# Patient Record
Sex: Female | Born: 1945 | Race: White | Hispanic: No | Smoking: Former smoker
Health system: Southern US, Community
[De-identification: ages and names within clinical notes are randomized; demographics above are authoritative.]

## PROBLEM LIST (undated history)

## (undated) DIAGNOSIS — R112 Nausea with vomiting, unspecified: Secondary | ICD-10-CM

## (undated) DIAGNOSIS — I10 Essential (primary) hypertension: Secondary | ICD-10-CM

## (undated) DIAGNOSIS — G4733 Obstructive sleep apnea (adult) (pediatric): Secondary | ICD-10-CM

## (undated) DIAGNOSIS — C50919 Malignant neoplasm of unspecified site of unspecified female breast: Secondary | ICD-10-CM

## (undated) DIAGNOSIS — I340 Nonrheumatic mitral (valve) insufficiency: Secondary | ICD-10-CM

## (undated) DIAGNOSIS — I499 Cardiac arrhythmia, unspecified: Secondary | ICD-10-CM

## (undated) DIAGNOSIS — K922 Gastrointestinal hemorrhage, unspecified: Secondary | ICD-10-CM

## (undated) DIAGNOSIS — E7800 Pure hypercholesterolemia, unspecified: Secondary | ICD-10-CM

## (undated) DIAGNOSIS — I4819 Other persistent atrial fibrillation: Secondary | ICD-10-CM

## (undated) DIAGNOSIS — M5136 Other intervertebral disc degeneration, lumbar region: Secondary | ICD-10-CM

## (undated) DIAGNOSIS — M51369 Other intervertebral disc degeneration, lumbar region without mention of lumbar back pain or lower extremity pain: Secondary | ICD-10-CM

## (undated) DIAGNOSIS — Z9889 Other specified postprocedural states: Secondary | ICD-10-CM

## (undated) DIAGNOSIS — G4700 Insomnia, unspecified: Secondary | ICD-10-CM

## (undated) DIAGNOSIS — M1990 Unspecified osteoarthritis, unspecified site: Secondary | ICD-10-CM

## (undated) DIAGNOSIS — K5730 Diverticulosis of large intestine without perforation or abscess without bleeding: Secondary | ICD-10-CM

## (undated) DIAGNOSIS — K219 Gastro-esophageal reflux disease without esophagitis: Secondary | ICD-10-CM

## (undated) DIAGNOSIS — E8881 Metabolic syndrome: Secondary | ICD-10-CM

## (undated) DIAGNOSIS — I350 Nonrheumatic aortic (valve) stenosis: Secondary | ICD-10-CM

## (undated) DIAGNOSIS — I6529 Occlusion and stenosis of unspecified carotid artery: Secondary | ICD-10-CM

## (undated) DIAGNOSIS — M545 Low back pain, unspecified: Secondary | ICD-10-CM

## (undated) DIAGNOSIS — D563 Thalassemia minor: Secondary | ICD-10-CM

## (undated) DIAGNOSIS — M2040 Other hammer toe(s) (acquired), unspecified foot: Secondary | ICD-10-CM

## (undated) DIAGNOSIS — Z923 Personal history of irradiation: Secondary | ICD-10-CM

## (undated) DIAGNOSIS — Z9989 Dependence on other enabling machines and devices: Secondary | ICD-10-CM

## (undated) DIAGNOSIS — D649 Anemia, unspecified: Secondary | ICD-10-CM

## (undated) DIAGNOSIS — E236 Other disorders of pituitary gland: Secondary | ICD-10-CM

## (undated) DIAGNOSIS — M8580 Other specified disorders of bone density and structure, unspecified site: Secondary | ICD-10-CM

## (undated) DIAGNOSIS — E559 Vitamin D deficiency, unspecified: Secondary | ICD-10-CM

## (undated) DIAGNOSIS — K589 Irritable bowel syndrome without diarrhea: Secondary | ICD-10-CM

## (undated) DIAGNOSIS — J189 Pneumonia, unspecified organism: Secondary | ICD-10-CM

## (undated) DIAGNOSIS — R5383 Other fatigue: Secondary | ICD-10-CM

## (undated) HISTORY — DX: Diverticulosis of large intestine without perforation or abscess without bleeding: K57.30

## (undated) HISTORY — DX: Other persistent atrial fibrillation: I48.19

## (undated) HISTORY — DX: Thalassemia minor: D56.3

## (undated) HISTORY — DX: Insomnia, unspecified: G47.00

## (undated) HISTORY — DX: Morbid (severe) obesity due to excess calories: E66.01

## (undated) HISTORY — PX: TUBAL LIGATION: SHX77

## (undated) HISTORY — PX: OOPHORECTOMY: SHX86

## (undated) HISTORY — DX: Occlusion and stenosis of unspecified carotid artery: I65.29

## (undated) HISTORY — DX: Low back pain, unspecified: M54.50

## (undated) HISTORY — DX: Pure hypercholesterolemia, unspecified: E78.00

## (undated) HISTORY — DX: Obstructive sleep apnea (adult) (pediatric): G47.33

## (undated) HISTORY — DX: Other fatigue: R53.83

## (undated) HISTORY — DX: Nonrheumatic mitral (valve) insufficiency: I34.0

## (undated) HISTORY — DX: Metabolic syndrome: E88.810

## (undated) HISTORY — DX: Gastrointestinal hemorrhage, unspecified: K92.2

## (undated) HISTORY — DX: Low back pain: M54.5

## (undated) HISTORY — DX: Vitamin D deficiency, unspecified: E55.9

## (undated) HISTORY — DX: Other disorders of pituitary gland: E23.6

## (undated) HISTORY — PX: TONSILLECTOMY: SUR1361

## (undated) HISTORY — PX: DILATION AND CURETTAGE OF UTERUS: SHX78

## (undated) HISTORY — DX: Other hammer toe(s) (acquired), unspecified foot: M20.40

## (undated) HISTORY — DX: Metabolic syndrome: E88.81

## (undated) HISTORY — PX: REPLACEMENT TOTAL KNEE: SUR1224

## (undated) HISTORY — DX: Dependence on other enabling machines and devices: Z99.89

## (undated) HISTORY — DX: Nonrheumatic aortic (valve) stenosis: I35.0

## (undated) HISTORY — DX: Other specified disorders of bone density and structure, unspecified site: M85.80

---

## 1968-08-25 HISTORY — PX: CYSTECTOMY: SUR359

## 1998-03-14 ENCOUNTER — Ambulatory Visit (HOSPITAL_COMMUNITY): Admission: RE | Admit: 1998-03-14 | Discharge: 1998-03-14 | Payer: Self-pay | Admitting: Obstetrics and Gynecology

## 1998-07-08 ENCOUNTER — Emergency Department (HOSPITAL_COMMUNITY): Admission: EM | Admit: 1998-07-08 | Discharge: 1998-07-08 | Payer: Self-pay | Admitting: Emergency Medicine

## 1998-07-09 ENCOUNTER — Encounter (HOSPITAL_COMMUNITY): Admission: RE | Admit: 1998-07-09 | Discharge: 1998-10-07 | Payer: Self-pay | Admitting: Psychiatry

## 1998-12-03 ENCOUNTER — Ambulatory Visit (HOSPITAL_COMMUNITY): Admission: RE | Admit: 1998-12-03 | Discharge: 1998-12-03 | Payer: Self-pay | Admitting: Internal Medicine

## 1998-12-03 ENCOUNTER — Encounter: Payer: Self-pay | Admitting: Internal Medicine

## 1999-03-12 ENCOUNTER — Other Ambulatory Visit: Admission: RE | Admit: 1999-03-12 | Discharge: 1999-03-12 | Payer: Self-pay | Admitting: Obstetrics and Gynecology

## 1999-03-18 ENCOUNTER — Encounter: Payer: Self-pay | Admitting: Obstetrics and Gynecology

## 1999-03-18 ENCOUNTER — Ambulatory Visit (HOSPITAL_COMMUNITY): Admission: RE | Admit: 1999-03-18 | Discharge: 1999-03-18 | Payer: Self-pay | Admitting: Obstetrics and Gynecology

## 1999-06-24 ENCOUNTER — Encounter: Admission: RE | Admit: 1999-06-24 | Discharge: 1999-06-24 | Payer: Self-pay | Admitting: Internal Medicine

## 1999-06-24 ENCOUNTER — Encounter: Payer: Self-pay | Admitting: Internal Medicine

## 1999-07-19 ENCOUNTER — Ambulatory Visit (HOSPITAL_COMMUNITY): Admission: RE | Admit: 1999-07-19 | Discharge: 1999-07-19 | Payer: Self-pay | Admitting: Orthopaedic Surgery

## 1999-07-19 ENCOUNTER — Encounter: Payer: Self-pay | Admitting: Orthopaedic Surgery

## 2000-03-11 ENCOUNTER — Other Ambulatory Visit: Admission: RE | Admit: 2000-03-11 | Discharge: 2000-03-11 | Payer: Self-pay | Admitting: Obstetrics and Gynecology

## 2000-03-19 ENCOUNTER — Encounter: Payer: Self-pay | Admitting: Obstetrics and Gynecology

## 2000-03-19 ENCOUNTER — Ambulatory Visit (HOSPITAL_COMMUNITY): Admission: RE | Admit: 2000-03-19 | Discharge: 2000-03-19 | Payer: Self-pay | Admitting: Obstetrics and Gynecology

## 2000-11-02 ENCOUNTER — Encounter: Payer: Self-pay | Admitting: Internal Medicine

## 2000-11-02 ENCOUNTER — Inpatient Hospital Stay (HOSPITAL_COMMUNITY): Admission: AD | Admit: 2000-11-02 | Discharge: 2000-11-04 | Payer: Self-pay | Admitting: Internal Medicine

## 2001-03-11 ENCOUNTER — Other Ambulatory Visit: Admission: RE | Admit: 2001-03-11 | Discharge: 2001-03-11 | Payer: Self-pay | Admitting: Obstetrics and Gynecology

## 2001-03-22 ENCOUNTER — Ambulatory Visit (HOSPITAL_COMMUNITY): Admission: RE | Admit: 2001-03-22 | Discharge: 2001-03-22 | Payer: Self-pay | Admitting: Obstetrics and Gynecology

## 2001-03-22 ENCOUNTER — Encounter: Payer: Self-pay | Admitting: Obstetrics and Gynecology

## 2002-03-23 ENCOUNTER — Ambulatory Visit (HOSPITAL_COMMUNITY): Admission: RE | Admit: 2002-03-23 | Discharge: 2002-03-23 | Payer: Self-pay | Admitting: Obstetrics and Gynecology

## 2002-03-23 ENCOUNTER — Encounter: Payer: Self-pay | Admitting: Obstetrics and Gynecology

## 2002-03-25 ENCOUNTER — Other Ambulatory Visit: Admission: RE | Admit: 2002-03-25 | Discharge: 2002-03-25 | Payer: Self-pay | Admitting: Obstetrics and Gynecology

## 2002-10-31 ENCOUNTER — Other Ambulatory Visit: Admission: RE | Admit: 2002-10-31 | Discharge: 2002-10-31 | Payer: Self-pay | Admitting: Obstetrics and Gynecology

## 2003-03-23 ENCOUNTER — Encounter: Payer: Self-pay | Admitting: Internal Medicine

## 2003-03-23 ENCOUNTER — Encounter: Admission: RE | Admit: 2003-03-23 | Discharge: 2003-03-23 | Payer: Self-pay | Admitting: Internal Medicine

## 2003-03-24 ENCOUNTER — Encounter: Payer: Self-pay | Admitting: Internal Medicine

## 2003-03-24 ENCOUNTER — Encounter: Admission: RE | Admit: 2003-03-24 | Discharge: 2003-03-24 | Payer: Self-pay | Admitting: Internal Medicine

## 2003-03-28 ENCOUNTER — Encounter: Payer: Self-pay | Admitting: Obstetrics and Gynecology

## 2003-03-28 ENCOUNTER — Ambulatory Visit (HOSPITAL_COMMUNITY): Admission: RE | Admit: 2003-03-28 | Discharge: 2003-03-28 | Payer: Self-pay | Admitting: Obstetrics and Gynecology

## 2003-04-13 ENCOUNTER — Encounter: Payer: Self-pay | Admitting: Internal Medicine

## 2003-04-13 ENCOUNTER — Encounter: Admission: RE | Admit: 2003-04-13 | Discharge: 2003-04-13 | Payer: Self-pay | Admitting: Internal Medicine

## 2003-04-21 ENCOUNTER — Other Ambulatory Visit: Admission: RE | Admit: 2003-04-21 | Discharge: 2003-04-21 | Payer: Self-pay | Admitting: Obstetrics and Gynecology

## 2003-05-03 ENCOUNTER — Encounter (INDEPENDENT_AMBULATORY_CARE_PROVIDER_SITE_OTHER): Payer: Self-pay | Admitting: Specialist

## 2003-05-03 ENCOUNTER — Ambulatory Visit (HOSPITAL_COMMUNITY): Admission: RE | Admit: 2003-05-03 | Discharge: 2003-05-03 | Payer: Self-pay | Admitting: Gastroenterology

## 2004-03-28 ENCOUNTER — Ambulatory Visit (HOSPITAL_COMMUNITY): Admission: RE | Admit: 2004-03-28 | Discharge: 2004-03-28 | Payer: Self-pay | Admitting: Obstetrics and Gynecology

## 2004-10-22 ENCOUNTER — Encounter: Admission: RE | Admit: 2004-10-22 | Discharge: 2004-10-22 | Payer: Self-pay | Admitting: Internal Medicine

## 2005-03-31 ENCOUNTER — Ambulatory Visit (HOSPITAL_COMMUNITY): Admission: RE | Admit: 2005-03-31 | Discharge: 2005-03-31 | Payer: Self-pay | Admitting: Internal Medicine

## 2006-04-02 ENCOUNTER — Ambulatory Visit (HOSPITAL_COMMUNITY): Admission: RE | Admit: 2006-04-02 | Discharge: 2006-04-02 | Payer: Self-pay | Admitting: Obstetrics and Gynecology

## 2007-04-05 ENCOUNTER — Ambulatory Visit (HOSPITAL_COMMUNITY): Admission: RE | Admit: 2007-04-05 | Discharge: 2007-04-05 | Payer: Self-pay | Admitting: Obstetrics and Gynecology

## 2007-10-22 ENCOUNTER — Encounter: Admission: RE | Admit: 2007-10-22 | Discharge: 2007-10-22 | Payer: Self-pay | Admitting: Internal Medicine

## 2008-04-26 ENCOUNTER — Ambulatory Visit (HOSPITAL_COMMUNITY): Admission: RE | Admit: 2008-04-26 | Discharge: 2008-04-26 | Payer: Self-pay | Admitting: Internal Medicine

## 2008-05-13 ENCOUNTER — Ambulatory Visit: Payer: Self-pay | Admitting: *Deleted

## 2008-05-13 ENCOUNTER — Observation Stay (HOSPITAL_COMMUNITY): Admission: EM | Admit: 2008-05-13 | Discharge: 2008-05-15 | Payer: Self-pay | Admitting: Emergency Medicine

## 2008-08-25 HISTORY — PX: BREAST SURGERY: SHX581

## 2008-09-21 ENCOUNTER — Other Ambulatory Visit: Admission: RE | Admit: 2008-09-21 | Discharge: 2008-09-21 | Payer: Self-pay | Admitting: Internal Medicine

## 2009-04-27 ENCOUNTER — Ambulatory Visit (HOSPITAL_COMMUNITY): Admission: RE | Admit: 2009-04-27 | Discharge: 2009-04-27 | Payer: Self-pay | Admitting: Internal Medicine

## 2009-05-03 ENCOUNTER — Encounter (INDEPENDENT_AMBULATORY_CARE_PROVIDER_SITE_OTHER): Payer: Self-pay | Admitting: Diagnostic Radiology

## 2009-05-03 ENCOUNTER — Encounter: Admission: RE | Admit: 2009-05-03 | Discharge: 2009-05-03 | Payer: Self-pay | Admitting: Internal Medicine

## 2009-05-11 ENCOUNTER — Encounter: Admission: RE | Admit: 2009-05-11 | Discharge: 2009-05-11 | Payer: Self-pay | Admitting: Internal Medicine

## 2009-05-15 ENCOUNTER — Ambulatory Visit: Admission: RE | Admit: 2009-05-15 | Discharge: 2009-06-25 | Payer: Self-pay | Admitting: Radiation Oncology

## 2009-05-24 ENCOUNTER — Encounter: Admission: RE | Admit: 2009-05-24 | Discharge: 2009-05-24 | Payer: Self-pay | Admitting: General Surgery

## 2009-05-28 ENCOUNTER — Encounter: Admission: RE | Admit: 2009-05-28 | Discharge: 2009-05-28 | Payer: Self-pay | Admitting: General Surgery

## 2009-05-28 ENCOUNTER — Encounter (INDEPENDENT_AMBULATORY_CARE_PROVIDER_SITE_OTHER): Payer: Self-pay | Admitting: General Surgery

## 2009-05-28 ENCOUNTER — Ambulatory Visit (HOSPITAL_BASED_OUTPATIENT_CLINIC_OR_DEPARTMENT_OTHER): Admission: RE | Admit: 2009-05-28 | Discharge: 2009-05-28 | Payer: Self-pay | Admitting: General Surgery

## 2009-05-28 HISTORY — PX: BREAST LUMPECTOMY: SHX2

## 2009-06-13 ENCOUNTER — Ambulatory Visit: Payer: Self-pay | Admitting: Oncology

## 2009-06-13 LAB — CBC WITH DIFFERENTIAL/PLATELET
BASO%: 0.7 % (ref 0.0–2.0)
Basophils Absolute: 0 10*3/uL (ref 0.0–0.1)
EOS%: 2.2 % (ref 0.0–7.0)
Eosinophils Absolute: 0.1 10*3/uL (ref 0.0–0.5)
HCT: 33.4 % — ABNORMAL LOW (ref 34.8–46.6)
HGB: 10.5 g/dL — ABNORMAL LOW (ref 11.6–15.9)
LYMPH%: 21.1 % (ref 14.0–49.7)
MCH: 19.7 pg — ABNORMAL LOW (ref 25.1–34.0)
MCHC: 31.5 g/dL (ref 31.5–36.0)
MCV: 62.5 fL — ABNORMAL LOW (ref 79.5–101.0)
MONO#: 0.3 10*3/uL (ref 0.1–0.9)
MONO%: 6 % (ref 0.0–14.0)
NEUT#: 4.1 10*3/uL (ref 1.5–6.5)
NEUT%: 70 % (ref 38.4–76.8)
Platelets: 219 10*3/uL (ref 145–400)
RBC: 5.35 10*6/uL (ref 3.70–5.45)
RDW: 16.1 % — ABNORMAL HIGH (ref 11.2–14.5)
WBC: 5.8 10*3/uL (ref 3.9–10.3)
lymph#: 1.2 10*3/uL (ref 0.9–3.3)

## 2009-06-13 LAB — COMPREHENSIVE METABOLIC PANEL
ALT: 14 U/L (ref 0–35)
AST: 11 U/L (ref 0–37)
Albumin: 4.5 g/dL (ref 3.5–5.2)
Alkaline Phosphatase: 85 U/L (ref 39–117)
BUN: 20 mg/dL (ref 6–23)
CO2: 23 mEq/L (ref 19–32)
Calcium: 9.2 mg/dL (ref 8.4–10.5)
Chloride: 105 mEq/L (ref 96–112)
Creatinine, Ser: 0.82 mg/dL (ref 0.40–1.20)
Glucose, Bld: 208 mg/dL — ABNORMAL HIGH (ref 70–99)
Potassium: 3.9 mEq/L (ref 3.5–5.3)
Sodium: 140 mEq/L (ref 135–145)
Total Bilirubin: 0.6 mg/dL (ref 0.3–1.2)
Total Protein: 7.1 g/dL (ref 6.0–8.3)

## 2009-06-13 LAB — LACTATE DEHYDROGENASE: LDH: 135 U/L (ref 94–250)

## 2009-06-13 LAB — CANCER ANTIGEN 27.29: CA 27.29: 25 U/mL (ref 0–39)

## 2009-06-13 LAB — VITAMIN D 25 HYDROXY (VIT D DEFICIENCY, FRACTURES): Vit D, 25-Hydroxy: 33 ng/mL (ref 30–89)

## 2009-06-15 ENCOUNTER — Ambulatory Visit (HOSPITAL_COMMUNITY): Admission: RE | Admit: 2009-06-15 | Discharge: 2009-06-15 | Payer: Self-pay | Admitting: Oncology

## 2009-07-16 ENCOUNTER — Ambulatory Visit: Payer: Self-pay | Admitting: Oncology

## 2009-07-20 LAB — MORPHOLOGY: PLT EST: ADEQUATE

## 2009-07-20 LAB — CBC & DIFF AND RETIC
BASO%: 0.6 % (ref 0.0–2.0)
Basophils Absolute: 0 10*3/uL (ref 0.0–0.1)
EOS%: 3.5 % (ref 0.0–7.0)
Eosinophils Absolute: 0.2 10*3/uL (ref 0.0–0.5)
HCT: 35.5 % (ref 34.8–46.6)
HGB: 10.9 g/dL — ABNORMAL LOW (ref 11.6–15.9)
Immature Retic Fract: 8.1 % (ref 0.00–10.70)
LYMPH%: 22.5 % (ref 14.0–49.7)
MCH: 19.3 pg — ABNORMAL LOW (ref 25.1–34.0)
MCHC: 30.7 g/dL — ABNORMAL LOW (ref 31.5–36.0)
MCV: 62.8 fL — ABNORMAL LOW (ref 79.5–101.0)
MONO#: 0.3 10*3/uL (ref 0.1–0.9)
MONO%: 4.3 % (ref 0.0–14.0)
NEUT#: 4.8 10*3/uL (ref 1.5–6.5)
NEUT%: 69.1 % (ref 38.4–76.8)
Platelets: 198 10*3/uL (ref 145–400)
RBC: 5.65 10*6/uL — ABNORMAL HIGH (ref 3.70–5.45)
RDW: 15.5 % — ABNORMAL HIGH (ref 11.2–14.5)
Retic %: 1.7 % — ABNORMAL HIGH (ref 0.50–1.50)
Retic Ct Abs: 96.05 10*3/uL — ABNORMAL HIGH (ref 18.30–72.70)
WBC: 6.9 10*3/uL (ref 3.9–10.3)
lymph#: 1.6 10*3/uL (ref 0.9–3.3)

## 2009-07-20 LAB — CHCC SMEAR

## 2009-07-24 LAB — HEMOGLOBINOPATHY EVALUATION
Hemoglobin Other: 0 % (ref 0.0–0.0)
Hgb A2 Quant: 5.4 % — ABNORMAL HIGH (ref 2.2–3.2)
Hgb A: 94.6 % — ABNORMAL LOW (ref 96.8–97.8)
Hgb F Quant: 0 % (ref 0.0–2.0)
Hgb S Quant: 0 % (ref 0.0–0.0)

## 2009-07-24 LAB — IRON AND TIBC
%SAT: 26 % (ref 20–55)
Iron: 87 ug/dL (ref 42–145)
TIBC: 336 ug/dL (ref 250–470)
UIBC: 249 ug/dL

## 2009-07-24 LAB — FERRITIN: Ferritin: 185 ng/mL (ref 10–291)

## 2009-08-13 ENCOUNTER — Ambulatory Visit (HOSPITAL_COMMUNITY): Admission: RE | Admit: 2009-08-13 | Discharge: 2009-08-13 | Payer: Self-pay | Admitting: Oncology

## 2009-08-15 ENCOUNTER — Ambulatory Visit: Payer: Self-pay | Admitting: Oncology

## 2009-08-20 LAB — CBC & DIFF AND RETIC
BASO%: 0.6 % (ref 0.0–2.0)
Basophils Absolute: 0 10*3/uL (ref 0.0–0.1)
EOS%: 6.8 % (ref 0.0–7.0)
Eosinophils Absolute: 0.5 10*3/uL (ref 0.0–0.5)
HCT: 35.3 % (ref 34.8–46.6)
HGB: 10.8 g/dL — ABNORMAL LOW (ref 11.6–15.9)
Immature Retic Fract: 12.2 % — ABNORMAL HIGH (ref 0.00–10.70)
LYMPH%: 27.1 % (ref 14.0–49.7)
MCH: 19.1 pg — ABNORMAL LOW (ref 25.1–34.0)
MCHC: 30.6 g/dL — ABNORMAL LOW (ref 31.5–36.0)
MCV: 62.4 fL — ABNORMAL LOW (ref 79.5–101.0)
MONO#: 0.3 10*3/uL (ref 0.1–0.9)
MONO%: 3.9 % (ref 0.0–14.0)
NEUT#: 4.1 10*3/uL (ref 1.5–6.5)
NEUT%: 61.6 % (ref 38.4–76.8)
Platelets: 195 10*3/uL (ref 145–400)
RBC: 5.66 10*6/uL — ABNORMAL HIGH (ref 3.70–5.45)
RDW: 16.1 % — ABNORMAL HIGH (ref 11.2–14.5)
Retic %: 1.87 % — ABNORMAL HIGH (ref 0.50–1.50)
Retic Ct Abs: 105.84 10*3/uL — ABNORMAL HIGH (ref 18.30–72.70)
WBC: 6.6 10*3/uL (ref 3.9–10.3)
lymph#: 1.8 10*3/uL (ref 0.9–3.3)

## 2009-08-21 LAB — VITAMIN B12: Vitamin B-12: 933 pg/mL — ABNORMAL HIGH (ref 211–911)

## 2009-08-21 LAB — TSH: TSH: 1.709 u[IU]/mL (ref 0.350–4.500)

## 2009-08-25 HISTORY — PX: REPLACEMENT TOTAL KNEE: SUR1224

## 2009-09-27 ENCOUNTER — Other Ambulatory Visit: Admission: RE | Admit: 2009-09-27 | Discharge: 2009-09-27 | Payer: Self-pay | Admitting: Internal Medicine

## 2009-10-06 ENCOUNTER — Encounter: Admission: RE | Admit: 2009-10-06 | Discharge: 2009-10-06 | Payer: Self-pay | Admitting: Neurosurgery

## 2009-11-16 ENCOUNTER — Ambulatory Visit: Payer: Self-pay | Admitting: Oncology

## 2009-11-20 LAB — COMPREHENSIVE METABOLIC PANEL
ALT: 34 U/L (ref 0–35)
AST: 23 U/L (ref 0–37)
Albumin: 4.5 g/dL (ref 3.5–5.2)
Alkaline Phosphatase: 128 U/L — ABNORMAL HIGH (ref 39–117)
BUN: 19 mg/dL (ref 6–23)
CO2: 20 mEq/L (ref 19–32)
Calcium: 10.4 mg/dL (ref 8.4–10.5)
Chloride: 100 mEq/L (ref 96–112)
Creatinine, Ser: 0.92 mg/dL (ref 0.40–1.20)
Glucose, Bld: 249 mg/dL — ABNORMAL HIGH (ref 70–99)
Potassium: 4.4 mEq/L (ref 3.5–5.3)
Sodium: 137 mEq/L (ref 135–145)
Total Bilirubin: 0.6 mg/dL (ref 0.3–1.2)
Total Protein: 7.5 g/dL (ref 6.0–8.3)

## 2009-11-20 LAB — CBC WITH DIFFERENTIAL/PLATELET
BASO%: 0.4 % (ref 0.0–2.0)
Basophils Absolute: 0 10*3/uL (ref 0.0–0.1)
EOS%: 5.7 % (ref 0.0–7.0)
Eosinophils Absolute: 0.4 10*3/uL (ref 0.0–0.5)
HCT: 38.9 % (ref 34.8–46.6)
HGB: 12.1 g/dL (ref 11.6–15.9)
LYMPH%: 22.2 % (ref 14.0–49.7)
MCH: 19.5 pg — ABNORMAL LOW (ref 25.1–34.0)
MCHC: 31.1 g/dL — ABNORMAL LOW (ref 31.5–36.0)
MCV: 62.8 fL — ABNORMAL LOW (ref 79.5–101.0)
MONO#: 0.4 10*3/uL (ref 0.1–0.9)
MONO%: 5.3 % (ref 0.0–14.0)
NEUT#: 4.6 10*3/uL (ref 1.5–6.5)
NEUT%: 66.4 % (ref 38.4–76.8)
Platelets: 198 10*3/uL (ref 145–400)
RBC: 6.19 10*6/uL — ABNORMAL HIGH (ref 3.70–5.45)
RDW: 16 % — ABNORMAL HIGH (ref 11.2–14.5)
WBC: 6.9 10*3/uL (ref 3.9–10.3)
lymph#: 1.5 10*3/uL (ref 0.9–3.3)

## 2009-11-20 LAB — VITAMIN D 25 HYDROXY (VIT D DEFICIENCY, FRACTURES): Vit D, 25-Hydroxy: 50 ng/mL (ref 30–89)

## 2009-11-20 LAB — LACTATE DEHYDROGENASE: LDH: 154 U/L (ref 94–250)

## 2009-12-27 ENCOUNTER — Inpatient Hospital Stay (HOSPITAL_COMMUNITY): Admission: RE | Admit: 2009-12-27 | Discharge: 2009-12-31 | Payer: Self-pay | Admitting: Orthopaedic Surgery

## 2010-01-25 ENCOUNTER — Ambulatory Visit: Payer: Self-pay | Admitting: Oncology

## 2010-01-29 LAB — COMPREHENSIVE METABOLIC PANEL
ALT: 19 U/L (ref 0–35)
AST: 17 U/L (ref 0–37)
Albumin: 4.9 g/dL (ref 3.5–5.2)
Alkaline Phosphatase: 106 U/L (ref 39–117)
BUN: 19 mg/dL (ref 6–23)
CO2: 22 mEq/L (ref 19–32)
Calcium: 10.1 mg/dL (ref 8.4–10.5)
Chloride: 105 mEq/L (ref 96–112)
Creatinine, Ser: 0.82 mg/dL (ref 0.40–1.20)
Glucose, Bld: 111 mg/dL — ABNORMAL HIGH (ref 70–99)
Potassium: 4.3 mEq/L (ref 3.5–5.3)
Sodium: 140 mEq/L (ref 135–145)
Total Bilirubin: 0.5 mg/dL (ref 0.3–1.2)
Total Protein: 7.3 g/dL (ref 6.0–8.3)

## 2010-01-29 LAB — CANCER ANTIGEN 27.29: CA 27.29: 17 U/mL (ref 0–39)

## 2010-01-29 LAB — CBC WITH DIFFERENTIAL/PLATELET
BASO%: 0.3 % (ref 0.0–2.0)
Basophils Absolute: 0 10*3/uL (ref 0.0–0.1)
EOS%: 1.2 % (ref 0.0–7.0)
Eosinophils Absolute: 0.1 10*3/uL (ref 0.0–0.5)
HCT: 33 % — ABNORMAL LOW (ref 34.8–46.6)
HGB: 10.5 g/dL — ABNORMAL LOW (ref 11.6–15.9)
LYMPH%: 26 % (ref 14.0–49.7)
MCH: 19.9 pg — ABNORMAL LOW (ref 25.1–34.0)
MCHC: 31.7 g/dL (ref 31.5–36.0)
MCV: 62.7 fL — ABNORMAL LOW (ref 79.5–101.0)
MONO#: 0.4 10*3/uL (ref 0.1–0.9)
MONO%: 7.4 % (ref 0.0–14.0)
NEUT#: 3.5 10*3/uL (ref 1.5–6.5)
NEUT%: 65.1 % (ref 38.4–76.8)
Platelets: 233 10*3/uL (ref 145–400)
RBC: 5.26 10*6/uL (ref 3.70–5.45)
RDW: 16.7 % — ABNORMAL HIGH (ref 11.2–14.5)
WBC: 5.4 10*3/uL (ref 3.9–10.3)
lymph#: 1.4 10*3/uL (ref 0.9–3.3)

## 2010-01-29 LAB — LACTATE DEHYDROGENASE: LDH: 147 U/L (ref 94–250)

## 2010-01-29 LAB — VITAMIN D 25 HYDROXY (VIT D DEFICIENCY, FRACTURES): Vit D, 25-Hydroxy: 65 ng/mL (ref 30–89)

## 2010-04-30 ENCOUNTER — Encounter: Admission: RE | Admit: 2010-04-30 | Discharge: 2010-04-30 | Payer: Self-pay | Admitting: Oncology

## 2010-05-17 ENCOUNTER — Ambulatory Visit: Payer: Self-pay | Admitting: Oncology

## 2010-05-21 LAB — CBC WITH DIFFERENTIAL/PLATELET
BASO%: 0.4 % (ref 0.0–2.0)
Basophils Absolute: 0 10*3/uL (ref 0.0–0.1)
EOS%: 10.2 % — ABNORMAL HIGH (ref 0.0–7.0)
Eosinophils Absolute: 0.8 10*3/uL — ABNORMAL HIGH (ref 0.0–0.5)
HCT: 30.1 % — ABNORMAL LOW (ref 34.8–46.6)
HGB: 9.7 g/dL — ABNORMAL LOW (ref 11.6–15.9)
LYMPH%: 25.7 % (ref 14.0–49.7)
MCH: 19.5 pg — ABNORMAL LOW (ref 25.1–34.0)
MCHC: 32.3 g/dL (ref 31.5–36.0)
MCV: 60.3 fL — ABNORMAL LOW (ref 79.5–101.0)
MONO#: 0.4 10*3/uL (ref 0.1–0.9)
MONO%: 5.2 % (ref 0.0–14.0)
NEUT#: 4.3 10*3/uL (ref 1.5–6.5)
NEUT%: 58.5 % (ref 38.4–76.8)
Platelets: 262 10*3/uL (ref 145–400)
RBC: 4.99 10*6/uL (ref 3.70–5.45)
RDW: 16.2 % — ABNORMAL HIGH (ref 11.2–14.5)
WBC: 7.4 10*3/uL (ref 3.9–10.3)
lymph#: 1.9 10*3/uL (ref 0.9–3.3)

## 2010-05-21 LAB — COMPREHENSIVE METABOLIC PANEL
ALT: 17 U/L (ref 0–35)
AST: 17 U/L (ref 0–37)
Albumin: 4.1 g/dL (ref 3.5–5.2)
Alkaline Phosphatase: 74 U/L (ref 39–117)
BUN: 27 mg/dL — ABNORMAL HIGH (ref 6–23)
CO2: 26 mEq/L (ref 19–32)
Calcium: 9.8 mg/dL (ref 8.4–10.5)
Chloride: 109 mEq/L (ref 96–112)
Creatinine, Ser: 0.85 mg/dL (ref 0.40–1.20)
Glucose, Bld: 108 mg/dL — ABNORMAL HIGH (ref 70–99)
Potassium: 4.2 mEq/L (ref 3.5–5.3)
Sodium: 142 mEq/L (ref 135–145)
Total Bilirubin: 0.9 mg/dL (ref 0.3–1.2)
Total Protein: 7.4 g/dL (ref 6.0–8.3)

## 2010-05-21 LAB — CANCER ANTIGEN 27.29: CA 27.29: 17 U/mL (ref 0–39)

## 2010-05-21 LAB — VITAMIN D 25 HYDROXY (VIT D DEFICIENCY, FRACTURES): Vit D, 25-Hydroxy: 80 ng/mL (ref 30–89)

## 2010-05-21 LAB — LACTATE DEHYDROGENASE: LDH: 133 U/L (ref 94–250)

## 2010-09-16 ENCOUNTER — Encounter: Payer: Self-pay | Admitting: Internal Medicine

## 2010-10-17 ENCOUNTER — Other Ambulatory Visit: Payer: Self-pay | Admitting: Oncology

## 2010-10-17 ENCOUNTER — Encounter (HOSPITAL_BASED_OUTPATIENT_CLINIC_OR_DEPARTMENT_OTHER): Payer: BC Managed Care – PPO | Admitting: Oncology

## 2010-10-17 DIAGNOSIS — C50419 Malignant neoplasm of upper-outer quadrant of unspecified female breast: Secondary | ICD-10-CM

## 2010-10-17 LAB — COMPREHENSIVE METABOLIC PANEL
ALT: 13 U/L (ref 0–35)
AST: 14 U/L (ref 0–37)
Albumin: 3.6 g/dL (ref 3.5–5.2)
Alkaline Phosphatase: 59 U/L (ref 39–117)
BUN: 20 mg/dL (ref 6–23)
CO2: 28 mEq/L (ref 19–32)
Calcium: 8.8 mg/dL (ref 8.4–10.5)
Chloride: 106 mEq/L (ref 96–112)
Creatinine, Ser: 0.81 mg/dL (ref 0.40–1.20)
Glucose, Bld: 119 mg/dL — ABNORMAL HIGH (ref 70–99)
Potassium: 4.1 mEq/L (ref 3.5–5.3)
Sodium: 140 mEq/L (ref 135–145)
Total Bilirubin: 0.5 mg/dL (ref 0.3–1.2)
Total Protein: 6.5 g/dL (ref 6.0–8.3)

## 2010-10-17 LAB — CBC WITH DIFFERENTIAL/PLATELET
BASO%: 0.9 % (ref 0.0–2.0)
Basophils Absolute: 0.1 10*3/uL (ref 0.0–0.1)
EOS%: 7.2 % — ABNORMAL HIGH (ref 0.0–7.0)
Eosinophils Absolute: 0.4 10*3/uL (ref 0.0–0.5)
HCT: 32.7 % — ABNORMAL LOW (ref 34.8–46.6)
HGB: 10.3 g/dL — ABNORMAL LOW (ref 11.6–15.9)
LYMPH%: 34.5 % (ref 14.0–49.7)
MCH: 19.1 pg — ABNORMAL LOW (ref 25.1–34.0)
MCHC: 31.5 g/dL (ref 31.5–36.0)
MCV: 60.6 fL — ABNORMAL LOW (ref 79.5–101.0)
MONO#: 0.4 10*3/uL (ref 0.1–0.9)
MONO%: 7.3 % (ref 0.0–14.0)
NEUT#: 2.8 10*3/uL (ref 1.5–6.5)
NEUT%: 50.1 % (ref 38.4–76.8)
Platelets: 192 10*3/uL (ref 145–400)
RBC: 5.39 10*6/uL (ref 3.70–5.45)
RDW: 15.8 % — ABNORMAL HIGH (ref 11.2–14.5)
WBC: 5.6 10*3/uL (ref 3.9–10.3)
lymph#: 1.9 10*3/uL (ref 0.9–3.3)

## 2010-10-17 LAB — LACTATE DEHYDROGENASE: LDH: 130 U/L (ref 94–250)

## 2010-10-18 LAB — VITAMIN D 25 HYDROXY (VIT D DEFICIENCY, FRACTURES): Vit D, 25-Hydroxy: 52 ng/mL (ref 30–89)

## 2010-10-18 LAB — CANCER ANTIGEN 27.29: CA 27.29: 17 U/mL (ref 0–39)

## 2010-10-22 ENCOUNTER — Other Ambulatory Visit (HOSPITAL_COMMUNITY)
Admission: RE | Admit: 2010-10-22 | Discharge: 2010-10-22 | Disposition: A | Discharge status: Home / Self Care | Payer: Medicare Other | Source: Ambulatory Visit | Attending: Internal Medicine | Admitting: Internal Medicine

## 2010-10-22 ENCOUNTER — Other Ambulatory Visit: Payer: Self-pay | Admitting: Internal Medicine

## 2010-10-22 DIAGNOSIS — Z124 Encounter for screening for malignant neoplasm of cervix: Secondary | ICD-10-CM | POA: Insufficient documentation

## 2010-11-12 LAB — DIFFERENTIAL
Basophils Absolute: 0.1 10*3/uL (ref 0.0–0.1)
Basophils Relative: 1 % (ref 0–1)
Eosinophils Absolute: 0.1 10*3/uL (ref 0.0–0.7)
Eosinophils Relative: 2 % (ref 0–5)
Lymphocytes Relative: 26 % (ref 12–46)
Lymphs Abs: 1.4 10*3/uL (ref 0.7–4.0)
Monocytes Absolute: 0.4 10*3/uL (ref 0.1–1.0)
Monocytes Relative: 7 % (ref 3–12)
Neutro Abs: 3.5 10*3/uL (ref 1.7–7.7)
Neutrophils Relative %: 64 % (ref 43–77)

## 2010-11-12 LAB — URINALYSIS, ROUTINE W REFLEX MICROSCOPIC
Bilirubin Urine: NEGATIVE
Glucose, UA: NEGATIVE mg/dL
Hgb urine dipstick: NEGATIVE
Ketones, ur: NEGATIVE mg/dL
Nitrite: NEGATIVE
Protein, ur: NEGATIVE mg/dL
Specific Gravity, Urine: 1.028 (ref 1.005–1.030)
Urobilinogen, UA: 0.2 mg/dL (ref 0.0–1.0)
pH: 5 (ref 5.0–8.0)

## 2010-11-12 LAB — BASIC METABOLIC PANEL
BUN: 8 mg/dL (ref 6–23)
BUN: 18 mg/dL (ref 6–23)
CO2: 27 mEq/L (ref 19–32)
CO2: 27 mEq/L (ref 19–32)
Calcium: 9.3 mg/dL (ref 8.4–10.5)
Calcium: 10 mg/dL (ref 8.4–10.5)
Chloride: 103 mEq/L (ref 96–112)
Chloride: 105 mEq/L (ref 96–112)
Creatinine, Ser: 0.94 mg/dL (ref 0.4–1.2)
Creatinine, Ser: 0.92 mg/dL (ref 0.4–1.2)
GFR calc Af Amer: >60 mL/min (ref >60)
GFR calc Af Amer: >60 mL/min (ref >60)
GFR calc non Af Amer: 60 mL/min — ABNORMAL LOW (ref >60)
GFR calc non Af Amer: >60 mL/min (ref >60)
Glucose, Bld: 131 mg/dL — ABNORMAL HIGH (ref 70–99)
Glucose, Bld: 122 mg/dL — ABNORMAL HIGH (ref 70–99)
Potassium: 4.2 mEq/L (ref 3.5–5.1)
Potassium: 4.4 mEq/L (ref 3.5–5.1)
Sodium: 138 mEq/L (ref 135–145)
Sodium: 138 mEq/L (ref 135–145)

## 2010-11-12 LAB — PROTIME-INR
INR: 1.18 (ref 0.00–1.49)
INR: 1.34 (ref 0.00–1.49)
INR: 1.75 — ABNORMAL HIGH (ref 0.00–1.49)
INR: 1.86 — ABNORMAL HIGH (ref 0.00–1.49)
INR: 1.01 (ref 0.00–1.49)
Prothrombin Time: 14.9 seconds (ref 11.6–15.2)
Prothrombin Time: 16.5 seconds — ABNORMAL HIGH (ref 11.6–15.2)
Prothrombin Time: 20.3 seconds — ABNORMAL HIGH (ref 11.6–15.2)
Prothrombin Time: 21.3 seconds — ABNORMAL HIGH (ref 11.6–15.2)
Prothrombin Time: 13.2 seconds (ref 11.6–15.2)

## 2010-11-12 LAB — CBC
HCT: 28.1 % — ABNORMAL LOW (ref 36.0–46.0)
HCT: 28.2 % — ABNORMAL LOW (ref 36.0–46.0)
HCT: 28.6 % — ABNORMAL LOW (ref 36.0–46.0)
HCT: 36 % (ref 36.0–46.0)
Hemoglobin: 9 g/dL — ABNORMAL LOW (ref 12.0–15.0)
Hemoglobin: 9 g/dL — ABNORMAL LOW (ref 12.0–15.0)
Hemoglobin: 9.1 g/dL — ABNORMAL LOW (ref 12.0–15.0)
Hemoglobin: 11.5 g/dL — ABNORMAL LOW (ref 12.0–15.0)
MCHC: 31.9 g/dL (ref 30.0–36.0)
MCHC: 31.9 g/dL (ref 30.0–36.0)
MCHC: 32 g/dL (ref 30.0–36.0)
MCHC: 32 g/dL (ref 30.0–36.0)
MCV: 62.9 fL — ABNORMAL LOW (ref 78.0–100.0)
MCV: 63.4 fL — ABNORMAL LOW (ref 78.0–100.0)
MCV: 63.4 fL — ABNORMAL LOW (ref 78.0–100.0)
MCV: 62.9 fL — ABNORMAL LOW (ref 78.0–100.0)
Platelets: 159 10*3/uL (ref 150–400)
Platelets: 162 10*3/uL (ref 150–400)
Platelets: 168 10*3/uL (ref 150–400)
Platelets: 211 10*3/uL (ref 150–400)
RBC: 4.45 MIL/uL (ref 3.87–5.11)
RBC: 4.46 MIL/uL (ref 3.87–5.11)
RBC: 4.51 MIL/uL (ref 3.87–5.11)
RBC: 5.72 MIL/uL — ABNORMAL HIGH (ref 3.87–5.11)
RDW: 15.4 % (ref 11.5–15.5)
RDW: 15.7 % — ABNORMAL HIGH (ref 11.5–15.5)
RDW: 15.8 % — ABNORMAL HIGH (ref 11.5–15.5)
RDW: 15.8 % — ABNORMAL HIGH (ref 11.5–15.5)
WBC: 6.8 10*3/uL (ref 4.0–10.5)
WBC: 7.6 10*3/uL (ref 4.0–10.5)
WBC: 8 10*3/uL (ref 4.0–10.5)
WBC: 5.5 10*3/uL (ref 4.0–10.5)

## 2010-11-12 LAB — GLUCOSE, CAPILLARY
Glucose-Capillary: 113 mg/dL — ABNORMAL HIGH (ref 70–99)
Glucose-Capillary: 117 mg/dL — ABNORMAL HIGH (ref 70–99)
Glucose-Capillary: 120 mg/dL — ABNORMAL HIGH (ref 70–99)
Glucose-Capillary: 133 mg/dL — ABNORMAL HIGH (ref 70–99)
Glucose-Capillary: 137 mg/dL — ABNORMAL HIGH (ref 70–99)
Glucose-Capillary: 137 mg/dL — ABNORMAL HIGH (ref 70–99)
Glucose-Capillary: 141 mg/dL — ABNORMAL HIGH (ref 70–99)
Glucose-Capillary: 142 mg/dL — ABNORMAL HIGH (ref 70–99)
Glucose-Capillary: 143 mg/dL — ABNORMAL HIGH (ref 70–99)
Glucose-Capillary: 143 mg/dL — ABNORMAL HIGH (ref 70–99)
Glucose-Capillary: 154 mg/dL — ABNORMAL HIGH (ref 70–99)
Glucose-Capillary: 160 mg/dL — ABNORMAL HIGH (ref 70–99)
Glucose-Capillary: 163 mg/dL — ABNORMAL HIGH (ref 70–99)
Glucose-Capillary: 170 mg/dL — ABNORMAL HIGH (ref 70–99)
Glucose-Capillary: 196 mg/dL — ABNORMAL HIGH (ref 70–99)
Glucose-Capillary: 206 mg/dL — ABNORMAL HIGH (ref 70–99)
Glucose-Capillary: 218 mg/dL — ABNORMAL HIGH (ref 70–99)
Glucose-Capillary: 235 mg/dL — ABNORMAL HIGH (ref 70–99)

## 2010-11-12 LAB — HEMOGLOBIN A1C
Hgb A1c MFr Bld: 9.3 % — ABNORMAL HIGH (ref <5.7)
Mean Plasma Glucose: 220 mg/dL — ABNORMAL HIGH (ref <117)

## 2010-11-12 LAB — URINE MICROSCOPIC-ADD ON

## 2010-11-12 LAB — APTT: aPTT: 29 seconds (ref 24–37)

## 2010-11-12 LAB — ABO/RH: ABO/RH(D): A POS

## 2010-11-12 LAB — TYPE AND SCREEN
ABO/RH(D): A POS
Antibody Screen: NEGATIVE

## 2010-11-26 ENCOUNTER — Encounter (HOSPITAL_BASED_OUTPATIENT_CLINIC_OR_DEPARTMENT_OTHER): Payer: BC Managed Care – PPO | Admitting: Oncology

## 2010-11-26 DIAGNOSIS — R197 Diarrhea, unspecified: Secondary | ICD-10-CM

## 2010-11-26 DIAGNOSIS — C50419 Malignant neoplasm of upper-outer quadrant of unspecified female breast: Secondary | ICD-10-CM

## 2010-11-26 DIAGNOSIS — D568 Other thalassemias: Secondary | ICD-10-CM

## 2010-11-29 LAB — CANCER ANTIGEN 27.29: CA 27.29: 27 U/mL (ref 0–39)

## 2010-11-29 LAB — URINALYSIS, ROUTINE W REFLEX MICROSCOPIC
Bilirubin Urine: NEGATIVE
Glucose, UA: NEGATIVE mg/dL
Hgb urine dipstick: NEGATIVE
Ketones, ur: NEGATIVE mg/dL
Nitrite: NEGATIVE
Protein, ur: NEGATIVE mg/dL
Specific Gravity, Urine: 1.028 (ref 1.005–1.030)
Urobilinogen, UA: 0.2 mg/dL (ref 0.0–1.0)
pH: 5 (ref 5.0–8.0)

## 2010-11-29 LAB — URINE MICROSCOPIC-ADD ON

## 2010-11-29 LAB — DIFFERENTIAL
Basophils Absolute: 0.1 10*3/uL (ref 0.0–0.1)
Basophils Relative: 1 % (ref 0–1)
Eosinophils Absolute: 0.3 10*3/uL (ref 0.0–0.7)
Eosinophils Relative: 5 % (ref 0–5)
Lymphocytes Relative: 38 % (ref 12–46)
Lymphs Abs: 2.4 10*3/uL (ref 0.7–4.0)
Monocytes Absolute: 0.4 10*3/uL (ref 0.1–1.0)
Monocytes Relative: 6 % (ref 3–12)
Neutro Abs: 3 10*3/uL (ref 1.7–7.7)
Neutrophils Relative %: 50 % (ref 43–77)

## 2010-11-29 LAB — COMPREHENSIVE METABOLIC PANEL
ALT: 21 U/L (ref 0–35)
AST: 29 U/L (ref 0–37)
Albumin: 4.3 g/dL (ref 3.5–5.2)
Alkaline Phosphatase: 92 U/L (ref 39–117)
BUN: 19 mg/dL (ref 6–23)
CO2: 24 mEq/L (ref 19–32)
Calcium: 9.6 mg/dL (ref 8.4–10.5)
Chloride: 105 mEq/L (ref 96–112)
Creatinine, Ser: 0.71 mg/dL (ref 0.4–1.2)
GFR calc Af Amer: >60 mL/min (ref >60)
GFR calc non Af Amer: >60 mL/min (ref >60)
Glucose, Bld: 198 mg/dL — ABNORMAL HIGH (ref 70–99)
Potassium: 4.1 mEq/L (ref 3.5–5.1)
Sodium: 139 mEq/L (ref 135–145)
Total Bilirubin: 0.4 mg/dL (ref 0.3–1.2)
Total Protein: 7.1 g/dL (ref 6.0–8.3)

## 2010-11-29 LAB — CBC
HCT: 35 % — ABNORMAL LOW (ref 36.0–46.0)
Hemoglobin: 11.2 g/dL — ABNORMAL LOW (ref 12.0–15.0)
MCHC: 31.9 g/dL (ref 30.0–36.0)
MCV: 63.8 fL — ABNORMAL LOW (ref 78.0–100.0)
Platelets: 200 10*3/uL (ref 150–400)
RBC: 5.49 MIL/uL — ABNORMAL HIGH (ref 3.87–5.11)
RDW: 16.1 % — ABNORMAL HIGH (ref 11.5–15.5)
WBC: 6.2 10*3/uL (ref 4.0–10.5)

## 2010-11-29 LAB — LACTATE DEHYDROGENASE: LDH: 161 U/L (ref 94–250)

## 2011-01-03 ENCOUNTER — Encounter (HOSPITAL_BASED_OUTPATIENT_CLINIC_OR_DEPARTMENT_OTHER): Payer: BC Managed Care – PPO | Admitting: Oncology

## 2011-01-03 ENCOUNTER — Other Ambulatory Visit: Payer: Self-pay | Admitting: Oncology

## 2011-01-03 DIAGNOSIS — N63 Unspecified lump in unspecified breast: Secondary | ICD-10-CM

## 2011-01-03 DIAGNOSIS — D568 Other thalassemias: Secondary | ICD-10-CM

## 2011-01-03 DIAGNOSIS — C50419 Malignant neoplasm of upper-outer quadrant of unspecified female breast: Secondary | ICD-10-CM

## 2011-01-07 NOTE — H&P (Signed)
Jenna Mcdonald, PERHAM NO.:  [id number]   MEDICAL RECORD NO.:  [id number]          PATIENT TYPE:  INP   LOCATION:  3703                         FACILITY:  [facility]   PHYSICIAN:  Lucita Ferrara, MD         DATE OF BIRTH:  1946/04/15   DATE OF ADMISSION:  05/13/2008  DATE OF DISCHARGE:                              HISTORY & PHYSICAL   STUDIES:  EKG:  It looks like she has sinus tachycardia with nonspecific  ST-T wave changes.  I do think that based on these results, we are going  to go ahead and repeat EKG in the morning, in addition, set her up for  stress test here.  Follow up with cardiologist as outpatient.  In  discussion with the patient, seems like she prefers to follow up with  Dr. Armanda Magic.      Lucita Ferrara, MD  Electronically Signed     RR/MEDQ  D:  05/14/2008  T:  05/14/2008  Job:  [id number]

## 2011-01-07 NOTE — H&P (Signed)
Jenna Mcdonald, Jenna Mcdonald NO.:  [id number]   MEDICAL RECORD NO.:  [id number]          PATIENT TYPE:  INP   LOCATION:  3703                         FACILITY:  [facility]   PHYSICIAN:  Lucita Ferrara, MD         DATE OF BIRTH:  1946-06-15   DATE OF ADMISSION:  05/13/2008  DATE OF DISCHARGE:                              HISTORY & PHYSICAL   She denies orthopnea, paroxysmal nocturnal dyspnea.  Otherwise,  12-  point review of systems negative.  The patient also felt quite anxious  throughout her experience with her chest pain.  She, however, states  that it was not anxiety that caused the chest pain.   PAST MEDICAL HISTORY:  Osteoarthritis status post knee surgery.   PAST SURGICAL HISTORY:  1. Periodontal cyst removal in her 20s.  2. Status post oophorectomy.   SOCIAL HISTORY:  She denies drugs and alcohol.  She stopped smoking  about 4 months ago.   FAMILY HISTORY:  Father died at age 20.  The mother died at age 94.   MEDICATIONS:  1. Calcium.  2. Multivitamin.  3. Glucosamine.  4. Ibuprofen.  5. Cholesterol medication of not known type.   PHYSICAL EXAMINATION:  GENERAL:  The patient is in acute distress.  VITAL SIGNS:  Temperature is 98.1, pulse 79, respirations 20, blood  pressure is 105/54, pulse oximetry 96% on room air.  HEENT:  Normocephalic, atraumatic.  Sclerae is anicteric.  NECK:  Supple.  No JVD or bruits.  PERLA.  Extraocular muscles intact.  LUNGS:  Clear to auscultation bilaterally.  No rhonchi, rales or wheeze.  EXTREMITIES:  No clubbing, cyanosis or edema.   LABORATORY DATA:  Troponins x3 essentially negative.  Beta natruretic  peptide 64.  Complete metabolic panel within normal limits.  CBC is also  normal with the exception of mildly low hemoglobin of 11.8.  Chest x-ray  shows mild pulmonary vascular congestion without frank edema.  No focal  airspace disease.   ASSESSMENT/PLAN:  1. Atypical chest pain.  2. Hypercholesteremia.  3. Relatively  low risk factor with negative family history.  4. No history of hypertension or coronary artery disease.  She has      already quit smoking.   DISCUSSION/PLAN:  Will go ahead and admit the patient to medical  telemetry unit, cycle her enzymes x3 q.8 h.  Will start risk factor  modification and get a fasting lipid profile, TSH.  Again, if her  cardiac enzymes are negative, we may decide to pursue a stress test in  the morning prior to her departure here.  Will put her on oxygen, DVT  and GI prophylaxis with Lovenox and Protonix.  Rest of plans are  dependent on her progress.      Lucita Ferrara, MD  Electronically Signed     RR/MEDQ  D:  05/14/2008  T:  05/14/2008  Job:  [id number]

## 2011-01-10 NOTE — Op Note (Signed)
NAME:  Jenna Mcdonald, Jenna Mcdonald                      ACCOUNT NO.:  [id number]   MEDICAL RECORD NO.:  [id number]                   PATIENT TYPE:  AMB   LOCATION:  ENDO                                 FACILITY:  [facility]   PHYSICIAN:  Danise Edge, M.D.                DATE OF BIRTH:  01-18-1946   DATE OF PROCEDURE:  05/03/2003  DATE OF DISCHARGE:                                 OPERATIVE REPORT   PROCEDURE:  Diagnostic esophagogastroduodenoscopy and colonoscopy.   INDICATIONS FOR PROCEDURE:  Jenna Mcdonald is a 65 year old female  born 05-Oct-1945. Jenna Mcdonald had unexplained generalized constant  lower abdominal discomfort. Pelvic ultrasound revealed either a right  adnexal mass versus pelvic lymph node. CT scan of the abdomen and pelvis was  normal. Jenna Mcdonald has a history of endometriosis. She is post menopausal.   ENDOSCOPIST:  Danise Edge, M.D.   PREMEDICATION:  Versed 10 mg, Demerol 75 mg.   PROCEDURE:  Diagnostic esophagogastroduodenoscopy.   DESCRIPTION OF PROCEDURE:  After obtaining informed consent, Jenna Mcdonald was  placed in the left lateral decubitus position. I administered intravenous  Demerol and intravenous Versed to achieve conscious sedation for the  procedure. The patient's blood pressure, oxygen saturation and cardiac  rhythm were monitored throughout the procedure and documented in the medical  record.   The Olympus gastroscope was passed through the posterior hypopharynx into  the proximal esophagus without difficulty. The hypopharynx, larynx and vocal  cords appeared normal.   ESOPHAGOSCOPY:  The proximal, mid and lower segments of the esophageal  mucosa appeared completely normal.   GASTROSCOPY:  Retroflexed view of the gastric cardia and fundus was normal.  The gastric body, antrum and pylorus  appeared normal.   DUODENOSCOPY:  The duodenal bulb, mid duodenum and distal duodenum appear  normal.   ASSESSMENT:  Normal  esophagogastroduodenoscopy.   PROCEDURE:  Proctocolonoscopy to the cecum.   DESCRIPTION OF PROCEDURE:  Anal inspection was normal. Digital rectal exam  was normal. The Olympus adjustable pediatric colonoscope was introduced into  the rectum and advanced to the cecum. The normal appearing ileocecal valve  was intubated and the distal ileum inspected. Colonic preparation for the  exam today was excellent.   RECTUM:  Normal.   SIGMOID COLON AND DESCENDING COLON:  From the distal sigmoid colon at 25 cm  from the anal verge, a 1 mm sessile polyp was removed with the cold biopsy  forceps.   SPLENIC FLEXURE:  Normal.   TRANSVERSE COLON:  Normal.   HEPATIC FLEXURE:  Normal.   ASCENDING COLON:  Normal.   CECUM AND ILEOCECAL VALVE:  Normal.   DISTAL ILEUM:  Normal.   ASSESSMENT:  Screening proctocolonoscopy to the cecum reveals a 1 mm sessile  polyp which was removed from the distal sigmoid;  otherwise, normal  proctocolonoscopy with distal ileoscopy.  Danise Edge, M.D.    MJ/MEDQ  D:  05/03/2003  T:  05/03/2003  Job:  [id number]   cc:   Lenoard Aden, M.D.  [address]  [place]  [place] [address]  Fax: [phone]   Candyce Churn, M.D.  [address] [place]  [place] [address]  Fax: [phone]

## 2011-01-10 NOTE — Consult Note (Signed)
[place]  Patient:    Jenna Mcdonald, Jenna Mcdonald                   MRN: [id number] Proc. Date: 11/02/00 Adm. Date:  [id number] Attending:  Pearla Dubonnet CC:         Pearla Dubonnet, M.D.   Consultation Report  BRIEF HISTORY:  Jenna Mcdonald is a 65 year old female who has been recently healthy.  She is admitted with a sudden onset of tachycardia palpitations and with the diagnosis of atrial fibrillation with a rapid ventricular response.  The patient has been relatively healthy.  She has had various aches and pains in her shoulder and in her feet but otherwise has not had any significant medical problems.  Today, around 1 oclock she had the sudden onset of tachy palpitations.  She did not have any chest pain or pressure but did develop some mild shortness of breath and some noticeable dyspnea with any sort of exertion.  She originally thought that the problem would go away and so she waited several hours before seeking medical attention.  The problem persisted and she presented to Dr. Robley Fries office for further evaluation.  She denies any real chest pain or pressure.  She denies any syncope or presyncopal episodes.  CURRENT MEDICATIONS:  Naprosyn as needed.  ALLERGIES:  No known drug allergies.  PAST MEDICAL HISTORY: 1. History of joint pain.  SOCIAL HISTORY:  The patient is a nonsmoker.  She drinks alcohol only rarely.  FAMILY HISTORY:  Noncontributory.  REVIEW OF SYSTEMS:  She denies any weight gain or weight loss.  She denies any heat or cold intolerance.  She has not had any sweats.  She denies any change of her bowel habits.  She denies any problems with her eyes, ears, nose and throat.  She denies any cough or cold symptoms.  She denies any previous heart problems.  She denies any indigestion, nausea or change in her bowel habits. She denies any hematuria or dysuria or problems with her urinary tract.  PHYSICAL  EXAMINATION:  GENERAL:  She is a middle aged female in no acute distress.  VITAL SIGNS:  Heart rate is 150.  Blood pressure is 120/70.  HEENT:  She has 2+ carotids with no bruits.  There is no JVD, there is no thyromegaly.  LUNGS:  Clear to auscultation.  HEART:  Irregularly irregular.  ABDOMEN:  Obese but otherwise benign.  She has no hepatosplenomegaly and no bruits.  Her abdomen is nontender.  EXTREMITIES:  She has 2+ pulses.  There is no calf tenderness.  She has no clubbing, cyanosis, or edema.  NEUROLOGIC:  Reveals cranial nerves II-XII intact and motor and sensory function are intact.  Gait was not assessed.  EKG:  Reveals atrial fibrillation with a rapid ventricular response.  She has nonspecific ST and T wave abnormalities.  Her cardiac enzymes are negative.  IMPRESSION:  Jenna Mcdonald presents with new onset atrial fibrillation with a rapid ventricular response.  Her laboratory data is unremarkable.  Her cardiac enzymes are negative.  I agree with the Cardizem drip.  We will add low dose IV Lopressor since she is still tachycardic on 15 mg/hr of IV Cardizem.  We will add digoxin.  If she converts rather quickly to normal sinus rhythm then we will be able to discharge her on aspirin once a day.  If she remains in atrial fibrillation for more than 48 hours then she will need to  start on Coumadin.  I agree with an echocardiogram in the morning.  TSH levels have been drawn and the results are pending. DD:  11/02/00 TD:  11/03/00 Job: [id number]

## 2011-01-10 NOTE — Discharge Summary (Signed)
NAMESHUNDA, RABADI NO.:  [id number]   MEDICAL RECORD NO.:  [id number]         PATIENT TYPE:  CINP   LOCATION:                               FACILITY:  [facility]   PHYSICIAN:  Candyce Churn, M.D.DATE OF BIRTH:  1946/05/15   DATE OF ADMISSION:  05/14/2008  DATE OF DISCHARGE:  05/15/2008                               DISCHARGE SUMMARY   DISCHARGE DIAGNOSES:  1. Chest pain syndrome with palpitations, resolved.  No myocardial      injury noted.  Further workup as outpatient to occur.  2. History of osteoarthritis of the knees and status post knee      surgery.  3. Obesity.  4. Hyperlipidemia.   DISCHARGE MEDICATIONS:  1. Prilosec, over the counter 20 mg daily.  2. Metoprolol 12.5 mg p.o. b.i.d.  3. Calcium carbonate with vitamin D supplement daily.  4. Multivitamin daily.  5. Glucosamine supplement daily.  6. Ibuprofen 400 mg b.i.d.  7. Gemfibrozil 300 mg p.o. b.i.d.   HOSPITAL COURSE:  Ms. Jenna Mcdonald is a very pleasant 65 year old  female who presented to the [facility] Emergency Room, complained of  chest pain that started that day as well as palpitations.  It was an  acute onset of substernal pain that was intermittent and did not  radiate.  It was characterized at about 7/10 chest pain, and it is not  aggravated by exertion.  She had been complaining of some palpitations  as well.   Chest x-ray in the emergency room revealed some mild pulmonary vascular  congestion without a frank edema, and troponins were negative x3.  BNP  was 64.  She was admitted to be observed overnight to make sure that his  cardiac enzymes did not turn positive and they indeed did not.  CK x3  were 67, 79, and 82 respectively, and troponin I was 0.02 x3, and CK-MB  was 1.6, 1.7, and 1.8  respectively.   The patient had no further palpitations, placed on low-dose beta blocker  in the hospital, and she was discharged home in improved condition, on  PPI therapy and  beta-blocker therapy to receive outpatient stress  Cardiolite.   Because of her recent knee surgery, she would not be a performer of  standard treadmill test.   Other laboratories while admitted included a TSH of 2.049 and lipid  profile revealing a cholesterol of 146, triglyceride of 105, HDL  27,  and LDL 98 with a total cholesterol and HDL ratio of 5.4.  CBC in the  emergency  room revealed a white count of 9300, hemoglobin 11.8, platelet count  246,000, and the sodium was 143, potassium 3.5, chloride 109, bicarb 26,  glucose 115, BUN 21, creatinine 0.77.  LFTs were within normal limits.  EKG revealed sinus tachycardia with nonspecific ST-T wave changes.  No  acute injury noted.      Candyce Churn, M.D.  Electronically Signed     RNG/MEDQ  D:  05/17/2008  T:  05/18/2008  Job:  [id number]   cc:   [facility].

## 2011-05-02 ENCOUNTER — Ambulatory Visit
Admission: RE | Admit: 2011-05-02 | Discharge: 2011-05-02 | Disposition: A | Discharge status: Home / Self Care | Payer: BC Managed Care – PPO | Source: Ambulatory Visit | Attending: Oncology | Admitting: Oncology

## 2011-05-02 DIAGNOSIS — N63 Unspecified lump in unspecified breast: Secondary | ICD-10-CM

## 2011-05-16 ENCOUNTER — Encounter (HOSPITAL_BASED_OUTPATIENT_CLINIC_OR_DEPARTMENT_OTHER): Payer: BC Managed Care – PPO | Admitting: Oncology

## 2011-05-16 DIAGNOSIS — I4891 Unspecified atrial fibrillation: Secondary | ICD-10-CM

## 2011-05-16 DIAGNOSIS — C50419 Malignant neoplasm of upper-outer quadrant of unspecified female breast: Secondary | ICD-10-CM

## 2011-05-16 DIAGNOSIS — D568 Other thalassemias: Secondary | ICD-10-CM

## 2011-05-26 LAB — DIFFERENTIAL
Basophils Absolute: 0.1
Basophils Relative: 1
Eosinophils Absolute: 0.2
Eosinophils Relative: 2
Lymphocytes Relative: 30
Lymphs Abs: 2.7
Monocytes Absolute: 0.6
Monocytes Relative: 7
Neutro Abs: 5.6
Neutrophils Relative %: 61

## 2011-05-26 LAB — CARDIAC PANEL(CRET KIN+CKTOT+MB+TROPI)
CK, MB: 1.6
CK, MB: 1.7
CK, MB: 1.8
Relative Index: INVALID
Relative Index: INVALID
Relative Index: INVALID
Total CK: 67
Total CK: 79
Total CK: 82
Troponin I: 0.02
Troponin I: 0.02
Troponin I: 0.02

## 2011-05-26 LAB — CBC
HCT: 37.7
Hemoglobin: 11.8 — ABNORMAL LOW
MCHC: 31.4
MCV: 62.6 — ABNORMAL LOW
Platelets: 246
RBC: 6.01 — ABNORMAL HIGH
RDW: 16.2 — ABNORMAL HIGH
WBC: 9.3

## 2011-05-26 LAB — COMPREHENSIVE METABOLIC PANEL
ALT: 18
AST: 17
Albumin: 4
Alkaline Phosphatase: 101
BUN: 21
CO2: 26
Calcium: 9.9
Chloride: 109
Creatinine, Ser: 0.77
GFR calc Af Amer: >60
GFR calc non Af Amer: >60
Glucose, Bld: 115 — ABNORMAL HIGH
Potassium: 3.5
Sodium: 143
Total Bilirubin: 0.9
Total Protein: 7

## 2011-05-26 LAB — CK TOTAL AND CKMB (NOT AT ARMC)
CK, MB: 1.7
Relative Index: INVALID
Total CK: 63

## 2011-05-26 LAB — POCT CARDIAC MARKERS
CKMB, poc: 1.2
CKMB, poc: 1.3
Myoglobin, poc: 53.9
Myoglobin, poc: 66.2
Troponin i, poc: <0.05
Troponin i, poc: <0.05

## 2011-05-26 LAB — LIPID PANEL
Cholesterol: 146
HDL: 27 — ABNORMAL LOW
LDL Cholesterol: 98
Total CHOL/HDL Ratio: 5.4
Triglycerides: 105
VLDL: 21

## 2011-05-26 LAB — TSH: TSH: 2.049

## 2011-05-26 LAB — TROPONIN I: Troponin I: 0.01

## 2011-05-26 LAB — B-NATRIURETIC PEPTIDE (CONVERTED LAB): Pro B Natriuretic peptide (BNP): 64

## 2011-06-02 ENCOUNTER — Other Ambulatory Visit: Payer: Self-pay | Admitting: Oncology

## 2011-06-02 DIAGNOSIS — Z9889 Other specified postprocedural states: Secondary | ICD-10-CM

## 2011-06-02 DIAGNOSIS — Z853 Personal history of malignant neoplasm of breast: Secondary | ICD-10-CM

## 2011-06-12 ENCOUNTER — Ambulatory Visit
Admission: RE | Admit: 2011-06-12 | Discharge: 2011-06-12 | Disposition: A | Discharge status: Home / Self Care | Payer: BC Managed Care – PPO | Source: Ambulatory Visit | Attending: Oncology | Admitting: Oncology

## 2011-06-12 DIAGNOSIS — Z853 Personal history of malignant neoplasm of breast: Secondary | ICD-10-CM

## 2011-06-12 DIAGNOSIS — Z9889 Other specified postprocedural states: Secondary | ICD-10-CM

## 2011-06-12 MED ORDER — GADOBENATE DIMEGLUMINE 529 MG/ML IV SOLN
20.0000 mL | Freq: Once | INTRAVENOUS | Status: AC | PRN
  Administered 2011-06-12: 20 mL via INTRAVENOUS

## 2011-07-07 ENCOUNTER — Telehealth: Payer: Self-pay

## 2011-07-07 NOTE — Telephone Encounter (Signed)
Received call from pt questioning if she needs to be seen by Dr Cyndie Chime since her MRI was after her MD appt.  Per Dr Patsy Lager dictation, pt only needs yearly f/u pending normal MRI.  Message left on pt's work VM with this information and to contact our office for questions. dph

## 2011-09-11 ENCOUNTER — Inpatient Hospital Stay (HOSPITAL_COMMUNITY): Payer: BC Managed Care – PPO

## 2011-09-11 ENCOUNTER — Inpatient Hospital Stay (HOSPITAL_COMMUNITY)
Admission: EM | Admit: 2011-09-11 | Discharge: 2011-09-14 | DRG: 139 | Disposition: A | Discharge status: Home / Self Care | Payer: BC Managed Care – PPO | Attending: Interventional Cardiology | Admitting: Interventional Cardiology

## 2011-09-11 ENCOUNTER — Other Ambulatory Visit: Payer: Self-pay | Admitting: Interventional Cardiology

## 2011-09-11 ENCOUNTER — Other Ambulatory Visit: Payer: Self-pay

## 2011-09-11 ENCOUNTER — Emergency Department (HOSPITAL_COMMUNITY): Payer: BC Managed Care – PPO

## 2011-09-11 ENCOUNTER — Encounter (HOSPITAL_COMMUNITY): Payer: Self-pay

## 2011-09-11 DIAGNOSIS — E119 Type 2 diabetes mellitus without complications: Secondary | ICD-10-CM | POA: Diagnosis present

## 2011-09-11 DIAGNOSIS — D563 Thalassemia minor: Secondary | ICD-10-CM | POA: Insufficient documentation

## 2011-09-11 DIAGNOSIS — M1990 Unspecified osteoarthritis, unspecified site: Secondary | ICD-10-CM | POA: Insufficient documentation

## 2011-09-11 DIAGNOSIS — I480 Paroxysmal atrial fibrillation: Secondary | ICD-10-CM

## 2011-09-11 DIAGNOSIS — Z888 Allergy status to other drugs, medicaments and biological substances status: Secondary | ICD-10-CM

## 2011-09-11 DIAGNOSIS — Z6837 Body mass index (BMI) 37.0-37.9, adult: Secondary | ICD-10-CM

## 2011-09-11 DIAGNOSIS — R040 Epistaxis: Secondary | ICD-10-CM | POA: Diagnosis present

## 2011-09-11 DIAGNOSIS — R55 Syncope and collapse: Secondary | ICD-10-CM

## 2011-09-11 DIAGNOSIS — I4891 Unspecified atrial fibrillation: Principal | ICD-10-CM | POA: Diagnosis present

## 2011-09-11 DIAGNOSIS — K219 Gastro-esophageal reflux disease without esophagitis: Secondary | ICD-10-CM

## 2011-09-11 DIAGNOSIS — E6609 Other obesity due to excess calories: Secondary | ICD-10-CM

## 2011-09-11 DIAGNOSIS — E236 Other disorders of pituitary gland: Secondary | ICD-10-CM | POA: Insufficient documentation

## 2011-09-11 DIAGNOSIS — I4819 Other persistent atrial fibrillation: Secondary | ICD-10-CM

## 2011-09-11 DIAGNOSIS — I119 Hypertensive heart disease without heart failure: Secondary | ICD-10-CM | POA: Diagnosis present

## 2011-09-11 DIAGNOSIS — I10 Essential (primary) hypertension: Secondary | ICD-10-CM

## 2011-09-11 DIAGNOSIS — Z853 Personal history of malignant neoplasm of breast: Secondary | ICD-10-CM

## 2011-09-11 DIAGNOSIS — Z7901 Long term (current) use of anticoagulants: Secondary | ICD-10-CM

## 2011-09-11 DIAGNOSIS — M519 Unspecified thoracic, thoracolumbar and lumbosacral intervertebral disc disorder: Secondary | ICD-10-CM | POA: Insufficient documentation

## 2011-09-11 DIAGNOSIS — IMO0002 Reserved for concepts with insufficient information to code with codable children: Secondary | ICD-10-CM | POA: Diagnosis present

## 2011-09-11 DIAGNOSIS — E669 Obesity, unspecified: Secondary | ICD-10-CM | POA: Diagnosis present

## 2011-09-11 DIAGNOSIS — K589 Irritable bowel syndrome without diarrhea: Secondary | ICD-10-CM | POA: Insufficient documentation

## 2011-09-11 DIAGNOSIS — J019 Acute sinusitis, unspecified: Secondary | ICD-10-CM | POA: Diagnosis present

## 2011-09-11 DIAGNOSIS — C50919 Malignant neoplasm of unspecified site of unspecified female breast: Secondary | ICD-10-CM | POA: Insufficient documentation

## 2011-09-11 HISTORY — DX: Unspecified osteoarthritis, unspecified site: M19.90

## 2011-09-11 HISTORY — DX: Malignant neoplasm of unspecified site of unspecified female breast: C50.919

## 2011-09-11 HISTORY — DX: Gastro-esophageal reflux disease without esophagitis: K21.9

## 2011-09-11 HISTORY — DX: Other intervertebral disc degeneration, lumbar region without mention of lumbar back pain or lower extremity pain: M51.369

## 2011-09-11 HISTORY — DX: Nausea with vomiting, unspecified: R11.2

## 2011-09-11 HISTORY — DX: Pneumonia, unspecified organism: J18.9

## 2011-09-11 HISTORY — DX: Essential (primary) hypertension: I10

## 2011-09-11 HISTORY — DX: Other intervertebral disc degeneration, lumbar region: M51.36

## 2011-09-11 HISTORY — DX: Other specified postprocedural states: Z98.890

## 2011-09-11 HISTORY — DX: Irritable bowel syndrome, unspecified: K58.9

## 2011-09-11 HISTORY — DX: Anemia, unspecified: D64.9

## 2011-09-11 LAB — DIFFERENTIAL
Basophils Absolute: 0 10*3/uL (ref 0.0–0.1)
Basophils Absolute: 0.1 10*3/uL (ref 0.0–0.1)
Basophils Relative: 0 % (ref 0–1)
Basophils Relative: 1 % (ref 0–1)
Eosinophils Absolute: 0.1 10*3/uL (ref 0.0–0.7)
Eosinophils Absolute: 0.1 10*3/uL (ref 0.0–0.7)
Eosinophils Relative: 1 % (ref 0–5)
Eosinophils Relative: 1 % (ref 0–5)
Lymphocytes Relative: 14 % (ref 12–46)
Lymphocytes Relative: 31 % (ref 12–46)
Lymphs Abs: 1.4 10*3/uL (ref 0.7–4.0)
Lymphs Abs: 2.7 10*3/uL (ref 0.7–4.0)
Monocytes Absolute: 0.4 10*3/uL (ref 0.1–1.0)
Monocytes Absolute: 0.4 10*3/uL (ref 0.1–1.0)
Monocytes Relative: 4 % (ref 3–12)
Monocytes Relative: 5 % (ref 3–12)
Neutro Abs: 7.8 10*3/uL — ABNORMAL HIGH (ref 1.7–7.7)
Neutro Abs: 5.4 10*3/uL (ref 1.7–7.7)
Neutrophils Relative %: 81 % — ABNORMAL HIGH (ref 43–77)
Neutrophils Relative %: 62 % (ref 43–77)

## 2011-09-11 LAB — CBC
HCT: 35.5 % — ABNORMAL LOW (ref 36.0–46.0)
HCT: 35.7 % — ABNORMAL LOW (ref 36.0–46.0)
Hemoglobin: 11.1 g/dL — ABNORMAL LOW (ref 12.0–15.0)
Hemoglobin: 11.2 g/dL — ABNORMAL LOW (ref 12.0–15.0)
MCH: 19.1 pg — ABNORMAL LOW (ref 26.0–34.0)
MCH: 19 pg — ABNORMAL LOW (ref 26.0–34.0)
MCHC: 31.3 g/dL (ref 30.0–36.0)
MCHC: 31.4 g/dL (ref 30.0–36.0)
MCV: 61.2 fL — ABNORMAL LOW (ref 78.0–100.0)
MCV: 60.6 fL — ABNORMAL LOW (ref 78.0–100.0)
Platelets: 210 10*3/uL (ref 150–400)
Platelets: 236 10*3/uL (ref 150–400)
RBC: 5.8 MIL/uL — ABNORMAL HIGH (ref 3.87–5.11)
RBC: 5.89 MIL/uL — ABNORMAL HIGH (ref 3.87–5.11)
RDW: 16.4 % — ABNORMAL HIGH (ref 11.5–15.5)
RDW: 16.3 % — ABNORMAL HIGH (ref 11.5–15.5)
WBC: 9.7 10*3/uL (ref 4.0–10.5)
WBC: 8.7 10*3/uL (ref 4.0–10.5)

## 2011-09-11 LAB — COMPREHENSIVE METABOLIC PANEL
ALT: 21 U/L (ref 0–35)
ALT: 20 U/L (ref 0–35)
AST: 18 U/L (ref 0–37)
AST: 14 U/L (ref 0–37)
Albumin: 3.5 g/dL (ref 3.5–5.2)
Albumin: 3.8 g/dL (ref 3.5–5.2)
Alkaline Phosphatase: 85 U/L (ref 39–117)
Alkaline Phosphatase: 81 U/L (ref 39–117)
BUN: 22 mg/dL (ref 6–23)
BUN: 23 mg/dL (ref 6–23)
CO2: 23 mEq/L (ref 19–32)
CO2: 24 mEq/L (ref 19–32)
Calcium: 10.2 mg/dL (ref 8.4–10.5)
Calcium: 9.7 mg/dL (ref 8.4–10.5)
Chloride: 106 mEq/L (ref 96–112)
Chloride: 104 mEq/L (ref 96–112)
Creatinine, Ser: 0.95 mg/dL (ref 0.50–1.10)
Creatinine, Ser: 1 mg/dL (ref 0.50–1.10)
GFR calc Af Amer: 71 mL/min — ABNORMAL LOW (ref >90)
GFR calc Af Amer: 67 mL/min — ABNORMAL LOW (ref >90)
GFR calc non Af Amer: 62 mL/min — ABNORMAL LOW (ref >90)
GFR calc non Af Amer: 58 mL/min — ABNORMAL LOW (ref >90)
Glucose, Bld: 228 mg/dL — ABNORMAL HIGH (ref 70–99)
Glucose, Bld: 126 mg/dL — ABNORMAL HIGH (ref 70–99)
Potassium: 5.1 mEq/L (ref 3.5–5.1)
Potassium: 4.2 mEq/L (ref 3.5–5.1)
Sodium: 140 mEq/L (ref 135–145)
Sodium: 140 mEq/L (ref 135–145)
Total Bilirubin: 0.3 mg/dL (ref 0.3–1.2)
Total Bilirubin: 0.4 mg/dL (ref 0.3–1.2)
Total Protein: 6.9 g/dL (ref 6.0–8.3)
Total Protein: 7 g/dL (ref 6.0–8.3)

## 2011-09-11 LAB — PROTIME-INR
INR: 2.09 — ABNORMAL HIGH (ref 0.00–1.49)
INR: 2 — ABNORMAL HIGH (ref 0.00–1.49)
Prothrombin Time: 23.8 seconds — ABNORMAL HIGH (ref 11.6–15.2)
Prothrombin Time: 23 seconds — ABNORMAL HIGH (ref 11.6–15.2)

## 2011-09-11 LAB — GLUCOSE, CAPILLARY
Glucose-Capillary: 213 mg/dL — ABNORMAL HIGH (ref 70–99)
Glucose-Capillary: 129 mg/dL — ABNORMAL HIGH (ref 70–99)

## 2011-09-11 LAB — PRO B NATRIURETIC PEPTIDE: Pro B Natriuretic peptide (BNP): 702.3 pg/mL — ABNORMAL HIGH (ref 0–125)

## 2011-09-11 LAB — CARDIAC PANEL(CRET KIN+CKTOT+MB+TROPI)
CK, MB: 2.3 ng/mL (ref 0.3–4.0)
Relative Index: INVALID (ref 0.0–2.5)
Total CK: 68 U/L (ref 7–177)
Troponin I: <0.3 ng/mL (ref <0.30)

## 2011-09-11 LAB — MAGNESIUM: Magnesium: 1.9 mg/dL (ref 1.5–2.5)

## 2011-09-11 LAB — TROPONIN I: Troponin I: <0.3 ng/mL (ref <0.30)

## 2011-09-11 LAB — APTT: aPTT: 36 seconds (ref 24–37)

## 2011-09-11 MED ORDER — ACETAMINOPHEN 650 MG RE SUPP: 650.0000 mg | Freq: Four times a day (QID) | RECTAL | Status: DC | PRN

## 2011-09-11 MED ORDER — ONDANSETRON HCL 4 MG/2ML IJ SOLN: 4.0000 mg | Freq: Four times a day (QID) | INTRAMUSCULAR | Status: DC | PRN

## 2011-09-11 MED ORDER — DILTIAZEM LOAD VIA INFUSION
10.0000 mg | Freq: Once | INTRAVENOUS | Status: AC
  Administered 2011-09-11: 10 mg via INTRAVENOUS
  Filled 2011-09-11: qty 10

## 2011-09-11 MED ORDER — ONDANSETRON HCL 4 MG PO TABS: 4.0000 mg | ORAL_TABLET | Freq: Four times a day (QID) | ORAL | Status: DC | PRN

## 2011-09-11 MED ORDER — ACETAMINOPHEN 325 MG PO TABS
650.0000 mg | ORAL_TABLET | Freq: Four times a day (QID) | ORAL | Status: DC | PRN
  Administered 2011-09-13: 650 mg via ORAL
  Filled 2011-09-11: qty 2

## 2011-09-11 MED ORDER — WARFARIN SODIUM 5 MG PO TABS
5.0000 mg | ORAL_TABLET | Freq: Once | ORAL | Status: AC
  Administered 2011-09-11: 5 mg via ORAL
  Filled 2011-09-11: qty 1

## 2011-09-11 MED ORDER — WARFARIN VIDEO
Freq: Once | Status: AC
  Administered 2011-09-12: 08:00:00

## 2011-09-11 MED ORDER — HYDROCODONE-ACETAMINOPHEN 5-325 MG PO TABS: 1.0000 | ORAL_TABLET | ORAL | Status: DC | PRN

## 2011-09-11 MED ORDER — SODIUM CHLORIDE 0.9 % IJ SOLN
3.0000 mL | Freq: Two times a day (BID) | INTRAMUSCULAR | Status: DC
  Administered 2011-09-12 – 2011-09-14 (×5): 3 mL via INTRAVENOUS

## 2011-09-11 MED ORDER — FLECAINIDE ACETATE 50 MG PO TABS
50.0000 mg | ORAL_TABLET | Freq: Two times a day (BID) | ORAL | Status: DC
  Administered 2011-09-11 – 2011-09-14 (×6): 50 mg via ORAL
  Filled 2011-09-11 (×7): qty 1

## 2011-09-11 MED ORDER — PATIENT'S GUIDE TO USING COUMADIN BOOK
Freq: Once | Status: AC
  Administered 2011-09-11: 19:00:00
  Filled 2011-09-11: qty 1

## 2011-09-11 MED ORDER — METFORMIN HCL 500 MG PO TABS
500.0000 mg | ORAL_TABLET | Freq: Two times a day (BID) | ORAL | Status: DC
  Administered 2011-09-11 – 2011-09-14 (×6): 500 mg via ORAL
  Filled 2011-09-11 (×8): qty 1

## 2011-09-11 MED ORDER — OFF THE BEAT BOOK
Freq: Once | Status: AC
  Administered 2011-09-11
  Filled 2011-09-11 (×2): qty 1

## 2011-09-11 MED ORDER — DILTIAZEM HCL 100 MG IV SOLR
5.0000 mg/h | INTRAVENOUS | Status: DC
  Administered 2011-09-11: 5 mg/h via INTRAVENOUS
  Administered 2011-09-11: 10 mg/h via INTRAVENOUS
  Filled 2011-09-11 (×2): qty 100

## 2011-09-11 MED ORDER — ZOLPIDEM TARTRATE 5 MG PO TABS: 5.0000 mg | ORAL_TABLET | Freq: Every evening | ORAL | Status: DC | PRN

## 2011-09-11 NOTE — ED Notes (Signed)
Cardiology at bedside.

## 2011-09-11 NOTE — H&P (Signed)
Jenna Mcdonald is a 66 y.o. female  Admit date: 09/11/2011 Referring Physician: Johnella Moloney, M.D. Primary Cardiologist:: Kym Groom M.D. Chief complaint / reason for admission: Near syncope and palpitations  HPI: The patient is 66 and has a history of paroxysmal atrial fibrillation. She has had extensive evaluation and management by Dr. Mayford Knife. The patient has had multiple medication intolerances including diltiazem, metoprolol, and others do to side effects. No significant allergic responses. She is mostly at fatigue and on occasion hair loss.  Over the past 3-6 months the patient is at least one episode of palpitation, weakness, nausea, near syncope, but she believes are related to recurrent episodes of atrial for ablation. This morning after arriving at work she measured her blood pressure and heart rate and they were normal. The heart rate was 68 beats per minute. At around 8 AM she suddenly became warm, felt nervous, had nausea, and near syncope, and noted fluttering in her chest. She called 911 and found that she was in atrial fibrillation with rapid ventricular response. She was brought to the emergency room. At the time of evaluation she is still in atrial fibrillation, supine, and feels relatively stable. She is having no chest discomfort and denies dyspnea.    PMH:    Past Medical History  Diagnosis Date  . Diabetes mellitus   . A-fib   . Breast ca   . Obesity   . Gastroesophageal reflux disease   . Hypertension   . Irritable bowel   . Osteoarthritis   . Degenerative disc disease, lumbar     PSH:    Past Surgical History  Procedure Date  . Mastectomy   . Replacement total knee   . Ovary surgery     ALLERGIES:   Diltiazem; Femara; Metoprolol; Tamoxifen; Tetracyclines & related; and Vicodin  Prior to Admit Meds:   Prescriptions prior to admission  Medication Sig Dispense Refill  . Calcium Carbonate-Vitamin D (CALCIUM 600 + D PO) Take 1 tablet by mouth 2 (two)  times daily.      . cholecalciferol (VITAMIN D) 1000 UNITS tablet Take 1,000 Units by mouth daily.      . metFORMIN (GLUCOPHAGE) 500 MG tablet Take 500 mg by mouth 2 (two) times daily with a meal.      . Multiple Vitamin (MULITIVITAMIN WITH MINERALS) TABS Take 1 tablet by mouth daily.      . verapamil (CALAN) 120 MG tablet Take 120 mg by mouth 2 (two) times daily.      Marland Kitchen warfarin (COUMADIN) 5 MG tablet Take 5-7.5 mg by mouth every evening. Mon, Wed, & Fri, take 7.5mg . 5mg  all other days       Family HX:   History reviewed. No pertinent family history. Social HX:    History   Social History  . Marital Status: Single    Spouse Name: N/A    Number of Children: N/A  . Years of Education: N/A   Occupational History  . Not on file.   Social History Main Topics  . Smoking status: Former Smoker -- 10 years    Quit date: 11/12/2010  . Smokeless tobacco: Not on file  . Alcohol Use: No  . Drug Use: No  . Sexually Active: Not on file   Other Topics Concern  . Not on file   Social History Narrative  . No narrative on file     ROS: The patient has multiple musculoskeletal complaints. She has been bothered in the past by  irritable bowel complaints and reflux. She has no pulmonary problems. There no neurological symptoms. She has never had true syncope. There is no peripheral edema. Physical Exam: Blood pressure 121/80, pulse 128, temperature 98.2 F (36.8 C), temperature source Oral, resp. rate 22, height 5\' 4"  (1.626 m), weight 99.338 kg (219 lb), SpO2 98.00%.    On examination the patient is lying comfortably in the gurney in the emergency room. She is in no acute distress. Her skin color is good.  HEENT exam demonstrates pupils are equal and reactive to light.  Neck exam reveals no JVD or carotid bruits.  Chest is clear to auscultation and percussion.  Cardiac exam reveals an irregularly irregular rhythm with a 1-2 of 6 systolic murmur along the left mid sternal border.  Abdomen  soft. Bowel sounds are normal. No organomegaly is noted.  Extremities reveal no edema. Radial and lower extremity dorsalis pedis pulses are 2+.  The neurological exam is intact. The patient is anxious. Labs:   Lab Results  Component Value Date   WBC 9.7 09/11/2011   HGB 11.1* 09/11/2011   HCT 35.5* 09/11/2011   MCV 61.2* 09/11/2011   PLT 210 09/11/2011     Lab 09/11/11 1010  NA 140  K 5.1  CL 106  CO2 23  BUN 22  CREATININE 0.95  CALCIUM 10.2  PROT 6.9  BILITOT 0.3  ALKPHOS 85  ALT 21  AST 18  GLUCOSE 228*   Lab Results  Component Value Date   CKTOTAL 82 05/14/2008   CKMB 1.8 05/14/2008   TROPONINI <0.30 09/11/2011   ECHOCARDIOGRAM: LVEF 70%. Mild concentric hypertrophy, August 2012    Radiology:  Pending  EKG:  Atrial fibrillation with rapid ventricular response  ASSESSMENT:   1. Paroxysmal atrial fibrillation with significant symptoms including pre-syncope.  2. Multiple medication intolerances.  3. History of hypertension   Plan:  1. I spoke with Dr. Mayford Knife concerning the patient and we attempt rhythm control by adding flecainide after the patient reversed to normal sinus rhythm are after her rate is slowed with IV diltiazem.  2. Continue Coumadin anticoagulation  3. The patient will be admitted to telemetry to monitor as antiarrhythmic therapy is started.  4. We'll get PA and lateral chest x-ray. Lesleigh Noe 09/11/2011 4:22 PM

## 2011-09-11 NOTE — ED Provider Notes (Signed)
History     CSN: [id number]  Arrival date & time 09/11/11  4098   First MD Initiated Contact with Patient 09/11/11 [phone]      Chief Complaint  Patient presents with  . Dizziness    (Consider location/radiation/quality/duration/timing/severity/associated sxs/prior treatment) HPI Comments: Patient by EMS for evaluation of dizziness that started today at work.  Began to feel very light-headed, dizzy, like she was going to pass out.  She has had similar episodes over the past several months, but no cause has been found.  Episodes last for several minutes, then improve.  She has a history of diabetes and afib but no new meds have been started.  Feels much better now.    The history is provided by the patient.    Past Medical History  Diagnosis Date  . Diabetes mellitus   . A-fib   . Breast ca     Past Surgical History  Procedure Date  . Mastectomy     No family history on file.  History  Substance Use Topics  . Smoking status: Not on file  . Smokeless tobacco: Not on file  . Alcohol Use:     OB History    Grav Para Term Preterm Abortions TAB SAB Ect Mult Living                  Review of Systems  All other systems reviewed and are negative.    Allergies  Review of patient's allergies indicates no known allergies.  Home Medications  No current outpatient prescriptions on file.  BP 134/64  Pulse 100  Temp(Src) 98.2 F (36.8 C) (Oral)  Resp 18  SpO2 98%  Physical Exam  Nursing note and vitals reviewed. Constitutional: She is oriented to person, place, and time. She appears well-developed and well-nourished. No distress.  HENT:  Head: Normocephalic and atraumatic.  Eyes: EOM are normal. Pupils are equal, round, and reactive to light.  Neck: Normal range of motion. Neck supple.  Cardiovascular: Normal rate and regular rhythm.  Exam reveals no gallop and no friction rub.   No murmur heard. Pulmonary/Chest: Effort normal and breath sounds normal. No  respiratory distress. She has no wheezes.  Abdominal: Soft. Bowel sounds are normal. She exhibits no distension. There is no tenderness.  Musculoskeletal: Normal range of motion.  Neurological: She is alert and oriented to person, place, and time. No cranial nerve deficit. She exhibits normal muscle tone. Coordination normal.  Skin: Skin is warm and dry. She is not diaphoretic.    ED Course  Procedures (including critical care time)   Labs Reviewed  CBC  DIFFERENTIAL  COMPREHENSIVE METABOLIC PANEL  TROPONIN I  PROTIME-INR   No results found.   No diagnosis found.   Date: 09/11/2011  Rate: 103  Rhythm: atrial fibrillation  QRS Axis: normal  Intervals: indeterminate  ST/T Wave abnormalities: normal  Conduction Disutrbances:none  Narrative Interpretation:   Old EKG Reviewed: Afib new    MDM  Patient presented after a near-syncopal episode at work.  The workup here was unremarkable with the exception of her being in atrial fibrillation.  I spoke with Dr. Katrinka Blazing from [facility].  He will see the patient and determine the appropriate disposition.        Geoffery Lyons, MD 09/11/11 [phone]

## 2011-09-11 NOTE — ED Notes (Signed)
Pt meal tray was ordered  

## 2011-09-11 NOTE — ED Notes (Signed)
Pt arrived via EMS, pt was at work, felt hot/dizzy/nausea, states that this happens once a month. Has hx of Afib.

## 2011-09-11 NOTE — ED Notes (Signed)
MD at bedside. 

## 2011-09-11 NOTE — ED Notes (Signed)
[id number] Ready 

## 2011-09-11 NOTE — Progress Notes (Signed)
ANTICOAGULATION CONSULT NOTE - Initial Consult  Pharmacy Consult for Coumadin Indication: atrial fibrillation  Allergies  Allergen Reactions  . Diltiazem   . Femara   . Metoprolol   . Tamoxifen   . Tetracyclines & Related   . Vicodin (Hydrocodone-Acetaminophen) Nausea And Vomiting    Patient reports terrible nausea; plain tylenol is ok with patient    Patient Measurements: Height: 5\' 4"  (162.6 cm) Weight: 219 lb (99.338 kg) IBW/kg (Calculated) : 54.7  Adjusted Body Weight:   Vital Signs: Temp: 98.8 F (37.1 C) (01/17 1600) Temp src: Oral (01/17 1600) BP: 132/73 mmHg (01/17 1600) Pulse Rate: 136  (01/17 1600)  Labs:  Basename 09/11/11 1011 09/11/11 1010  HGB -- 11.1*  HCT -- 35.5*  PLT -- 210  APTT -- --  LABPROT -- 23.8*  INR -- 2.09*  HEPARINUNFRC -- --  CREATININE -- 0.95  CKTOTAL -- --  CKMB -- --  TROPONINI <0.30 --   Estimated Creatinine Clearance: 67.6 ml/min (by C-G formula based on Cr of 0.95).  Medical History: Past Medical History  Diagnosis Date  . Diabetes mellitus   . A-fib   . Breast ca   . Obesity   . Gastroesophageal reflux disease   . Hypertension   . Irritable bowel   . Osteoarthritis   . Degenerative disc disease, lumbar     Medications:  Prescriptions prior to admission  Medication Sig Dispense Refill  . Calcium Carbonate-Vitamin D (CALCIUM 600 + D PO) Take 1 tablet by mouth 2 (two) times daily.      . cholecalciferol (VITAMIN D) 1000 UNITS tablet Take 1,000 Units by mouth daily.      . metFORMIN (GLUCOPHAGE) 500 MG tablet Take 500 mg by mouth 2 (two) times daily with a meal.      . Multiple Vitamin (MULITIVITAMIN WITH MINERALS) TABS Take 1 tablet by mouth daily.      . verapamil (CALAN) 120 MG tablet Take 120 mg by mouth 2 (two) times daily.      Marland Kitchen warfarin (COUMADIN) 5 MG tablet Take 5-7.5 mg by mouth every evening. Mon, Wed, & Fri, take 7.5mg . 5mg  all other days        Assessment: 66 YOF admitted with c/o nausea, headache,  dizziness. She is on coumadin for afib, she states she has not have f/u INR since Nov as was not aware needed to be monitored.  INR = 2.09, takes 5mg  TTSS and 7.5mg  MWF. CT head revealed NO bleed.     Goal of Therapy:  INR 2-3   Plan:  1. Coumadin 5mg  po tonight,daily INR  2. Spoke with patient for approx 20 min re: coumadin ed and symptoms of stroke d/t afib. 3. Coumadin book/video  Dannielle Huh 09/11/2011,5:47 PM

## 2011-09-11 NOTE — ED Notes (Signed)
Pt awaiting cardiology to see

## 2011-09-12 ENCOUNTER — Other Ambulatory Visit: Payer: Self-pay

## 2011-09-12 LAB — BASIC METABOLIC PANEL
BUN: 22 mg/dL (ref 6–23)
CO2: 23 mEq/L (ref 19–32)
Calcium: 9.5 mg/dL (ref 8.4–10.5)
Chloride: 104 mEq/L (ref 96–112)
Creatinine, Ser: 0.94 mg/dL (ref 0.50–1.10)
GFR calc Af Amer: 72 mL/min — ABNORMAL LOW (ref >90)
GFR calc non Af Amer: 62 mL/min — ABNORMAL LOW (ref >90)
Glucose, Bld: 143 mg/dL — ABNORMAL HIGH (ref 70–99)
Potassium: 4.3 mEq/L (ref 3.5–5.1)
Sodium: 140 mEq/L (ref 135–145)

## 2011-09-12 LAB — GLUCOSE, CAPILLARY
Glucose-Capillary: 149 mg/dL — ABNORMAL HIGH (ref 70–99)
Glucose-Capillary: 114 mg/dL — ABNORMAL HIGH (ref 70–99)
Glucose-Capillary: 211 mg/dL — ABNORMAL HIGH (ref 70–99)
Glucose-Capillary: 130 mg/dL — ABNORMAL HIGH (ref 70–99)

## 2011-09-12 LAB — PROTIME-INR
INR: 2.16 — ABNORMAL HIGH (ref 0.00–1.49)
Prothrombin Time: 24.5 seconds — ABNORMAL HIGH (ref 11.6–15.2)

## 2011-09-12 LAB — TSH: TSH: 1.849 u[IU]/mL (ref 0.350–4.500)

## 2011-09-12 LAB — HEMOGLOBIN A1C
Hgb A1c MFr Bld: 7 % — ABNORMAL HIGH (ref <5.7)
Mean Plasma Glucose: 154 mg/dL — ABNORMAL HIGH (ref <117)

## 2011-09-12 MED ORDER — WARFARIN SODIUM 7.5 MG PO TABS
7.5000 mg | ORAL_TABLET | ORAL | Status: DC
  Administered 2011-09-12: 7.5 mg via ORAL
  Filled 2011-09-12: qty 1

## 2011-09-12 MED ORDER — WARFARIN SODIUM 5 MG PO TABS
5.0000 mg | ORAL_TABLET | ORAL | Status: DC
  Administered 2011-09-13: 5 mg via ORAL
  Filled 2011-09-12 (×2): qty 1

## 2011-09-12 MED ORDER — DILTIAZEM HCL 100 MG IV SOLR
5.0000 mg/h | INTRAVENOUS | Status: AC
  Filled 2011-09-12: qty 100

## 2011-09-12 MED ORDER — DILTIAZEM HCL 30 MG PO TABS
30.0000 mg | ORAL_TABLET | Freq: Four times a day (QID) | ORAL | Status: DC
  Administered 2011-09-12 – 2011-09-13 (×5): 30 mg via ORAL
  Filled 2011-09-12 (×8): qty 1

## 2011-09-12 NOTE — Progress Notes (Signed)
Inpatient Diabetes Program Recommendations  AACE/ADA: New Consensus Statement on Inpatient Glycemic Control (2009)  Target Ranges:  Prepandial:   less than 140 mg/dL      Peak postprandial:   less than 180 mg/dL (1-2 hours)      Critically ill patients:  140 - 180 mg/dL   Reason for Visit: Note history of diabetes.  A1C in process.  CBG's being checked tid wc and HS.  Inpatient Diabetes Program Recommendations Correction (SSI): If CBG's greater than 140 mg/dL, consider adding Novolog sensitive correction.   Note: Will follow.

## 2011-09-12 NOTE — Progress Notes (Signed)
ANTICOAGULATION CONSULT NOTE - Follow Up Consult  Pharmacy Consult for Coumadin Indication: atrial fibrillation  Allergies  Allergen Reactions  . Femara   . Metoprolol   . Tamoxifen   . Tetracyclines & Related   . Vicodin (Hydrocodone-Acetaminophen) Nausea And Vomiting    Patient reports terrible nausea; plain tylenol is ok with patient    Patient Measurements: Height: 5\' 4"  (162.6 cm) Weight: 219 lb (99.338 kg) IBW/kg (Calculated) : 54.7   Vital Signs: Temp: 98 F (36.7 C) (01/18 0530) Temp src: Oral (01/18 0530) BP: 92/55 mmHg (01/18 0530) Pulse Rate: 102  (01/18 0530)  Labs:  Basename 09/12/11 0550 09/11/11 2151 09/11/11 1730 09/11/11 1011 09/11/11 1010  HGB -- -- 11.2* -- 11.1*  HCT -- -- 35.7* -- 35.5*  PLT -- -- 236 -- 210  APTT -- -- 36 -- --  LABPROT 24.5* -- 23.0* -- 23.8*  INR 2.16* -- 2.00* -- 2.09*  HEPARINUNFRC -- -- -- -- --  CREATININE 0.94 1.00 -- -- 0.95  CKTOTAL -- -- 68 -- --  CKMB -- -- 2.3 -- --  TROPONINI -- -- <0.30 <0.30 --   Estimated Creatinine Clearance: 68.3 ml/min (by C-G formula based on Cr of 0.94).    Assessment: Pt admitted for symptomatic A-Fib.  Now converted to SR with Dilt iv change to po and flecanide.  INR 2.16 at goal 2-3.  No bleeding noted, CBC stable. VSS.    Goal of Therapy:  INR 2-3   Plan:  Continue home Coumadin dose 7.5mg  MWF and 5mg  all other days. Daily INR  Marcelino Scot 09/12/2011,1:20 PM

## 2011-09-12 NOTE — Progress Notes (Addendum)
SUBJECTIVE:  Feels well.  Converted to NSR.  Denies any problems on verapamil.  Prefers generic meds as OP.  Near syncope seems to be her symptom associated with AFib.  OBJECTIVE:   Vitals:   Filed Vitals:   09/11/11 1420 09/11/11 1600 09/11/11 2100 09/12/11 0530  BP: 121/80 132/73 134/76 92/55  Pulse: 128 136 109 102  Temp:  98.8 F (37.1 C) 98.1 F (36.7 C) 98 F (36.7 C)  TempSrc:  Oral Oral Oral  Resp:  20 20 20   Height: 5\' 4"  (1.626 m)     Weight: 99.338 kg (219 lb) 99.338 kg (219 lb)    SpO2: 98% 95% 92% 94%   I&O's:   Intake/Output Summary (Last 24 hours) at 09/12/11 1242 Last data filed at 09/12/11 8469  Gross per 24 hour  Intake      3 ml  Output      1 ml  Net      2 ml   TELEMETRY: Reviewed telemetry pt in PAF, currently NSR:     PHYSICAL EXAM General: Well developed, well nourished, in no acute distress Head:    Normal cephalic and atramatic  Lungs:  Clear bilaterally to auscultation and percussion. Heart:   HRRR S1 S2  Abdomen: abdomen soft Msk:  Back normal,  Normal strength and tone for age. Extremities:   No clubbing, cyanosis or edema.  DP +1 Neuro: Alert and oriented X 3. Psych:  Good affect, responds appropriately   LABS: Basic Metabolic Panel:  Basename 09/12/11 0550 09/11/11 2151 09/11/11 1730  NA 140 140 --  K 4.3 4.2 --  CL 104 104 --  CO2 23 24 --  GLUCOSE 143* 126* --  BUN 22 23 --  CREATININE 0.94 1.00 --  CALCIUM 9.5 9.7 --  MG -- -- 1.9  PHOS -- -- --   Liver Function Tests:  [facility] 09/11/11 2151 09/11/11 1010  AST 14 18  ALT 20 21  ALKPHOS 81 85  BILITOT 0.4 0.3  PROT 7.0 6.9  ALBUMIN 3.8 3.5   No results found for this basename: LIPASE:2,AMYLASE:2 in the last 72 hours CBC:  Basename 09/11/11 1730 09/11/11 1010  WBC 8.7 9.7  NEUTROABS 5.4 7.8*  HGB 11.2* 11.1*  HCT 35.7* 35.5*  MCV 60.6* 61.2*  PLT 236 210   Cardiac Enzymes:  Basename 09/11/11 1730 09/11/11 1011  CKTOTAL 68 --  CKMB 2.3 --  CKMBINDEX --  --  TROPONINI <0.30 <0.30   BNP: No components found with this basename: POCBNP:3 D-Dimer: No results found for this basename: DDIMER:2 in the last 72 hours Hemoglobin A1C: No results found for this basename: HGBA1C in the last 72 hours Fasting Lipid Panel: No results found for this basename: CHOL,HDL,LDLCALC,TRIG,CHOLHDL,LDLDIRECT in the last 72 hours Thyroid Function Tests:  Basename 09/11/11 1730  TSH 1.849  T4TOTAL --  T3FREE --  THYROIDAB --   Anemia Panel: No results found for this basename: VITAMINB12,FOLATE,FERRITIN,TIBC,IRON,RETICCTPCT in the last 72 hours Coag Panel:   Lab Results  Component Value Date   INR 2.16* 09/12/2011   INR 2.00* 09/11/2011   INR 2.09* 09/11/2011    RADIOLOGY: Dg Chest 2 View  09/11/2011  *RADIOLOGY REPORT*  Clinical Data: Chest tightness.  Previous smoker.  History of hypertension.  CHEST - 2 VIEW  Comparison: Prior chest x-ray 04/01/2011.  Findings: Lung volumes are normal.  No consolidative airspace disease.  However, there is diffuse prominence of the interstitial markings and cephalization of the pulmonary vasculature.  Findings suggest very mild interstitial pulmonary edema.  Heart size is borderline enlarged.  Mediastinal contours are unremarkable. Atherosclerotic calcifications noted in the arch of the aorta.  IMPRESSION:  1. Borderline cardiomegaly with findings suggestive of mild interstitial pulmonary edema. 2.  Atherosclerosis.  Original Report Authenticated By: Florencia Reasons, M.D.   Ct Head Wo Contrast  09/11/2011  *RADIOLOGY REPORT*  Clinical Data: Dizziness  CT HEAD WITHOUT CONTRAST  Technique:  Contiguous axial images were obtained from the base of the skull through the vertex without contrast.  Comparison: 10/22/2004  Findings: No skull fracture is noted.  Paranasal sinuses and mastoid air cells are unremarkable.  Atherosclerotic calcifications of the carotid siphon are noted.  No intracranial hemorrhage, mass effect or midline  shift.  Mild cerebral atrophy.  No acute infarction.  No mass lesion is noted on this unenhanced scan.  IMPRESSION: No acute intracranial abnormality.  Mild cerebral atrophy.  Original Report Authenticated By: Natasha Mead, M.D.      ASSESSMENT: PAF  PLAN:  Continue Flecainide.  Will stop IV diltiazem and change to PO.  Will use this instead of verapamil.  Discussed with the patient that she should not do any strenuous exercise until she has a treadmill test in the office after being discharged on Flecainide.  She had a negative stress test per Dr. Mayford Knife.  Watch on tele.  Coumadin for stroke prevention.  Diltiazem listed as an allergy but patient denies ever having taken this med before and has not had problems with IV diltiazem.  Of note, she also denied problems with verapamil to me.  Corky Crafts., MD  09/12/2011  12:42 PM

## 2011-09-13 ENCOUNTER — Encounter (HOSPITAL_COMMUNITY): Payer: Self-pay | Admitting: Cardiology

## 2011-09-13 LAB — GLUCOSE, CAPILLARY
Glucose-Capillary: 127 mg/dL — ABNORMAL HIGH (ref 70–99)
Glucose-Capillary: 93 mg/dL (ref 70–99)
Glucose-Capillary: 110 mg/dL — ABNORMAL HIGH (ref 70–99)
Glucose-Capillary: 128 mg/dL — ABNORMAL HIGH (ref 70–99)

## 2011-09-13 LAB — PROTIME-INR
INR: 2.29 — ABNORMAL HIGH (ref 0.00–1.49)
Prothrombin Time: 25.6 seconds — ABNORMAL HIGH (ref 11.6–15.2)

## 2011-09-13 MED ORDER — DILTIAZEM HCL ER 90 MG PO CP12
90.0000 mg | ORAL_CAPSULE | Freq: Two times a day (BID) | ORAL | Status: DC
  Filled 2011-09-13 (×2): qty 1

## 2011-09-13 MED ORDER — DILTIAZEM HCL 90 MG PO TABS
90.0000 mg | ORAL_TABLET | Freq: Two times a day (BID) | ORAL | Status: DC
  Administered 2011-09-13 – 2011-09-14 (×3): 90 mg via ORAL
  Filled 2011-09-13 (×4): qty 1

## 2011-09-13 NOTE — Progress Notes (Signed)
ANTICOAGULATION CONSULT NOTE - Follow Up Consult  Pharmacy Consult for Coumadin Indication: atrial fibrillation  Allergies  Allergen Reactions  . Femara   . Metoprolol   . Tamoxifen   . Tetracyclines & Related   . Vicodin (Hydrocodone-Acetaminophen) Nausea And Vomiting    Patient reports terrible nausea; plain tylenol is ok with patient    Patient Measurements: Height: 5\' 4"  (162.6 cm) Weight: 215 lb 2.7 oz (97.6 kg) IBW/kg (Calculated) : 54.7   Vital Signs: Temp: 98.3 F (36.8 C) (01/19 0601) Temp src: Oral (01/19 0601) BP: 99/62 mmHg (01/19 0601) Pulse Rate: 73  (01/19 0601)  Labs:  Basename 09/13/11 0500 09/12/11 0550 09/11/11 2151 09/11/11 1730 09/11/11 1011 09/11/11 1010  HGB -- -- -- 11.2* -- 11.1*  HCT -- -- -- 35.7* -- 35.5*  PLT -- -- -- 236 -- 210  APTT -- -- -- 36 -- --  LABPROT 25.6* 24.5* -- 23.0* -- --  INR 2.29* 2.16* -- 2.00* -- --  HEPARINUNFRC -- -- -- -- -- --  CREATININE -- 0.94 1.00 -- -- 0.95  CKTOTAL -- -- -- 68 -- --  CKMB -- -- -- 2.3 -- --  TROPONINI -- -- -- <0.30 <0.30 --   Estimated Creatinine Clearance: 67.7 ml/min (by C-G formula based on Cr of 0.94).    Assessment: Pt admitted for symptomatic A-Fib.  Now converted to SR with Dilt iv change to po and flecanide.  INR 2.29 at goal 2-3.  No bleeding noted, CBC stable. VSS.    Goal of Therapy:  INR 2-3   Plan:  Continue home Coumadin dose 7.5mg  MWF and 5mg  all other days. Daily INR  Marcelino Scot 09/13/2011,9:21 AM

## 2011-09-13 NOTE — Progress Notes (Signed)
Subjective:  Anxious and upset over medical history being different.  She  tells me that she had hair loss on metoprolol, but no problems on verapamil or diltiazem.  In sinus rhythm now.  No SOB.  Objective:  Vital Signs in the last 24 hours: BP 99/62  Pulse 73  Temp(Src) 98.3 F (36.8 C) (Oral)  Resp 20  Ht 5\' 4"  (1.626 m)  Wt 97.6 kg (215 lb 2.7 oz)  BMI 36.93 kg/m2  SpO2 96%  Physical Exam: Anxious, obese WF in Nad Lungs:  Clear to A&P Cardiac:  Regular rhythm, normal S1 and S2, no S3 Extremities:  No edema present  Intake/Output from previous day: 01/18 0701 - 01/19 0700 In: 963 [P.O.:960; I.V.:3] Out: 1001 [Urine:1000; Stool:1]  Lab Results: Basic Metabolic Panel:  Basename 09/12/11 0550 09/11/11 2151  NA 140 140  K 4.3 4.2  CL 104 104  CO2 23 24  GLUCOSE 143* 126*  BUN 22 23  CREATININE 0.94 1.00    CBC:  Basename 09/11/11 1730 09/11/11 1010  WBC 8.7 9.7  NEUTROABS 5.4 7.8*  HGB 11.2* 11.1*  HCT 35.7* 35.5*  MCV 60.6* 61.2*  PLT 236 210    PROTIME: Lab Results  Component Value Date   INR 2.29* 09/13/2011   INR 2.16* 09/12/2011   INR 2.00* 09/11/2011    Telemetry: Reviewed   Assessment/Plan:  1. PAF currently in NSR on flecainide and diltiazem  Tolerating it well and may go home in am if stable.     Darden Palmer.  MD [facility] 09/13/2011, 11:58 AM

## 2011-09-14 LAB — GLUCOSE, CAPILLARY: Glucose-Capillary: 129 mg/dL — ABNORMAL HIGH (ref 70–99)

## 2011-09-14 LAB — PROTIME-INR
INR: 2.24 — ABNORMAL HIGH (ref 0.00–1.49)
Prothrombin Time: 25.2 seconds — ABNORMAL HIGH (ref 11.6–15.2)

## 2011-09-14 MED ORDER — DILTIAZEM HCL 90 MG PO TABS: 90.0000 mg | ORAL_TABLET | Freq: Two times a day (BID) | ORAL | Status: DC

## 2011-09-14 MED ORDER — FLECAINIDE ACETATE 50 MG PO TABS: 50.0000 mg | ORAL_TABLET | Freq: Two times a day (BID) | ORAL | Status: DC

## 2011-09-14 NOTE — Progress Notes (Signed)
ANTICOAGULATION CONSULT NOTE - Follow Up Consult  Pharmacy Consult for Coumadin Indication: atrial fibrillation  Allergies  Allergen Reactions  . Femara   . Metoprolol   . Tamoxifen   . Tetracyclines & Related   . Vicodin (Hydrocodone-Acetaminophen) Nausea And Vomiting    Patient reports terrible nausea; plain tylenol is ok with patient    Patient Measurements: Height: 5\' 4"  (162.6 cm) Weight: 215 lb 2.7 oz (97.6 kg) IBW/kg (Calculated) : 54.7   Vital Signs: Temp: 98.3 F (36.8 C) (01/20 0500) BP: 113/65 mmHg (01/20 0500) Pulse Rate: 78  (01/20 0500)  Labs:  Basename 09/14/11 0621 09/13/11 0500 09/12/11 0550 09/11/11 2151 09/11/11 1730 09/11/11 1011 09/11/11 1010  HGB -- -- -- -- 11.2* -- 11.1*  HCT -- -- -- -- 35.7* -- 35.5*  PLT -- -- -- -- 236 -- 210  APTT -- -- -- -- 36 -- --  LABPROT 25.2* 25.6* 24.5* -- -- -- --  INR 2.24* 2.29* 2.16* -- -- -- --  HEPARINUNFRC -- -- -- -- -- -- --  CREATININE -- -- 0.94 1.00 -- -- 0.95  CKTOTAL -- -- -- -- 68 -- --  CKMB -- -- -- -- 2.3 -- --  TROPONINI -- -- -- -- <0.30 <0.30 --   Estimated Creatinine Clearance: 67.7 ml/min (by C-G formula based on Cr of 0.94).    Assessment: Pt admitted for symptomatic A-Fib.  Now converted to SR with Dilt iv change to po and flecanide.  INR 2.24 at goal 2-3.  No bleeding noted, CBC stable. VSS.    Goal of Therapy:  INR 2-3   Plan:  Continue home Coumadin dose 7.5mg  MWF and 5mg  all other days. Daily INR  Marcelino Scot 09/14/2011,8:08 AM

## 2011-09-14 NOTE — Discharge Summary (Addendum)
Physician Discharge Summary  Patient ID: Jenna Mcdonald MRN: [id number] DOB/AGE: April 30, 1946 66 y.o.  Admit date: 09/11/2011 Discharge date: 09/14/2011  Primary Discharge Diagnosis: 1. Paroxysmal atrial fibrillation  Secondary Discharge Diagnosis: 2. Hypertensive heart disease 3. Obesity 4. Esophageal reflux 5. History of thalassemia minor 6. History of breast cancer 7. Long-term anticoagulation with warfarin  Hospital Course: This 66 year-old female has a history of paroxysmal atrial fibrillation and has intolerance to metoprolol previously. She has had increasing frequency and severity of her symptoms despite being on verapamil and was brought to the emergency room with rapid atrial fibrillation and was admitted for further evaluation. The patient converted to sinus rhythm and was started on flecainide. She was changed to diltiazem and this was later changed to a long-acting formulation. She had no recurrent symptoms but did develop some mild sinusitis and some mild nose bleed. Her INR remained therapeutic while she was in the hospital.  She had tolerated the flecainide  well without recurrence and was opted to discharge her home. She'll need to have a followup treadmill test at Dr. Norris Cross office prior to the resumption of strenuous exercise.   Discharge Exam: Blood pressure 113/65, pulse 78, temperature 98.3 F (36.8 C), temperature source Oral, resp. rate 20, height 5\' 4"  (1.626 m), weight 97.6 kg (215 lb 2.7 oz), SpO2 93.00%.   Normal S1-S2 no S3  Labs: CBC:   Lab Results  Component Value Date   WBC 8.7 09/11/2011   HGB 11.2* 09/11/2011   HCT 35.7* 09/11/2011   MCV 60.6* 09/11/2011   PLT 236 09/11/2011   CMP:  Lab 09/12/11 0550 09/11/11 2151  NA 140 --  K 4.3 --  CL 104 --  CO2 23 --  BUN 22 --  CREATININE 0.94 --  CALCIUM 9.5 --  PROT -- 7.0  BILITOT -- 0.4  ALKPHOS -- 81  ALT -- 20  AST -- 14  GLUCOSE 143* --   Lipid Panel     Component Value Date/Time   CHOL  Value: 146        ATP III CLASSIFICATION:  <200     mg/dL   Desirable  409-811  mg/dL   Borderline High  >=914    mg/dL   High 7/82/9562 1308   TRIG 105 05/14/2008 0435   HDL 27* 05/14/2008 0435   CHOLHDL 5.4 05/14/2008 0435   VLDL 21 05/14/2008 0435   LDLCALC  Value: 98        Total Cholesterol/HDL:CHD Risk Coronary Heart Disease Risk Table                     Men   Women  1/2 Average Risk   3.4   3.3 05/14/2008 0435   Cardiac Enzymes:  Basename 09/11/11 1730 09/11/11 1011  CKTOTAL 68 --  CKMB 2.3 --  CKMBINDEX -- --  TROPONINI <0.30 <0.30    EKG: Normal sinus rhythm  Discharge Medications:  [name], [name]  Home Medication Instructions MV[facility]:[id number]   Printed on:09/14/11 0932  Medication Information                    metFORMIN (GLUCOPHAGE) 500 MG tablet Take 500 mg by mouth 2 (two) times daily with a meal.           warfarin (COUMADIN) 5 MG tablet Take 5-7.5 mg by mouth every evening. Mon, Wed, & Fri, take 7.5mg . 5mg  all other days  Calcium Carbonate-Vitamin D (CALCIUM 600 + D PO) Take 1 tablet by mouth 2 (two) times daily.           cholecalciferol (VITAMIN D) 1000 UNITS tablet Take 1,000 Units by mouth daily.           Multiple Vitamin (MULITIVITAMIN WITH MINERALS) TABS Take 1 tablet by mouth daily.           flecainide (TAMBOCOR) 50 MG tablet Take 1 tablet (50 mg total) by mouth every 12 (twelve) hours.           diltiazem (CARDIZEM) 90 MG tablet Take 1 tablet (90 mg total) by mouth every 12 (twelve) hours.             Followup plans and appointments: Followup with Dr. Mayford Knife in one week. She is to use Mucinex and Claritin but not to use over-the-counter decongestants otherwise for sinus problems. We'll need a treadmill test prior to strenuous exercise after has been on flecainide.  Signed: Darden Palmer. MD [facility] 09/14/2011, 9:32 AM

## 2011-10-08 ENCOUNTER — Encounter: Payer: Self-pay | Admitting: Internal Medicine

## 2011-11-11 ENCOUNTER — Encounter: Payer: Self-pay | Admitting: Internal Medicine

## 2011-11-11 ENCOUNTER — Ambulatory Visit (INDEPENDENT_AMBULATORY_CARE_PROVIDER_SITE_OTHER): Payer: BC Managed Care – PPO | Admitting: Internal Medicine

## 2011-11-11 VITALS — BP 130/78 | HR 76 | Ht 64.5 in | Wt 223.0 lb

## 2011-11-11 DIAGNOSIS — I480 Paroxysmal atrial fibrillation: Secondary | ICD-10-CM

## 2011-11-11 DIAGNOSIS — I4891 Unspecified atrial fibrillation: Secondary | ICD-10-CM

## 2011-11-11 MED ORDER — PROPAFENONE HCL ER 225 MG PO CP12: ORAL_CAPSULE | ORAL | Status: DC

## 2011-11-11 MED ORDER — PROPAFENONE HCL 225 MG PO TABS: ORAL_TABLET | ORAL | Status: DC

## 2011-11-11 NOTE — Progress Notes (Signed)
HPI Jenna Mcdonald is referred today by Dr. Mayford Knife for evaluation of paroxysmal atrial fibrillation. The patient is 66 years old and has a history of hypertension and was recently diagnosed with sleep apnea. She has obesity. She notes that her episodes of palpitations began several years ago. These were associated with nausea shortness of breath and a sensation that she was "paralyzed." She was hospitalized in January with atrial fibrillation and was begun on flecainide. She experienced visual difficulties. On discontinuation of the flecainide however her blurred vision did not improve much. Her vision is blurry on an intermittent basis since then. Since her hospitalization in January, she has had no symptomatic atrial fibrillation. She is fairly anxious. She gives a history of a stress test demonstrating no ischemia. She has preserved left ventricular function by echo. She has never had frank syncope. Allergies  Allergen Reactions  . Letrozole   . Metoprolol   . Tamoxifen   . Tetracyclines & Related   . Vicodin (Hydrocodone-Acetaminophen) Nausea And Vomiting    Patient reports terrible nausea; plain tylenol is ok with patient     Current Outpatient Prescriptions  Medication Sig Dispense Refill  . Calcium Carbonate-Vitamin D (CALCIUM 600 + D PO) Take 1 tablet by mouth 2 (two) times daily.      . cholecalciferol (VITAMIN D) 1000 UNITS tablet Take 1,000 Units by mouth daily.      Marland Kitchen diltiazem (CARDIZEM) 90 MG tablet Take 1 tablet (90 mg total) by mouth every 12 (twelve) hours.  60 tablet  12  . metFORMIN (GLUCOPHAGE) 500 MG tablet Take 500 mg by mouth 2 (two) times daily with a meal.      . Multiple Vitamin (MULITIVITAMIN WITH MINERALS) TABS Take 1 tablet by mouth daily.      Marland Kitchen warfarin (COUMADIN) 5 MG tablet Take 5-7.5 mg by mouth every evening. Mon, Wed, & Fri, take 7.5mg . 5mg  all other days      . propafenone (RYTHMOL SR) 225 MG 12 hr capsule Take as directed for afib 1 at onset of afib and  another 2 hours after if needed  30 capsule  11  . propafenone (RYTHMOL) 225 MG tablet Take 1 at onset of afib and another 2 hour after if still out  30 tablet  11     Past Medical History  Diagnosis Date  . Diabetes mellitus   . A-fib   . Breast CA   . Obesity   . Gastroesophageal reflux disease   . Hypertension   . Irritable bowel   . Osteoarthritis   . Degenerative disc disease, lumbar   . PONV (postoperative nausea and vomiting)   . Pneumonia   . Anemia   . Insomnia   . Thalassemia minor   . Empty sella syndrome     ROS:   All systems reviewed and negative except as noted in the HPI.   Past Surgical History  Procedure Date  . Replacement total knee   . Tonsillectomy   . Dilation and curettage of uterus   . Replacement total knee   . Tubal ligation   . Breast surgery     lumpectomy  . Cystectomy   . Oophorectomy      Family History  Problem Relation Age of Onset  . Anesthesia problems Mother      History   Social History  . Marital Status: Single    Spouse Name: N/A    Number of Children: N/A  . Years of Education: N/A  Occupational History  . Not on file.   Social History Main Topics  . Smoking status: Former Smoker -- 10 years    Types: Cigarettes    Quit date: 11/12/2003  . Smokeless tobacco: Not on file  . Alcohol Use: No  . Drug Use: No  . Sexually Active: No   Other Topics Concern  . Not on file   Social History Narrative  . No narrative on file     BP 130/78  Pulse 76  Ht 5' 4.5" (1.638 m)  Wt 101.152 kg (223 lb)  BMI 37.69 kg/m2  Physical Exam:  Well appearing obese, middle-aged woman, NAD HEENT: Unremarkable Neck:  No JVD, no thyromegally Lungs:  Clear with no wheezes, rales, or rhonchi. HEART:  Regular rate rhythm, no murmurs, no rubs, no clicks Abd:  soft, positive bowel sounds, no organomegally, no rebound, no guarding Ext:  2 plus pulses, no edema, no cyanosis, no clubbing Skin:  No rashes no nodules Neuro:   CN II through XII intact, motor grossly intact  EKG Normal sinus rhythm left atrial enlargement  Assess/Plan:

## 2011-11-11 NOTE — Assessment & Plan Note (Signed)
I discussed the treatment options with the patient. Since her hospitalization, she has not had any symptomatic atrial fibrillation. In addition, she has been diagnosed with sleep apnea. Treatment of her sleep apnea may well help reduce the amount of atrial fibrillation she has. On the other hand, the patient has been very symptomatic In atrial fibrillation and states that she wants to do everything possible to avoid another episode particularly if it involves being hospitalized. Because she has been unable to take flecainide, I recommended a trial of Propafenone taken on an as-needed basis. A prescription today for 225 mg has been given. I'll see the patient back in several months to see how she's doing. If she has questions regarding this medication, she is instructed to call her office.

## 2011-11-11 NOTE — Patient Instructions (Signed)
Your physician recommends that you schedule a follow-up appointment in: 6 weeks with Dr Ladona Ridgel  Your physician has recommended you make the following change in your medication:  1) Propafenone 250mg  as needed

## 2011-12-11 ENCOUNTER — Encounter: Payer: Self-pay | Admitting: Internal Medicine

## 2011-12-12 ENCOUNTER — Other Ambulatory Visit: Payer: Self-pay | Admitting: Oncology

## 2011-12-12 DIAGNOSIS — Z9889 Other specified postprocedural states: Secondary | ICD-10-CM

## 2011-12-12 DIAGNOSIS — Z853 Personal history of malignant neoplasm of breast: Secondary | ICD-10-CM

## 2011-12-31 ENCOUNTER — Ambulatory Visit: Payer: BC Managed Care – PPO | Admitting: Internal Medicine

## 2012-01-14 ENCOUNTER — Ambulatory Visit (INDEPENDENT_AMBULATORY_CARE_PROVIDER_SITE_OTHER): Payer: BC Managed Care – PPO | Admitting: Internal Medicine

## 2012-01-14 ENCOUNTER — Encounter: Payer: Self-pay | Admitting: Internal Medicine

## 2012-01-14 VITALS — BP 118/68 | HR 75 | Ht 65.0 in | Wt 231.0 lb

## 2012-01-14 DIAGNOSIS — I10 Essential (primary) hypertension: Secondary | ICD-10-CM

## 2012-01-14 DIAGNOSIS — I480 Paroxysmal atrial fibrillation: Secondary | ICD-10-CM

## 2012-01-14 DIAGNOSIS — I4891 Unspecified atrial fibrillation: Secondary | ICD-10-CM

## 2012-01-14 NOTE — Patient Instructions (Signed)
Your physician wants you to follow-up in: 9 months with Dr Taylor.  You will receive a reminder letter in the mail two months in advance. If you don't receive a letter, please call our office to schedule the follow-up appointment.  

## 2012-01-14 NOTE — Assessment & Plan Note (Signed)
She has had no symptomatic atrial fibrillation since we last saw the patient and she has not required any Rythmol. I've asked the patient to continue to have this medication available. We'll see her back in several months.

## 2012-01-14 NOTE — Progress Notes (Signed)
HPI Jenna Mcdonald returns today for followup. She is a very pleasant middle-age woman with obesity, hypertension, and palpitations secondary to paroxysmal atrial fibrillation. When I saw her last I recommended that she take as needed Rythmol. Since then she has had no recurrent arrhythmias. About a week after her clinic visit, she started on BiPAP. Unfortunate she continues to have problems with peripheral edema but has had no syncope cough or hemoptysis. She does not think she feels any better during the day after she has taken the BiPAP at night. She does note that she tries not to eat too much salt. Allergies  Allergen Reactions  . Letrozole   . Metoprolol   . Tamoxifen   . Tetracyclines & Related   . Vicodin (Hydrocodone-Acetaminophen) Nausea And Vomiting    Patient reports terrible nausea; plain tylenol is ok with patient     Current Outpatient Prescriptions  Medication Sig Dispense Refill  . Calcium Carbonate-Vitamin D (CALCIUM 600 + D PO) Take 1 tablet by mouth 2 (two) times daily.      . cholecalciferol (VITAMIN D) 1000 UNITS tablet Take 1,000 Units by mouth daily.      Marland Kitchen diltiazem (CARDIZEM) 90 MG tablet Take 1 tablet (90 mg total) by mouth every 12 (twelve) hours.  60 tablet  12  . metFORMIN (GLUCOPHAGE) 500 MG tablet Take 500 mg by mouth 2 (two) times daily with a meal.      . Multiple Vitamin (MULITIVITAMIN WITH MINERALS) TABS Take 1 tablet by mouth daily.      . propafenone (RYTHMOL SR) 225 MG 12 hr capsule Take as directed for afib 1 at onset of afib and another 2 hours after if needed  30 capsule  11  . warfarin (COUMADIN) 5 MG tablet Take 5-7.5 mg by mouth every evening. Mon, Wed, & Fri, take 7.5mg . 5mg  all other days      . DISCONTD: flecainide (TAMBOCOR) 50 MG tablet Take 1 tablet (50 mg total) by mouth every 12 (twelve) hours.  60 tablet  12     Past Medical History  Diagnosis Date  . Diabetes mellitus   . A-fib   . Breast CA   . Obesity   . Gastroesophageal reflux  disease   . Hypertension   . Irritable bowel   . Osteoarthritis   . Degenerative disc disease, lumbar   . PONV (postoperative nausea and vomiting)   . Pneumonia   . Anemia   . Insomnia   . Thalassemia minor   . Empty sella syndrome     ROS:   All systems reviewed and negative except as noted in the HPI.   Past Surgical History  Procedure Date  . Replacement total knee   . Tonsillectomy   . Dilation and curettage of uterus   . Replacement total knee   . Tubal ligation   . Breast surgery     lumpectomy  . Cystectomy   . Oophorectomy      Family History  Problem Relation Age of Onset  . Anesthesia problems Mother      History   Social History  . Marital Status: Single    Spouse Name: N/A    Number of Children: N/A  . Years of Education: N/A   Occupational History  . Not on file.   Social History Main Topics  . Smoking status: Former Smoker -- 10 years    Types: Cigarettes    Quit date: 11/12/2003  . Smokeless tobacco: Not on file  .  Alcohol Use: No  . Drug Use: No  . Sexually Active: No   Other Topics Concern  . Not on file   Social History Narrative  . No narrative on file     BP 118/68  Pulse 75  Ht 5\' 5"  (1.651 m)  Wt 231 lb (104.781 kg)  BMI 38.44 kg/m2  Physical Exam:  Well appearing middle-aged woman, NAD HEENT: Unremarkable Neck:  No JVD, no thyromegally Lungs:  Clear with no wheezes, rales, or rhonchi. HEART:  Regular rate rhythm, no murmurs, no rubs, no clicks Abd:  soft, positive bowel sounds, no organomegally, no rebound, no guarding Ext:  2 plus pulses, no edema, no cyanosis, no clubbing Skin:  No rashes no nodules Neuro:  CN II through XII intact, motor grossly intact  EKG Normal sinus rhythm  Assess/Plan:

## 2012-01-14 NOTE — Assessment & Plan Note (Signed)
Her blood pressure is well controlled today. She will continue her current medications and I cautioned her to try and keep her salt intake reduced.

## 2012-05-04 ENCOUNTER — Ambulatory Visit
Admission: RE | Admit: 2012-05-04 | Discharge: 2012-05-04 | Disposition: A | Discharge status: Home / Self Care | Payer: BC Managed Care – PPO | Source: Ambulatory Visit | Attending: Oncology | Admitting: Oncology

## 2012-05-04 DIAGNOSIS — Z853 Personal history of malignant neoplasm of breast: Secondary | ICD-10-CM

## 2012-05-04 DIAGNOSIS — Z9889 Other specified postprocedural states: Secondary | ICD-10-CM

## 2012-05-11 ENCOUNTER — Other Ambulatory Visit (HOSPITAL_BASED_OUTPATIENT_CLINIC_OR_DEPARTMENT_OTHER): Payer: BC Managed Care – PPO

## 2012-05-11 DIAGNOSIS — C50419 Malignant neoplasm of upper-outer quadrant of unspecified female breast: Secondary | ICD-10-CM

## 2012-05-11 LAB — COMPREHENSIVE METABOLIC PANEL (CC13)
ALT: 30 U/L (ref 0–55)
AST: 18 U/L (ref 5–34)
Albumin: 3.9 g/dL (ref 3.5–5.0)
Alkaline Phosphatase: 96 U/L (ref 40–150)
BUN: 21 mg/dL (ref 7.0–26.0)
CO2: 22 mEq/L (ref 22–29)
Calcium: 9.1 mg/dL (ref 8.4–10.4)
Chloride: 106 mEq/L (ref 98–107)
Creatinine: 1.1 mg/dL (ref 0.6–1.1)
Glucose: 218 mg/dl — ABNORMAL HIGH (ref 70–99)
Potassium: 4.1 mEq/L (ref 3.5–5.1)
Sodium: 139 mEq/L (ref 136–145)
Total Bilirubin: 0.5 mg/dL (ref 0.20–1.20)
Total Protein: 7.1 g/dL (ref 6.4–8.3)

## 2012-05-11 LAB — CBC WITH DIFFERENTIAL/PLATELET
BASO%: 0.7 % (ref 0.0–2.0)
Basophils Absolute: 0.1 10*3/uL (ref 0.0–0.1)
EOS%: 3.7 % (ref 0.0–7.0)
Eosinophils Absolute: 0.3 10*3/uL (ref 0.0–0.5)
HCT: 34.6 % — ABNORMAL LOW (ref 34.8–46.6)
HGB: 11 g/dL — ABNORMAL LOW (ref 11.6–15.9)
LYMPH%: 31.2 % (ref 14.0–49.7)
MCH: 19.8 pg — ABNORMAL LOW (ref 25.1–34.0)
MCHC: 31.9 g/dL (ref 31.5–36.0)
MCV: 62.1 fL — ABNORMAL LOW (ref 79.5–101.0)
MONO#: 0.5 10*3/uL (ref 0.1–0.9)
MONO%: 6.7 % (ref 0.0–14.0)
NEUT#: 4.1 10*3/uL (ref 1.5–6.5)
NEUT%: 57.7 % (ref 38.4–76.8)
Platelets: 187 10*3/uL (ref 145–400)
RBC: 5.58 10*6/uL — ABNORMAL HIGH (ref 3.70–5.45)
RDW: 16.9 % — ABNORMAL HIGH (ref 11.2–14.5)
WBC: 7.2 10*3/uL (ref 3.9–10.3)
lymph#: 2.2 10*3/uL (ref 0.9–3.3)

## 2012-05-11 LAB — LACTATE DEHYDROGENASE (CC13): LDH: 218 U/L (ref 125–220)

## 2012-05-13 ENCOUNTER — Other Ambulatory Visit: Payer: Self-pay | Admitting: Oncology

## 2012-05-17 ENCOUNTER — Other Ambulatory Visit: Payer: Self-pay | Admitting: *Deleted

## 2012-05-17 ENCOUNTER — Ambulatory Visit (HOSPITAL_BASED_OUTPATIENT_CLINIC_OR_DEPARTMENT_OTHER): Payer: BC Managed Care – PPO | Admitting: Oncology

## 2012-05-17 VITALS — BP 162/78 | HR 86 | Temp 97.1°F | Resp 18 | Ht 65.0 in | Wt 232.5 lb

## 2012-05-17 DIAGNOSIS — C50419 Malignant neoplasm of upper-outer quadrant of unspecified female breast: Secondary | ICD-10-CM

## 2012-05-17 DIAGNOSIS — G4730 Sleep apnea, unspecified: Secondary | ICD-10-CM

## 2012-05-17 DIAGNOSIS — M549 Dorsalgia, unspecified: Secondary | ICD-10-CM

## 2012-05-17 DIAGNOSIS — G8929 Other chronic pain: Secondary | ICD-10-CM

## 2012-05-17 DIAGNOSIS — C50919 Malignant neoplasm of unspecified site of unspecified female breast: Secondary | ICD-10-CM

## 2012-05-17 NOTE — Progress Notes (Signed)
Hematology and Oncology Follow Up Visit  Jenna Mcdonald [id number] 03/05/1946 66 y.o. 05/17/2012 7:21 PM   Principle Diagnosis: Encounter Diagnosis  Name Primary?  . 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  She was unable to tolerate any of the aromatase inhibitors or tamoxifen.   Overall doing well. She reports that she started to have spells in the morning which sounded like hypoglycemia spells to me but she is diabetic. Her cardiologist thought that her symptoms might be a sign of sleep apnea. She had a sleep study and ,in fact ,she was diagnosed with sleep apnea. She was started on CPAP and has had a dramatic improvement with resolution of previous symptoms. Cardiologist felt that the nighttime hypoxia was likely exacerbating her atrial arrhythmias.  She is having chronic back pain and is getting the most relief by massage therapy. No new or focal bone pain. No change in bowel habits. No vaginal bleeding. No headache. She has had some intermittent blurred vision. She has not try to correlate this with her blood sugar levels.  Medications: reviewed  Allergies:  Allergies  Allergen Reactions  . Letrozole   . Metoprolol   . Tamoxifen   . Tetracyclines & Related   . Vicodin (Hydrocodone-Acetaminophen) Nausea And Vomiting    Patient reports terrible nausea; plain tylenol is ok with patient    Review of Systems: Constitutional:   No constitutional symptoms Respiratory: No cough or dyspnea. Newly diagnosed sleep apnea syndrome. See discussion above. Cardiovascular:  No recent chest pain or palpitations. Gastrointestinal: See above Genito-Urinary: See above Musculoskeletal: See above Neurologic: See above Skin: No rash or  ecchymosis Remaining ROS negative.  Physical Exam: Blood pressure 162/78, pulse 86, temperature 97.1 F (36.2 C), temperature source Oral, resp. rate 18, height 5\' 5"  (1.651 m), weight 232 lb 8 oz (105.461 kg). Wt Readings from Last 3 Encounters:  05/17/12 232 lb 8 oz (105.461 kg)  01/14/12 231 lb (104.781 kg)  11/11/11 223 lb (101.152 kg)     General appearance: Obese Caucasian woman HENNT: Pharynx no erythema or exudate Lymph nodes: No cervical, supraclavicular, or axillary adenopathy Breasts: Large breasted, surgical changes on the left, no dominant mass in either breast Lungs: Clear to auscultation resonant to percussion Heart: Regular rhythm no murmur or gallop Abdomen: Soft obese, nontender, no mass, no organomegaly Extremities: No edema no calf tenderness Vascular: No cyanosis Neurologic: Mental status intact, cranial nerves intact, motor strength 5 over 5 Skin: No rash or ecchymosis  Lab Results: Lab Results  Component Value Date   WBC 7.2 05/11/2012   HGB 11.0* 05/11/2012   HCT 34.6* 05/11/2012   MCV 62.1* 05/11/2012   PLT 187 05/11/2012     Chemistry      Component Value Date/Time   NA 139 05/11/2012 1632   NA 140 09/12/2011 0550   K 4.1 05/11/2012 1632   K 4.3 09/12/2011 0550   CL 106 05/11/2012 1632   CL 104 09/12/2011 0550   CO2 22 05/11/2012 1632   CO2 23 09/12/2011 0550   BUN 21.0 05/11/2012 1632   BUN 22 09/12/2011 0550   CREATININE 1.1 05/11/2012 1632   CREATININE 0.94 09/12/2011 0550      Component Value Date/Time   CALCIUM 9.1 05/11/2012 1632  CALCIUM 9.5 09/12/2011 0550   ALKPHOS 96 05/11/2012 1632   ALKPHOS 81 09/11/2011 2151   AST 18 05/11/2012 1632   AST 14 09/11/2011 2151   ALT 30 05/11/2012 1632   ALT 20 09/11/2011 2151   BILITOT 0.50 05/11/2012 1632   BILITOT 0.4 09/11/2011 2151       Radiological Studies: Mm Digital Diagnostic Bilat  05/04/2012  *RADIOLOGY REPORT*  Clinical Data:  Patient presents for bilateral diagnostic mammogram due to a history  of a prior left malignant lumpectomy 2010.  DIGITAL DIAGNOSTIC BILATERAL MAMMOGRAM WITH CAD  Comparison:  05/02/2011, 04/30/2010 and 04/27/2009  Findings:  Exam demonstrates fatty replaced fibroglandular tissue. There are post lumpectomy changes of the upper outer left breast. There are subtle calcifications at the lumpectomy site likely dystrophic/fat necrosis as the prior exam suggests early oil cyst formation in this area.  Remainder of the exam is unchanged. Mammographic images were processed with CAD.  IMPRESSION: Post lumpectomy changes of the upper outer left breast with calcifications at the lumpectomy site suggesting early fat necrosis.  RECOMMENDATION: Recommend a 4-month follow-up diagnostic left breast mammogram to document stability.  BI-RADS CATEGORY 3:  Probably benign finding(s) - short interval follow-up suggested.   Original Report Authenticated By: Elba Barman, M.D.     Impression and Plan:  #1. Stage I ER/PR positive HER-2 negative cancer of the left breast. There were some changes on mammogram outlined above. Radiologist stated probably benign but recommended 6 month interval followup on the left breast. I will schedule a followup mammogram for March 2014.  #2. Atrial fibrillation  #3. Sleep apnea syndrome  #4. Hyperdense occasion  #5. Type 2 diabetes  #6. 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  #8 essential hypertension     CC:. Dr. Candyce Churn;  Dr. Armanda Magic; Dr. Claud Kelp; Dr. Olivia Mackie   Levert Feinstein, MD 9/23/20137:21 PM

## 2012-05-18 ENCOUNTER — Telehealth: Payer: Self-pay | Admitting: Oncology

## 2012-05-18 NOTE — Telephone Encounter (Signed)
Called pt and left message regarding appt for Mammogram and labs in September 2014

## 2012-05-25 ENCOUNTER — Telehealth: Payer: Self-pay | Admitting: Oncology

## 2012-05-25 ENCOUNTER — Other Ambulatory Visit: Payer: Self-pay | Admitting: Oncology

## 2012-05-25 NOTE — Telephone Encounter (Signed)
Called pt regarding appt for September 2014 lab before MD visit and see MD after a week, also reminded patient on her mammogram appt for 2014 scheduled for March

## 2012-11-03 ENCOUNTER — Ambulatory Visit
Admission: RE | Admit: 2012-11-03 | Discharge: 2012-11-03 | Disposition: A | Discharge status: Home / Self Care | Payer: Medicare PPO | Source: Ambulatory Visit | Attending: Oncology | Admitting: Oncology

## 2012-11-03 DIAGNOSIS — C50919 Malignant neoplasm of unspecified site of unspecified female breast: Secondary | ICD-10-CM

## 2012-11-12 ENCOUNTER — Other Ambulatory Visit: Payer: Self-pay | Admitting: Oncology

## 2012-11-12 DIAGNOSIS — C50912 Malignant neoplasm of unspecified site of left female breast: Secondary | ICD-10-CM

## 2012-11-16 ENCOUNTER — Other Ambulatory Visit (HOSPITAL_COMMUNITY)
Admission: RE | Admit: 2012-11-16 | Discharge: 2012-11-16 | Disposition: A | Discharge status: Home / Self Care | Payer: Medicare PPO | Source: Ambulatory Visit | Attending: Internal Medicine | Admitting: Internal Medicine

## 2012-11-16 ENCOUNTER — Telehealth: Payer: Self-pay | Admitting: Oncology

## 2012-11-16 ENCOUNTER — Other Ambulatory Visit: Payer: Self-pay | Admitting: Internal Medicine

## 2012-11-16 DIAGNOSIS — Z1151 Encounter for screening for human papillomavirus (HPV): Secondary | ICD-10-CM | POA: Insufficient documentation

## 2012-11-16 DIAGNOSIS — Z124 Encounter for screening for malignant neoplasm of cervix: Secondary | ICD-10-CM | POA: Insufficient documentation

## 2012-11-16 NOTE — Telephone Encounter (Signed)
The pt is scheduled for her mammogram on 05/09/2013 at the [facility]. lmonvm with the d/t of the appt.

## 2013-01-25 ENCOUNTER — Other Ambulatory Visit: Payer: Self-pay | Admitting: Internal Medicine

## 2013-01-25 DIAGNOSIS — M5416 Radiculopathy, lumbar region: Secondary | ICD-10-CM

## 2013-01-30 ENCOUNTER — Ambulatory Visit
Admission: RE | Admit: 2013-01-30 | Discharge: 2013-01-30 | Disposition: A | Discharge status: Home / Self Care | Payer: Medicare PPO | Source: Ambulatory Visit | Attending: Internal Medicine | Admitting: Internal Medicine

## 2013-01-30 DIAGNOSIS — M5416 Radiculopathy, lumbar region: Secondary | ICD-10-CM

## 2013-05-04 ENCOUNTER — Telehealth: Payer: Self-pay | Admitting: Oncology

## 2013-05-04 NOTE — Telephone Encounter (Signed)
Pt wants to r/s lab and Md to 11/4 pt will be out of town before that

## 2013-05-09 ENCOUNTER — Ambulatory Visit
Admission: RE | Admit: 2013-05-09 | Discharge: 2013-05-09 | Disposition: A | Discharge status: Home / Self Care | Payer: Medicare PPO | Source: Ambulatory Visit | Attending: Oncology | Admitting: Oncology

## 2013-05-09 DIAGNOSIS — C50912 Malignant neoplasm of unspecified site of left female breast: Secondary | ICD-10-CM

## 2013-05-16 ENCOUNTER — Other Ambulatory Visit (HOSPITAL_BASED_OUTPATIENT_CLINIC_OR_DEPARTMENT_OTHER): Payer: Medicare PPO | Admitting: Lab

## 2013-05-16 ENCOUNTER — Other Ambulatory Visit: Payer: BC Managed Care – PPO | Admitting: Lab

## 2013-05-16 DIAGNOSIS — C50919 Malignant neoplasm of unspecified site of unspecified female breast: Secondary | ICD-10-CM

## 2013-05-16 LAB — COMPREHENSIVE METABOLIC PANEL (CC13)
ALT: 42 U/L (ref 0–55)
AST: 31 U/L (ref 5–34)
Albumin: 3.5 g/dL (ref 3.5–5.0)
Alkaline Phosphatase: 81 U/L (ref 40–150)
BUN: 14.5 mg/dL (ref 7.0–26.0)
CO2: 25 mEq/L (ref 22–29)
Calcium: 9.7 mg/dL (ref 8.4–10.4)
Chloride: 104 mEq/L (ref 98–109)
Creatinine: 1.1 mg/dL (ref 0.6–1.1)
Glucose: 277 mg/dl — ABNORMAL HIGH (ref 70–140)
Potassium: 4.5 mEq/L (ref 3.5–5.1)
Sodium: 140 mEq/L (ref 136–145)
Total Bilirubin: 0.65 mg/dL (ref 0.20–1.20)
Total Protein: 6.9 g/dL (ref 6.4–8.3)

## 2013-05-16 LAB — CBC WITH DIFFERENTIAL/PLATELET
BASO%: 0.7 % (ref 0.0–2.0)
Basophils Absolute: 0.1 10*3/uL (ref 0.0–0.1)
EOS%: 1.2 % (ref 0.0–7.0)
Eosinophils Absolute: 0.1 10*3/uL (ref 0.0–0.5)
HCT: 35.8 % (ref 34.8–46.6)
HGB: 11.1 g/dL — ABNORMAL LOW (ref 11.6–15.9)
LYMPH%: 23.1 % (ref 14.0–49.7)
MCH: 19.1 pg — ABNORMAL LOW (ref 25.1–34.0)
MCHC: 31.1 g/dL — ABNORMAL LOW (ref 31.5–36.0)
MCV: 61.6 fL — ABNORMAL LOW (ref 79.5–101.0)
MONO#: 0.4 10*3/uL (ref 0.1–0.9)
MONO%: 6.2 % (ref 0.0–14.0)
NEUT#: 4.9 10*3/uL (ref 1.5–6.5)
NEUT%: 68.8 % (ref 38.4–76.8)
Platelets: 197 10*3/uL (ref 145–400)
RBC: 5.82 10*6/uL — ABNORMAL HIGH (ref 3.70–5.45)
RDW: 16.4 % — ABNORMAL HIGH (ref 11.2–14.5)
WBC: 7.1 10*3/uL (ref 3.9–10.3)
lymph#: 1.6 10*3/uL (ref 0.9–3.3)

## 2013-05-23 ENCOUNTER — Ambulatory Visit: Payer: BC Managed Care – PPO | Admitting: Oncology

## 2013-06-20 ENCOUNTER — Ambulatory Visit: Payer: Medicare PPO | Admitting: Oncology

## 2013-06-20 ENCOUNTER — Other Ambulatory Visit: Payer: Medicare PPO | Admitting: Lab

## 2013-06-28 ENCOUNTER — Other Ambulatory Visit: Payer: Medicare PPO | Admitting: Lab

## 2013-06-28 ENCOUNTER — Ambulatory Visit (HOSPITAL_BASED_OUTPATIENT_CLINIC_OR_DEPARTMENT_OTHER): Payer: Medicare PPO | Admitting: Oncology

## 2013-06-28 ENCOUNTER — Encounter (INDEPENDENT_AMBULATORY_CARE_PROVIDER_SITE_OTHER): Payer: Self-pay

## 2013-06-28 VITALS — BP 145/69 | HR 102 | Temp 97.9°F | Resp 20 | Ht 65.0 in | Wt 244.5 lb

## 2013-06-28 DIAGNOSIS — C50919 Malignant neoplasm of unspecified site of unspecified female breast: Secondary | ICD-10-CM

## 2013-06-28 DIAGNOSIS — C50912 Malignant neoplasm of unspecified site of left female breast: Secondary | ICD-10-CM

## 2013-06-28 DIAGNOSIS — I4891 Unspecified atrial fibrillation: Secondary | ICD-10-CM

## 2013-06-29 ENCOUNTER — Telehealth: Payer: Self-pay | Admitting: *Deleted

## 2013-06-29 NOTE — Telephone Encounter (Signed)
sw pt gv appt for mammo 05/10/14 @ 2:45pm , labs/ ov on 06/29/14 @ 3pm..the patient is aware...td

## 2013-06-29 NOTE — Progress Notes (Signed)
Hematology and Oncology Follow Up Visit  Jenna Mcdonald [id number] [birth year] 67 y.o. 06/29/2013 10:59 AM   Principle Diagnosis: Encounter Diagnosis  Name Primary?  . 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 She was unable to tolerate any of the aromatase inhibitors or tamoxifen and has been followed with observation alone. She just had a followup mammogram on 05/09/2013 which shows no new disease in either breast. She reminded me today that she has a sister who had breast cancer at approximately age 14. She has 2 sons. No daughters. We talked about genetic testing. She got her cancer at age 56 and this is likely an age-related phenomenon. I think her sons are at low risk. I think of anybody should get tested it would be her sister but even so, her cancer was probably more age related than genetically determined.  She is having ongoing problems with her back and may need to have surgery. No other focal bone pain. No headaches or change in vision. No vaginal bleeding.     Medications: reviewed  Allergies:  Allergies  Allergen Reactions  . Letrozole   . Metoprolol   . Tamoxifen   . Tetracyclines & Related   . Vicodin [Hydrocodone-Acetaminophen] Nausea And Vomiting    Patient reports terrible nausea; plain tylenol is ok with patient    Review of Systems:  ENT ROS: No sore throat Breast ROS: She has large breasts but has not felt any palpable abnormalities Respiratory ROS: No cough or dyspnea Cardiovascular ROS:   No chest pain or palpitations Gastrointestinal ROS:   No abdominal pain or change in bowel habit Genito-Urinary ROS: See above Musculoskeletal ROS: See above Neurological ROS: See above Dermatological ROS: No rash or  ecchymosis Remaining ROS negative.  Physical Exam: Blood pressure 145/69, pulse 102, temperature 97.9 F (36.6 C), temperature source Oral, resp. rate 20, height 5\' 5"  (1.651 m), weight 244 lb 8 oz (110.904 kg). Wt Readings from Last 3 Encounters:  06/28/13 244 lb 8 oz (110.904 kg)  05/17/12 232 lb 8 oz (105.461 kg)  01/14/12 231 lb (104.781 kg)     General appearance: Overweight Caucasian woman HENNT: Pharynx no erythema, exudate, mass, or ulcer. No thyromegaly or thyroid nodules Lymph nodes: No cervical, supraclavicular, or axillary lymphadenopathy Breasts: No abnormal skin changes, large breasted, no dominant mass in either breast Lungs: Clear to auscultation, resonant to percussion throughout Heart: Regular rhythm, no murmur, no gallop, no rub, no click, no edema Abdomen: Soft, nontender, normal bowel sounds, no mass, no organomegaly Extremities: No edema, no calf tenderness Musculoskeletal: no joint deformities GU:  Vascular: Carotid pulses 2+, no bruits, distal pulses:  Neurologic: Alert, oriented, PERRLA, , cranial nerves grossly normal, motor strength 5 over 5, reflexes 1+ symmetric, upper body coordination normal, gait normal, Skin: No rash or ecchymosis  Lab Results: CBC W/Diff    Component Value Date/Time   WBC 7.1 05/16/2013 0847   WBC 8.7 09/11/2011 1730   RBC 5.82* 05/16/2013 0847   RBC 5.89* 09/11/2011 1730   HGB 11.1* 05/16/2013 0847   HGB 11.2* 09/11/2011 1730   HCT 35.8 05/16/2013 0847   HCT 35.7* 09/11/2011 1730   PLT 197 05/16/2013 0847   PLT 236 09/11/2011 1730   MCV 61.6* 05/16/2013 0847  MCV 60.6* 09/11/2011 1730   MCH 19.1* 05/16/2013 0847   MCH 19.0* 09/11/2011 1730   MCHC 31.1* 05/16/2013 0847   MCHC 31.4 09/11/2011 1730   RDW 16.4* 05/16/2013 0847   RDW 16.3* 09/11/2011 1730   LYMPHSABS 1.6 05/16/2013 0847   LYMPHSABS 2.7 09/11/2011 1730   MONOABS 0.4 05/16/2013 0847   MONOABS 0.4 09/11/2011 1730   EOSABS 0.1 05/16/2013 0847   EOSABS 0.1 09/11/2011 1730    BASOSABS 0.1 05/16/2013 0847   BASOSABS 0.1 09/11/2011 1730     Chemistry      Component Value Date/Time   NA 140 05/16/2013 0847   NA 140 09/12/2011 0550   K 4.5 05/16/2013 0847   K 4.3 09/12/2011 0550   CL 106 05/11/2012 1632   CL 104 09/12/2011 0550   CO2 25 05/16/2013 0847   CO2 23 09/12/2011 0550   BUN 14.5 05/16/2013 0847   BUN 22 09/12/2011 0550   CREATININE 1.1 05/16/2013 0847   CREATININE 0.94 09/12/2011 0550      Component Value Date/Time   CALCIUM 9.7 05/16/2013 0847   CALCIUM 9.5 09/12/2011 0550   ALKPHOS 81 05/16/2013 0847   ALKPHOS 81 09/11/2011 2151   AST 31 05/16/2013 0847   AST 14 09/11/2011 2151   ALT 42 05/16/2013 0847   ALT 20 09/11/2011 2151   BILITOT 0.65 05/16/2013 0847   BILITOT 0.4 09/11/2011 2151       Radiological Studies: See discussion above   Impression:  #1. Stage I, ER positive, cancer of the left breast She remains free of any recurrence now out 4 years from diagnosis. I told her that typically I let patients with stage I, ER positive breast cancers graduate from our practice at 5 years. She would like to be seen again next year. I will schedule her with Dr. Darnelle Catalan.  #2. Thalassemia minor Hemoglobin stable at the time of 11 g.  #2. Atrial fibrillation   #3. Sleep apnea syndrome   #5. Type 2 diabetes   #7. 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   #8 essential hypertension   CC: Patient Care Team: Marden Noble, MD as PCP - General (Internal Medicine)   Levert Feinstein, MD 11/5/201410:59 AM

## 2013-07-14 ENCOUNTER — Other Ambulatory Visit: Payer: Self-pay | Admitting: Neurological Surgery

## 2013-07-19 ENCOUNTER — Telehealth: Payer: Self-pay | Admitting: Cardiology

## 2013-07-19 ENCOUNTER — Telehealth: Payer: Self-pay | Admitting: General Surgery

## 2013-07-19 NOTE — Telephone Encounter (Signed)
Clearance sent over for pt and pt is aware.

## 2013-07-19 NOTE — Telephone Encounter (Signed)
Spoke with pt and she has not had any SOB or CP. She stated she can not walk to much due to her Back pain. She stated she could walk a block or so before the pain is too bad and some sob would start. She said since she has been on the BiPAP machine shes been doing wonderful. To Dr. Mayford Knife to advise about Sx clearance for L/3-4, L/4-5 Lumbar Fusion on 09/22/13

## 2013-07-19 NOTE — Telephone Encounter (Signed)
Patient had a nuclear stress test 1 and 1/2 years ago which was normal and she has not had any chest pain.  OK to proceed with surgery

## 2013-07-19 NOTE — Telephone Encounter (Signed)
Received request from Nurse fax box, documents faxed for surgical clearance. To: [facility] Fax number: [phone] Attention: 07/19/13/KM

## 2013-07-25 HISTORY — PX: EYE SURGERY: SHX253

## 2013-09-08 ENCOUNTER — Encounter (HOSPITAL_COMMUNITY): Payer: Self-pay | Admitting: Pharmacy Technician

## 2013-09-09 ENCOUNTER — Encounter: Payer: Self-pay | Admitting: Cardiology

## 2013-09-14 ENCOUNTER — Encounter (HOSPITAL_COMMUNITY)
Admission: RE | Admit: 2013-09-14 | Discharge: 2013-09-14 | Disposition: A | Discharge status: Home / Self Care | Payer: Medicare PPO | Source: Ambulatory Visit | Attending: Neurological Surgery | Admitting: Neurological Surgery

## 2013-09-14 ENCOUNTER — Encounter (HOSPITAL_COMMUNITY): Payer: Self-pay

## 2013-09-14 DIAGNOSIS — Z01812 Encounter for preprocedural laboratory examination: Secondary | ICD-10-CM | POA: Insufficient documentation

## 2013-09-14 DIAGNOSIS — Z01818 Encounter for other preprocedural examination: Secondary | ICD-10-CM | POA: Insufficient documentation

## 2013-09-14 DIAGNOSIS — Z01810 Encounter for preprocedural cardiovascular examination: Secondary | ICD-10-CM | POA: Insufficient documentation

## 2013-09-14 DIAGNOSIS — Z01811 Encounter for preprocedural respiratory examination: Secondary | ICD-10-CM | POA: Insufficient documentation

## 2013-09-14 LAB — CBC WITH DIFFERENTIAL/PLATELET
Basophils Absolute: 0 10*3/uL (ref 0.0–0.1)
Basophils Relative: 0 % (ref 0–1)
Eosinophils Absolute: 0.1 10*3/uL (ref 0.0–0.7)
Eosinophils Relative: 2 % (ref 0–5)
HCT: 36.1 % (ref 36.0–46.0)
Hemoglobin: 11.2 g/dL — ABNORMAL LOW (ref 12.0–15.0)
Lymphocytes Relative: 27 % (ref 12–46)
Lymphs Abs: 1.8 10*3/uL (ref 0.7–4.0)
MCH: 19.7 pg — ABNORMAL LOW (ref 26.0–34.0)
MCHC: 31 g/dL (ref 30.0–36.0)
MCV: 63.6 fL — ABNORMAL LOW (ref 78.0–100.0)
Monocytes Absolute: 0.3 10*3/uL (ref 0.1–1.0)
Monocytes Relative: 5 % (ref 3–12)
Neutro Abs: 4.6 10*3/uL (ref 1.7–7.7)
Neutrophils Relative %: 66 % (ref 43–77)
Platelets: 182 10*3/uL (ref 150–400)
RBC: 5.68 MIL/uL — ABNORMAL HIGH (ref 3.87–5.11)
RDW: 17 % — ABNORMAL HIGH (ref 11.5–15.5)
WBC: 6.8 10*3/uL (ref 4.0–10.5)

## 2013-09-14 LAB — BASIC METABOLIC PANEL
BUN: 13 mg/dL (ref 6–23)
CO2: 24 mEq/L (ref 19–32)
Calcium: 9 mg/dL (ref 8.4–10.5)
Chloride: 103 mEq/L (ref 96–112)
Creatinine, Ser: 0.87 mg/dL (ref 0.50–1.10)
GFR calc Af Amer: 78 mL/min — ABNORMAL LOW (ref >90)
GFR calc non Af Amer: 67 mL/min — ABNORMAL LOW (ref >90)
Glucose, Bld: 257 mg/dL — ABNORMAL HIGH (ref 70–99)
Potassium: 4.1 mEq/L (ref 3.7–5.3)
Sodium: 142 mEq/L (ref 137–147)

## 2013-09-14 LAB — TYPE AND SCREEN
ABO/RH(D): A POS
Antibody Screen: NEGATIVE

## 2013-09-14 LAB — SURGICAL PCR SCREEN
MRSA, PCR: NEGATIVE
Staphylococcus aureus: NEGATIVE

## 2013-09-14 NOTE — Pre-Procedure Instructions (Addendum)
ANNAROSE OUELLET  09/14/2013   Your procedure is scheduled on:  09/22/13  Report to [facility] short stay admitting at 530 AM.  Call this number if you have problems the morning of surgery: [phone]   Remember:   Do not eat food or drink liquids after midnight.   Take these medicines the morning of surgery with A SIP OF WATER: diltiazem     STOP all herbel meds, nsaids (aleve,naproxen,advil,ibuprofen) 5 days prior to surgery including aspirin, vitamins        Coumadin per dr   Lazaro Arms not wear jewelry, make-up or nail polish.  Do not wear lotions, powders, or perfumes. You may wear deodorant.  Do not shave 48 hours prior to surgery. Men may shave face and neck.  Do not bring valuables to the hospital.  [facility] is not responsible                  for any belongings or valuables.               Contacts, dentures or bridgework may not be worn into surgery.  Leave suitcase in the car. After surgery it may be brought to your room.  For patients admitted to the hospital, discharge time is determined by your                treatment team.               Patients discharged the day of surgery will not be allowed to drive  home.  Name and phone number of your driver:   Special Instructions: Shower using CHG 2 nights before surgery and the night before surgery.  If you shower the day of surgery use CHG.  Use special wash - you have one bottle of CHG for all showers.  You should use approximately 1/3 of the bottle for each shower.   Please read over the following fact sheets that you were given: Pain Booklet, Coughing and Deep Breathing, Blood Transfusion Information, MRSA Information and Surgical Site Infection Prevention

## 2013-09-15 NOTE — Progress Notes (Signed)
Anesthesia Chart Review:  Patient is a 68 year old female scheduled for L3-4, L4-5 MAS, PLIF on 09/22/13 by Dr. Ronnald Ramp.  History includes morbid obesity, former smoker, DM2, afib (hospitalization 08/2011 for PAF), HTN, IBS, anemia, Thalassemia minor, empty sella syndrome, OSA, GERD, breast cancer s/p left lumpectomy '10. PCP is Dr. Josetta Huddle.  Cardiologist is Dr. Fransico Him who cleared patient from a cardiac standpoint.  She was seen by EP cardiologist Dr. Lovena Le in 2013.    EKG on 09/14/13 showed NSR, cannot rule out anterior infarct (age undetermined), non-specific ST/T wave changes.    Nuclear stress test on 09/2011 showed normal myocardial perfusion, no regional wall motion abnormalities, post-stress EF 69%.   Echo on 04/01/11 showed: Mild concentric LVH, LVEF estimated at 71.3%, trivial TR, anterior MV leaflet is calcified at the tip but opens well.  CXR on 09/14/13 showed no active cardiopulmonary disease.  Preoperative labs noted.  Her Coumadin is being held starting 09/15/13. She will get a PT/PTT on arrival. She will get a fasting CBG on arrival.  If follow-up labs are acceptable and otherwise no acute changes then I anticipate that she can proceed as planned.  [name] Short Stay Center/Anesthesiology Phone [phone] 09/16/2013 9:21 AM

## 2013-09-15 NOTE — Progress Notes (Signed)
Pt called to clarify pre op instructions about medications.  Informed her that it is okay for her to take glimipride up through the day before her surgery and to take only diltiazem on the day of her surgery. Pt repeated back and voiced understanding.

## 2013-09-21 MED ORDER — CEFAZOLIN SODIUM-DEXTROSE 2-3 GM-% IV SOLR
2.0000 g | INTRAVENOUS | Status: AC
  Administered 2013-09-22: 2 g via INTRAVENOUS
  Filled 2013-09-21: qty 50

## 2013-09-22 ENCOUNTER — Encounter (HOSPITAL_COMMUNITY): Payer: Medicare PPO | Admitting: Vascular Surgery

## 2013-09-22 ENCOUNTER — Inpatient Hospital Stay (HOSPITAL_COMMUNITY): Payer: Medicare PPO | Admitting: Anesthesiology

## 2013-09-22 ENCOUNTER — Encounter (HOSPITAL_COMMUNITY)
Admission: RE | Disposition: A | Discharge status: Home Health | Payer: Medicare PPO | Source: Ambulatory Visit | Attending: Neurological Surgery

## 2013-09-22 ENCOUNTER — Encounter (HOSPITAL_COMMUNITY): Payer: Self-pay | Admitting: *Deleted

## 2013-09-22 ENCOUNTER — Inpatient Hospital Stay (HOSPITAL_COMMUNITY)
Admission: RE | Admit: 2013-09-22 | Discharge: 2013-09-24 | DRG: 460 | Disposition: A | Discharge status: Home Health | Payer: Medicare PPO | Source: Ambulatory Visit | Attending: Neurological Surgery | Admitting: Neurological Surgery

## 2013-09-22 ENCOUNTER — Inpatient Hospital Stay (HOSPITAL_COMMUNITY): Payer: Medicare PPO

## 2013-09-22 DIAGNOSIS — E119 Type 2 diabetes mellitus without complications: Secondary | ICD-10-CM | POA: Diagnosis present

## 2013-09-22 DIAGNOSIS — Z881 Allergy status to other antibiotic agents status: Secondary | ICD-10-CM

## 2013-09-22 DIAGNOSIS — Q762 Congenital spondylolisthesis: Principal | ICD-10-CM

## 2013-09-22 DIAGNOSIS — D563 Thalassemia minor: Secondary | ICD-10-CM | POA: Diagnosis present

## 2013-09-22 DIAGNOSIS — G4700 Insomnia, unspecified: Secondary | ICD-10-CM | POA: Diagnosis present

## 2013-09-22 DIAGNOSIS — K219 Gastro-esophageal reflux disease without esophagitis: Secondary | ICD-10-CM | POA: Diagnosis present

## 2013-09-22 DIAGNOSIS — E236 Other disorders of pituitary gland: Secondary | ICD-10-CM | POA: Diagnosis present

## 2013-09-22 DIAGNOSIS — E669 Obesity, unspecified: Secondary | ICD-10-CM | POA: Diagnosis present

## 2013-09-22 DIAGNOSIS — M5137 Other intervertebral disc degeneration, lumbosacral region: Secondary | ICD-10-CM | POA: Diagnosis present

## 2013-09-22 DIAGNOSIS — Z96659 Presence of unspecified artificial knee joint: Secondary | ICD-10-CM

## 2013-09-22 DIAGNOSIS — Z87891 Personal history of nicotine dependence: Secondary | ICD-10-CM

## 2013-09-22 DIAGNOSIS — K589 Irritable bowel syndrome without diarrhea: Secondary | ICD-10-CM | POA: Diagnosis present

## 2013-09-22 DIAGNOSIS — M1990 Unspecified osteoarthritis, unspecified site: Secondary | ICD-10-CM | POA: Diagnosis present

## 2013-09-22 DIAGNOSIS — Z888 Allergy status to other drugs, medicaments and biological substances status: Secondary | ICD-10-CM

## 2013-09-22 DIAGNOSIS — M51379 Other intervertebral disc degeneration, lumbosacral region without mention of lumbar back pain or lower extremity pain: Secondary | ICD-10-CM | POA: Diagnosis present

## 2013-09-22 DIAGNOSIS — I10 Essential (primary) hypertension: Secondary | ICD-10-CM | POA: Diagnosis present

## 2013-09-22 DIAGNOSIS — G4730 Sleep apnea, unspecified: Secondary | ICD-10-CM

## 2013-09-22 DIAGNOSIS — Z9851 Tubal ligation status: Secondary | ICD-10-CM

## 2013-09-22 DIAGNOSIS — Z981 Arthrodesis status: Secondary | ICD-10-CM

## 2013-09-22 DIAGNOSIS — Z853 Personal history of malignant neoplasm of breast: Secondary | ICD-10-CM

## 2013-09-22 DIAGNOSIS — I4891 Unspecified atrial fibrillation: Secondary | ICD-10-CM | POA: Diagnosis present

## 2013-09-22 HISTORY — PX: MAXIMUM ACCESS (MAS)POSTERIOR LUMBAR INTERBODY FUSION (PLIF) 2 LEVEL: SHX6369

## 2013-09-22 LAB — PROTIME-INR
INR: 1.08 (ref 0.00–1.49)
Prothrombin Time: 13.8 seconds (ref 11.6–15.2)

## 2013-09-22 LAB — GLUCOSE, CAPILLARY
Glucose-Capillary: 158 mg/dL — ABNORMAL HIGH (ref 70–99)
Glucose-Capillary: 155 mg/dL — ABNORMAL HIGH (ref 70–99)
Glucose-Capillary: 242 mg/dL — ABNORMAL HIGH (ref 70–99)
Glucose-Capillary: 214 mg/dL — ABNORMAL HIGH (ref 70–99)

## 2013-09-22 LAB — APTT: aPTT: 31 seconds (ref 24–37)

## 2013-09-22 SURGERY — FOR MAXIMUM ACCESS (MAS) POSTERIOR LUMBAR INTERBODY FUSION (PLIF) 2 LEVEL
Anesthesia: General | Site: Back

## 2013-09-22 MED ORDER — METHOCARBAMOL 100 MG/ML IJ SOLN
500.0000 mg | Freq: Four times a day (QID) | INTRAVENOUS | Status: DC | PRN
  Filled 2013-09-22: qty 5

## 2013-09-22 MED ORDER — OXYCODONE-ACETAMINOPHEN 5-325 MG PO TABS
1.0000 | ORAL_TABLET | ORAL | Status: DC | PRN
Start: 2013-09-22 — End: 2013-09-24
  Administered 2013-09-22 – 2013-09-24 (×7): 2 via ORAL
  Filled 2013-09-22 (×7): qty 2

## 2013-09-22 MED ORDER — CEFAZOLIN SODIUM 1-5 GM-% IV SOLN
1.0000 g | Freq: Three times a day (TID) | INTRAVENOUS | Status: AC
  Administered 2013-09-22 (×2): 1 g via INTRAVENOUS
  Filled 2013-09-22 (×2): qty 50

## 2013-09-22 MED ORDER — MEPERIDINE HCL 25 MG/ML IJ SOLN: 6.2500 mg | INTRAMUSCULAR | Status: DC | PRN

## 2013-09-22 MED ORDER — METHOCARBAMOL 500 MG PO TABS
500.0000 mg | ORAL_TABLET | Freq: Four times a day (QID) | ORAL | Status: DC | PRN
  Administered 2013-09-22 – 2013-09-24 (×4): 500 mg via ORAL
  Filled 2013-09-22 (×4): qty 1

## 2013-09-22 MED ORDER — LIDOCAINE HCL (CARDIAC) 20 MG/ML IV SOLN
INTRAVENOUS | Status: DC | PRN
  Administered 2013-09-22: 100 mg via INTRAVENOUS

## 2013-09-22 MED ORDER — PROPOFOL 10 MG/ML IV BOLUS
INTRAVENOUS | Status: AC
  Filled 2013-09-22: qty 20

## 2013-09-22 MED ORDER — PROPOFOL 10 MG/ML IV BOLUS
INTRAVENOUS | Status: DC | PRN
  Administered 2013-09-22: 200 mg via INTRAVENOUS

## 2013-09-22 MED ORDER — ARTIFICIAL TEARS OP OINT
TOPICAL_OINTMENT | OPHTHALMIC | Status: AC
  Filled 2013-09-22: qty 3.5

## 2013-09-22 MED ORDER — OXYCODONE HCL 5 MG/5ML PO SOLN: 5.0000 mg | Freq: Once | ORAL | Status: AC | PRN

## 2013-09-22 MED ORDER — GLYCOPYRROLATE 0.2 MG/ML IJ SOLN
INTRAMUSCULAR | Status: AC
  Filled 2013-09-22: qty 3

## 2013-09-22 MED ORDER — OXYCODONE HCL 5 MG PO TABS
ORAL_TABLET | ORAL | Status: AC
  Filled 2013-09-22: qty 1

## 2013-09-22 MED ORDER — FENTANYL CITRATE 0.05 MG/ML IJ SOLN
INTRAMUSCULAR | Status: AC
  Filled 2013-09-22: qty 5

## 2013-09-22 MED ORDER — CELECOXIB 200 MG PO CAPS
200.0000 mg | ORAL_CAPSULE | Freq: Two times a day (BID) | ORAL | Status: DC
Start: 2013-09-22 — End: 2013-09-24
  Administered 2013-09-22 – 2013-09-24 (×4): 200 mg via ORAL
  Filled 2013-09-22 (×5): qty 1

## 2013-09-22 MED ORDER — MIDAZOLAM HCL 2 MG/2ML IJ SOLN
INTRAMUSCULAR | Status: AC
  Filled 2013-09-22: qty 2

## 2013-09-22 MED ORDER — ACETAMINOPHEN 650 MG RE SUPP: 650.0000 mg | RECTAL | Status: DC | PRN

## 2013-09-22 MED ORDER — FENTANYL CITRATE 0.05 MG/ML IJ SOLN
INTRAMUSCULAR | Status: DC | PRN
  Administered 2013-09-22: 50 ug via INTRAVENOUS
  Administered 2013-09-22: 150 ug via INTRAVENOUS
  Administered 2013-09-22: 50 ug via INTRAVENOUS

## 2013-09-22 MED ORDER — PHENYLEPHRINE HCL 10 MG/ML IJ SOLN
INTRAMUSCULAR | Status: DC | PRN
  Administered 2013-09-22: 40 ug via INTRAVENOUS
  Administered 2013-09-22: 80 ug via INTRAVENOUS
  Administered 2013-09-22 (×2): 40 ug via INTRAVENOUS

## 2013-09-22 MED ORDER — EPHEDRINE SULFATE 50 MG/ML IJ SOLN
INTRAMUSCULAR | Status: AC
  Filled 2013-09-22: qty 1

## 2013-09-22 MED ORDER — THROMBIN 5000 UNITS EX SOLR
OROMUCOSAL | Status: DC | PRN
  Administered 2013-09-22 (×2): via TOPICAL

## 2013-09-22 MED ORDER — DILTIAZEM HCL 90 MG PO TABS
90.0000 mg | ORAL_TABLET | Freq: Two times a day (BID) | ORAL | Status: DC
  Administered 2013-09-22 – 2013-09-24 (×4): 90 mg via ORAL
  Filled 2013-09-22 (×5): qty 1

## 2013-09-22 MED ORDER — ONDANSETRON HCL 4 MG/2ML IJ SOLN: 4.0000 mg | Freq: Once | INTRAMUSCULAR | Status: DC | PRN

## 2013-09-22 MED ORDER — ARTIFICIAL TEARS OP OINT
TOPICAL_OINTMENT | OPHTHALMIC | Status: DC | PRN
  Administered 2013-09-22: 1 via OPHTHALMIC

## 2013-09-22 MED ORDER — ACETAMINOPHEN 10 MG/ML IV SOLN
INTRAVENOUS | Status: AC
  Filled 2013-09-22: qty 100

## 2013-09-22 MED ORDER — LIDOCAINE HCL (CARDIAC) 20 MG/ML IV SOLN
INTRAVENOUS | Status: AC
  Filled 2013-09-22: qty 5

## 2013-09-22 MED ORDER — MORPHINE SULFATE 2 MG/ML IJ SOLN
1.0000 mg | INTRAMUSCULAR | Status: DC | PRN
  Administered 2013-09-22 (×2): 2 mg via INTRAVENOUS
  Filled 2013-09-22 (×2): qty 1

## 2013-09-22 MED ORDER — ROCURONIUM BROMIDE 50 MG/5ML IV SOLN
INTRAVENOUS | Status: AC
  Filled 2013-09-22: qty 1

## 2013-09-22 MED ORDER — SUCCINYLCHOLINE CHLORIDE 20 MG/ML IJ SOLN
INTRAMUSCULAR | Status: AC
  Filled 2013-09-22: qty 1

## 2013-09-22 MED ORDER — HYDROMORPHONE HCL PF 1 MG/ML IJ SOLN
0.2500 mg | INTRAMUSCULAR | Status: DC | PRN
  Administered 2013-09-22 (×2): 0.5 mg via INTRAVENOUS

## 2013-09-22 MED ORDER — SODIUM CHLORIDE 0.9 % IJ SOLN: 3.0000 mL | INTRAMUSCULAR | Status: DC | PRN

## 2013-09-22 MED ORDER — ACETAMINOPHEN 10 MG/ML IV SOLN
1000.0000 mg | Freq: Once | INTRAVENOUS | Status: AC
  Administered 2013-09-22: 1000 mg via INTRAVENOUS

## 2013-09-22 MED ORDER — DEXAMETHASONE SODIUM PHOSPHATE 10 MG/ML IJ SOLN
INTRAMUSCULAR | Status: AC
  Filled 2013-09-22: qty 1

## 2013-09-22 MED ORDER — ONDANSETRON HCL 4 MG/2ML IJ SOLN
4.0000 mg | INTRAMUSCULAR | Status: DC | PRN
  Administered 2013-09-22: 4 mg via INTRAVENOUS
  Filled 2013-09-22: qty 2

## 2013-09-22 MED ORDER — SODIUM CHLORIDE 0.9 % IR SOLN
Status: DC | PRN
  Administered 2013-09-22: 09:00:00

## 2013-09-22 MED ORDER — PROPOFOL INFUSION 10 MG/ML OPTIME
INTRAVENOUS | Status: DC | PRN
  Administered 2013-09-22: 75 ug/kg/min via INTRAVENOUS

## 2013-09-22 MED ORDER — SODIUM CHLORIDE 0.9 % IV SOLN: 250.0000 mL | INTRAVENOUS | Status: DC

## 2013-09-22 MED ORDER — POTASSIUM CHLORIDE IN NACL 20-0.9 MEQ/L-% IV SOLN
INTRAVENOUS | Status: DC
  Administered 2013-09-22: 21:00:00 via INTRAVENOUS
  Filled 2013-09-22 (×5): qty 1000

## 2013-09-22 MED ORDER — SODIUM CHLORIDE 0.9 % IJ SOLN
INTRAMUSCULAR | Status: AC
  Filled 2013-09-22: qty 10

## 2013-09-22 MED ORDER — ONDANSETRON HCL 4 MG/2ML IJ SOLN
INTRAMUSCULAR | Status: AC
  Filled 2013-09-22: qty 2

## 2013-09-22 MED ORDER — OXYCODONE HCL 5 MG PO TABS
5.0000 mg | ORAL_TABLET | Freq: Once | ORAL | Status: AC | PRN
  Administered 2013-09-22: 5 mg via ORAL

## 2013-09-22 MED ORDER — HYDROMORPHONE HCL PF 1 MG/ML IJ SOLN
INTRAMUSCULAR | Status: AC
  Filled 2013-09-22: qty 1

## 2013-09-22 MED ORDER — ALBUMIN HUMAN 5 % IV SOLN
INTRAVENOUS | Status: DC | PRN
  Administered 2013-09-22: 09:00:00 via INTRAVENOUS

## 2013-09-22 MED ORDER — PHENYLEPHRINE 40 MCG/ML (10ML) SYRINGE FOR IV PUSH (FOR BLOOD PRESSURE SUPPORT)
PREFILLED_SYRINGE | INTRAVENOUS | Status: AC
  Filled 2013-09-22: qty 10

## 2013-09-22 MED ORDER — SODIUM CHLORIDE 0.9 % IJ SOLN
3.0000 mL | Freq: Two times a day (BID) | INTRAMUSCULAR | Status: DC
  Administered 2013-09-23 (×2): 3 mL via INTRAVENOUS

## 2013-09-22 MED ORDER — MENTHOL 3 MG MT LOZG: 1.0000 | LOZENGE | OROMUCOSAL | Status: DC | PRN

## 2013-09-22 MED ORDER — ACETAMINOPHEN 325 MG PO TABS: 650.0000 mg | ORAL_TABLET | ORAL | Status: DC | PRN

## 2013-09-22 MED ORDER — MIDAZOLAM HCL 5 MG/5ML IJ SOLN
INTRAMUSCULAR | Status: DC | PRN
  Administered 2013-09-22: 2 mg via INTRAVENOUS

## 2013-09-22 MED ORDER — PHENOL 1.4 % MT LIQD: 1.0000 | OROMUCOSAL | Status: DC | PRN

## 2013-09-22 MED ORDER — ONDANSETRON HCL 4 MG/2ML IJ SOLN
INTRAMUSCULAR | Status: DC | PRN
Start: 2013-09-22 — End: 2013-09-22
  Administered 2013-09-22: 4 mg via INTRAVENOUS

## 2013-09-22 MED ORDER — 0.9 % SODIUM CHLORIDE (POUR BTL) OPTIME
TOPICAL | Status: DC | PRN
  Administered 2013-09-22: 1000 mL

## 2013-09-22 MED ORDER — LACTATED RINGERS IV SOLN
INTRAVENOUS | Status: DC | PRN
  Administered 2013-09-22 (×3): via INTRAVENOUS

## 2013-09-22 MED ORDER — GLIMEPIRIDE 4 MG PO TABS
4.0000 mg | ORAL_TABLET | Freq: Every day | ORAL | Status: DC
  Administered 2013-09-23 – 2013-09-24 (×2): 4 mg via ORAL
  Filled 2013-09-22 (×3): qty 1

## 2013-09-22 MED ORDER — THROMBIN 20000 UNITS EX SOLR
CUTANEOUS | Status: DC | PRN
  Administered 2013-09-22: 09:00:00 via TOPICAL

## 2013-09-22 MED ORDER — NEOSTIGMINE METHYLSULFATE 1 MG/ML IJ SOLN
INTRAMUSCULAR | Status: AC
  Filled 2013-09-22: qty 10

## 2013-09-22 MED ORDER — DEXAMETHASONE SODIUM PHOSPHATE 10 MG/ML IJ SOLN
10.0000 mg | INTRAMUSCULAR | Status: AC
Start: 2013-09-22 — End: 2013-09-22
  Administered 2013-09-22: 10 mg via INTRAVENOUS

## 2013-09-22 SURGICAL SUPPLY — 66 items
BAG DECANTER FOR FLEXI CONT (MISCELLANEOUS) ×2 IMPLANT
BENZOIN TINCTURE PRP APPL 2/3 (GAUZE/BANDAGES/DRESSINGS) ×2 IMPLANT
BLADE SURG ROTATE 9660 (MISCELLANEOUS) IMPLANT
BONE MATRIX OSTEOCEL PRO MED (Bone Implant) ×2 IMPLANT
BUR MATCHSTICK NEURO 3.0 LAGG (BURR) ×2 IMPLANT
CAGE COROENT LG 12X9X23-12 (Cage) ×4 IMPLANT
CAGE COROENT MP 8X9X23M-8 SPIN (Cage) ×4 IMPLANT
CANISTER SUCT 3000ML (MISCELLANEOUS) ×2 IMPLANT
CLIP NEUROVISION LG (CLIP) ×4 IMPLANT
CONT SPEC 4OZ CLIKSEAL STRL BL (MISCELLANEOUS) ×4 IMPLANT
COVER BACK TABLE 24X17X13 BIG (DRAPES) IMPLANT
COVER TABLE BACK 60X90 (DRAPES) ×2 IMPLANT
DRAPE C-ARM 42X72 X-RAY (DRAPES) ×2 IMPLANT
DRAPE C-ARMOR (DRAPES) ×2 IMPLANT
DRAPE LAPAROTOMY 100X72X124 (DRAPES) ×2 IMPLANT
DRAPE POUCH INSTRU U-SHP 10X18 (DRAPES) ×2 IMPLANT
DRAPE SURG 17X23 STRL (DRAPES) ×2 IMPLANT
DRESSING TELFA 8X3 (GAUZE/BANDAGES/DRESSINGS) ×2 IMPLANT
DRSG OPSITE 4X5.5 SM (GAUZE/BANDAGES/DRESSINGS) ×2 IMPLANT
DRSG OPSITE POSTOP 4X6 (GAUZE/BANDAGES/DRESSINGS) ×2 IMPLANT
DURAPREP 26ML APPLICATOR (WOUND CARE) ×2 IMPLANT
ELECT REM PT RETURN 9FT ADLT (ELECTROSURGICAL) ×2
ELECTRODE REM PT RTRN 9FT ADLT (ELECTROSURGICAL) ×1 IMPLANT
EVACUATOR 1/8 PVC DRAIN (DRAIN) ×2 IMPLANT
GAUZE SPONGE 4X4 16PLY XRAY LF (GAUZE/BANDAGES/DRESSINGS) IMPLANT
GLOVE BIO SURGEON STRL SZ8 (GLOVE) ×4 IMPLANT
GLOVE BIOGEL PI IND STRL 7.0 (GLOVE) ×2 IMPLANT
GLOVE BIOGEL PI INDICATOR 7.0 (GLOVE) ×2
GLOVE ECLIPSE 8.0 STRL XLNG CF (GLOVE) ×2 IMPLANT
GLOVE SS N UNI LF 7.0 STRL (GLOVE) ×8 IMPLANT
GOWN BRE IMP SLV AUR LG STRL (GOWN DISPOSABLE) IMPLANT
GOWN BRE IMP SLV AUR XL STRL (GOWN DISPOSABLE) ×2 IMPLANT
GOWN STRL REIN 2XL LVL4 (GOWN DISPOSABLE) IMPLANT
GOWN STRL REUS W/ TWL LRG LVL3 (GOWN DISPOSABLE) ×3 IMPLANT
GOWN STRL REUS W/ TWL XL LVL3 (GOWN DISPOSABLE) ×1 IMPLANT
GOWN STRL REUS W/TWL LRG LVL3 (GOWN DISPOSABLE) ×3
GOWN STRL REUS W/TWL XL LVL3 (GOWN DISPOSABLE) ×1
HEMOSTAT POWDER KIT SURGIFOAM (HEMOSTASIS) IMPLANT
KIT BASIN OR (CUSTOM PROCEDURE TRAY) ×2 IMPLANT
KIT ROOM TURNOVER OR (KITS) ×2 IMPLANT
MILL MEDIUM DISP (BLADE) ×2 IMPLANT
NEEDLE HYPO 25X1 1.5 SAFETY (NEEDLE) ×2 IMPLANT
NS IRRIG 1000ML POUR BTL (IV SOLUTION) ×2 IMPLANT
PACK LAMINECTOMY NEURO (CUSTOM PROCEDURE TRAY) ×2 IMPLANT
PAD ARMBOARD 7.5X6 YLW CONV (MISCELLANEOUS) ×6 IMPLANT
ROD 55MM (Rod) ×2 IMPLANT
ROD SPNL 55XPREBNT NS MAS (Rod) ×2 IMPLANT
SCREW LOCK (Screw) ×6 IMPLANT
SCREW LOCK FXNS SPNE MAS PL (Screw) ×6 IMPLANT
SCREW MAS PLIF 5.5X30 (Screw) ×4 IMPLANT
SCREW PAS PLIF 5X30 (Screw) ×4 IMPLANT
SCREW SHANK 5.0X30MM (Screw) ×4 IMPLANT
SCREW TULIP 5.5 (Screw) ×8 IMPLANT
SPONGE LAP 4X18 X RAY DECT (DISPOSABLE) ×2 IMPLANT
SPONGE SURGIFOAM ABS GEL 100 (HEMOSTASIS) ×2 IMPLANT
STRIP CLOSURE SKIN 1/2X4 (GAUZE/BANDAGES/DRESSINGS) ×2 IMPLANT
SUT VIC AB 0 CT1 18XCR BRD8 (SUTURE) ×1 IMPLANT
SUT VIC AB 0 CT1 8-18 (SUTURE) ×1
SUT VIC AB 2-0 CP2 18 (SUTURE) ×2 IMPLANT
SUT VIC AB 3-0 SH 8-18 (SUTURE) ×4 IMPLANT
SYR 20ML ECCENTRIC (SYRINGE) ×2 IMPLANT
SYR 3ML LL SCALE MARK (SYRINGE) IMPLANT
TOWEL OR 17X24 6PK STRL BLUE (TOWEL DISPOSABLE) ×2 IMPLANT
TOWEL OR 17X26 10 PK STRL BLUE (TOWEL DISPOSABLE) ×2 IMPLANT
TRAY FOLEY CATH 14FRSI W/METER (CATHETERS) ×2 IMPLANT
WATER STERILE IRR 1000ML POUR (IV SOLUTION) ×2 IMPLANT

## 2013-09-22 NOTE — H&P (Signed)
Subjective: Patient is a 68 y.o. 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 Onset of symptoms was several months ago, gradually worsening since that time.  The pain is rated severe, and is located at the across the lower back and radiates to legs. The pain is described as aching and occurs intermittently. The symptoms have been progressive. Symptoms are exacerbated by exercise. MRI or CT showed spondylolisthesis with stenosis L3-4 and L4-5.   Past Medical History  Diagnosis Date  . Diabetes mellitus   . A-fib   . Obesity   . Hypertension   . Irritable bowel   . Osteoarthritis   . Degenerative disc disease, lumbar   . PONV (postoperative nausea and vomiting)   . Pneumonia   . Anemia   . Insomnia   . Thalassemia minor   . Empty sella syndrome   . Sleep apnea     bipap  . Gastroesophageal reflux disease     denies  . Breast CA     left    Past Surgical History  Procedure Laterality Date  . Replacement total knee Left 11  . Tonsillectomy    . Dilation and curettage of uterus    . Replacement total knee    . Tubal ligation    . Cystectomy  70    pilonidal   . Oophorectomy      wedge section not rem  . Breast surgery Left 10    lumpectomy  . Eye surgery Bilateral 12/14    cataracts  . Cesarean section      Prior to Admission medications   Medication Sig Start Date End Date Taking? Authorizing Provider  calcitRIOL (ROCALTROL) 0.25 MCG capsule Take 0.25 mcg by mouth daily.   Yes Historical Provider, MD  Calcium Carbonate-Vitamin D (CALCIUM 600 + D PO) Take 1 tablet by mouth 2 (two) times daily.   Yes Historical Provider, MD  diltiazem (CARDIZEM) 90 MG tablet Take 90 mg by mouth every 12 (twelve) hours. 09/14/11  Yes Jacolyn Reedy, MD  glimepiride (AMARYL) 4 MG tablet Take 4 mg by mouth daily with breakfast.   Yes Historical Provider, MD  magnesium oxide (MAG-OX) 400 MG tablet Take 400 mg by mouth daily.   Yes Historical Provider, MD  Multiple  Vitamin (MULITIVITAMIN WITH MINERALS) TABS Take 1 tablet by mouth daily.   Yes Historical Provider, MD  warfarin (COUMADIN) 5 MG tablet Take 5-7.5 mg by mouth every evening. Monday - Saturday 5 mg, Sundays 7.5 mg   Yes Historical Provider, MD  Difluprednate (DUREZOL OP) Apply to eye. 1 drop bid rt eye finished 1.22.15    Historical Provider, MD   Allergies  Allergen Reactions  . Letrozole Other (See Comments)    Unknown reactions   . Metoprolol Other (See Comments)    Fatigue   . Tamoxifen     Bone and body aches   . Tetracyclines & Related Other (See Comments)    Vaginal infections    . Vicodin [Hydrocodone-Acetaminophen] Nausea And Vomiting    Patient reports terrible nausea; plain tylenol is ok with patient    History  Substance Use Topics  . Smoking status: Former Smoker -- 0.50 packs/day for 10 years    Types: Cigarettes    Quit date: 11/12/2003  . Smokeless tobacco: Not on file  . Alcohol Use: No    Family History  Problem Relation Age of Onset  . Anesthesia problems Mother      Review of Systems  Positive ROS: neg  All other systems have been reviewed and were otherwise negative with the exception of those mentioned in the HPI and as above.  Objective: Vital signs in last 24 hours: Temp:  [97.9 F (36.6 C)] 97.9 F (36.6 C) (01/29 3790) Pulse Rate:  [58-87] 87 (01/29 0633) Resp:  [20] 20 (01/29 2409) BP: (138)/(58) 138/58 mmHg (01/29 0632) SpO2:  [98 %] 98 % (01/29 7353)  General Appearance: Alert, cooperative, no distress, appears stated age Head: Normocephalic, without obvious abnormality, atraumatic Eyes: PERRL, conjunctiva/corneas clear, EOM's intact    Neck: Supple, symmetrical, trachea midline Back: Symmetric, no curvature, ROM normal, no CVA tenderness Lungs:  respirations unlabored Heart: Regular rate and rhythm Abdomen: Soft, non-tender Extremities: Extremities normal, atraumatic, no cyanosis or edema Pulses: 2+ and symmetric all  extremities Skin: Skin color, texture, turgor normal, no rashes or lesions  NEUROLOGIC:   Mental status: Alert and oriented x4,  no aphasia, good attention span, fund of knowledge, and memory Motor Exam - grossly normal Sensory Exam - grossly normal Reflexes: 1+ Coordination - grossly normal Gait - grossly normal Balance - grossly normal Cranial Nerves: I: smell Not tested  II: visual acuity  OS: nl    OD: nl  II: visual fields Full to confrontation  II: pupils Equal, round, reactive to light  III,VII: ptosis None  III,IV,VI: extraocular muscles  Full ROM  V: mastication Normal  V: facial light touch sensation  Normal  V,VII: corneal reflex  Present  VII: facial muscle function - upper  Normal  VII: facial muscle function - lower Normal  VIII: hearing Not tested  IX: soft palate elevation  Normal  IX,X: gag reflex Present  XI: trapezius strength  5/5  XI: sternocleidomastoid strength 5/5  XI: neck flexion strength  5/5  XII: tongue strength  Normal    Data Review Lab Results  Component Value Date   WBC 6.8 09/14/2013   HGB 11.2* 09/14/2013   HCT 36.1 09/14/2013   MCV 63.6* 09/14/2013   PLT 182 09/14/2013   Lab Results  Component Value Date   NA 142 09/14/2013   K 4.1 09/14/2013   CL 103 09/14/2013   CO2 24 09/14/2013   BUN 13 09/14/2013   CREATININE 0.87 09/14/2013   GLUCOSE 257* 09/14/2013   Lab Results  Component Value Date   INR 1.08 09/22/2013    Assessment/Plan: Patient admitted for PLIF L3-4 L4-5. Patient has failed a reasonable attempt at conservative therapy.  I explained the condition and procedure to the patient and answered any questions.  Patient wishes to proceed with procedure as planned. Understands risks/ benefits and typical outcomes of procedure.   Karington Zarazua S 09/22/2013 8:06 AM

## 2013-09-22 NOTE — Preoperative (Signed)
Beta Blockers   Reason not to administer Beta Blockers:Not Applicable 

## 2013-09-22 NOTE — Op Note (Signed)
09/22/2013  11:59 AM  PATIENT:  Jenna Mcdonald  68 y.o. 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 Decompressive lumbar laminectomy L3-4 and L4-5 requiring more work than would be required for a simple exposure of the disk for PLIF in order to adequately decompress the neural elements and address the spinal stenosis 2. Posterior lumbar interbody fusion L3-4 and L4-5 using PEEK interbody cages packed with morcellized allograft and autograft 3. Posterior fixation L3-L5 inclusive using cortical pedicle screws.    SURGEON:  Sherley Bounds, MD  ASSISTANTS: Dr. Hal Neer  ANESTHESIA:  General  EBL: 300 ml  Total I/O In: 2750 [I.V.:2500; IV Piggyback:250] Out: 630 [Urine:235; Blood:300]  BLOOD ADMINISTERED:none  DRAINS: Hemovac   INDICATION FOR PROCEDURE: This patient presented with a long history of severe back pain with bilateral leg pain. She had an MRI which showed spinal stenosis at L3-4 L4-5 with a grade 1 spondylolisthesis L4-5. Dynamic plain films showed significant instability at L4-5. She tried medical management without relief. Recommended a 2 level decompression and instrumented fusion. Patient understood the risks, benefits, and alternatives and potential outcomes and wished to proceed.  PROCEDURE DETAILS:  The patient was brought to the operating room. After induction of generalized endotracheal anesthesia the patient was rolled into the prone position on chest rolls and all pressure points were padded. The patient's lumbar region was cleaned and then prepped with DuraPrep and draped in the usual sterile fashion. Anesthesia was injected and then a dorsal midline incision was made and carried down to the lumbosacral fascia. The fascia was opened and the paraspinous musculature was taken down in a subperiosteal fashion to expose L3-4 and L4-5. A  self-retaining retractor was placed. Intraoperative fluoroscopy confirmed my level, and I started with placement of the L3 cortical pedicle screws. The pedicle screw entry zones were identified utilizing surface landmarks and  AP and lateral fluoroscopy. I scored the cortex with the high-speed drill and then used the hand drill and EMG monitoring to drill an upward and outward direction into the pedicle. I then tapped line to line, and the tap was also monitored. I then placed a 5-0 x 30 mm cortical pedicle screw into the pedicles of all 3 bilaterally. I then turned my attention to the decompression and the spinous process was removed and complete lumbar laminectomies, hemi- facetectomies, and foraminotomies were performed at L3-4 and L4-5. The patient had significant spinal stenosis and this required more work than would be required for a simple exposure of the disc for posterior lumbar interbody fusion. Much more generous decompression was undertaken in order to adequately decompress the neural elements and address the patient's leg pain. The yellow ligament was removed to expose the underlying dura and nerve roots, and generous foraminotomies were performed to adequately decompress the neural elements. Both the exiting and traversing nerve roots were decompressed on both sides until a coronary dilator passed easily along the nerve roots. Once the decompression was complete, I turned my attention to the posterior lower lumbar interbody fusion. The epidural venous vasculature was coagulated and cut sharply. Disc space was incised and the initial discectomy was performed with pituitary rongeurs. The disc space was distracted with sequential distractors to a height of 12 mm L4-5 and 10 mm at L3-4. We then used a series of scrapers and shavers to prepare the endplates for fusion. The midline was prepared with Epstein curettes.  Once the complete discectomy was finished, we packed an appropriate sized peek interbody  cage with local autograft and morcellized allograft, gently retracted the nerve root, and tapped the cage into position at L3-4 and L4-5.  The midline between the cages was packed with morselized autograft and allograft. We then turned our attention to the placement of the lower pedicle screws. The pedicle screw entry zones were identified utilizing surface landmarks and fluoroscopy. I drilled into each pedicle utilizing the hand drill and EMG monitoring, and tapped each pedicle with the appropriate tap. We palpated with a ball probe to assure no break in the cortex. We then placed 5-0 x 30 mm pedicle screws into the pedicles bilaterally at L4 and L5. . We then placed lordotic rods into the multiaxial screw heads of the pedicle screws and locked these in position with the locking caps and anti-torque device. We then checked our construct with AP and lateral fluoroscopy. Irrigated with copious amounts of bacitracin-containing saline solution. Placed a medium Hemovac drain through separate stab incision. Inspected the nerve roots once again to assure adequate decompression, lined to the dura with Gelfoam, and closed the muscle and the fascia with 0 Vicryl. Closed the subcutaneous tissues with 2-0 Vicryl and subcuticular tissues with 3-0 Vicryl. The skin was closed with benzoin and Steri-Strips. Dressing was then applied, the patient was awakened from general anesthesia and transported to the recovery room in stable condition. At the end of the procedure all sponge, needle and instrument counts were correct.   PLAN OF CARE: Admit to inpatient   PATIENT DISPOSITION:  PACU - hemodynamically stable.   Delay start of Pharmacological VTE agent (>24hrs) due to surgical blood loss or risk of bleeding:  yes

## 2013-09-22 NOTE — Transfer of Care (Signed)
Immediate Anesthesia Transfer of Care Note  Patient: Jenna Mcdonald  Procedure(s) Performed: Procedure(s): FOR MAXIMUM ACCESS  POSTERIOR LUMBAR INTERBODY FUSION LUMBAR THREE-FOUR FOUR-FIVE (N/A)  Patient Location: PACU  Anesthesia Type:General  Level of Consciousness: awake, lethargic and responds to stimulation  Airway & Oxygen Therapy: Patient Spontanous Breathing and Patient connected to nasal cannula oxygen  Post-op Assessment: Report given to PACU RN, Post -op Vital signs reviewed and stable and Patient moving all extremities X 4  Post vital signs: Reviewed and stable  Complications: No apparent anesthesia complications

## 2013-09-22 NOTE — Anesthesia Postprocedure Evaluation (Signed)
Anesthesia Post Note  Patient: Jenna Mcdonald  Procedure(s) Performed: Procedure(s) (LRB): FOR MAXIMUM ACCESS  POSTERIOR LUMBAR INTERBODY FUSION LUMBAR THREE-FOUR FOUR-FIVE (N/A)  Anesthesia type: general  Patient location: PACU  Post pain: Pain level controlled  Post assessment: Patient's Cardiovascular Status Stable  Last Vitals:  Filed Vitals:   09/22/13 1335  BP: 133/85  Pulse: 90  Temp: 37.1 C  Resp: 20    Post vital signs: Reviewed and stable  Level of consciousness: sedated  Complications: No apparent anesthesia complications

## 2013-09-22 NOTE — Anesthesia Procedure Notes (Signed)
Procedure Name: Intubation Date/Time: 09/22/2013 8:20 AM Performed by: Ned Grace Pre-anesthesia Checklist: Patient identified, Patient being monitored, Emergency Drugs available, Timeout performed and Suction available Patient Re-evaluated:Patient Re-evaluated prior to inductionOxygen Delivery Method: Circle system utilized Preoxygenation: Pre-oxygenation with 100% oxygen Intubation Type: IV induction Ventilation: Mask ventilation without difficulty Laryngoscope Size: Mac and 3 Grade View: Grade III Tube type: Oral Tube size: 7.5 mm Number of attempts: 1 Airway Equipment and Method: Stylet Placement Confirmation: ETT inserted through vocal cords under direct vision,  breath sounds checked- equal and bilateral and positive ETCO2 Secured at: 23 cm Tube secured with: Tape Dental Injury: Teeth and Oropharynx as per pre-operative assessment  Comments: Smooth IV induction by Dr Conrad Woodland; Gr III view with Mac 3 and BURP maneuver by JSmith CRNA, ATOI, BS=B, +etCO2.

## 2013-09-22 NOTE — Anesthesia Preprocedure Evaluation (Addendum)
Anesthesia Evaluation  Patient identified by MRN, date of birth, ID band Patient awake    Reviewed: Allergy & Precautions, H&P , NPO status , Patient's Chart, lab work & pertinent test results  History of Anesthesia Complications (+) PONV  Airway Mallampati: II TM Distance: >3 FB Neck ROM: Full    Dental  (+) Teeth Intact, Dental Advisory Given and Caps   Pulmonary sleep apnea , former smoker,  breath sounds clear to auscultation  Pulmonary exam normal       Cardiovascular hypertension, Pt. on medications Rhythm:Regular     Neuro/Psych    GI/Hepatic GERD-  Medicated and Controlled,  Endo/Other    Renal/GU      Musculoskeletal   Abdominal   Peds  Hematology   Anesthesia Other Findings   Reproductive/Obstetrics                          Anesthesia Physical Anesthesia Plan  ASA: II  Anesthesia Plan: General   Post-op Pain Management:    Induction: Intravenous  Airway Management Planned: Oral ETT  Additional Equipment:   Intra-op Plan:   Post-operative Plan: Extubation in OR  Informed Consent: I have reviewed the patients History and Physical, chart, labs and discussed the procedure including the risks, benefits and alternatives for the proposed anesthesia with the patient or authorized representative who has indicated his/her understanding and acceptance.     Plan Discussed with: CRNA and Surgeon  Anesthesia Plan Comments:         Anesthesia Quick Evaluation

## 2013-09-23 LAB — GLUCOSE, CAPILLARY
Glucose-Capillary: 190 mg/dL — ABNORMAL HIGH (ref 70–99)
Glucose-Capillary: 169 mg/dL — ABNORMAL HIGH (ref 70–99)
Glucose-Capillary: 145 mg/dL — ABNORMAL HIGH (ref 70–99)
Glucose-Capillary: 222 mg/dL — ABNORMAL HIGH (ref 70–99)

## 2013-09-23 MED FILL — Sodium Chloride IV Soln 0.9%: INTRAVENOUS | Qty: 1000 | Status: AC

## 2013-09-23 MED FILL — Heparin Sodium (Porcine) Inj 1000 Unit/ML: INTRAMUSCULAR | Qty: 30 | Status: AC

## 2013-09-23 NOTE — Progress Notes (Addendum)
Inpatient Diabetes Program Recommendations  AACE/ADA: New Consensus Statement on Inpatient Glycemic Control (2013)  Target Ranges:  Prepandial:   less than 140 mg/dL      Peak postprandial:   less than 180 mg/dL (1-2 hours)      Critically ill patients:  140 - 180 mg/dL  Diabetes history: DM2 Outpatient Diabetes medications: amaryl 4 mg  Current orders for Inpatient glycemic control: oral agent, Amaryl 4 mg.  Pt received Decadron before surgery. Pt has hyperglycemia. Pt would benefit from addition of correction insulin (mod) tidwc if she stays today in the hospital Would not use oral agent, Amaryl 4 mg. Thank you, Rosita Kea, RN, CNS, Diabetes Coordinator [phone])

## 2013-09-23 NOTE — Progress Notes (Signed)
Utilization review completed.  

## 2013-09-23 NOTE — Progress Notes (Signed)
Patient home CPAP was set up and patient wearing CPAP at this time.

## 2013-09-23 NOTE — Progress Notes (Signed)
Patient ID: Jenna Mcdonald, female   DOB: Jul 28, 1946, 68 y.o.   MRN: [id number] Subjective: Patient reports she is doing well, Did well with OT, did stairs. Back sore as expected, no leg pain or N/T/W.  Objective: Vital signs in last 24 hours: Temp:  [98.1 F (36.7 C)-99 F (37.2 C)] 98.1 F (36.7 C) (01/30 1225) Pulse Rate:  [86-102] 86 (01/30 1225) Resp:  [16-24] 18 (01/30 1225) BP: (112-152)/(51-85) 121/63 mmHg (01/30 1225) SpO2:  [90 %-96 %] 92 % (01/30 1225)  Intake/Output from previous day: 01/29 0701 - 01/30 0700 In: 2750 [I.V.:2500; IV Piggyback:250] Out: 4132 [Urine:2635; Emesis/NG output:700; Drains:360; Blood:300] Intake/Output this shift: Total I/O In: 360 [P.O.:360] Out: 390 [Urine:300; Drains:90]  Neuro exam normal  Lab Results: Lab Results  Component Value Date   WBC 6.8 09/14/2013   HGB 11.2* 09/14/2013   HCT 36.1 09/14/2013   MCV 63.6* 09/14/2013   PLT 182 09/14/2013   Lab Results  Component Value Date   INR 1.08 09/22/2013   BMET Lab Results  Component Value Date   NA 142 09/14/2013   K 4.1 09/14/2013   CL 103 09/14/2013   CO2 24 09/14/2013   GLUCOSE 257* 09/14/2013   BUN 13 09/14/2013   CREATININE 0.87 09/14/2013   CALCIUM 9.0 09/14/2013    Studies/Results: Dg Lumbar Spine 2-3 Views  09/22/2013   CLINICAL DATA:  L3-5 PLIF.  EXAM: LUMBAR SPINE - 2-3 VIEW; DG C-ARM 61-120 MIN  COMPARISON:  MRI lumbar spine 01/30/2013.  FINDINGS: We are provided with 2 fluoroscopic intraoperative spot views of the lower lumbar spine. Images demonstrate pedicle screws and stabilization bars and interbody spacers in place from L3-L5. Hardware is intact. No acute abnormality is identified.  IMPRESSION: L3-5 PLIF.   Electronically Signed   By: Inge Rise M.D.   On: 09/22/2013 15:15   Dg C-arm 61-120 Min  09/22/2013   CLINICAL DATA:  L3-5 PLIF.  EXAM: LUMBAR SPINE - 2-3 VIEW; DG C-ARM 61-120 MIN  COMPARISON:  MRI lumbar spine 01/30/2013.  FINDINGS: We are provided with 2  fluoroscopic intraoperative spot views of the lower lumbar spine. Images demonstrate pedicle screws and stabilization bars and interbody spacers in place from L3-L5. Hardware is intact. No acute abnormality is identified.  IMPRESSION: L3-5 PLIF.   Electronically Signed   By: Inge Rise M.D.   On: 09/22/2013 15:15    Assessment/Plan: Doing well. Maybe home tomorrow   LOS: 1 day    Dinari Stgermaine S 09/23/2013, 12:39 PM

## 2013-09-23 NOTE — Evaluation (Signed)
Physical Therapy Evaluation Patient Details Name: Jenna Mcdonald MRN: [id number] DOB: Mar 09, 1946 Today's Date: 09/23/2013 Time: 4008-6761 PT Time Calculation (min): 42 min  PT Assessment / Plan / Recommendation History of Present Illness  68 y.o. female admitted to [facility] on 09/22/13 for elective L4/5 fusion.    Clinical Impression  Pt is POD #1 s/p lumbar spine surgery. She is very anxious about doing everything "right" and following her back precautions/having the right equipment at home.  She moves well, but would benefit from some reinforcement of her precautions at home in her own environment, so I am recommending both a PT and OT home safety evaluation.  She has used [facility] agency in the past and would like to use them again.   PT to follow acutely for deficits listed below.       PT Assessment  Patient needs continued PT services    Follow Up Recommendations  Home health PT;Supervision - Intermittent (HHPT and HHOT safet eval to help reinforce education)    Does the patient have the potential to tolerate intense rehabilitation     NA  Barriers to Discharge Decreased caregiver support decreased support during the day while her son works.    Equipment Recommendations  Other (comment) (son to confirm if pt has RW, if not she will need RW for hom)    Recommendations for Other Services   None  Frequency Min 5X/week    Precautions / Restrictions Precautions Precautions: Back Precaution Booklet Issued: Yes (comment) Precaution Comments: handout given and reviewed in detail with functional examples of back precautions, lifting restrictions and exercises progression/precautions.  Required Braces or Orthoses: Spinal Brace Spinal Brace: Lumbar corset;Applied in sitting position   Pertinent Vitals/Pain See vitals flow sheet.      Mobility  Bed Mobility General bed mobility comments: discussed log roll technique for getting into and out of bed.  Transfers Overall transfer  level: Needs assistance Equipment used: Rolling walker (2 wheeled) Transfers: Sit to/from Stand Sit to Stand: Min guard General transfer comment: VC for hand placement and to come straight up over her feet instead of leaning forward.  Ambulation/Gait Ambulation/Gait assistance: Min guard Ambulation Distance (Feet): 250 Feet Assistive device: Rolling walker (2 wheeled) Gait Pattern/deviations: Step-through pattern;Shuffle Gait velocity: decreased General Gait Details: right knee buckled x 1 during gait.  Per pt this is historically her weaker leg despite the fact that she has an old TKA on the left.   Stairs: Yes Stairs assistance: Supervision Stair Management: One rail Right;Step to pattern;Forwards;Sideways Number of Stairs: 18 (9 x 2) General stair comments: Pt tried stairs with L rail and R min hand held assist and then tried stairs sideways with both hands on railing step to pattern with both tries (supervision for sideways).          PT Diagnosis: Difficulty walking;Abnormality of gait;Generalized weakness;Acute pain  PT Problem List: Decreased strength;Decreased activity tolerance;Decreased balance;Decreased mobility;Decreased knowledge of use of DME;Decreased knowledge of precautions;Pain;Obesity PT Treatment Interventions: DME instruction;Gait training;Stair training;Functional mobility training;Therapeutic activities;Therapeutic exercise;Balance training;Neuromuscular re-education;Patient/family education;Modalities     PT Goals(Current goals can be found in the care plan section) Acute Rehab PT Goals Patient Stated Goal: to be able to take care of myself PT Goal Formulation: With patient/family Time For Goal Achievement: 09/30/13 Potential to Achieve Goals: Good  Visit Information  Last PT Received On: 09/23/13 Assistance Needed: +1 History of Present Illness: 68 y.o. female admitted to [facility] on 09/22/13 for elective L4/5 fusion.  Prior Gary expects to be discharged to:: Private residence Living Arrangements: Children (son) Available Help at Discharge: Available PRN/intermittently Type of Home: House Home Access: Ramped entrance (at kitchen) Home Layout: Two level;Bed/bath upstairs Alternate Level Stairs-Number of Steps: flight Alternate Level Stairs-Rails: Left Home Equipment: Walker - 2 wheels;Shower seat;Grab bars - toilet;Grab bars - tub/shower (she has two walkers, but is unsure if they have wheels) Prior Function Level of Independence: Independent;Independent with assistive device(s) Comments: occationally used a cane.  Communication Communication: No difficulties Dominant Hand: Right    Cognition  Cognition Arousal/Alertness: Awake/alert Behavior During Therapy: Anxious Overall Cognitive Status: Within Functional Limits for tasks assessed    Extremity/Trunk Assessment Upper Extremity Assessment Upper Extremity Assessment: Defer to OT evaluation Lower Extremity Assessment Lower Extremity Assessment: Generalized weakness (right leg buckled x 1 during gait) Cervical / Trunk Assessment Cervical / Trunk Assessment: Normal   Balance Balance Overall balance assessment: Modified Independent General Comments General comments (skin integrity, edema, etc.): Pt very anxious about being prepared when she goes home.  She writes everything down.  End of Session PT - End of Session Equipment Utilized During Treatment: Back brace Activity Tolerance: Patient limited by fatigue;Patient limited by pain Patient left: in chair;with call bell/phone within reach;with family/visitor present Nurse Communication: Mobility status    Jenna Mcdonald, [place], DPT [phone]   09/23/2013, 2:11 PM

## 2013-09-23 NOTE — Progress Notes (Signed)
Occupational Therapy Evaluation Patient Details Name: Jenna Mcdonald MRN: [id number] DOB: [birth year] Today's Date: 09/23/2013 Time: 0940-1010 OT Time Calculation (min): 30 min  OT Assessment / Plan / Recommendation History of present illness Max access L3-5 fusion   Clinical Impression   PTA, pt lived with son and states she was mod I with ADL and mobility . Pt with decline in functional status. Pt appears anxious about her d/C plan. Educated pt on AE and use for LB ADL. Pt more calm after demonstrating how AE can increase her independence. Will plan to see in am prior to D/C. Rec HHOT services and 3 in 1.     OT Assessment  Patient needs continued OT Services    Follow Up Recommendations  Home health OT;Supervision - Intermittent    Barriers to Discharge Decreased caregiver support    Equipment Recommendations  3 in 1 bedside comode    Recommendations for Other Services    Frequency  Min 2X/week    Precautions / Restrictions Precautions Precautions: Back Precaution Booklet Issued: Yes (comment)   Pertinent Vitals/Pain C/o general surgical pain    ADL  Grooming: Supervision/safety;Set up Where Assessed - Grooming: Unsupported standing Upper Body Bathing: Supervision/safety;Set up Where Assessed - Upper Body Bathing: Unsupported sitting Lower Body Bathing: Moderate assistance Where Assessed - Lower Body Bathing: Supported sit to stand Upper Body Dressing: Set up;Minimal assistance (with brace) Where Assessed - Upper Body Dressing: Unsupported sitting Lower Body Dressing: Moderate assistance Where Assessed - Lower Body Dressing: Supported sit to Lobbyist: Magazine features editor Method: Sit to Loss adjuster, chartered: Therapist, occupational and Hygiene: Moderate assistance Where Assessed - Toileting Clothing Manipulation and Hygiene: Sit to stand from 3-in-1 or toilet Equipment Used: Back brace;Gait belt;Long-handled  shoe horn;Long-handled sponge;Reacher;Rolling walker;Sock aid Transfers/Ambulation Related to ADLs: minguard ADL Comments: Educated pt on AE for LB ADL and hygiene after toileting. Issued AE due to financial constraints.. Pt needs further education on AE/ADL    OT Diagnosis: Generalized weakness;Acute pain  OT Problem List: Decreased strength;Decreased activity tolerance;Decreased safety awareness;Decreased knowledge of use of DME or AE;Decreased knowledge of precautions;Obesity;Pain OT Treatment Interventions: Self-care/ADL training;DME and/or AE instruction;Therapeutic activities;Patient/family education   OT Goals(Current goals can be found in the care plan section) Acute Rehab OT Goals Patient Stated Goal: to be able to take care of myself OT Goal Formulation: With patient Time For Goal Achievement: 09/30/13 Potential to Achieve Goals: Good  Visit Information  Last OT Received On: 09/23/13 Assistance Needed: +1 History of Present Illness: Max access L4-5 fusion       Prior [place] expects to be discharged to:: Private residence Living Arrangements: Children Available Help at Discharge: Available PRN/intermittently Type of Home: House Home Access: Willows entrance (at kitchen) Home Layout: Two level;Bed/bath upstairs Alternate Level Stairs-Number of Steps: [place]: Environmental consultant - 2 wheels;Shower seat;Grab bars - toilet;Grab bars - tub/shower Prior Function Level of Independence: Independent;Independent with assistive device(s) (occ used cane) Communication Communication: No difficulties Dominant Hand: Right         Vision/Perception     Cognition  Cognition Arousal/Alertness: Awake/alert Behavior During Therapy: Anxious;Agitated Overall Cognitive Status: Within Functional Limits for tasks assessed    Extremity/Trunk Assessment Upper Extremity Assessment Upper Extremity Assessment: Overall WFL for tasks assessed Lower  Extremity Assessment Lower Extremity Assessment: Defer to PT evaluation Cervical / Trunk Assessment Cervical / Trunk Assessment: Normal     Mobility Transfers  Overall transfer level: Needs assistance Transfers: Sit to/from Stand Sit to Stand: Min guard General transfer comment: vc for back precautions     Exercise     Balance Balance Overall balance assessment: Modified Independent General Comments General comments (skin integrity, edema, etc.): very anxious about d/C plan. appeared somewhat calm after given AE for ADL   End of Session OT - End of Session Equipment Utilized During Treatment: Gait belt;Rolling walker;Back brace Activity Tolerance: Patient tolerated treatment well Patient left: in chair;with call bell/phone within reach Nurse Communication: Mobility status;Other (comment) (need for DMe)  GO     [name],HILLARY 09/23/2013, 1:53 PM [facility], OTR/L  [phone] 09/23/2013

## 2013-09-23 NOTE — Care Management Note (Signed)
CARE MANAGEMENT NOTE 09/23/2013  Patient:  Jenna Mcdonald, Jenna Mcdonald   Account Number:  [id number]  Date Initiated:  09/23/2013  Documentation initiated by:  Ricki Miller  Subjective/Objective Assessment:   68 yr old female s/p L3-4,L4-5 decompression lumbar laminectomy     Action/Plan:   Case Manager sppoke with pateint concerning home health needs . Choice offered.Patient requests Leonard Downing, and Barbaraann Barthel as therapist. CM gave referral to [place].   Anticipated DC Date:  09/24/2013   Anticipated DC Plan:  [place]  CM consult      [facility] Choice  HOME HEALTH   Choice offered to / List presented to:  C-1 Patient        [place] arranged  [place]   Status of service:  Completed, signed off Medicare Important Message given?   (If response is "NO", the following Medicare IM given date fields will be blank) Date Medicare IM given:   Date Additional Medicare IM given:    Discharge Disposition:  [place]

## 2013-09-24 LAB — GLUCOSE, CAPILLARY: Glucose-Capillary: 100 mg/dL — ABNORMAL HIGH (ref 70–99)

## 2013-09-24 MED ORDER — METHOCARBAMOL 500 MG PO TABS: 500.0000 mg | ORAL_TABLET | Freq: Four times a day (QID) | ORAL | Status: DC | PRN

## 2013-09-24 MED ORDER — OXYCODONE-ACETAMINOPHEN 5-325 MG PO TABS: 1.0000 | ORAL_TABLET | ORAL | Status: DC | PRN

## 2013-09-24 NOTE — Progress Notes (Signed)
   CARE MANAGEMENT NOTE 09/24/2013  Patient:  Jenna Mcdonald, Jenna Mcdonald   Account Number:  [id number]  Date Initiated:  09/23/2013  Documentation initiated by:  Ricki Miller  Subjective/Objective Assessment:   68 yr old female s/p L3-4,L4-5 decompression lumbar laminectomy     Action/Plan:   Case Manager sppoke with pateint concerning home health needs . Choice offered.Patient requests Leonard Downing, and Barbaraann Barthel as therapist. CM gave referral to [place].   Anticipated DC Date:  09/24/2013   Anticipated DC Plan:  [place]  CM consult      [facility] Choice  HOME HEALTH   Choice offered to / List presented to:  C-1 Patient        [place] arranged  [place]   Status of service:  Completed, signed off Medicare Important Message given?   (If response is "NO", the following Medicare IM given date fields will be blank) Date Medicare IM given:   Date Additional Medicare IM given:    Discharge Disposition:  [place]  Per UR Regulation:    If discussed at Long Length of Stay Meetings, dates discussed:    Comments:  09/24/13 10:15 CM spoke w/pt and requested Barbaraann Barthel to be her PT from [place].  CM explained I would pass the request along to [facility] but pt understands she may not be able to have this specific PT.  CM called [facility] [employer] rep to notify of discharge and passed along the request for PT Barbaraann Barthel. No DME needed as pt states she has everything she needs. No other CM needs were communicated. Mariane Masters, BSN, CM [phone].

## 2013-09-24 NOTE — Discharge Instructions (Signed)
Home Health physical therapy to be provided by [facility] care [phone]   Wound Care Keep incision covered and dry for two days.  Do not put any creams, lotions, or ointments on incision. Leave steri-strips on back.  They will fall off by themselves. Activity Walk each and every day, increasing distance each day. No lifting greater than 5 lbs.  Avoid excessive neck motion. No driving for 2 weeks; may ride as a passenger locally. If provided with back brace, wear when out of bed.  It is not necessary to wear brace in bed. Diet Resume your normal diet.  Return to Work Will be discussed at you follow up appointment. Call Your Doctor If Any of These Occur Redness, drainage, or swelling at the wound.  Temperature greater than 101 degrees. Severe pain not relieved by pain medication. Incision starts to come apart. Follow Up Appt Call today for appointment in 1-2 weeks ([phone]) or for problems.  If you have any hardware placed in your spine, you will need an x-ray before your appointment.

## 2013-09-24 NOTE — Discharge Summary (Signed)
Physician Discharge Summary  Patient ID: Jenna Mcdonald MRN: [id number] DOB/AGE: [birth year] 68 y.o.  Admit date: 09/22/2013 Discharge date: 09/24/2013  Admission Diagnoses: Spondylolisthesis with stenosis of the lumbar region   Discharge Diagnoses: Same   Discharged Condition: good  Hospital Course: The patient was admitted on 09/22/2013 and taken to the operating room where the patient underwent PLIF L3-4 L4-5. The patient tolerated the procedure well and was taken to the recovery room and then to the floor in stable condition. The hospital course was routine. There were no complications. The wound remained clean dry and intact. Pt had appropriate back soreness. No complaints of leg pain or new N/T/W. The patient remained afebrile with stable vital signs, and tolerated a regular diet. The patient continued to increase activities, and pain was well controlled with oral pain medications.   Consults: None  Significant Diagnostic Studies:  Results for orders placed during the hospital encounter of 09/22/13  PROTIME-INR      Result Value Range   Prothrombin Time 13.8  11.6 - 15.2 seconds   INR 1.08  0.00 - 1.49  APTT      Result Value Range   aPTT 31  24 - 37 seconds  GLUCOSE, CAPILLARY      Result Value Range   Glucose-Capillary 158 (*) 70 - 99 mg/dL  GLUCOSE, CAPILLARY      Result Value Range   Glucose-Capillary 155 (*) 70 - 99 mg/dL   Comment 1 Notify RN    GLUCOSE, CAPILLARY      Result Value Range   Glucose-Capillary 242 (*) 70 - 99 mg/dL  GLUCOSE, CAPILLARY      Result Value Range   Glucose-Capillary 214 (*) 70 - 99 mg/dL  GLUCOSE, CAPILLARY      Result Value Range   Glucose-Capillary 190 (*) 70 - 99 mg/dL  GLUCOSE, CAPILLARY      Result Value Range   Glucose-Capillary 169 (*) 70 - 99 mg/dL  GLUCOSE, CAPILLARY      Result Value Range   Glucose-Capillary 145 (*) 70 - 99 mg/dL  GLUCOSE, CAPILLARY      Result Value Range   Glucose-Capillary 222 (*) 70 - 99 mg/dL   GLUCOSE, CAPILLARY      Result Value Range   Glucose-Capillary 100 (*) 70 - 99 mg/dL   Comment 1 Documented in Chart      Chest 2 View  09/14/2013   CLINICAL DATA:  Preop for lumbar fusion  EXAM: CHEST  2 VIEW  COMPARISON:  09/11/2011  FINDINGS: Cardiomediastinal silhouette is stable. No acute infiltrate or pleural effusion. No pulmonary edema. Mild degenerative changes thoracic spine.  IMPRESSION: No active cardiopulmonary disease.   Electronically Signed   By: Lahoma Crocker M.D.   On: 09/14/2013 10:57   Dg Lumbar Spine 2-3 Views  09/22/2013   CLINICAL DATA:  L3-5 PLIF.  EXAM: LUMBAR SPINE - 2-3 VIEW; DG C-ARM 61-120 MIN  COMPARISON:  MRI lumbar spine 01/30/2013.  FINDINGS: We are provided with 2 fluoroscopic intraoperative spot views of the lower lumbar spine. Images demonstrate pedicle screws and stabilization bars and interbody spacers in place from L3-L5. Hardware is intact. No acute abnormality is identified.  IMPRESSION: L3-5 PLIF.   Electronically Signed   By: Inge Rise M.D.   On: 09/22/2013 15:15   Dg C-arm 61-120 Min  09/22/2013   CLINICAL DATA:  L3-5 PLIF.  EXAM: LUMBAR SPINE - 2-3 VIEW; DG C-ARM 61-120 MIN  COMPARISON:  MRI lumbar spine 01/30/2013.  FINDINGS: We are provided with 2 fluoroscopic intraoperative spot views of the lower lumbar spine. Images demonstrate pedicle screws and stabilization bars and interbody spacers in place from L3-L5. Hardware is intact. No acute abnormality is identified.  IMPRESSION: L3-5 PLIF.   Electronically Signed   By: Inge Rise M.D.   On: 09/22/2013 15:15    Antibiotics:  Anti-infectives   Start     Dose/Rate Route Frequency Ordered Stop   09/22/13 1600  ceFAZolin (ANCEF) IVPB 1 g/50 mL premix     1 g 100 mL/hr over 30 Minutes Intravenous Every 8 hours 09/22/13 1341 09/22/13 2346   09/22/13 0855  bacitracin 50,000 Units in sodium chloride irrigation 0.9 % 500 mL irrigation  Status:  Discontinued       As needed 09/22/13 0856 09/22/13  1152   09/22/13 0600  ceFAZolin (ANCEF) IVPB 2 g/50 mL premix     2 g 100 mL/hr over 30 Minutes Intravenous On call to O.R. 09/21/13 1441 09/22/13 0830      Discharge Exam: Blood pressure 111/63, pulse 73, temperature 98.5 F (36.9 C), temperature source Oral, resp. rate 18, SpO2 97.00%. Neurologic: Grossly normal Incision clean dry and intact  Discharge Medications:     Medication List         calcitRIOL 0.25 MCG capsule  Commonly known as:  ROCALTROL  Take 0.25 mcg by mouth daily.     CALCIUM 600 + D PO  Take 1 tablet by mouth 2 (two) times daily.     diltiazem 90 MG tablet  Commonly known as:  CARDIZEM  Take 90 mg by mouth every 12 (twelve) hours.     DUREZOL OP  Apply to eye. 1 drop bid rt eye finished 1.22.15     glimepiride 4 MG tablet  Commonly known as:  AMARYL  Take 4 mg by mouth daily with breakfast.     magnesium oxide 400 MG tablet  Commonly known as:  MAG-OX  Take 400 mg by mouth daily.     methocarbamol 500 MG tablet  Commonly known as:  ROBAXIN  Take 1 tablet (500 mg total) by mouth every 6 (six) hours as needed for muscle spasms.     multivitamin with minerals Tabs tablet  Take 1 tablet by mouth daily.     oxyCODONE-acetaminophen 5-325 MG per tablet  Commonly known as:  PERCOCET/ROXICET  Take 1-2 tablets by mouth every 4 (four) hours as needed for moderate pain.     warfarin 5 MG tablet  Commonly known as:  COUMADIN  Take 5-7.5 mg by mouth every evening. Monday - Saturday 5 mg, Sundays 7.5 mg        Disposition: Home   Final Dx: PLIF L3-4 L4-5      Discharge Orders   Future Appointments Provider Department Dept Phone   11/02/2013 9:15 AM Sueanne Margarita, MD [place] [phone]   05/10/2014 2:45 PM [facility] [place]  IMAGING [phone]   Please wear two piece clothing and wear no powder or deodorant. Please arrive 15 minutes early prior to your appointment time.   06/29/2014 3:00 PM  [facility] [place] [facility] [phone]   06/29/2014 3:30 PM Chauncey Cruel, MD [facility] [phone]   Future Orders Complete By Expires   Call MD for:  difficulty breathing, headache or visual disturbances  As directed    Call MD for:  hives  As  directed    Call MD for:  persistant dizziness or light-headedness  As directed    Call MD for:  persistant nausea and vomiting  As directed    Call MD for:  redness, tenderness, or signs of infection (pain, swelling, redness, odor or green/yellow discharge around incision site)  As directed    Call MD for:  severe uncontrolled pain  As directed    Call MD for:  temperature >100.4  As directed    Diet - low sodium heart healthy  As directed    Discharge instructions  As directed    Comments:     No strenuous activity, no driving, no heavy lifting, and no bending or twisting, may shower normally   Increase activity slowly  As directed       Follow-up Information   Follow up with Alvis Pulcini S, MD In 2 weeks.   Specialty:  Neurosurgery   Contact information:   [address] [place] [address] [phone]        Signed: Eustace Moore 09/24/2013, 9:10 AM

## 2013-09-24 NOTE — Progress Notes (Signed)
Physical Therapy Treatment Patient Details Name: Jenna Mcdonald MRN: [id number] DOB: Jan 05, 1946 Today's Date: 09/24/2013 Time: 0712-1975 PT Time Calculation (min): 13 min  PT Assessment / Plan / Recommendation  History of Present Illness 68 y.o. female admitted to [facility] on 09/22/13 for elective L4/5 fusion.     PT Comments   Patient making good gains with mobility.  Did well on stairs.  Ready for d/c from PT perspective.  Agree with need for HHPT f/u.  Follow Up Recommendations  Home health PT;Supervision - Intermittent (Home safety eval)     Does the patient have the potential to tolerate intense rehabilitation     Barriers to Discharge        Equipment Recommendations  Rolling walker with 5" wheels    Recommendations for Other Services    Frequency Min 5X/week   Progress towards PT Goals Progress towards PT goals: Progressing toward goals  Plan Current plan remains appropriate;Equipment recommendations need to be updated    Precautions / Restrictions Precautions Precautions: Back Precaution Comments: Reviewed precautions with patient Required Braces or Orthoses: Spinal Brace Spinal Brace: Lumbar corset;Applied in sitting position Restrictions Weight Bearing Restrictions: No   Pertinent Vitals/Pain     Mobility  Bed Mobility General bed mobility comments: Patient able to don brace independently in sitting. Transfers Overall transfer level: Needs assistance Equipment used: Rolling walker (2 wheeled) Transfers: Sit to/from Stand Sit to Stand: Modified independent (Device/Increase time) General transfer comment: Patient demonstrates proper technique Ambulation/Gait Ambulation/Gait assistance: Supervision Ambulation Distance (Feet): 120 Feet Assistive device: Rolling walker (2 wheeled) Gait Pattern/deviations: Step-through pattern;Decreased stride length Gait velocity: decreased General Gait Details: Patient using safe technique with RW.  Cues to move at safe pace.   Patient reports bil. knee weakness. Stairs: Yes Stairs assistance: Supervision Stair Management: One rail Left;Step to pattern;Sideways Number of Stairs: 10 General stair comments: Reviewed proper technique using 1 rail.  Able to perform with supervision.      PT Goals (current goals can now be found in the care plan section)    Visit Information  Last PT Received On: 09/24/13 Assistance Needed: +1 History of Present Illness: 68 y.o. female admitted to [facility] on 09/22/13 for elective L4/5 fusion.      Subjective Data  Subjective: Patient with questions regarding stairs and f/u - answered.   Cognition  Cognition Arousal/Alertness: Awake/alert Behavior During Therapy: Anxious Overall Cognitive Status: Within Functional Limits for tasks assessed    Balance     End of Session PT - End of Session Equipment Utilized During Treatment: Gait belt;Back brace Activity Tolerance: Patient tolerated treatment well Patient left: in bed;with call bell/phone within reach (sitting EOB) Nurse Communication: Mobility status   GP     Despina Pole 09/24/2013, 11:13 AM Carita Pian. Sanjuana Kava, [place] Pager [phone]

## 2013-09-24 NOTE — Progress Notes (Signed)
Occupational Therapy Treatment Patient Details Name: Jenna Mcdonald MRN: [id number] DOB: May 08, 1946 Today's Date: 09/24/2013 Time: 7473-4037 OT Time Calculation (min): 18 min  OT Assessment / Plan / Recommendation  History of present illness 68 y.o. female admitted to [facility] on 09/22/13 for elective L4/5 fusion.     OT comments  Completed all education. Pt making excellent progress. Using AE for ADL and is very excited about her ability to complete her own self care. continue to rec follow up with [place].   Follow Up Recommendations  Home health OT;Supervision - Intermittent    Barriers to Discharge       Equipment Recommendations  3 in 1 bedside comode    Recommendations for Other Services    Frequency Min 2X/week   Progress towards OT Goals Progress towards OT goals: Progressing toward goals  Plan Discharge plan remains appropriate    Precautions / Restrictions Precautions Precautions: Back Precaution Booklet Issued: Yes (comment) Precaution Comments: Reviewed precautions with patient Required Braces or Orthoses: Spinal Brace Spinal Brace: Lumbar corset;Applied in sitting position Restrictions Weight Bearing Restrictions: No   Pertinent Vitals/Pain no apparent distress     ADL  ADL Comments: completed educaiton with pt regarding use of AE for ADL. Pt able to dress herself after set up with use of AE. Pt very excited about her progress    OT Diagnosis:    OT Problem List:   OT Treatment Interventions:     OT Goals(current goals can now be found in the care plan section) Acute Rehab OT Goals Patient Stated Goal: to be able to take care of myself OT Goal Formulation: With patient Time For Goal Achievement: 09/30/13 Potential to Achieve Goals: Good ADL Goals Pt Will Perform Lower Body Bathing: with modified independence;with adaptive equipment;sit to/from stand Pt Will Perform Lower Body Dressing: with modified independence;with adaptive equipment;sit to/from stand Pt  Will Transfer to Toilet: with modified independence;ambulating;bedside commode Pt Will Perform Toileting - Clothing Manipulation and hygiene: with modified independence;sit to/from stand;with adaptive equipment  Visit Information  Last OT Received On: 09/24/13 Assistance Needed: +1 History of Present Illness: 68 y.o. female admitted to [facility] on 09/22/13 for elective L4/5 fusion.      Subjective Data      Prior Functioning       Cognition  Cognition Arousal/Alertness: Awake/alert Behavior During Therapy: WFL for tasks assessed/performed Overall Cognitive Status: Within Functional Limits for tasks assessed    Mobility  Bed Mobility Overal bed mobility: Modified Independent General bed mobility comments: Patient able to don brace independently in sitting. Transfers Overall transfer level: Modified independent Equipment used: Rolling walker (2 wheeled) Transfers: Sit to/from Stand Sit to Stand: Modified independent (Device/Increase time) General transfer comment: Patient demonstrates proper technique    Exercises      Balance    End of Session OT - End of Session Equipment Utilized During Treatment: Rolling walker;Back brace Activity Tolerance: Patient tolerated treatment well Patient left: in bed;with call bell/phone within reach Nurse Communication: Mobility status  GO     Eastyn Dattilo,HILLARY 09/24/2013, 11:26 AM Maurie Boettcher, OTR/L  [phone] 09/24/2013

## 2013-09-24 NOTE — Progress Notes (Signed)
Pt. Alert and oriented,follows simple instructions, denies pain. Incision area without swelling, redness or S/S of infection. Voiding adequate clear yellow urine. Moving all extremities well and vitals stable and documented. Patient discharged home with family. Lumbar surgery notes instructions given to patient and family member for home safety and precautions. Pt. and family stated understanding of instructions given. Home health needs is arranged by care management.

## 2013-09-26 ENCOUNTER — Encounter (HOSPITAL_COMMUNITY): Payer: Self-pay | Admitting: Neurological Surgery

## 2013-10-13 ENCOUNTER — Telehealth: Payer: Self-pay

## 2013-10-13 MED ORDER — DILTIAZEM HCL 90 MG PO TABS: 90.0000 mg | ORAL_TABLET | Freq: Two times a day (BID) | ORAL | Status: DC

## 2013-10-13 NOTE — Telephone Encounter (Signed)
RX sent in for pt  

## 2013-10-17 ENCOUNTER — Other Ambulatory Visit: Payer: Self-pay | Admitting: *Deleted

## 2013-10-17 MED ORDER — DILTIAZEM HCL 90 MG PO TABS: 90.0000 mg | ORAL_TABLET | Freq: Two times a day (BID) | ORAL | Status: DC

## 2013-10-17 NOTE — Telephone Encounter (Signed)
Patient stated rx was supposed to be sent to [facility] not [facility]. I will re-send for her.

## 2013-10-22 ENCOUNTER — Encounter: Payer: Self-pay | Admitting: Oncology

## 2013-11-02 ENCOUNTER — Ambulatory Visit: Payer: Medicare PPO | Admitting: Cardiology

## 2013-11-16 ENCOUNTER — Ambulatory Visit: Payer: Medicare PPO | Attending: Neurological Surgery | Admitting: Physical Therapy

## 2013-11-16 DIAGNOSIS — M545 Low back pain, unspecified: Secondary | ICD-10-CM | POA: Insufficient documentation

## 2013-11-16 DIAGNOSIS — IMO0001 Reserved for inherently not codable concepts without codable children: Secondary | ICD-10-CM | POA: Insufficient documentation

## 2013-11-17 ENCOUNTER — Encounter: Payer: Self-pay | Admitting: General Surgery

## 2013-11-17 DIAGNOSIS — G4733 Obstructive sleep apnea (adult) (pediatric): Secondary | ICD-10-CM | POA: Insufficient documentation

## 2013-11-21 ENCOUNTER — Ambulatory Visit: Payer: Medicare PPO | Admitting: Physical Therapy

## 2013-11-23 ENCOUNTER — Ambulatory Visit: Payer: Medicare PPO | Attending: Neurological Surgery | Admitting: Physical Therapy

## 2013-11-23 DIAGNOSIS — IMO0001 Reserved for inherently not codable concepts without codable children: Secondary | ICD-10-CM | POA: Insufficient documentation

## 2013-11-23 DIAGNOSIS — M545 Low back pain, unspecified: Secondary | ICD-10-CM | POA: Insufficient documentation

## 2013-11-28 ENCOUNTER — Ambulatory Visit (INDEPENDENT_AMBULATORY_CARE_PROVIDER_SITE_OTHER): Payer: Medicare PPO | Admitting: Cardiology

## 2013-11-28 ENCOUNTER — Encounter: Payer: Self-pay | Admitting: Cardiology

## 2013-11-28 VITALS — BP 122/71 | HR 62 | Ht 64.5 in | Wt 236.0 lb

## 2013-11-28 DIAGNOSIS — I4891 Unspecified atrial fibrillation: Secondary | ICD-10-CM

## 2013-11-28 DIAGNOSIS — G4733 Obstructive sleep apnea (adult) (pediatric): Secondary | ICD-10-CM

## 2013-11-28 DIAGNOSIS — I10 Essential (primary) hypertension: Secondary | ICD-10-CM

## 2013-11-28 DIAGNOSIS — I480 Paroxysmal atrial fibrillation: Secondary | ICD-10-CM

## 2013-11-28 NOTE — Progress Notes (Signed)
Jenna Mcdonald, [address] [place], [place]  [address] Phone: [phone] Fax:  [phone]  Date:  11/28/2013   ID:  Jenna Mcdonald, DOB 25-Sep-1945, MRN [id number]  PCP:  Henrine Screws, MD  Cardiologist:  Fransico Him, MD     History of Present Illness: Jenna Mcdonald is a 68 y.o. female with a history of OSA on CPAP, HTN, PAF and morbid obesity who presents today for followup.  She is doing well.  She denies any chest pain, SOB, DOE, LE edema, dizziness, palpitations or syncope.  She tolerates her CPAP without any problems.  She feels rested in the am and has no daytime sleepiness but lately has had some problems with staying asleep at night 2 nights recently.  She tolerates her mask and feels the pressure is adequate.   Wt Readings from Last 3 Encounters:  09/14/13 243 lb 9.6 oz (110.496 kg)  06/28/13 244 lb 8 oz (110.904 kg)  05/17/12 232 lb 8 oz (105.461 kg)     Past Medical History  Diagnosis Date  . Diabetes mellitus   . A-fib   . Obesity   . Hypertension   . Irritable bowel   . Osteoarthritis   . Degenerative disc disease, lumbar     of the knees  . PONV (postoperative nausea and vomiting)   . Pneumonia   . Anemia   . Insomnia   . Thalassemia minor   . Empty sella syndrome   . Sleep apnea     bipap  . Gastroesophageal reflux disease     denies  . Breast CA     left  . Hypercholesteremia   . Vitamin D deficiency   . Low back pain   . Metabolic syndrome   . Osteopenia   . PAF (paroxysmal atrial fibrillation)   . Sigmoid diverticulosis   . Fatigue     Severe secondary to to gemfibrozil  . Hammertoe     left second toe    Current Outpatient Prescriptions  Medication Sig Dispense Refill  . calcitRIOL (ROCALTROL) 0.25 MCG capsule Take 0.25 mcg by mouth daily.      . Calcium Carbonate-Vitamin D (CALCIUM 600 + D PO) Take 1 tablet by mouth 2 (two) times daily.      . Difluprednate (DUREZOL OP) Apply to eye. 1 drop bid rt eye finished 1.22.15        . diltiazem (CARDIZEM) 90 MG tablet Take 1 tablet (90 mg total) by mouth every 12 (twelve) hours.  180 tablet  1  . glimepiride (AMARYL) 4 MG tablet Take 4 mg by mouth daily with breakfast.      . magnesium oxide (MAG-OX) 400 MG tablet Take 400 mg by mouth daily.      . methocarbamol (ROBAXIN) 500 MG tablet Take 1 tablet (500 mg total) by mouth every 6 (six) hours as needed for muscle spasms.  60 tablet  1  . Multiple Vitamin (MULITIVITAMIN WITH MINERALS) TABS Take 1 tablet by mouth daily.      Marland Kitchen oxyCODONE-acetaminophen (PERCOCET/ROXICET) 5-325 MG per tablet Take 1-2 tablets by mouth every 4 (four) hours as needed for moderate pain.  90 tablet  0  . warfarin (COUMADIN) 5 MG tablet Take 5-7.5 mg by mouth every evening. Monday - Saturday 5 mg, Sundays 7.5 mg      . [DISCONTINUED] flecainide (TAMBOCOR) 50 MG tablet Take 1 tablet (50 mg total) by mouth every 12 (twelve) hours.  60 tablet  12  No current facility-administered medications for this visit.    Allergies:    Allergies  Allergen Reactions  . Aromasin [Exemestane]     Hair loss   . Femara [Letrozole]   . Gemfibrozil     Severe fatigue   . Letrozole Other (See Comments)    Unknown reactions   . Metformin And Related Diarrhea    500 MG AM and PM SEVERE Diarrhea  . Metoprolol Diarrhea    Fatigue   . Tamoxifen     Bone and body aches   . Tetracyclines & Related Other (See Comments)    Vaginal infections    . Toprol Xl [Metoprolol Tartrate]     Hair loss  . Vicodin [Hydrocodone-Acetaminophen] Nausea And Vomiting    Patient reports terrible nausea; plain tylenol is ok with patient    Social History:  The patient  reports that she quit smoking about 10 years ago. Her smoking use included Cigarettes. She has a 5 pack-year smoking history. She does not have any smokeless tobacco history on file. She reports that she does not drink alcohol or use illicit drugs.   Family History:  The patient's family history includes  Anesthesia problems in her mother; Diabetes in her father and mother.   ROS:  Please see the history of present illness.      All other systems reviewed and negative.   PHYSICAL EXAM: VS:  There were no vitals taken for this visit. Well nourished, well developed, in no acute distress HEENT: normal Neck: no JVD Cardiac:  normal S1, S2; RRR; no murmur Lungs:  clear to auscultation bilaterally, no wheezing, rhonchi or rales Abd: soft, nontender, no hepatomegaly Ext: no edema Skin: warm and dry Neuro:  CNs 2-12 intact, no focal abnormalities noted     ASSESSMENT AND PLAN:  1. OSA on CPAP and tolerating well - I will get a download from her DME 2. Morbid Obesity 3. HTN - well controlled - continue Cardizem 4. PAF maintaining NSR - continue Cardizem/Warfarin 5. Chronic systemic anticoagulation  Followup with me in  6 months  Signed, Fransico Him, MD 11/28/2013 9:21 AM

## 2013-11-28 NOTE — Patient Instructions (Signed)
Your physician recommends that you continue on your current medications as directed. Please refer to the Current Medication list given to you today.  Your physician wants you to follow-up in: 6 months. You will receive a reminder letter in the mail two months in advance. If you don't receive a letter, please call our office to schedule the follow-up appointment.  

## 2013-11-29 ENCOUNTER — Ambulatory Visit: Payer: Medicare PPO | Admitting: Physical Therapy

## 2013-12-02 ENCOUNTER — Encounter: Payer: Self-pay | Admitting: General Surgery

## 2013-12-02 ENCOUNTER — Ambulatory Visit: Payer: Medicare PPO | Admitting: Physical Therapy

## 2013-12-06 ENCOUNTER — Ambulatory Visit: Payer: Medicare PPO | Admitting: Physical Therapy

## 2013-12-09 ENCOUNTER — Ambulatory Visit: Payer: Medicare PPO | Admitting: Physical Therapy

## 2013-12-12 ENCOUNTER — Ambulatory Visit: Payer: Medicare PPO | Admitting: Physical Therapy

## 2013-12-13 ENCOUNTER — Ambulatory Visit: Payer: Medicare PPO | Admitting: Physical Therapy

## 2013-12-14 ENCOUNTER — Other Ambulatory Visit: Payer: Self-pay | Admitting: Neurological Surgery

## 2013-12-14 DIAGNOSIS — M549 Dorsalgia, unspecified: Secondary | ICD-10-CM

## 2013-12-16 ENCOUNTER — Ambulatory Visit: Payer: Medicare PPO | Admitting: Physical Therapy

## 2013-12-21 ENCOUNTER — Ambulatory Visit
Admission: RE | Admit: 2013-12-21 | Discharge: 2013-12-21 | Disposition: A | Discharge status: Home / Self Care | Payer: Medicare PPO | Source: Ambulatory Visit | Attending: Neurological Surgery | Admitting: Neurological Surgery

## 2013-12-21 DIAGNOSIS — M549 Dorsalgia, unspecified: Secondary | ICD-10-CM

## 2013-12-21 MED ORDER — GADOBENATE DIMEGLUMINE 529 MG/ML IV SOLN
20.0000 mL | Freq: Once | INTRAVENOUS | Status: AC | PRN
  Administered 2013-12-21: 20 mL via INTRAVENOUS

## 2013-12-23 ENCOUNTER — Ambulatory Visit: Payer: Medicare PPO | Admitting: Physical Therapy

## 2013-12-28 ENCOUNTER — Ambulatory Visit: Payer: Medicare PPO | Attending: Neurological Surgery | Admitting: Physical Therapy

## 2013-12-28 DIAGNOSIS — IMO0001 Reserved for inherently not codable concepts without codable children: Secondary | ICD-10-CM | POA: Insufficient documentation

## 2013-12-28 DIAGNOSIS — M545 Low back pain, unspecified: Secondary | ICD-10-CM | POA: Insufficient documentation

## 2013-12-30 ENCOUNTER — Encounter: Payer: Self-pay | Admitting: Internal Medicine

## 2013-12-30 ENCOUNTER — Ambulatory Visit: Payer: Medicare PPO | Admitting: Physical Therapy

## 2013-12-30 ENCOUNTER — Telehealth: Payer: Self-pay | Admitting: Cardiology

## 2013-12-30 ENCOUNTER — Ambulatory Visit (INDEPENDENT_AMBULATORY_CARE_PROVIDER_SITE_OTHER): Payer: Medicare PPO | Admitting: Internal Medicine

## 2013-12-30 VITALS — BP 140/60 | HR 148 | Ht 64.5 in | Wt 226.0 lb

## 2013-12-30 DIAGNOSIS — R5381 Other malaise: Secondary | ICD-10-CM

## 2013-12-30 DIAGNOSIS — R0602 Shortness of breath: Secondary | ICD-10-CM

## 2013-12-30 DIAGNOSIS — I4891 Unspecified atrial fibrillation: Secondary | ICD-10-CM

## 2013-12-30 DIAGNOSIS — R5383 Other fatigue: Secondary | ICD-10-CM

## 2013-12-30 LAB — CBC
HCT: 35 % — ABNORMAL LOW (ref 36.0–46.0)
Hemoglobin: 11.1 g/dL — ABNORMAL LOW (ref 12.0–15.0)
MCH: 18.8 pg — ABNORMAL LOW (ref 26.0–34.0)
MCHC: 31.7 g/dL (ref 30.0–36.0)
MCV: 59.4 fL — ABNORMAL LOW (ref 78.0–100.0)
Platelets: 262 10*3/uL (ref 150–400)
RBC: 5.89 MIL/uL — ABNORMAL HIGH (ref 3.87–5.11)
RDW: 17.7 % — ABNORMAL HIGH (ref 11.5–15.5)
WBC: 9 10*3/uL (ref 4.0–10.5)

## 2013-12-30 LAB — BASIC METABOLIC PANEL
BUN: 17 mg/dL (ref 6–23)
CO2: 26 mEq/L (ref 19–32)
Calcium: 10 mg/dL (ref 8.4–10.5)
Chloride: 106 mEq/L (ref 96–112)
Creat: 1.06 mg/dL (ref 0.50–1.10)
Glucose, Bld: 92 mg/dL (ref 70–99)
Potassium: 4.4 mEq/L (ref 3.5–5.3)
Sodium: 144 mEq/L (ref 135–145)

## 2013-12-30 LAB — PROTIME-INR
INR: 1.78 — ABNORMAL HIGH (ref <1.50)
Prothrombin Time: 20.3 seconds — ABNORMAL HIGH (ref 11.6–15.2)

## 2013-12-30 LAB — BRAIN NATRIURETIC PEPTIDE: Brain Natriuretic Peptide: 248 pg/mL — ABNORMAL HIGH (ref 0.0–100.0)

## 2013-12-30 LAB — TSH: TSH: 1.511 u[IU]/mL (ref 0.350–4.500)

## 2013-12-30 MED ORDER — DILTIAZEM HCL 90 MG PO TABS: 90.0000 mg | ORAL_TABLET | Freq: Three times a day (TID) | ORAL | Status: DC

## 2013-12-30 NOTE — Telephone Encounter (Signed)
Spoke with pt who reports she has been waking up extremely tired for last 2 weeks.  Sometimes this will improve during day but usually does not. Feels very fatigued with any activities. Described as "feeling wiped out."  Using CPAP. Shortness of breath with daily activities. Has "dull" pain in chest which she describes as a pressure type feeling.  This has been going on for 2 weeks.  She does not think it ever goes completely away but at times she does not notice it as much.  Seems worse when taking a deep breath. No cough, cold or allergy symptoms. Does not feel like heart is racing. States blood pressure was elevated recently at physical therapy. Will review with Dr. Harrington Challenger

## 2013-12-30 NOTE — Patient Instructions (Signed)
Your physician has recommended you make the following change in your medication:  INCREASE DILTIAZEM TO 90 MG THREE TIMES A DAY  Your physician recommends that you return for lab work in: TODAY (TSH, CBC, INR, BMET, BNP)

## 2013-12-30 NOTE — Telephone Encounter (Signed)
New message     Patient c/o extremely tired, pressure on chest for a couple of days and sob

## 2013-12-30 NOTE — Telephone Encounter (Signed)
Reviewed with Dr. Harrington Challenger and she will see pt today at 3:00.  I spoke with pt and she will be here for appt today

## 2013-12-30 NOTE — Progress Notes (Signed)
HPI Jenna Mcdonald is a 68 y.o. female with a history of OSA on CPAP, HTN, PAF and morbid obesity who presents today because of fatigue, SOB The patinet says over the last couple wks she has felft very tired.  Has been more SOB with activity.  No CP  No palpitations.  No signif dizziness  Just feels like she can't do anything  Allergies  Allergen Reactions  . Aromasin [Exemestane]     Hair loss   . Femara [Letrozole]   . Gemfibrozil     Severe fatigue   . Letrozole Other (See Comments)    Unknown reactions   . Metformin And Related Diarrhea    500 MG AM and PM SEVERE Diarrhea  . Metoprolol Diarrhea    Fatigue   . Tamoxifen     Bone and body aches   . Tetracyclines & Related Other (See Comments)    Vaginal infections    . Toprol Xl [Metoprolol Tartrate]     Hair loss  . Vicodin [Hydrocodone-Acetaminophen] Nausea And Vomiting    Patient reports terrible nausea; plain tylenol is ok with patient    Current Outpatient Prescriptions  Medication Sig Dispense Refill  . calcitRIOL (ROCALTROL) 0.25 MCG capsule Take 0.25 mcg by mouth daily.      . Calcium Carbonate-Vitamin D (CALCIUM 600 + D PO) Take 1 tablet by mouth 2 (two) times daily.      Marland Kitchen diltiazem (CARDIZEM) 90 MG tablet Take 1 tablet (90 mg total) by mouth every 12 (twelve) hours.  180 tablet  1  . glimepiride (AMARYL) 4 MG tablet Take 4 mg by mouth daily with breakfast.      . HYDROcodone-acetaminophen (NORCO/VICODIN) 5-325 MG per tablet       . magnesium oxide (MAG-OX) 400 MG tablet Take 400 mg by mouth daily.      . methocarbamol (ROBAXIN) 500 MG tablet Take 1 tablet (500 mg total) by mouth every 6 (six) hours as needed for muscle spasms.  60 tablet  1  . Multiple Vitamin (MULITIVITAMIN WITH MINERALS) TABS Take 1 tablet by mouth daily.      Marland Kitchen warfarin (COUMADIN) 5 MG tablet Take 5-7.5 mg by mouth every evening. Sun & Wed 1/2 tablet daily, M,T,TH,F,Sat. Take tablet daily.      . [DISCONTINUED] flecainide (TAMBOCOR) 50  MG tablet Take 1 tablet (50 mg total) by mouth every 12 (twelve) hours.  60 tablet  12   No current facility-administered medications for this visit.    Past Medical History  Diagnosis Date  . Diabetes mellitus   . A-fib   . Obesity   . Hypertension   . Irritable bowel   . Osteoarthritis   . Degenerative disc disease, lumbar     of the knees  . PONV (postoperative nausea and vomiting)   . Pneumonia   . Anemia   . Insomnia   . Thalassemia minor   . Empty sella syndrome   . Sleep apnea     bipap  . Gastroesophageal reflux disease     denies  . Breast CA     left  . Hypercholesteremia   . Vitamin D deficiency   . Low back pain   . Metabolic syndrome   . Osteopenia   . PAF (paroxysmal atrial fibrillation)   . Sigmoid diverticulosis   . Fatigue     Severe secondary to to gemfibrozil  . Hammertoe     left second toe    Past Surgical History  Procedure Laterality Date  . Replacement total knee Left 11  . Tonsillectomy    . Dilation and curettage of uterus    . Replacement total knee    . Tubal ligation    . Cystectomy  70    pilonidal   . Oophorectomy      wedge section not rem  . Breast surgery Left 10    lumpectomy  . Eye surgery Bilateral 12/14    cataracts  . Cesarean section    . Maximum access (mas)posterior lumbar interbody fusion (plif) 2 level N/A 09/22/2013    Procedure: FOR MAXIMUM ACCESS  POSTERIOR LUMBAR INTERBODY FUSION LUMBAR THREE-FOUR FOUR-FIVE;  Surgeon: Eustace Moore, MD;  Location: [facility];  Service: Neurosurgery;  Laterality: N/A;    Family History  Problem Relation Age of Onset  . Anesthesia problems Mother   . Diabetes Mother   . Diabetes Father     History   Social History  . Marital Status: Single    Spouse Name: N/A    Number of Children: N/A  . Years of Education: N/A   Occupational History  . Not on file.   Social History Main Topics  . Smoking status: Former Smoker -- 0.50 packs/day for 10 years    Types:  Cigarettes    Quit date: 11/12/2003  . Smokeless tobacco: Not on file  . Alcohol Use: No  . Drug Use: No  . Sexual Activity: No   Other Topics Concern  . Not on file   Social History Narrative  . No narrative on file    Review of Systems:  All systems reviewed.  They are negative to the above problem except as previously stated.  Vital Signs: BP 140/60  Pulse 148  Ht 5' 4.5" (1.638 m)  Wt 226 lb (102.513 kg)  BMI 38.21 kg/m2  Physical Exam patinet is in NAD HEENT:  Normocephalic, atraumatic. EOMI, PERRLA.  Neck: JVP is normal.  No bruits.  Lungs: clear to auscultation. No rales no wheezes.  Heart: irregular rate and rhythm. Normal S1, S2. No S3.   No significant murmurs. PMI not displaced.  Abdomen:  Supple, nontender. Normal bowel sounds. No masses. No hepatomegaly.  Extremities:   Good distal pulses throughout. tr lower extremity edema.  Musculoskeletal :moving all extremities.  Neuro:   alert and oriented x3.  CN II-XII grossly intact.  EKG Afib  148 bpm  Occasional PVC  ST depreesion V5/ V6, II, III, AVF    Assessment and Plan:  1.  Atrial fibrillation.  The patient denies palpitations.  Symptoms began at least a week ago. Exam does not show signif volume increase I would recomm increasing dilt to 90 tid.    She may need more.   She did not tolerate b blockers in past Will get labs today, esp INR If sympotms do not improve may need to consider cardioversion (+/-) TEE Discussed with patient (does have sleep apnea and strong gag)  Will contact patient on Mon to see how feeling, discuss labs.

## 2014-01-02 ENCOUNTER — Telehealth: Payer: Self-pay | Admitting: Internal Medicine

## 2014-01-02 NOTE — Telephone Encounter (Signed)
Pt feels good, like a different person! She states that last night at 7:30 pm, while on the phone, she "went back into rhythm". Her HR prior to last night was >110s and now it is in the 80s.  Pt would like to know what is the next step?  She would also like to know when she can "get up and go".  I advised pt to take it easy today and go to store, as she states she needs to, but not exert herself until after speaking w/ Harrington Challenger or her nurse. Pt is agreeable to plan.

## 2014-01-02 NOTE — Telephone Encounter (Signed)
Needs INR adjusted  Was below 2  Forward to coumadin clinic  Followed by America Brown  ? Can he do weekly x 4 wks Also would be good to come in and get EKG on this regimen while back in SR Needs f/u with T Turner or PA soon.

## 2014-01-02 NOTE — Telephone Encounter (Signed)
New message     Pt saw Dr Harrington Challenger last Friday----talk about her afib

## 2014-01-03 ENCOUNTER — Ambulatory Visit: Payer: Medicare PPO | Admitting: Physical Therapy

## 2014-01-03 NOTE — Telephone Encounter (Signed)
Spoke with pt, INR results forwarded to dr gates for them to adjust the warfarin. Follow up scheduled with dr Radford Pax

## 2014-01-04 ENCOUNTER — Ambulatory Visit (INDEPENDENT_AMBULATORY_CARE_PROVIDER_SITE_OTHER): Payer: Medicare PPO | Admitting: Cardiology

## 2014-01-04 ENCOUNTER — Encounter: Payer: Self-pay | Admitting: Cardiology

## 2014-01-04 VITALS — BP 126/64 | HR 81 | Ht 64.5 in | Wt 234.2 lb

## 2014-01-04 DIAGNOSIS — I10 Essential (primary) hypertension: Secondary | ICD-10-CM

## 2014-01-04 DIAGNOSIS — I4891 Unspecified atrial fibrillation: Secondary | ICD-10-CM

## 2014-01-04 DIAGNOSIS — R079 Chest pain, unspecified: Secondary | ICD-10-CM

## 2014-01-04 DIAGNOSIS — I480 Paroxysmal atrial fibrillation: Secondary | ICD-10-CM

## 2014-01-04 MED ORDER — PROPAFENONE HCL 225 MG PO TABS: ORAL_TABLET | ORAL | Status: DC

## 2014-01-04 NOTE — Progress Notes (Signed)
[place], Jenna Mcdonald, [place]  [address] Phone: [phone] Fax:  [phone]  Date:  01/04/2014   ID:  Jenna Mcdonald, DOB 1946/08/04, MRN [id number]  PCP:  Henrine Screws, MD  Cardiologist:  Fransico Him, MD     History of Present Illness: HPI  Jenna Mcdonald is a 68 y.o. female with a history of OSA on CPAP, HTN, PAF and morbid obesity who presents to the office on 5/9 because of fatigue, SOB.  She said that over the last couple wks she has felft very tired. Has been more SOB with activity. No CP No palpitations. No signif dizziness Just feels like she can't do anything.  She was found to be back in atrial fibrillation with RVR and diltiazem was increased to 90mg  TID.  SHe is intolerant to beta blocker.  She now presents back today for followup.  She is feeling much better today and is back in NSR.  She was seen on 5/8 and went back into normal rhythm on 5/10.  Her SOB and fatigue have resolved.  She says that she awakened around 3 am with some mild chest pressure .  She says that she has also had other episodes of CP recently as well that are nonexertional and occur mainly at night,    Wt Readings from Last 3 Encounters:  01/04/14 234 lb 3.2 oz (106.232 kg)  12/30/13 226 lb (102.513 kg)  11/28/13 236 lb (107.049 kg)     Past Medical History  Diagnosis Date  . Diabetes mellitus   . A-fib   . Obesity   . Hypertension   . Irritable bowel   . Osteoarthritis   . Degenerative disc disease, lumbar     of the knees  . PONV (postoperative nausea and vomiting)   . Pneumonia   . Anemia   . Insomnia   . Thalassemia minor   . Empty sella syndrome   . Sleep apnea     bipap  . Gastroesophageal reflux disease     denies  . Breast CA     left  . Hypercholesteremia   . Vitamin D deficiency   . Low back pain   . Metabolic syndrome   . Osteopenia   . PAF (paroxysmal atrial fibrillation)   . Sigmoid diverticulosis   . Fatigue     Severe secondary to to  gemfibrozil  . Hammertoe     left second toe    Current Outpatient Prescriptions  Medication Sig Dispense Refill  . calcitRIOL (ROCALTROL) 0.25 MCG capsule Take 0.25 mcg by mouth daily.      . Calcium Carbonate-Vitamin D (CALCIUM 600 + D PO) Take 1 tablet by mouth 2 (two) times daily.      Marland Kitchen diltiazem (CARDIZEM) 90 MG tablet Take 1 tablet (90 mg total) by mouth 3 (three) times daily.  90 tablet  3  . glimepiride (AMARYL) 4 MG tablet Take 4 mg by mouth daily with breakfast.      . HYDROcodone-acetaminophen (NORCO/VICODIN) 5-325 MG per tablet       . magnesium oxide (MAG-OX) 400 MG tablet Take 400 mg by mouth daily.      . methocarbamol (ROBAXIN) 500 MG tablet Take 1 tablet (500 mg total) by mouth every 6 (six) hours as needed for muscle spasms.  60 tablet  1  . Multiple Vitamin (MULITIVITAMIN WITH MINERALS) TABS Take 1 tablet by mouth daily.      Marland Kitchen warfarin (COUMADIN) 5 MG tablet  Take 5-7.5 mg by mouth every evening. Sun & Wed 1/2 tablet daily, M,T,TH,F,Sat. Take tablet daily.       No current facility-administered medications for this visit.    Allergies:    Allergies  Allergen Reactions  . Aromasin [Exemestane]     Hair loss   . Femara [Letrozole]   . Gemfibrozil     Severe fatigue   . Letrozole Other (See Comments)    Unknown reactions   . Metformin And Related Diarrhea    500 MG AM and PM SEVERE Diarrhea  . Metoprolol Diarrhea    Fatigue   . Tamoxifen     Bone and body aches   . Tetracyclines & Related Other (See Comments)    Vaginal infections    . Toprol Xl [Metoprolol Tartrate]     Hair loss  . Vicodin [Hydrocodone-Acetaminophen] Nausea And Vomiting    Patient reports terrible nausea; plain tylenol is ok with patient    Social History:  The patient  reports that she quit smoking about 10 years ago. Her smoking use included Cigarettes. She has a 5 pack-year smoking history. She does not have any smokeless tobacco history on file. She reports that she does not  drink alcohol or use illicit drugs.   Family History:  The patient's family history includes Anesthesia problems in her mother; Diabetes in her father and mother.   ROS:  Please see the history of present illness.      All other systems reviewed and negative.   PHYSICAL EXAM: VS:  BP 126/64  Pulse 81  Ht 5' 4.5" (1.638 m)  Wt 234 lb 3.2 oz (106.232 kg)  BMI 39.59 kg/m2 Well nourished, well developed, in no acute distress HEENT: normal Neck: no JVD Cardiac:  normal S1, S2; RRR; no murmur Lungs:  clear to auscultation bilaterally, no wheezing, rhonchi or rales Abd: soft, nontender, no hepatomegaly Ext: no edema Skin: warm and dry Neuro:  CNs 2-12 intact, no focal abnormalities noted  EKG:  NSR at 81bpm with nonspecific T abnormality     ASSESSMENT AND PLAN:  1.  HTN - well controlled - continue Cardizem  2.  PAF maintaining - now back in NSR - continue Cardizem/Warfarin  - I have given her a new Rx for PRN Rhythmol 5.  Chronic systemic anticoagulation 6.  Chest pain - atypical - she cannot walk on a treadmill due to back so will get a Lexiscan myoview  Followup with me in 6 months    Signed, Fransico Him, MD 01/04/2014 9:17 AM

## 2014-01-04 NOTE — Patient Instructions (Addendum)
START RYTHMOL 225 MG 1 TABLET AS NEEDED FOR BREAKTHROUGH AFIB   Your physician has requested that you have a lexiscan myoview. For further information please visit [email or web address]. Please follow instruction sheet, as given.  Your physician wants you to follow-up in: 6 month ov You will receive a reminder letter in the mail two months in advance. If you don't receive a letter, please call our office to schedule the follow-up appointment.

## 2014-01-04 NOTE — Addendum Note (Signed)
Addended by: Alvina Filbert B on: 01/04/2014 10:11 AM   Modules accepted: Orders

## 2014-01-20 ENCOUNTER — Other Ambulatory Visit: Payer: Self-pay | Admitting: *Deleted

## 2014-01-20 DIAGNOSIS — R5383 Other fatigue: Secondary | ICD-10-CM

## 2014-01-20 DIAGNOSIS — I4891 Unspecified atrial fibrillation: Secondary | ICD-10-CM

## 2014-01-20 DIAGNOSIS — R0602 Shortness of breath: Secondary | ICD-10-CM

## 2014-01-20 MED ORDER — DILTIAZEM HCL 90 MG PO TABS: 90.0000 mg | ORAL_TABLET | Freq: Three times a day (TID) | ORAL | Status: DC

## 2014-01-24 ENCOUNTER — Ambulatory Visit (HOSPITAL_COMMUNITY): Payer: Medicare PPO | Attending: Cardiology | Admitting: Radiology

## 2014-01-24 VITALS — BP 108/54 | Ht 64.5 in | Wt 236.0 lb

## 2014-01-24 DIAGNOSIS — I10 Essential (primary) hypertension: Secondary | ICD-10-CM | POA: Insufficient documentation

## 2014-01-24 DIAGNOSIS — R42 Dizziness and giddiness: Secondary | ICD-10-CM

## 2014-01-24 DIAGNOSIS — Z87891 Personal history of nicotine dependence: Secondary | ICD-10-CM | POA: Insufficient documentation

## 2014-01-24 DIAGNOSIS — R0602 Shortness of breath: Secondary | ICD-10-CM | POA: Insufficient documentation

## 2014-01-24 DIAGNOSIS — R079 Chest pain, unspecified: Secondary | ICD-10-CM | POA: Insufficient documentation

## 2014-01-24 DIAGNOSIS — I4891 Unspecified atrial fibrillation: Secondary | ICD-10-CM | POA: Insufficient documentation

## 2014-01-24 DIAGNOSIS — E119 Type 2 diabetes mellitus without complications: Secondary | ICD-10-CM | POA: Insufficient documentation

## 2014-01-24 DIAGNOSIS — R0989 Other specified symptoms and signs involving the circulatory and respiratory systems: Secondary | ICD-10-CM | POA: Insufficient documentation

## 2014-01-24 DIAGNOSIS — R0609 Other forms of dyspnea: Secondary | ICD-10-CM | POA: Insufficient documentation

## 2014-01-24 DIAGNOSIS — Z853 Personal history of malignant neoplasm of breast: Secondary | ICD-10-CM | POA: Insufficient documentation

## 2014-01-24 MED ORDER — METOPROLOL TARTRATE 5 MG/5ML IV SOLN
2.5000 mg | Freq: Two times a day (BID) | INTRAVENOUS | Status: DC | PRN
Start: 2014-01-24 — End: 2014-01-26
  Administered 2014-01-24: 2.5 mg via INTRAVENOUS

## 2014-01-24 MED ORDER — TECHNETIUM TC 99M SESTAMIBI GENERIC - CARDIOLITE
30.0000 | Freq: Once | INTRAVENOUS | Status: AC | PRN
  Administered 2014-01-24: 30 via INTRAVENOUS

## 2014-01-24 MED ORDER — REGADENOSON 0.4 MG/5ML IV SOLN
0.4000 mg | Freq: Once | INTRAVENOUS | Status: AC
  Administered 2014-01-24: 0.4 mg via INTRAVENOUS

## 2014-01-24 NOTE — Progress Notes (Signed)
[facility] [place], [place] [address] [phone]    Cardiology Nuclear Med Study  Jenna Mcdonald is a 68 y.o. female     MRN : [id number]     DOB: [birth year]  Procedure Date: 01/24/2014  Nuclear Med Background Indication for Stress Test:  Evaluation for Ischemia History:  H/O (L) Breast CA, AFIB RVR '12 ECHO: EF: 76 '13 MPI: EF: 69% NL  GXT NS ST T changes Cardiac Risk Factors: History of Smoking, Hypertension and NIDDM  Symptoms:  Chest Pain, DOE, Rapid HR and SOB   Nuclear Pre-Procedure Caffeine/Decaff Intake:  None> 12 hrs NPO After: 8:30pm   Lungs:  clear O2 Sat: 95% on room air. IV 0.9% NS with Angio Cath:  22g  IV Site: R Antecubital x 1, tolerated well IV Started by:  Irven Baltimore, RN  Chest Size (in):  44 Cup Size: DD  Height: 5' 4.5" (1.638 m)  Weight:  236 lb (107.049 kg)  BMI:  Body mass index is 39.9 kg/(m^2). Tech Comments:  No Glimepiride or medications this am. Irven Baltimore, RN.Pt with continuous dizziness/afib with RVR post infusion. Dr. Radford Pax consulted. Pt given lopressor x 2, 2.5mg  IVP; with 12 lead and vital check post pics. Dr Radford Pax cleared  pt to leave. Follow up Thursday with 12 lead check.    Nuclear Med Study 1 or 2 day study: 2 day  Stress Test Type:  Lexiscan  Reading MD: N/A  Order Authorizing Provider:  Fransico Him, MD  Resting Radionuclide: Technetium 47m Sestamibi  Resting Radionuclide Dose: 33.0 mCi    On     01-26-14  Stress Radionuclide:  Technetium 56m Sestamibi  Stress Radionuclide Dose: 33.0 mCi   On        01-24-14          Stress Protocol Rest HR: 114 Stress HR: 153  Rest BP: 108/54 Stress BP: 125/73  Exercise Time (min): n/a METS: n/a   Predicted Max HR: 152 bpm % Max HR: 100.66 bpm Rate Pressure Product: 19125   Dose of Adenosine (mg):  n/a Dose of Lexiscan: 0.4 mg  Dose of Atropine (mg): n/a Dose of Dobutamine: n/a mcg/kg/min (at max HR)  Stress Test Technologist: Perrin Maltese, EMT-P  Nuclear Technologist:  Vedia Pereyra, CNMT     Rest Procedure:  Myocardial perfusion imaging was performed at rest 45 minutes following the intravenous administration of Technetium 35m Sestamibi. Rest ECG: Atrial Fibrilliation with RVR, nonspecific ST-T wave abnormalities  Stress Procedure:  The patient received IV Lexiscan 0.4 mg over 15-seconds.  Technetium 59m Sestamibi injected at 30-seconds. This patient had dizziness with the Lexiscan injection. Quantitative spect images were obtained after a 45 minute delay. Stress ECG: No significant change from baseline ECG  QPS Raw Data Images:  There is a breast shadow that accounts for the anterior attenuation. Stress Images:  Normal homogeneous uptake in all areas of the myocardium. Rest Images:  Decreased uptake in the mid and distal anterior wall due to breast attenuation.  Subtraction (SDS):  No evidence of ischemia. Transient Ischemic Dilatation (Normal <1.22):0.91 Lung/Heart Ratio (Normal <0.45):  0.21  Quantitative Gated Spect Images QGS EDV:  NA QGS ESV:  NA  Impression Exercise Capacity:  Lexiscan with no exercise. BP Response:  Normal blood pressure response. Clinical Symptoms:  No significant symptoms noted. ECG Impression:  No significant ST segment change suggestive of ischemia. Comparison with Prior Nuclear Study: No significant change from previous study  Overall Impression:  Normal stress nuclear study. Atrial fibrillation with RVR.  LV Ejection Fraction: Study not gated.  LV Wall Motion:  NA   Dorothy Spark 01/26/2014

## 2014-01-26 ENCOUNTER — Ambulatory Visit (HOSPITAL_COMMUNITY): Payer: Medicare PPO | Attending: Cardiovascular Disease

## 2014-01-26 MED ORDER — TECHNETIUM TC 99M SESTAMIBI GENERIC - CARDIOLITE
30.0000 | Freq: Once | INTRAVENOUS | Status: AC | PRN
  Administered 2014-01-26: 30 via INTRAVENOUS

## 2014-02-14 ENCOUNTER — Telehealth: Payer: Self-pay | Admitting: Oncology

## 2014-02-14 NOTE — Telephone Encounter (Signed)
r/s pt per GM-Gm on PAL-will call pt to adv of time & date of new appt

## 2014-02-15 ENCOUNTER — Telehealth: Payer: Self-pay | Admitting: Oncology

## 2014-02-15 NOTE — Telephone Encounter (Signed)
cld & spoke to pt to adv of new appt time & date-pt understood

## 2014-05-10 ENCOUNTER — Ambulatory Visit
Admission: RE | Admit: 2014-05-10 | Discharge: 2014-05-10 | Disposition: A | Discharge status: Home / Self Care | Payer: Medicare PPO | Source: Ambulatory Visit | Attending: Oncology | Admitting: Oncology

## 2014-05-10 ENCOUNTER — Encounter (INDEPENDENT_AMBULATORY_CARE_PROVIDER_SITE_OTHER): Payer: Self-pay

## 2014-05-10 DIAGNOSIS — C50912 Malignant neoplasm of unspecified site of left female breast: Secondary | ICD-10-CM

## 2014-05-17 DIAGNOSIS — H35349 Macular cyst, hole, or pseudohole, unspecified eye: Secondary | ICD-10-CM | POA: Insufficient documentation

## 2014-05-17 DIAGNOSIS — Z9889 Other specified postprocedural states: Secondary | ICD-10-CM | POA: Insufficient documentation

## 2014-06-12 ENCOUNTER — Other Ambulatory Visit: Payer: Self-pay | Admitting: Otolaryngology

## 2014-06-12 DIAGNOSIS — H6502 Acute serous otitis media, left ear: Secondary | ICD-10-CM

## 2014-06-13 ENCOUNTER — Other Ambulatory Visit: Payer: Medicare PPO

## 2014-06-13 ENCOUNTER — Ambulatory Visit
Admission: RE | Admit: 2014-06-13 | Discharge: 2014-06-13 | Disposition: A | Discharge status: Home / Self Care | Payer: Medicare PPO | Source: Ambulatory Visit | Attending: Otolaryngology | Admitting: Otolaryngology

## 2014-06-13 DIAGNOSIS — H6502 Acute serous otitis media, left ear: Secondary | ICD-10-CM

## 2014-06-29 ENCOUNTER — Ambulatory Visit: Payer: Medicare PPO | Admitting: Oncology

## 2014-06-29 ENCOUNTER — Other Ambulatory Visit: Payer: Medicare PPO

## 2014-06-29 ENCOUNTER — Ambulatory Visit: Payer: Medicare PPO | Admitting: Hematology and Oncology

## 2014-07-06 ENCOUNTER — Ambulatory Visit (INDEPENDENT_AMBULATORY_CARE_PROVIDER_SITE_OTHER): Payer: Medicare PPO | Admitting: Physician Assistant

## 2014-07-06 ENCOUNTER — Encounter: Payer: Self-pay | Admitting: Physician Assistant

## 2014-07-06 ENCOUNTER — Other Ambulatory Visit: Payer: Self-pay | Admitting: *Deleted

## 2014-07-06 ENCOUNTER — Telehealth: Payer: Self-pay | Admitting: Physician Assistant

## 2014-07-06 VITALS — BP 124/60 | HR 70 | Ht 64.0 in | Wt 234.0 lb

## 2014-07-06 DIAGNOSIS — I480 Paroxysmal atrial fibrillation: Secondary | ICD-10-CM

## 2014-07-06 DIAGNOSIS — R0602 Shortness of breath: Secondary | ICD-10-CM

## 2014-07-06 DIAGNOSIS — I4891 Unspecified atrial fibrillation: Secondary | ICD-10-CM

## 2014-07-06 DIAGNOSIS — I10 Essential (primary) hypertension: Secondary | ICD-10-CM

## 2014-07-06 DIAGNOSIS — G4733 Obstructive sleep apnea (adult) (pediatric): Secondary | ICD-10-CM

## 2014-07-06 MED ORDER — ZOLPIDEM TARTRATE ER 6.25 MG PO TBCR: 6.2500 mg | EXTENDED_RELEASE_TABLET | Freq: Every evening | ORAL | Status: DC | PRN

## 2014-07-06 MED ORDER — DILTIAZEM HCL 90 MG PO TABS
90.0000 mg | ORAL_TABLET | Freq: Two times a day (BID) | ORAL | Status: DC
Start: 2014-07-06 — End: 2015-01-04

## 2014-07-06 NOTE — Progress Notes (Signed)
Date:  07/06/2014   ID:  ELISAMA THISSEN, DOB November 26, 1945, MRN [id number]  PCP:  Henrine Screws, MD  Primary Cardiologist:  Radford Pax    History of Present Illness: KEALI MCCRAW is a 68 y.o. female with a history of OSA on CPAP, HTN, PAF and morbid obesity who presented to the office on 5/9 because of fatigue, SOB. She was found to be back in atrial fibrillation with RVR and diltiazem was increased to 90mg  TID. SHe is intolerant to beta blocker.  She was seen on 5/8 and went back into normal rhythm on 5/10. Her SOB and fatigue resolved. The patient had a nuclear stress test on January 24, 2014 which was nonischemic.  She presents today for six-month evaluation.  She appeared very tearful today. She apparently has cerebrospinal spinal fluid in the mastoid process and needs to follow-up at [facility]. She also continues to have pain in her back. As far as her heart goes she feels she's doing well. She denies any chest pain, shortness of breath, orthopnea, dizziness.  She does say that her legs hurt all the time and wonders if this is related to her back.  The patient currently denies nausea, vomiting, fever, cough, congestion, abdominal pain, hematochezia, melena, lower extremity edema, claudication.  Wt Readings from Last 3 Encounters:  07/06/14 234 lb (106.142 kg)  01/24/14 236 lb (107.049 kg)  01/04/14 234 lb 3.2 oz (106.232 kg)     Past Medical History  Diagnosis Date  . Diabetes mellitus   . A-fib   . Obesity   . Hypertension   . Irritable bowel   . Osteoarthritis   . Degenerative disc disease, lumbar     of the knees  . PONV (postoperative nausea and vomiting)   . Pneumonia   . Anemia   . Insomnia   . Thalassemia minor   . Empty sella syndrome   . Sleep apnea     bipap  . Gastroesophageal reflux disease     denies  . Breast CA     left  . Hypercholesteremia   . Vitamin D deficiency   . Low back pain   . Metabolic syndrome   . Osteopenia   . PAF  (paroxysmal atrial fibrillation)   . Sigmoid diverticulosis   . Fatigue     Severe secondary to to gemfibrozil  . Hammertoe     left second toe    Current Outpatient Prescriptions  Medication Sig Dispense Refill  . Calcium Carbonate-Vitamin D (CALCIUM 600 + D PO) Take 1 tablet by mouth daily.     Marland Kitchen diltiazem (CARDIZEM) 90 MG tablet Take 1 tablet (90 mg total) by mouth 3 (three) times daily. (Patient taking differently: Take 90 mg by mouth 2 (two) times daily. ) 270 tablet 1  . magnesium oxide (MAG-OX) 400 MG tablet Take 400 mg by mouth daily. Takes three times each week.    . METFORMIN HCL ER PO Take 500 mg by mouth daily.    . Multiple Vitamin (MULITIVITAMIN WITH MINERALS) TABS Take 1 tablet by mouth daily.    Marland Kitchen warfarin (COUMADIN) 5 MG tablet Take 5-7.5 mg by mouth every evening. Take once a day.    . calcitRIOL (ROCALTROL) 0.25 MCG capsule Take 0.25 mcg by mouth daily.    Marland Kitchen glimepiride (AMARYL) 4 MG tablet Take 4 mg by mouth daily with breakfast.    . HYDROcodone-acetaminophen (NORCO/VICODIN) 5-325 MG per tablet     . methocarbamol (ROBAXIN) 500 MG tablet  Take 1 tablet (500 mg total) by mouth every 6 (six) hours as needed for muscle spasms. 60 tablet 1  . propafenone (RYTHMOL) 225 MG tablet 1 tablet as needed for breakthrough AFib 15 tablet 3  . zolpidem (AMBIEN CR) 6.25 MG CR tablet Take 1 tablet (6.25 mg total) by mouth at bedtime as needed for sleep. 30 tablet 1  . [DISCONTINUED] flecainide (TAMBOCOR) 50 MG tablet Take 1 tablet (50 mg total) by mouth every 12 (twelve) hours. 60 tablet 12   No current facility-administered medications for this visit.    Allergies:    Allergies  Allergen Reactions  . Aromasin [Exemestane]     Hair loss   . Femara [Letrozole]   . Gemfibrozil     Severe fatigue   . Letrozole Other (See Comments)    Unknown reactions   . Metformin And Related Diarrhea    500 MG AM and PM SEVERE Diarrhea  . Metoprolol Diarrhea    Fatigue   . Tamoxifen      Bone and body aches   . Tetracyclines & Related Other (See Comments)    Vaginal infections    . Toprol Xl [Metoprolol Tartrate]     Hair loss  . Vicodin [Hydrocodone-Acetaminophen] Nausea And Vomiting    Patient reports terrible nausea; plain tylenol is ok with patient    Social History:  The patient  reports that she quit smoking about 10 years ago. Her smoking use included Cigarettes. She has a 5 pack-year smoking history. She does not have any smokeless tobacco history on file. She reports that she does not drink alcohol or use illicit drugs.   Family history:   Family History  Problem Relation Age of Onset  . Anesthesia problems Mother   . Diabetes Mother   . Diabetes Father     ROS:  Please see the history of present illness.  All other systems reviewed and negative.   PHYSICAL EXAM: VS:  BP 124/60 mmHg  Pulse 70  Ht 5\' 4"  (1.626 m)  Wt 234 lb (106.142 kg)  BMI 40.15 kg/m2 obese, well developed, in no acute distress HEENT: Pupils are equal round react to light accommodation extraocular movements are intact.  Neck: no JVDNo cervical lymphadenopathy. Cardiac: Regular rate and rhythm without murmurs rubs or gallops. Lungs:  clear to auscultation bilaterally, no wheezing, rhonchi or rales Abd: soft, nontender, positive bowel sounds all quadrants, Ext: no lower extremity edema.  2+ radial and dorsalis pedis pulses. Skin: warm and dry Neuro:  Grossly normal   ASSESSMENT AND PLAN:  Problem List Items Addressed This Visit    Essential hypertension    Blood pressure is well-controlled    Obesity, Class III, BMI 40-49.9 (morbid obesity)    Patient be referred to a registered dietitian for medical nutrition therapy.    Relevant Medications      METFORMIN HCL ER PO   OSA (obstructive sleep apnea)    Reports having a hard time CPAP and not getting good sleep. She brought in an article which talked about a device which is like a pacemaker and wants to know if this is an  alternative to the CPAP. I will look into this.    Paroxysmal atrial fibrillation - Primary    Patient is maintaining a controlled irregular rhythm on exam

## 2014-07-06 NOTE — Telephone Encounter (Signed)
Routed to [employer]

## 2014-07-06 NOTE — Assessment & Plan Note (Signed)
Blood pressure is well controlled 

## 2014-07-06 NOTE — Assessment & Plan Note (Signed)
Patient is maintaining a controlled irregular rhythm on exam

## 2014-07-06 NOTE — Telephone Encounter (Signed)
Pt came in this am and was prescribed Ambien.  Left here to go [facility]  I called [facility] at least 10 times phone was not working.  Called pt on cell phone 5 times pt did not pick up.  Finally after one hour got through to [facility]. Pt called back this pm s/w pt was very upset stated medication list was wrong.  Stated [employer], CMA went over all medications while pt was standing next to CMA.  Stated this always happens to her and someone will get sued one day.  Stated what happens if pt ended up in hospital with the wrong medication list.  I apologized profusely stated I will go over medication list with pt  and I will speak to Emeline Darling our supervisor.  Pt stated appreciation but also stated again how mad she was.  I stated after I go over medication list with patient I will mail patient a copy.  Stated appreciation.  Pt stated again how mad she was and I stated can I get a supervisor to call you back.  Pt stated yes.  I will route this note to Emeline Darling and give copy to [employer], CMA to state her side.  I also put a copy of medication in the mail today.

## 2014-07-06 NOTE — Telephone Encounter (Signed)
S/w pt to let pt know medication list was mailed. Pt also stated earlier when I told pt call on cell phone 5 times pt stated she does not answer cell phone.

## 2014-07-06 NOTE — Assessment & Plan Note (Signed)
Patient be referred to a registered dietitian for medical nutrition therapy.

## 2014-07-06 NOTE — Assessment & Plan Note (Addendum)
Reports having a hard time using the CPAP and not getting good sleep. She brought in an article which talked about a device which is like a pacemaker and wants to know if this is an alternative to the CPAP.  The device is designed to stimulate the hypoglossal nerve when sensing respirations.  Long term affects on the hypoglossal nerve are unknown.  Her BMI was excluded from the study. I would not recommend at this time.  She may need to be tested with her CPAP to make sure she is wearing it correctly.  I prescribed Ambien which has helped her in the past.

## 2014-07-06 NOTE — Telephone Encounter (Signed)
New Message        Pt calling stating she was seen today by Tarri Fuller and before she left she was given a sheet of paper that had a list of all of her medications and she states that everything on the sheet is incorrect. Pt wanting to speak with someone to make sure we have her correct medications in our system. Please call back and advise.

## 2014-07-06 NOTE — Patient Instructions (Addendum)
[place],  [id number]  RD  Referred to Dietician  Sent in prescription today for Ambien as needed for sleep  Your physician wants you to follow-up in: 6 months with Dr. Radford Pax. You will receive a reminder letter in the mail two months in advance. If you don't receive a letter, please call our office to schedule the follow-up appointment @ [phone].

## 2014-07-06 NOTE — Telephone Encounter (Signed)
Pt still have not received her Metoptolol. She had the wrong office.

## 2014-07-17 ENCOUNTER — Other Ambulatory Visit: Payer: Self-pay | Admitting: *Deleted

## 2014-07-17 DIAGNOSIS — C50919 Malignant neoplasm of unspecified site of unspecified female breast: Secondary | ICD-10-CM

## 2014-07-18 ENCOUNTER — Other Ambulatory Visit (HOSPITAL_BASED_OUTPATIENT_CLINIC_OR_DEPARTMENT_OTHER): Payer: Medicare PPO

## 2014-07-18 ENCOUNTER — Ambulatory Visit (HOSPITAL_BASED_OUTPATIENT_CLINIC_OR_DEPARTMENT_OTHER): Payer: Medicare PPO | Admitting: Oncology

## 2014-07-18 VITALS — BP 151/80 | HR 82 | Temp 98.6°F | Resp 18 | Ht 64.0 in | Wt 230.8 lb

## 2014-07-18 DIAGNOSIS — C50919 Malignant neoplasm of unspecified site of unspecified female breast: Secondary | ICD-10-CM

## 2014-07-18 DIAGNOSIS — C50912 Malignant neoplasm of unspecified site of left female breast: Secondary | ICD-10-CM

## 2014-07-18 LAB — CBC WITH DIFFERENTIAL/PLATELET
BASO%: 0.9 % (ref 0.0–2.0)
Basophils Absolute: 0.1 10*3/uL (ref 0.0–0.1)
EOS%: 1 % (ref 0.0–7.0)
Eosinophils Absolute: 0.1 10*3/uL (ref 0.0–0.5)
HCT: 38.3 % (ref 34.8–46.6)
HGB: 11.5 g/dL — ABNORMAL LOW (ref 11.6–15.9)
LYMPH%: 26 % (ref 14.0–49.7)
MCH: 18.6 pg — ABNORMAL LOW (ref 25.1–34.0)
MCHC: 30.1 g/dL — ABNORMAL LOW (ref 31.5–36.0)
MCV: 61.9 fL — ABNORMAL LOW (ref 79.5–101.0)
MONO#: 0.5 10*3/uL (ref 0.1–0.9)
MONO%: 6.4 % (ref 0.0–14.0)
NEUT#: 5.2 10*3/uL (ref 1.5–6.5)
NEUT%: 65.7 % (ref 38.4–76.8)
Platelets: 222 10*3/uL (ref 145–400)
RBC: 6.18 10*6/uL — ABNORMAL HIGH (ref 3.70–5.45)
RDW: 15.7 % — ABNORMAL HIGH (ref 11.2–14.5)
WBC: 8 10*3/uL (ref 3.9–10.3)
lymph#: 2.1 10*3/uL (ref 0.9–3.3)

## 2014-07-18 LAB — COMPREHENSIVE METABOLIC PANEL (CC13)
ALT: 38 U/L (ref 0–55)
AST: 22 U/L (ref 5–34)
Albumin: 4.1 g/dL (ref 3.5–5.0)
Alkaline Phosphatase: 105 U/L (ref 40–150)
Anion Gap: 10 mEq/L (ref 3–11)
BUN: 15.9 mg/dL (ref 7.0–26.0)
CO2: 27 mEq/L (ref 22–29)
Calcium: 10.3 mg/dL (ref 8.4–10.4)
Chloride: 106 mEq/L (ref 98–109)
Creatinine: 1 mg/dL (ref 0.6–1.1)
Glucose: 183 mg/dl — ABNORMAL HIGH (ref 70–140)
Potassium: 4.3 mEq/L (ref 3.5–5.1)
Sodium: 143 mEq/L (ref 136–145)
Total Bilirubin: 0.52 mg/dL (ref 0.20–1.20)
Total Protein: 7.4 g/dL (ref 6.4–8.3)

## 2014-07-18 NOTE — Progress Notes (Signed)
Hematology and Oncology Follow Up Visit  Jenna Mcdonald [id number] 08-Sep-1945 68 y.o. 07/22/2014 9:31 AM   Principal Diagnosis: Breast cancer, status post left lumpectomy and sentinel lymph node sampling for a pT1b pN0, stage IA invasive ductal carcinoma, grade 1, estrogen receptor 100% positive, progesterone receptor 99% positive, with an MIB-1 of 7% and no HER-2 amplification; unable to tolerate anti-estrogens because of arthralgias and myalgias.  Current treatment: Observation  Interim History:   Jenna Mcdonald returns today for follow-up of her breast cancer. She is establishing herself on my service today. She is being evaluated by neurosurgery because of a right mastoid effusion/CSF leakage, and she is scheduled for surgery before the end of the year at [place]. From a breast cancer point of view she is doing "fine" and hopes to graduate today.  Review of systems: She sleeps poorly, and has a history of sleep apnea. Occasionally she has blurred vision. 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 She has loose bowel movements perhaps twice a week. She has chronic back pain followed by neurosurgery locally. Her diabetes is under fair control. She exercises chiefly by walking. A detailed review of systems today was otherwise stable  Medications:  Current Outpatient Prescriptions on File Prior to Visit  Medication Sig Dispense Refill  . Calcium Carbonate-Vitamin D (CALCIUM 600 + D PO) Take 1 tablet by mouth daily.     Marland Kitchen diltiazem (CARDIZEM) 90 MG tablet Take 1 tablet (90 mg total) by mouth 2 (two) times daily. 60 tablet 6  . magnesium oxide (MAG-OX) 400 MG tablet Take 400 mg by mouth daily. Takes three times each week.    . METFORMIN HCL ER PO Take 500 mg by mouth daily.    . methocarbamol (ROBAXIN) 500 MG tablet Take 1 tablet (500 mg total) by mouth every 6 (six) hours as needed for muscle spasms. 60 tablet 1  . Multiple  Vitamin (MULITIVITAMIN WITH MINERALS) TABS Take 1 tablet by mouth daily.    . propafenone (RYTHMOL) 225 MG tablet 1 tablet as needed for breakthrough AFib 15 tablet 3  . warfarin (COUMADIN) 5 MG tablet Take 5-7.5 mg by mouth every evening. Take once a day.    . zolpidem (AMBIEN CR) 6.25 MG CR tablet Take 1 tablet (6.25 mg total) by mouth at bedtime as needed for sleep. 30 tablet 1  . [DISCONTINUED] flecainide (TAMBOCOR) 50 MG tablet Take 1 tablet (50 mg total) by mouth every 12 (twelve) hours. 60 tablet 12   No current facility-administered medications on file prior to visit.     Allergies:  Allergies  Allergen Reactions  . Aromasin [Exemestane]     Hair loss   . Femara [Letrozole]   . Gemfibrozil     Severe fatigue   . Letrozole Other (See Comments)    Unknown reactions   . Metformin And Related Diarrhea    500 MG AM and PM SEVERE Diarrhea  . Metoprolol Diarrhea    Fatigue   . Tamoxifen     Bone and body aches   . Tetracyclines & Related Other (See Comments)    Vaginal infections    . Toprol Xl [Metoprolol Tartrate]     Hair loss  . Vicodin [Hydrocodone-Acetaminophen] Nausea And Vomiting    Patient reports terrible nausea; plain tylenol is ok with patient      Physical Exam: Middle-aged white woman in no acute distress  Blood pressure 151/80,  pulse 82, temperature 98.6 F (37 C), temperature source Oral, resp. rate 18, height _0  (1.626 m), weight 230 lb 12.8 oz (104.69 kg). Wt Readings from Last 3 Encounters:  07/18/14 230 lb 12.8 oz (104.69 kg)  07/06/14 234 lb (106.142 kg)  01/24/14 236 lb (107.049 kg)   Sclerae unicteric, pupils equal and reactive Oropharynx clear and moist-- no thrush No cervical or supraclavicular adenopathy Lungs no rales or rhonchi Heart regular rate and rhythm Abd soft, obese, nontender, positive bowel sounds MSK no focal spinal tenderness, no upper extremity lymphedema Neuro: nonfocal, well oriented, appropriate affect Breasts:  The right breast is unremarkable. The left breast is status post lumpectomy and radiation. There is no evidence of local recurrence. The left axilla is benign.  Lab Results: CBC W/Diff    Component Value Date/Time   WBC 8.0 07/18/2014 1445   WBC 9.0 12/30/2013 1618   RBC 6.18* 07/18/2014 1445   RBC 5.89* 12/30/2013 1618   HGB 11.5* 07/18/2014 1445   HGB 11.1* 12/30/2013 1618   HCT 38.3 07/18/2014 1445   HCT 35.0* 12/30/2013 1618   PLT 222 07/18/2014 1445   PLT 262 12/30/2013 1618   MCV 61.9* 07/18/2014 1445   MCV 59.4* 12/30/2013 1618   MCH 18.6* 07/18/2014 1445   MCH 18.8* 12/30/2013 1618   MCHC 30.1* 07/18/2014 1445   MCHC 31.7 12/30/2013 1618   RDW 15.7* 07/18/2014 1445   RDW 17.7* 12/30/2013 1618   LYMPHSABS 2.1 07/18/2014 1445   LYMPHSABS 1.8 09/14/2013 0935   MONOABS 0.5 07/18/2014 1445   MONOABS 0.3 09/14/2013 0935   EOSABS 0.1 07/18/2014 1445   EOSABS 0.1 09/14/2013 0935   BASOSABS 0.1 07/18/2014 1445   BASOSABS 0.0 09/14/2013 0935     Chemistry      Component Value Date/Time   NA 143 07/18/2014 1446   NA 144 12/30/2013 1618   K 4.3 07/18/2014 1446   K 4.4 12/30/2013 1618   CL 106 12/30/2013 1618   CL 106 05/11/2012 1632   CO2 27 07/18/2014 1446   CO2 26 12/30/2013 1618   BUN 15.9 07/18/2014 1446   BUN 17 12/30/2013 1618   CREATININE 1.0 07/18/2014 1446   CREATININE 1.06 12/30/2013 1618   CREATININE 0.87 09/14/2013 0935      Component Value Date/Time   CALCIUM 10.3 07/18/2014 1446   CALCIUM 10.0 12/30/2013 1618   ALKPHOS 105 07/18/2014 1446   ALKPHOS 81 09/11/2011 2151   AST 22 07/18/2014 1446   AST 14 09/11/2011 2151   ALT 38 07/18/2014 1446   ALT 20 09/11/2011 2151   BILITOT 0.52 07/18/2014 1446   BILITOT 0.4 09/11/2011 2151       Radiological Studies:   CLINICAL DATA: Acute serous otitis media left ear. History breast cancer  EXAM: CT TEMPORAL BONES WITHOUT CONTRAST  TECHNIQUE: Axial and coronal plane CT imaging of the petrous  temporal bones was performed with thin-collimation image reconstruction. No intravenous contrast was administered. Multiplanar CT image reconstructions were also generated.  COMPARISON: CT head 09/11/2011  FINDINGS: Visualized intracranial contents are negative. Visualized paranasal sinuses are clear.  Right temporal bone: Right mastoid sinus well developed. The mastoid sinus is mostly clear with an air-fluid level in a posterior inferior medial air cell. Middle ear is well aerated and clear. Tympanic membrane non thickened. Ossicles normal. Inner ear structures are normal. Petrous apex is partially pneumatized and appears normal.  Left temporal bone: Large left mastoid sinus and middle ear effusion. Scattered air-fluid levels  in the mastoid sinus. No coalescence or bony destruction. Ossicles are surrounded by fluid but appear normal without erosion or displacement. Petrous apex is well pneumatized with scattered air-fluid levels. Inner ear structures are normal. No obstructing mass in the nasopharynx is identified.  IMPRESSION: Right mastoid sinus is clear aside from a solitary air-fluid level. Right middle ear is clear  Large left mastoid sinus effusion and middle ear effusion. Ossicles intact. There is fluid in the petrous apex. No bony coalescence or destruction. No underlying mass or cholesteatoma. No obstructing mass in the posterior nasopharynx.   Electronically Signed  By: Franchot Gallo M.D.  On: 06/13/2014 12:41      CLINICAL DATA: History of malignant lumpectomy of the left breast in 2010 followed by radiation therapy. Annual follow-up evaluation.  EXAM: DIGITAL DIAGNOSTIC bilateral MAMMOGRAM WITH CAD  COMPARISON: Previous examinations the most recent of which is dated 05/09/2013.  ACR Breast Density Category a: The breast tissue is almost entirely fatty.  FINDINGS: There are stable scarring changes located within the upper  outer quadrant of the left breast related to the patient's previous lumpectomy. There is no specific evidence for recurrent tumor or developing malignancy within either breast.  Mammographic images were processed with CAD.  IMPRESSION: Stable breast parenchymal pattern. No findings worrisome for recurrent tumor or developing malignancy.  RECOMMENDATION: Bilateral diagnostic mammography in 1 year.   Impression:  68 year old [place] woman #1. status post left lumpectomy and sentinel lymph node sampling for a pT1b pN0, stage IA invasive ductal carcinoma, grade 1, estrogen receptor 100% positive, progesterone receptor 99% positive, with an MIB-1 of 7% and no HER-2 amplification; unable to tolerate anti-estrogens because of arthralgias and myalgias.  #2. Thalassemia minor Hemoglobin stable at the time of 11 g.  #3. Atrial fibrillation   #4. Sleep apnea syndrome   #5. Type 2 diabetes   #6. 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 Unfortunately she was unable to tolerate anti-estrogens. However, even with local treatment alone, her risk of recurrence is low.  At this point I am comfortable releasing her to her primary care physician. She understands that we will be glad to see her at any point in the future if and when any question arises regarding recurrent breast cancer or the development of a new cancer. However we are making no further routine appointments for her here.  Jenna Mcdonald is very much in agreement with this plan, which was her expectation. She will need yearly mammography and a yearly physician breast exam for breast cancer follow-up.    CC: Patient Care Team: Josetta Huddle, MD as PCP - General (Internal Medicine)   Chauncey Cruel, MD 11/28/20159:31 AM

## 2014-07-19 ENCOUNTER — Other Ambulatory Visit: Payer: Self-pay | Admitting: Cardiology

## 2014-07-22 DIAGNOSIS — C50912 Malignant neoplasm of unspecified site of left female breast: Secondary | ICD-10-CM | POA: Insufficient documentation

## 2014-07-24 ENCOUNTER — Telehealth: Payer: Self-pay | Admitting: Cardiology

## 2014-07-24 ENCOUNTER — Ambulatory Visit: Payer: Medicare PPO | Admitting: Cardiology

## 2014-07-24 NOTE — Telephone Encounter (Signed)
New message     Talk to the nurse regarding BPAP machine

## 2014-07-24 NOTE — Telephone Encounter (Signed)
Pt st she has a problem with one of the bones in her L ear -- there is a hole that leaks spinal fluid (she will be getting surgery to repair this). Her ENT specialist st that sleep apnea worsens this, so she is to wear her VPAP every time she sleeps or takes a nap. Pt lives in a 2 level house. Her concern is that she really only goes upstairs to sleep, but takes multiple naps during the day -- making it impossible to have her VPAP with her every time she naps.  She st she currently uses a VPAP with inspiratory and expiratory settings, but would like another one to go downstairs for nap times.  Patient has the opportunity to purchase a CPAP. She is aware that they are different, but she cannot get another one from Medicare.  Pt wants to know if she can purchase and use this second machine or would it not work.   To Dr. Radford Pax for review and recommendations.

## 2014-07-25 NOTE — Telephone Encounter (Signed)
The CPAP would not work since she is on BiPAP

## 2014-07-26 NOTE — Addendum Note (Signed)
Addended by: Laureen Abrahams on: 07/26/2014 07:03 PM   Modules accepted: Orders, Medications

## 2014-07-26 NOTE — Telephone Encounter (Signed)
Informed patient a CPAP will not work for her since she is on BiPAP.  Patient appreciative for the call back.

## 2014-08-10 DIAGNOSIS — E119 Type 2 diabetes mellitus without complications: Secondary | ICD-10-CM | POA: Insufficient documentation

## 2014-10-09 ENCOUNTER — Encounter: Payer: Self-pay | Admitting: Cardiology

## 2014-10-17 DIAGNOSIS — E119 Type 2 diabetes mellitus without complications: Secondary | ICD-10-CM | POA: Diagnosis not present

## 2014-10-17 DIAGNOSIS — H35342 Macular cyst, hole, or pseudohole, left eye: Secondary | ICD-10-CM | POA: Diagnosis not present

## 2014-10-17 DIAGNOSIS — H35373 Puckering of macula, bilateral: Secondary | ICD-10-CM | POA: Diagnosis not present

## 2014-10-17 DIAGNOSIS — Z961 Presence of intraocular lens: Secondary | ICD-10-CM | POA: Diagnosis not present

## 2014-10-17 DIAGNOSIS — H10413 Chronic giant papillary conjunctivitis, bilateral: Secondary | ICD-10-CM | POA: Diagnosis not present

## 2014-10-25 DIAGNOSIS — H16101 Unspecified superficial keratitis, right eye: Secondary | ICD-10-CM | POA: Diagnosis not present

## 2014-10-30 ENCOUNTER — Telehealth: Payer: Self-pay | Admitting: Cardiology

## 2014-10-30 DIAGNOSIS — H169 Unspecified keratitis: Secondary | ICD-10-CM | POA: Diagnosis not present

## 2014-10-30 DIAGNOSIS — G4733 Obstructive sleep apnea (adult) (pediatric): Secondary | ICD-10-CM

## 2014-10-30 NOTE — Telephone Encounter (Signed)
New Message   Patient needs to speak to nurse in regards to her BPAP machine and its settings. She thinks that she needs it to be changed and it can only be changed per provider. Please give patient a call back.  Thanks

## 2014-10-31 NOTE — Telephone Encounter (Signed)
Please order a 2 week auto BiPAP titration from 4 to 20cm H2O

## 2014-10-31 NOTE — Telephone Encounter (Signed)
Pt stated that she is having issues with her BPAP blowing out too much pressure. She said that she is having trouble with her eyes staying so dry that she can barely see. She stated that settings are currently 12/8, and she wants to know if it is possible to lower the settings to see if that helps.   She has an appointment with [facility] December 01, 2014 to try a different nose piece to see if it will fit better, but she still wants to know if it's possible for [place] to let [facility] know to lower the settings.  I let her know that I was forwarding this message to Dr. Radford Pax, and that we would get back with her to let her know what can be done.

## 2014-10-31 NOTE — Telephone Encounter (Signed)
Orders have been placed for 2 week auto BiPAP titration from 4 to 20cm H2O - Message sent to [facility] with  [facility]

## 2014-11-28 ENCOUNTER — Telehealth: Payer: Self-pay | Admitting: Cardiology

## 2014-11-28 NOTE — Telephone Encounter (Signed)
Pt calling to get cpap machine test results from last week

## 2014-11-28 NOTE — Telephone Encounter (Signed)
Pt unhappy with how long it is taking to get her results from cpap readings and recommendations. Pt is going to [facility] this week and if the pressures are going to be adjusted they need to know for her app.  Pt was told that she will get a call tomorrow 11/29/14 by Dr Landis Gandy CMA Talbert Cage, pt agreed to plan.

## 2014-11-29 NOTE — Telephone Encounter (Signed)
Follow Up       Pt calling back to speak to [facility]. Please call back and advise.

## 2014-11-29 NOTE — Telephone Encounter (Signed)
I have spoken with Dr. Radford Pax and she does not have the results of the titration yet.  I have left a VM and staff message with [facility] to see if we can get that report so we can order the right settings by the time the patient is at [facility] on Friday 4/8 for a new machine.   I have also let the patient know what that we are waiting on results from [facility].

## 2014-11-30 NOTE — Telephone Encounter (Signed)
As of 4pm  On 4/7 we have not received results of the titration yet. Jenna Mcdonald has called concerned because she is supposed to have a meeting at 11:00 on 4/8 with [facility].   I have reached out to [facility] again for assistance in getting these results.

## 2014-12-05 ENCOUNTER — Telehealth: Payer: Self-pay | Admitting: Cardiology

## 2014-12-05 NOTE — Telephone Encounter (Signed)
Informed the patient that the download was received and being scanned in for Dr. Theodosia Blender review. Informed the patient I will call her with Dr. Theodosia Blender recommendations when she sends them to me.

## 2014-12-05 NOTE — Telephone Encounter (Signed)
New message         Pt would like to know if you received her cpap readings   please give pt a call

## 2014-12-07 DIAGNOSIS — H9012 Conductive hearing loss, unilateral, left ear, with unrestricted hearing on the contralateral side: Secondary | ICD-10-CM | POA: Diagnosis not present

## 2014-12-07 DIAGNOSIS — G4733 Obstructive sleep apnea (adult) (pediatric): Secondary | ICD-10-CM | POA: Diagnosis not present

## 2014-12-12 ENCOUNTER — Telehealth: Payer: Self-pay | Admitting: Cardiology

## 2014-12-12 NOTE — Telephone Encounter (Signed)
Download received. Will call patient tomorrow after Dr. Radford Pax reviews.

## 2014-12-12 NOTE — Telephone Encounter (Signed)
New message    Patient has been waiting for over 3 weeks she states for her results and would like a call back in regards to the result . Please call patient.

## 2014-12-18 NOTE — Telephone Encounter (Signed)
Patient concerned because it has been a month and she has not heard results of her at-home PAP titration. Apologized to patient and explained to her that we kept receiving recent downloads, not the actual titration report.  Patient extremely frustrated, stating she will bring all the times she has been inconvenienced to Dr. Radford Pax next week at her [place].  Again, apologized profusely to patient and informed her I will call her tomorrow with further instructions, that we do have the titration results but Dr. Radford Pax is not in the office.

## 2014-12-18 NOTE — Telephone Encounter (Signed)
New Message    Patient is calling back for the same issue. She would like to know her test results. Please give patient a call.

## 2014-12-19 ENCOUNTER — Other Ambulatory Visit: Payer: Self-pay

## 2014-12-19 ENCOUNTER — Encounter: Payer: Self-pay | Admitting: Cardiology

## 2014-12-19 ENCOUNTER — Telehealth: Payer: Self-pay

## 2014-12-19 DIAGNOSIS — G4733 Obstructive sleep apnea (adult) (pediatric): Secondary | ICD-10-CM

## 2014-12-19 NOTE — Telephone Encounter (Signed)
-----   Message from Sueanne Margarita, MD sent at 12/19/2014  3:16 PM EDT ----- See scaned report - change BiPAP to 11/7cm H2O and recheck d/l in 2 weeks

## 2014-12-19 NOTE — Telephone Encounter (Signed)
Informed patient of results and verbal understanding expressed.  [place] notified of Rx change.  Patient agrees with treatment plan.

## 2014-12-21 NOTE — Telephone Encounter (Signed)
See other phone note

## 2014-12-26 ENCOUNTER — Telehealth: Payer: Self-pay | Admitting: Pharmacist Clinician (PhC)/ Clinical Pharmacy Specialist

## 2014-12-26 NOTE — Telephone Encounter (Signed)
Request from [facility] to hold warfarin x 7 days for medial branch block injection.  Per protocol ok to hold.  CHADS2 score of 2 (HTN, DM).   Because it is spinal, agree to hold for 7 days instead of standard 5.   Form faxed back to [place].  Tried to contact patient, no answer or machine at home number

## 2014-12-27 DIAGNOSIS — H9190 Unspecified hearing loss, unspecified ear: Secondary | ICD-10-CM | POA: Diagnosis not present

## 2014-12-27 DIAGNOSIS — H905 Unspecified sensorineural hearing loss: Secondary | ICD-10-CM | POA: Diagnosis not present

## 2014-12-27 DIAGNOSIS — H9072 Mixed conductive and sensorineural hearing loss, unilateral, left ear, with unrestricted hearing on the contralateral side: Secondary | ICD-10-CM | POA: Diagnosis not present

## 2015-01-01 ENCOUNTER — Ambulatory Visit: Payer: Medicare PPO | Attending: Neurological Surgery | Admitting: Physical Therapy

## 2015-01-01 ENCOUNTER — Encounter: Payer: Self-pay | Admitting: Physical Therapy

## 2015-01-01 DIAGNOSIS — M545 Low back pain, unspecified: Secondary | ICD-10-CM

## 2015-01-01 NOTE — Therapy (Signed)
Jenna Mcdonald, [place], [address] Phone: [phone]   Fax:  [phone]  Physical Therapy Evaluation  Patient Details  Name: Jenna Mcdonald MRN: [id number] Date of Birth: 29-Jun-1946 Referring Provider:  Eustace Moore, MD  Encounter Date: 01/01/2015      PT End of Session - 01/01/15 0913    Visit Number 1   Authorization Type [employer]   PT Start Time [phone]   PT Stop Time 0930   PT Time Calculation (min) 67 min      Past Medical History  Diagnosis Date  . Diabetes mellitus   . A-fib   . Obesity   . Hypertension   . Irritable bowel   . Osteoarthritis   . Degenerative disc disease, lumbar     of the knees  . PONV (postoperative nausea and vomiting)   . Pneumonia   . Anemia   . Insomnia   . Thalassemia minor   . Empty sella syndrome   . Sleep apnea     bipap  . Gastroesophageal reflux disease     denies  . Breast CA     left  . Hypercholesteremia   . Vitamin D deficiency   . Low back pain   . Metabolic syndrome   . Osteopenia   . PAF (paroxysmal atrial fibrillation)   . Sigmoid diverticulosis   . Fatigue     Severe secondary to to gemfibrozil  . Hammertoe     left second toe    Past Surgical History  Procedure Laterality Date  . Replacement total knee Left 11  . Tonsillectomy    . Dilation and curettage of uterus    . Replacement total knee    . Tubal ligation    . Cystectomy  70    pilonidal   . Oophorectomy      wedge section not rem  . Breast surgery Left 10    lumpectomy  . Eye surgery Bilateral 12/14    cataracts  . Cesarean section    . Maximum access (mas)posterior lumbar interbody fusion (plif) 2 level N/A 09/22/2013    Procedure: FOR MAXIMUM ACCESS  POSTERIOR LUMBAR INTERBODY FUSION LUMBAR THREE-FOUR FOUR-FIVE;  Surgeon: Eustace Moore, MD;  Location: [facility];  Service: Neurosurgery;  Laterality: N/A;    There were no vitals filed for this  visit.  Visit Diagnosis:  Left-sided low back pain without sciatica - Plan: PT plan of care cert/re-cert      Subjective Assessment - 01/01/15 0835    Subjective Patient with lumbar fusion L3-29 August 2013.  Had some PT here and did well.  She reports that she had an eye and an ear surgery in December.  She reports that she is now having increased low back pain and feels very weak.   Limitations Sitting;Standing;House hold activities;Walking   How long can you sit comfortably? 1 hour   How long can you stand comfortably? 5 minutes   How long can you walk comfortably? 5 minutes   Diagnostic tests x-rays   Currently in Pain? Yes   Pain Score 8    Pain Location Back   Pain Orientation Lower;Left   Pain Descriptors / Indicators Sore;Sharp   Pain Type Chronic pain   Pain Radiating Towards denies   Pain Onset More than a month ago   Pain Frequency Constant   Aggravating Factors  any activity   Pain Relieving Factors rest and Tylenol,  reports pain is always there at best a 6-7/10   Effect of Pain on Daily Activities limits everything            [facility] PT Assessment - 01/01/15 0001    Assessment   Medical Diagnosis low back pain, debility   Onset Date 12/31/13   Prior Therapy after back surgery over a year ago   Precautions   Precautions None   Balance Screen   Has the patient fallen in the past 6 months No   Has the patient had a decrease in activity level because of a fear of falling?  No   Is the patient reluctant to leave their home because of a fear of falling?  No   Home Environment   Additional Comments does her own house and yardwork   Prior Function   Leisure some yardwork   ROM / Strength   AROM / PROM / Strength --  Lumbar ROM decreased 50% for side bending and exension   Strength   Overall Strength Comments Strength of upper and lower extremities 4-/5   Flexibility   Soft Tissue Assessment /Muscle Length --  tight Hs and piriformis mms   Palpation    Palpation she is very tight all over, spasms and tenderness, worse at left lumbar area and into the SI area   Special Tests    Special Tests --  negative SLR and slump tests                   [facility] Adult PT Treatment/Exercise - 01/01/15 0001    Lumbar Exercises: Aerobic   Elliptical NuStep L2 x 6 minutes with 2 rests   Lumbar Exercises: Machines for Strengthening   Other Lumbar Machine Exercise seated row and lats 15# 2x10   Other Lumbar Machine Exercise 5# oblique   Modalities   Modalities Moist Heat;Electrical Stimulation   Moist Heat Therapy   Number Minutes Moist Heat 15 Minutes   Moist Heat Location --  left low back   Electrical Stimulation   Electrical Stimulation Location left low back   Electrical Stimulation Parameters IFC   Electrical Stimulation Goals Pain                  PT Short Term Goals - 01/01/15 0916    PT SHORT TERM GOAL #1   Title independent with initial HEP   Time 2   Period Weeks   Status New           PT Long Term Goals - 01/01/15 2694    PT LONG TERM GOAL #1   Title report pain decreased 25%   Time 8   Period Weeks   Status New   PT LONG TERM GOAL #2   Title increase lumbar ROM 25%   Time 8   Period Weeks   Status New   PT LONG TERM GOAL #3   Title report tolerance to standing at 10 minutes to cook a meal   Baseline she currently reports 5 minute tolerance   Time 8   Period Weeks   Status New   PT LONG TERM GOAL #4   Title report that she can walk 10 minutes   Baseline currently reports that she cannot walk more than 5 minutes   Time 8   Period Weeks   Status New               Plan - 01/01/15 0914    Clinical Impression Statement Patient reports  that she is getting weak and unable to tolerate activity, she has low back pain rated an 8/10 at all times, had 3 level lumbar fusion in January 2015.  Has significant spasms and tightness int he lumbar area up to the neck.   Pt will benefit from skilled  therapeutic intervention in order to improve on the following deficits Decreased activity tolerance;Decreased endurance;Decreased range of motion;Decreased strength;Increased muscle spasms;Impaired flexibility;Pain   Rehab Potential Good   PT Frequency 2x / week   PT Duration 8 weeks   PT Treatment/Interventions Electrical Stimulation;Moist Heat;Therapeutic exercise;Therapeutic activities;Patient/family education;Manual techniques   PT Next Visit Plan slowly add gym activities   Consulted and Agree with Plan of Care Patient          G-Codes - Jan 24, 2015 0919    Functional Assessment Tool Used FOTO   Functional Limitation Other PT primary   Other PT Primary Current Status (R1540) At least 40 percent but less than 60 percent impaired, limited or restricted   Other PT Primary Goal Status (G8676) At least 40 percent but less than 60 percent impaired, limited or restricted       Problem List Patient Active Problem List   Diagnosis Date Noted  . Breast cancer, left breast 07/22/2014  . Obesity, Class III, BMI 40-49.9 (morbid obesity) 07/06/2014  . Chest pain 01/04/2014  . OSA (obstructive sleep apnea) 11/17/2013  . S/P lumbar spinal fusion 09/22/2013  . Paroxysmal atrial fibrillation 09/11/2011    Class: Acute  . Empty sella syndrome 09/11/2011    Class: Chronic  . Essential hypertension 09/11/2011  . Exogenous obesity 09/11/2011  . Gastroesophageal reflux disease 09/11/2011  . Thalassemia minor 09/11/2011    Class: Chronic  . Breast cancer 09/11/2011    Class: Chronic  . Degenerative joint disease 09/11/2011    Class: Chronic  . Irritable bowel disease 09/11/2011    Class: Chronic  . Lumbar disc disease 09/11/2011    Sumner Boast, PT 24-Jan-2015, 9:21 AM  [address] [place], [place], [address] Phone: [phone]   Fax:  [phone]

## 2015-01-04 ENCOUNTER — Ambulatory Visit (INDEPENDENT_AMBULATORY_CARE_PROVIDER_SITE_OTHER): Payer: Medicare PPO | Admitting: Cardiology

## 2015-01-04 ENCOUNTER — Encounter: Payer: Self-pay | Admitting: Cardiology

## 2015-01-04 VITALS — BP 130/60 | HR 78 | Ht 65.0 in | Wt 239.8 lb

## 2015-01-04 DIAGNOSIS — G4733 Obstructive sleep apnea (adult) (pediatric): Secondary | ICD-10-CM

## 2015-01-04 DIAGNOSIS — I480 Paroxysmal atrial fibrillation: Secondary | ICD-10-CM

## 2015-01-04 DIAGNOSIS — R011 Cardiac murmur, unspecified: Secondary | ICD-10-CM

## 2015-01-04 DIAGNOSIS — I10 Essential (primary) hypertension: Secondary | ICD-10-CM

## 2015-01-04 NOTE — Patient Instructions (Signed)
Medication Instructions:  Your physician recommends that you continue on your current medications as directed. Please refer to the Current Medication list given to you today.   Labwork: None  Testing/Procedures: Your physician has requested that you have an echocardiogram. Echocardiography is a painless test that uses sound waves to create images of your heart. It provides your doctor with information about the size and shape of your heart and how well your heart's chambers and valves are working. This procedure takes approximately one hour. There are no restrictions for this procedure.  Follow-Up: Your physician wants you to follow-up in: 6 months with Dr. Turner. You will receive a reminder letter in the mail two months in advance. If you don't receive a letter, please call our office to schedule the follow-up appointment.   Any Other Special Instructions Will Be Listed Below (If Applicable).   

## 2015-01-04 NOTE — Progress Notes (Signed)
Cardiology Office Note   Date:  01/04/2015   ID:  Jenna Mcdonald, DOB 1946-02-13, MRN [id number]  PCP:  Henrine Screws, MD    Chief Complaint  Patient presents with  . Atrial Fibrillation  . Sleep Apnea      History of Present Illness: Jenna Mcdonald is a 69y.o. female with a history of OSA on BiPAP, HTN, PAF and morbid obesity who presents today for followup. The last time I saw her she had some chest pain and a Lexiscan myoview was normal.  She is doing well with her BiPAP device.  She tolerates the nasal pillow mask and feels the pressure is adequate. She feels rested in the am and has no daytime sleepiness.  Her download today showed an AHI of 2.2 per hour on BiPAP 11/7cm H2O and she is 100% compliant in using more than 4 hours nightly.  She occasionally has some breakthrough of PAF but resolves on its own.  She has not had to take any Rhythmol.   She has not had any breakthrough in the past month.  She denies any chest pain.  She has chronic DOE which is stable.  She has been gaining weight.  She is fairly sedentary due to chronic back.  She says that everyday around 1-2pm she gets fatigued and lightheaded.  Her BS was 70 one day when this occurred.   Past Medical History  Diagnosis Date  . Diabetes mellitus   . A-fib   . Obesity   . Hypertension   . Irritable bowel   . Osteoarthritis   . Degenerative disc disease, lumbar     of the knees  . PONV (postoperative nausea and vomiting)   . Pneumonia   . Anemia   . Insomnia   . Thalassemia minor   . Empty sella syndrome   . Sleep apnea     bipap  . Gastroesophageal reflux disease     denies  . Breast CA     left  . Hypercholesteremia   . Vitamin D deficiency   . Low back pain   . Metabolic syndrome   . Osteopenia   . PAF (paroxysmal atrial fibrillation)   . Sigmoid diverticulosis   . Fatigue     Severe secondary to to gemfibrozil  . Hammertoe     left second toe    Past Surgical History    Procedure Laterality Date  . Replacement total knee Left 11  . Tonsillectomy    . Dilation and curettage of uterus    . Replacement total knee    . Tubal ligation    . Cystectomy  70    pilonidal   . Oophorectomy      wedge section not rem  . Breast surgery Left 10    lumpectomy  . Eye surgery Bilateral 12/14    cataracts  . Cesarean section    . Maximum access (mas)posterior lumbar interbody fusion (plif) 2 level N/A 09/22/2013    Procedure: FOR MAXIMUM ACCESS  POSTERIOR LUMBAR INTERBODY FUSION LUMBAR THREE-FOUR FOUR-FIVE;  Surgeon: Eustace Moore, MD;  Location: [facility];  Service: Neurosurgery;  Laterality: N/A;     Current Outpatient Prescriptions  Medication Sig Dispense Refill  . Calcium Carbonate-Vitamin D (CALCIUM 600 + D PO) Take 1 tablet by mouth daily.     Marland Kitchen diltiazem (CARDIZEM) 90 MG tablet TAKE 1 TABLET (90 MG TOTAL) BY MOUTH 3 (THREE) TIMES DAILY. 270 tablet 1  . glimepiride (AMARYL)  2 MG tablet Take 2 mg by mouth daily with breakfast.    . magnesium oxide (MAG-OX) 400 MG tablet Take 400 mg by mouth daily. Takes three times each week.    . metFORMIN (GLUCOPHAGE-XR) 500 MG 24 hr tablet Take 1 tablet by mouth 2 (two) times daily.  3  . methocarbamol (ROBAXIN) 500 MG tablet Take 1 tablet (500 mg total) by mouth every 6 (six) hours as needed for muscle spasms. 60 tablet 1  . Multiple Vitamin (MULITIVITAMIN WITH MINERALS) TABS Take 1 tablet by mouth daily.    . propafenone (RYTHMOL) 225 MG tablet 1 tablet as needed for breakthrough AFib (Patient taking differently: Take 1 tablet by mouth daily as needed for breakthrough AFib) 15 tablet 3  . warfarin (COUMADIN) 5 MG tablet Take 5-7.5 mg by mouth every evening. ON HOLD  Take once a day.    . [DISCONTINUED] flecainide (TAMBOCOR) 50 MG tablet Take 1 tablet (50 mg total) by mouth every 12 (twelve) hours. 60 tablet 12   No current facility-administered medications for this visit.    Allergies:   Aromasin; Femara;  Gemfibrozil; Letrozole; Metformin and related; Metoprolol; Tamoxifen; Tetracyclines & related; Toprol xl; and Vicodin    Social History:  The patient  reports that she quit smoking about 11 years ago. Her smoking use included Cigarettes. She has a 5 pack-year smoking history. She does not have any smokeless tobacco history on file. She reports that she does not drink alcohol or use illicit drugs.   Family History:  The patient's family history includes Anesthesia problems in her mother; Diabetes in her father and mother.    ROS:  Please see the history of present illness.   Otherwise, review of systems are positive for none.   All other systems are reviewed and negative.    PHYSICAL EXAM: VS:  BP 130/60 mmHg  Pulse 78  Ht 5\' 5"  (1.651 m)  Wt 239 lb 12.8 oz (108.773 kg)  BMI 39.90 kg/m2  SpO2 95% , BMI Body mass index is 39.9 kg/(m^2). GEN: Well nourished, well developed, in no acute distress HEENT: normal Neck: no JVD, carotid bruits, or masses Cardiac: RRR; no rubs, or gallops,no edema.  1/6 SM at RUSB Respiratory:  clear to auscultation bilaterally, normal work of breathing GI: soft, nontender, nondistended, + BS MS: no deformity or atrophy Skin: warm and dry, no rash Neuro:  Strength and sensation are intact Psych: euthymic mood, full affect   EKG:  EKG was not ordered today.    Recent Labs: 07/18/2014: ALT 38; BUN 15.9; Creatinine 1.0; Hemoglobin 11.5*; Platelets 222; Potassium 4.3; Sodium 143    Lipid Panel    Component Value Date/Time   CHOL  05/14/2008 0435    146        ATP III CLASSIFICATION:  <200     mg/dL   Desirable  200-239  mg/dL   Borderline High  >=240    mg/dL   High   TRIG 105 05/14/2008 0435   HDL 27* 05/14/2008 0435   CHOLHDL 5.4 05/14/2008 0435   VLDL 21 05/14/2008 0435   LDLCALC  05/14/2008 0435    98        Total Cholesterol/HDL:CHD Risk Coronary Heart Disease Risk Table                     Men   Women  1/2 Average Risk   3.4   3.3        Wt Readings  from Last 3 Encounters:  01/04/15 239 lb 12.8 oz (108.773 kg)  07/18/14 230 lb 12.8 oz (104.69 kg)  07/06/14 234 lb (106.142 kg)    ASSESSMENT AND PLAN:  1. HTN - well controlled - continue Cardizem  2. PAF maintaining - now back in NSR - continue Cardizem/Warfarin  - PRN Rhythmol 3.  OSA on BiPAP and doing well.  Continue on current therapy.   4.  Obesity 5. Chronic systemic anticoagulation 6. Chest pain - atypical and resolved - stress test showed no ischemia. 7.  Heart murmur - check 2D echo   Current medicines are reviewed at length with the patient today.  The patient does not have concerns regarding medicines.  The following changes have been made:  no change  Labs/ tests ordered today include: see above assessment and plan No orders of the defined types were placed in this encounter.     Disposition:   FU with me in 6 months   Signed, Sueanne Margarita, MD  01/04/2015 9:05 AM    [facility] [address], [place], [place]  [address] Phone: [phone]; Fax: [phone]

## 2015-01-08 ENCOUNTER — Encounter: Payer: Self-pay | Admitting: Cardiology

## 2015-01-08 DIAGNOSIS — M545 Low back pain: Secondary | ICD-10-CM | POA: Diagnosis not present

## 2015-01-08 DIAGNOSIS — M4307 Spondylolysis, lumbosacral region: Secondary | ICD-10-CM | POA: Diagnosis not present

## 2015-01-09 ENCOUNTER — Ambulatory Visit: Payer: Medicare PPO | Admitting: Physical Therapy

## 2015-01-09 ENCOUNTER — Encounter: Payer: Self-pay | Admitting: Physical Therapy

## 2015-01-09 DIAGNOSIS — M545 Low back pain, unspecified: Secondary | ICD-10-CM

## 2015-01-09 NOTE — Therapy (Signed)
[address] [place], [place], [address] Phone: [phone]   Fax:  [phone]  Physical Therapy Treatment  Patient Details  Name: Jenna Mcdonald MRN: [id number] Date of Birth: October 26, 1945 Referring Provider:  Elberta Spaniel, MD  Encounter Date: 01/09/2015      PT End of Session - 01/09/15 0911    Visit Number 2   Authorization Type [employer]   PT Start Time 0845   PT Stop Time 3329   PT Time Calculation (min) 62 min      Past Medical History  Diagnosis Date  . Diabetes mellitus   . A-fib   . Obesity   . Hypertension   . Irritable bowel   . Osteoarthritis   . Degenerative disc disease, lumbar     of the knees  . PONV (postoperative nausea and vomiting)   . Pneumonia   . Anemia   . Insomnia   . Thalassemia minor   . Empty sella syndrome   . Sleep apnea     bipap  . Gastroesophageal reflux disease     denies  . Breast CA     left  . Hypercholesteremia   . Vitamin D deficiency   . Low back pain   . Metabolic syndrome   . Osteopenia   . PAF (paroxysmal atrial fibrillation)   . Sigmoid diverticulosis   . Fatigue     Severe secondary to to gemfibrozil  . Hammertoe     left second toe    Past Surgical History  Procedure Laterality Date  . Replacement total knee Left 11  . Tonsillectomy    . Dilation and curettage of uterus    . Replacement total knee    . Tubal ligation    . Cystectomy  70    pilonidal   . Oophorectomy      wedge section not rem  . Breast surgery Left 10    lumpectomy  . Eye surgery Bilateral 12/14    cataracts  . Cesarean section    . Maximum access (mas)posterior lumbar interbody fusion (plif) 2 level N/A 09/22/2013    Procedure: FOR MAXIMUM ACCESS  POSTERIOR LUMBAR INTERBODY FUSION LUMBAR THREE-FOUR FOUR-FIVE;  Surgeon: Eustace Moore, MD;  Location: [facility];  Service: Neurosurgery;  Laterality: N/A;    There were no vitals filed for this  visit.  Visit Diagnosis:  Left-sided low back pain without sciatica      Subjective Assessment - 01/09/15 0843    Subjective got injection in back yesterday so feeling much better   Currently in Pain? Yes   Pain Score 5    Pain Location Back   Pain Orientation Left;Lower   Pain Descriptors / Indicators Aching                         [facility] Adult PT Treatment/Exercise - 01/09/15 0001    Lumbar Exercises: Aerobic   Elliptical NuStep L4 x 6 minutes    Lumbar Exercises: Machines for Strengthening   Cybex Lumbar Extension 2 sets 10 blue tband  1 set seated 1 set standing   Cybex Knee Extension 5# 2 sets 10   Cybex Knee Flexion 15# 2 sets 10   Other Lumbar Machine Exercise seated row and lats 15# 2x10  blue scap stab 10 times 3 way   Other Lumbar Machine Exercise 5# oblique  15 times each   Modalities  Modalities Moist Heat;Electrical Stimulation   Moist Heat Therapy   Number Minutes Moist Heat 15 Minutes   Moist Heat Location Lumbar Spine   Electrical Stimulation   Electrical Stimulation Location left low back   Electrical Stimulation Parameters IFC   Electrical Stimulation Goals Pain                PT Education - 01/09/15 0909    Education provided Yes   Education Details blue tband lumbar ext, rotation, shld ext and retraction, bicep curl   Person(s) Educated Patient   Methods Explanation;Demonstration;Handout;Verbal cues   Comprehension Verbalized understanding;Returned demonstration          PT Short Term Goals - 01/09/15 0923    PT SHORT TERM GOAL #1   Title independent with initial HEP   Status Achieved           PT Long Term Goals - 01/01/15 0917    PT LONG TERM GOAL #1   Title report pain decreased 25%   Time 8   Period Weeks   Status New   PT LONG TERM GOAL #2   Title increase lumbar ROM 25%   Time 8   Period Weeks   Status New   PT LONG TERM GOAL #3   Title report tolerance to standing at 10 minutes to cook a meal    Baseline she currently reports 5 minute tolerance   Time 8   Period Weeks   Status New   PT LONG TERM GOAL #4   Title report that she can walk 10 minutes   Baseline currently reports that she cannot walk more than 5 minutes   Time 8   Period Weeks   Status New               Plan - 01/09/15 8546    Clinical Impression Statement pt tolerated treatment well, pt very eager to get stronger and get moving, instructed pt in slow progression to avoid injury or increased pain. Issued HEP and pt demo well with Handout.   PT Next Visit Plan slowly add gym activities, check HEP, instruct in TENS use if pt brings in her TENS        Problem List Patient Active Problem List   Diagnosis Date Noted  . Breast cancer, left breast 07/22/2014  . Obesity, Class III, BMI 40-49.9 (morbid obesity) 07/06/2014  . OSA (obstructive sleep apnea) 11/17/2013  . S/P lumbar spinal fusion 09/22/2013  . Paroxysmal atrial fibrillation 09/11/2011    Class: Acute  . Empty sella syndrome 09/11/2011    Class: Chronic  . Essential hypertension 09/11/2011  . Exogenous obesity 09/11/2011  . Gastroesophageal reflux disease 09/11/2011  . Thalassemia minor 09/11/2011    Class: Chronic  . Breast cancer 09/11/2011    Class: Chronic  . Degenerative joint disease 09/11/2011    Class: Chronic  . Irritable bowel disease 09/11/2011    Class: Chronic  . Lumbar disc disease 09/11/2011    PAYSEUR,ANGIE PTA 01/09/2015, 9:24 AM  [address] [place], [place], [address] Phone: [phone]   Fax:  [phone]

## 2015-01-11 ENCOUNTER — Encounter: Payer: Self-pay | Admitting: Physical Therapy

## 2015-01-11 ENCOUNTER — Ambulatory Visit: Payer: Medicare PPO | Admitting: Physical Therapy

## 2015-01-11 ENCOUNTER — Other Ambulatory Visit: Payer: Self-pay | Admitting: Cardiology

## 2015-01-11 DIAGNOSIS — M545 Low back pain, unspecified: Secondary | ICD-10-CM

## 2015-01-11 NOTE — Therapy (Signed)
[place] [place], [place], [address] Phone: [phone]   Fax:  [phone]  Physical Therapy Treatment  Patient Details  Name: Jenna Mcdonald MRN: [id number] Date of Birth: [birth year] Referring Provider:  Eustace Moore, MD  Encounter Date: 01/11/2015      PT End of Session - 01/11/15 0923    Visit Number 3   Number of Visits 6   Authorization Type [employer]   PT Start Time 0845   PT Stop Time 0945   PT Time Calculation (min) 60 min      Past Medical History  Diagnosis Date  . Diabetes mellitus   . A-fib   . Obesity   . Hypertension   . Irritable bowel   . Osteoarthritis   . Degenerative disc disease, lumbar     of the knees  . PONV (postoperative nausea and vomiting)   . Pneumonia   . Anemia   . Insomnia   . Thalassemia minor   . Empty sella syndrome   . Sleep apnea     bipap  . Gastroesophageal reflux disease     denies  . Breast CA     left  . Hypercholesteremia   . Vitamin D deficiency   . Low back pain   . Metabolic syndrome   . Osteopenia   . PAF (paroxysmal atrial fibrillation)   . Sigmoid diverticulosis   . Fatigue     Severe secondary to to gemfibrozil  . Hammertoe     left second toe    Past Surgical History  Procedure Laterality Date  . Replacement total knee Left 11  . Tonsillectomy    . Dilation and curettage of uterus    . Replacement total knee    . Tubal ligation    . Cystectomy  70    pilonidal   . Oophorectomy      wedge section not rem  . Breast surgery Left 10    lumpectomy  . Eye surgery Bilateral 12/14    cataracts  . Cesarean section    . Maximum access (mas)posterior lumbar interbody fusion (plif) 2 level N/A 09/22/2013    Procedure: FOR MAXIMUM ACCESS  POSTERIOR LUMBAR INTERBODY FUSION LUMBAR THREE-FOUR FOUR-FIVE;  Surgeon: Eustace Moore, MD;  Location: [facility];  Service: Neurosurgery;  Laterality: N/A;    There were no vitals filed  for this visit.  Visit Diagnosis:  Left-sided low back pain without sciatica      Subjective Assessment - 01/11/15 0836    Subjective HEP going well, shot not as great, forgot TENS unit. PAIN  "I can just feel it" however pt rates very high   Currently in Pain? Yes   Pain Score 7    Pain Location Back   Pain Orientation Left;Lower   Pain Descriptors / Indicators Aching                         [facility] Adult PT Treatment/Exercise - 01/11/15 0001    Exercises   Exercises Knee/Hip   Lumbar Exercises: Aerobic   Elliptical NuStep L4 x 6 minutes    Lumbar Exercises: Machines for Strengthening   Cybex Lumbar Extension lumbar flexion with ball squeeze, lumbar obl with weight ball   Other Lumbar Machine Exercise seated row and lats 15# 2x10  blue scap stab 10 times 3 way   Other Lumbar Machine Exercise sit fit pelvic ROM 15  reps each, sit fit LAQ, marching and hip abd 10 times each 3 #   Knee/Hip Exercises: Standing   Other Standing Knee Exercises red tband hip ext and abd 1 sets 10 bilaterally  with pain and hip drop on Left with SLS   Other Standing Knee Exercises standing at wall UE OH ext with opp hip ext 10 times each   Moist Heat Therapy   Number Minutes Moist Heat 15 Minutes   Moist Heat Location Lumbar Spine   Electrical Stimulation   Electrical Stimulation Location left low back   Electrical Stimulation Parameters IFC   Electrical Stimulation Goals Pain                  PT Short Term Goals - 01/09/15 9604    PT SHORT TERM GOAL #1   Title independent with initial HEP   Status Achieved           PT Long Term Goals - 01/11/15 5409    PT LONG TERM GOAL #1   Title report pain decreased 25%   Status On-going   PT LONG TERM GOAL #2   Title increase lumbar ROM 25%   Status On-going   PT LONG TERM GOAL #3   Title report tolerance to standing at 10 minutes to cook a meal   Status On-going   PT LONG TERM GOAL #4   Title report that she can walk  10 minutes   Status On-going               Plan - 01/11/15 8119    Clinical Impression Statement pt with some increased pain in LB with ther ex today, increased VCing needed for correct BM and posture with ther ex.   PT Next Visit Plan continue with core  and hip strength, pt worried about visit limit wanting ot get stronger to get back to [facility]        Problem List Patient Active Problem List   Diagnosis Date Noted  . Breast cancer, left breast 07/22/2014  . Obesity, Class III, BMI 40-49.9 (morbid obesity) 07/06/2014  . OSA (obstructive sleep apnea) 11/17/2013  . S/P lumbar spinal fusion 09/22/2013  . Paroxysmal atrial fibrillation 09/11/2011    Class: Acute  . Empty sella syndrome 09/11/2011    Class: Chronic  . Essential hypertension 09/11/2011  . Exogenous obesity 09/11/2011  . Gastroesophageal reflux disease 09/11/2011  . Thalassemia minor 09/11/2011    Class: Chronic  . Breast cancer 09/11/2011    Class: Chronic  . Degenerative joint disease 09/11/2011    Class: Chronic  . Irritable bowel disease 09/11/2011    Class: Chronic  . Lumbar disc disease 09/11/2011    Jayvion Stefanski,ANGIE  PTA   01/11/2015, 9:25 AM  [address] [place], [place], [address] Phone: [phone]   Fax:  [phone]

## 2015-01-12 ENCOUNTER — Ambulatory Visit (HOSPITAL_COMMUNITY): Payer: Medicare PPO | Attending: Cardiology

## 2015-01-12 ENCOUNTER — Other Ambulatory Visit: Payer: Self-pay

## 2015-01-12 DIAGNOSIS — R011 Cardiac murmur, unspecified: Secondary | ICD-10-CM | POA: Diagnosis not present

## 2015-01-12 DIAGNOSIS — I10 Essential (primary) hypertension: Secondary | ICD-10-CM | POA: Insufficient documentation

## 2015-01-15 DIAGNOSIS — M545 Low back pain: Secondary | ICD-10-CM | POA: Diagnosis not present

## 2015-01-15 DIAGNOSIS — I10 Essential (primary) hypertension: Secondary | ICD-10-CM | POA: Diagnosis not present

## 2015-01-15 DIAGNOSIS — Z6841 Body Mass Index (BMI) 40.0 and over, adult: Secondary | ICD-10-CM | POA: Diagnosis not present

## 2015-01-18 ENCOUNTER — Ambulatory Visit: Payer: Medicare PPO | Admitting: Physical Therapy

## 2015-01-18 ENCOUNTER — Encounter: Payer: Self-pay | Admitting: Physical Therapy

## 2015-01-18 ENCOUNTER — Encounter: Payer: Self-pay | Admitting: Cardiology

## 2015-01-18 DIAGNOSIS — M545 Low back pain, unspecified: Secondary | ICD-10-CM

## 2015-01-18 NOTE — Therapy (Signed)
[place], [place], [address] Phone: [phone]   Fax:  [phone]  Physical Therapy Treatment  Patient Details  Name: MAYUMI SUMMERSON MRN: [id number] Date of Birth: 1945-09-28 Referring Provider:  Eustace Moore, MD  Encounter Date: 01/18/2015      PT End of Session - 01/18/15 0932    Visit Number 4   Number of Visits 6   Authorization Type [employer]   PT Start Time 8101   PT Stop Time 0950   PT Time Calculation (min) 68 min      Past Medical History  Diagnosis Date  . Diabetes mellitus   . A-fib   . Obesity   . Hypertension   . Irritable bowel   . Osteoarthritis   . Degenerative disc disease, lumbar     of the knees  . PONV (postoperative nausea and vomiting)   . Pneumonia   . Anemia   . Insomnia   . Thalassemia minor   . Empty sella syndrome   . Sleep apnea     bipap  . Gastroesophageal reflux disease     denies  . Breast CA     left  . Hypercholesteremia   . Vitamin D deficiency   . Low back pain   . Metabolic syndrome   . Osteopenia   . PAF (paroxysmal atrial fibrillation)   . Sigmoid diverticulosis   . Fatigue     Severe secondary to to gemfibrozil  . Hammertoe     left second toe    Past Surgical History  Procedure Laterality Date  . Replacement total knee Left 11  . Tonsillectomy    . Dilation and curettage of uterus    . Replacement total knee    . Tubal ligation    . Cystectomy  70    pilonidal   . Oophorectomy      wedge section not rem  . Breast surgery Left 10    lumpectomy  . Eye surgery Bilateral 12/14    cataracts  . Cesarean section    . Maximum access (mas)posterior lumbar interbody fusion (plif) 2 level N/A 09/22/2013    Procedure: FOR MAXIMUM ACCESS  POSTERIOR LUMBAR INTERBODY FUSION LUMBAR THREE-FOUR FOUR-FIVE;  Surgeon: Eustace Moore, MD;  Location: [facility];  Service: Neurosurgery;  Laterality: N/A;    There were no vitals filed  for this visit.  Visit Diagnosis:  Left-sided low back pain without sciatica      Subjective Assessment - 01/18/15 0843    Subjective brought in TENS unit for help, not doing HEP like I should, 40% better from shot, just so weak and fatigue easy. LE cramps   How long can you stand comfortably? 10 min   How long can you walk comfortably? 5-10 min   Pain Score 6    Pain Location Back   Pain Orientation Left   Pain Descriptors / Indicators Aching            [facility] PT Assessment - 01/18/15 0001    ROM / Strength   AROM / PROM / Strength AROM  standing limited 25-50%                      [facility] Adult PT Treatment/Exercise - 01/18/15 0001    Lumbar Exercises: Machines for Strengthening   Cybex Lumbar Extension rotation with wt ball 10 times, ext with weight ball 10 times  20#  pulley ext 10 X   Cybex Knee Extension --   Other Lumbar Machine Exercise seated row and lats 20# 2x10  lat pull 20# 2 x10   Other Lumbar Machine Exercise pulley 10 # 10 time rotation   Knee/Hip Exercises: Aerobic   Stationary Bike Nustep L5 7 min   Knee/Hip Exercises: Machines for Strengthening   Cybex Knee Extension 5# 3 sets 10   Cybex Knee Flexion 15# 3 sets 10   Knee/Hip Exercises: Seated   Other Seated Knee Exercises hip add with ball squeeze 20 times   Other Seated Knee Exercises hip abd red tband 2 sets 10   Moist Heat Therapy   Number Minutes Moist Heat 15 Minutes   Moist Heat Location Lumbar Spine   Electrical Stimulation   Electrical Stimulation Location left low back   Electrical Stimulation Parameters TENS  instructed in TENS use, application and use. Set pt machine                PT Education - 01/18/15 0932    Education provided Yes   Education Details TES use, application, safety and parameters set   Person(s) Educated Patient   Methods Explanation;Demonstration   Comprehension Returned demonstration;Verbalized understanding          PT Short Term Goals  - 01/18/15 0903    PT SHORT TERM GOAL #1   Title independent with initial HEP   Status Achieved           PT Long Term Goals - 01/18/15 0903    PT LONG TERM GOAL #1   Title report pain decreased 25%   Status Achieved   PT LONG TERM GOAL #2   Title increase lumbar ROM 25%   Status On-going   PT LONG TERM GOAL #3   Title report tolerance to standing at 10 minutes to cook a meal   Status On-going   PT LONG TERM GOAL #4   Title report that she can walk 10 minutes   Status On-going               Plan - 01/18/15 0936    Clinical Impression Statement pt with improved pain from shot, but chief complaint is weakness and fatigue with minimal activity. pt is not doing HEP as needed and encouraged to do so. educated in personal TENS use   PT Next Visit Plan core/hip strength        Problem List Patient Active Problem List   Diagnosis Date Noted  . Breast cancer, left breast 07/22/2014  . Obesity, Class III, BMI 40-49.9 (morbid obesity) 07/06/2014  . OSA (obstructive sleep apnea) 11/17/2013  . S/P lumbar spinal fusion 09/22/2013  . Paroxysmal atrial fibrillation 09/11/2011    Class: Acute  . Empty sella syndrome 09/11/2011    Class: Chronic  . Essential hypertension 09/11/2011  . Exogenous obesity 09/11/2011  . Gastroesophageal reflux disease 09/11/2011  . Thalassemia minor 09/11/2011    Class: Chronic  . Breast cancer 09/11/2011    Class: Chronic  . Degenerative joint disease 09/11/2011    Class: Chronic  . Irritable bowel disease 09/11/2011    Class: Chronic  . Lumbar disc disease 09/11/2011    Janan Bogie,ANGIE PTA 01/18/2015, 9:37 AM  [address] [place], [place], [address] Phone: [phone]   Fax:  [phone]

## 2015-01-24 ENCOUNTER — Ambulatory Visit: Payer: Medicare PPO | Attending: Neurological Surgery | Admitting: Physical Therapy

## 2015-01-24 ENCOUNTER — Encounter: Payer: Self-pay | Admitting: Physical Therapy

## 2015-01-24 DIAGNOSIS — M545 Low back pain, unspecified: Secondary | ICD-10-CM

## 2015-01-24 NOTE — Therapy (Signed)
[place], [place], [address] Phone: [phone]   Fax:  [phone]  Physical Therapy Treatment  Patient Details  Name: Jenna Mcdonald MRN: [id number] Date of Birth: 13-Nov-1945 Referring Provider:  Eustace Moore, MD  Encounter Date: 01/24/2015      PT End of Session - 01/24/15 0926    Visit Number 5   Authorization Type [employer]   PT Start Time 0840   PT Stop Time 9381   PT Time Calculation (min) 62 min   Activity Tolerance Patient limited by pain      Past Medical History  Diagnosis Date  . Diabetes mellitus   . A-fib   . Obesity   . Hypertension   . Irritable bowel   . Osteoarthritis   . Degenerative disc disease, lumbar     of the knees  . PONV (postoperative nausea and vomiting)   . Pneumonia   . Anemia   . Insomnia   . Thalassemia minor   . Empty sella syndrome   . Sleep apnea     bipap  . Gastroesophageal reflux disease     denies  . Breast CA     left  . Hypercholesteremia   . Vitamin D deficiency   . Low back pain   . Metabolic syndrome   . Osteopenia   . PAF (paroxysmal atrial fibrillation)   . Sigmoid diverticulosis   . Fatigue     Severe secondary to to gemfibrozil  . Hammertoe     left second toe    Past Surgical History  Procedure Laterality Date  . Replacement total knee Left 11  . Tonsillectomy    . Dilation and curettage of uterus    . Replacement total knee    . Tubal ligation    . Cystectomy  70    pilonidal   . Oophorectomy      wedge section not rem  . Breast surgery Left 10    lumpectomy  . Eye surgery Bilateral 12/14    cataracts  . Cesarean section    . Maximum access (mas)posterior lumbar interbody fusion (plif) 2 level N/A 09/22/2013    Procedure: FOR MAXIMUM ACCESS  POSTERIOR LUMBAR INTERBODY FUSION LUMBAR THREE-FOUR FOUR-FIVE;  Surgeon: Eustace Moore, MD;  Location: [facility];  Service: Neurosurgery;  Laterality: N/A;     There were no vitals filed for this visit.  Visit Diagnosis:  Left-sided low back pain without sciatica      Subjective Assessment - 01/24/15 0845    Subjective Seems like the injection is wearing off, I am hurting again in the left low back   Currently in Pain? Yes   Pain Score 8    Pain Location Back   Pain Orientation Left   Pain Descriptors / Indicators Aching   Pain Type Chronic pain                         [facility] Adult PT Treatment/Exercise - 01/24/15 0001    Lumbar Exercises: Aerobic   Elliptical NuStep L4 x 6 minutes    Lumbar Exercises: Machines for Strengthening   Cybex Knee Extension 5# 2 sets 10   Cybex Knee Flexion 15# 2 sets 10   Other Lumbar Machine Exercise seated row and lats 20# 2x10   Other Lumbar Machine Exercise pulley 5 # 10 time rotation   Knee/Hip Exercises: Stretches   Passive Hamstring  Stretch 30 seconds;3 reps   Piriformis Stretch 20 seconds;3 reps   Gastroc Stretch 3 reps;30 seconds   Knee/Hip Exercises: Seated   Other Seated Knee Exercises hip abd red tband 2 sets 10   Modalities   Modalities Ultrasound   Moist Heat Therapy   Number Minutes Moist Heat 15 Minutes   Moist Heat Location Lumbar Spine   Ultrasound   Ultrasound Location left low back   Ultrasound Parameters 100% 1MHz   Ultrasound Goals Pain                  PT Short Term Goals - 01/18/15 0903    PT SHORT TERM GOAL #1   Title independent with initial HEP   Status Achieved           PT Long Term Goals - 01/18/15 0903    PT LONG TERM GOAL #1   Title report pain decreased 25%   Status Achieved   PT LONG TERM GOAL #2   Title increase lumbar ROM 25%   Status On-going   PT LONG TERM GOAL #3   Title report tolerance to standing at 10 minutes to cook a meal   Status On-going   PT LONG TERM GOAL #4   Title report that she can walk 10 minutes   Status On-going               Plan - 01/24/15 0927    Clinical Impression Statement  Reporting increased pain over the long weekend, she is very tight in the LE's, she c/o cramping in calves and buttocks, she is very tender in the left lumbar area   PT Next Visit Plan Will add strength for core, but we may have to ask for more visits from [facility] on her next visit   Consulted and Agree with Plan of Care Patient        Problem List Patient Active Problem List   Diagnosis Date Noted  . Breast cancer, left breast 07/22/2014  . Obesity, Class III, BMI 40-49.9 (morbid obesity) 07/06/2014  . OSA (obstructive sleep apnea) 11/17/2013  . S/P lumbar spinal fusion 09/22/2013  . Paroxysmal atrial fibrillation 09/11/2011    Class: Acute  . Empty sella syndrome 09/11/2011    Class: Chronic  . Essential hypertension 09/11/2011  . Exogenous obesity 09/11/2011  . Gastroesophageal reflux disease 09/11/2011  . Thalassemia minor 09/11/2011    Class: Chronic  . Breast cancer 09/11/2011    Class: Chronic  . Degenerative joint disease 09/11/2011    Class: Chronic  . Irritable bowel disease 09/11/2011    Class: Chronic  . Lumbar disc disease 09/11/2011    Sumner Boast., PT 01/24/2015, 9:29 AM  [address] [place], [place], [address] Phone: [phone]   Fax:  [phone]

## 2015-01-30 ENCOUNTER — Encounter: Payer: Self-pay | Admitting: Cardiology

## 2015-01-30 DIAGNOSIS — H16103 Unspecified superficial keratitis, bilateral: Secondary | ICD-10-CM | POA: Diagnosis not present

## 2015-01-30 DIAGNOSIS — H35342 Macular cyst, hole, or pseudohole, left eye: Secondary | ICD-10-CM | POA: Diagnosis not present

## 2015-01-31 ENCOUNTER — Encounter: Payer: Self-pay | Admitting: Physical Therapy

## 2015-01-31 ENCOUNTER — Ambulatory Visit: Payer: Medicare PPO | Admitting: Physical Therapy

## 2015-01-31 DIAGNOSIS — M545 Low back pain, unspecified: Secondary | ICD-10-CM

## 2015-01-31 NOTE — Therapy (Signed)
[place], [place], [address] Phone: [phone]   Fax:  [phone]  Physical Therapy Treatment  Patient Details  Name: Jenna Mcdonald MRN: [id number] Date of Birth: [birth year] Referring Provider:  Eustace Moore, MD  Encounter Date: 01/31/2015      PT End of Session - 01/31/15 0930    Visit Number 6   Authorization Type [employer]   PT Start Time 0844   PT Stop Time 0940   PT Time Calculation (min) 56 min      Past Medical History  Diagnosis Date  . Diabetes mellitus   . A-fib   . Obesity   . Hypertension   . Irritable bowel   . Osteoarthritis   . Degenerative disc disease, lumbar     of the knees  . PONV (postoperative nausea and vomiting)   . Pneumonia   . Anemia   . Insomnia   . Thalassemia minor   . Empty sella syndrome   . Sleep apnea     bipap  . Gastroesophageal reflux disease     denies  . Breast CA     left  . Hypercholesteremia   . Vitamin D deficiency   . Low back pain   . Metabolic syndrome   . Osteopenia   . PAF (paroxysmal atrial fibrillation)   . Sigmoid diverticulosis   . Fatigue     Severe secondary to to gemfibrozil  . Hammertoe     left second toe    Past Surgical History  Procedure Laterality Date  . Replacement total knee Left 11  . Tonsillectomy    . Dilation and curettage of uterus    . Replacement total knee    . Tubal ligation    . Cystectomy  70    pilonidal   . Oophorectomy      wedge section not rem  . Breast surgery Left 10    lumpectomy  . Eye surgery Bilateral 12/14    cataracts  . Cesarean section    . Maximum access (mas)posterior lumbar interbody fusion (plif) 2 level N/A 09/22/2013    Procedure: FOR MAXIMUM ACCESS  POSTERIOR LUMBAR INTERBODY FUSION LUMBAR THREE-FOUR FOUR-FIVE;  Surgeon: Eustace Moore, MD;  Location: [facility];  Service: Neurosurgery;  Laterality: N/A;    There were no vitals filed for this  visit.  Visit Diagnosis:  Left-sided low back pain without sciatica      Subjective Assessment - 01/31/15 0848    Subjective The ultrasound felt really good, with the stretches I have been having less cramps in the calves   Currently in Pain? Yes   Pain Score 5    Pain Location Back   Pain Orientation Left   Pain Descriptors / Indicators Aching   Pain Type Chronic pain   Aggravating Factors  standing and activity   Pain Relieving Factors rest                         [facility] Adult PT Treatment/Exercise - 01/31/15 0001    Lumbar Exercises: Aerobic   Elliptical NuStep L4 x 6 minutes    Lumbar Exercises: Machines for Strengthening   Cybex Knee Extension 5# 2 sets 10, had to stop due to "too heavy"   Cybex Knee Flexion 20# 2 sets 15   Other Lumbar Machine Exercise seated row and lats 20# 2x10   Other Lumbar Machine Exercise pulley  5 # 10 time rotation   Knee/Hip Exercises: Stretches   Passive Hamstring Stretch 30 seconds;3 reps   Gastroc Stretch 3 reps;30 seconds   Knee/Hip Exercises: Seated   Other Seated Knee Exercises hip add with ball squeeze 20 times   Moist Heat Therapy   Number Minutes Moist Heat 15 Minutes   Moist Heat Location Lumbar Spine   Electrical Stimulation   Electrical Stimulation Location left low back   Electrical Stimulation Parameters IFC   Electrical Stimulation Goals Pain   Ultrasound   Ultrasound Location left low back   Ultrasound Parameters 100% 1 MHz   Ultrasound Goals Pain                  PT Short Term Goals - 01/18/15 3557    PT SHORT TERM GOAL #1   Title independent with initial HEP   Status Achieved           PT Long Term Goals - 01/31/15 3220    PT LONG TERM GOAL #2   Title increase lumbar ROM 25%   Status On-going   PT LONG TERM GOAL #3   Title report tolerance to standing at 10 minutes to cook a meal   Status Partially Met   PT LONG TERM GOAL #4   Title report that she can walk 10 minutes   Status  Partially Met               Plan - 01/31/15 0931    Clinical Impression Statement Reports that she is feeling better, she did some yardwork this morning as she is feeling better.  Less pain overall.  Continues to have left low back pain.   PT Frequency 1x / week   PT Duration 8 weeks   PT Treatment/Interventions Electrical Stimulation;Moist Heat;Therapeutic exercise;Therapeutic activities;Patient/family education;Manual techniques   PT Next Visit Plan Will ask for more visits form [employer].  She needs more core strength and flexibility to continue to help with less pain   Consulted and Agree with Plan of Care Patient        Problem List Patient Active Problem List   Diagnosis Date Noted  . Breast cancer, left breast 07/22/2014  . Obesity, Class III, BMI 40-49.9 (morbid obesity) 07/06/2014  . OSA (obstructive sleep apnea) 11/17/2013  . S/P lumbar spinal fusion 09/22/2013  . Paroxysmal atrial fibrillation 09/11/2011    Class: Acute  . Empty sella syndrome 09/11/2011    Class: Chronic  . Essential hypertension 09/11/2011  . Exogenous obesity 09/11/2011  . Gastroesophageal reflux disease 09/11/2011  . Thalassemia minor 09/11/2011    Class: Chronic  . Breast cancer 09/11/2011    Class: Chronic  . Degenerative joint disease 09/11/2011    Class: Chronic  . Irritable bowel disease 09/11/2011    Class: Chronic  . Lumbar disc disease 09/11/2011    Sumner Boast., PT 01/31/2015, 9:43 AM  [address] [place], [place], [address] Phone: [phone]   Fax:  [phone]

## 2015-02-07 ENCOUNTER — Emergency Department (HOSPITAL_COMMUNITY): Payer: Medicare PPO

## 2015-02-07 ENCOUNTER — Emergency Department (HOSPITAL_COMMUNITY)
Admission: EM | Admit: 2015-02-07 | Discharge: 2015-02-08 | Disposition: A | Discharge status: Home / Self Care | Payer: Medicare PPO | Attending: Emergency Medicine | Admitting: Emergency Medicine

## 2015-02-07 ENCOUNTER — Encounter (HOSPITAL_COMMUNITY): Payer: Self-pay | Admitting: *Deleted

## 2015-02-07 DIAGNOSIS — Z87891 Personal history of nicotine dependence: Secondary | ICD-10-CM | POA: Insufficient documentation

## 2015-02-07 DIAGNOSIS — E780 Pure hypercholesterolemia: Secondary | ICD-10-CM | POA: Diagnosis not present

## 2015-02-07 DIAGNOSIS — Z79899 Other long term (current) drug therapy: Secondary | ICD-10-CM | POA: Diagnosis not present

## 2015-02-07 DIAGNOSIS — I10 Essential (primary) hypertension: Secondary | ICD-10-CM | POA: Diagnosis not present

## 2015-02-07 DIAGNOSIS — R42 Dizziness and giddiness: Secondary | ICD-10-CM | POA: Insufficient documentation

## 2015-02-07 DIAGNOSIS — G44219 Episodic tension-type headache, not intractable: Secondary | ICD-10-CM | POA: Insufficient documentation

## 2015-02-07 DIAGNOSIS — G4730 Sleep apnea, unspecified: Secondary | ICD-10-CM | POA: Insufficient documentation

## 2015-02-07 DIAGNOSIS — Z8719 Personal history of other diseases of the digestive system: Secondary | ICD-10-CM | POA: Insufficient documentation

## 2015-02-07 DIAGNOSIS — E860 Dehydration: Secondary | ICD-10-CM | POA: Insufficient documentation

## 2015-02-07 DIAGNOSIS — K219 Gastro-esophageal reflux disease without esophagitis: Secondary | ICD-10-CM | POA: Insufficient documentation

## 2015-02-07 DIAGNOSIS — Z8701 Personal history of pneumonia (recurrent): Secondary | ICD-10-CM | POA: Diagnosis not present

## 2015-02-07 DIAGNOSIS — I480 Paroxysmal atrial fibrillation: Secondary | ICD-10-CM

## 2015-02-07 DIAGNOSIS — R0602 Shortness of breath: Secondary | ICD-10-CM | POA: Diagnosis not present

## 2015-02-07 DIAGNOSIS — Z853 Personal history of malignant neoplasm of breast: Secondary | ICD-10-CM | POA: Diagnosis not present

## 2015-02-07 DIAGNOSIS — Z862 Personal history of diseases of the blood and blood-forming organs and certain disorders involving the immune mechanism: Secondary | ICD-10-CM | POA: Insufficient documentation

## 2015-02-07 DIAGNOSIS — E119 Type 2 diabetes mellitus without complications: Secondary | ICD-10-CM | POA: Diagnosis not present

## 2015-02-07 DIAGNOSIS — E669 Obesity, unspecified: Secondary | ICD-10-CM | POA: Insufficient documentation

## 2015-02-07 DIAGNOSIS — K2970 Gastritis, unspecified, without bleeding: Secondary | ICD-10-CM | POA: Diagnosis not present

## 2015-02-07 DIAGNOSIS — R110 Nausea: Secondary | ICD-10-CM | POA: Diagnosis not present

## 2015-02-07 DIAGNOSIS — R51 Headache: Secondary | ICD-10-CM | POA: Diagnosis not present

## 2015-02-07 DIAGNOSIS — M1990 Unspecified osteoarthritis, unspecified site: Secondary | ICD-10-CM | POA: Insufficient documentation

## 2015-02-07 LAB — I-STAT TROPONIN, ED: Troponin i, poc: 0.01 ng/mL (ref 0.00–0.08)

## 2015-02-07 LAB — BASIC METABOLIC PANEL
Anion gap: 12 (ref 5–15)
BUN: 23 mg/dL — ABNORMAL HIGH (ref 6–20)
CO2: 22 mmol/L (ref 22–32)
Calcium: 9.4 mg/dL (ref 8.9–10.3)
Chloride: 105 mmol/L (ref 101–111)
Creatinine, Ser: 1.06 mg/dL — ABNORMAL HIGH (ref 0.44–1.00)
GFR calc Af Amer: >60 mL/min (ref >60)
GFR calc non Af Amer: 52 mL/min — ABNORMAL LOW (ref >60)
Glucose, Bld: 177 mg/dL — ABNORMAL HIGH (ref 65–99)
Potassium: 4.3 mmol/L (ref 3.5–5.1)
Sodium: 139 mmol/L (ref 135–145)

## 2015-02-07 LAB — CBC
HCT: 35.6 % — ABNORMAL LOW (ref 36.0–46.0)
Hemoglobin: 11.3 g/dL — ABNORMAL LOW (ref 12.0–15.0)
MCH: 19.2 pg — ABNORMAL LOW (ref 26.0–34.0)
MCHC: 31.7 g/dL (ref 30.0–36.0)
MCV: 60.4 fL — ABNORMAL LOW (ref 78.0–100.0)
Platelets: 195 10*3/uL (ref 150–400)
RBC: 5.89 MIL/uL — ABNORMAL HIGH (ref 3.87–5.11)
RDW: 16.5 % — ABNORMAL HIGH (ref 11.5–15.5)
WBC: 8.8 10*3/uL (ref 4.0–10.5)

## 2015-02-07 LAB — PROTIME-INR
INR: 1.92 — ABNORMAL HIGH (ref 0.00–1.49)
Prothrombin Time: 21.8 seconds — ABNORMAL HIGH (ref 11.6–15.2)

## 2015-02-07 MED ORDER — DEXAMETHASONE SODIUM PHOSPHATE 10 MG/ML IJ SOLN
10.0000 mg | Freq: Once | INTRAMUSCULAR | Status: DC
  Filled 2015-02-07: qty 1

## 2015-02-07 MED ORDER — ONDANSETRON HCL 4 MG/2ML IJ SOLN
4.0000 mg | Freq: Once | INTRAMUSCULAR | Status: DC
  Filled 2015-02-07: qty 2

## 2015-02-07 MED ORDER — METOCLOPRAMIDE HCL 5 MG/ML IJ SOLN
10.0000 mg | Freq: Once | INTRAMUSCULAR | Status: AC
  Administered 2015-02-07: 10 mg via INTRAVENOUS
  Filled 2015-02-07: qty 2

## 2015-02-07 MED ORDER — SODIUM CHLORIDE 0.9 % IV SOLN
Freq: Once | INTRAVENOUS | Status: AC
  Administered 2015-02-07: 23:00:00 via INTRAVENOUS

## 2015-02-07 MED ORDER — DIPHENHYDRAMINE HCL 50 MG/ML IJ SOLN
25.0000 mg | Freq: Once | INTRAMUSCULAR | Status: AC
  Administered 2015-02-07: 25 mg via INTRAVENOUS
  Filled 2015-02-07: qty 1

## 2015-02-07 NOTE — ED Notes (Signed)
Pt c/o headache; verbal orders obtained; pt declining decadron at this time, states "I just had steroids for my back"

## 2015-02-07 NOTE — ED Notes (Signed)
Pt c/o onset of dizziness this afternoon associated with nausea. EKG showed a-fib with a rate from 80-150. Pt given 4mg  zofran enroute without relief. Pt takes Cardizem and warfarin for afib, typically controlled with medications. Also c/o throbbing headache.

## 2015-02-07 NOTE — ED Notes (Signed)
Pt to ED via [facility] c/o onset of dizziness and nausea. Hx of afib; usually controlled; reports similar symptoms when in afib. Rate 80-150.

## 2015-02-07 NOTE — ED Notes (Signed)
Pt to CT

## 2015-02-07 NOTE — ED Provider Notes (Signed)
CSN: [id number]     Arrival date & time 02/07/15  2104 History   First MD Initiated Contact with Patient 02/07/15 2133     Chief Complaint  Patient presents with  . Atrial Fibrillation     (Consider location/radiation/quality/duration/timing/severity/associated sxs/prior Treatment) Patient is a 69 y.o. female presenting with dizziness.  Dizziness Quality:  Lightheadedness Severity:  Moderate Onset quality:  Sudden Duration:  2 hours Timing:  Constant Progression:  Waxing and waning Context: physical activity   Context: not with ear pain, not with eye movement, not with inactivity, not with loss of consciousness, not with medication, not when standing up and not when urinating   Context comment:  Dehydration Relieved by:  None tried Worsened by:  Sitting upright and standing up Ineffective treatments:  None tried Associated symptoms: headaches, nausea and palpitations   Associated symptoms: no blood in stool, no chest pain, no diarrhea, no hearing loss, no shortness of breath, no tinnitus, no vomiting and no weakness   Headaches:    Severity:  Moderate   Onset quality:  Sudden   Timing:  Constant   Progression:  Unchanged   Past Medical History  Diagnosis Date  . Diabetes mellitus   . A-fib   . Obesity   . Hypertension   . Irritable bowel   . Osteoarthritis   . Degenerative disc disease, lumbar     of the knees  . PONV (postoperative nausea and vomiting)   . Pneumonia   . Anemia   . Insomnia   . Thalassemia minor   . Empty sella syndrome   . Sleep apnea     bipap  . Gastroesophageal reflux disease     denies  . Breast CA     left  . Hypercholesteremia   . Vitamin D deficiency   . Low back pain   . Metabolic syndrome   . Osteopenia   . PAF (paroxysmal atrial fibrillation)   . Sigmoid diverticulosis   . Fatigue     Severe secondary to to gemfibrozil  . Hammertoe     left second toe   Past Surgical History  Procedure Laterality Date  . Replacement  total knee Left 11  . Tonsillectomy    . Dilation and curettage of uterus    . Replacement total knee    . Tubal ligation    . Cystectomy  70    pilonidal   . Oophorectomy      wedge section not rem  . Breast surgery Left 10    lumpectomy  . Eye surgery Bilateral 12/14    cataracts  . Cesarean section    . Maximum access (mas)posterior lumbar interbody fusion (plif) 2 level N/A 09/22/2013    Procedure: FOR MAXIMUM ACCESS  POSTERIOR LUMBAR INTERBODY FUSION LUMBAR THREE-FOUR FOUR-FIVE;  Surgeon: Eustace Moore, MD;  Location: [facility];  Service: Neurosurgery;  Laterality: N/A;   Family History  Problem Relation Age of Onset  . Anesthesia problems Mother   . Diabetes Mother   . Diabetes Father    History  Substance Use Topics  . Smoking status: Former Smoker -- 0.50 packs/day for 10 years    Types: Cigarettes    Quit date: 11/12/2003  . Smokeless tobacco: Not on file  . Alcohol Use: Yes   OB History    No data available     Review of Systems  Constitutional: Negative for fever, chills, appetite change and fatigue.  HENT: Negative for congestion, ear pain, facial swelling,  hearing loss, mouth sores, sore throat and tinnitus.   Eyes: Negative for visual disturbance.  Respiratory: Negative for cough, chest tightness and shortness of breath.   Cardiovascular: Positive for palpitations. Negative for chest pain.  Gastrointestinal: Positive for nausea. Negative for vomiting, abdominal pain, diarrhea and blood in stool.  Endocrine: Negative for cold intolerance and heat intolerance.  Genitourinary: Negative for frequency, decreased urine volume and difficulty urinating.  Musculoskeletal: Negative for back pain and neck stiffness.  Skin: Negative for rash.  Neurological: Positive for dizziness, light-headedness and headaches. Negative for tremors, syncope, facial asymmetry, speech difficulty, weakness and numbness.  All other systems reviewed and are negative.     Allergies   Aromasin; Femara; Gemfibrozil; Letrozole; Metformin and related; Metoprolol; Tamoxifen; Tetracyclines & related; Toprol xl; and Vicodin  Home Medications   Prior to Admission medications   Medication Sig Start Date End Date Taking? Authorizing Provider  acetaminophen (TYLENOL) 500 MG tablet Take 500 mg by mouth every 6 (six) hours as needed for mild pain. For back pain   Yes Historical Provider, MD  Calcium Carbonate-Vitamin D (CALCIUM 600 + D PO) Take 1 tablet by mouth daily.    Yes Historical Provider, MD  cyclobenzaprine (FLEXERIL) 10 MG tablet Take 10 mg by mouth 3 (three) times daily as needed for muscle spasms.   Yes Historical Provider, MD  diltiazem (CARDIZEM) 90 MG tablet TAKE 1 TABLET (90 MG TOTAL) BY MOUTH 3 (THREE) TIMES DAILY. 01/11/15  Yes Sueanne Margarita, MD  diphenhydrAMINE (SOMINEX) 25 MG tablet Take 25 mg by mouth at bedtime as needed for sleep.   Yes Historical Provider, MD  magnesium oxide (MAG-OX) 400 MG tablet Take 400 mg by mouth daily.    Yes Historical Provider, MD  metFORMIN (GLUCOPHAGE-XR) 500 MG 24 hr tablet Take 1 tablet by mouth 2 (two) times daily. 10/31/14  Yes Historical Provider, MD  methocarbamol (ROBAXIN) 500 MG tablet Take 1 tablet (500 mg total) by mouth every 6 (six) hours as needed for muscle spasms. 09/24/13  Yes Eustace Moore, MD  Multiple Vitamin (MULITIVITAMIN WITH MINERALS) TABS Take 1 tablet by mouth daily.   Yes Historical Provider, MD  traMADol (ULTRAM) 50 MG tablet Take 50 mg by mouth every 8 (eight) hours as needed for moderate pain.   Yes Historical Provider, MD  warfarin (COUMADIN) 5 MG tablet Take 5 mg by mouth every evening.    Yes Historical Provider, MD  propafenone (RYTHMOL) 225 MG tablet 1 tablet as needed for breakthrough AFib Patient not taking: Reported on 02/07/2015 01/04/14   Sueanne Margarita, MD   BP 136/60 mmHg  Pulse 88  Temp(Src) 98 F (36.7 C)  Resp 18  SpO2 96% Physical Exam  Constitutional: She is oriented to person, place, and  time. She appears well-developed and well-nourished. No distress.  HENT:  Head: Normocephalic and atraumatic.  Right Ear: External ear normal.  Left Ear: External ear normal.  Nose: Nose normal.  Eyes: Conjunctivae and EOM are normal. Pupils are equal, round, and reactive to light. Right eye exhibits no discharge. Left eye exhibits no discharge. No scleral icterus. Right eye exhibits no nystagmus. Left eye exhibits no nystagmus.  Visual fields intact  Neck: Normal range of motion. Neck supple.  Cardiovascular: Normal rate, regular rhythm and normal heart sounds.  Exam reveals no gallop and no friction rub.   No murmur heard. Pulmonary/Chest: Effort normal and breath sounds normal. No stridor. No respiratory distress. She has no wheezes.  Abdominal: Soft.  She exhibits no distension. There is no tenderness.  Musculoskeletal: She exhibits no edema or tenderness.  Neurological: She is alert and oriented to person, place, and time. She has normal strength. She displays no atrophy and no tremor. No cranial nerve deficit or sensory deficit. She exhibits normal muscle tone. She displays a negative Romberg sign. Coordination and gait normal. GCS eye subscore is 4. GCS verbal subscore is 5. GCS motor subscore is 6.  Skin: Skin is warm and dry. No rash noted. She is not diaphoretic. No erythema.  Psychiatric: She has a normal mood and affect.    ED Course  Procedures (including critical care time) Labs Review Labs Reviewed  CBC - Abnormal; Notable for the following:    RBC 5.89 (*)    Hemoglobin 11.3 (*)    HCT 35.6 (*)    MCV 60.4 (*)    MCH 19.2 (*)    RDW 16.5 (*)    All other components within normal limits  BASIC METABOLIC PANEL - Abnormal; Notable for the following:    Glucose, Bld 177 (*)    BUN 23 (*)    Creatinine, Ser 1.06 (*)    GFR calc non Af Amer 52 (*)    All other components within normal limits  PROTIME-INR - Abnormal; Notable for the following:    Prothrombin Time 21.8  (*)    INR 1.92 (*)    All other components within normal limits  I-STAT TROPOININ, ED    Imaging Review Dg Chest 2 View  02/07/2015   CLINICAL DATA:  Dizziness and nausea this afternoon. Atrial fibrillation. Headache and shortness of breath.  EXAM: CHEST  2 VIEW  COMPARISON:  09/14/2013  FINDINGS: Normal heart size and pulmonary vascularity. No focal airspace disease or consolidation in the lungs. No blunting of costophrenic angles. No pneumothorax. Mediastinal contours appear intact. Tortuous aorta. Degenerative changes in the spine and shoulders.  IMPRESSION: No active cardiopulmonary disease.   Electronically Signed   By: Lucienne Capers M.D.   On: 02/07/2015 22:13   Ct Head Wo Contrast  02/07/2015   CLINICAL DATA:  Severe headache since 7 p.m. with dizziness.  EXAM: CT HEAD WITHOUT CONTRAST  TECHNIQUE: Contiguous axial images were obtained from the base of the skull through the vertex without intravenous contrast.  COMPARISON:  CT temporal bones 06/13/2014.  CT head 09/11/2011.  FINDINGS: Intracranial contents are symmetrical. No mass effect or midline shift. No abnormal extra-axial fluid collections. Gray-white matter junctions are distinct. Basal cisterns are not effaced. No evidence of acute intracranial hemorrhage. No depressed skull fractures. Postoperative changes in the left mastoid air cells. Remaining mastoid air cells are diffusely opacified. Right mastoid air cells are not opacified. Paranasal sinuses are clear. Vascular calcifications.  IMPRESSION: No acute intracranial abnormalities. Postoperative changes in the left temporal bone with opacification of remaining left mastoid air cells.   Electronically Signed   By: Lucienne Capers M.D.   On: 02/07/2015 23:54     EKG Interpretation   Date/Time:  Wednesday February 07 2015 21:12:54 EDT Ventricular Rate:  104 PR Interval:    QRS Duration: 87 QT Interval:  350 QTC Calculation: 460 R Axis:   82 Text Interpretation:  Atrial  fibrillation Borderline right axis deviation  Repol abnrm suggests ischemia, anterolateral since last tracing no  significant change Confirmed by BELFI  MD, MELANIE ([id number]) on 02/07/2015  10:11:48 PM      MDM   69 year old female with a history of A. fib on  Coumadin and Cardizem, hypertension, OSA, obesity who presents with lightheadedness, headache, and nausea that began 2 hours prior to arrival. See history of present illness as above. Additionally patient reports that she was working outside today and has not hydrated well. She reports that she has had these similar episodes multiple times approximately 5 years ago. She denied any associated visual changes, chest pain, shortness of breath, vomiting. No recent illness or infections. Exam as above; nonfocal with no findings to suggest posterior infarct.  Of note patient was noted to be in A. fib by EMS in route with rates fluctuating from the 80s to 150s. No intervention given as the patient's was rate controlled on arrival.   EKG with A. fib at a rate of 104, right axis deviation, normal intervals. No evidence of acute ischemia or blocks. Similar to prior EKG. Labs obtained revealed evidence of dehydration.   CT head was obtained to assess for ICH and given the sudden onset of the headache with a history of warfarin. CT head was unremarkable.  Patient given IV fluid bolus and headache medicine. Symptoms significantly improved following and treatment. Presentation is most consistent with dehydration and tension headache. No evidence to suggest stroke, ICH, SAH.   Patient in good condition and deemed safe for discharge with strict return precautions. Patient to follow up with PCP as needed and with cardiology for further management of her A. Fib.  Patient was seen in conjunction with Dr. Tamera Punt.  Sibyl Parr, M.D. Resident  Final diagnoses:  Dehydration  Episodic tension-type headache, not intractable  Light headedness  Paroxysmal  atrial fibrillation        Addison Lank, MD 02/08/15 0100  Malvin Johns, MD 02/10/15 [phone]

## 2015-02-07 NOTE — ED Notes (Signed)
Pt requesting nausea medication; offered protocol orders for zofran; pt declined. Dr.Cardama at bedside

## 2015-02-08 ENCOUNTER — Telehealth: Payer: Self-pay

## 2015-02-08 ENCOUNTER — Telehealth: Payer: Self-pay | Admitting: Cardiology

## 2015-02-08 ENCOUNTER — Ambulatory Visit: Payer: Medicare PPO | Admitting: Physical Therapy

## 2015-02-08 NOTE — Discharge Instructions (Signed)
Dehydration, Adult °Dehydration is when you lose more fluids from the body than you take in. Vital organs like the kidneys, brain, and heart cannot function without a proper amount of fluids and salt. Any loss of fluids from the body can cause dehydration.  °CAUSES  °· Vomiting. °· Diarrhea. °· Excessive sweating. °· Excessive urine output. °· Fever. °SYMPTOMS  °Mild dehydration °· Thirst. °· Dry lips. °· Slightly dry mouth. °Moderate dehydration °· Very dry mouth. °· Sunken eyes. °· Skin does not bounce back quickly when lightly pinched and released. °· Dark urine and decreased urine production. °· Decreased tear production. °· Headache. °Severe dehydration °· Very dry mouth. °· Extreme thirst. °· Rapid, weak pulse (more than 100 beats per minute at rest). °· Cold hands and feet. °· Not able to sweat in spite of heat and temperature. °· Rapid breathing. °· Blue lips. °· Confusion and lethargy. °· Difficulty being awakened. °· Minimal urine production. °· No tears. °DIAGNOSIS  °Your caregiver will diagnose dehydration based on your symptoms and your exam. Blood and urine tests will help confirm the diagnosis. The diagnostic evaluation should also identify the cause of dehydration. °TREATMENT  °Treatment of mild or moderate dehydration can often be done at home by increasing the amount of fluids that you drink. It is best to drink small amounts of fluid more often. Drinking too much at one time can make vomiting worse. Refer to the home care instructions below. °Severe dehydration needs to be treated at the hospital where you will probably be given intravenous (IV) fluids that contain water and electrolytes. °HOME CARE INSTRUCTIONS  °· Ask your caregiver about specific rehydration instructions. °· Drink enough fluids to keep your urine clear or pale yellow. °· Drink small amounts frequently if you have nausea and vomiting. °· Eat as you normally do. °· Avoid: °¨ Foods or drinks high in sugar. °¨ Carbonated  drinks. °¨ Juice. °¨ Extremely hot or cold fluids. °¨ Drinks with caffeine. °¨ Fatty, greasy foods. °¨ Alcohol. °¨ Tobacco. °¨ Overeating. °¨ Gelatin desserts. °· Wash your hands well to avoid spreading bacteria and viruses. °· Only take over-the-counter or prescription medicines for pain, discomfort, or fever as directed by your caregiver. °· Ask your caregiver if you should continue all prescribed and over-the-counter medicines. °· Keep all follow-up appointments with your caregiver. °SEEK MEDICAL CARE IF: °· You have abdominal pain and it increases or stays in one area (localizes). °· You have a rash, stiff neck, or severe headache. °· You are irritable, sleepy, or difficult to awaken. °· You are weak, dizzy, or extremely thirsty. °SEEK IMMEDIATE MEDICAL CARE IF:  °· You are unable to keep fluids down or you get worse despite treatment. °· You have frequent episodes of vomiting or diarrhea. °· You have blood or green matter (bile) in your vomit. °· You have blood in your stool or your stool looks black and tarry. °· You have not urinated in 6 to 8 hours, or you have only urinated a small amount of very dark urine. °· You have a fever. °· You faint. °MAKE SURE YOU:  °· Understand these instructions. °· Will watch your condition. °· Will get help right away if you are not doing well or get worse. °Document Released: 08/11/2005 Document Revised: 11/03/2011 Document Reviewed: 03/31/2011 °[employer]® Patient Information ©2015 [employer]. This information is not intended to replace advice given to you by your health care provider. Make sure you discuss any questions you have with your health care   provider.  Dizziness  Dizziness means you feel unsteady or lightheaded. You might feel like you are going to pass out (faint). HOME CARE   Drink enough fluids to keep your pee (urine) clear or pale yellow.  Take your medicines exactly as told by your doctor. If you take blood pressure medicine, always stand up slowly  from the lying or sitting position. Hold on to something to steady yourself.  If you need to stand in one place for a long time, move your legs often. Tighten and relax your leg muscles.  Have someone stay with you until you feel okay.  Do not drive or use heavy machinery if you feel dizzy.  Do not drink alcohol. GET HELP RIGHT AWAY IF:   You feel dizzy or lightheaded and it gets worse.  You feel sick to your stomach (nauseous), or you throw up (vomit).  You have trouble talking or walking.  You feel weak or have trouble using your arms, hands, or legs.  You cannot think clearly or have trouble forming sentences.  You have chest pain, belly (abdominal) pain, sweating, or you are short of breath.  Your vision changes.  You are bleeding.  You have problems from your medicine that seem to be getting worse. MAKE SURE YOU:   Understand these instructions.  Will watch your condition.  Will get help right away if you are not doing well or get worse. Document Released: 07/31/2011 Document Revised: 11/03/2011 Document Reviewed: 07/31/2011 [facility] Patient Information 2015 [place], [place]. This information is not intended to replace advice given to you by your health care provider. Make sure you discuss any questions you have with your health care provider.  Rehydration, Adult Rehydration is the replacement of body fluids lost during dehydration. Dehydration is an extreme loss of body fluids to the point of body function impairment. There are many ways extreme fluid loss can occur, including vomiting, diarrhea, or excess sweating. Recovering from dehydration requires replacing lost fluids, continuing to eat to maintain strength, and avoiding foods and beverages that may contribute to further fluid loss or may increase nausea. HOW TO REHYDRATE In most cases, rehydration involves the replacement of not only fluids but also carbohydrates and basic body salts. Rehydration with an oral  rehydration solution is one way to replace essential nutrients lost through dehydration. An oral rehydration solution can be purchased at pharmacies, retail stores, and online. Premixed packets of powder that you combine with water to make a solution are also sold. You can prepare an oral rehydration solution at home by mixing the following ingredients together:    - tsp table salt.   tsp baking soda.   tsp salt substitute containing potassium chloride.  1 tablespoons sugar.  1 L (34 oz) of water. Be sure to use exact measurements. Including too much sugar can make diarrhea worse. Drink -1 cup (120-240 mL) of oral rehydration solution each time you have diarrhea or vomit. If drinking this amount makes your vomiting worse, try drinking smaller amounts more often. For example, drink 1-3 tsp every 5-10 minutes.  A general rule for staying hydrated is to drink 1-2 L of fluid per day. Talk to your caregiver about the specific amount you should be drinking each day. Drink enough fluids to keep your urine clear or pale yellow. EATING WHEN DEHYDRATED Even if you have had severe sweating or you are having diarrhea, do not stop eating. Many healthy items in a normal diet are okay to continue eating while recovering  from dehydration. The following tips can help you to lessen nausea when you eat:  Ask someone else to prepare your food. Cooking smells may worsen nausea.  Eat in a well-ventilated room away from cooking smells.  Sit up when you eat. Avoid lying down until 1-2 hours after eating.  Eat small amounts when you eat.  Eat foods that are easy to digest. These include soft, well-cooked, or mashed foods. FOODS AND BEVERAGES TO AVOID Avoid eating or drinking the following foods and beverages that may increase nausea or further loss of fluid:   Fruit juices with a high sugar content, such as concentrated juices.  Alcohol.  Beverages containing caffeine.  Carbonated drinks. They may  cause a lot of gas.  Foods that may cause a lot of gas, such as cabbage, broccoli, and beans.  Fatty, greasy, and fried foods.  Spicy, very salty, and very sweet foods or drinks.  Foods or drinks that are very hot or very cold. Consume food or drinks at or near room temperature.  Foods that need a lot of chewing, such as raw vegetables.  Foods that are sticky or hard to swallow, such as peanut butter. Document Released: 11/03/2011 Document Revised: 05/05/2012 Document Reviewed: 11/03/2011 [facility] Patient Information 2015 [place], [place]. This information is not intended to replace advice given to you by your health care provider. Make sure you discuss any questions you have with your health care provider.

## 2015-02-08 NOTE — Telephone Encounter (Signed)
02/08/15 patient was hospitalized last night with atrial fib will call and reschedule PT

## 2015-02-08 NOTE — Telephone Encounter (Signed)
Have her come in for EKG to see if she is still in afib.  She needs to avoid working outside in extremes of temperatures.

## 2015-02-08 NOTE — Telephone Encounter (Signed)
Patient st she went to the ED last night. She was nauseous, dizzy, and experiencing a terrible HA. She also went into Afib for a short time. She was treated for dehydration. They gave her fluids and nausea medication and she went home. Patient st she feels much better and is no longer in fib, but wanted to see if Dr. Radford Pax has any instructions for her.   To Dr. Radford Pax for recommendations.

## 2015-02-08 NOTE — ED Notes (Signed)
Pt given water to drink. 

## 2015-02-08 NOTE — Telephone Encounter (Signed)
New Prob    Pt states she was in the hospital last night. She was advised to call the office today. Please call.

## 2015-02-09 DIAGNOSIS — Z7901 Long term (current) use of anticoagulants: Secondary | ICD-10-CM | POA: Diagnosis not present

## 2015-02-09 NOTE — Telephone Encounter (Signed)
Left message to call back  

## 2015-02-09 NOTE — Telephone Encounter (Signed)
Patient to come 6/21 for EKG.

## 2015-02-13 ENCOUNTER — Ambulatory Visit (INDEPENDENT_AMBULATORY_CARE_PROVIDER_SITE_OTHER): Payer: Medicare PPO

## 2015-02-13 VITALS — BP 130/62 | HR 85 | Resp 18 | Ht 64.5 in | Wt 240.0 lb

## 2015-02-13 DIAGNOSIS — I480 Paroxysmal atrial fibrillation: Secondary | ICD-10-CM

## 2015-02-13 NOTE — Progress Notes (Signed)
1.) Reason for visit: EKG  2.) Name of MD requesting visit: Dr. Radford Pax  3.) H&P: Patient has history of atrial fibrillation, HTN, obesity, sleep apnea, hypercholesteremia, anemia, diabetes mellitus, and other no cardiac related medical conditions. Patient recently went to ED on 02/07/2015 with dehydration and reported an atrial fibrillation rhythm which was resolved at discharge. Patient has been drinking water and taking her medications as prescribed. EKG today is to make sure patient is still not in A. Fib.  Patient is 5' 4.5'' at  240 lbs. Vital signs HR 85, BP 130/62, and Resp. 18. EKG showed normal sinus rhythm with no significant changes from previous EKG.  4.) ROS related to problem: Patient is 5' 4.5'' at  240 lbs. Vital signs HR 85, BP 130/62, and Resp. 18. EKG showed normal sinus rhythm with no significant changes from previous EKG.  5.) Assessment and plan per MD: Richardson Dopp PA (FLEX) reviewed EKG with reported reading above. No new orders at this time. Will forward to Dr. Radford Pax.

## 2015-02-13 NOTE — Patient Instructions (Signed)
NO CHANGES

## 2015-02-14 ENCOUNTER — Encounter: Payer: Self-pay | Admitting: Physical Therapy

## 2015-02-14 ENCOUNTER — Ambulatory Visit: Payer: Medicare PPO | Admitting: Physical Therapy

## 2015-02-14 DIAGNOSIS — M545 Low back pain, unspecified: Secondary | ICD-10-CM

## 2015-02-14 NOTE — Therapy (Signed)
[facility] [place], [place], [address] Phone: [phone]   Fax:  [phone]  Physical Therapy Treatment  Patient Details  Name: Jenna Mcdonald MRN: [id number] Date of Birth: 14-Mar-1946 Referring Provider:  Eustace Moore, MD  Encounter Date: 02/14/2015      PT End of Session - 02/14/15 1620    Visit Number 7   Number of Visits 9   Authorization Type [employer]   PT Start Time 3785   PT Stop Time 1632   PT Time Calculation (min) 68 min   Activity Tolerance Patient limited by pain      Past Medical History  Diagnosis Date  . Diabetes mellitus   . A-fib   . Obesity   . Hypertension   . Irritable bowel   . Osteoarthritis   . Degenerative disc disease, lumbar     of the knees  . PONV (postoperative nausea and vomiting)   . Pneumonia   . Anemia   . Insomnia   . Thalassemia minor   . Empty sella syndrome   . Sleep apnea     bipap  . Gastroesophageal reflux disease     denies  . Breast CA     left  . Hypercholesteremia   . Vitamin D deficiency   . Low back pain   . Metabolic syndrome   . Osteopenia   . PAF (paroxysmal atrial fibrillation)   . Sigmoid diverticulosis   . Fatigue     Severe secondary to to gemfibrozil  . Hammertoe     left second toe    Past Surgical History  Procedure Laterality Date  . Replacement total knee Left 11  . Tonsillectomy    . Dilation and curettage of uterus    . Replacement total knee    . Tubal ligation    . Cystectomy  70    pilonidal   . Oophorectomy      wedge section not rem  . Breast surgery Left 10    lumpectomy  . Eye surgery Bilateral 12/14    cataracts  . Cesarean section    . Maximum access (mas)posterior lumbar interbody fusion (plif) 2 level N/A 09/22/2013    Procedure: FOR MAXIMUM ACCESS  POSTERIOR LUMBAR INTERBODY FUSION LUMBAR THREE-FOUR FOUR-FIVE;  Surgeon: Eustace Moore, MD;  Location: [facility];  Service: Neurosurgery;   Laterality: N/A;    There were no vitals filed for this visit.  Visit Diagnosis:  Left-sided low back pain without sciatica      Subjective Assessment - 02/14/15 1533    Subjective (p) Patient reports that she was in the hospital ED due to dehydration.  No changes on the back pain   Currently in Pain? (p) Yes   Pain Score (p) 7    Pain Location (p) Back   Pain Orientation (p) Left   Pain Descriptors / Indicators (p) Aching                         [facility] Adult PT Treatment/Exercise - 02/14/15 0001    Lumbar Exercises: Aerobic   Elliptical NuStep L4 x 6 minutes    Lumbar Exercises: Machines for Strengthening   Cybex Knee Extension 5# 2 sets 10, had to stop due to "too heavy"   Cybex Knee Flexion 20# 2 sets 15   Other Lumbar Machine Exercise seated row and lats 20# 2x10   Other Lumbar Machine  Exercise pulley 5 # 10 time rotation   Knee/Hip Exercises: Stretches   Passive Hamstring Stretch 30 seconds;3 reps   Gastroc Stretch 3 reps;30 seconds   Knee/Hip Exercises: Aerobic   Stationary Bike Nustep L5 7 min   Knee/Hip Exercises: Machines for Strengthening   Cybex Knee Extension 5# 3 sets 10   Cybex Knee Flexion 15# 3 sets 10   Knee/Hip Exercises: Standing   Other Standing Knee Exercises red tband hip ext and abd 1 sets 10 bilaterally   Knee/Hip Exercises: Seated   Other Seated Knee/Hip Exercises hip abd red tband 2 sets 10   Moist Heat Therapy   Number Minutes Moist Heat 15 Minutes   Moist Heat Location Lumbar Spine   Electrical Stimulation   Electrical Stimulation Location left low back   Electrical Stimulation Parameters IFC   Electrical Stimulation Goals Pain   Ultrasound   Ultrasound Location left low back   Ultrasound Parameters 100% 1MHz   Ultrasound Goals Pain                  PT Short Term Goals - 01/18/15 0903    PT SHORT TERM GOAL #1   Title independent with initial HEP   Status Achieved           PT Long Term Goals - 01/31/15  0942    PT LONG TERM GOAL #2   Title increase lumbar ROM 25%   Status On-going   PT LONG TERM GOAL #3   Title report tolerance to standing at 10 minutes to cook a meal   Status Partially Met   PT LONG TERM GOAL #4   Title report that she can walk 10 minutes   Status Partially Met               Plan - 02/14/15 1620    Clinical Impression Statement Was in the ED due to dehydration, "just overdoing it", reports that she has felt bad all over and that the recovery has caused some increase of LBP   PT Next Visit Plan has 2 more visits available   Consulted and Agree with Plan of Care Patient        Problem List Patient Active Problem List   Diagnosis Date Noted  . Breast cancer, left breast 07/22/2014  . Obesity, Class III, BMI 40-49.9 (morbid obesity) 07/06/2014  . OSA (obstructive sleep apnea) 11/17/2013  . S/P lumbar spinal fusion 09/22/2013  . Paroxysmal atrial fibrillation 09/11/2011    Class: Acute  . Empty sella syndrome 09/11/2011    Class: Chronic  . Essential hypertension 09/11/2011  . Exogenous obesity 09/11/2011  . Gastroesophageal reflux disease 09/11/2011  . Thalassemia minor 09/11/2011    Class: Chronic  . Breast cancer 09/11/2011    Class: Chronic  . Degenerative joint disease 09/11/2011    Class: Chronic  . Irritable bowel disease 09/11/2011    Class: Chronic  . Lumbar disc disease 09/11/2011    Sumner Boast., PT 02/14/2015, 4:25 PM  [address] [place], [place], [address] Phone: [phone]   Fax:  [phone]

## 2015-02-20 ENCOUNTER — Encounter: Payer: Self-pay | Admitting: Physical Therapy

## 2015-02-20 ENCOUNTER — Ambulatory Visit: Payer: Medicare PPO | Admitting: Physical Therapy

## 2015-02-20 DIAGNOSIS — M545 Low back pain, unspecified: Secondary | ICD-10-CM

## 2015-02-20 NOTE — Therapy (Signed)
[place], [place], [address] Phone: [phone]   Fax:  [phone]  Physical Therapy Treatment  Patient Details  Name: Jenna Mcdonald MRN: [id number] Date of Birth: [birth year] Referring Provider:  Eustace Moore, MD  Encounter Date: 02/20/2015      PT End of Session - 02/20/15 0851    Visit Number 8   Number of Visits 9   Authorization Type [employer]   PT Start Time 0800   PT Stop Time 0900   PT Time Calculation (min) 60 min      Past Medical History  Diagnosis Date  . Diabetes mellitus   . A-fib   . Obesity   . Hypertension   . Irritable bowel   . Osteoarthritis   . Degenerative disc disease, lumbar     of the knees  . PONV (postoperative nausea and vomiting)   . Pneumonia   . Anemia   . Insomnia   . Thalassemia minor   . Empty sella syndrome   . Sleep apnea     bipap  . Gastroesophageal reflux disease     denies  . Breast CA     left  . Hypercholesteremia   . Vitamin D deficiency   . Low back pain   . Metabolic syndrome   . Osteopenia   . PAF (paroxysmal atrial fibrillation)   . Sigmoid diverticulosis   . Fatigue     Severe secondary to to gemfibrozil  . Hammertoe     left second toe    Past Surgical History  Procedure Laterality Date  . Replacement total knee Left 11  . Tonsillectomy    . Dilation and curettage of uterus    . Replacement total knee    . Tubal ligation    . Cystectomy  70    pilonidal   . Oophorectomy      wedge section not rem  . Breast surgery Left 10    lumpectomy  . Eye surgery Bilateral 12/14    cataracts  . Cesarean section    . Maximum access (mas)posterior lumbar interbody fusion (plif) 2 level N/A 09/22/2013    Procedure: FOR MAXIMUM ACCESS  POSTERIOR LUMBAR INTERBODY FUSION LUMBAR THREE-FOUR FOUR-FIVE;  Surgeon: Eustace Moore, MD;  Location: [facility];  Service: Neurosurgery;  Laterality: N/A;    There were no vitals filed  for this visit.  Visit Diagnosis:  Left-sided low back pain without sciatica      Subjective Assessment - 02/20/15 0800    Subjective [place] helps, I think shot is wearing off. No changes in back. Doing some off HEP at home.   Pain Score 6    Pain Location Back                         [facility] Adult PT Treatment/Exercise - 02/20/15 0001    Lumbar Exercises: Machines for Strengthening   Other Lumbar Machine Exercise seated row and lats 20# 2x10   Other Lumbar Machine Exercise pulley 5 # 10 time rotation   Knee/Hip Exercises: Aerobic   Stationary Bike Nustep L5 7 min   Knee/Hip Exercises: Machines for Strengthening   Cybex Knee Extension 5# 3 sets 10   Cybex Knee Flexion 15# 3 sets 10   Knee/Hip Exercises: Standing   Other Standing Knee Exercises red tband hip ext and abd 1 sets 15 bilaterally  multi c/o increased pain esp  with SLS on Left   Moist Heat Therapy   Number Minutes Moist Heat 15 Minutes   Moist Heat Location Lumbar Spine   Ultrasound   Ultrasound Location LB Left more RT   Ultrasound Parameters 100% 1 mHz 1.3 w /cm 2   Ultrasound Goals Pain                  PT Short Term Goals - 01/18/15 3818    PT SHORT TERM GOAL #1   Title independent with initial HEP   Status Achieved           PT Long Term Goals - 02/20/15 0851    PT LONG TERM GOAL #2   Title increase lumbar ROM 25%   Status On-going   PT LONG TERM GOAL #3   Title report tolerance to standing at 10 minutes to cook a meal   Status Achieved   PT LONG TERM GOAL #4   Title report that she can walk 10 minutes   Status On-going               Plan - 02/20/15 4037    Clinical Impression Statement pt with increased pain on left side today, tolerated ther ex with some compensation and VCing needed. Encouraged increased HEP compliance.   PT Next Visit Plan has 2 more visits available        Problem List Patient Active Problem List   Diagnosis Date Noted  . Breast  cancer, left breast 07/22/2014  . Obesity, Class III, BMI 40-49.9 (morbid obesity) 07/06/2014  . OSA (obstructive sleep apnea) 11/17/2013  . S/P lumbar spinal fusion 09/22/2013  . Paroxysmal atrial fibrillation 09/11/2011    Class: Acute  . Empty sella syndrome 09/11/2011    Class: Chronic  . Essential hypertension 09/11/2011  . Exogenous obesity 09/11/2011  . Gastroesophageal reflux disease 09/11/2011  . Thalassemia minor 09/11/2011    Class: Chronic  . Breast cancer 09/11/2011    Class: Chronic  . Degenerative joint disease 09/11/2011    Class: Chronic  . Irritable bowel disease 09/11/2011    Class: Chronic  . Lumbar disc disease 09/11/2011    Adaleigh Warf,ANGIE PTA 02/20/2015, 8:53 AM  [address] [place], [place], [address] Phone: [phone]   Fax:  [phone]

## 2015-03-01 ENCOUNTER — Ambulatory Visit: Payer: Medicare PPO | Attending: Neurological Surgery | Admitting: Physical Therapy

## 2015-03-01 ENCOUNTER — Encounter: Payer: Self-pay | Admitting: Physical Therapy

## 2015-03-01 DIAGNOSIS — M545 Low back pain, unspecified: Secondary | ICD-10-CM

## 2015-03-01 NOTE — Therapy (Addendum)
[place], [place], [address] Phone: [phone]   Fax:  [phone]  Physical Therapy Treatment  Patient Details  Name: Jenna Mcdonald MRN: [id number] Date of Birth: 1945-12-19 Referring Provider:  Eustace Moore, MD  Encounter Date: 03/01/2015      PT End of Session - 03/01/15 1101    Visit Number 9   Number of Visits 9   PT Start Time 1100   PT Stop Time 1200   PT Time Calculation (min) 60 min      Past Medical History  Diagnosis Date  . Diabetes mellitus   . A-fib   . Obesity   . Hypertension   . Irritable bowel   . Osteoarthritis   . Degenerative disc disease, lumbar     of the knees  . PONV (postoperative nausea and vomiting)   . Pneumonia   . Anemia   . Insomnia   . Thalassemia minor   . Empty sella syndrome   . Sleep apnea     bipap  . Gastroesophageal reflux disease     denies  . Breast CA     left  . Hypercholesteremia   . Vitamin D deficiency   . Low back pain   . Metabolic syndrome   . Osteopenia   . PAF (paroxysmal atrial fibrillation)   . Sigmoid diverticulosis   . Fatigue     Severe secondary to to gemfibrozil  . Hammertoe     left second toe    Past Surgical History  Procedure Laterality Date  . Replacement total knee Left 11  . Tonsillectomy    . Dilation and curettage of uterus    . Replacement total knee    . Tubal ligation    . Cystectomy  70    pilonidal   . Oophorectomy      wedge section not rem  . Breast surgery Left 10    lumpectomy  . Eye surgery Bilateral 12/14    cataracts  . Cesarean section    . Maximum access (mas)posterior lumbar interbody fusion (plif) 2 level N/A 09/22/2013    Procedure: FOR MAXIMUM ACCESS  POSTERIOR LUMBAR INTERBODY FUSION LUMBAR THREE-FOUR FOUR-FIVE;  Surgeon: Eustace Moore, MD;  Location: [facility];  Service: Neurosurgery;  Laterality: N/A;    There were no vitals filed for this visit.  Visit  Diagnosis:  Left-sided low back pain without sciatica      Subjective Assessment - 03/01/15 1100    Subjective left side is very bad,seeing MD 7/12 to discuss options. Using TENS at home some. [place] is good,great temporary relief. HEP at home some.   Currently in Pain? Yes   Pain Score 9    Pain Location Back   Pain Orientation Left                         [facility] Adult PT Treatment/Exercise - 03/01/15 0001    Lumbar Exercises: Aerobic   Elliptical NuStep L4 x 8 minutes    Lumbar Exercises: Machines for Strengthening   Cybex Lumbar Extension blue tband 15 times   Cybex Knee Flexion 20# 2 sets 15   Other Lumbar Machine Exercise seated row and lats 20# 2x15   Knee/Hip Exercises: Standing   Walking with Sports Cord 20# 3 times each way   Knee/Hip Exercises: Seated   Long Arc Quad Right;Left;2 sets;Strengthening;10 reps  red tband  Other Seated Knee/Hip Exercises hip add with ball squeeze 20 times   Other Seated Knee/Hip Exercises hip abd red tband 2 sets 10   Ultrasound   Ultrasound Location Low back   Ultrasound Parameters 100% 1.3 w/cm 2 3 mhz   Ultrasound Goals Pain                  PT Short Term Goals - 01/18/15 3888    PT SHORT TERM GOAL #1   Title independent with initial HEP   Status Achieved           PT Long Term Goals - 03/01/15 1105    PT LONG TERM GOAL #1   Title report pain decreased 25%   Status Achieved   PT LONG TERM GOAL #2   Title increase lumbar ROM 25%   Status On-going   PT LONG TERM GOAL #3   Title report tolerance to standing at 10 minutes to cook a meal   Status Achieved   PT LONG TERM GOAL #4   Title report that she can walk 10 minutes   Status On-going               Plan - 03/01/15 1102    Clinical Impression Statement pt amb less than 150 feet without stoopping to rest d/t pain, c/o weakness in extremities. Minimal progress, [place] helps but ther ex limited by pain.    PT Next Visit Plan PT seeing MD 7/12  to discuss options for pain control. Will ask for add'l PT visits for continued strengthening ,requesting 5 add'l visits        Problem List Patient Active Problem List   Diagnosis Date Noted  . Breast cancer, left breast 07/22/2014  . Obesity, Class III, BMI 40-49.9 (morbid obesity) 07/06/2014  . OSA (obstructive sleep apnea) 11/17/2013  . S/P lumbar spinal fusion 09/22/2013  . Paroxysmal atrial fibrillation 09/11/2011    Class: Acute  . Empty sella syndrome 09/11/2011    Class: Chronic  . Essential hypertension 09/11/2011  . Exogenous obesity 09/11/2011  . Gastroesophageal reflux disease 09/11/2011  . Thalassemia minor 09/11/2011    Class: Chronic  . Breast cancer 09/11/2011    Class: Chronic  . Degenerative joint disease 09/11/2011    Class: Chronic  . Irritable bowel disease 09/11/2011    Class: Chronic  . Lumbar disc disease 09/11/2011   PHYSICAL THERAPY DISCHARGE SUMMARY  Visits from Start of Care: 9  Plan: Patient agrees to discharge.  Patient goals were not met. Patient is being discharged due to not returning since the last visit.  ?????        Iyonnah Ferrante,ANGIE PTA 03/01/2015, 11:44 AM  [place], [place], [address] Phone: [phone]   Fax:  [phone]

## 2015-03-06 DIAGNOSIS — M545 Low back pain: Secondary | ICD-10-CM | POA: Diagnosis not present

## 2015-03-06 DIAGNOSIS — Z6841 Body Mass Index (BMI) 40.0 and over, adult: Secondary | ICD-10-CM | POA: Diagnosis not present

## 2015-03-12 DIAGNOSIS — Z7901 Long term (current) use of anticoagulants: Secondary | ICD-10-CM | POA: Diagnosis not present

## 2015-03-21 DIAGNOSIS — G4733 Obstructive sleep apnea (adult) (pediatric): Secondary | ICD-10-CM | POA: Diagnosis not present

## 2015-03-26 ENCOUNTER — Other Ambulatory Visit: Payer: Self-pay | Admitting: Neurological Surgery

## 2015-03-26 DIAGNOSIS — M545 Low back pain: Secondary | ICD-10-CM

## 2015-03-29 ENCOUNTER — Ambulatory Visit
Admission: RE | Admit: 2015-03-29 | Discharge: 2015-03-29 | Disposition: A | Discharge status: Home / Self Care | Payer: Medicare PPO | Source: Ambulatory Visit | Attending: Neurological Surgery | Admitting: Neurological Surgery

## 2015-03-29 DIAGNOSIS — M47817 Spondylosis without myelopathy or radiculopathy, lumbosacral region: Secondary | ICD-10-CM | POA: Diagnosis not present

## 2015-03-29 DIAGNOSIS — M5137 Other intervertebral disc degeneration, lumbosacral region: Secondary | ICD-10-CM | POA: Diagnosis not present

## 2015-03-29 DIAGNOSIS — M545 Low back pain: Secondary | ICD-10-CM

## 2015-03-29 DIAGNOSIS — Z981 Arthrodesis status: Secondary | ICD-10-CM | POA: Diagnosis not present

## 2015-03-31 ENCOUNTER — Other Ambulatory Visit: Payer: Self-pay | Admitting: Cardiology

## 2015-04-02 ENCOUNTER — Other Ambulatory Visit: Payer: Self-pay | Admitting: Cardiology

## 2015-04-03 ENCOUNTER — Other Ambulatory Visit: Payer: Self-pay | Admitting: Cardiology

## 2015-04-09 ENCOUNTER — Other Ambulatory Visit: Payer: Self-pay

## 2015-04-09 ENCOUNTER — Other Ambulatory Visit: Payer: Self-pay | Admitting: Internal Medicine

## 2015-04-09 ENCOUNTER — Other Ambulatory Visit: Payer: Self-pay | Admitting: Oncology

## 2015-04-09 DIAGNOSIS — Z853 Personal history of malignant neoplasm of breast: Secondary | ICD-10-CM

## 2015-04-09 DIAGNOSIS — Z7901 Long term (current) use of anticoagulants: Secondary | ICD-10-CM | POA: Diagnosis not present

## 2015-05-07 DIAGNOSIS — Z7901 Long term (current) use of anticoagulants: Secondary | ICD-10-CM | POA: Diagnosis not present

## 2015-05-08 DIAGNOSIS — H9193 Unspecified hearing loss, bilateral: Secondary | ICD-10-CM | POA: Diagnosis not present

## 2015-05-08 DIAGNOSIS — H93293 Other abnormal auditory perceptions, bilateral: Secondary | ICD-10-CM | POA: Diagnosis not present

## 2015-05-14 ENCOUNTER — Ambulatory Visit
Admission: RE | Admit: 2015-05-14 | Discharge: 2015-05-14 | Disposition: A | Discharge status: Home / Self Care | Payer: Medicare PPO | Source: Ambulatory Visit | Attending: Oncology | Admitting: Oncology

## 2015-05-14 DIAGNOSIS — R928 Other abnormal and inconclusive findings on diagnostic imaging of breast: Secondary | ICD-10-CM | POA: Diagnosis not present

## 2015-05-14 DIAGNOSIS — Z853 Personal history of malignant neoplasm of breast: Secondary | ICD-10-CM

## 2015-05-17 DIAGNOSIS — H04129 Dry eye syndrome of unspecified lacrimal gland: Secondary | ICD-10-CM | POA: Insufficient documentation

## 2015-06-04 DIAGNOSIS — Z7901 Long term (current) use of anticoagulants: Secondary | ICD-10-CM | POA: Diagnosis not present

## 2015-06-04 DIAGNOSIS — Z23 Encounter for immunization: Secondary | ICD-10-CM | POA: Diagnosis not present

## 2015-06-22 DIAGNOSIS — G4733 Obstructive sleep apnea (adult) (pediatric): Secondary | ICD-10-CM | POA: Diagnosis not present

## 2015-07-02 DIAGNOSIS — G4733 Obstructive sleep apnea (adult) (pediatric): Secondary | ICD-10-CM | POA: Diagnosis not present

## 2015-07-04 DIAGNOSIS — Z7901 Long term (current) use of anticoagulants: Secondary | ICD-10-CM | POA: Diagnosis not present

## 2015-07-24 DIAGNOSIS — H903 Sensorineural hearing loss, bilateral: Secondary | ICD-10-CM | POA: Diagnosis not present

## 2015-08-01 DIAGNOSIS — Z7901 Long term (current) use of anticoagulants: Secondary | ICD-10-CM | POA: Diagnosis not present

## 2015-09-03 ENCOUNTER — Ambulatory Visit: Payer: Medicare PPO | Admitting: Cardiology

## 2015-09-06 ENCOUNTER — Encounter: Payer: Self-pay | Admitting: Cardiology

## 2015-09-06 ENCOUNTER — Ambulatory Visit (INDEPENDENT_AMBULATORY_CARE_PROVIDER_SITE_OTHER): Payer: Medicare Other | Admitting: Cardiology

## 2015-09-06 VITALS — BP 120/70 | HR 92 | Ht 64.5 in | Wt 244.0 lb

## 2015-09-06 DIAGNOSIS — I480 Paroxysmal atrial fibrillation: Secondary | ICD-10-CM

## 2015-09-06 DIAGNOSIS — I10 Essential (primary) hypertension: Secondary | ICD-10-CM

## 2015-09-06 DIAGNOSIS — G4733 Obstructive sleep apnea (adult) (pediatric): Secondary | ICD-10-CM

## 2015-09-06 NOTE — Progress Notes (Signed)
Cardiology Office Note   Date:  09/06/2015   ID:  Jenna Mcdonald, DOB 09/14/1945, MRN [id number]  PCP:  Henrine Screws, MD    Chief Complaint  Patient presents with  . Atrial Fibrillation  . Hypertension  . Sleep Apnea      History of Present Illness: Jenna Mcdonald is a 70y.o. female with a history of OSA on BiPAP, HTN, PAF and morbid obesity who presents today for followup.  She is doing well with her BiPAP device. She tolerates the nasal pillow mask and feels the pressure is adequate. She does not feel rested in the am and feels tired during the day.  She goes to bed at 9:30pm and get up at 5am and get up once at night.  She denies any breakthrough of PAF. She has not had to take any Rhythmol.She denies any chest pain. She has chronic DOE which is stable and actually improved. She has not noted any significant LE edema. She denies any dizziness, palpitations or syncope.   Past Medical History  Diagnosis Date  . Diabetes mellitus   . A-fib (Jenna Mcdonald)   . Obesity   . Hypertension   . Irritable bowel   . Osteoarthritis   . Degenerative disc disease, lumbar     of the knees  . PONV (postoperative nausea and vomiting)   . Pneumonia   . Anemia   . Insomnia   . Thalassemia minor   . 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[facility])   . Sleep apnea     bipap  . Gastroesophageal reflux disease     denies  . Breast CA (Jenna Mcdonald)     left  . Hypercholesteremia   . Vitamin D deficiency   . Low back pain   . Metabolic syndrome   . Osteopenia   . PAF (paroxysmal atrial fibrillation) (Jenna Mcdonald)   . Sigmoid diverticulosis   . Fatigue     Severe secondary to to gemfibrozil  . Hammertoe     left second toe    Past Surgical History  Procedure Laterality Date  . Replacement total knee Left 11  . Tonsillectomy    . Dilation and curettage of uterus    . Replacement total knee    . Tubal ligation    . Cystectomy  70    pilonidal   . Oophorectomy      wedge section not  rem  . Breast surgery Left 10    lumpectomy  . Eye surgery Bilateral 12/14    cataracts  . Cesarean section    . Maximum access (mas)posterior lumbar interbody fusion (plif) 2 level N/A 09/22/2013    Procedure: FOR MAXIMUM ACCESS  POSTERIOR LUMBAR INTERBODY FUSION LUMBAR THREE-FOUR FOUR-FIVE;  Surgeon: Eustace Moore, MD;  Location: [facility];  Service: Neurosurgery;  Laterality: N/A;     Current Outpatient Prescriptions  Medication Sig Dispense Refill  . acetaminophen (TYLENOL) 500 MG tablet Take 500 mg by mouth every 6 (six) hours as needed for mild pain. For back pain    . Calcium Carbonate-Vitamin D (CALCIUM 600 + D PO) Take 1 tablet by mouth daily.     . cyclobenzaprine (FLEXERIL) 10 MG tablet Take 10 mg by mouth 3 (three) times daily as needed for muscle spasms.    Marland Kitchen diltiazem (CARDIZEM) 90 MG tablet Take 90 mg by mouth 2 (two) times daily.    . magnesium oxide (  MAG-OX) 400 MG tablet Take 400 mg by mouth 3 (three) times a week.     . metFORMIN (GLUCOPHAGE-XR) 500 MG 24 hr tablet Take 1 tablet by mouth 2 (two) times daily.  3  . methocarbamol (ROBAXIN) 500 MG tablet Take 1 tablet (500 mg total) by mouth every 6 (six) hours as needed for muscle spasms. 60 tablet 1  . Multiple Vitamin (MULITIVITAMIN WITH MINERALS) TABS Take 1 tablet by mouth daily.    . propafenone (RYTHMOL) 225 MG tablet 1 tablet as needed for breakthrough AFib 15 tablet 3  . traMADol (ULTRAM) 50 MG tablet Take 50 mg by mouth every 8 (eight) hours as needed for moderate pain.    Marland Kitchen warfarin (COUMADIN) 5 MG tablet Take 5 mg by mouth every evening.     . [DISCONTINUED] flecainide (TAMBOCOR) 50 MG tablet Take 1 tablet (50 mg total) by mouth every 12 (twelve) hours. 60 tablet 12   No current facility-administered medications for this visit.    Allergies:   Aromasin; Femara; Gemfibrozil; Letrozole; Metformin and related; Metoprolol; Other; Tamoxifen; Tetracycline; Tetracyclines & related; Toprol xl; Verapamil; and Vicodin     Social History:  The patient  reports that she quit smoking about 11 years ago. Her smoking use included Cigarettes. She has a 5 pack-year smoking history. She does not have any smokeless tobacco history on file. She reports that she drinks alcohol. She reports that she does not use illicit drugs.   Family History:  The patient's family history includes Anesthesia problems in her mother; Diabetes in her father and mother.    ROS:  Please see the history of present illness.   Otherwise, review of systems are positive for none.   All other systems are reviewed and negative.    PHYSICAL EXAM: VS:  BP 120/70 mmHg  Pulse 92  Ht 5' 4.5" (1.638 m)  Wt 244 lb (110.678 kg)  BMI 41.25 kg/m2 , BMI Body mass index is 41.25 kg/(m^2). GEN: Well nourished, well developed, in no acute distress HEENT: normal Neck: no JVD, carotid bruits, or masses Cardiac: RRR; no  rubs, or gallops,no edema.  2/6 SM at RUSB Respiratory:  clear to auscultation bilaterally, normal work of breathing GI: soft, nontender, nondistended, + BS MS: no deformity or atrophy Skin: warm and dry, no rash Neuro:  Strength and sensation are intact Psych: euthymic mood, full affect   EKG:  EKG is not ordered today.    Recent Labs: 02/07/2015: BUN 23*; Creatinine, Ser 1.06*; Hemoglobin 11.3*; Platelets 195; Potassium 4.3; Sodium 139    Lipid Panel    Component Value Date/Time   CHOL  05/14/2008 0435    146        ATP III CLASSIFICATION:  <200     mg/dL   Desirable  200-239  mg/dL   Borderline High  >=240    mg/dL   High   TRIG 105 05/14/2008 0435   HDL 27* 05/14/2008 0435   CHOLHDL 5.4 05/14/2008 0435   VLDL 21 05/14/2008 0435   LDLCALC  05/14/2008 0435    98        Total Cholesterol/HDL:CHD Risk Coronary Heart Disease Risk Table                     Men   Women  1/2 Average Risk   3.4   3.3      Wt Readings from Last 3 Encounters:  09/06/15 244 lb (110.678 kg)  02/13/15 240 lb (108.863  kg)  01/04/15 239  lb 12.8 oz (108.773 kg)    ASSESSMENT AND PLAN:  1. HTN - well controlled - continue Cardizem  2. PAF maintaining - now back in NSR - continue Cardizem/Warfarin  - PRN Rhythmol 3. OSA on BiPAP and doing well. Continue on current therapy. Her d/l today showed an AHI of 2.3/hr on 11/7cm H2O and 97% compliant in using more than 4 hours nightly.   4. Obesity - I have encouraged her to watch her portions and exercise as she can.   5. Chronic systemic anticoagulation    Current medicines are reviewed at length with the patient today.  The patient does not have concerns regarding medicines.  The following changes have been made:  no change  Labs/ tests ordered today: See above Assessment and Plan No orders of the defined types were placed in this encounter.     Disposition:   FU with me in 1 year  Signed, Sueanne Margarita, MD  09/06/2015 1:41 PM    [facility] [address], [place], [place]  [address] Phone: [phone]; Fax: [phone]

## 2015-09-06 NOTE — Patient Instructions (Signed)

## 2015-09-11 ENCOUNTER — Encounter: Payer: Self-pay | Admitting: Cardiology

## 2015-09-19 ENCOUNTER — Telehealth: Payer: Self-pay | Admitting: Cardiology

## 2015-09-19 NOTE — Telephone Encounter (Signed)
Pt c/o swelling: STAT is pt has developed SOB within 24 hours  Pt c/o of swollen feet and chest tightness last week- for three days. Please call back and discuss.    1. How long have you been experiencing swelling? On/off couple weeks- mostly at night  2. Where is the swelling located? Below ankle- mostly right foot  3.  Are you currently taking a "fluid pill"?no  4.  Are you currently SOB? Not really-   5.  Have you traveled recently?[place] last week-

## 2015-09-19 NOTE — Telephone Encounter (Signed)
Patient st she has felt "really crummy" for about a month. She felt fine at Dr. Theodosia Blender OV a couple weeks ago, but went to Dr. Inda Merlin' office 1/16 c/o body aches, number finger, numb toes, and losing hair.  A couple days after Dr. Inda Merlin' OV, she started experiencing chest tightness "so tight it hurt." This chest discomfort continued for about a week.  She now has no chest tightness, but does c/o swelling BLE R>L.  She st sometimes when she is in atrial fibrillation she will swell and get chest discomfort, but she can't really tell otherwise. Patient is extremely anxious and audibly upset on the phone. She st Dr. Inda Merlin doesn't help her and she "needs to figure out what's wrong."  She wants to talk about medications that could possibly be making her hair fall out and change them (Dr. Inda Merlin mentioned switching to Xarelto but she talked to a pharmacist who told her Xarelto can cause this too).  She bought a BP machine, but has no BP to report.  She is taking her medications as directed.  Per patient request, scheduled an OV with PA tomorrow at 0800. Patient grateful for prompt care and understands to go to to the ED if chest pain returns/worsens.

## 2015-09-19 NOTE — Progress Notes (Signed)
Cardiology Office Note Date:  09/20/2015  Patient ID:  Jenna, Mcdonald [birth year], MRN [id number] PCP:  Henrine Screws, MD  Cardiologist:  Dr. Radford Pax   Chief Complaint: multiple c/o, generalized fatigue, alopecia, CP  History of Present Illness: Jenna Mcdonald is a 70 y.o. female with history of PAFib on PRN Rhythmol that she says she has never used, DM, HTN, OSA using BIPAP, morbid obesity, Thalassemia minor, OA comes to the office today with c/o last wel Mon-Wed a constant nagging pressure in the center of her chest, non-radiating, not associated with SOB, no N/V, no diaphoresis, no palpitations, but states she thinks she may have had AF, no dizziness, near syncope or syncope.  She mentions that her son was getting married on Friday and did not want to go to the hospital and risk not making it to [place] for the wedding, by Thursday she was feeling better and has been since then.  She also c/o in the last few months feeling at the end of the day the tops of her feet are very swollen and uncomfortable.  She has a bad back with chronic pain, and chronic b/l LE pain and sits most of her day with her feet down on the floor.  She also c/ofor about 6 months  her fingertips and toes particularly left are numb with pins/needles type of feeling, also she has noted in the last months as well her hair is falling out.  This she mentioned to her PMD last week and he ran thyroid and vitamin blood tests and she was told were OK.  She saw Dr. Radford Pax 09/06/15, and at that time was doing well with stable or improved baseline DOE only. Her PMD monitors and manages her warfarin, she remarks has been very steady over the years, she denies any bleeding or signs of bleeding.  Last week he suggested to her to consider changing to Xarelto given her hair loss, but she states the pharmacist told her Xarelto could do the same, and she is very reluctant to make the change she has been on warfarin and doing well on  it for many years.   Past Medical History  Diagnosis Date  . Diabetes mellitus   . A-fib ([place])   . Obesity   . Hypertension   . Irritable bowel   . Osteoarthritis   . Degenerative disc disease, lumbar     of the knees  . PONV (postoperative nausea and vomiting)   . Pneumonia   . Anemia   . Insomnia   . Thalassemia minor   . 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[place])   . Sleep apnea     bipap  . Gastroesophageal reflux disease     denies  . Breast CA ([place])     left  . Hypercholesteremia   . Vitamin D deficiency   . Low back pain   . Metabolic syndrome   . Osteopenia   . PAF (paroxysmal atrial fibrillation) ([place])   . Sigmoid diverticulosis   . Fatigue     Severe secondary to to gemfibrozil  . Hammertoe     left second toe    Past Surgical History  Procedure Laterality Date  . Replacement total knee Left 11  . Tonsillectomy    . Dilation and curettage of uterus    . Replacement total knee    . Tubal ligation    . Cystectomy  70    pilonidal   . Oophorectomy  wedge section not rem  . Breast surgery Left 10    lumpectomy  . Eye surgery Bilateral 12/14    cataracts  . Cesarean section    . Maximum access (mas)posterior lumbar interbody fusion (plif) 2 level N/A 09/22/2013    Procedure: FOR MAXIMUM ACCESS  POSTERIOR LUMBAR INTERBODY FUSION LUMBAR THREE-FOUR FOUR-FIVE;  Surgeon: Eustace Moore, MD;  Location: [facility];  Service: Neurosurgery;  Laterality: N/A;    Current Outpatient Prescriptions  Medication Sig Dispense Refill  . acetaminophen (TYLENOL) 500 MG tablet Take 500 mg by mouth every 6 (six) hours as needed for mild pain. For back pain    . Calcium Carbonate-Vitamin D (CALCIUM 600 + D PO) Take 1 tablet by mouth daily.     . cyclobenzaprine (FLEXERIL) 10 MG tablet Take 10 mg by mouth 3 (three) times daily as needed for muscle spasms.    Marland Kitchen diltiazem (CARDIZEM) 90 MG tablet Take 90 mg by mouth 2 (two) times daily.    . magnesium oxide (MAG-OX) 400 MG tablet Take  400 mg by mouth 3 (three) times a week.     . metFORMIN (GLUCOPHAGE-XR) 500 MG 24 hr tablet Take 1 tablet by mouth 2 (two) times daily.  3  . methocarbamol (ROBAXIN) 500 MG tablet Take 1 tablet (500 mg total) by mouth every 6 (six) hours as needed for muscle spasms. 60 tablet 1  . Multiple Vitamin (MULITIVITAMIN WITH MINERALS) TABS Take 1 tablet by mouth daily.    . propafenone (RYTHMOL) 225 MG tablet 1 tablet as needed for breakthrough AFib 15 tablet 3  . traMADol (ULTRAM) 50 MG tablet Take 50 mg by mouth every 8 (eight) hours as needed for moderate pain.    Marland Kitchen warfarin (COUMADIN) 5 MG tablet Take 5 mg by mouth every evening.     . [DISCONTINUED] flecainide (TAMBOCOR) 50 MG tablet Take 1 tablet (50 mg total) by mouth every 12 (twelve) hours. 60 tablet 12   No current facility-administered medications for this visit.    Allergies:   Aromasin; Femara; Gemfibrozil; Letrozole; Metformin and related; Metoprolol; Other; Tamoxifen; Tetracycline; Tetracyclines & related; Toprol xl; Verapamil; and Vicodin   Social History:  The patient  reports that she quit smoking about 11 years ago. Her smoking use included Cigarettes. She has a 5 pack-year smoking history. She does not have any smokeless tobacco history on file. She reports that she drinks alcohol. She reports that she does not use illicit drugs.   Family History:  The patient's family history includes Anesthesia problems in her mother; Diabetes in her father and mother.  ROS:  Please see the history of present illness.  All other systems are reviewed and otherwise negative.   PHYSICAL EXAM:  VS:  BP 128/58 mmHg  Pulse 77  Ht 5' 4.5" (1.638 m)  Wt 246 lb (111.585 kg)  BMI 41.59 kg/m2 BMI: Body mass index is 41.59 kg/(m^2). Very plesen, moderately obese WF, in no acute distress, no pallor HEENT: normocephalic, atraumatic Neck: no JVD, carotid bruits or masses Cardiac:  normal S1, S2; RRR; 2/6 SM, no rubs, or gallops Lungs:  clear to  auscultation bilaterally, no wheezing, rhonchi or rales Abd: soft, nontender MS: no deformity or atrophy Ext: no edema is appreciated today, she has 3+ equal radial and pedal pulses Skin: warm and dry, no rash Neuro:  No gross deficits appreciated Psych: euthymic mood, full affect   EKG:  Done today shows SR, nonspecific T changes, similar to her previous  01/12/15 Echocardiogram: Study Conclusions - Left ventricle: The cavity size was normal. Systolic function was normal. The estimated ejection fraction was in the range of 60% to 65%. Wall motion was normal; there were no regional wall motion abnormalities. - Aortic valve: Mild thickening, consistent with sclerosis. Transvalvular velocity was within the normal range. There was no stenosis. There was no regurgitation. - Mitral valve: Moderate focal calcification of the anterior leaflet (medial segment(s)). There was mild regurgitation.  01/26/14: Lexiscan stress myoview Impression Exercise Capacity: Lexiscan with no exercise. BP Response: Normal blood pressure response. Clinical Symptoms: No significant symptoms noted. ECG Impression: No significant ST segment change suggestive of ischemia. Comparison with Prior Nuclear Study: No significant change from previous study  Overall Impression: Normal stress nuclear study. Atrial fibrillation with RVR.     Recent Labs: 02/07/2015: BUN 23*; Creatinine, Ser 1.06*; Hemoglobin 11.3*; Platelets 195; Potassium 4.3; Sodium 139  No results found for requested labs within last 365 days.   CrCl cannot be calculated (Patient has no serum creatinine result on file.).   Wt Readings from Last 3 Encounters:  09/20/15 246 lb (111.585 kg)  09/06/15 244 lb (110.678 kg)  02/13/15 240 lb (108.863 kg)     Other studies reviewed: Additional studies/records reviewed today include: summarized above  ASSESSMENT AND PLAN:  1. CP      No known hx of CAD     Not an ongoing c/o      Negative stress myoview 2015     Given her risk factors will schedule her for a lexiscan stress myoview  2. PAFib     CHADS2 Vasc is at least 3 on coumadin     We will schedule a 48hour monitor      She is instructed to keep adequately hydrated and avoid stimulants,      her last known AF event was June 2016, and no symptoms until last week, ?PAF      No changes for now  3. HTN      Appears well controlled  4. OSA      She reports compliance with BiPAP  5. Pedal edema     Sounds of dependent edema, she is instructed to avoid long periods of sitting with her feet down, elevate her legs when possible while sitting  6. Fingers/toes neuropathy     She has known significant back trouble prior surgery and she says new bulging disks     Possibly musculoskeletal and/or DM neuropathy, instructed to f/u with her PMD  7. Alopecia     she will f/u with her PMD  Disposition: We will send her for stress testing, plan 48hour holter, get BMET and CBC (she states her PMD only did thyroid and vitamin testing)  given her fatigue, and f/u in 87mo, we can move this out if her testing looks OK and she remains without further chest discomfort/symptoms.  Current medicines are reviewed at length with the patient today.  The patient did not have any concerns regarding medicines.  Haywood Lasso, PA-C 09/20/2015 9:17 AM     [facility] [address] [place] [address] [phone] (office)  [phone] (fax)

## 2015-09-20 ENCOUNTER — Encounter: Payer: Self-pay | Admitting: Physician Assistant

## 2015-09-20 ENCOUNTER — Ambulatory Visit (INDEPENDENT_AMBULATORY_CARE_PROVIDER_SITE_OTHER): Payer: Medicare Other | Admitting: Physician Assistant

## 2015-09-20 VITALS — BP 128/58 | HR 77 | Ht 64.5 in | Wt 246.0 lb

## 2015-09-20 DIAGNOSIS — I10 Essential (primary) hypertension: Secondary | ICD-10-CM | POA: Diagnosis not present

## 2015-09-20 DIAGNOSIS — R079 Chest pain, unspecified: Secondary | ICD-10-CM | POA: Diagnosis not present

## 2015-09-20 DIAGNOSIS — I480 Paroxysmal atrial fibrillation: Secondary | ICD-10-CM | POA: Diagnosis not present

## 2015-09-20 LAB — CBC
HCT: 35.8 % — ABNORMAL LOW (ref 36.0–46.0)
Hemoglobin: 11 g/dL — ABNORMAL LOW (ref 12.0–15.0)
MCH: 18.9 pg — ABNORMAL LOW (ref 26.0–34.0)
MCHC: 30.7 g/dL (ref 30.0–36.0)
MCV: 61.4 fL — ABNORMAL LOW (ref 78.0–100.0)
Platelets: 195 10*3/uL (ref 150–400)
RBC: 5.83 MIL/uL — ABNORMAL HIGH (ref 3.87–5.11)
RDW: 17.6 % — ABNORMAL HIGH (ref 11.5–15.5)
WBC: 6.6 10*3/uL (ref 4.0–10.5)

## 2015-09-20 LAB — BASIC METABOLIC PANEL
BUN: 18 mg/dL (ref 7–25)
CO2: 21 mmol/L (ref 20–31)
Calcium: 9.5 mg/dL (ref 8.6–10.4)
Chloride: 104 mmol/L (ref 98–110)
Creat: 0.96 mg/dL (ref 0.50–0.99)
Glucose, Bld: 167 mg/dL — ABNORMAL HIGH (ref 65–99)
Potassium: 4.3 mmol/L (ref 3.5–5.3)
Sodium: 137 mmol/L (ref 135–146)

## 2015-09-20 NOTE — Patient Instructions (Addendum)
Medication Instructions:   Continue same medications   If you need a refill on your cardiac medications before your next appointment, please call your pharmacy.  Labwork:  BMET AND CBC   Testing/Procedures:  Your physician has recommended that you wear a 48 holter monitor. Holter monitors are medical devices that record the heart's electrical activity. Doctors most often use these monitors to diagnose arrhythmias. Arrhythmias are problems with the speed or rhythm of the heartbeat. The monitor is a small, portable device. You can wear one while you do your normal daily activities. This is usually used to diagnose what is causing palpitations/syncope (passing out).  Your physician has requested that you have a lexiscan myoview. For further information please visit [email or web address]. Please follow instruction sheet, as given.     Follow-Up:  IN ONE MONTH APP    Any Other Special Instructions Will Be Listed Below (If Applicable).

## 2015-09-21 ENCOUNTER — Telehealth: Payer: Self-pay | Admitting: *Deleted

## 2015-09-21 NOTE — Telephone Encounter (Signed)
-----   Message from [place], [place] sent at 09/21/2015 10:04 AM EST ----- Please let her know her labs are stable, though her blood sugar non-fasting a little high and watch her diet with her DM, f/u with her PMD for this.  Thanks [employer]

## 2015-09-23 NOTE — Telephone Encounter (Signed)
Have her see extender to followup on CP

## 2015-09-23 NOTE — Telephone Encounter (Signed)
See if she could meet with pharmacist to go over medications and potential side effects she is talking about

## 2015-09-24 ENCOUNTER — Telehealth (HOSPITAL_COMMUNITY): Payer: Self-pay | Admitting: *Deleted

## 2015-09-24 NOTE — Telephone Encounter (Signed)
Patient saw Charlcie Cradle, [place] 1/26 for evaluation. She has stress test and holter monitor appointments this week and is scheduled for a FU OV with Brittainy later this month.

## 2015-09-24 NOTE — Telephone Encounter (Signed)
Patient given detailed instructions per Myocardial Perfusion Study Information Sheet for the test on 09/26/15 at 1230. Patient notified to arrive 15 minutes early and that it is imperative to arrive on time for appointment to keep from having the test rescheduled.  If you need to cancel or reschedule your appointment, please call the office within 24 hours of your appointment. Failure to do so may result in a cancellation of your appointment, and a $50 no show fee. Patient verbalized understanding.Calhoun Reichardt, Ranae [place]

## 2015-09-26 ENCOUNTER — Ambulatory Visit (HOSPITAL_COMMUNITY): Payer: Medicare Other | Attending: Cardiovascular Disease

## 2015-09-26 ENCOUNTER — Other Ambulatory Visit: Payer: Self-pay | Admitting: Physician Assistant

## 2015-09-26 ENCOUNTER — Encounter (HOSPITAL_COMMUNITY): Payer: Self-pay

## 2015-09-26 VITALS — Ht 64.5 in | Wt 246.0 lb

## 2015-09-26 DIAGNOSIS — R079 Chest pain, unspecified: Secondary | ICD-10-CM | POA: Diagnosis present

## 2015-09-26 DIAGNOSIS — I10 Essential (primary) hypertension: Secondary | ICD-10-CM | POA: Insufficient documentation

## 2015-09-26 DIAGNOSIS — I480 Paroxysmal atrial fibrillation: Secondary | ICD-10-CM

## 2015-09-26 DIAGNOSIS — R0602 Shortness of breath: Secondary | ICD-10-CM

## 2015-09-26 MED ORDER — TECHNETIUM TC 99M SESTAMIBI GENERIC - CARDIOLITE
32.8000 | Freq: Once | INTRAVENOUS | Status: AC | PRN
  Administered 2015-09-26: 32.8 via INTRAVENOUS

## 2015-09-26 MED ORDER — AMINOPHYLLINE 25 MG/ML IV SOLN
75.0000 mg | Freq: Once | INTRAVENOUS | Status: AC
  Administered 2015-09-26: 75 mg via INTRAVENOUS

## 2015-09-26 MED ORDER — REGADENOSON 0.4 MG/5ML IV SOLN
0.4000 mg | Freq: Once | INTRAVENOUS | Status: AC
  Administered 2015-09-26: 0.4 mg via INTRAVENOUS

## 2015-09-27 ENCOUNTER — Ambulatory Visit (INDEPENDENT_AMBULATORY_CARE_PROVIDER_SITE_OTHER): Payer: Medicare Other

## 2015-09-27 ENCOUNTER — Encounter (HOSPITAL_COMMUNITY): Payer: Medicare Other | Attending: Cardiovascular Disease

## 2015-09-27 DIAGNOSIS — I480 Paroxysmal atrial fibrillation: Secondary | ICD-10-CM

## 2015-09-27 DIAGNOSIS — R079 Chest pain, unspecified: Secondary | ICD-10-CM

## 2015-09-27 LAB — MYOCARDIAL PERFUSION IMAGING
LV dias vol: 86 mL
LV sys vol: 21 mL
Peak HR: 102 {beats}/min
RATE: 0.34
Rest HR: 80 {beats}/min
SDS: 11
SRS: 1
SSS: 12
TID: 0.94

## 2015-09-27 MED ORDER — TECHNETIUM TC 99M SESTAMIBI GENERIC - CARDIOLITE
31.9000 | Freq: Once | INTRAVENOUS | Status: AC | PRN
  Administered 2015-09-27: 31.9 via INTRAVENOUS

## 2015-09-28 ENCOUNTER — Telehealth: Payer: Self-pay | Admitting: *Deleted

## 2015-09-28 NOTE — Telephone Encounter (Signed)
-----   Message from [facility], [place] sent at 09/27/2015  7:29 PM EST ----- Please let her know the stress test looked good.  If she is still feeling better and with out any recurrent symptoms, we can move her f/u appointment to 3 months.  Please forward the result to Dr. Radford Pax.  Thanks [employer]

## 2015-09-30 ENCOUNTER — Other Ambulatory Visit: Payer: Self-pay | Admitting: Cardiology

## 2015-10-01 ENCOUNTER — Telehealth: Payer: Self-pay | Admitting: *Deleted

## 2015-10-01 NOTE — Telephone Encounter (Signed)
SPOKE TO PT ABOUT APPT CHANGE TO URSUY AND HOW SHE WAS FEELING SYMPTOMS WISE SINCE STRESS TEST AND SHE STATED SHE WAS HAVING FEELINGS OF CP AND STOMACH DISCOMFORT A DAY AND HALF AFTER TEST BUT FEELS MUCH BETTER NOW

## 2015-10-02 ENCOUNTER — Encounter: Payer: Self-pay | Admitting: Cardiology

## 2015-10-05 ENCOUNTER — Telehealth: Payer: Self-pay

## 2015-10-05 NOTE — Telephone Encounter (Signed)
-----   Message from Sueanne Margarita, MD sent at 10/04/2015  3:24 PM EST ----- Heart monitor showed normal rhyhm with frequent PACs, nonsustained atrial tachycardia and PVCs.  No Atrial fib.  See if she is willing to increase Cardizem to 90mg  TID

## 2015-10-05 NOTE — Telephone Encounter (Signed)
Informed patient of results and verbal understanding expressed.  Instructed patient to INCREASE CARDIZEM to 90 mg TID.  Patient agrees with treatment plan.

## 2015-10-18 NOTE — Progress Notes (Deleted)
Cardiology Office Note Date:  10/18/2015  Patient ID:  Jenna Mcdonald, Jenna Mcdonald 08-24-1946, MRN [id number] PCP:  Henrine Screws, MD  Cardiologist:  ***  ***refresh   Chief Complaint: ***  History of Present Illness: Jenna Mcdonald is a 70 y.o. female with history of ***   Past Medical History  Diagnosis Date  . Diabetes mellitus   . A-fib ([place])   . Obesity   . Hypertension   . Irritable bowel   . Osteoarthritis   . Degenerative disc disease, lumbar     of the knees  . PONV (postoperative nausea and vomiting)   . Pneumonia   . Anemia   . Insomnia   . Thalassemia minor   . 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[place])   . Sleep apnea     bipap  . Gastroesophageal reflux disease     denies  . Breast CA ([place])     left  . Hypercholesteremia   . Vitamin D deficiency   . Low back pain   . Metabolic syndrome   . Osteopenia   . PAF (paroxysmal atrial fibrillation) ([place])   . Sigmoid diverticulosis   . Fatigue     Severe secondary to to gemfibrozil  . Hammertoe     left second toe    Past Surgical History  Procedure Laterality Date  . Replacement total knee Left 11  . Tonsillectomy    . Dilation and curettage of uterus    . Replacement total knee    . Tubal ligation    . Cystectomy  70    pilonidal   . Oophorectomy      wedge section not rem  . Breast surgery Left 10    lumpectomy  . Eye surgery Bilateral 12/14    cataracts  . Cesarean section    . Maximum access (mas)posterior lumbar interbody fusion (plif) 2 level N/A 09/22/2013    Procedure: FOR MAXIMUM ACCESS  POSTERIOR LUMBAR INTERBODY FUSION LUMBAR THREE-FOUR FOUR-FIVE;  Surgeon: Eustace Moore, MD;  Location: [place] [facility];  Service: Neurosurgery;  Laterality: N/A;    Current Outpatient Prescriptions  Medication Sig Dispense Refill  . acetaminophen (TYLENOL) 500 MG tablet Take 500 mg by mouth every 6 (six) hours as needed for mild pain. For back pain    . Calcium Carbonate-Vitamin D (CALCIUM 600 + D PO) Take 1  tablet by mouth daily.     . cyclobenzaprine (FLEXERIL) 10 MG tablet Take 10 mg by mouth 3 (three) times daily as needed for muscle spasms.    Marland Kitchen diltiazem (CARDIZEM) 90 MG tablet TAKE 1 TABLET (90 MG TOTAL) BY MOUTH 3 (THREE) TIMES DAILY. 270 tablet 3  . magnesium oxide (MAG-OX) 400 MG tablet Take 400 mg by mouth 3 (three) times a week.     . metFORMIN (GLUCOPHAGE-XR) 500 MG 24 hr tablet Take 1 tablet by mouth 2 (two) times daily.  3  . methocarbamol (ROBAXIN) 500 MG tablet Take 1 tablet (500 mg total) by mouth every 6 (six) hours as needed for muscle spasms. 60 tablet 1  . Multiple Vitamin (MULITIVITAMIN WITH MINERALS) TABS Take 1 tablet by mouth daily.    . propafenone (RYTHMOL) 225 MG tablet 1 tablet as needed for breakthrough AFib 15 tablet 3  . traMADol (ULTRAM) 50 MG tablet Take 50 mg by mouth every 8 (eight) hours as needed for moderate pain.    Marland Kitchen warfarin (COUMADIN) 5 MG tablet Take 5 mg by mouth every evening.     . [  DISCONTINUED] flecainide (TAMBOCOR) 50 MG tablet Take 1 tablet (50 mg total) by mouth every 12 (twelve) hours. 60 tablet 12   No current facility-administered medications for this visit.    Allergies:   Aromasin; Femara; Gemfibrozil; Letrozole; Metformin and related; Metoprolol; Other; Tamoxifen; Tetracycline; Tetracyclines & related; Toprol xl; Verapamil; and Vicodin   Social History:  The patient  reports that she quit smoking about 11 years ago. Her smoking use included Cigarettes. She has a 5 pack-year smoking history. She does not have any smokeless tobacco history on file. She reports that she drinks alcohol. She reports that she does not use illicit drugs.   Family History:  The patient's family history includes Anesthesia problems in her mother; Diabetes in her father and mother.***  ROS:  Please see the history of present illness. Otherwise, review of systems is positive for ***.   All other systems are reviewed and otherwise negative.   PHYSICAL EXAM: *** VS:   There were no vitals taken for this visit. BMI: There is no weight on file to calculate BMI. Well nourished, well developed, in no acute distress HEENT: normocephalic, atraumatic Neck: no JVD, carotid bruits or masses Cardiac:  normal S1, S2; RRR; no significant murmurs, no rubs, or gallops Lungs:  clear to auscultation bilaterally, no wheezing, rhonchi or rales Abd: soft, nontender,  + BS MS: no deformity or atrophy Ext: no edema Skin: warm and dry, no rash Neuro:  No gross deficits appreciated Psych: euthymic mood, full affect  *** PPM/ICD site is stable, no tethering or discomfort   EKG:  Done today shows ***  Recent Labs: 09/20/2015: BUN 18; Creat 0.96; Hemoglobin 11.0*; Platelets 195; Potassium 4.3; Sodium 137  No results found for requested labs within last 365 days.   CrCl cannot be calculated (Unknown ideal weight.).   Wt Readings from Last 3 Encounters:  09/26/15 246 lb (111.585 kg)  09/20/15 246 lb (111.585 kg)  09/06/15 244 lb (110.678 kg)     Other studies reviewed: Additional studies/records reviewed today include: summarized above***  ASSESSMENT AND PLAN:  *** Coumadin or OAC Labs, CHADS *** AAD, labs/EKG, testing needed? *** site check *** remotes *** device info  1. ***  Disposition: F/u with ***  Current medicines are reviewed at length with the patient today.  The patient did not have any concerns regarding medicines.***  Signed, Jennings Books, PA-C 10/18/2015 11:22 AM     [facility] [address] [place] [address] [phone] (office)  [phone] (fax)

## 2015-10-18 NOTE — Progress Notes (Signed)
Cardiology Office Note Date:  10/19/2015  Patient ID:  Jenna Mcdonald, Jenna Mcdonald Mcdonald 18-Dec-1945, MRN [id number] PCP:  Henrine Screws, MD  Cardiologist:  Dr. Radford Pax   Chief Complaint:  f/u   History of Present Illness: Jenna Mcdonald Jenna Mcdonald Mcdonald is a 70 y.o. female with history of PAFib on PRN Rhythmol that she says she has never used, DM, HTN, OSA using BIPAP, morbid obesity, Thalassemia minor, OA comes to the office today for a planned f/u visit.  She was increased onher cardizem to 90mg  TID via Dr. Radford Pax with frequent APCs on her monitor.  She was seen last by myself with c/o last 09/19/15 for Dr. Radford Pax with c/o at that time constant nagging pressure in the center of her chest, non-radiating, not associated with SOB, no N/V, no diaphoresis, no palpitations, but states she thinks she may have had AF, no dizziness, near syncope or syncope.  She mentioned that her son was getting married on Friday and did not want to go to the hospital and risk not making it to [place] for the wedding, by Thursday she was feeling better and has been since then.  She also had c/o in the last few months feeling at the end of the day the tops of her feet are very swollen and uncomfortable.  She has a bad back with chronic pain, and chronic b/l LE pain and sits most of her day with her feet down on the floor.  She also c/o for about 6 months  her fingertips and toes particularly left are numb with pins/needles type of feeling, also she had noted in the last months as well her hair is falling out.  This she mentioned to her PMD last week and he ran thyroid and vitamin blood tests and she was told were OK.  She comes in today seen for Dr. Radford Pax feeling much better.  She saw her sleep specialist and made an adjustment back to her previous mask and with that she is sleeping much better and feels great.  She does not have any ongoing symptoms.  She is pending f/u with them but with this change she feels remarkably improved.  She is  taking the Diltiazem TID as directed by Dr. Radford Pax, though will some days miss or take the mid-day dose late.     Past Medical History  Diagnosis Date  . Diabetes mellitus   . A-fib ([place])   . Obesity   . Hypertension   . Irritable bowel   . Osteoarthritis   . Degenerative disc disease, lumbar     of the knees  . PONV (postoperative nausea and vomiting)   . Pneumonia   . Anemia   . Insomnia   . Thalassemia minor   . 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[place])   . Sleep apnea     bipap  . Gastroesophageal reflux disease     denies  . Breast CA ([place])     left  . Hypercholesteremia   . Vitamin D deficiency   . Low back pain   . Metabolic syndrome   . Osteopenia   . PAF (paroxysmal atrial fibrillation) ([place])   . Sigmoid diverticulosis   . Fatigue     Severe secondary to to gemfibrozil  . Hammertoe     left second toe    Past Surgical History  Procedure Laterality Date  . Replacement total knee Left 11  . Tonsillectomy    . Dilation and curettage of uterus    . Replacement total  knee    . Tubal ligation    . Cystectomy  70    pilonidal   . Oophorectomy      wedge section not rem  . Breast surgery Left 10    lumpectomy  . Eye surgery Bilateral 12/14    cataracts  . Cesarean section    . Maximum access (mas)posterior lumbar interbody fusion (plif) 2 level N/A 09/22/2013    Procedure: FOR MAXIMUM ACCESS  POSTERIOR LUMBAR INTERBODY FUSION LUMBAR THREE-FOUR FOUR-FIVE;  Surgeon: Eustace Moore, MD;  Location: [facility];  Service: Neurosurgery;  Laterality: N/A;    Current Outpatient Prescriptions  Medication Sig Dispense Refill  . acetaminophen (TYLENOL) 500 MG tablet Take 500 mg by mouth every 6 (six) hours as needed for mild pain. For back pain    . Calcium Carbonate-Vitamin D (CALCIUM 600 + D PO) Take 1 tablet by mouth daily.     . cyclobenzaprine (FLEXERIL) 10 MG tablet Take 10 mg by mouth 3 (three) times daily as needed for muscle spasms.    Marland Kitchen diltiazem (CARDIZEM) 90 MG tablet  TAKE 1 TABLET (90 MG TOTAL) BY MOUTH 3 (THREE) TIMES DAILY. 270 tablet 3  . magnesium oxide (MAG-OX) 400 MG tablet Take 400 mg by mouth 3 (three) times a week.     . metFORMIN (GLUCOPHAGE-XR) 500 MG 24 hr tablet Take 1 tablet by mouth 2 (two) times daily.  3  . methocarbamol (ROBAXIN) 500 MG tablet Take 1 tablet (500 mg total) by mouth every 6 (six) hours as needed for muscle spasms. 60 tablet 1  . Multiple Vitamin (MULITIVITAMIN WITH MINERALS) TABS Take 1 tablet by mouth daily.    . propafenone (RYTHMOL) 225 MG tablet Take 225 mg by mouth as needed (for breakthrough a-fib).    . traMADol (ULTRAM) 50 MG tablet Take 50 mg by mouth every 8 (eight) hours as needed for moderate pain.    Marland Kitchen warfarin (COUMADIN) 5 MG tablet Take 5 mg by mouth every evening.     . [DISCONTINUED] flecainide (TAMBOCOR) 50 MG tablet Take 1 tablet (50 mg total) by mouth every 12 (twelve) hours. 60 tablet 12   No current facility-administered medications for this visit.    Allergies:   Aromasin; Femara; Gemfibrozil; Letrozole; Metformin and related; Metoprolol; Other; Tamoxifen; Tetracycline; Tetracyclines & related; Toprol xl; Verapamil; and Vicodin   Social History:  The patient  reports that she quit smoking about 11 years ago. Her smoking use included Cigarettes. She has a 5 pack-year smoking history. She does not have any smokeless tobacco history on file. She reports that she drinks alcohol. She reports that she does not use illicit drugs.   Family History:  The patient's family history includes Anesthesia problems in her mother; Diabetes in her father and mother.  ROS:  Please see the history of present illness.  All other systems are reviewed and otherwise negative.   PHYSICAL EXAM:  VS:  BP 138/60 mmHg  Pulse 94  Ht 5' 4.5" (1.638 m)  Wt 246 lb (111.585 kg)  BMI 41.59 kg/m2 BMI: Body mass index is 41.59 kg/(m^2). Very plesen, moderately obese WF, in no acute distress, no pallor HEENT: normocephalic,  atraumatic Neck: no JVD, carotid bruits or masses Cardiac:  normal S1, S2; RRR; 2/6 SM, no rubs, or gallops Lungs:  clear to auscultation bilaterally, no wheezing, rhonchi or rales Abd: soft, nontender MS: no deformity or atrophy Ext: no edema is appreciated again today, she has 3+ equal radial and  pedal pulses Skin: warm and dry, no rash Neuro:  No gross deficits appreciated Psych: euthymic mood, full affect   EKG:  Done 09/21/15 SR, nonspecific T changes, similar to her previous  09/27/15: 48 hour monitor Study Highlights     Normal sinus rhythm and sinus tachycardia with average heart rate 85bpm. The heart rate ranged from 65-132bpm.  Occasional PVCs  Frequent PACs and nonsutained atrial tachycardia up to 10 beats.    09/27/15 Lexiscan stress Study Highlights     Nuclear stress EF: 76%.  There was no ST segment deviation noted during stress.  The study is normal.  The left ventricular ejection fraction is hyperdynamic (>65%).  Normal stress nuclear study with no ischemia or infarction; EF 76 with normal wall motion.   01/12/15 Echocardiogram: Study Conclusions - Left ventricle: The cavity size was normal. Systolic function was normal. The estimated ejection fraction was in the range of 60% to 65%. Wall motion was normal; there were no regional wall motion abnormalities. - Aortic valve: Mild thickening, consistent with sclerosis. Transvalvular velocity was within the normal range. There was no stenosis. There was no regurgitation. - Mitral valve: Moderate focal calcification of the anterior leaflet (medial segment(s)). There was mild regurgitation.  01/26/14: Lexiscan stress myoview Impression Exercise Capacity: Lexiscan with no exercise. BP Response: Normal blood pressure response. Clinical Symptoms: No significant symptoms noted. ECG Impression: No significant ST segment change suggestive of ischemia. Comparison with Prior Nuclear Study: No  significant change from previous study Overall Impression: Normal stress nuclear study. Atrial fibrillation with RVR.  Recent Labs: 09/20/2015: BUN 18; Creat 0.96; Hemoglobin 11.0*; Platelets 195; Potassium 4.3; Sodium 137   Wt Readings from Last 3 Encounters:  10/19/15 246 lb (111.585 kg)  09/26/15 246 lb (111.585 kg)  09/20/15 246 lb (111.585 kg)     Other studies reviewed: Additional studies/records reviewed today include: summarized above  ASSESSMENT AND PLAN:  1. CP      No known hx of CAD     Remains rsolved     Negative stress myoview 2015 and Feb 2017   2. Paroxysmal AFib     CHADS2 Vasc is at least 3 on coumadin, monitored and managed with her PMD     Denies bleeding/signs of bleeding, she prefers to stay with warfarin rather then an [facility]     Labs stable     palpitations are resolved         She has been instructed to keep adequately hydrated and avoid stimulants,      her last known AF event was June 2016     No changes   3. HTN      Appears well controlled  4. OSA      She reports compliance with BiPAP      Sleeping much better and feeling significantly better with change of her mask  5. Pedal edema     None appreciated today again Sounds of dependent edema, she is instructed to avoid long periods of sitting with her feet down, elevate her feet when possible while sitting  6. Alopecia     improving  Disposition: No changes, she will see Dr. Radford Pax in 1 year, sooner if needed, continue with her PMD and sleep specialist.  Current medicines are reviewed at length with the patient today.  The patient did not have any concerns regarding medicines.  Haywood Lasso, PA-C 10/19/2015 8:32 AM     [facility] [address] [place]  [address] [phone] (office)  [phone] (fax)

## 2015-10-19 ENCOUNTER — Encounter: Payer: Self-pay | Admitting: Physician Assistant

## 2015-10-19 ENCOUNTER — Ambulatory Visit: Payer: Medicare Other | Admitting: Cardiology

## 2015-10-19 ENCOUNTER — Ambulatory Visit (INDEPENDENT_AMBULATORY_CARE_PROVIDER_SITE_OTHER): Payer: Medicare Other | Admitting: Physician Assistant

## 2015-10-19 VITALS — BP 138/60 | HR 94 | Ht 64.5 in | Wt 246.0 lb

## 2015-10-19 DIAGNOSIS — R079 Chest pain, unspecified: Secondary | ICD-10-CM | POA: Diagnosis not present

## 2015-10-19 DIAGNOSIS — I10 Essential (primary) hypertension: Secondary | ICD-10-CM

## 2015-10-19 DIAGNOSIS — I480 Paroxysmal atrial fibrillation: Secondary | ICD-10-CM

## 2015-10-19 DIAGNOSIS — G4730 Sleep apnea, unspecified: Secondary | ICD-10-CM

## 2015-10-19 NOTE — Patient Instructions (Addendum)
Medication Instructions:   Your physician recommends that you continue on your current medications as directed. Please refer to the Current Medication list given to you today.   If you need a refill on your cardiac medications before your next appointment, please call your pharmacy.  Labwork: NONE ORDER TODAY    Testing/Procedures: NONE ORDER TODAY    Follow-Up:  Your physician wants you to follow-up in: [place] will receive a reminder letter in the mail two months in advance. If you don't receive a letter, please call our office to schedule the follow-up appointment.     Any Other Special Instructions Will Be Listed Below (If Applicable).

## 2015-12-04 ENCOUNTER — Other Ambulatory Visit: Payer: Self-pay | Admitting: Internal Medicine

## 2015-12-04 ENCOUNTER — Ambulatory Visit
Admission: RE | Admit: 2015-12-04 | Discharge: 2015-12-04 | Disposition: A | Discharge status: Home / Self Care | Payer: Medicare Other | Source: Ambulatory Visit | Attending: Internal Medicine | Admitting: Internal Medicine

## 2015-12-04 DIAGNOSIS — R0902 Hypoxemia: Secondary | ICD-10-CM

## 2015-12-24 ENCOUNTER — Institutional Professional Consult (permissible substitution): Payer: Medicare Other | Admitting: Pulmonary Disease

## 2016-02-04 ENCOUNTER — Institutional Professional Consult (permissible substitution): Payer: Medicare Other | Admitting: Pulmonary Disease

## 2016-02-04 ENCOUNTER — Encounter: Payer: Self-pay | Admitting: Pulmonary Disease

## 2016-02-04 ENCOUNTER — Ambulatory Visit (INDEPENDENT_AMBULATORY_CARE_PROVIDER_SITE_OTHER): Payer: Medicare Other | Admitting: Pulmonary Disease

## 2016-02-04 VITALS — BP 142/68 | HR 79 | Ht 64.5 in | Wt 244.0 lb

## 2016-02-04 DIAGNOSIS — G4733 Obstructive sleep apnea (adult) (pediatric): Secondary | ICD-10-CM | POA: Diagnosis not present

## 2016-02-04 MED ORDER — ZOLPIDEM TARTRATE 5 MG PO TABS: 5.0000 mg | ORAL_TABLET | Freq: Every evening | ORAL | Status: DC | PRN

## 2016-02-04 NOTE — Progress Notes (Signed)
Subjective:    Patient ID: Jenna Mcdonald, female    DOB: October 29, 1945, 70 y.o.   MRN: [id number]  HPI  This is the case of Jenna Mcdonald, 70 y.o. Female, who was referred by Dr. Marcellus Scott and Dr. Effie Shy in consultation regarding hypoxemia.  As you very well know, patient 20 PY smoking history, quit 25 yrs ago. Not been dxed with asthma or copd.  Pt had hypersomnia, fatigue. PSG done 5 yrs ago showed. Was started on Bipap 4-5 yrs ago. She feels better over all with bipap. No issues now with bipap At the start of year, she had sleep issues. Had ONO which showed O2 desatn. DL was good.  She has had at least 5 ONO and results have been variable as far as O2 desatn is concerned.  Her Bipap DL is good.   Very fxnal at home. Does yardwork. Some SOB with more than ADLs. (-) cough.        Review of Systems  Constitutional: Negative.  Negative for fever and unexpected weight change.  HENT: Negative.  Negative for congestion, dental problem, ear pain, nosebleeds, postnasal drip, rhinorrhea, sinus pressure, sneezing, sore throat and trouble swallowing.   Eyes: Negative.  Negative for redness and itching.  Respiratory: Negative.  Negative for cough, chest tightness, shortness of breath and wheezing.   Cardiovascular: Positive for leg swelling. Negative for palpitations.  Gastrointestinal: Negative.  Negative for nausea and vomiting.  Endocrine: Negative.   Genitourinary: Negative.  Negative for dysuria.  Musculoskeletal: Positive for back pain and arthralgias. Negative for joint swelling.  Skin: Negative.  Negative for rash.  Allergic/Immunologic: Negative.   Neurological: Negative.  Negative for headaches.  Hematological: Negative.  Does not bruise/bleed easily.  Psychiatric/Behavioral: Negative.  Negative for dysphoric mood. The patient is not nervous/anxious.    Past Medical History  Diagnosis Date  . Diabetes mellitus   . A-fib ([place])   . Obesity   . Hypertension     . Irritable bowel   . Osteoarthritis   . Degenerative disc disease, lumbar     of the knees  . PONV (postoperative nausea and vomiting)   . Pneumonia   . Anemia   . Insomnia   . Thalassemia minor   . 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[place])   . Sleep apnea     bipap  . Gastroesophageal reflux disease     denies  . Breast CA ([place])     left  . Hypercholesteremia   . Vitamin D deficiency   . Low back pain   . Metabolic syndrome   . Osteopenia   . PAF (paroxysmal atrial fibrillation) ([place])   . Sigmoid diverticulosis   . Fatigue     Severe secondary to to gemfibrozil  . Hammertoe     left second toe   Breast CA survivor. (-) DVT  Family History  Problem Relation Age of Onset  . Anesthesia problems Mother   . Diabetes Mother   . Diabetes Father      Past Surgical History  Procedure Laterality Date  . Replacement total knee Left 11  . Tonsillectomy    . Dilation and curettage of uterus    . Replacement total knee    . Tubal ligation    . Cystectomy  70    pilonidal   . Oophorectomy      wedge section not rem  . Breast surgery Left 10    lumpectomy  . Eye surgery Bilateral 12/14  cataracts  . Cesarean section    . Maximum access (mas)posterior lumbar interbody fusion (plif) 2 level N/A 09/22/2013    Procedure: FOR MAXIMUM ACCESS  POSTERIOR LUMBAR INTERBODY FUSION LUMBAR THREE-FOUR FOUR-FIVE;  Surgeon: Eustace Moore, MD;  Location: [place] [facility];  Service: Neurosurgery;  Laterality: N/A;    Social History   Social History  . Marital Status: Single    Spouse Name: N/A  . Number of Children: N/A  . Years of Education: N/A   Occupational History  . Not on file.   Social History Main Topics  . Smoking status: Former Smoker -- 0.50 packs/day for 10 years    Types: Cigarettes    Quit date: 11/12/2003  . Smokeless tobacco: Not on file  . Alcohol Use: Yes  . Drug Use: No  . Sexual Activity: No   Other Topics Concern  . Not on file   Social History Narrative   Single  with 2 boys. Worked at [employer]. occl etoh.   Allergies  Allergen Reactions  . Aromasin [Exemestane]     Hair loss   . Femara [Letrozole]     unknown  . Gemfibrozil     Other reaction(s): Other (See Comments) Unknown Severe fatigue   . Letrozole Other (See Comments)    Unknown reactions   . Metformin And Related Diarrhea    500 MG AM and PM SEVERE Diarrhea  . Metoprolol Diarrhea    Fatigue   . Other     Other reaction(s): Other (See Comments) Numerous cancer drugs - unknown reaction  . Tamoxifen     Bone and body aches   . Tetracycline     Other reaction(s): Other (See Comments) Unknown reaction  . Tetracyclines & Related Other (See Comments)    Vaginal infections    . Toprol Xl [Metoprolol Tartrate]     Hair loss  . Verapamil     Other reaction(s): Other (See Comments) Unknown reaction  . Vicodin [Hydrocodone-Acetaminophen] Nausea And Vomiting    Patient reports terrible nausea; plain tylenol is ok with patient     Outpatient Prescriptions Prior to Visit  Medication Sig Dispense Refill  . acetaminophen (TYLENOL) 500 MG tablet Take 500 mg by mouth every 6 (six) hours as needed for mild pain. For back pain    . Calcium Carbonate-Vitamin D (CALCIUM 600 + D PO) Take 1 tablet by mouth daily.     . cyclobenzaprine (FLEXERIL) 10 MG tablet Take 10 mg by mouth 3 (three) times daily as needed for muscle spasms.    Marland Kitchen diltiazem (CARDIZEM) 90 MG tablet TAKE 1 TABLET (90 MG TOTAL) BY MOUTH 3 (THREE) TIMES DAILY. 270 tablet 3  . magnesium oxide (MAG-OX) 400 MG tablet Take 400 mg by mouth 3 (three) times a week.     . metFORMIN (GLUCOPHAGE-XR) 500 MG 24 hr tablet Take 1 tablet by mouth 2 (two) times daily.  3  . Multiple Vitamin (MULITIVITAMIN WITH MINERALS) TABS Take 1 tablet by mouth daily.    Marland Kitchen warfarin (COUMADIN) 5 MG tablet Take 5 mg by mouth every evening.     . propafenone (RYTHMOL) 225 MG tablet Take 225 mg by mouth as needed (for breakthrough a-fib). Reported on 02/04/2016      . traMADol (ULTRAM) 50 MG tablet Take 50 mg by mouth every 8 (eight) hours as needed for moderate pain. Reported on 02/04/2016    . methocarbamol (ROBAXIN) 500 MG tablet Take 1 tablet (500 mg total) by mouth every 6 (  six) hours as needed for muscle spasms. (Patient not taking: Reported on 02/04/2016) 60 tablet 1   No facility-administered medications prior to visit.   Meds ordered this encounter  Medications  . glimepiride (AMARYL) 4 MG tablet    Sig: Take 0.5 tablets by mouth daily.    Refill:  3  . zolpidem (AMBIEN) 5 MG tablet    Sig: Take 1 tablet (5 mg total) by mouth at bedtime as needed for sleep. TAKE ON NIGHT OF SLEEP STUDY    Dispense:  2 tablet    Refill:  0          Objective:   Physical Exam  Vitals:  Filed Vitals:   02/04/16 1552  BP: 142/68  Pulse: 79  Height: 5' 4.5" (1.638 m)  Weight: 244 lb (110.678 kg)  SpO2: 97%    Constitutional/General:  Pleasant, well-nourished, well-developed, not in any distress,  Comfortably seating.  Well kempt  Body mass index is 41.25 kg/(m^2). Wt Readings from Last 3 Encounters:  02/04/16 244 lb (110.678 kg)  10/19/15 246 lb (111.585 kg)  09/26/15 246 lb (111.585 kg)      HEENT: Pupils equal and reactive to light and accommodation. Anicteric sclerae. Normal nasal mucosa.   No oral  lesions,  mouth clear,  oropharynx clear, no postnasal drip. (-) Oral thrush. No dental caries.  Airway - Mallampati class III  Neck: No masses. Midline trachea. No JVD, (-) LAD. (-) bruits appreciated.  Respiratory/Chest: Grossly normal chest. (-) deformity. (-) Accessory muscle use.  Symmetric expansion. (-) Tenderness on palpation.  Resonant on percussion.  Diminished BS on both lower lung zones. (-) wheezing, crackles, rhonchi (-) egophony  Cardiovascular: Regular rate and  rhythm, heart sounds normal, no murmur or gallops, no peripheral edema  Gastrointestinal:  Normal bowel sounds. Soft, non-tender. No hepatosplenomegaly.   (-) masses.   Musculoskeletal:  Normal muscle tone. Normal gait.   Extremities: Grossly normal. (-) clubbing, cyanosis.  (-) edema  Skin: (-) rash,lesions seen.   Neurological/Psychiatric : alert, oriented to time, place, person. Normal mood and affect            Assessment & Plan:  OSA (obstructive sleep apnea) Pt with OSA. Dxed 5 yrs ago. Unsure what severity is.  She has been on bipap at least 5 yrs. Feels better using bipap. More energy, less sleepiness. Recent fatigue at start of 2017. Has had at least 5 ONOs with bipap and O2 sats <89% for more than 5 minutes on all occasions.  Duration ranges from 5 mins to 55 minutes. DL on Bipap good. Bipap min EPAP 7, max IPAP 15, PS 5.  DL in March 2017 > AHI 2.1, 100%.   Plan : 1. Cont bipap with current settings. She is tolerant and feels better with it. More energy. Less sleepiness.  2. Will need a PSG with her own BiPaP to determine need for O2. Pt wanted to have sleep study at Dr. Nancee Liter sleep lab. Plan to use her bipap.  Lab to determine if there will be non apneic hypoxemia.  If there is, they were instructed to add O2 to keep o2 sats > 88%.   3. Pt does NOT want to be on O2 as much as possible.  Will start o2 if with significant o2 desatn on bipap.  If will need O2, pt will need PFT, ABG. May need to R/O OHS.   Morbid obesity ([place]) Weight reduction     Thank you very much for letting  me participate in this patient's care. Please do not hesitate to give me a call if you have any questions or concerns regarding the treatment plan.   Patient will follow up with me in 2-3 months. Pt will call after her sleep study and we will decide on further tests after that.     Monica Becton, MD 02/05/2016   2:37 AM Pulmonary and [place] Pager: [phone] Office: [phone], Fax: [phone]

## 2016-02-04 NOTE — Patient Instructions (Signed)
  It was a pleasure taking care of you today!  You are diagnosed with Obstructive Sleep Apnea or OSA. Cont with Bipap use.  We will order you a sleep study with your own Bipap to determine if you will need O2.    Please make sure you use your CPAP device everytime you sleep.  We will monitor the usage of your machine per your insurance requirement.  Your insurance company may take the machine from you if you are not using it regularly.   Please clean the mask, tubings, filter, water reservoir with soapy water every week.  Please use distilled water for the water reservoir.   Please call the office or your machine provider (DME company) if you are having issues with the device.    Return to clinic after your sleep study. Pls call the office after sleep study.

## 2016-02-05 NOTE — Assessment & Plan Note (Signed)
Weight reduction 

## 2016-02-05 NOTE — Assessment & Plan Note (Addendum)
Pt with OSA. Dxed 5 yrs ago. Unsure what severity is.  She has been on bipap at least 5 yrs. Feels better using bipap. More energy, less sleepiness. Recent fatigue at start of 2017. Has had at least 5 ONOs with bipap and O2 sats <89% for more than 5 minutes on all occasions.  Duration ranges from 5 mins to 55 minutes. DL on Bipap good. Bipap min EPAP 7, max IPAP 15, PS 5.  DL in March 2017 > AHI 2.1, 100%.   Plan : 1. Cont bipap with current settings. She is tolerant and feels better with it. More energy. Less sleepiness.  2. Will need a PSG with her own BiPaP to determine need for O2. Pt wanted to have sleep study at Dr. Nancee Liter sleep lab. Plan to use her bipap.  Lab to determine if there will be non apneic hypoxemia.  If there is, they were instructed to add O2 to keep o2 sats > 88%.   3. Pt does NOT want to be on O2 as much as possible.  Will start o2 if with significant o2 desatn on bipap.  If will need O2, pt will need PFT, ABG. May need to R/O OHS.

## 2016-02-22 ENCOUNTER — Telehealth: Payer: Self-pay | Admitting: *Deleted

## 2016-02-22 NOTE — Telephone Encounter (Signed)
[phone] ID# [id number] ambien 5 mg #2 tabs 1 tab night of sleep study.  Called and initiated PA over the phone.  This is going into clinical review Ref # [id number]

## 2016-02-25 ENCOUNTER — Telehealth: Payer: Self-pay | Admitting: Pulmonary Disease

## 2016-02-25 NOTE — Telephone Encounter (Signed)
Called spoke with pt. She has sleep study done 6/29. FYI for Dr. Corrie Dandy.

## 2016-02-25 NOTE — Telephone Encounter (Signed)
[id number] x1 

## 2016-02-26 NOTE — Telephone Encounter (Signed)
pls tell pt I was out last week and have been working daily since Saturday.  Sorry for that.   Will try to read her sleep study this Thursday.   AD

## 2016-02-27 NOTE — Telephone Encounter (Signed)
Spoke with pt. She is aware that AD will read her sleep study tomorrow while he is in the office. Pt was appreciative and verbalized understanding. Nothing further was needed at this time.

## 2016-02-28 NOTE — Telephone Encounter (Signed)
Do you know what insurance covers?  Zaleplon?  Eszopiclone?  Rozerem?   Thanks.  AD

## 2016-02-28 NOTE — Telephone Encounter (Signed)
I spoke with the pt  She states that she got her ambien rx from [facility] for 4 dollars  She has already taken med, as this was just for her sleep study  Nothing further needed

## 2016-02-28 NOTE — Telephone Encounter (Signed)
PA for Ambien has been denied. Is there anything else you can prescribe?  Thanks!

## 2016-02-29 NOTE — Telephone Encounter (Signed)
   Pt had her cpap/bipap study done at the [facility] last week. Can you pls follow up on the report? Let me know what the lab says.   Thank you.  AD

## 2016-03-06 ENCOUNTER — Telehealth: Payer: Self-pay | Admitting: Pulmonary Disease

## 2016-03-06 NOTE — Telephone Encounter (Signed)
   Pt had a bipap titration study to determine if O2 is needed.   Sleep study report reviewed. Done on 03/04/16 over at [facility].  No significant o2 desaturation while on Bipap. o2 sats were 88% or below for 2 mins on RA and Bipap.   Pt did OK on Bipap 12-14/8-10 cm water.   Cont current settings. She is on autobipap.  Pt updated of results.  No need for o2.   AD

## 2016-03-12 ENCOUNTER — Telehealth: Payer: Self-pay | Admitting: Pulmonary Disease

## 2016-03-12 NOTE — Telephone Encounter (Signed)
Spoke with pt and she states that she has not talked to [facility] about her sleep study results. Per chart he spoke with her 03/06/16. She states that "there is no way he already had the sleep study results because I did not get them from Dr. Maxwell Caul until July 11th". I attempted to explain to the pt that we likely had a faxed copy of her results and that the 13th was AFTER the 11th so we very well may have had the results. I reviewed AD's message and recommendations. She states that Dr. Maxwell Caul changed her BiPAP settings to 14/10. She would like to know if AD thinks this is ok.   AD - Please advise as to pressure change settings, and as to sleep study results since pt claims she was not informed. Thanks!

## 2016-03-14 NOTE — Telephone Encounter (Signed)
AD please advise.  

## 2016-03-16 NOTE — Telephone Encounter (Signed)
   Pls call the pt and tell her I reviewed the report of her sleep study and pls apologize as I was out last week and was not able to call her again sooner.   I spoke with her 1-2 weeks ago and during that time I did NOT have her sleep study.  Study was done to determine if she needed O2 with her bipap and we concluded that she did not need O2 based on the study aince she did not wear O2 during the study.   As far as her bipap settings are concerned,  personally, I was OK with her settings when I saw her in the office back in June. She was tolerating the settings well and her download was good. She can revert to those settings if she can NOT tolerate 14/10 cm water.   Thanks.  pls let me know if she has other questions.   AD

## 2016-03-17 ENCOUNTER — Encounter: Payer: Self-pay | Admitting: Pulmonary Disease

## 2016-03-17 NOTE — Telephone Encounter (Signed)
LVM for pt to return call

## 2016-03-18 NOTE — Telephone Encounter (Signed)
lmomtcb x2 for pt 

## 2016-03-19 NOTE — Telephone Encounter (Signed)
PT callng back. Says she was talking to someone and got disconnected.

## 2016-03-19 NOTE — Telephone Encounter (Signed)
lmtcb x1 for pt. 

## 2016-03-19 NOTE — Telephone Encounter (Signed)
lmtcb x3 for pt. 

## 2016-03-19 NOTE — Telephone Encounter (Signed)
Attempted to contact pt. No answer, no option to leave a message. Will try back.  

## 2016-03-19 NOTE — Telephone Encounter (Signed)
Patient returned call, please call back at [phone].

## 2016-03-20 NOTE — Telephone Encounter (Signed)
Called and spoke with pt and she is aware of AD recs.  Nothing further is needed.

## 2016-04-07 ENCOUNTER — Other Ambulatory Visit: Payer: Self-pay | Admitting: Internal Medicine

## 2016-04-07 DIAGNOSIS — Z853 Personal history of malignant neoplasm of breast: Secondary | ICD-10-CM

## 2016-05-15 ENCOUNTER — Ambulatory Visit
Admission: RE | Admit: 2016-05-15 | Discharge: 2016-05-15 | Disposition: A | Discharge status: Home / Self Care | Payer: Medicare Other | Source: Ambulatory Visit | Attending: Internal Medicine | Admitting: Internal Medicine

## 2016-05-15 ENCOUNTER — Other Ambulatory Visit: Payer: Self-pay | Admitting: Internal Medicine

## 2016-05-15 DIAGNOSIS — N6310 Unspecified lump in the right breast, unspecified quadrant: Secondary | ICD-10-CM

## 2016-05-15 DIAGNOSIS — Z853 Personal history of malignant neoplasm of breast: Secondary | ICD-10-CM

## 2016-05-22 IMAGING — CT CT TEMPORAL BONES W/O CM
4 of 6 series · 18 of 30 positions shown, 19 images · non-contrast
Comparison: CT head 09/11/2011

CLINICAL DATA: Acute serous otitis media left ear. History breast
cancer

EXAM:
CT TEMPORAL BONES WITHOUT CONTRAST
TECHNIQUE: Axial and coronal plane CT imaging of the petrous temporal bones was
performed with thin-collimation image reconstruction. No intravenous
contrast was administered. Multiplanar CT image reconstructions were
also generated.

[Series 3: ax mag right · axial · 0.20mm/px · z∈[-20,+1]mm · 2 of 203 slices shown]
[im 68/203  bone]
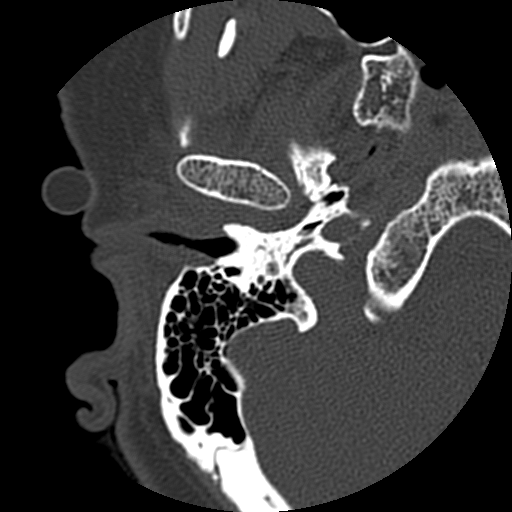
[im 135/203  bone]
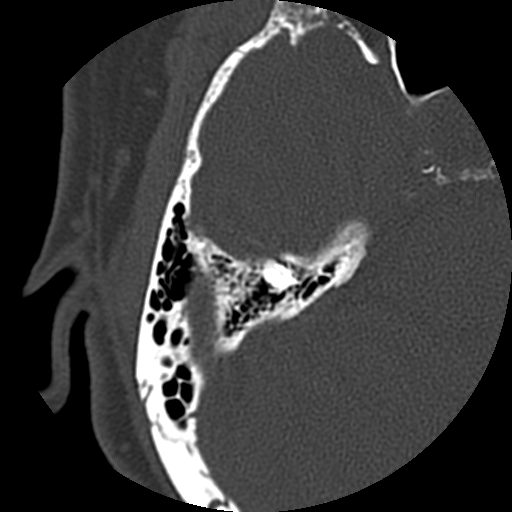

[Series 4: ax mag left · axial · 0.20mm/px · z∈[-25,+6]mm · 3 of 203 slices shown, 4 images]
[im 51/203  brain]
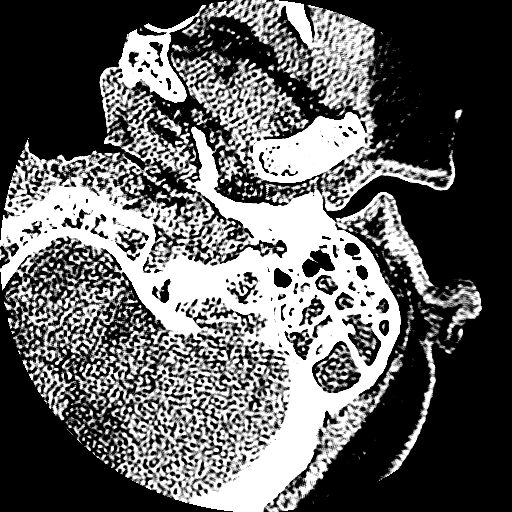
[im 51/203  bone]
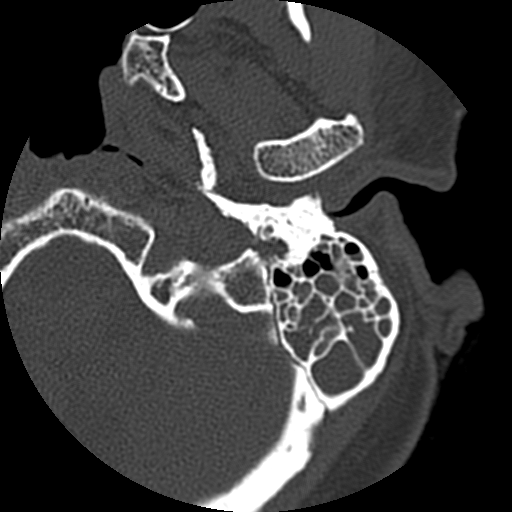
[im 102/203  bone]
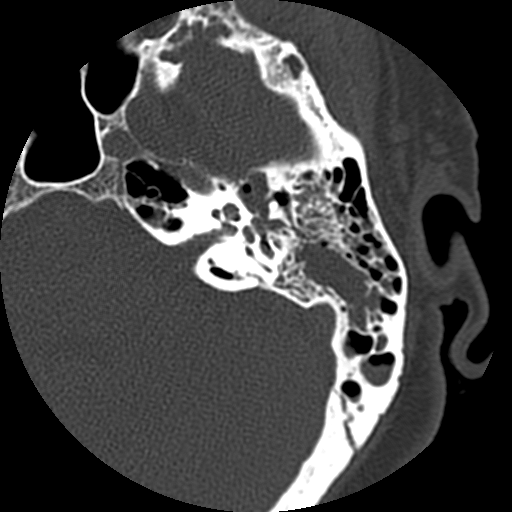
[im 152/203  bone]
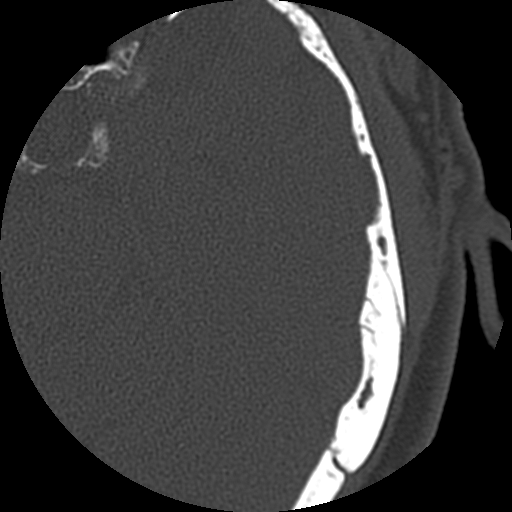

[Series 300: right cor mag · coronal · 0.20mm/px · 6 of 350 slices shown]
[im 50/350  bone]
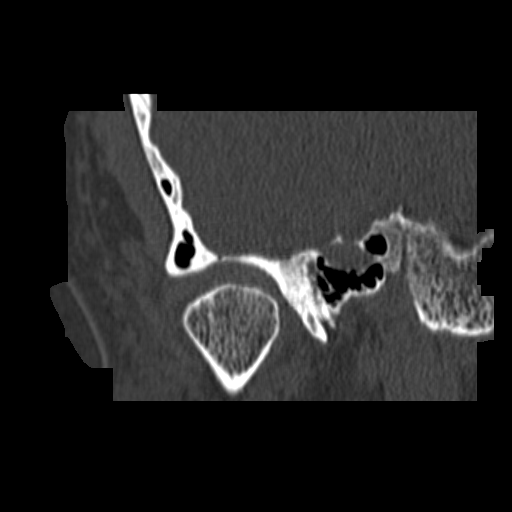
[im 100/350  bone]
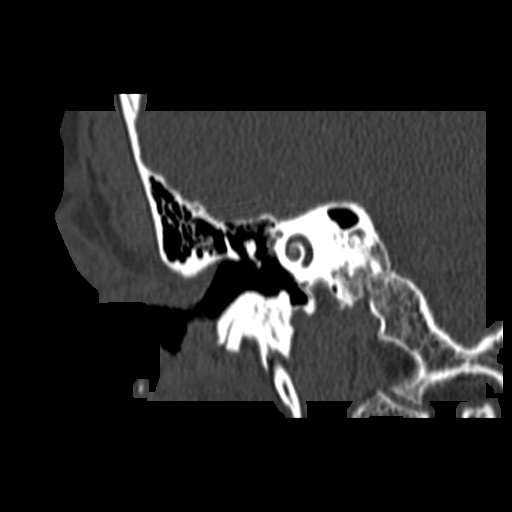
[im 150/350  bone]
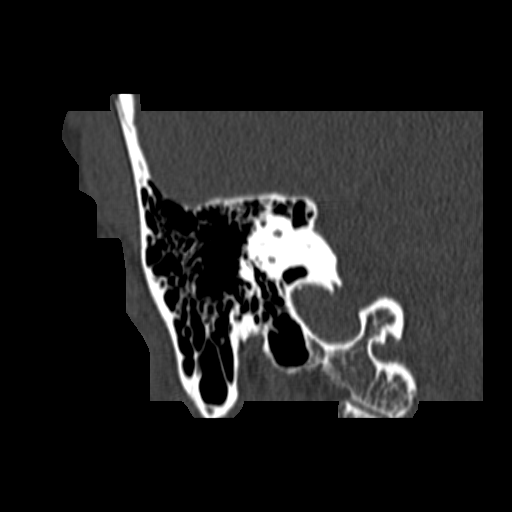
[im 200/350  bone]
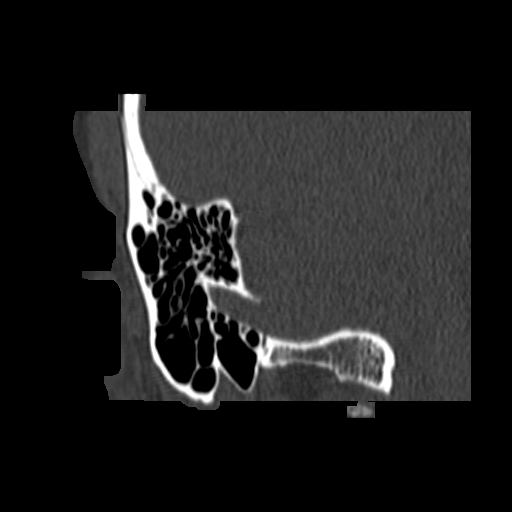
[im 250/350  bone]
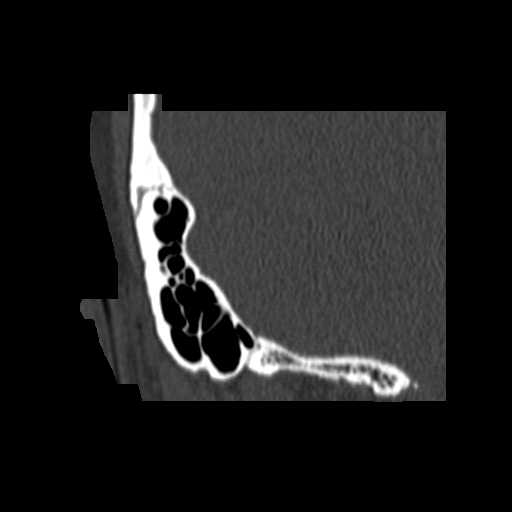
[im 300/350  bone]
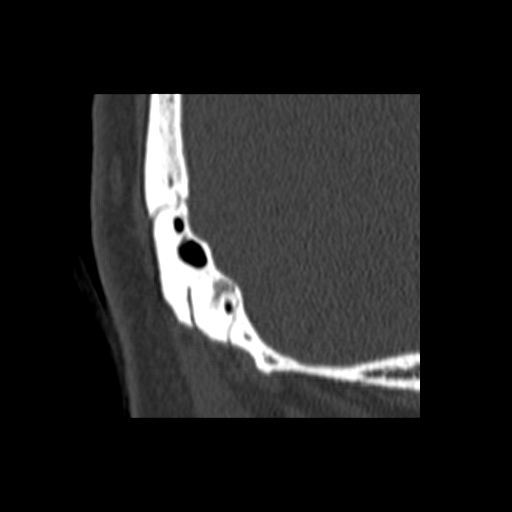

[Series 400: left mag cor · coronal · 0.20mm/px · 7 of 369 slices shown]
[im 47/369  bone]
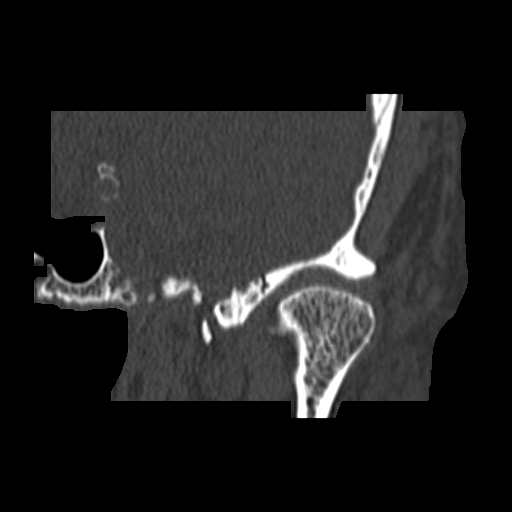
[im 93/369  bone]
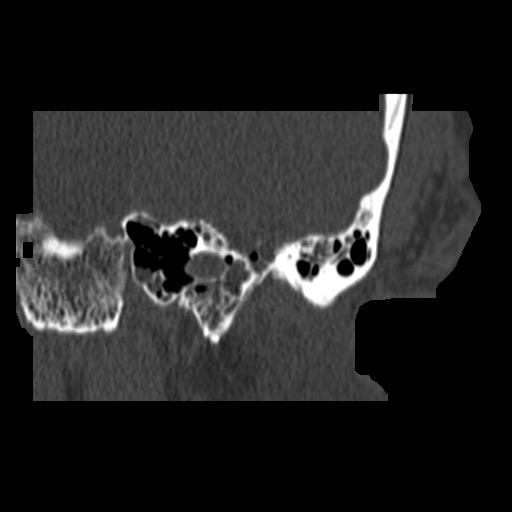
[im 139/369  bone]
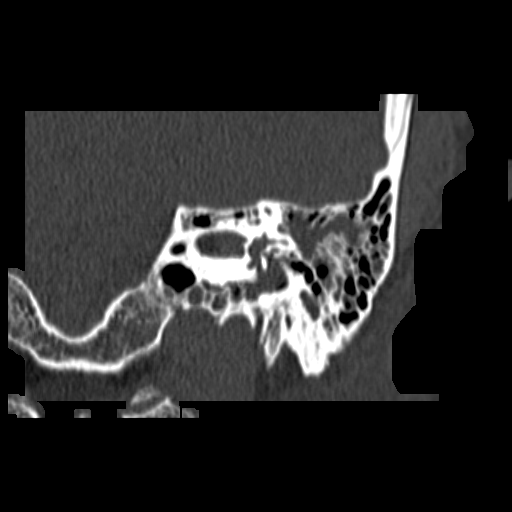
[im 185/369  bone]
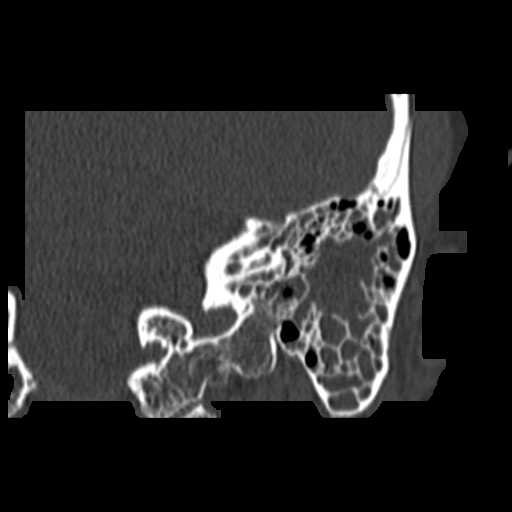
[im 231/369  bone]
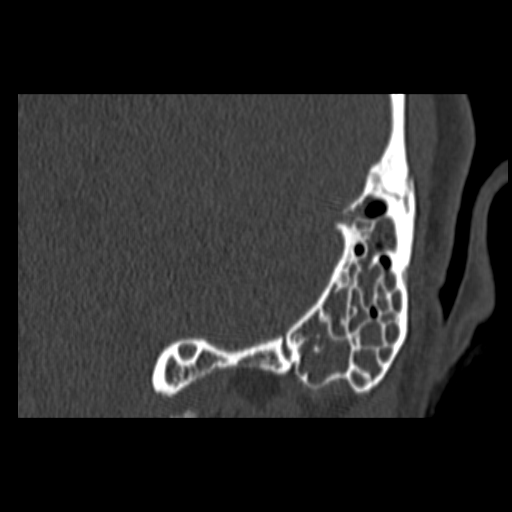
[im 277/369  bone]
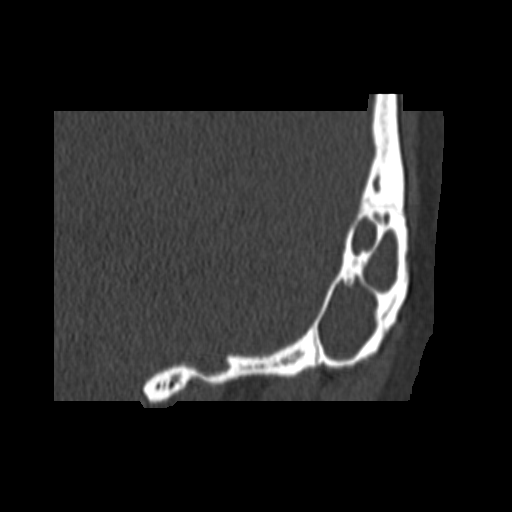
[im 323/369  bone]
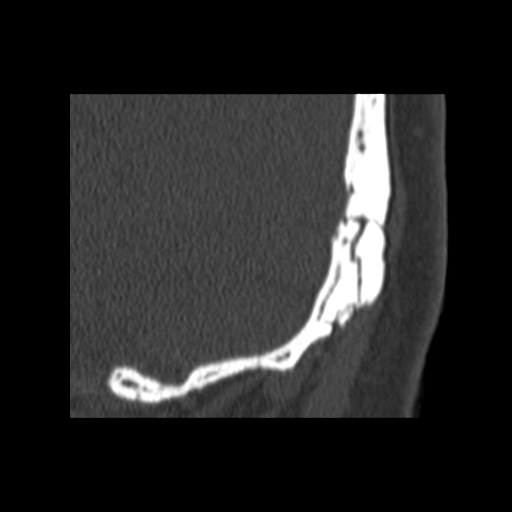

[18 of 30 positions shown; findings below may reference images not displayed]

FINDINGS: Visualized intracranial contents are negative. Visualized paranasal
sinuses are clear.

Right temporal bone: Right mastoid sinus well developed. The mastoid
sinus is mostly clear with an air-fluid level in a posterior
inferior medial air cell. Middle ear is well aerated and clear.
Tympanic membrane non thickened. Ossicles normal. Inner ear
structures are normal. Petrous apex is partially pneumatized and
appears normal.

Left temporal bone: Large left mastoid sinus and middle ear
effusion. Scattered air-fluid levels in the mastoid sinus. No
coalescence or bony destruction. Ossicles are surrounded by fluid
but appear normal without erosion or displacement. Petrous apex is
well pneumatized with scattered air-fluid levels. Inner ear
structures are normal. No obstructing mass in the nasopharynx is
identified.
IMPRESSION: Right mastoid sinus is clear aside from a solitary air-fluid level.
Right middle ear is clear

Large left mastoid sinus effusion and middle ear effusion. Ossicles
intact. There is fluid in the petrous apex. No bony coalescence or
destruction. No underlying mass or cholesteatoma. No obstructing
mass in the posterior nasopharynx.

## 2016-09-08 ENCOUNTER — Encounter: Payer: Self-pay | Admitting: Cardiology

## 2016-09-08 NOTE — Progress Notes (Signed)
Cardiology Office Note    Date:  09/09/2016   ID:  Jenna Mcdonald, DOB 1945/08/29, MRN [id number]  PCP:  Henrine Screws, MD  Cardiologist:  Fransico Him, MD   Chief Complaint  Patient presents with  . Sleep Apnea  . Hypertension  . Atrial Fibrillation    History of Present Illness:  Jenna Mcdonald is a 71 y.o. female with a history of OSA on BiPAP, HTN, PAF and morbid obesity who presents today for followup.  She is doing well with her BiPAP device. She tolerates the nasal pillow mask and feels the pressure is adequate. She sleeps on her side.  She does not snore.  She does not feel rested in the am and feels tired during the day but has not been sleeping well at night due to back pain and pain in her right hand.  She denies any breakthrough of PAF. She has not had to take any Rhythmol.She denies any chest pain or pressure. Since losing 22lbs her SOB and DOE have resolved. She has not noted any  LE edema. She denies any dizziness, palpitations or syncope.     Past Medical History:  Diagnosis Date  . Anemia   . Breast CA ([place])    left  . Degenerative disc disease, lumbar    of the knees  . Diabetes mellitus   . 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[place])   . Fatigue    Severe secondary to to gemfibrozil  . Gastroesophageal reflux disease    denies  . Hammertoe    left second toe  . Heart murmur 09/09/2016  . Hypercholesteremia   . Hypertension   . Insomnia   . Irritable bowel   . Low back pain   . Metabolic syndrome   . Obesity   . OSA on CPAP \   bipap  . Osteoarthritis   . Osteopenia   . Persistent atrial fibrillation ([place])   . Pneumonia   . PONV (postoperative nausea and vomiting)   . Sigmoid diverticulosis   . Thalassemia minor   . Vitamin D deficiency     Past Surgical History:  Procedure Laterality Date  . BREAST SURGERY Left 10   lumpectomy  . CESAREAN SECTION    . CYSTECTOMY  70   pilonidal   . DILATION AND CURETTAGE OF UTERUS    . EYE  SURGERY Bilateral 12/14   cataracts  . MAXIMUM ACCESS (MAS)POSTERIOR LUMBAR INTERBODY FUSION (PLIF) 2 LEVEL N/A 09/22/2013   Procedure: FOR MAXIMUM ACCESS  POSTERIOR LUMBAR INTERBODY FUSION LUMBAR THREE-FOUR FOUR-FIVE;  Surgeon: Eustace Moore, MD;  Location: [facility];  Service: Neurosurgery;  Laterality: N/A;  . OOPHORECTOMY     wedge section not rem  . 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 REPLACEMENT TOTAL KNEE    . TONSILLECTOMY    . TUBAL LIGATION      Current Medications: Outpatient Medications Prior to Visit  Medication Sig Dispense Refill  . acetaminophen (TYLENOL) 500 MG tablet Take 500 mg by mouth every 6 (six) hours as needed for mild pain. For back pain    . Calcium Carbonate-Vitamin D (CALCIUM 600 + D PO) Take 1 tablet by mouth daily.     . cyclobenzaprine (FLEXERIL) 10 MG tablet Take 10 mg by mouth 2 (two) times daily as needed for muscle spasms.     Marland Kitchen diltiazem (CARDIZEM) 90 MG tablet TAKE 1 TABLET (90 MG TOTAL) BY MOUTH 3 (THREE) TIMES DAILY. 270 tablet 3  .  glimepiride (AMARYL) 4 MG tablet Take 0.5 tablets by mouth daily.  3  . magnesium oxide (MAG-OX) 400 MG tablet Take 400 mg by mouth 3 (three) times a week.     . metFORMIN (GLUCOPHAGE-XR) 500 MG 24 hr tablet Take 1 tablet by mouth 2 (two) times daily.  3  . Multiple Vitamin (MULITIVITAMIN WITH MINERALS) TABS Take 1 tablet by mouth daily.    . propafenone (RYTHMOL) 225 MG tablet Take 225 mg by mouth as needed (for breakthrough a-fib). Reported on 02/04/2016    . traMADol (ULTRAM) 50 MG tablet Take 50 mg by mouth every 8 (eight) hours as needed for moderate pain. Reported on 02/04/2016    . warfarin (COUMADIN) 5 MG tablet Take 5 mg by mouth every evening. 7mg  Sunday, Tuesday, Thursday. 5mg  Monday, Wednesday, Friday, Saturday.    . zolpidem (AMBIEN) 5 MG tablet Take 1 tablet (5 mg total) by mouth at bedtime as needed for sleep. TAKE ON NIGHT OF SLEEP STUDY 2 tablet 0   No facility-administered medications prior to visit.        Allergies:   Aromasin [exemestane]; Femara [letrozole]; Gemfibrozil; Letrozole; Metoprolol; Other; Tamoxifen; Tetracycline; Tetracyclines & related; Toprol xl [metoprolol tartrate]; Verapamil; and Vicodin [hydrocodone-acetaminophen]   Social History   Social History  . Marital status: Single    Spouse name: N/A  . Number of children: N/A  . Years of education: N/A   Social History Main Topics  . Smoking status: Former Smoker    Packs/day: 0.50    Years: 10.00    Types: Cigarettes    Quit date: 11/12/2003  . Smokeless tobacco: None  . Alcohol use Yes  . Drug use: No  . Sexual activity: No   Other Topics Concern  . None   Social History Narrative  . None     Family History:  The patient's family history includes Anesthesia problems in her mother; Diabetes in her father and mother.   ROS:   Please see the history of present illness.    ROS All other systems reviewed and are negative.  No flowsheet data found.     PHYSICAL EXAM:   VS:  BP (!) 116/54   Pulse 68   Ht 5' 4.5" (1.638 m)   Wt 222 lb (100.7 kg)   SpO2 98%   BMI 37.52 kg/m    GEN: Well nourished, well developed, in no acute distress  HEENT: normal  Neck: no JVD, carotid bruits, or masses Cardiac: RRR; no  rubs, or gallops,no edema.  Intact distal pulses bilaterally. 2/6 SM at RUSB to LUSB Respiratory:  clear to auscultation bilaterally, normal work of breathing GI: soft, nontender, nondistended, + BS MS: no deformity or atrophy  Skin: warm and dry, no rash Neuro:  Alert and Oriented x 3, Strength and sensation are intact Psych: euthymic mood, full affect  Wt Readings from Last 3 Encounters:  09/09/16 222 lb (100.7 kg)  02/04/16 244 lb (110.7 kg)  10/19/15 246 lb (111.6 kg)      Studies/Labs Reviewed:   EKG:  EKG is not  ordered today.    Recent Labs: 09/20/2015: BUN 18; Creat 0.96; Hemoglobin 11.0; Platelets 195; Potassium 4.3; Sodium 137   Lipid Panel    Component Value Date/Time    CHOL  05/14/2008 0435    146        ATP III CLASSIFICATION:  <200     mg/dL   Desirable  200-239  mg/dL   Borderline High  >=  240    mg/dL   High   TRIG 105 05/14/2008 0435   HDL 27 (L) 05/14/2008 0435   CHOLHDL 5.4 05/14/2008 0435   VLDL 21 05/14/2008 0435   LDLCALC  05/14/2008 0435    98        Total Cholesterol/HDL:CHD Risk Coronary Heart Disease Risk Table                     Men   Women  1/2 Average Risk   3.4   3.3    Additional studies/ records that were reviewed today include:  CPAP download    ASSESSMENT:    1. Persistent atrial fibrillation ([place])   2. Essential hypertension   3. OSA (obstructive sleep apnea)   4. Heart murmur      PLAN:  In order of problems listed above:  1.  Persistent atrial fibrillation maintaining NSR on Cardizem.  She has not had to take any RHythmol for breakthrough palpitations.  She will continue on short acting cardizem TID and PRN Rhythmol.  Continue warfarin for CHADS2VASC score of 4.   2.  HTN - BP controlled on current meds.  Continue Cardizem   3.  OSA - the patient is tolerating PAP therapy well without any problems. The PAP download was reviewed today and showed an AHI of 1.8/hr on 14/10 cm H2O with 98% compliance in using more than 4 hours nightly.  The patient has been using and benefiting from CPAP use and will continue to benefit from therapy.  4.  Obesity - she has lost 22lbs since last June and I congratulated her. I encouraged her to continue in her exercise and diet regimen. 5.  Heart murmur - her last echo was 2 years ago and showed AV sclerosis but no stenosis.  I will repeat an echo to make sure she had not developed stenosis.    Medication Adjustments/Labs and Tests Ordered: Current medicines are reviewed at length with the patient today.  Concerns regarding medicines are outlined above.  Medication changes, Labs and Tests ordered today are listed in the Patient Instructions below.  There are no Patient Instructions  on file for this visit.   Signed, Fransico Him, MD  09/09/2016 9:54 AM    [facility] [address], [place], [place]  [address] Phone: [phone]; Fax: [phone]

## 2016-09-09 ENCOUNTER — Encounter: Payer: Self-pay | Admitting: Cardiology

## 2016-09-09 ENCOUNTER — Ambulatory Visit (INDEPENDENT_AMBULATORY_CARE_PROVIDER_SITE_OTHER): Payer: Medicare Other | Admitting: Cardiology

## 2016-09-09 VITALS — BP 116/54 | HR 68 | Ht 64.5 in | Wt 222.0 lb

## 2016-09-09 DIAGNOSIS — I481 Persistent atrial fibrillation: Secondary | ICD-10-CM

## 2016-09-09 DIAGNOSIS — I10 Essential (primary) hypertension: Secondary | ICD-10-CM | POA: Diagnosis not present

## 2016-09-09 DIAGNOSIS — G4733 Obstructive sleep apnea (adult) (pediatric): Secondary | ICD-10-CM | POA: Diagnosis not present

## 2016-09-09 DIAGNOSIS — R011 Cardiac murmur, unspecified: Secondary | ICD-10-CM | POA: Diagnosis not present

## 2016-09-09 DIAGNOSIS — I4819 Other persistent atrial fibrillation: Secondary | ICD-10-CM

## 2016-09-09 NOTE — Patient Instructions (Signed)

## 2016-09-17 ENCOUNTER — Encounter: Payer: Self-pay | Admitting: Cardiology

## 2016-09-24 ENCOUNTER — Encounter: Payer: Self-pay | Admitting: Cardiology

## 2016-09-24 ENCOUNTER — Ambulatory Visit (HOSPITAL_COMMUNITY): Payer: Medicare Other | Attending: Cardiovascular Disease

## 2016-09-24 ENCOUNTER — Other Ambulatory Visit: Payer: Self-pay

## 2016-09-24 DIAGNOSIS — Z6837 Body mass index (BMI) 37.0-37.9, adult: Secondary | ICD-10-CM | POA: Insufficient documentation

## 2016-09-24 DIAGNOSIS — R011 Cardiac murmur, unspecified: Secondary | ICD-10-CM

## 2016-09-24 DIAGNOSIS — E119 Type 2 diabetes mellitus without complications: Secondary | ICD-10-CM | POA: Insufficient documentation

## 2016-09-24 DIAGNOSIS — I082 Rheumatic disorders of both aortic and tricuspid valves: Secondary | ICD-10-CM | POA: Diagnosis not present

## 2016-09-24 DIAGNOSIS — G4733 Obstructive sleep apnea (adult) (pediatric): Secondary | ICD-10-CM | POA: Diagnosis not present

## 2016-09-24 DIAGNOSIS — E669 Obesity, unspecified: Secondary | ICD-10-CM | POA: Insufficient documentation

## 2016-09-24 DIAGNOSIS — E7800 Pure hypercholesterolemia, unspecified: Secondary | ICD-10-CM | POA: Diagnosis not present

## 2016-09-24 DIAGNOSIS — I350 Nonrheumatic aortic (valve) stenosis: Secondary | ICD-10-CM | POA: Insufficient documentation

## 2016-09-24 DIAGNOSIS — I10 Essential (primary) hypertension: Secondary | ICD-10-CM | POA: Insufficient documentation

## 2016-09-24 DIAGNOSIS — I481 Persistent atrial fibrillation: Secondary | ICD-10-CM | POA: Insufficient documentation

## 2016-09-29 ENCOUNTER — Telehealth: Payer: Self-pay

## 2016-09-29 ENCOUNTER — Telehealth: Payer: Self-pay | Admitting: Cardiology

## 2016-09-29 NOTE — Telephone Encounter (Signed)
-----   Message from Sueanne Margarita, MD sent at 09/24/2016 12:56 PM EST ----- Echo showed normal LVF with moderate LVH,very  mild AS, moderately enlarged LA and mildly calcified MV

## 2016-09-29 NOTE — Telephone Encounter (Signed)
Informed patient of results and verbal understanding expressed.  

## 2016-09-29 NOTE — Telephone Encounter (Signed)
Mrs. Shores is calling to get her echo results from last week . Please call

## 2016-09-29 NOTE — Telephone Encounter (Signed)
Surgical clearance received for [place] for right wrist CTR with date pending cardiac clearance.  Patient understands Coumadin instructions will have to be obtained from Dr. Inda Merlin as his office manages the medication.  Clearance to be faxed to: [facility] - Dr. Amedeo Plenty Fax: [phone] attn Orson Slick

## 2016-09-29 NOTE — Telephone Encounter (Signed)
Follow up  Pt is calling to see if we've received a fax for a surgical clearance. Please call

## 2016-09-30 NOTE — Telephone Encounter (Signed)
Forwarded to Dr. Amedeo Plenty.   Fax# [phone] Attn: Judeen Hammans WIlls

## 2016-09-30 NOTE — Telephone Encounter (Signed)
Patient is low risk from a cardiac standpoint for surgery

## 2016-10-30 ENCOUNTER — Other Ambulatory Visit: Payer: Self-pay | Admitting: Internal Medicine

## 2016-10-30 DIAGNOSIS — N63 Unspecified lump in unspecified breast: Secondary | ICD-10-CM

## 2016-11-13 ENCOUNTER — Ambulatory Visit
Admission: RE | Admit: 2016-11-13 | Discharge: 2016-11-13 | Disposition: A | Discharge status: Home / Self Care | Payer: Medicare Other | Source: Ambulatory Visit | Attending: Internal Medicine | Admitting: Internal Medicine

## 2016-11-13 DIAGNOSIS — N63 Unspecified lump in unspecified breast: Secondary | ICD-10-CM

## 2016-11-13 HISTORY — DX: Personal history of irradiation: Z92.3

## 2017-01-22 ENCOUNTER — Ambulatory Visit
Admission: RE | Admit: 2017-01-22 | Discharge: 2017-01-22 | Disposition: A | Discharge status: Home / Self Care | Payer: Medicare Other | Source: Ambulatory Visit | Attending: Nurse Practitioner | Admitting: Nurse Practitioner

## 2017-01-22 ENCOUNTER — Other Ambulatory Visit: Payer: Self-pay | Admitting: Nurse Practitioner

## 2017-01-22 DIAGNOSIS — R1032 Left lower quadrant pain: Secondary | ICD-10-CM

## 2017-04-09 ENCOUNTER — Other Ambulatory Visit: Payer: Self-pay | Admitting: Pediatric Surgery

## 2017-04-09 DIAGNOSIS — N63 Unspecified lump in unspecified breast: Secondary | ICD-10-CM

## 2017-05-18 ENCOUNTER — Other Ambulatory Visit: Payer: Self-pay | Admitting: Internal Medicine

## 2017-05-18 ENCOUNTER — Ambulatory Visit
Admission: RE | Admit: 2017-05-18 | Discharge: 2017-05-18 | Disposition: A | Discharge status: Home / Self Care | Payer: Medicare Other | Source: Ambulatory Visit | Attending: Pediatric Surgery | Admitting: Pediatric Surgery

## 2017-05-18 DIAGNOSIS — N63 Unspecified lump in unspecified breast: Secondary | ICD-10-CM

## 2017-06-29 ENCOUNTER — Telehealth: Payer: Self-pay | Admitting: Cardiology

## 2017-06-29 DIAGNOSIS — G4733 Obstructive sleep apnea (adult) (pediatric): Secondary | ICD-10-CM

## 2017-06-29 NOTE — Telephone Encounter (Signed)
Will forward request to CPAP coordinator, Gershon Cull, for further follow-up with the pt.

## 2017-06-29 NOTE — Telephone Encounter (Signed)
New message  Pt cal requesting to speak with RN about changing her machine headgear. Please call back to discuss

## 2017-06-30 NOTE — Telephone Encounter (Signed)
Message sent to Dr Radford Pax for advisement:  Patient called today stating Jenna Mcdonald needs a new Prescription for a [place]. The one she has is tearing out her hair on the side of her head.  Thanks,  Gae Bon

## 2017-07-01 NOTE — Telephone Encounter (Signed)
Per Dr Idamae Lusher Loop head gear ordered and faxed to [facility]. Patient will be notified that her insurance does not cover this supply and that she will have to private pay $30 for this item.  Patient has been notified of the cost and she has decided to call her insurance carrier and get back to me.

## 2017-07-07 ENCOUNTER — Other Ambulatory Visit: Payer: Self-pay | Admitting: *Deleted

## 2017-09-02 ENCOUNTER — Encounter: Payer: Self-pay | Admitting: Cardiology

## 2017-09-03 ENCOUNTER — Telehealth: Payer: Self-pay | Admitting: Cardiology

## 2017-09-03 DIAGNOSIS — G4733 Obstructive sleep apnea (adult) (pediatric): Secondary | ICD-10-CM

## 2017-09-03 NOTE — Telephone Encounter (Signed)
Patient calling and wants to see if she can make changes to the pressures of her BIPAP machine. Made patient aware that the information would be forwarded to the Sleep Coordinator. Patient verbalized understanding.

## 2017-09-03 NOTE — Telephone Encounter (Signed)
New Message    Patient is requesting a callback to discuss lowering the pressure of her bypap machine. Please call

## 2017-09-04 ENCOUNTER — Telehealth: Payer: Self-pay | Admitting: *Deleted

## 2017-09-04 NOTE — Telephone Encounter (Signed)
Patient called stating she has spinal fluid on her brain and she feels her pressure on her CPAP is too strong and is adding to her discomfort. Pressure is set on 10-14. I have down loaded her chip and will have it scanned in for your viewing and I will be awaiting your direction.

## 2017-09-07 NOTE — Telephone Encounter (Signed)
A message has been sent to Dr Radford Pax asking for a lower pressure for the patient. Awaiting Dr Landis Gandy response.

## 2017-09-11 ENCOUNTER — Telehealth: Payer: Self-pay | Admitting: *Deleted

## 2017-09-11 DIAGNOSIS — G4733 Obstructive sleep apnea (adult) (pediatric): Secondary | ICD-10-CM

## 2017-09-11 NOTE — Telephone Encounter (Signed)
Please get a BiPAP autotitration from 4 to 18cm H2O. [facility] notified of new order. Patient notified of change.

## 2017-09-11 NOTE — Telephone Encounter (Signed)
Jenna Margarita, MD    Please get a BiPAP autotitration from 4 to 18cm H2O

## 2017-09-11 NOTE — Addendum Note (Signed)
Addended by: Freada Bergeron on: 09/11/2017 05:42 PM   Modules accepted: Orders

## 2017-09-11 NOTE — Telephone Encounter (Signed)
Order sent to [facility] today. Patient notified.

## 2017-09-11 NOTE — Telephone Encounter (Signed)
Please get a BiPAP autotitration from 4 to 18cm H2O

## 2017-09-14 ENCOUNTER — Telehealth: Payer: Self-pay | Admitting: Cardiology

## 2017-09-14 DIAGNOSIS — G4733 Obstructive sleep apnea (adult) (pediatric): Secondary | ICD-10-CM

## 2017-09-14 NOTE — Telephone Encounter (Signed)
Patient states she is still on BiPAP. Download reads IPAP 14,  EPAP 10.

## 2017-09-14 NOTE — Telephone Encounter (Signed)
New Message   Patient is insisting on speaking with nurse about her CPAP machine. She has been speaking with Gae Bon but she does not want to speak with her anymore. She wants to know when they instructions will be sent to [facility]. Please call.

## 2017-09-14 NOTE — Telephone Encounter (Addendum)
  [facility] Barbaraann Rondo) says they can not perform a BiPAP autotitration with CPAP pressures. Please advise

## 2017-09-14 NOTE — Telephone Encounter (Signed)
Unless someonw changed settings she was on BiPAP a year ago

## 2017-09-14 NOTE — Telephone Encounter (Signed)
Spoke with patient in regards to her CPAP machine settings adjustments which were supposed to be sent to [facility].  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

## 2017-09-14 NOTE — Telephone Encounter (Signed)
So has the BIPAP autotitration been ordered

## 2017-09-14 NOTE — Telephone Encounter (Signed)
Spoke with Gae Bon in regards to patient's BIPAP machine.  [facility] contacted Gae Bon 1/21 and the settings will be adjusted for her particular machine.  It was under the impression that the patient had a CPAP machine.  Returned call to patient to clarify this.  Patient verbalized understanding.

## 2017-09-15 NOTE — Telephone Encounter (Signed)
Decrease BiPAP to 12/8cm H2O and get a download in 2 weeks.

## 2017-09-15 NOTE — Telephone Encounter (Signed)
Pt called in today wanting to speak with a supervisor as this issue has been going on since 1/11 and she wants it addressed as she states "this is taking to long".   Pt wants a change in her BiPAP pressures as she feels they are currently to strong. Nurse spoke with Dr. Radford Pax she wants pt to have an "in lab" BiPAP titration because of her current intracranial fluid issues.  Pt made aware of these orders and is very upset, saying she does not want to spend the night anywhere as she is not comfortable with the facility and just wants to have settings adjusted. Told the pt I will speak with Dr. Radford Pax in the morning to see if there are any settings that can be adjusted for her comfort, while we wait on the pre-cert process for her in lab titration, which pt was clear she does not want. Pt was appreciative and looking forward to hearing back on the next steps.

## 2017-09-16 NOTE — Telephone Encounter (Addendum)
Received new orders for patient from Dr Radford Pax and I have faxed the order over to [facility] at [phone]. Patient has been notified on  [phone] that the order has been faxed and patient agrees with treatment.

## 2017-09-16 NOTE — Telephone Encounter (Addendum)
Per Dr Radford Pax Min EPAP 8 cm H20 Max IPAP 12 cm H20 and Pressure Support of 4 cm H20.

## 2017-09-17 NOTE — Telephone Encounter (Addendum)
Pt called in this morning as she was told by [facility] that her orders for BiPAP adjustments were not received correctly. Pt states she is frustrated that she is still having to deal with this.   Orders placed in Epic, sent to MD for signature,MD signed off. Spoke with Barbaraann Rondo at [facility] he can now see the orders, read back to me. Stated he will pass orders through to be completed.   Spoke with pt made her aware that [facility] now has the appropriate orders that they can follow. Pt verbalized understanding and knows to call me back if there are any additional issues.

## 2017-09-17 NOTE — Addendum Note (Signed)
Addended by: Newt Minion on: 09/17/2017 09:09 AM   Modules accepted: Orders

## 2017-09-18 ENCOUNTER — Telehealth: Payer: Self-pay | Admitting: *Deleted

## 2017-09-18 NOTE — Telephone Encounter (Signed)
Informed patient of compliance results and verbalized understanding was indicated. Patient understands her apnea events are in good range at 1.8. Patient understands Dr. Radford Pax will decrease her BiPAP to 12/8 cm H20 with a PS of 4 cm and check a D/L in 2 weeks. She was grateful for the call and thanked me.

## 2017-09-18 NOTE — Telephone Encounter (Signed)
-----   Message from Sueanne Margarita, MD sent at 09/16/2017 11:21 AM EST ----- AHI good and patient feels pressure too high so will decrease biPAP to 12/8cm H2O with PS of 4cm H2O and check D/L in 2 weeks

## 2017-09-20 ENCOUNTER — Encounter: Payer: Self-pay | Admitting: Cardiology

## 2017-09-21 ENCOUNTER — Encounter: Payer: Self-pay | Admitting: Cardiology

## 2017-09-21 NOTE — Progress Notes (Signed)
Cardiology Office Note:    Date:  09/22/2017   ID:  Jenna Mcdonald, DOB 11/20/1945, MRN [id number]  PCP:  Jenna Huddle, MD  Cardiologist:  No primary care provider on file.    Referring MD: Jenna Huddle, MD   Chief Complaint  Patient presents with  . Sleep Apnea  . Hypertension  . Atrial Fibrillation    History of Present Illness:    Jenna Mcdonald is a 72 y.o. female with a hx of OSA on BiPAP, HTN, PAF, mild AS by echo 08/2016 and morbid obesity.  She is here today for followup and is doing well.  She denies any chest pain or pressure, SOB, DOE, PND, orthopnea, LE edema,  palpitations or syncope. She is compliant with her meds and is tolerating meds with no SE.    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  She is followed by Neurosurgery and [facility] and recently was placed on Diamox with complete resolution of her HA.  She also requested that her BiPAP pressure be reduced because she thought it might be attributing to her HAs.  She just started the lower pressure of 12/8cm H2O last night and did well.   She tolerates the bella loop nasal pillow mask but is upset that it is not covered by her insurance and it is the only mask she has been able to tolerate.   She continues to feel rested in the am and has no significant daytime sleepiness.  SHe denies any significant mouth or nasal dryness or nasal congestion.  She does not think that he snores.    Past Medical History:  Diagnosis Date  . Anemia   . Aortic stenosis    mild by echo 08/2016  . Breast CA (Jenna Mcdonald)    left  . Degenerative disc disease, lumbar    of the knees  . Diabetes mellitus   . 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[place])   . Fatigue    Severe secondary to to gemfibrozil  . Gastroesophageal reflux disease    denies  . Hammertoe    left  second toe  . Hypercholesteremia   . Hypertension   . Insomnia   . Irritable bowel   . Low back pain   . Metabolic syndrome   . Obesity   . OSA on CPAP \   bipap  . Osteoarthritis   . Osteopenia   . Persistent atrial fibrillation ([place])   . Personal history of radiation therapy   . Pneumonia   . PONV (postoperative nausea and vomiting)   . Sigmoid diverticulosis   . Thalassemia minor   . Vitamin D deficiency     Past Surgical History:  Procedure Laterality Date  . BREAST LUMPECTOMY Left 05/28/2009  . BREAST SURGERY Left 10   lumpectomy  . CESAREAN SECTION    . CYSTECTOMY  70   pilonidal   . DILATION AND CURETTAGE OF UTERUS    . EYE SURGERY Bilateral 12/14   cataracts  . MAXIMUM ACCESS (MAS)POSTERIOR LUMBAR INTERBODY FUSION (PLIF) 2 LEVEL N/A 09/22/2013   Procedure: FOR MAXIMUM ACCESS  POSTERIOR LUMBAR INTERBODY FUSION LUMBAR THREE-FOUR FOUR-FIVE;  Surgeon: Eustace Moore, MD;  Location: [facility];  Service: Neurosurgery;  Laterality: N/A;  . OOPHORECTOMY     wedge section not  rem  . 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 REPLACEMENT TOTAL KNEE    . TONSILLECTOMY    . TUBAL LIGATION      Current Medications: Current Meds  Medication Sig  . acetaminophen (TYLENOL) 500 MG tablet Take 500 mg by mouth every 6 (six) hours as needed for mild pain. For back pain  . acetaZOLAMIDE (DIAMOX) 250 MG tablet Take 250 mg by mouth 2 (two) times daily.   . Calcium Carbonate-Vitamin D (CALCIUM 600 + D PO) Take 1 tablet by mouth daily.   . cyclobenzaprine (FLEXERIL) 10 MG tablet Take 10 mg by mouth 2 (two) times daily as needed for muscle spasms.   Marland Kitchen diltiazem (CARDIZEM) 90 MG tablet TAKE 1 TABLET (90 MG TOTAL) BY MOUTH 3 (THREE) TIMES DAILY.  Marland Kitchen glimepiride (AMARYL) 4 MG tablet Take 0.5 tablets by mouth daily.  . magnesium oxide (MAG-OX) 400 MG tablet Take 400 mg by mouth 3 (three) times a week.   . metFORMIN (GLUCOPHAGE-XR) 500 MG 24 hr tablet Take 1 tablet by mouth 2 (two) times daily.    . Multiple Vitamin (MULITIVITAMIN WITH MINERALS) TABS Take 1 tablet by mouth daily.  . traMADol (ULTRAM) 50 MG tablet Take 50 mg by mouth every 8 (eight) hours as needed for moderate pain. Reported on 02/04/2016  . warfarin (COUMADIN) 5 MG tablet Take 5 mg by mouth every evening. 7mg  Sunday, Tuesday, Thursday. 5mg  Monday, Wednesday, Friday, Saturday.     Allergies:   Aromasin [exemestane]; Femara [letrozole]; Gemfibrozil; Hydrocodone-acetaminophen; Letrozole; Metoprolol; Other; Tamoxifen; Tetracycline; Tetracyclines & related; Toprol xl [metoprolol tartrate]; Verapamil; and Vicodin [hydrocodone-acetaminophen]   Social History   Socioeconomic History  . Marital status: Single    Spouse name: None  . Number of children: None  . Years of education: None  . Highest education level: None  Social Needs  . Financial resource strain: None  . Food insecurity - worry: None  . Food insecurity - inability: None  . Transportation needs - medical: None  . Transportation needs - non-medical: None  Occupational History  . None  Tobacco Use  . Smoking status: Former Smoker    Packs/day: 0.50    Years: 10.00    Pack years: 5.00    Types: Cigarettes    Last attempt to quit: 11/12/2003    Years since quitting: 13.8  . Smokeless tobacco: Never Used  Substance and Sexual Activity  . Alcohol use: Yes  . Drug use: No  . Sexual activity: No    Birth control/protection: Post-menopausal  Other Topics Concern  . None  Social History Narrative  . None     Family History: The patient's family history includes Anesthesia problems in her mother; Diabetes in her father and mother.  ROS:   Please see the history of present illness.    ROS  All other systems reviewed and negative.   EKGs/Labs/Other Studies Reviewed:    The following studies were reviewed today: BiPAP download  EKG:  EKG is not ordered today.    Recent Labs: No results found for requested labs within last 8760 hours.    Recent Lipid Panel    Component Value Date/Time   CHOL  05/14/2008 0435    146        ATP III CLASSIFICATION:  <200     mg/dL   Desirable  200-239  mg/dL   Borderline High  >=240    mg/dL   High   TRIG 105 05/14/2008 0435   HDL 27 (  L) 05/14/2008 0435   CHOLHDL 5.4 05/14/2008 0435   VLDL 21 05/14/2008 0435   LDLCALC  05/14/2008 0435    98        Total Cholesterol/HDL:CHD Risk Coronary Heart Disease Risk Table                     Men   Women  1/2 Average Risk   3.4   3.3    Physical Exam:    VS:  BP (!) 122/58   Pulse 61   Ht 5' 4.5" (1.638 m)   Wt 239 lb (108.4 kg)   BMI 40.39 kg/m     Wt Readings from Last 3 Encounters:  09/22/17 239 lb (108.4 kg)  09/09/16 222 lb (100.7 kg)  02/04/16 244 lb (110.7 kg)     GEN:  Well nourished, well developed in no acute distress HEENT: Normal NECK: No JVD; No carotid bruits LYMPHATICS: No lymphadenopathy CARDIAC: RRR, no rubs, gallops.  2/6 SM ast RUSB to LLSB RESPIRATORY:  Clear to auscultation without rales, wheezing or rhonchi  ABDOMEN: Soft, non-tender, non-distended MUSCULOSKELETAL:  No edema; No deformity  SKIN: Warm and dry NEUROLOGIC:  Alert and oriented x 3 PSYCHIATRIC:  Normal affect   ASSESSMENT:    1. Persistent atrial fibrillation ([place])   2. Essential hypertension   3. Nonrheumatic aortic valve stenosis   4. OSA (obstructive sleep apnea)   5. Bruit of left carotid artery    PLAN:    In order of problems listed above:  1.  Persistent atrial fibrillation - she is maintaining NSR on exam. She will continue on Cardizem and Propafenone 225mg  PRN for breakgthrough.  She will continue on Warfarin followed in our coumadin clinic.   2.  HTN - BP is well controlled on exam. She will continue on Cardizem 90mg  q6 hour.  3.  Mild Aortic stenosis - mild by AS with mean AVG of 47mmHg.  She is asymptomatic.   4.  OSA - the patient is tolerating PAP therapy well without any problems. The PAP download was reviewed  today and showed an AHI of 2.1/hr on 14/10 cm H2O with 100% compliance in using more than 4 hours nightly.  The patient has been using and benefiting from PAP use and will continue to benefit from therapy. Her BiPAP was just decreased to 12/55mmhg and I will get a download in 2 weeks.  She would like a new BiPAP machine because hers is old.  I will order a new ResMed BiPAP at 12/8cm H2O and she will see me back in 12 weeks.  I will also send a letter to [facility] to forward to Medicare to get her Bella loop nasal pillow mask covered as she has been intolerant to all other masks.  5.  Left carotid artery bruit - I will get a carotid doppler   Medication Adjustments/Labs and Tests Ordered: Current medicines are reviewed at length with the patient today.  Concerns regarding medicines are outlined above.  No orders of the defined types were placed in this encounter.  No orders of the defined types were placed in this encounter.   Signed, Fransico Him, MD  09/22/2017 10:23 AM    [place]

## 2017-09-22 ENCOUNTER — Encounter: Payer: Self-pay | Admitting: Cardiology

## 2017-09-22 ENCOUNTER — Ambulatory Visit (INDEPENDENT_AMBULATORY_CARE_PROVIDER_SITE_OTHER): Payer: Medicare Other | Admitting: Cardiology

## 2017-09-22 VITALS — BP 122/58 | HR 61 | Ht 64.5 in | Wt 239.0 lb

## 2017-09-22 DIAGNOSIS — R0989 Other specified symptoms and signs involving the circulatory and respiratory systems: Secondary | ICD-10-CM | POA: Diagnosis not present

## 2017-09-22 DIAGNOSIS — I481 Persistent atrial fibrillation: Secondary | ICD-10-CM

## 2017-09-22 DIAGNOSIS — I10 Essential (primary) hypertension: Secondary | ICD-10-CM

## 2017-09-22 DIAGNOSIS — I4819 Other persistent atrial fibrillation: Secondary | ICD-10-CM

## 2017-09-22 DIAGNOSIS — I350 Nonrheumatic aortic (valve) stenosis: Secondary | ICD-10-CM

## 2017-09-22 DIAGNOSIS — G4733 Obstructive sleep apnea (adult) (pediatric): Secondary | ICD-10-CM

## 2017-09-22 NOTE — Patient Instructions (Addendum)
Medication Instructions:  Your physician recommends that you continue on your current medications as directed. Please refer to the Current Medication list given to you today.  Labwork: None Ordered   Testing/Procedures: Your physician has requested that you have a carotid duplex. This test is an ultrasound of the carotid arteries in your neck. It looks at blood flow through these arteries that supply the brain with blood. Allow one hour for this exam. There are no restrictions or special instructions.   Follow-Up: Your physician wants you to follow-up in: 12 weeks with Dr. Radford Pax. You will receive a reminder letter in the mail two months in advance. If you don't receive a letter, please call our office to schedule the follow-up appointment.  Any Other Special Instructions Will Be Listed Below (If Applicable).  BIPAP orders have been placed. You will receive a call from the home health agency regarding setting up equipment. If you do not receive a call within the next week give Gae Bon, CPAP assistant a call at [phone].     If you need a refill on your cardiac medications before your next appointment, please call your pharmacy.

## 2017-10-01 ENCOUNTER — Telehealth: Payer: Self-pay | Admitting: *Deleted

## 2017-10-01 NOTE — Telephone Encounter (Signed)
-----   Message from Sueanne Margarita, MD sent at 09/25/2017 12:10 PM EST ----- Good AHI and compliance.  Continue current PAP settings.

## 2017-10-01 NOTE — Telephone Encounter (Signed)
Informed patient of compliance results and verbalized understanding was indicated. Patient understands her compliance is good also. Patient understands her current settings will not change. Patient thanked me for the call.

## 2017-10-05 ENCOUNTER — Ambulatory Visit (HOSPITAL_COMMUNITY)
Admission: RE | Admit: 2017-10-05 | Discharge: 2017-10-05 | Disposition: A | Discharge status: Home / Self Care | Payer: Medicare Other | Source: Ambulatory Visit | Attending: Internal Medicine | Admitting: Internal Medicine

## 2017-10-05 DIAGNOSIS — R0989 Other specified symptoms and signs involving the circulatory and respiratory systems: Secondary | ICD-10-CM | POA: Diagnosis not present

## 2017-10-08 ENCOUNTER — Encounter: Payer: Self-pay | Admitting: Cardiology

## 2017-10-08 DIAGNOSIS — I6529 Occlusion and stenosis of unspecified carotid artery: Secondary | ICD-10-CM | POA: Insufficient documentation

## 2017-10-22 ENCOUNTER — Telehealth: Payer: Self-pay | Admitting: *Deleted

## 2017-10-22 NOTE — Telephone Encounter (Signed)
Patient has a 10 week follow up appointment scheduled for December 21 2017. Patient understands she needs to keep this appointment for insurance compliance. Patient was grateful for the call and thanked me.

## 2017-11-23 ENCOUNTER — Ambulatory Visit
Admission: RE | Admit: 2017-11-23 | Discharge: 2017-11-23 | Disposition: A | Discharge status: Home / Self Care | Payer: Medicare Other | Source: Ambulatory Visit | Attending: Internal Medicine | Admitting: Internal Medicine

## 2017-11-23 ENCOUNTER — Other Ambulatory Visit: Payer: Self-pay | Admitting: Internal Medicine

## 2017-11-23 DIAGNOSIS — N63 Unspecified lump in unspecified breast: Secondary | ICD-10-CM

## 2017-12-09 ENCOUNTER — Encounter: Payer: Self-pay | Admitting: Cardiology

## 2017-12-18 ENCOUNTER — Telehealth: Payer: Self-pay | Admitting: Cardiology

## 2017-12-18 NOTE — Telephone Encounter (Signed)
Returned call to patient left message on home phone not to bring anything because her machine is wireless and I have pulled the download for her visit.

## 2017-12-18 NOTE — Telephone Encounter (Signed)
New Message:      Pt is calling to see if she needs to bring anything from this machine. Pt states she used to bring in a chip on her old machine but this one does not have one. Pt states you can leave a msg on her voicemail if she is not tat home.

## 2017-12-21 ENCOUNTER — Ambulatory Visit (INDEPENDENT_AMBULATORY_CARE_PROVIDER_SITE_OTHER): Payer: Medicare Other | Admitting: Cardiology

## 2017-12-21 VITALS — BP 130/58 | HR 68 | Ht 64.5 in | Wt 243.0 lb

## 2017-12-21 DIAGNOSIS — G4733 Obstructive sleep apnea (adult) (pediatric): Secondary | ICD-10-CM | POA: Diagnosis not present

## 2017-12-21 DIAGNOSIS — I10 Essential (primary) hypertension: Secondary | ICD-10-CM | POA: Diagnosis not present

## 2017-12-21 NOTE — Progress Notes (Addendum)
Cardiology Office Note:    Date:  12/21/2017   ID:  Jenna Mcdonald, DOB 02-22-1946, MRN [id number]  PCP:  Josetta Huddle, MD  Cardiologist:  No primary care provider on file.    Referring MD: Josetta Huddle, MD   Chief Complaint  Patient presents with  . Sleep Apnea  . Hypertension    History of Present Illness:    Jenna Mcdonald is a 72 y.o. female with a hx of OSA on BiPAP, HTN, PAF, mild AS by echo 08/2016 and morbid obesity.  She is here today for followup after getting a new PAP device.  She is doing well with her PAP device and thinks that she has gotten used to it.  She tolerates the mask and feels the pressure is adequate.  Since going on PAP she feels rested in the am and has no significant daytime sleepiness.  She denies any significant mouth or nasal dryness or nasal congestion.  She does not think that he snores.      Past Medical History:  Diagnosis Date  . Anemia   . Aortic stenosis    mild by echo 08/2016  . Breast CA ([place])    left  . Carotid stenosis    1-39% left  . Carotid stenosis   . Degenerative disc disease, lumbar    of the knees  . Diabetes mellitus   . 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[place])   . Fatigue    Severe secondary to to gemfibrozil  . Gastroesophageal reflux disease    denies  . Hammertoe    left second toe  . Hypercholesteremia   . Hypertension   . Insomnia   . Irritable bowel   . Low back pain   . Metabolic syndrome   . Obesity   . OSA on CPAP \   bipap  . Osteoarthritis   . Osteopenia   . Persistent atrial fibrillation ([place])   . Personal history of radiation therapy   . Pneumonia   . PONV (postoperative nausea and vomiting)   . Sigmoid diverticulosis   . Thalassemia minor   . Vitamin D deficiency     Past Surgical History:  Procedure Laterality Date  . BREAST LUMPECTOMY Left 05/28/2009  . BREAST SURGERY Left 10   lumpectomy  . CESAREAN SECTION    . CYSTECTOMY  70   pilonidal   . DILATION AND CURETTAGE OF UTERUS    . EYE  SURGERY Bilateral 12/14   cataracts  . MAXIMUM ACCESS (MAS)POSTERIOR LUMBAR INTERBODY FUSION (PLIF) 2 LEVEL N/A 09/22/2013   Procedure: FOR MAXIMUM ACCESS  POSTERIOR LUMBAR INTERBODY FUSION LUMBAR THREE-FOUR FOUR-FIVE;  Surgeon: Eustace Moore, MD;  Location: [facility];  Service: Neurosurgery;  Laterality: N/A;  . OOPHORECTOMY     wedge section not rem  . 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 REPLACEMENT TOTAL KNEE    . TONSILLECTOMY    . TUBAL LIGATION      Current Medications: Current Meds  Medication Sig  . acetaminophen (TYLENOL) 500 MG tablet Take 500 mg by mouth every 6 (six) hours as needed for mild pain. For back pain  . Calcium Carbonate-Vitamin D (CALCIUM 600 + D PO) Take 1 tablet by mouth daily.   . cyclobenzaprine (FLEXERIL) 10 MG tablet Take 10 mg by mouth 2 (two) times daily as needed for muscle spasms.   Marland Kitchen diltiazem (CARDIZEM) 90 MG tablet TAKE 1 TABLET (90 MG TOTAL) BY MOUTH 3 (THREE) TIMES DAILY.  Marland Kitchen glimepiride (  AMARYL) 4 MG tablet Take 0.5 tablets by mouth daily.  . magnesium oxide (MAG-OX) 400 MG tablet Take 400 mg by mouth 3 (three) times a week.   . metFORMIN (GLUCOPHAGE-XR) 500 MG 24 hr tablet Take 1 tablet by mouth 2 (two) times daily.  . Multiple Vitamin (MULITIVITAMIN WITH MINERALS) TABS Take 1 tablet by mouth daily.  . traMADol (ULTRAM) 50 MG tablet Take 50 mg by mouth every 8 (eight) hours as needed for moderate pain. Reported on 02/04/2016  . warfarin (COUMADIN) 5 MG tablet Take 5 mg by mouth every evening. 7.5 mg Sunday, Tuesday, Thursday. 5mg  Monday, Wednesday, Friday, Saturday.     Allergies:   Aromasin [exemestane]; Femara [letrozole]; Gemfibrozil; Hydrocodone-acetaminophen; Letrozole; Metoprolol; Other; Tamoxifen; Tetracycline; Tetracyclines & related; Toprol xl [metoprolol tartrate]; Verapamil; and Vicodin [hydrocodone-acetaminophen]   Social History   Socioeconomic History  . Marital status: Single    Spouse name: Not on file  . Number of children: Not  on file  . Years of education: Not on file  . Highest education level: Not on file  Occupational History  . Not on file  Social Needs  . Financial resource strain: Not on file  . Food insecurity:    Worry: Not on file    Inability: Not on file  . Transportation needs:    Medical: Not on file    Non-medical: Not on file  Tobacco Use  . Smoking status: Former Smoker    Packs/day: 0.50    Years: 10.00    Pack years: 5.00    Types: Cigarettes    Last attempt to quit: 11/12/2003    Years since quitting: 14.1  . Smokeless tobacco: Never Used  Substance and Sexual Activity  . Alcohol use: Yes  . Drug use: No  . Sexual activity: Never    Birth control/protection: Post-menopausal  Lifestyle  . Physical activity:    Days per week: Not on file    Minutes per session: Not on file  . Stress: Not on file  Relationships  . Social connections:    Talks on phone: Not on file    Gets together: Not on file    Attends religious service: Not on file    Active member of club or organization: Not on file    Attends meetings of clubs or organizations: Not on file    Relationship status: Not on file  Other Topics Concern  . Not on file  Social History Narrative  . Not on file     Family History: The patient's family history includes Anesthesia problems in her mother; Diabetes in her father and mother.  ROS:   Please see the history of present illness.    ROS  All other systems reviewed and negative.   EKGs/Labs/Other Studies Reviewed:    The following studies were reviewed today: PAP download  EKG:  EKG is not ordered today.    Recent Labs: No results found for requested labs within last 8760 hours.   Recent Lipid Panel    Component Value Date/Time   CHOL  05/14/2008 0435    146        ATP III CLASSIFICATION:  <200     mg/dL   Desirable  200-239  mg/dL   Borderline High  >=240    mg/dL   High   TRIG 105 05/14/2008 0435   HDL 27 (L) 05/14/2008 0435   CHOLHDL 5.4  05/14/2008 0435   VLDL 21 05/14/2008 0435   LDLCALC  05/14/2008  0435    98        Total Cholesterol/HDL:CHD Risk Coronary Heart Disease Risk Table                     Men   Women  1/2 Average Risk   3.4   3.3    Physical Exam:    VS:  BP (!) 130/58   Pulse 68   Ht 5' 4.5" (1.638 m)   Wt 243 lb (110.2 kg)   BMI 41.07 kg/m     Wt Readings from Last 3 Encounters:  12/21/17 243 lb (110.2 kg)  09/22/17 239 lb (108.4 kg)  09/09/16 222 lb (100.7 kg)     GEN:  Well nourished, well developed in no acute distress HEENT: Normal NECK: No JVD; No carotid bruits LYMPHATICS: No lymphadenopathy CARDIAC: RRR, no murmurs, rubs, gallops RESPIRATORY:  Clear to auscultation without rales, wheezing or rhonchi  ABDOMEN: Soft, non-tender, non-distended MUSCULOSKELETAL:  No edema; No deformity  SKIN: Warm and dry NEUROLOGIC:  Alert and oriented x 3 PSYCHIATRIC:  Normal affect   ASSESSMENT:    1. OSA (obstructive sleep apnea)   2. Essential hypertension   3. Obesity, Class III, BMI 40-49.9 (morbid obesity) ([place])    PLAN:    In order of problems listed above:  1.  OSA - the patient is tolerating PAP therapy well without any problems. The PAP download was reviewed today and showed an AHI of 1.8/hr on BiPAP at 12/8 cm H2O with 100% compliance in using more than 4 hours nightly.  The patient has been using and benefiting from PAP use and will continue to benefit from therapy.   2.  HTN - BP is well controlled on exam today.    3.  Obesity - I have encouraged her to get into a routine exercise program and cut back on carbs and portions.    Medication Adjustments/Labs and Tests Ordered: Current medicines are reviewed at length with the patient today.  Concerns regarding medicines are outlined above.  No orders of the defined types were placed in this encounter.  No orders of the defined types were placed in this encounter.   Signed, Fransico Him, MD  12/21/2017 10:39 AM    [place]

## 2017-12-21 NOTE — Patient Instructions (Signed)
Medication Instructions:  Your physician recommends that you continue on your current medications as directed. Please refer to the Current Medication list given to you today.  If you need a refill on your cardiac medications, please contact your pharmacy first.  Labwork: None ordered   Testing/Procedures: None ordered   Follow-Up: Your physician wants you to follow-up in: 1 year with Dr. Turner. You will receive a reminder letter in the mail two months in advance. If you don't receive a letter, please call our office to schedule the follow-up appointment.  Any Other Special Instructions Will Be Listed Below (If Applicable).   Thank you for choosing [facility]    Rena Rubylee Zamarripa, RN  [phone]  If you need a refill on your cardiac medications before your next appointment, please call your pharmacy.   

## 2018-04-13 ENCOUNTER — Telehealth: Payer: Self-pay | Admitting: Cardiology

## 2018-04-13 NOTE — Telephone Encounter (Signed)
No Answer, No VM

## 2018-04-13 NOTE — Telephone Encounter (Signed)
New Message    Patient is calling because she is interested in obtaining a monitor for Afib. She wants to know what the best monitor there is to purchase. Please call.

## 2018-04-14 NOTE — Telephone Encounter (Signed)
Spoke with the patient and she was asking about home monitors that she could buy in a store to monitor her heart rhythm and her sons. Her son was also recently diagnosed with atrial fibrillation. I advised her that we do not have any specific recommendations of something for her to purchase but I did tell her that we do have patients that on their own have tried the [employer] monitor and the series 4 Apple Watch. I expressed to her that checking your heart and pulse yourself for a full minute is always helpful and to continue to take her heart meds and blood thinners is always what we recommend living with Afib. She verbalized understanding and will continue to look for something to have at home. I advised her to be sure to checkout reviews prior to purchasing any kind of equipment and to let us know if there is anything that we can do to further assist her. Pt agrees and verbalized her appreciation.

## 2018-04-14 NOTE — Telephone Encounter (Signed)
Follow up    Patient is returning call in reference to the type of afib monitor that she should order. Please call.

## 2018-05-27 ENCOUNTER — Ambulatory Visit
Admission: RE | Admit: 2018-05-27 | Discharge: 2018-05-27 | Disposition: A | Discharge status: Home / Self Care | Payer: Medicare Other | Source: Ambulatory Visit | Attending: Internal Medicine | Admitting: Internal Medicine

## 2018-05-27 ENCOUNTER — Ambulatory Visit: Payer: Medicare Other

## 2018-05-27 DIAGNOSIS — N63 Unspecified lump in unspecified breast: Secondary | ICD-10-CM

## 2018-07-02 ENCOUNTER — Inpatient Hospital Stay (HOSPITAL_COMMUNITY)
Admission: EM | Admit: 2018-07-02 | Discharge: 2018-07-07 | DRG: 309 | Disposition: A | Discharge status: Home / Self Care | Payer: Medicare Other | Attending: Cardiovascular Disease | Admitting: Cardiovascular Disease

## 2018-07-02 ENCOUNTER — Encounter (HOSPITAL_COMMUNITY): Payer: Self-pay | Admitting: Cardiology

## 2018-07-02 ENCOUNTER — Other Ambulatory Visit: Payer: Self-pay

## 2018-07-02 ENCOUNTER — Emergency Department (HOSPITAL_COMMUNITY): Payer: Medicare Other

## 2018-07-02 DIAGNOSIS — Z7984 Long term (current) use of oral hypoglycemic drugs: Secondary | ICD-10-CM

## 2018-07-02 DIAGNOSIS — Z833 Family history of diabetes mellitus: Secondary | ICD-10-CM

## 2018-07-02 DIAGNOSIS — K621 Rectal polyp: Secondary | ICD-10-CM | POA: Diagnosis present

## 2018-07-02 DIAGNOSIS — I10 Essential (primary) hypertension: Secondary | ICD-10-CM | POA: Diagnosis present

## 2018-07-02 DIAGNOSIS — G4733 Obstructive sleep apnea (adult) (pediatric): Secondary | ICD-10-CM | POA: Diagnosis present

## 2018-07-02 DIAGNOSIS — Z7901 Long term (current) use of anticoagulants: Secondary | ICD-10-CM

## 2018-07-02 DIAGNOSIS — I4819 Other persistent atrial fibrillation: Secondary | ICD-10-CM | POA: Diagnosis not present

## 2018-07-02 DIAGNOSIS — E119 Type 2 diabetes mellitus without complications: Secondary | ICD-10-CM

## 2018-07-02 DIAGNOSIS — Z87891 Personal history of nicotine dependence: Secondary | ICD-10-CM

## 2018-07-02 DIAGNOSIS — I480 Paroxysmal atrial fibrillation: Secondary | ICD-10-CM | POA: Diagnosis present

## 2018-07-02 DIAGNOSIS — K589 Irritable bowel syndrome without diarrhea: Secondary | ICD-10-CM | POA: Diagnosis present

## 2018-07-02 DIAGNOSIS — Z96652 Presence of left artificial knee joint: Secondary | ICD-10-CM | POA: Diagnosis present

## 2018-07-02 DIAGNOSIS — I350 Nonrheumatic aortic (valve) stenosis: Secondary | ICD-10-CM

## 2018-07-02 DIAGNOSIS — I4891 Unspecified atrial fibrillation: Secondary | ICD-10-CM | POA: Diagnosis not present

## 2018-07-02 DIAGNOSIS — R0989 Other specified symptoms and signs involving the circulatory and respiratory systems: Secondary | ICD-10-CM | POA: Diagnosis present

## 2018-07-02 DIAGNOSIS — Z881 Allergy status to other antibiotic agents status: Secondary | ICD-10-CM

## 2018-07-02 DIAGNOSIS — D563 Thalassemia minor: Secondary | ICD-10-CM | POA: Diagnosis present

## 2018-07-02 DIAGNOSIS — Z6841 Body Mass Index (BMI) 40.0 and over, adult: Secondary | ICD-10-CM

## 2018-07-02 DIAGNOSIS — E8881 Metabolic syndrome: Secondary | ICD-10-CM | POA: Diagnosis present

## 2018-07-02 DIAGNOSIS — E66813 Obesity, class 3: Secondary | ICD-10-CM | POA: Diagnosis present

## 2018-07-02 DIAGNOSIS — Z888 Allergy status to other drugs, medicaments and biological substances status: Secondary | ICD-10-CM

## 2018-07-02 DIAGNOSIS — G4700 Insomnia, unspecified: Secondary | ICD-10-CM | POA: Diagnosis present

## 2018-07-02 DIAGNOSIS — K219 Gastro-esophageal reflux disease without esophagitis: Secondary | ICD-10-CM | POA: Diagnosis present

## 2018-07-02 DIAGNOSIS — Z853 Personal history of malignant neoplasm of breast: Secondary | ICD-10-CM

## 2018-07-02 DIAGNOSIS — K449 Diaphragmatic hernia without obstruction or gangrene: Secondary | ICD-10-CM | POA: Diagnosis present

## 2018-07-02 DIAGNOSIS — Z79891 Long term (current) use of opiate analgesic: Secondary | ICD-10-CM

## 2018-07-02 DIAGNOSIS — R195 Other fecal abnormalities: Secondary | ICD-10-CM

## 2018-07-02 DIAGNOSIS — K648 Other hemorrhoids: Secondary | ICD-10-CM | POA: Diagnosis present

## 2018-07-02 DIAGNOSIS — E559 Vitamin D deficiency, unspecified: Secondary | ICD-10-CM | POA: Diagnosis present

## 2018-07-02 DIAGNOSIS — Z79899 Other long term (current) drug therapy: Secondary | ICD-10-CM

## 2018-07-02 DIAGNOSIS — K5520 Angiodysplasia of colon without hemorrhage: Secondary | ICD-10-CM | POA: Diagnosis present

## 2018-07-02 DIAGNOSIS — D500 Iron deficiency anemia secondary to blood loss (chronic): Secondary | ICD-10-CM | POA: Diagnosis present

## 2018-07-02 DIAGNOSIS — K644 Residual hemorrhoidal skin tags: Secondary | ICD-10-CM | POA: Diagnosis present

## 2018-07-02 DIAGNOSIS — Q211 Atrial septal defect: Secondary | ICD-10-CM

## 2018-07-02 DIAGNOSIS — Z923 Personal history of irradiation: Secondary | ICD-10-CM

## 2018-07-02 DIAGNOSIS — Z885 Allergy status to narcotic agent status: Secondary | ICD-10-CM

## 2018-07-02 DIAGNOSIS — K5730 Diverticulosis of large intestine without perforation or abscess without bleeding: Secondary | ICD-10-CM | POA: Diagnosis present

## 2018-07-02 DIAGNOSIS — K269 Duodenal ulcer, unspecified as acute or chronic, without hemorrhage or perforation: Secondary | ICD-10-CM | POA: Diagnosis present

## 2018-07-02 DIAGNOSIS — D649 Anemia, unspecified: Secondary | ICD-10-CM

## 2018-07-02 DIAGNOSIS — E8770 Fluid overload, unspecified: Secondary | ICD-10-CM | POA: Diagnosis not present

## 2018-07-02 DIAGNOSIS — Z90722 Acquired absence of ovaries, bilateral: Secondary | ICD-10-CM

## 2018-07-02 DIAGNOSIS — E785 Hyperlipidemia, unspecified: Secondary | ICD-10-CM | POA: Diagnosis present

## 2018-07-02 DIAGNOSIS — Z981 Arthrodesis status: Secondary | ICD-10-CM

## 2018-07-02 LAB — CBC WITH DIFFERENTIAL/PLATELET
Basophils Absolute: 0.1 10*3/uL (ref 0.0–0.1)
Basophils Relative: 2 %
Eosinophils Absolute: 0 10*3/uL (ref 0.0–0.5)
Eosinophils Relative: 0 %
HCT: 36.4 % (ref 36.0–46.0)
Hemoglobin: 10.2 g/dL — ABNORMAL LOW (ref 12.0–15.0)
Lymphocytes Relative: 25 %
Lymphs Abs: 1.8 10*3/uL (ref 0.7–4.0)
MCH: 17.6 pg — ABNORMAL LOW (ref 26.0–34.0)
MCHC: 28 g/dL — ABNORMAL LOW (ref 30.0–36.0)
MCV: 62.7 fL — ABNORMAL LOW (ref 80.0–100.0)
Monocytes Absolute: 0.2 10*3/uL (ref 0.1–1.0)
Monocytes Relative: 3 %
Neutro Abs: 4.9 10*3/uL (ref 1.7–7.7)
Neutrophils Relative %: 70 %
Platelets: 174 10*3/uL (ref 150–400)
RBC: 5.81 MIL/uL — ABNORMAL HIGH (ref 3.87–5.11)
RDW: 20.3 % — ABNORMAL HIGH (ref 11.5–15.5)
WBC: 7 10*3/uL (ref 4.0–10.5)
nRBC: 0 % (ref 0.0–0.2)
nRBC: 0 /100 WBC

## 2018-07-02 LAB — COMPREHENSIVE METABOLIC PANEL
ALT: 45 U/L — ABNORMAL HIGH (ref 0–44)
AST: 45 U/L — ABNORMAL HIGH (ref 15–41)
Albumin: 3.9 g/dL (ref 3.5–5.0)
Alkaline Phosphatase: 80 U/L (ref 38–126)
Anion gap: 9 (ref 5–15)
BUN: 15 mg/dL (ref 8–23)
CO2: 22 mmol/L (ref 22–32)
Calcium: 9.4 mg/dL (ref 8.9–10.3)
Chloride: 110 mmol/L (ref 98–111)
Creatinine, Ser: 1.15 mg/dL — ABNORMAL HIGH (ref 0.44–1.00)
GFR calc Af Amer: 54 mL/min — ABNORMAL LOW (ref >60)
GFR calc non Af Amer: 46 mL/min — ABNORMAL LOW (ref >60)
Glucose, Bld: 133 mg/dL — ABNORMAL HIGH (ref 70–99)
Potassium: 4.6 mmol/L (ref 3.5–5.1)
Sodium: 141 mmol/L (ref 135–145)
Total Bilirubin: 0.5 mg/dL (ref 0.3–1.2)
Total Protein: 7 g/dL (ref 6.5–8.1)

## 2018-07-02 LAB — PROTIME-INR
INR: 3.19
Prothrombin Time: 32.2 seconds — ABNORMAL HIGH (ref 11.4–15.2)

## 2018-07-02 LAB — I-STAT TROPONIN, ED: Troponin i, poc: 0.01 ng/mL (ref 0.00–0.08)

## 2018-07-02 LAB — GLUCOSE, CAPILLARY
Glucose-Capillary: 129 mg/dL — ABNORMAL HIGH (ref 70–99)
Glucose-Capillary: 105 mg/dL — ABNORMAL HIGH (ref 70–99)

## 2018-07-02 LAB — TROPONIN I: Troponin I: <0.03 ng/mL (ref <0.03)

## 2018-07-02 LAB — TSH: TSH: 1.744 u[IU]/mL (ref 0.350–4.500)

## 2018-07-02 LAB — HEMOGLOBIN A1C
Hgb A1c MFr Bld: 7.1 % — ABNORMAL HIGH (ref 4.8–5.6)
Mean Plasma Glucose: 157.07 mg/dL

## 2018-07-02 MED ORDER — WARFARIN SODIUM 1 MG PO TABS
1.0000 mg | ORAL_TABLET | Freq: Once | ORAL | Status: AC
  Administered 2018-07-02: 1 mg via ORAL
  Filled 2018-07-02: qty 1

## 2018-07-02 MED ORDER — METOPROLOL TARTRATE 25 MG PO TABS
25.0000 mg | ORAL_TABLET | Freq: Two times a day (BID) | ORAL | Status: DC
  Administered 2018-07-02 – 2018-07-07 (×10): 25 mg via ORAL
  Filled 2018-07-02 (×10): qty 1

## 2018-07-02 MED ORDER — DILTIAZEM HCL-DEXTROSE 100-5 MG/100ML-% IV SOLN (PREMIX)
10.0000 mg/h | INTRAVENOUS | Status: DC
  Administered 2018-07-02: 10 mg/h via INTRAVENOUS
  Administered 2018-07-02: 5 mg/h via INTRAVENOUS
  Administered 2018-07-03: 10 mg/h via INTRAVENOUS
  Filled 2018-07-02 (×5): qty 100

## 2018-07-02 MED ORDER — ACETAMINOPHEN 500 MG PO TABS
500.0000 mg | ORAL_TABLET | Freq: Four times a day (QID) | ORAL | Status: DC | PRN
  Administered 2018-07-02 – 2018-07-06 (×6): 500 mg via ORAL
  Filled 2018-07-02 (×6): qty 1

## 2018-07-02 MED ORDER — WARFARIN - PHARMACIST DOSING INPATIENT
Freq: Every day | Status: DC
  Administered 2018-07-05: 23:00:00

## 2018-07-02 MED ORDER — SODIUM CHLORIDE 0.9 % IV SOLN
INTRAVENOUS | Status: DC
  Administered 2018-07-02: 11:00:00 via INTRAVENOUS
  Administered 2018-07-02: 990 mL via INTRAVENOUS
  Administered 2018-07-03 (×2): via INTRAVENOUS

## 2018-07-02 MED ORDER — WARFARIN SODIUM 5 MG PO TABS: 5.0000 mg | ORAL_TABLET | ORAL | Status: DC

## 2018-07-02 MED ORDER — INSULIN ASPART 100 UNIT/ML ~~LOC~~ SOLN
0.0000 [IU] | Freq: Three times a day (TID) | SUBCUTANEOUS | Status: DC
  Administered 2018-07-02 – 2018-07-03 (×2): 2 [IU] via SUBCUTANEOUS
  Administered 2018-07-03: 3 [IU] via SUBCUTANEOUS
  Administered 2018-07-03: 2 [IU] via SUBCUTANEOUS
  Administered 2018-07-04: 3 [IU] via SUBCUTANEOUS
  Administered 2018-07-04 – 2018-07-05 (×3): 2 [IU] via SUBCUTANEOUS
  Administered 2018-07-06: 3 [IU] via SUBCUTANEOUS
  Administered 2018-07-06 – 2018-07-07 (×2): 2 [IU] via SUBCUTANEOUS
  Administered 2018-07-07: 3 [IU] via SUBCUTANEOUS
  Filled 2018-07-02 (×5): qty 1

## 2018-07-02 MED ORDER — ONDANSETRON HCL 4 MG/2ML IJ SOLN
4.0000 mg | Freq: Four times a day (QID) | INTRAMUSCULAR | Status: DC | PRN
  Administered 2018-07-04: 4 mg via INTRAVENOUS
  Filled 2018-07-02: qty 2

## 2018-07-02 MED ORDER — GUAIFENESIN-DM 100-10 MG/5ML PO SYRP
5.0000 mL | ORAL_SOLUTION | ORAL | Status: DC | PRN
  Administered 2018-07-02 – 2018-07-07 (×2): 5 mL via ORAL
  Filled 2018-07-02 (×4): qty 5

## 2018-07-02 MED ORDER — DILTIAZEM LOAD VIA INFUSION
20.0000 mg | Freq: Once | INTRAVENOUS | Status: AC
  Administered 2018-07-02: 20 mg via INTRAVENOUS
  Filled 2018-07-02: qty 20

## 2018-07-02 MED ORDER — METOPROLOL TARTRATE 5 MG/5ML IV SOLN
2.5000 mg | INTRAVENOUS | Status: AC
  Administered 2018-07-02: 2.5 mg via INTRAVENOUS
  Filled 2018-07-02: qty 5

## 2018-07-02 NOTE — ED Provider Notes (Signed)
[facility] EMERGENCY DEPARTMENT Provider Note   CSN: [id number] Arrival date & time: 07/02/18  1018     History   Chief Complaint No chief complaint on file.   HPI Jenna Mcdonald is a 72 y.o. female.  Patient is a 72 year old female with a history of diabetes, hypertension, aortic stenosis, atrial fibrillation on Coumadin who presents today with multiple symptoms.  Over the last 2 weeks patient has had a dry cough, nasal congestion and mild shortness of breath.  She denies any fevers, abdominal pain, vomiting or diarrhea.  She states she has been eating and drinking normally.  However she said yesterday she started feeling much worse.  She describes symptoms yesterday as lightheadedness when she tries to get up and walk, exertional dyspnea, intermittent chest pressure states she just did not feel well.  She placed a monitor on herself today that said she was in a tachycardic rhythm and she decided to come to the hospital.  She has had no significant changes in her medications and has been taking her medications as prescribed.  Patient did recently take a trip to [place] however she flew so it was a short trip and she denies any unilateral leg pain or swelling   The history is provided by the patient.    Past Medical History:  Diagnosis Date  . Anemia   . Aortic stenosis    mild by echo 08/2016  . Breast CA ([place])    left  . Carotid stenosis    1-39% left  . Carotid stenosis   . Degenerative disc disease, lumbar    of the knees  . Diabetes mellitus   . 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[place])   . Fatigue    Severe secondary to to gemfibrozil  . Gastroesophageal reflux disease    denies  . Hammertoe    left second toe  . Hypercholesteremia   . Hypertension   . Insomnia   . Irritable bowel   . Low back pain   . Metabolic syndrome   . Obesity   . OSA on CPAP \   bipap  . Osteoarthritis   . Osteopenia   . Persistent atrial fibrillation   . Personal history of  radiation therapy   . Pneumonia   . PONV (postoperative nausea and vomiting)   . Sigmoid diverticulosis   . Thalassemia minor   . Vitamin D deficiency     Patient Active Problem List   Diagnosis Date Noted  . Carotid stenosis   . Bruit of left carotid artery 09/22/2017  . Aortic stenosis   . Heart murmur 09/09/2016  . Morbid obesity ([place]) 02/05/2016  . Dry eye 05/17/2015  . Type 2 diabetes mellitus without complication ([place]) 93/71/6967  . Breast cancer, left breast ([place]) 07/22/2014  . Obesity, Class III, BMI 40-49.9 (morbid obesity) ([place]) 07/06/2014  . 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 History of surgical procedure 05/17/2014  . OSA (obstructive sleep apnea) 11/17/2013  . S/P lumbar spinal fusion 09/22/2013  . Persistent atrial fibrillation ([place]) 09/11/2011    Class: Acute  . 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[place]) 09/11/2011    Class: Chronic  . Essential hypertension 09/11/2011  . Exogenous obesity 09/11/2011  . Gastroesophageal reflux disease 09/11/2011  . Thalassemia minor 09/11/2011    Class: Chronic  . Breast cancer ([place]) 09/11/2011    Class: Chronic  . Degenerative joint disease 09/11/2011    Class: Chronic  . Irritable bowel disease 09/11/2011    Class: Chronic  .  Lumbar disc disease 09/11/2011    Past Surgical History:  Procedure Laterality Date  . BREAST LUMPECTOMY Left 05/28/2009  . BREAST SURGERY Left 10   lumpectomy  . CESAREAN SECTION    . CYSTECTOMY  70   pilonidal   . DILATION AND CURETTAGE OF UTERUS    . EYE SURGERY Bilateral 12/14   cataracts  . MAXIMUM ACCESS (MAS)POSTERIOR LUMBAR INTERBODY FUSION (PLIF) 2 LEVEL N/A 09/22/2013   Procedure: FOR MAXIMUM ACCESS  POSTERIOR LUMBAR INTERBODY FUSION LUMBAR THREE-FOUR FOUR-FIVE;  Surgeon: Eustace Moore, MD;  Location: [facility];  Service: Neurosurgery;  Laterality: N/A;  . OOPHORECTOMY     wedge section not rem  . 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 REPLACEMENT TOTAL KNEE    . TONSILLECTOMY    . TUBAL LIGATION        OB History   None      Home Medications    Prior to Admission medications   Medication Sig Start Date End Date Taking? Authorizing Provider  acetaminophen (TYLENOL) 500 MG tablet Take 500 mg by mouth every 6 (six) hours as needed for mild pain. For back pain    [provider]  acetaZOLAMIDE (DIAMOX) 250 MG tablet Take 250 mg by mouth 2 (two) times daily.  09/04/17 10/04/17  [provider]  Calcium Carbonate-Vitamin D (CALCIUM 600 + D PO) Take 1 tablet by mouth daily.     [provider]  cyclobenzaprine (FLEXERIL) 10 MG tablet Take 10 mg by mouth 2 (two) times daily as needed for muscle spasms.     [provider]  diltiazem (CARDIZEM) 90 MG tablet TAKE 1 TABLET (90 MG TOTAL) BY MOUTH 3 (THREE) TIMES DAILY. 10/02/15   Sueanne Margarita, MD  glimepiride (AMARYL) 4 MG tablet Take 0.5 tablets by mouth daily. 10/29/15   [provider]  magnesium oxide (MAG-OX) 400 MG tablet Take 400 mg by mouth 3 (three) times a week.     [provider]  metFORMIN (GLUCOPHAGE-XR) 500 MG 24 hr tablet Take 1 tablet by mouth 2 (two) times daily. 10/31/14   [provider]  Multiple Vitamin (MULITIVITAMIN WITH MINERALS) TABS Take 1 tablet by mouth daily.    [provider]  traMADol (ULTRAM) 50 MG tablet Take 50 mg by mouth every 8 (eight) hours as needed for moderate pain. Reported on 02/04/2016    [provider]  warfarin (COUMADIN) 5 MG tablet Take 5 mg by mouth every evening. 7.5 mg Sunday, Tuesday, Thursday. 5mg  Monday, Wednesday, Friday, Saturday.    [provider]  flecainide (TAMBOCOR) 50 MG tablet Take 1 tablet (50 mg total) by mouth every 12 (twelve) hours. 09/14/11 11/11/11  Jacolyn Reedy, MD    Family History Family History  Problem Relation Age of Onset  . Anesthesia problems Mother   . Diabetes Mother   . Diabetes Father     Social History Social History   Tobacco Use  . Smoking status: Former  Smoker    Packs/day: 0.50    Years: 10.00    Pack years: 5.00    Types: Cigarettes    Last attempt to quit: 11/12/2003    Years since quitting: 14.6  . Smokeless tobacco: Never Used  Substance Use Topics  . Alcohol use: Yes  . Drug use: No     Allergies   Aromasin [exemestane]; Femara [letrozole]; Gemfibrozil; Hydrocodone-acetaminophen; Letrozole; Metoprolol; Other; Tamoxifen; Tetracycline; Tetracyclines & related; Toprol xl [metoprolol tartrate]; Verapamil; and Vicodin [  hydrocodone-acetaminophen]   Review of Systems Review of Systems  All other systems reviewed and are negative.    Physical Exam Updated Vital Signs BP (!) 151/109 (BP Location: Right Arm)   Pulse (!) 145   Temp 98.2 F (36.8 C) (Oral)   Resp 20   SpO2 98%   Physical Exam  Constitutional: She is oriented to person, place, and time. She appears well-developed and well-nourished. No distress.  HENT:  Head: Normocephalic and atraumatic.  Mouth/Throat: Oropharynx is clear and moist.  Eyes: Pupils are equal, round, and reactive to light. Conjunctivae and EOM are normal.  Neck: Normal range of motion. Neck supple.  Cardiovascular: Intact distal pulses. An irregularly irregular rhythm present. Tachycardia present.  No murmur heard. Pulmonary/Chest: Effort normal and breath sounds normal. No respiratory distress. She has no wheezes. She has no rales.  Abdominal: Soft. She exhibits no distension. There is no tenderness. There is no rebound and no guarding.  Musculoskeletal: Normal range of motion. She exhibits no edema or tenderness.  Neurological: She is alert and oriented to person, place, and time.  Skin: Skin is warm and dry. No rash noted. No erythema.  Psychiatric: She has a normal mood and affect. Her behavior is normal.  Nursing note and vitals reviewed.    ED Treatments / Results  Labs (all labs ordered are listed, but only abnormal results are displayed) Labs Reviewed  CBC WITH  DIFFERENTIAL/PLATELET - Abnormal; Notable for the following components:      Result Value   RBC 5.81 (*)    Hemoglobin 10.2 (*)    MCV 62.7 (*)    MCH 17.6 (*)    MCHC 28.0 (*)    RDW 20.3 (*)    All other components within normal limits  COMPREHENSIVE METABOLIC PANEL - Abnormal; Notable for the following components:   Glucose, Bld 133 (*)    Creatinine, Ser 1.15 (*)    AST 45 (*)    ALT 45 (*)    GFR calc non Af Amer 46 (*)    GFR calc Af Amer 54 (*)    All other components within normal limits  PROTIME-INR - Abnormal; Notable for the following components:   Prothrombin Time 32.2 (*)    All other components within normal limits  I-STAT TROPONIN, ED    EKG EKG Interpretation  Date/Time:  Friday July 02 2018 10:24:58 EST Ventricular Rate:  151 PR Interval:    QRS Duration: 98 QT Interval:  275 QTC Calculation: 436 R Axis:   86 Text Interpretation:  Atrial fibrillation with rapid V-rate Borderline right axis deviation Low voltage, precordial leads Repolarization abnormality, prob rate related No significant change since last tracing Confirmed by Blanchie Dessert ([id number]) on 07/02/2018 12:24:27 PM   Radiology Dg Chest Port 1 View  Result Date: 07/02/2018 CLINICAL DATA:  Cough. EXAM: PORTABLE CHEST 1 VIEW COMPARISON:  12/04/2015 FINDINGS: There is peribronchial thickening with slight interstitial accentuation at the lung bases without acute infiltrates or effusions. Heart size and pulmonary vascularity are normal. No significant bone abnormality. IMPRESSION: Bronchitic changes. Electronically Signed   By: Lorriane Shire M.D.   On: 07/02/2018 11:08    Procedures Procedures (including critical care time)  Medications Ordered in ED Medications  0.9 %  sodium chloride infusion ( Intravenous New Bag/Given 07/02/18 1055)  diltiazem (CARDIZEM) 1 mg/mL load via infusion 20 mg (20 mg Intravenous Bolus from Bag 07/02/18 1052)    And  diltiazem (CARDIZEM) 100 mg in dextrose 5%  156mL (  1 mg/mL) infusion (7.5 mg/hr Intravenous Rate/Dose Change 07/02/18 1332)  insulin aspart (novoLOG) injection 0-15 Units (has no administration in time range)  warfarin (COUMADIN) tablet 1 mg (has no administration in time range)  Warfarin - Pharmacist Dosing Inpatient (has no administration in time range)     Initial Impression / Assessment and Plan / ED Course  I have reviewed the triage vital signs and the nursing notes.  Pertinent labs & imaging results that were available during my care of the patient were reviewed by me and considered in my medical decision making (see chart for details).     Patient presenting today with URI symptoms ongoing for the last 2 weeks but then 2 to 3 days of feeling worse with dizziness, lightheadedness and exertional dyspnea.  Patient found to be in A. fib RVR today with heart rates in the 140s.  She is complaining of intermittent chest discomfort but denies shortness of breath at rest.  She has taken her Cardizem this morning and has been taking Coumadin regularly.  Lung sounds are clear and patient may be having viral illness for the last few weeks however we will do an x-ray to rule out pneumonia.  Also labs to ensure normal electrolytes and renal function.  Patient's blood pressure is stable.  Will start with Cardizem bolus and drip for rate control.  INR also pending  12:24 PM Chest x-ray consistent with bronchitic changes but no other acute findings.  Labs showing no acute finding except mild elevation of creatinine to 1.15 otherwise normal.  After Cardizem bolus and drip heart rate is controlled in the 80s.  INR is therapeutic.  Will discuss with cardiology.  CRITICAL CARE Performed by: Caulder Wehner Total critical care time: 30 minutes Critical care time was exclusive of separately billable procedures and treating other patients. Critical care was necessary to treat or prevent imminent or life-threatening deterioration. Critical care was  time spent personally by me on the following activities: development of treatment plan with patient and/or surrogate as well as nursing, discussions with consultants, evaluation of patient's response to treatment, examination of patient, obtaining history from patient or surrogate, ordering and performing treatments and interventions, ordering and review of laboratory studies, ordering and review of radiographic studies, pulse oximetry and re-evaluation of patient's condition.   Final Clinical Impressions(s) / ED Diagnoses   Final diagnoses:  Atrial fibrillation with RVR [facility])    ED Discharge Orders    None       Blanchie Dessert, MD 07/02/18 1616

## 2018-07-02 NOTE — Progress Notes (Signed)
RT applied BiPAP with the patient home settings of max IPAP 15 EPAP 7 ps 5 via nasal mask.  Patient stated she was unable to tolerate the mask due to she is very claustrophobic and wears nasal pillows at home. RT advised patient that RN will be advised that she may need O2 sometime throughout the night.

## 2018-07-02 NOTE — H&P (Addendum)
History & Physical    Patient ID: Jenna Mcdonald MRN: [id number], DOB/AGE: 28-Jul-1946   Admit date: 07/02/2018  Primary Physician: Josetta Huddle, MD Primary Cardiologist: Dr. Fransico Him, MD   Patient Profile    Jenna Mcdonald is a 72yo F with a hx of persistent atrial fibrillation on chronic Coumadin followed with PCP for INR checks, OSA on BiPAP, HTN, mild AS by echo 08/2016 and morbid obesity who presented to [facility] on 07/02/18 with c/o SOB, dry cough and nasal congestion for the last two weeks.   Past Medical History   Past Medical History:  Diagnosis Date  . Anemia   . Aortic stenosis    mild by echo 08/2016  . Breast CA ([place])    left  . Carotid stenosis    1-39% left  . Carotid stenosis   . Degenerative disc disease, lumbar    of the knees  . Diabetes mellitus   . 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[place])   . Fatigue    Severe secondary to to gemfibrozil  . Gastroesophageal reflux disease    denies  . Hammertoe    left second toe  . Hypercholesteremia   . Hypertension   . Insomnia   . Irritable bowel   . Low back pain   . Metabolic syndrome   . Obesity   . OSA on CPAP \   bipap  . Osteoarthritis   . Osteopenia   . Persistent atrial fibrillation   . Personal history of radiation therapy   . Pneumonia   . PONV (postoperative nausea and vomiting)   . Sigmoid diverticulosis   . Thalassemia minor   . Vitamin D deficiency     Past Surgical History:  Procedure Laterality Date  . BREAST LUMPECTOMY Left 05/28/2009  . BREAST SURGERY Left 10   lumpectomy  . CESAREAN SECTION    . CYSTECTOMY  70   pilonidal   . DILATION AND CURETTAGE OF UTERUS    . EYE SURGERY Bilateral 12/14   cataracts  . MAXIMUM ACCESS (MAS)POSTERIOR LUMBAR INTERBODY FUSION (PLIF) 2 LEVEL N/A 09/22/2013   Procedure: FOR MAXIMUM ACCESS  POSTERIOR LUMBAR INTERBODY FUSION LUMBAR THREE-FOUR FOUR-FIVE;  Surgeon: Eustace Moore, MD;  Location: [place] [facility];  Service: Neurosurgery;  Laterality: N/A;  .  OOPHORECTOMY     wedge section not rem  . 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 REPLACEMENT TOTAL KNEE    . TONSILLECTOMY    . TUBAL LIGATION       Allergies  Allergies  Allergen Reactions  . Aromasin [Exemestane]     Hair loss   . Femara [Letrozole]     unknown  . Gemfibrozil Other (See Comments)    Other reaction(s): Other (See Comments) Unknown Severe fatigue  Other reaction(s): Other (See Comments) Unknown Severe fatigue Unknown  . Hydrocodone-Acetaminophen Nausea And Vomiting    Patient reports terrible nausea; plain tylenol is ok with patient  . Letrozole Other (See Comments)    Unknown reactions   . Metoprolol Diarrhea    Fatigue   . Other Other (See Comments)    Other reaction(s): Other (See Comments) Numerous cancer drugs - unknown reaction Other reaction(s): Other (See Comments) Numerous cancer drugs - unknown reaction Numerous cancer drugs - unknown reaction  . Tamoxifen     Bone and body aches   . Tetracycline Other (See Comments)    Other reaction(s): Other (See Comments) Unknown reaction Other reaction(s): Other (See Comments) Unknown reaction Unknown reaction  .  Tetracyclines & Related Other (See Comments)    Vaginal infections    . Toprol Xl [Metoprolol Tartrate]     Hair loss  . Verapamil Other (See Comments)    Other reaction(s): Other (See Comments) Unknown reaction Other reaction(s): Other (See Comments) Unknown reaction Unknown reaction  . Vicodin [Hydrocodone-Acetaminophen] Nausea And Vomiting    Patient reports terrible nausea; plain tylenol is ok with patient    History of Present Illness    Jenna Mcdonald is a 72yo F with a hx of persistent atrial fibrillation on chronic Coumadin followed with PCP for INR checks, OSA on BiPAP, HTN, mild AS by echo 08/2016 and morbid obesity who presented to [facility] on 07/02/18 with c/o SOB, dry cough and nasal congestion for the last two weeks. She states that two days ago she began feeling overwhelmingly  weak and fatigued. Yesterday she notes that she began feeling dizzy and began experiencing lightnheadedness with change in position as well as exertional dyspnea and intermittent chest pressure without radiation. She states that she has never experienced chest pain with her AF in the past. She reports that her chest pressure is improved from presentation however is still there. It is reproducible on exam. She reports that she has a home monitor and checked her rate at home which was reported as "possible tachycardia" with a rate at 160bpm. She reports full medication compliance with Coumadin and diltiazem. She denies palpitations, LE swelling, orthopnea symptoms, fever, chills, N/V or syncope. Given her symptoms she decided to come to the ED for further evaluation   In the ED, she was found to be in atrial fibrillation with RVR with a HR in the 140's sustained. CXR performed secondary to recent c/o of URI symptoms which showed bronchitic changes but no acute cardiopulmonary findings. Creatinine was mildly elevated at 1.15 BP remained stable. She was placed on a diltiazem gtt with bolus for rate control with HR response to the 90-100's. INR found to be therapeutic at 3.19.  Of note, she was last seen by her primary cardiologist on 12/21/17 for CPAP, HTN and AF follow up. At that time, she was doing well with no complaints and no changes made to medical plan.   Home Medications    Prior to Admission medications   Medication Sig Start Date End Date Taking? Authorizing Provider  acetaminophen (TYLENOL) 500 MG tablet Take 500 mg by mouth every 6 (six) hours as needed for mild pain. For back pain   Yes [provider]  Biotin 10 MG TABS Take 10 mg by mouth 2 (two) times daily.   Yes [provider]  Calcium Carbonate-Vitamin D (CALCIUM 600 + D PO) Take 1 tablet by mouth daily.    Yes [provider]  clobetasol (TEMOVATE) 0.05 % external solution Apply 1 application topically 2  (two) times daily. 06/04/18  Yes [provider]  diltiazem (CARDIZEM) 90 MG tablet TAKE 1 TABLET (90 MG TOTAL) BY MOUTH 3 (THREE) TIMES DAILY. Patient taking differently: Take 90 mg by mouth 2 (two) times daily.  10/02/15  Yes Turner, Eber Hong, MD  diphenhydramine-acetaminophen (TYLENOL PM) 25-500 MG TABS tablet Take 1 tablet by mouth at bedtime.   Yes [provider]  glimepiride (AMARYL) 4 MG tablet Take 2 mg by mouth daily.  10/29/15  Yes [provider]  metFORMIN (GLUCOPHAGE-XR) 500 MG 24 hr tablet Take 1 tablet by mouth 2 (two) times daily. 10/31/14  Yes [provider]  Multiple Vitamin ([place])  TABS Take 1 tablet by mouth daily.   Yes [provider]  triamcinolone cream (KENALOG) 0.1 % Apply 1 application topically 2 (two) times daily. 06/26/18  Yes [provider]  warfarin (COUMADIN) 5 MG tablet Take 5 mg by mouth See admin instructions. 7.5 mg  Thursday. 5mg  all the other days   Yes [provider]  flecainide (TAMBOCOR) 50 MG tablet Take 1 tablet (50 mg total) by mouth every 12 (twelve) hours. 09/14/11 11/11/11  Jacolyn Reedy, MD   Family History    Family History  Problem Relation Age of Onset  . Anesthesia problems Mother   . Diabetes Mother   . Diabetes Father     Social History    Social History   Socioeconomic History  . Marital status: Single    Spouse name: Not on file  . Number of children: Not on file  . Years of education: Not on file  . Highest education level: Not on file  Occupational History  . Not on file  Social Needs  . Financial resource strain: Not on file  . Food insecurity:    Worry: Not on file    Inability: Not on file  . Transportation needs:    Medical: Not on file    Non-medical: Not on file  Tobacco Use  . Smoking status: Former Smoker    Packs/day: 0.50    Years: 10.00    Pack years: 5.00    Types: Cigarettes    Last attempt to quit: 11/12/2003    Years  since quitting: 14.6  . Smokeless tobacco: Never Used  Substance and Sexual Activity  . Alcohol use: Yes  . Drug use: No  . Sexual activity: Never    Birth control/protection: Post-menopausal  Lifestyle  . Physical activity:    Days per week: Not on file    Minutes per session: Not on file  . Stress: Not on file  Relationships  . Social connections:    Talks on phone: Not on file    Gets together: Not on file    Attends religious service: Not on file    Active member of club or organization: Not on file    Attends meetings of clubs or organizations: Not on file    Relationship status: Not on file  . Intimate partner violence:    Fear of current or ex partner: Not on file    Emotionally abused: Not on file    Physically abused: Not on file    Forced sexual activity: Not on file  Other Topics Concern  . Not on file  Social History Narrative  . Not on file    Review of Systems   See HPI All other systems reviewed and are otherwise negative except as noted above.  Physical Exam    Blood pressure 123/69, pulse 75, temperature 98.2 F (36.8 C), temperature source Oral, resp. rate (!) 28, SpO2 96 %.   General: Obese,  NAD Skin: Warm, dry, intact  Head: Normocephalic, atraumatic, moist mucus membranes. Neck: Negative for carotid bruits. No JVD Lungs:Clear to ausculation bilaterally. No wheezes, rales, or rhonchi. Breathing is unlabored. Cardiovascular: Irregularly irregular with S1 S2. + murmur. No rubs, gallops, or LV heave appreciated. Abdomen: Soft, non-tender, non-distended with normoactive bowel sounds. No obvious abdominal masses. MSK: Strength and tone appear normal for age. 5/5 in all extremities Extremities: No edema. No clubbing or cyanosis. DP/PT pulses 2+ bilaterally Neuro: Alert and oriented. No focal deficits. No facial asymmetry.  MAE spontaneously. Psych: Responds to questions appropriately with normal affect.    Labs    Troponin (Point of Care  Test) Recent Labs    07/02/18 1049  TROPIPOC 0.01   No results for input(s): CKTOTAL, CKMB, TROPONINI in the last 72 hours. Lab Results  Component Value Date   WBC 7.0 07/02/2018   HGB 10.2 (L) 07/02/2018   HCT 36.4 07/02/2018   MCV 62.7 (L) 07/02/2018   PLT 174 07/02/2018    Recent Labs  Lab 07/02/18 1030  NA 141  K 4.6  CL 110  CO2 22  BUN 15  CREATININE 1.15*  CALCIUM 9.4  PROT 7.0  BILITOT 0.5  ALKPHOS 80  ALT 45*  AST 45*  GLUCOSE 133*   Lab Results  Component Value Date   CHOL  05/14/2008    146        ATP III CLASSIFICATION:  <200     mg/dL   Desirable  200-239  mg/dL   Borderline High  >=240    mg/dL   High   HDL 27 (L) 05/14/2008   LDLCALC  05/14/2008    98        Total Cholesterol/HDL:CHD Risk Coronary Heart Disease Risk Table                     Men   Women  1/2 Average Risk   3.4   3.3   TRIG 105 05/14/2008   No results found for: [facility]   Radiology Studies    Dg Chest Port 1 View  Result Date: 07/02/2018 CLINICAL DATA:  Cough. EXAM: PORTABLE CHEST 1 VIEW COMPARISON:  12/04/2015 FINDINGS: There is peribronchial thickening with slight interstitial accentuation at the lung bases without acute infiltrates or effusions. Heart size and pulmonary vascularity are normal. No significant bone abnormality. IMPRESSION: Bronchitic changes. Electronically Signed   By: Lorriane Shire M.D.   On: 07/02/2018 11:08   ECG & Cardiac Imaging    EKG 07/02/18: Atrial fibrillation with RVR HR 151  Echocardiogram 09/24/16: Study Conclusions  - Left ventricle: The cavity size was normal. Wall thickness was   increased in a pattern of moderate LVH. Systolic function was   normal. The estimated ejection fraction was in the range of 55%   to 60%. Wall motion was normal; there were no regional wall   motion abnormalities. Left ventricular diastolic function   parameters were normal. - Aortic valve: There was very mild stenosis. - Mitral valve: nodular  calcification of anterior mitral leaflet.   Calcified annulus. Mildly thickened leaflets . - Left atrium: The atrium was moderately dilated. - Atrial septum: No defect or patent foramen ovale was identified.  Assessment & Plan    1.  Atrial fibrillation with RVR with c/o of chest pressure: -Pt presented with a two week period of SOB and fatigue which became acutely worse two days ago with associated chest pressure. She was found to be in AF with RVR after presenting to [facility] on 07/02/18.  -In the ED, she was started on diltiazem gtt with bolus with better HR control. She is prescribed diltiazem 90mg  TID, however takes it twice daily. Coumadin reported at 5 mg everyday except Thursdays in which she takes 7.5mg . INR monitored monthly by PCP monthly. She states that she has ranged from 2.0-3.0 over the last several months.  -EKG with atrial fibrillation and no acute changes -Trop, 0.01, currenly chest pressure free with no hx of CAD -INR today, 3.19 -Consider  adding low dose metoprolol and increasing home dose of diltiazem for greater rate control. Have her get INR checks weekly for at least 4 weeks and if symptoms controlled, consider OP DCCV.  -Will start metoprolol tartrate at 25mg  twice daily and reassess tomorrow. Consider transition to diltiazem CD 240mg  if tolerates well at d/c.   -CHA2DS2VASc =4 (age, sex, HTN, DM)  2.  HTN: -Stable, 123/69>137/66>122/79>134/65 -Continue on diltiazem -Start metoprolol 25mg  twice daily     3.  Mild Aortic stenosis: -Asymptomatic, mild by AS with mean AVG of 45mmHg per echocardiogram 09/24/16  4.  OSA: -Reports full CPAP compliance -Followed by Dr. Radford Pax    5.  Left carotid artery bruit: -Right carotid, no evidence of thrombus, dissection, atherosclerotic plaque or stenosis in the cervical carotid system -Left carotid, left ICA are consistent with a 1-39% stenosis  6. DM-2: -On home oral therapy -HbA1c, 7.0 from 2013>>>followed by PCP -[facility]  for glucose control while inpatient status    Severity of Illness: The appropriate patient status for this patient is OBSERVATION. Observation status is judged to be reasonable and necessary in order to provide the required intensity of service to ensure the patient's safety. The patient's presenting symptoms, physical exam findings, and initial radiographic and laboratory data in the context of their medical condition is felt to place them at decreased risk for further clinical deterioration. Furthermore, it is anticipated that the patient will be medically stable for discharge from the hospital within 2 midnights of admission. The following factors support the patient status of observation.   " The patient's presenting symptoms include SOB, dyspnea. " The physical exam findings include AF RVR. " The initial radiographic and laboratory data are AF RVR.    SignedKathyrn Drown NP-C [facility] Pager: [phone] 07/02/2018, @NOW    Patient seen and examined. Agree with assessment and plan.  Jenna Mcdonald is a very pleasant 71 year old female who is originally from [facility].  She has been in [place] since 1972.  She is followed by Dr. Golden Hurter and has a history of obstructive sleep apnea on BiPAP, hypertension, mild aortic stenosis by echo, morbid obesity, as well as atrial fibrillation which has been paroxysmal.  She has not had a recent EKG and apparently when last seen by Dr. Radford Pax on physical exam she was felt to be in regular rhythm but no ECG was done at that time.  Yesterday, the patient experienced increasing fatigability.She uses her BiPAP with 100% compliance.  She noted a sensation of chest fullness.  She has a heart rate monitor at home but apparently did not use it but today when she tested her heart rate it was reported to be 160 bpm prompting her emergency room evaluation.  In the emergency room she was treated with Cardizem bolus and drip and presently she  continues to be in atrial fibrillation with rate in the 120s.  She has been on warfarin for anticoagulation and INR today was therapeutic but she typically gets her blood checked on a monthly basis.  She has recently experienced some URI type symptoms and chest x-ray suggest bronchitic changes but did not reveal any acute cardiopulmonary findings.  Presently she is pain-free.  Heart rate is ranging from 115 to 125 bpm.  She is morbidly obese.  HEENT is unremarkable.  Mallampati scale is at least a 3.  She has a thick neck.  Lungs are clear.  There is a 2/6 systolic murmur.  There is questionable right carotid bruit  versus transmitted murmur from her aortic stenosis pulse irregularly irregular and tachycardic.  Abdomen is soft nontender without hepatosplenomegaly.  There is no significant edema clubbing or cyanosis.Neurologic exam is grossly nonfocal.  Prior to her ER presentation, the patient has been on regular strength Cardizem 90 mg twice a day.  ECG independently reviewed by me reveals atrial fibrillation with rapid ventricular rate at 151 bpm with inferolateral ST-T changes.  Laboratory is notable for potassium 4.6.  Creatinine 1.15.  Initial point-of-care troponin 0 0.01.  Hemoglobin 10.2, hematocrit 36.4.  INR 3.19.  Presently, we will continue the patient on intravenous Cardizem.  Will initiate beta-blocker therapy with metoprolol 25 mg every 12 hours.  Once IV Cardizem can be weaned change to Cardizem CD 240 mg daily.  Will start obtain serial troponin levels and follow-up ECG in a.m.  Check TSH.  Continue nocturnal BiPAP therapy while in the hospital.  Consider outpatient CT coronary angios to further evaluate her chest pain if troponins remain negative.  Suspect a nuclear study will be fraught with attenuation difficulties due to the patient's large body habitus.  Troy Sine, MD, [facility] [place] 07/02/2018 3:42 PM

## 2018-07-02 NOTE — Progress Notes (Signed)
ANTICOAGULATION CONSULT NOTE - Initial Consult  Pharmacy Consult for warfarin Indication: atrial fibrillation   Medical History: Past Medical History:  Diagnosis Date  . Anemia   . Aortic stenosis    mild by echo 08/2016  . Breast CA ([place])    left  . Carotid stenosis    1-39% left  . Carotid stenosis   . Degenerative disc disease, lumbar    of the knees  . Diabetes mellitus   . 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[place])   . Fatigue    Severe secondary to to gemfibrozil  . Gastroesophageal reflux disease    denies  . Hammertoe    left second toe  . Hypercholesteremia   . Hypertension   . Insomnia   . Irritable bowel   . Low back pain   . Metabolic syndrome   . Obesity   . OSA on CPAP \   bipap  . Osteoarthritis   . Osteopenia   . Persistent atrial fibrillation   . Personal history of radiation therapy   . Pneumonia   . PONV (postoperative nausea and vomiting)   . Sigmoid diverticulosis   . Thalassemia minor   . Vitamin D deficiency     Assessment: 72 yo female with persistent aFib. PTA warfarin: 7.5 mg on Thursdays, 5 mg on all other days Current INR 3.1.   Goal of Therapy:  INR 2-3 Monitor platelets by anticoagulation protocol: Yes    Plan:  -Warfarin 1 mg po x1 -Daily INR   Harvel Quale 07/02/2018,3:42 PM

## 2018-07-02 NOTE — ED Triage Notes (Signed)
Pt here from home with c/o chest pressure and feels like her heart is racing , pt was found to be in afib

## 2018-07-03 DIAGNOSIS — I4819 Other persistent atrial fibrillation: Principal | ICD-10-CM

## 2018-07-03 DIAGNOSIS — G4733 Obstructive sleep apnea (adult) (pediatric): Secondary | ICD-10-CM | POA: Diagnosis not present

## 2018-07-03 DIAGNOSIS — I10 Essential (primary) hypertension: Secondary | ICD-10-CM | POA: Diagnosis not present

## 2018-07-03 LAB — GLUCOSE, CAPILLARY
Glucose-Capillary: 138 mg/dL — ABNORMAL HIGH (ref 70–99)
Glucose-Capillary: 126 mg/dL — ABNORMAL HIGH (ref 70–99)
Glucose-Capillary: 183 mg/dL — ABNORMAL HIGH (ref 70–99)
Glucose-Capillary: 125 mg/dL — ABNORMAL HIGH (ref 70–99)

## 2018-07-03 LAB — TROPONIN I
Troponin I: <0.03 ng/mL (ref <0.03)
Troponin I: <0.03 ng/mL (ref <0.03)

## 2018-07-03 LAB — BASIC METABOLIC PANEL
Anion gap: 7 (ref 5–15)
BUN: 14 mg/dL (ref 8–23)
CO2: 20 mmol/L — ABNORMAL LOW (ref 22–32)
Calcium: 8.3 mg/dL — ABNORMAL LOW (ref 8.9–10.3)
Chloride: 113 mmol/L — ABNORMAL HIGH (ref 98–111)
Creatinine, Ser: 1.22 mg/dL — ABNORMAL HIGH (ref 0.44–1.00)
GFR calc Af Amer: 50 mL/min — ABNORMAL LOW (ref >60)
GFR calc non Af Amer: 43 mL/min — ABNORMAL LOW (ref >60)
Glucose, Bld: 138 mg/dL — ABNORMAL HIGH (ref 70–99)
Potassium: 4.5 mmol/L (ref 3.5–5.1)
Sodium: 140 mmol/L (ref 135–145)

## 2018-07-03 LAB — PROTIME-INR
INR: 2.24
Prothrombin Time: 24.5 seconds — ABNORMAL HIGH (ref 11.4–15.2)

## 2018-07-03 MED ORDER — ACETAMINOPHEN-CODEINE #3 300-30 MG PO TABS
1.0000 | ORAL_TABLET | Freq: Once | ORAL | Status: DC
  Filled 2018-07-03: qty 1

## 2018-07-03 MED ORDER — DILTIAZEM LOAD VIA INFUSION
10.0000 mg | Freq: Once | INTRAVENOUS | Status: DC
  Filled 2018-07-03: qty 10

## 2018-07-03 MED ORDER — ACETAMINOPHEN-CODEINE #2 300-15 MG PO TABS: 1.0000 | ORAL_TABLET | Freq: Once | ORAL | Status: DC

## 2018-07-03 MED ORDER — DILTIAZEM HCL 60 MG PO TABS
60.0000 mg | ORAL_TABLET | Freq: Four times a day (QID) | ORAL | Status: DC
  Administered 2018-07-03 – 2018-07-05 (×8): 60 mg via ORAL
  Filled 2018-07-03 (×8): qty 1

## 2018-07-03 MED ORDER — PHENOL 1.4 % MT LIQD
1.0000 | OROMUCOSAL | Status: DC | PRN
  Administered 2018-07-03: 1 via OROMUCOSAL
  Filled 2018-07-03: qty 177

## 2018-07-03 MED ORDER — DIPHENHYDRAMINE HCL 25 MG PO CAPS
25.0000 mg | ORAL_CAPSULE | Freq: Once | ORAL | Status: AC
  Administered 2018-07-03: 25 mg via ORAL
  Filled 2018-07-03: qty 1

## 2018-07-03 MED ORDER — WARFARIN SODIUM 7.5 MG PO TABS
7.5000 mg | ORAL_TABLET | Freq: Once | ORAL | Status: AC
  Administered 2018-07-03: 7.5 mg via ORAL
  Filled 2018-07-03: qty 1

## 2018-07-03 MED ORDER — DILTIAZEM LOAD VIA INFUSION
10.0000 mg | Freq: Once | INTRAVENOUS | Status: AC
  Administered 2018-07-03: 10 mg via INTRAVENOUS
  Filled 2018-07-03: qty 10

## 2018-07-03 NOTE — Progress Notes (Signed)
Patient c/o tightness to the lt side of her chest. Rated 5/10, EKG done and results communicated to the attending on-call. Vitals stable as well. No new orders at this time.will keep monitoring

## 2018-07-03 NOTE — Progress Notes (Signed)
Progress Note  Patient Name: Jenna Mcdonald Date of Encounter: 07/03/2018  Primary Cardiologist: Dr. Fransico Him  Subjective   Generally fatigued, short of breath with activity, mild intermittent cough.  No active chest pain or palpitations.  Inpatient Medications    Scheduled Meds: . diltiazem  10 mg Intravenous Once  . diltiazem  60 mg Oral Q6H  . insulin aspart  0-15 Units Subcutaneous TID WC  . metoprolol tartrate  25 mg Oral BID  . warfarin  7.5 mg Oral ONCE-1800  . Warfarin - Pharmacist Dosing Inpatient   Does not apply q1800   Continuous Infusions: . sodium chloride 125 mL/hr at 07/03/18 1105   PRN Meds: acetaminophen, guaiFENesin-dextromethorphan, ondansetron (ZOFRAN) IV, phenol   Vital Signs    Vitals:   07/02/18 2147 07/03/18 0023 07/03/18 0700 07/03/18 0847  BP:  106/69 113/66 113/66  Pulse: 97 82 (!) 105 (!) 116  Resp:  20 (!) 24   Temp: 99.2 F (37.3 C) 99.4 F (37.4 C) 98.9 F (37.2 C)   TempSrc: Oral Oral Oral   SpO2: 98% 97% 98%     Intake/Output Summary (Last 24 hours) at 07/03/2018 1224 Last data filed at 07/03/2018 0300 Gross per 24 hour  Intake 2078.18 ml  Output -  Net 2078.18 ml   There were no vitals filed for this visit.  Telemetry    Persistent atrial fibrillation, heart rate 100 215.  Personally reviewed.  ECG    Tracing from 07/02/2018 showed atrial fibrillation at 95 bpm.  Personally reviewed.  Physical Exam   GEN:  Obese woman, no acute distress.   Neck: No JVD. Cardiac:  Irregularly irregular, no gallop.  Respiratory: Nonlabored.  Breath sounds without wheezing. GI: Soft, nontender, bowel sounds present. MS: No edema; No deformity. Neuro:  Nonfocal. Psych: Alert and oriented x 3. Normal affect.  Labs    Chemistry Recent Labs  Lab 07/02/18 1030 07/03/18 0611  NA 141 140  K 4.6 4.5  CL 110 113*  CO2 22 20*  GLUCOSE 133* 138*  BUN 15 14  CREATININE 1.15* 1.22*  CALCIUM 9.4 8.3*  PROT 7.0  --     ALBUMIN 3.9  --   AST 45*  --   ALT 45*  --   ALKPHOS 80  --   BILITOT 0.5  --   GFRNONAA 46* 43*  GFRAA 54* 50*  ANIONGAP 9 7     Hematology Recent Labs  Lab 07/02/18 1030  WBC 7.0  RBC 5.81*  HGB 10.2*  HCT 36.4  MCV 62.7*  MCH 17.6*  MCHC 28.0*  RDW 20.3*  PLT 174    Cardiac Enzymes Recent Labs  Lab 07/02/18 1705 07/02/18 2259 07/03/18 0611  TROPONINI <0.03 <0.03 <0.03    Recent Labs  Lab 07/02/18 1049  TROPIPOC 0.01     Radiology    Dg Chest Port 1 View  Result Date: 07/02/2018 CLINICAL DATA:  Cough. EXAM: PORTABLE CHEST 1 VIEW COMPARISON:  12/04/2015 FINDINGS: There is peribronchial thickening with slight interstitial accentuation at the lung bases without acute infiltrates or effusions. Heart size and pulmonary vascularity are normal. No significant bone abnormality. IMPRESSION: Bronchitic changes. Electronically Signed   By: Lorriane Shire M.D.   On: 07/02/2018 11:08    Cardiac Studies   Echocardiogram 09/24/2016: Study Conclusions  - Left ventricle: The cavity size was normal. Wall thickness was   increased in a pattern of moderate LVH. Systolic function was   normal. The estimated ejection  fraction was in the range of 55%   to 60%. Wall motion was normal; there were no regional wall   motion abnormalities. Left ventricular diastolic function   parameters were normal. - Aortic valve: There was very mild stenosis. - Mitral valve: nodular calcification of anterior mitral leaflet.   Calcified annulus. Mildly thickened leaflets . - Left atrium: The atrium was moderately dilated. - Atrial septum: No defect or patent foramen ovale was identified.  Patient Profile     72 y.o. female with a history of paroxysmal atrial fibrillation on Coumadin and short acting Cardizem as an outpatient, carotid artery disease, very mild aortic stenosis, type 2 diabetes mellitus, hypertension, and hyperlipidemia.  She presents with recent suspected viral URI symptoms  and also documented recurrent atrial fibrillation with RVR associated with fatigue and shortness of breath.  Assessment & Plan    1.  Persistent, symptomatic atrial fibrillation, onset potentially Thursday. Patient states that she has rare palpitations and no prolonged recurrences, has never required an electrical cardioversion.  She reports compliance with her Coumadin and that INR was therapeutic with Dr. Inda Merlin last month.  Current INR is 2.2.  2.  Essential hypertension, moderate LVH by echocardiogram in 2018.  Pressure well controlled at this time.  3.  Recent viral URI.  Chest x-ray shows bronchitic changes but no infiltrates.  She is afebrile with normal white blood cell count.  4.  OSA on BiPAP.  I reviewed the patient's chart and assessment by Dr. Claiborne Billings.  Heart rate is still not well controlled.  Plan is to give a single Cardizem 10 mg IV bolus and then discontinue infusion with institution of Cardizem 60 mg p.o. every 6 hours.  Continue Lopressor for now although this may not be needed ultimately.  Since she states that she has converted spontaneously before, we will give her more time with better heart rate prior to considering electrical cardioversion.  We did talk about this procedure today and will keep her n.p.o. after midnight just in case.  Cardiac enzymes argue against ACS, no ischemic testing will be pursued at this particular time.  Follow-up CBC and BMET in a.m.  Coumadin per pharmacy.  Signed, Rozann Lesches, MD  07/03/2018, 12:24 PM

## 2018-07-03 NOTE — Progress Notes (Signed)
Family member to nurses station requesting to have dr paged- "what are we doing with my mom? She has been here all night, is still in afib"  This nurse spoke with pt, discussed symptoms and afib, and new medicines- cardiology paged- requested that pt walk in hall to monitor rate- Pt states that she is too dizzy to walk right now. Will attempt in a little bit.

## 2018-07-03 NOTE — Progress Notes (Signed)
[place] for warfarin Indication: atrial fibrillation    Assessment: 72 yo female with persistent aFib. PTA warfarin: 7.5 mg on Thursdays, 5 mg on all other days Admit INR = 3.19  INR today = 2.24   Goal of Therapy:  INR 2-3 Monitor platelets by anticoagulation protocol: Yes    Plan:  -Warfarin 7.5 mg po x 1 tonight -Daily INR  Thank you Anette Guarneri, PharmD [phone]  07/03/2018,11:39 AM

## 2018-07-03 NOTE — Progress Notes (Signed)
Called respiratory to ask about home C pap. RT for floor will be notified by [facility].

## 2018-07-03 NOTE — Care Management Obs Status (Signed)
[place] NOTIFICATION   Patient Details  Name: Jenna Mcdonald MRN: [id number] Date of Birth: 11-14-1945   Medicare Observation Status Notification Given:  Yes    Maryclare Labrador, RN 07/03/2018, 1:19 PM

## 2018-07-03 NOTE — Progress Notes (Signed)
Called pharm to inquire about returning the acetaminophen-codeine. As requested I am walking the drug to the pharmacy to return.

## 2018-07-03 NOTE — Progress Notes (Signed)
Page Dr Aldine Contes to report trend up in heart rate. Pt heart rate 120-150's. Plan is to continue with PO cardizem and monitor.

## 2018-07-03 NOTE — Progress Notes (Signed)
Called respiratory again and spoke with Nickie to request a check of the pt home cpap machine.

## 2018-07-03 NOTE — Progress Notes (Signed)
Paged Dr Aldine Contes and spoke with him about pt complaint of difficulty breathing, congestion and increased hr. He is coming to see pt.

## 2018-07-04 DIAGNOSIS — I4891 Unspecified atrial fibrillation: Secondary | ICD-10-CM | POA: Diagnosis not present

## 2018-07-04 DIAGNOSIS — D649 Anemia, unspecified: Secondary | ICD-10-CM

## 2018-07-04 DIAGNOSIS — G4733 Obstructive sleep apnea (adult) (pediatric): Secondary | ICD-10-CM | POA: Diagnosis not present

## 2018-07-04 DIAGNOSIS — I10 Essential (primary) hypertension: Secondary | ICD-10-CM | POA: Diagnosis not present

## 2018-07-04 LAB — BASIC METABOLIC PANEL
Anion gap: 6 (ref 5–15)
BUN: 14 mg/dL (ref 8–23)
CO2: 21 mmol/L — ABNORMAL LOW (ref 22–32)
Calcium: 8.1 mg/dL — ABNORMAL LOW (ref 8.9–10.3)
Chloride: 111 mmol/L (ref 98–111)
Creatinine, Ser: 1.09 mg/dL — ABNORMAL HIGH (ref 0.44–1.00)
GFR calc Af Amer: 57 mL/min — ABNORMAL LOW (ref >60)
GFR calc non Af Amer: 49 mL/min — ABNORMAL LOW (ref >60)
Glucose, Bld: 153 mg/dL — ABNORMAL HIGH (ref 70–99)
Potassium: 3.9 mmol/L (ref 3.5–5.1)
Sodium: 138 mmol/L (ref 135–145)

## 2018-07-04 LAB — CBC
HCT: 31.5 % — ABNORMAL LOW (ref 36.0–46.0)
Hemoglobin: 9.1 g/dL — ABNORMAL LOW (ref 12.0–15.0)
MCH: 17.7 pg — ABNORMAL LOW (ref 26.0–34.0)
MCHC: 28.9 g/dL — ABNORMAL LOW (ref 30.0–36.0)
MCV: 61.3 fL — ABNORMAL LOW (ref 80.0–100.0)
Platelets: 148 10*3/uL — ABNORMAL LOW (ref 150–400)
RBC: 5.14 MIL/uL — ABNORMAL HIGH (ref 3.87–5.11)
RDW: 19.5 % — ABNORMAL HIGH (ref 11.5–15.5)
WBC: 7.2 10*3/uL (ref 4.0–10.5)
nRBC: 0 % (ref 0.0–0.2)

## 2018-07-04 LAB — HEPARIN LEVEL (UNFRACTIONATED): Heparin Unfractionated: 0.11 IU/mL — ABNORMAL LOW (ref 0.30–0.70)

## 2018-07-04 LAB — PROTIME-INR
INR: 1.99
Prothrombin Time: 22.4 seconds — ABNORMAL HIGH (ref 11.4–15.2)

## 2018-07-04 LAB — GLUCOSE, CAPILLARY
Glucose-Capillary: 141 mg/dL — ABNORMAL HIGH (ref 70–99)
Glucose-Capillary: 193 mg/dL — ABNORMAL HIGH (ref 70–99)
Glucose-Capillary: 133 mg/dL — ABNORMAL HIGH (ref 70–99)

## 2018-07-04 LAB — OCCULT BLOOD X 1 CARD TO LAB, STOOL: Fecal Occult Bld: POSITIVE — AB

## 2018-07-04 MED ORDER — HEPARIN (PORCINE) 25000 UT/250ML-% IV SOLN
1550.0000 [IU]/h | INTRAVENOUS | Status: DC
  Administered 2018-07-04: 1200 [IU]/h via INTRAVENOUS
  Administered 2018-07-05: 1500 [IU]/h via INTRAVENOUS
  Filled 2018-07-04 (×2): qty 250

## 2018-07-04 MED ORDER — SODIUM CHLORIDE 0.9% FLUSH: 3.0000 mL | INTRAVENOUS | Status: DC | PRN

## 2018-07-04 MED ORDER — SODIUM CHLORIDE 0.9% FLUSH
3.0000 mL | Freq: Two times a day (BID) | INTRAVENOUS | Status: DC
  Administered 2018-07-04 – 2018-07-06 (×3): 3 mL via INTRAVENOUS

## 2018-07-04 MED ORDER — WARFARIN SODIUM 5 MG PO TABS
5.0000 mg | ORAL_TABLET | Freq: Once | ORAL | Status: AC
  Administered 2018-07-04: 5 mg via ORAL
  Filled 2018-07-04: qty 1

## 2018-07-04 MED ORDER — OFF THE BEAT BOOK
Freq: Once | Status: AC
  Administered 2018-07-04: 23:00:00
  Filled 2018-07-04: qty 1

## 2018-07-04 MED ORDER — SODIUM CHLORIDE 0.9 % IV SOLN: 250.0000 mL | INTRAVENOUS | Status: DC

## 2018-07-04 NOTE — Progress Notes (Addendum)
Progress Note  Patient Name: Jenna Mcdonald Date of Encounter: 07/04/2018  Primary Cardiologist: Fransico Him, MD   Subjective   Still having frequent palpitations. Says she experienced worsening dyspnea yesterday with minimal activity which scared her. No chest pain overnight or this morning.   Inpatient Medications    Scheduled Meds: . diltiazem  60 mg Oral Q6H  . insulin aspart  0-15 Units Subcutaneous TID WC  . metoprolol tartrate  25 mg Oral BID  . Warfarin - Pharmacist Dosing Inpatient   Does not apply q1800   Continuous Infusions:  PRN Meds: acetaminophen, guaiFENesin-dextromethorphan, ondansetron (ZOFRAN) IV, phenol   Vital Signs    Vitals:   07/03/18 1732 07/03/18 2100 07/04/18 0042 07/04/18 0645  BP: 136/66 135/66 133/70 121/78  Pulse: (!) 108 92 84 (!) 106  Resp: 16 18 20 18   Temp: 98.4 F (36.9 C) 99.9 F (37.7 C) 98.1 F (36.7 C) 99.7 F (37.6 C)  TempSrc: Oral  Oral Oral  SpO2: 98% 97% 97% 96%  Weight:  114.2 kg  113.7 kg    Intake/Output Summary (Last 24 hours) at 07/04/2018 0812 Last data filed at 07/04/2018 0647 Gross per 24 hour  Intake 240 ml  Output 1000 ml  Net -760 ml   Filed Weights   07/03/18 2100 07/04/18 0645  Weight: 114.2 kg 113.7 kg    Telemetry    Atrial fibrillation, HR in 80's to 140's.  - Personally Reviewed  ECG    Atrial fibrillation, HR 97, with TWI along inferior and lateral leads. - Personally Reviewed  Physical Exam   General: Well developed, well nourished Caucasian female appearing in no acute distress. Head: Normocephalic, atraumatic.  Neck: Supple without bruits, JVD not elevated. Lungs:  Resp regular and unlabored, CTA without wheezing or rales. Heart: Irregularly irregular, S1, S2, no S3, S4, or murmur; no rub. Abdomen: Soft, non-tender, non-distended with normoactive bowel sounds. No hepatomegaly. No rebound/guarding. No obvious abdominal masses. Extremities: No clubbing or cyanosis, trace lower  extremity edema. Distal pedal pulses are 2+ bilaterally. Neuro: Alert and oriented X 3. Moves all extremities spontaneously. Psych: Normal affect.  Labs    Chemistry Recent Labs  Lab 07/02/18 1030 07/03/18 0611 07/04/18 0457  NA 141 140 138  K 4.6 4.5 3.9  CL 110 113* 111  CO2 22 20* 21*  GLUCOSE 133* 138* 153*  BUN 15 14 14   CREATININE 1.15* 1.22* 1.09*  CALCIUM 9.4 8.3* 8.1*  PROT 7.0  --   --   ALBUMIN 3.9  --   --   AST 45*  --   --   ALT 45*  --   --   ALKPHOS 80  --   --   BILITOT 0.5  --   --   GFRNONAA 46* 43* 49*  GFRAA 54* 50* 57*  ANIONGAP 9 7 6      Hematology Recent Labs  Lab 07/02/18 1030 07/04/18 0457  WBC 7.0 7.2  RBC 5.81* 5.14*  HGB 10.2* 9.1*  HCT 36.4 31.5*  MCV 62.7* 61.3*  MCH 17.6* 17.7*  MCHC 28.0* 28.9*  RDW 20.3* 19.5*  PLT 174 148*    Cardiac Enzymes Recent Labs  Lab 07/02/18 1705 07/02/18 2259 07/03/18 0611  TROPONINI <0.03 <0.03 <0.03    Recent Labs  Lab 07/02/18 1049  TROPIPOC 0.01     BNPNo results for input(s): BNP, PROBNP in the last 168 hours.   DDimer No results for input(s): DDIMER in the last 168 hours.  Radiology    Dg Chest Port 1 View  Result Date: 07/02/2018 CLINICAL DATA:  Cough. EXAM: PORTABLE CHEST 1 VIEW COMPARISON:  12/04/2015 FINDINGS: There is peribronchial thickening with slight interstitial accentuation at the lung bases without acute infiltrates or effusions. Heart size and pulmonary vascularity are normal. No significant bone abnormality. IMPRESSION: Bronchitic changes. Electronically Signed   By: Lorriane Shire M.D.   On: 07/02/2018 11:08    Cardiac Studies   Echocardiogram: 08/2016 Study Conclusions  - Left ventricle: The cavity size was normal. Wall thickness was   increased in a pattern of moderate LVH. Systolic function was   normal. The estimated ejection fraction was in the range of 55%   to 60%. Wall motion was normal; there were no regional wall   motion abnormalities. Left  ventricular diastolic function   parameters were normal. - Aortic valve: There was very mild stenosis. - Mitral valve: nodular calcification of anterior mitral leaflet.   Calcified annulus. Mildly thickened leaflets . - Left atrium: The atrium was moderately dilated. - Atrial septum: No defect or patent foramen ovale was identified.  Patient Profile     72 y.o. female w/ PMH of PAF (on Coumadin), carotid artery stenosis, mild AD, HTN, HLD, and Type 2 DM who presented to [facility] ED on 07/02/2018 for worsening URI symptoms and chest pain, found to be in atrial fibrillation with RVR.   Assessment & Plan    1. Persistent Atrial Fibrillation with RVR - presented for new-onset chest pressure and palpitations after having a recent viral URI. Troponin values negative. TSH and electrolytes WNL. She was admitted and initially started on IV Cardizem but this has been transitioned to short-acting Cardizem (on Cardizem 90mg  BID PTA) and Lopressor added to her regimen.  Titration of AV nodal blocking agents has been somewhat limited by low-normal BP and HR is still elevated into the 140's with minimal activity. In the 80's to 90's with rest.  - discussed options with the patient including rate-control versus rhythm control and she is still symptomatic and wishes to be back in NSR. INR had been therapeutic as an outpatient but was at 1.99 this AM. Will continue Coumadin and bridge with Heparin. Will discuss with Dr. Domenic Polite the indication for a TEE-guided DCCV as compared to original plan of DCCV alone. This was reviewed with the patient as well. Unable to schedule her procedure for tomorrow and will send a message to the Card Master so this can be officially scheduled tomorrow. Procedural orders entered along with NPO after midnight.    2. HTN: - BP has been low-normal at 113/66 - 136/78 within the past 24 hours.  - currently receiving PO Cardizem 60mg  Q6H and Lopressor 25mg  BID.   3. Aortic Stenosis -  mild by echocardiogram in 08/2016. Continue to follow as an outpatient.   4. OSA - followed by Dr. Radford Pax as an outpatient. Patient has reported compliance with CPAP as an outpatient.   5. Left carotid artery bruit  - recent carotid dopplers this year showed 1-39% stenosis. No further testing indicated at this time.  6. Type 2 DM - on Metformin and Glimepiride PTA. Has been held with SSI ordered.    For questions or updates, please contact [place] Please consult [email or web address] for contact info under Cardiology/STEMI.   Arna Medici , PA-C 8:12 AM 07/04/2018 Pager: [phone]   Attending note:  Patient seen and examined.  Reviewed interval course and discussed the case with Ms. Ahmed Prima  PA-C.  Ms. Paulo Fruit remains in atrial fibrillation, heart rates are controlled at rest, but increase significantly with activity and she is symptomatic in terms of weakness and shortness of breath.  On examination this morning she appears comfortable eating breakfast.  Heart rate at rest is in the 80s in atrial fibrillation by telemetry.  Systolic blood pressure 960A.  Lungs are clear without labored breathing.  Cardiac exam reveals irregularly irregular rhythm.  Lab work shows potassium 3.9, BUN 14, creatinine 1.09, hemoglobin 9.1, platelets 148, troponin I levels negative x3.  INR is just subtherapeutic at 1.99.  I personally reviewed her ECG which shows atrial fibrillation with diffuse nonspecific ST-T changes.  Persistent atrial fibrillation, symptomatic.  Plan at this time is to continue short acting Cardizem and Lopressor as well as Coumadin per pharmacy.  Will bridge with heparin with follow-up INR tomorrow in anticipation of TEE guided cardioversion.  Of note, she is anemic, no obvious bleeding problems, hemoglobin is 9.1, was 10.2 at presentation and prior to that 11 in 2017.  Would guaiac stools, follow-up CBC a.m.  We will keep her n.p.o. after midnight.  Satira Sark, M.D., F.A.C.C.

## 2018-07-04 NOTE — Progress Notes (Addendum)
ANTICOAGULATION CONSULT NOTE - Follow Up Consult  Pharmacy Consult for Warfarin/Heparin Indication: atrial fibrillation  Allergies  Allergen Reactions  . Aromasin [Exemestane]     Hair loss   . Femara [Letrozole]     unknown  . Gemfibrozil Other (See Comments)    Other reaction(s): Other (See Comments) Unknown Severe fatigue  Other reaction(s): Other (See Comments) Unknown Severe fatigue Unknown  . Hydrocodone-Acetaminophen Nausea And Vomiting    Patient reports terrible nausea; plain tylenol is ok with patient  . Letrozole Other (See Comments)    Unknown reactions   . Metoprolol Diarrhea    Fatigue   . Other Other (See Comments)    Other reaction(s): Other (See Comments) Numerous cancer drugs - unknown reaction Other reaction(s): Other (See Comments) Numerous cancer drugs - unknown reaction Numerous cancer drugs - unknown reaction  . Tamoxifen     Bone and body aches   . Tetracycline Other (See Comments)    Other reaction(s): Other (See Comments) Unknown reaction Other reaction(s): Other (See Comments) Unknown reaction Unknown reaction  . Tetracyclines & Related Other (See Comments)    Vaginal infections    . Toprol Xl [Metoprolol Tartrate]     Hair loss  . Verapamil Other (See Comments)    Other reaction(s): Other (See Comments) Unknown reaction Other reaction(s): Other (See Comments) Unknown reaction Unknown reaction  . Vicodin [Hydrocodone-Acetaminophen] Nausea And Vomiting    Patient reports terrible nausea; plain tylenol is ok with patient    Patient Measurements: Weight: 250 lb 10.6 oz (113.7 kg) Heparin Dosing Weight: 84 kg  Vital Signs: Temp: 99.7 F (37.6 C) (11/10 0645) Temp Source: Oral (11/10 0645) BP: 121/78 (11/10 0645) Pulse Rate: 106 (11/10 0645)  Labs: Recent Labs    07/02/18 1030 07/02/18 1705 07/02/18 2259 07/03/18 0611 07/04/18 0457  HGB 10.2*  --   --   --  9.1*  HCT 36.4  --   --   --  31.5*  PLT 174  --   --   --   148*  LABPROT 32.2*  --   --  24.5* 22.4*  INR 3.19  --   --  2.24 1.99  CREATININE 1.15*  --   --  1.22* 1.09*  TROPONINI  --  <0.03 <0.03 <0.03  --     CrCl cannot be calculated (Unknown ideal weight.).   Medications:  Scheduled:  . diltiazem  60 mg Oral Q6H  . insulin aspart  0-15 Units Subcutaneous TID WC  . metoprolol tartrate  25 mg Oral BID  . sodium chloride flush  3 mL Intravenous Q12H  . Warfarin - Pharmacist Dosing Inpatient   Does not apply q1800    Assessment: 72 YO female with persistent afib on Coumadin PTA. PTA warfarin: 7.5 mg on Thursdays, 5 mg all other days. Admit INR 3.19. Pharmacy has been consulted continue warfarin and bridge with heparin for TEE/DCCV tomorrow. Today's INR is 1.99. CBC stable.  Goal of Therapy:  Heparin level 0.3-0.7 units/ml Monitor platelets by anticoagulation protocol: Yes   Plan:  Start heparin infusion at 1200 units/hr Warfarin 5 mg PO x1 tonight Check anti-Xa level in 8 hours and daily while on heparin Continue to monitor H&H and platelets Daily INR  Jackson Latino, PharmD PGY1 Pharmacy Resident Phone [phone] 07/04/2018     9:01 AM

## 2018-07-04 NOTE — Progress Notes (Addendum)
ANTICOAGULATION CONSULT NOTE - Follow Up Consult  Pharmacy Consult for Warfarin/Heparin Indication: atrial fibrillation  Allergies  Allergen Reactions  . Aromasin [Exemestane]     Hair loss   . Femara [Letrozole]     unknown  . Gemfibrozil Other (See Comments)    Other reaction(s): Other (See Comments) Unknown Severe fatigue  Other reaction(s): Other (See Comments) Unknown Severe fatigue Unknown  . Hydrocodone-Acetaminophen Nausea And Vomiting    Patient reports terrible nausea; plain tylenol is ok with patient  . Letrozole Other (See Comments)    Unknown reactions   . Metoprolol Diarrhea    Fatigue   . Other Other (See Comments)    Other reaction(s): Other (See Comments) Numerous cancer drugs - unknown reaction Other reaction(s): Other (See Comments) Numerous cancer drugs - unknown reaction Numerous cancer drugs - unknown reaction  . Tamoxifen     Bone and body aches   . Tetracycline Other (See Comments)    Other reaction(s): Other (See Comments) Unknown reaction Other reaction(s): Other (See Comments) Unknown reaction Unknown reaction  . Tetracyclines & Related Other (See Comments)    Vaginal infections    . Toprol Xl [Metoprolol Tartrate]     Hair loss  . Verapamil Other (See Comments)    Other reaction(s): Other (See Comments) Unknown reaction Other reaction(s): Other (See Comments) Unknown reaction Unknown reaction  . Vicodin [Hydrocodone-Acetaminophen] Nausea And Vomiting    Patient reports terrible nausea; plain tylenol is ok with patient    Patient Measurements: Weight: 250 lb 10.6 oz (113.7 kg) Heparin Dosing Weight: 84 kg  Vital Signs: Temp: 98.6 F (37 C) (11/10 1720) Temp Source: Oral (11/10 1720) BP: 138/66 (11/10 1720) Pulse Rate: 102 (11/10 1720)  Labs: Recent Labs    07/02/18 1030 07/02/18 1705 07/02/18 2259 07/03/18 0611 07/04/18 0457 07/04/18 1842  HGB 10.2*  --   --   --  9.1*  --   HCT 36.4  --   --   --  31.5*  --    PLT 174  --   --   --  148*  --   LABPROT 32.2*  --   --  24.5* 22.4*  --   INR 3.19  --   --  2.24 1.99  --   HEPARINUNFRC  --   --   --   --   --  0.11*  CREATININE 1.15*  --   --  1.22* 1.09*  --   TROPONINI  --  <0.03 <0.03 <0.03  --   --     CrCl cannot be calculated (Unknown ideal weight.).   Medications:  Scheduled:  . diltiazem  60 mg Oral Q6H  . insulin aspart  0-15 Units Subcutaneous TID WC  . metoprolol tartrate  25 mg Oral BID  . sodium chloride flush  3 mL Intravenous Q12H  . Warfarin - Pharmacist Dosing Inpatient   Does not apply q1800    Assessment: 72 YO female with persistent afib on Coumadin PTA. PTA warfarin: 7.5 mg on Thursdays, 5 mg all other days. Admit INR 3.19. Pharmacy has been consulted continue warfarin and bridge with heparin for TEE/DCCV tomorrow. Today's INR is 1.99. CBC stable.  Initial heparin level subtherapeutic at 0.11. With upcoming TEE-DCCV will increase aggressively but no bolus as INR was 1.99  Goal of Therapy:  Heparin level 0.3-0.7 units/ml Monitor platelets by anticoagulation protocol: Yes   Plan:  -Increase heparin to 1500 units/h -Recheck heparin level with morning labs  Arrie Senate,  PharmD, BCPS Clinical Pharmacist [phone] Please check AMION for all [place] numbers 07/04/2018

## 2018-07-05 ENCOUNTER — Encounter (HOSPITAL_COMMUNITY)
Admission: EM | Disposition: A | Discharge status: Home / Self Care | Payer: Self-pay | Source: Home / Self Care | Attending: Cardiovascular Disease

## 2018-07-05 ENCOUNTER — Ambulatory Visit (HOSPITAL_COMMUNITY): Payer: Medicare Other

## 2018-07-05 ENCOUNTER — Encounter (HOSPITAL_COMMUNITY): Payer: Self-pay | Admitting: Cardiology

## 2018-07-05 DIAGNOSIS — I10 Essential (primary) hypertension: Secondary | ICD-10-CM | POA: Diagnosis not present

## 2018-07-05 DIAGNOSIS — I4891 Unspecified atrial fibrillation: Secondary | ICD-10-CM | POA: Diagnosis not present

## 2018-07-05 DIAGNOSIS — G4733 Obstructive sleep apnea (adult) (pediatric): Secondary | ICD-10-CM | POA: Diagnosis not present

## 2018-07-05 LAB — PROTIME-INR
INR: 2.04
Prothrombin Time: 22.7 seconds — ABNORMAL HIGH (ref 11.4–15.2)

## 2018-07-05 LAB — IRON AND TIBC
Iron: 16 ug/dL — ABNORMAL LOW (ref 28–170)
Saturation Ratios: 4 % — ABNORMAL LOW (ref 10.4–31.8)
TIBC: 395 ug/dL (ref 250–450)
UIBC: 379 ug/dL

## 2018-07-05 LAB — CBC
HCT: 31.9 % — ABNORMAL LOW (ref 36.0–46.0)
Hemoglobin: 9.2 g/dL — ABNORMAL LOW (ref 12.0–15.0)
MCH: 18 pg — ABNORMAL LOW (ref 26.0–34.0)
MCHC: 28.8 g/dL — ABNORMAL LOW (ref 30.0–36.0)
MCV: 62.3 fL — ABNORMAL LOW (ref 80.0–100.0)
Platelets: 150 10*3/uL (ref 150–400)
RBC: 5.12 MIL/uL — ABNORMAL HIGH (ref 3.87–5.11)
RDW: 19.8 % — ABNORMAL HIGH (ref 11.5–15.5)
WBC: 7.5 10*3/uL (ref 4.0–10.5)
nRBC: 0 % (ref 0.0–0.2)

## 2018-07-05 LAB — GLUCOSE, CAPILLARY
Glucose-Capillary: 133 mg/dL — ABNORMAL HIGH (ref 70–99)
Glucose-Capillary: 118 mg/dL — ABNORMAL HIGH (ref 70–99)
Glucose-Capillary: 119 mg/dL — ABNORMAL HIGH (ref 70–99)
Glucose-Capillary: 130 mg/dL — ABNORMAL HIGH (ref 70–99)

## 2018-07-05 LAB — RETICULOCYTES
Immature Retic Fract: 13.1 % (ref 2.3–15.9)
RBC.: 5.38 MIL/uL — ABNORMAL HIGH (ref 3.87–5.11)
Retic Count, Absolute: 60.3 10*3/uL (ref 19.0–186.0)
Retic Ct Pct: 1.1 % (ref 0.4–3.1)

## 2018-07-05 LAB — FERRITIN: Ferritin: 14 ng/mL (ref 11–307)

## 2018-07-05 LAB — BASIC METABOLIC PANEL
Anion gap: 8 (ref 5–15)
BUN: 16 mg/dL (ref 8–23)
CO2: 22 mmol/L (ref 22–32)
Calcium: 8.2 mg/dL — ABNORMAL LOW (ref 8.9–10.3)
Chloride: 110 mmol/L (ref 98–111)
Creatinine, Ser: 1.21 mg/dL — ABNORMAL HIGH (ref 0.44–1.00)
GFR calc Af Amer: 51 mL/min — ABNORMAL LOW (ref >60)
GFR calc non Af Amer: 44 mL/min — ABNORMAL LOW (ref >60)
Glucose, Bld: 139 mg/dL — ABNORMAL HIGH (ref 70–99)
Potassium: 4.5 mmol/L (ref 3.5–5.1)
Sodium: 140 mmol/L (ref 135–145)

## 2018-07-05 LAB — HEPARIN LEVEL (UNFRACTIONATED): Heparin Unfractionated: 0.28 IU/mL — ABNORMAL LOW (ref 0.30–0.70)

## 2018-07-05 LAB — FOLATE: Folate: 49.8 ng/mL (ref >5.9)

## 2018-07-05 LAB — VITAMIN B12: Vitamin B-12: 595 pg/mL (ref 180–914)

## 2018-07-05 SURGERY — CANCELLED PROCEDURE

## 2018-07-05 MED ORDER — DIPHENHYDRAMINE HCL 25 MG PO CAPS
25.0000 mg | ORAL_CAPSULE | Freq: Once | ORAL | Status: AC
  Administered 2018-07-05: 25 mg via ORAL
  Filled 2018-07-05: qty 1

## 2018-07-05 MED ORDER — WARFARIN SODIUM 7.5 MG PO TABS: 7.5000 mg | ORAL_TABLET | ORAL | Status: DC

## 2018-07-05 MED ORDER — WARFARIN SODIUM 5 MG PO TABS: 5.0000 mg | ORAL_TABLET | ORAL | Status: DC

## 2018-07-05 MED ORDER — DILTIAZEM HCL 60 MG PO TABS
90.0000 mg | ORAL_TABLET | Freq: Four times a day (QID) | ORAL | Status: DC
  Administered 2018-07-05 – 2018-07-07 (×10): 90 mg via ORAL
  Filled 2018-07-05 (×10): qty 2

## 2018-07-05 MED ORDER — PEG 3350-KCL-NA BICARB-NACL 420 G PO SOLR
4000.0000 mL | Freq: Once | ORAL | Status: AC
  Administered 2018-07-05: 4000 mL via ORAL
  Filled 2018-07-05: qty 4000

## 2018-07-05 MED ORDER — SODIUM CHLORIDE 0.9 % IV SOLN: INTRAVENOUS | Status: DC

## 2018-07-05 NOTE — Plan of Care (Signed)

## 2018-07-05 NOTE — Consult Note (Signed)
Referring Provider: Dr. Radford Pax Primary Care Physician:  Josetta Huddle, MD Primary Gastroenterologist:  Althia Forts  Reason for Consultation:  Anemia; Heme positive stool  HPI: Jenna Mcdonald is a 72 y.o. female with Afib and RVR with plan for TEE cardioversion that is on hold due to worsening anemia and heme positive stool. On chronic Coumadin due to Afib. Also history of aortic stenosis and DM. 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 At admit Hgb 10.2 on 07/02/18 and yesterday it was 9.1 and now 9.2. +Fatigue that she attributes to her Afib. +N without vomiting. Denies melena, heartburn. Reports having a colonoscopy by Dr. Wynetta Emery in the past but not found in outpt EMR chart and last visit to Dr. Wynetta Emery was in 2011. INR on admit 3.19 and now 2.04. Denies abdominal pain. Sitting up on side of bed and wants to eat.  Past Medical History:  Diagnosis Date  . Anemia   . Aortic stenosis    mild by echo 08/2016  . Breast CA ([place])    left  . Carotid stenosis    1-39% left  . Carotid stenosis   . Degenerative disc disease, lumbar    of the knees  . Diabetes mellitus   . 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[place])   . Fatigue    Severe secondary to to gemfibrozil  . Gastroesophageal reflux disease    denies  . Hammertoe    left second toe  . Hypercholesteremia   . Hypertension   . Insomnia   . Irritable bowel   . Low back pain   . Metabolic syndrome   . Obesity   . OSA on CPAP \   bipap  . Osteoarthritis   . Osteopenia   . Persistent atrial fibrillation   . Personal history of radiation therapy   . Pneumonia   . PONV (postoperative nausea and vomiting)   . Sigmoid diverticulosis   . Thalassemia minor   . Vitamin D deficiency     Past Surgical History:  Procedure Laterality Date  . BREAST LUMPECTOMY Left 05/28/2009  . BREAST SURGERY Left 10   lumpectomy  . CESAREAN SECTION    . CYSTECTOMY  70   pilonidal   . DILATION AND CURETTAGE OF UTERUS    . EYE SURGERY  Bilateral 12/14   cataracts  . MAXIMUM ACCESS (MAS)POSTERIOR LUMBAR INTERBODY FUSION (PLIF) 2 LEVEL N/A 09/22/2013   Procedure: FOR MAXIMUM ACCESS  POSTERIOR LUMBAR INTERBODY FUSION LUMBAR THREE-FOUR FOUR-FIVE;  Surgeon: Eustace Moore, MD;  Location: [facility];  Service: Neurosurgery;  Laterality: N/A;  . OOPHORECTOMY     wedge section not rem  . 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 REPLACEMENT TOTAL KNEE    . TONSILLECTOMY    . TUBAL LIGATION      Prior to Admission medications   Medication Sig Start Date End Date Taking? Authorizing Provider  acetaminophen (TYLENOL) 500 MG tablet Take 500 mg by mouth every 6 (six) hours as needed for mild pain. For back pain   Yes [provider]  Biotin 10 MG TABS Take 10 mg by mouth 2 (two) times daily.   Yes [provider]  Calcium Carbonate-Vitamin D (CALCIUM 600 + D PO) Take 1 tablet by mouth daily.    Yes [provider]  clobetasol (TEMOVATE) 0.05 % external solution Apply 1 application topically 2 (two) times daily. 06/04/18  Yes [provider]  diltiazem (CARDIZEM) 90 MG tablet TAKE 1 TABLET (90  MG TOTAL) BY MOUTH 3 (THREE) TIMES DAILY. Patient taking differently: Take 90 mg by mouth 2 (two) times daily.  10/02/15  Yes Turner, Eber Hong, MD  diphenhydramine-acetaminophen (TYLENOL PM) 25-500 MG TABS tablet Take 1 tablet by mouth at bedtime.   Yes [provider]  glimepiride (AMARYL) 4 MG tablet Take 2 mg by mouth daily.  10/29/15  Yes [provider]  metFORMIN (GLUCOPHAGE-XR) 500 MG 24 hr tablet Take 1 tablet by mouth 2 (two) times daily. 10/31/14  Yes [provider]  Multiple Vitamin (MULITIVITAMIN WITH MINERALS) TABS Take 1 tablet by mouth daily.   Yes [provider]  triamcinolone cream (KENALOG) 0.1 % Apply 1 application topically 2 (two) times daily. 06/26/18  Yes [provider]  warfarin (COUMADIN) 5 MG tablet Take 5 mg by mouth See admin instructions. 7.5 mg   Thursday. 5mg  all the other days   Yes [provider]  flecainide (TAMBOCOR) 50 MG tablet Take 1 tablet (50 mg total) by mouth every 12 (twelve) hours. 09/14/11 11/11/11  Jacolyn Reedy, MD    Scheduled Meds: . diltiazem  90 mg Oral Q6H  . insulin aspart  0-15 Units Subcutaneous TID WC  . metoprolol tartrate  25 mg Oral BID  . polyethylene glycol-electrolytes  4,000 mL Oral Once  . sodium chloride flush  3 mL Intravenous Q12H  . Warfarin - Pharmacist Dosing Inpatient   Does not apply q1800   Continuous Infusions: . sodium chloride    . sodium chloride     PRN Meds:.acetaminophen, guaiFENesin-dextromethorphan, ondansetron (ZOFRAN) IV, phenol, sodium chloride flush  Allergies as of 07/02/2018 - Review Complete 07/02/2018  Allergen Reaction Noted  . Aromasin [exemestane]  11/17/2013  . Femara [letrozole]  11/17/2013  . Gemfibrozil Other (See Comments) 11/17/2013  . Hydrocodone-acetaminophen Nausea And Vomiting 09/11/2011  . Letrozole Other (See Comments) 09/11/2011  . Metoprolol Diarrhea 09/11/2011  . Other Other (See Comments) 05/17/2014  . Tamoxifen  09/11/2011  . Tetracycline Other (See Comments) 05/17/2014  . Tetracyclines & related Other (See Comments) 09/11/2011  . Toprol xl [metoprolol tartrate]  11/17/2013  . Verapamil Other (See Comments) 05/17/2014  . Vicodin [hydrocodone-acetaminophen] Nausea And Vomiting 09/11/2011    Family History  Problem Relation Age of Onset  . Anesthesia problems Mother   . Diabetes Mother   . Diabetes Father     Social History   Socioeconomic History  . Marital status: Single    Spouse name: Not on file  . Number of children: Not on file  . Years of education: Not on file  . Highest education level: Not on file  Occupational History  . Not on file  Social Needs  . Financial resource strain: Not on file  . Food insecurity:    Worry: Not on file    Inability: Not on file  . Transportation needs:    Medical: Not on  file    Non-medical: Not on file  Tobacco Use  . Smoking status: Former Smoker    Packs/day: 0.50    Years: 10.00    Pack years: 5.00    Types: Cigarettes    Last attempt to quit: 11/12/2003    Years since quitting: 14.6  . Smokeless tobacco: Never Used  Substance and Sexual Activity  . Alcohol use: Yes  . Drug use: No  . Sexual activity: Never    Birth control/protection: Post-menopausal  Lifestyle  . Physical activity:    Days per week: Not on file  Minutes per session: Not on file  . Stress: Not on file  Relationships  . Social connections:    Talks on phone: Not on file    Gets together: Not on file    Attends religious service: Not on file    Active member of club or organization: Not on file    Attends meetings of clubs or organizations: Not on file    Relationship status: Not on file  . Intimate partner violence:    Fear of current or ex partner: Not on file    Emotionally abused: Not on file    Physically abused: Not on file    Forced sexual activity: Not on file  Other Topics Concern  . Not on file  Social History Narrative  . Not on file    Review of Systems: All negative except as stated above in HPI.  Physical Exam: Vital signs: Vitals:   07/04/18 2055 07/05/18 0637  BP: 134/72 131/69  Pulse:  (!) 117  Resp: 18 18  Temp:  98.4 F (36.9 C)  SpO2: 94% 93%   Last BM Date: 07/04/18 General:   Alert,  Morbidly obese, no acute distress Head: normocephalic, atraumatic Eyes: anicteric sclera ENT: oropharynx clear Neck: supple, nontender Lungs:  Clear throughout to auscultation.   No wheezes, crackles, or rhonchi. No acute distress. Heart:  Regular rate and rhythm; no murmurs, clicks, rubs,  or gallops. Abdomen: soft, nontender, nondistended, +BS  Rectal:  Deferred Ext: no edema  GI:  Lab Results: Recent Labs    07/04/18 0457 07/05/18 0515  WBC 7.2 7.5  HGB 9.1* 9.2*  HCT 31.5* 31.9*  PLT 148* 150   BMET Recent Labs    07/03/18 0611  07/04/18 0457 07/05/18 0515  NA 140 138 140  K 4.5 3.9 4.5  CL 113* 111 110  CO2 20* 21* 22  GLUCOSE 138* 153* 139*  BUN 14 14 16   CREATININE 1.22* 1.09* 1.21*  CALCIUM 8.3* 8.1* 8.2*   LFT No results for input(s): PROT, ALBUMIN, AST, ALT, ALKPHOS, BILITOT, BILIDIR, IBILI in the last 72 hours. PT/INR Recent Labs    07/04/18 0457 07/05/18 0515  LABPROT 22.4* 22.7*  INR 1.99 2.04     Studies/Results: No results found.  Impression/Plan: Acute on chronic anemia and heme positive stool in the setting of Afib and RVR on Coumadin. Heparin IV stopped this morning. Dose of Coumadin given yesterday but today's dose on hold for GI workup. EGD/colonoscopy planned for tomorrow morning by Dr. Watt Climes. Colon prep this afternoon. Clear liquid diet. NPO p MN.    LOS: 0 days   Lear Ng  07/05/2018, 12:04 PM  Questions please call [phone]

## 2018-07-05 NOTE — Progress Notes (Signed)
ANTICOAGULATION CONSULT NOTE - Follow Up Consult  Pharmacy Consult for Warfarin/Heparin Indication: atrial fibrillation  Allergies  Allergen Reactions  . Aromasin [Exemestane]     Hair loss   . Femara [Letrozole]     unknown  . Gemfibrozil Other (See Comments)    Other reaction(s): Other (See Comments) Unknown Severe fatigue  Other reaction(s): Other (See Comments) Unknown Severe fatigue Unknown  . Hydrocodone-Acetaminophen Nausea And Vomiting    Patient reports terrible nausea; plain tylenol is ok with patient  . Letrozole Other (See Comments)    Unknown reactions   . Metoprolol Diarrhea    Fatigue   . Other Other (See Comments)    Other reaction(s): Other (See Comments) Numerous cancer drugs - unknown reaction Other reaction(s): Other (See Comments) Numerous cancer drugs - unknown reaction Numerous cancer drugs - unknown reaction  . Tamoxifen     Bone and body aches   . Tetracycline Other (See Comments)    Other reaction(s): Other (See Comments) Unknown reaction Other reaction(s): Other (See Comments) Unknown reaction Unknown reaction  . Tetracyclines & Related Other (See Comments)    Vaginal infections    . Toprol Xl [Metoprolol Tartrate]     Hair loss  . Verapamil Other (See Comments)    Other reaction(s): Other (See Comments) Unknown reaction Other reaction(s): Other (See Comments) Unknown reaction Unknown reaction  . Vicodin [Hydrocodone-Acetaminophen] Nausea And Vomiting    Patient reports terrible nausea; plain tylenol is ok with patient    Patient Measurements: Weight: 250 lb 4.8 oz (113.5 kg) Heparin Dosing Weight: 84 kg  Vital Signs: Temp: 98.4 F (36.9 C) (11/11 0637) Temp Source: Oral (11/11 0637) BP: 131/69 (11/11 0637) Pulse Rate: 117 (11/11 0637)  Labs: Recent Labs    07/02/18 1030 07/02/18 1705 07/02/18 2259 07/03/18 0611 07/04/18 0457 07/04/18 1842 07/05/18 0515  HGB 10.2*  --   --   --  9.1*  --  9.2*  HCT 36.4  --    --   --  31.5*  --  31.9*  PLT 174  --   --   --  148*  --  150  LABPROT 32.2*  --   --  24.5* 22.4*  --  22.7*  INR 3.19  --   --  2.24 1.99  --  2.04  HEPARINUNFRC  --   --   --   --   --  0.11* 0.28*  CREATININE 1.15*  --   --  1.22* 1.09*  --  1.21*  TROPONINI  --  <0.03 <0.03 <0.03  --   --   --     CrCl cannot be calculated (Unknown ideal weight.).   Medications:  Scheduled:  . diltiazem  60 mg Oral Q6H  . insulin aspart  0-15 Units Subcutaneous TID WC  . metoprolol tartrate  25 mg Oral BID  . sodium chloride flush  3 mL Intravenous Q12H  . Warfarin - Pharmacist Dosing Inpatient   Does not apply q1800    Assessment: 72 YO female with persistent afib on Coumadin PTA. PTA warfarin: 7.5 mg on Thursdays, 5 mg all other days. Admit INR 3.19 but fell to 1.99 this weekend. Pharmacy has been consulted continue warfarin and bridge with heparin for TEE/DCCV. CBC stable.  Heparin drip 1500 uts/hr HL 0.28 slightly < goal but INR up to 2.05 will restart home dose warfarin and discuss with MD if need to continue heparin until DCCV or stop now with INR > 2  Goal  of Therapy:  Heparin level 0.3-0.7 units/ml Monitor platelets by anticoagulation protocol: Yes   Plan:  -Increase heparin to 1550 units/h - warfarin 7.5mg  on Thur and 5mg  all other days  Bonnita Nasuti Pharm.D. CPP, BCPS Clinical Pharmacist [phone] 07/05/2018 8:29 AM

## 2018-07-05 NOTE — Progress Notes (Signed)
Progress Note  Patient Name: Jenna Mcdonald Date of Encounter: 07/05/2018  Primary Cardiologist: Fransico Him, MD   Subjective   No chest pain no SOB- just wants to go home , discussed Heme + stools, she has not seen any blood.  Hs hx of Iron def anemia - thalassemia minor.    Inpatient Medications    Scheduled Meds: . diltiazem  60 mg Oral Q6H  . insulin aspart  0-15 Units Subcutaneous TID WC  . metoprolol tartrate  25 mg Oral BID  . sodium chloride flush  3 mL Intravenous Q12H  . Warfarin - Pharmacist Dosing Inpatient   Does not apply q1800   Continuous Infusions: . sodium chloride    . heparin 1,500 Units/hr (07/05/18 0551)   PRN Meds: acetaminophen, guaiFENesin-dextromethorphan, ondansetron (ZOFRAN) IV, phenol, sodium chloride flush   Vital Signs    Vitals:   07/04/18 1130 07/04/18 1720 07/04/18 2055 07/05/18 0637  BP: 135/72 138/66 134/72 131/69  Pulse: 92 (!) 102  (!) 117  Resp:   18 18  Temp: 98.7 F (37.1 C) 98.6 F (37 C)  98.4 F (36.9 C)  TempSrc: Oral Oral  Oral  SpO2: 93% 94% 94% 93%  Weight:    113.5 kg    Intake/Output Summary (Last 24 hours) at 07/05/2018 0825 Last data filed at 07/05/2018 0300 Gross per 24 hour  Intake 555.68 ml  Output 300 ml  Net 255.68 ml   Filed Weights   07/03/18 2100 07/04/18 0645 07/05/18 0637  Weight: 114.2 kg 113.7 kg 113.5 kg    Telemetry    A fib average 105 or so though at times to 125.   - Personally Reviewed  ECG    A fib with RVR and ST depression inf lat leads - Personally Reviewed  Physical Exam   GEN: No acute distress.   Neck: No JVD Cardiac: irreg irreg no murmurs, rubs, or gallops.  Respiratory: Clear to auscultation bilaterally. GI: Soft, nontender, non-distended  MS: + edema; No deformity. Neuro:  Nonfocal  Psych: Normal affect   Labs    Chemistry Recent Labs  Lab 07/02/18 1030 07/03/18 0611 07/04/18 0457 07/05/18 0515  NA 141 140 138 140  K 4.6 4.5 3.9 4.5  CL 110  113* 111 110  CO2 22 20* 21* 22  GLUCOSE 133* 138* 153* 139*  BUN 15 14 14 16   CREATININE 1.15* 1.22* 1.09* 1.21*  CALCIUM 9.4 8.3* 8.1* 8.2*  PROT 7.0  --   --   --   ALBUMIN 3.9  --   --   --   AST 45*  --   --   --   ALT 45*  --   --   --   ALKPHOS 80  --   --   --   BILITOT 0.5  --   --   --   GFRNONAA 46* 43* 49* 44*  GFRAA 54* 50* 57* 51*  ANIONGAP 9 7 6 8      Hematology Recent Labs  Lab 07/02/18 1030 07/04/18 0457 07/05/18 0515  WBC 7.0 7.2 7.5  RBC 5.81* 5.14* 5.12*  HGB 10.2* 9.1* 9.2*  HCT 36.4 31.5* 31.9*  MCV 62.7* 61.3* 62.3*  MCH 17.6* 17.7* 18.0*  MCHC 28.0* 28.9* 28.8*  RDW 20.3* 19.5* 19.8*  PLT 174 148* 150    Cardiac Enzymes Recent Labs  Lab 07/02/18 1705 07/02/18 2259 07/03/18 0611  TROPONINI <0.03 <0.03 <0.03    Recent Labs  Lab 07/02/18  1049  TROPIPOC 0.01     BNPNo results for input(s): BNP, PROBNP in the last 168 hours.   DDimer No results for input(s): DDIMER in the last 168 hours.   Radiology    No results found.  Cardiac Studies   Echo 08/2016  Patient Profile     72 y.o. female w/ PMH of PAF (on Coumadin), carotid artery stenosis, mild AD, HTN, HLD, and Type 2 DM and OSA on BiPAP who presented to [facility] ED on 07/02/2018 for worsening URI symptoms and chest pain, found to be in atrial fibrillation with RVR.    Assessment & Plan    Atrial fib RVR, --recent viral URI, neg MI -- now on po dilt. And BB.  Her INR was 1.99 yesterday , today 2.04  Plan for TEE DCCV today but with heme + stools will hold for now.  --she is NPO will keep until GI sees --HR with imporved control  --will stop heparin with therapeutic INR and anemia  Anemia hgb is 9.2 down from 10.2,  Stools heme +, will have GI see --thalassemia minor hx. --GI consulted Dr. Watt Climes.  HTN BP controlled   OSA with CPAP, no issues  DM-type 2 with CBGs 121 to 193 on SSI  Aortic stenosis, echo ordered.    For questions or updates, please contact [place] Please consult [email or web address] for contact info under        Signed, Cecilie Kicks, NP  07/05/2018, 8:25 AM

## 2018-07-05 NOTE — Plan of Care (Signed)
  Problem: Nutrition: Goal: Adequate nutrition will be maintained Outcome: Completed/Met   Problem: Pain Managment: Goal: General experience of comfort will improve Outcome: Completed/Met

## 2018-07-06 ENCOUNTER — Encounter (HOSPITAL_COMMUNITY)
Admission: EM | Disposition: A | Discharge status: Home / Self Care | Payer: Self-pay | Source: Home / Self Care | Attending: Cardiovascular Disease

## 2018-07-06 ENCOUNTER — Observation Stay (HOSPITAL_COMMUNITY): Payer: Medicare Other | Admitting: Anesthesiology

## 2018-07-06 ENCOUNTER — Encounter (HOSPITAL_COMMUNITY): Payer: Self-pay | Admitting: Anesthesiology

## 2018-07-06 DIAGNOSIS — I4819 Other persistent atrial fibrillation: Secondary | ICD-10-CM | POA: Diagnosis present

## 2018-07-06 DIAGNOSIS — I10 Essential (primary) hypertension: Secondary | ICD-10-CM | POA: Diagnosis present

## 2018-07-06 DIAGNOSIS — D649 Anemia, unspecified: Secondary | ICD-10-CM | POA: Diagnosis not present

## 2018-07-06 DIAGNOSIS — E8881 Metabolic syndrome: Secondary | ICD-10-CM | POA: Diagnosis present

## 2018-07-06 DIAGNOSIS — G4700 Insomnia, unspecified: Secondary | ICD-10-CM | POA: Diagnosis present

## 2018-07-06 DIAGNOSIS — Z6841 Body Mass Index (BMI) 40.0 and over, adult: Secondary | ICD-10-CM | POA: Diagnosis not present

## 2018-07-06 DIAGNOSIS — E785 Hyperlipidemia, unspecified: Secondary | ICD-10-CM | POA: Diagnosis present

## 2018-07-06 DIAGNOSIS — I350 Nonrheumatic aortic (valve) stenosis: Secondary | ICD-10-CM | POA: Diagnosis present

## 2018-07-06 DIAGNOSIS — D563 Thalassemia minor: Secondary | ICD-10-CM | POA: Diagnosis present

## 2018-07-06 DIAGNOSIS — K589 Irritable bowel syndrome without diarrhea: Secondary | ICD-10-CM | POA: Diagnosis present

## 2018-07-06 DIAGNOSIS — K5520 Angiodysplasia of colon without hemorrhage: Secondary | ICD-10-CM | POA: Diagnosis present

## 2018-07-06 DIAGNOSIS — G4733 Obstructive sleep apnea (adult) (pediatric): Secondary | ICD-10-CM | POA: Diagnosis present

## 2018-07-06 DIAGNOSIS — E119 Type 2 diabetes mellitus without complications: Secondary | ICD-10-CM | POA: Diagnosis present

## 2018-07-06 DIAGNOSIS — Q211 Atrial septal defect: Secondary | ICD-10-CM | POA: Diagnosis not present

## 2018-07-06 DIAGNOSIS — I340 Nonrheumatic mitral (valve) insufficiency: Secondary | ICD-10-CM | POA: Diagnosis not present

## 2018-07-06 DIAGNOSIS — K449 Diaphragmatic hernia without obstruction or gangrene: Secondary | ICD-10-CM | POA: Diagnosis present

## 2018-07-06 DIAGNOSIS — E8770 Fluid overload, unspecified: Secondary | ICD-10-CM | POA: Diagnosis not present

## 2018-07-06 DIAGNOSIS — I4891 Unspecified atrial fibrillation: Secondary | ICD-10-CM | POA: Diagnosis present

## 2018-07-06 DIAGNOSIS — R0989 Other specified symptoms and signs involving the circulatory and respiratory systems: Secondary | ICD-10-CM | POA: Diagnosis present

## 2018-07-06 DIAGNOSIS — K644 Residual hemorrhoidal skin tags: Secondary | ICD-10-CM | POA: Diagnosis present

## 2018-07-06 DIAGNOSIS — K5730 Diverticulosis of large intestine without perforation or abscess without bleeding: Secondary | ICD-10-CM | POA: Diagnosis present

## 2018-07-06 DIAGNOSIS — R195 Other fecal abnormalities: Secondary | ICD-10-CM

## 2018-07-06 DIAGNOSIS — K648 Other hemorrhoids: Secondary | ICD-10-CM | POA: Diagnosis present

## 2018-07-06 DIAGNOSIS — K219 Gastro-esophageal reflux disease without esophagitis: Secondary | ICD-10-CM | POA: Diagnosis present

## 2018-07-06 DIAGNOSIS — E559 Vitamin D deficiency, unspecified: Secondary | ICD-10-CM | POA: Diagnosis present

## 2018-07-06 DIAGNOSIS — K269 Duodenal ulcer, unspecified as acute or chronic, without hemorrhage or perforation: Secondary | ICD-10-CM | POA: Diagnosis present

## 2018-07-06 DIAGNOSIS — D500 Iron deficiency anemia secondary to blood loss (chronic): Secondary | ICD-10-CM | POA: Diagnosis present

## 2018-07-06 HISTORY — PX: BIOPSY: SHX5522

## 2018-07-06 HISTORY — PX: ESOPHAGOGASTRODUODENOSCOPY (EGD) WITH PROPOFOL: SHX5813

## 2018-07-06 HISTORY — PX: HOT HEMOSTASIS: SHX5433

## 2018-07-06 HISTORY — PX: COLONOSCOPY WITH PROPOFOL: SHX5780

## 2018-07-06 LAB — CBC
HCT: 32.2 % — ABNORMAL LOW (ref 36.0–46.0)
Hemoglobin: 9 g/dL — ABNORMAL LOW (ref 12.0–15.0)
MCH: 17.2 pg — ABNORMAL LOW (ref 26.0–34.0)
MCHC: 28 g/dL — ABNORMAL LOW (ref 30.0–36.0)
MCV: 61.7 fL — ABNORMAL LOW (ref 80.0–100.0)
Platelets: 171 10*3/uL (ref 150–400)
RBC: 5.22 MIL/uL — ABNORMAL HIGH (ref 3.87–5.11)
RDW: 19.5 % — ABNORMAL HIGH (ref 11.5–15.5)
WBC: 6.4 10*3/uL (ref 4.0–10.5)
nRBC: 0 % (ref 0.0–0.2)

## 2018-07-06 LAB — GLUCOSE, CAPILLARY
Glucose-Capillary: 140 mg/dL — ABNORMAL HIGH (ref 70–99)
Glucose-Capillary: 137 mg/dL — ABNORMAL HIGH (ref 70–99)
Glucose-Capillary: 152 mg/dL — ABNORMAL HIGH (ref 70–99)
Glucose-Capillary: 154 mg/dL — ABNORMAL HIGH (ref 70–99)

## 2018-07-06 LAB — PROTIME-INR
INR: 2.04
Prothrombin Time: 22.8 seconds — ABNORMAL HIGH (ref 11.4–15.2)

## 2018-07-06 SURGERY — ESOPHAGOGASTRODUODENOSCOPY (EGD) WITH PROPOFOL
Anesthesia: General

## 2018-07-06 MED ORDER — SODIUM CHLORIDE 0.9 % IV SOLN: INTRAVENOUS | Status: DC

## 2018-07-06 MED ORDER — SODIUM CHLORIDE 0.9 % IV SOLN
INTRAVENOUS | Status: DC | PRN
  Administered 2018-07-06: 50 ug/min via INTRAVENOUS

## 2018-07-06 MED ORDER — PROPOFOL 10 MG/ML IV BOLUS
INTRAVENOUS | Status: DC | PRN
  Administered 2018-07-06: 100 mg via INTRAVENOUS

## 2018-07-06 MED ORDER — SODIUM CHLORIDE 0.9% FLUSH: 3.0000 mL | INTRAVENOUS | Status: DC | PRN

## 2018-07-06 MED ORDER — BUTAMBEN-TETRACAINE-BENZOCAINE 2-2-14 % EX AERO
INHALATION_SPRAY | CUTANEOUS | Status: DC | PRN
  Administered 2018-07-06: 1 via TOPICAL

## 2018-07-06 MED ORDER — SODIUM CHLORIDE 0.9 % IV SOLN: 250.0000 mL | INTRAVENOUS | Status: DC

## 2018-07-06 MED ORDER — MIDAZOLAM HCL 5 MG/5ML IJ SOLN
INTRAMUSCULAR | Status: DC | PRN
  Administered 2018-07-06 (×2): 1 mg via INTRAVENOUS

## 2018-07-06 MED ORDER — ONDANSETRON HCL 4 MG/2ML IJ SOLN
INTRAMUSCULAR | Status: DC | PRN
  Administered 2018-07-06: 4 mg via INTRAVENOUS

## 2018-07-06 MED ORDER — ALBUTEROL SULFATE HFA 108 (90 BASE) MCG/ACT IN AERS
INHALATION_SPRAY | RESPIRATORY_TRACT | Status: DC | PRN
  Administered 2018-07-06: 2 via RESPIRATORY_TRACT

## 2018-07-06 MED ORDER — SUCCINYLCHOLINE CHLORIDE 200 MG/10ML IV SOSY
PREFILLED_SYRINGE | INTRAVENOUS | Status: DC | PRN
  Administered 2018-07-06: 200 mg via INTRAVENOUS

## 2018-07-06 MED ORDER — SODIUM CHLORIDE 0.9% FLUSH
3.0000 mL | Freq: Two times a day (BID) | INTRAVENOUS | Status: DC
  Administered 2018-07-06 – 2018-07-07 (×2): 3 mL via INTRAVENOUS

## 2018-07-06 MED ORDER — PANTOPRAZOLE SODIUM 40 MG PO TBEC
40.0000 mg | DELAYED_RELEASE_TABLET | Freq: Every day | ORAL | Status: DC
  Administered 2018-07-06 – 2018-07-07 (×2): 40 mg via ORAL
  Filled 2018-07-06 (×2): qty 1

## 2018-07-06 MED ORDER — PHENYLEPHRINE 40 MCG/ML (10ML) SYRINGE FOR IV PUSH (FOR BLOOD PRESSURE SUPPORT)
PREFILLED_SYRINGE | INTRAVENOUS | Status: DC | PRN
  Administered 2018-07-06: 200 ug via INTRAVENOUS
  Administered 2018-07-06: 160 ug via INTRAVENOUS
  Administered 2018-07-06: 40 ug via INTRAVENOUS

## 2018-07-06 MED ORDER — PROPOFOL 500 MG/50ML IV EMUL
INTRAVENOUS | Status: DC | PRN
  Administered 2018-07-06: 100 ug/kg/min via INTRAVENOUS

## 2018-07-06 MED ORDER — WARFARIN SODIUM 7.5 MG PO TABS
7.5000 mg | ORAL_TABLET | Freq: Once | ORAL | Status: AC
  Administered 2018-07-06: 7.5 mg via ORAL
  Filled 2018-07-06: qty 1

## 2018-07-06 MED ORDER — DIPHENHYDRAMINE HCL 25 MG PO CAPS
25.0000 mg | ORAL_CAPSULE | Freq: Every evening | ORAL | Status: DC | PRN
  Administered 2018-07-06: 25 mg via ORAL
  Filled 2018-07-06: qty 1

## 2018-07-06 MED ORDER — LACTATED RINGERS IV SOLN
INTRAVENOUS | Status: DC | PRN
  Administered 2018-07-06: 08:00:00 via INTRAVENOUS

## 2018-07-06 SURGICAL SUPPLY — 24 items

## 2018-07-06 NOTE — Op Note (Signed)
[facility] Patient Name: Jenna Mcdonald Procedure Date : 07/06/2018 MRN: [id number] Attending MD: Clarene Essex , MD Date of Birth: [birth year] CSN: [id number] Age: 72 Admit Type: Inpatient Procedure:                Upper GI endoscopy Indications:              Iron deficiency anemia secondary to chronic blood                            loss Providers:                Clarene Essex, MD, Cleda Daub, RN, William Dalton,                            Technician Referring MD:              Medicines:                General Anesthesia Complications:            No immediate complications. Estimated Blood Loss:     Estimated blood loss: none. Procedure:                Pre-Anesthesia Assessment:                           - Prior to the procedure, a History and Physical                            was performed, and patient medications and                            allergies were reviewed. The patient's tolerance of                            previous anesthesia was also reviewed. The risks                            and benefits of the procedure and the sedation                            options and risks were discussed with the patient.                            All questions were answered, and informed consent                            was obtained. Prior Anticoagulants: The patient has                            taken Coumadin (warfarin), last dose was 1 day                            prior to procedure. ASA Grade Assessment: II - A  patient with mild systemic disease. After reviewing                            the risks and benefits, the patient was deemed in                            satisfactory condition to undergo the procedure.                           After obtaining informed consent, the endoscope was                            passed under direct vision. Throughout the                            procedure, the patient's blood pressure,  pulse, and                            oxygen saturations were monitored continuously. The                            GIF-H190 ([id number]) Olympus Adult EGD was introduced                            through the mouth, and advanced to the second part                            of duodenum. The upper GI endoscopy was                            accomplished without difficulty. The patient                            tolerated the procedure well. Scope In: Scope Out: Findings:      A tiny hiatal hernia was present.      The entire examined stomach was normal. Biopsies were taken with a cold       forceps for histology of antrum and fundus to rule out H. pylori.      Multiple non-bleeding superficial duodenal ulcers with no stigmata of       bleeding were found in the duodenal bulb, in the first portion of the       duodenum and in the second portion of the duodenum.      The exam was otherwise without abnormality. Impression:               - Tiny hiatal hernia.                           - Normal stomach. Biopsied.                           - Multiple non-bleeding superficial duodenal ulcers                            with no stigmata of bleeding.                           -  The examination was otherwise normal. Recommendation:           - Soft diet today.                           - Continue present medications. Daily Protonix                           - Await pathology results.                           - Return to GI clinic PRN.                           - Telephone GI clinic for pathology results in 1                            week.                           - Telephone GI clinic if symptomatic PRN.                           - Perform a colonoscopy today. Okay with me to                            proceed with cardioversion and anticoagulation and                            currently INR is therapeutic Procedure Code(s):        --- Professional ---                           [phone],  Esophagogastroduodenoscopy, flexible,                            transoral; with biopsy, single or multiple Diagnosis Code(s):        --- Professional ---                           K44.9, Diaphragmatic hernia without obstruction or                            gangrene                           K26.9, Duodenal ulcer, unspecified as acute or                            chronic, without hemorrhage or perforation                           D50.0, Iron deficiency anemia secondary to blood                            loss (chronic) CPT copyright 2018 American Medical Association. All rights reserved. The codes documented in this report are preliminary  and upon coder review may  be revised to meet current compliance requirements. Clarene Essex, MD 07/06/2018 9:15:58 AM This report has been signed electronically. Number of Addenda: 0

## 2018-07-06 NOTE — Progress Notes (Signed)
ANTICOAGULATION CONSULT NOTE - Follow Up Consult  Pharmacy Consult for Warfarin/Heparin Indication: atrial fibrillation  Allergies  Allergen Reactions  . Aromasin [Exemestane]     Hair loss   . Femara [Letrozole]     unknown  . Gemfibrozil Other (See Comments)    Other reaction(s): Other (See Comments) Unknown Severe fatigue  Other reaction(s): Other (See Comments) Unknown Severe fatigue Unknown  . Hydrocodone-Acetaminophen Nausea And Vomiting    Patient reports terrible nausea; plain tylenol is ok with patient  . Letrozole Other (See Comments)    Unknown reactions   . Metoprolol Diarrhea    Fatigue   . Other Other (See Comments)    Other reaction(s): Other (See Comments) Numerous cancer drugs - unknown reaction Other reaction(s): Other (See Comments) Numerous cancer drugs - unknown reaction Numerous cancer drugs - unknown reaction  . Tamoxifen     Bone and body aches   . Tetracycline Other (See Comments)    Other reaction(s): Other (See Comments) Unknown reaction Other reaction(s): Other (See Comments) Unknown reaction Unknown reaction  . Tetracyclines & Related Other (See Comments)    Vaginal infections    . Toprol Xl [Metoprolol Tartrate]     Hair loss  . Verapamil Other (See Comments)    Other reaction(s): Other (See Comments) Unknown reaction Other reaction(s): Other (See Comments) Unknown reaction Unknown reaction  . Vicodin [Hydrocodone-Acetaminophen] Nausea And Vomiting    Patient reports terrible nausea; plain tylenol is ok with patient    Patient Measurements: Weight: 251 lb 12.3 oz (114.2 kg) Heparin Dosing Weight: 84 kg  Vital Signs: Temp: 98.6 F (37 C) (11/12 1011) Temp Source: Oral (11/12 1011) BP: 150/74 (11/12 1128) Pulse Rate: 102 (11/12 1021)  Labs: Recent Labs    07/04/18 0457 07/04/18 1842 07/05/18 0515 07/06/18 0401  HGB 9.1*  --  9.2* 9.0*  HCT 31.5*  --  31.9* 32.2*  PLT 148*  --  150 171  LABPROT 22.4*  --  22.7*  22.8*  INR 1.99  --  2.04 2.04  HEPARINUNFRC  --  0.11* 0.28*  --   CREATININE 1.09*  --  1.21*  --     CrCl cannot be calculated (Unknown ideal weight.).   Medications:  Scheduled:  . diltiazem  90 mg Oral Q6H  . insulin aspart  0-15 Units Subcutaneous TID WC  . metoprolol tartrate  25 mg Oral BID  . pantoprazole  40 mg Oral Daily  . sodium chloride flush  3 mL Intravenous Q12H  . sodium chloride flush  3 mL Intravenous Q12H  . Warfarin - Pharmacist Dosing Inpatient   Does not apply q1800    Assessment: 72 YO female with persistent afib on warfarin 5mg  daily exc 7.5mg  on Thurs PTA for AFib. INR on admission = 3.1. Now INR stable at 2.04. Dose held on 11/11 due to NPO and for EGD / colonoscopy on 11/12. Now to continue with plans for DCCV tomorrow. Hgb low but stable at 9, plts wnl.  Goal of Therapy:  Heparin level 0.3-0.7 units/ml Monitor platelets by anticoagulation protocol: Yes   Plan:  Give Coumadin 7.5mg  PO x 1 tonight Monitor daily INR, CBC, s/s of bleed Plan for DCCV on 11/13  Elenor Quinones, PharmD, BCPS Clinical Pharmacist Phone number [phone] 07/06/2018 1:36 PM

## 2018-07-06 NOTE — Transfer of Care (Signed)
Immediate Anesthesia Transfer of Care Note  Patient: Jenna Mcdonald  Procedure(s) Performed: ESOPHAGOGASTRODUODENOSCOPY (EGD) WITH PROPOFOL (N/A ) COLONOSCOPY WITH PROPOFOL (N/A ) BIOPSY HOT HEMOSTASIS (ARGON PLASMA COAGULATION/BICAP) (N/A )  Patient Location: Endoscopy Unit  Anesthesia Type:General  Level of Consciousness: awake, alert  and oriented  Airway & Oxygen Therapy: Patient Spontanous Breathing and Patient connected to nasal cannula oxygen  Post-op Assessment: Report given to RN and Post -op Vital signs reviewed and stable  Post vital signs: Reviewed and stable  Last Vitals:  Vitals Value Taken Time  BP 146/85 07/06/2018  9:16 AM  Temp 36.8 C 07/06/2018  9:16 AM  Pulse 98 07/06/2018  9:18 AM  Resp 25 07/06/2018  9:18 AM  SpO2 90 % 07/06/2018  9:18 AM  Vitals shown include unvalidated device data.  Last Pain:  Vitals:   07/06/18 0916  TempSrc: Oral  PainSc:       Patients Stated Pain Goal: 0 (62/70/35 0093)  Complications: No apparent anesthesia complications

## 2018-07-06 NOTE — Progress Notes (Addendum)
Jenna Mcdonald 8:10 AM  Subjective: Patient without any GI complaints and case discussed with her son colonoscopy without problem years ago and we were able to find the report from his 2011 colonoscopy which was reviewed  Objective: Vital signs stable afebrile no acute distress exam please see previous assessment evaluation hemoglobin stable INR 2  Assessment: Anemia guaiac positive  Plan: Okay for colonoscopy and endoscopy with anesthesia assistance  [facility] E  Pager [phone] After 5PM or if no answer call [phone]

## 2018-07-06 NOTE — Anesthesia Postprocedure Evaluation (Signed)
Anesthesia Post Note  Patient: ANTINETTE KEOUGH  Procedure(s) Performed: ESOPHAGOGASTRODUODENOSCOPY (EGD) WITH PROPOFOL (N/A ) COLONOSCOPY WITH PROPOFOL (N/A ) BIOPSY HOT HEMOSTASIS (ARGON PLASMA COAGULATION/BICAP) (N/A )     Patient location during evaluation: Endoscopy Anesthesia Type: General Level of consciousness: awake and alert Pain management: pain level controlled Vital Signs Assessment: post-procedure vital signs reviewed and stable Respiratory status: spontaneous breathing, nonlabored ventilation, respiratory function stable and patient connected to nasal cannula oxygen Cardiovascular status: blood pressure returned to baseline and stable Postop Assessment: no apparent nausea or vomiting Anesthetic complications: no    Last Vitals:  Vitals:   07/06/18 1021 07/06/18 1128  BP: 132/81 (!) 150/74  Pulse: (!) 102   Resp:    Temp:    SpO2:      Last Pain:  Vitals:   07/06/18 1021  TempSrc:   PainSc: 0-No pain                 Effie Berkshire

## 2018-07-06 NOTE — Anesthesia Preprocedure Evaluation (Addendum)
Anesthesia Evaluation  Patient identified by MRN, date of birth, ID band Patient awake    Reviewed: Allergy & Precautions, NPO status , Patient's Chart, lab work & pertinent test results  History of Anesthesia Complications (+) PONV  Airway Mallampati: IV  TM Distance: <3 FB Neck ROM: Full    Dental  (+) Teeth Intact, Dental Advisory Given   Pulmonary sleep apnea , former smoker,    breath sounds clear to auscultation       Cardiovascular hypertension, + dysrhythmias Atrial Fibrillation  Rhythm:Regular Rate:Normal     Neuro/Psych negative neurological ROS  negative psych ROS   GI/Hepatic Neg liver ROS, GERD  ,  Endo/Other  diabetes, Type 2, Oral Hypoglycemic Agents  Renal/GU      Musculoskeletal   Abdominal (+) + obese,   Peds  Hematology   Anesthesia Other Findings   Reproductive/Obstetrics                            Anesthesia Physical Anesthesia Plan  ASA: III  Anesthesia Plan: MAC   Post-op Pain Management:    Induction: Intravenous  PONV Risk Score and Plan: Propofol infusion and Ondansetron  Airway Management Planned: Natural Airway and Nasal Cannula  Additional Equipment: None  Intra-op Plan:   Post-operative Plan:   Informed Consent: I have reviewed the patients History and Physical, chart, labs and discussed the procedure including the risks, benefits and alternatives for the proposed anesthesia with the patient or authorized representative who has indicated his/her understanding and acceptance.   Dental advisory given  Plan Discussed with: CRNA  Anesthesia Plan Comments:        Anesthesia Quick Evaluation

## 2018-07-06 NOTE — Progress Notes (Signed)
Progress Note  Patient Name: Jenna Mcdonald Date of Encounter: 07/06/2018  Primary Cardiologist: Jenna Him, MD   Subjective   No chest pain, occ SOB   Inpatient Medications    Scheduled Meds: . diltiazem  90 mg Oral Q6H  . insulin aspart  0-15 Units Subcutaneous TID WC  . metoprolol tartrate  25 mg Oral BID  . pantoprazole  40 mg Oral Daily  . sodium chloride flush  3 mL Intravenous Q12H  . Warfarin - Pharmacist Dosing Inpatient   Does not apply q1800   Continuous Infusions: . sodium chloride    . sodium chloride     PRN Meds: acetaminophen, guaiFENesin-dextromethorphan, ondansetron (ZOFRAN) IV, phenol, sodium chloride flush   Vital Signs    Vitals:   07/06/18 0935 07/06/18 0945 07/06/18 1011 07/06/18 1021  BP: (!) 147/65 (!) 159/63 132/81 132/81  Pulse: 61 (!) 50 88 (!) 102  Resp: 19 19 15    Temp:   98.6 F (37 C)   TempSrc:   Oral   SpO2: 93% 93%    Weight:        Intake/Output Summary (Last 24 hours) at 07/06/2018 1112 Last data filed at 07/06/2018 0902 Gross per 24 hour  Intake 870 ml  Output 0 ml  Net 870 ml   Filed Weights   07/04/18 0645 07/05/18 0637 07/06/18 0627  Weight: 113.7 kg 113.5 kg 114.2 kg    Telemetry    A fib mostly rate controlled with occ tachycardia and occ pauses - Personally Reviewed  ECG    No new - Personally Reviewed  Physical Exam   GEN: No acute distress.   Neck: No JVD Cardiac: irreg irreg, no murmurs, rubs, or gallops.  Respiratory: Clear to auscultation bilaterally. GI: Soft, nontender, non-distended  MS: No edema; No deformity. Neuro:  Nonfocal  Psych: Normal affect   Labs    Chemistry Recent Labs  Lab 07/02/18 1030 07/03/18 0611 07/04/18 0457 07/05/18 0515  NA 141 140 138 140  K 4.6 4.5 3.9 4.5  CL 110 113* 111 110  CO2 22 20* 21* 22  GLUCOSE 133* 138* 153* 139*  BUN 15 14 14 16   CREATININE 1.15* 1.22* 1.09* 1.21*  CALCIUM 9.4 8.3* 8.1* 8.2*  PROT 7.0  --   --   --   ALBUMIN 3.9   --   --   --   AST 45*  --   --   --   ALT 45*  --   --   --   ALKPHOS 80  --   --   --   BILITOT 0.5  --   --   --   GFRNONAA 46* 43* 49* 44*  GFRAA 54* 50* 57* 51*  ANIONGAP 9 7 6 8      Hematology Recent Labs  Lab 07/04/18 0457 07/05/18 0515 07/05/18 1026 07/06/18 0401  WBC 7.2 7.5  --  6.4  RBC 5.14* 5.12* 5.38* 5.22*  HGB 9.1* 9.2*  --  9.0*  HCT 31.5* 31.9*  --  32.2*  MCV 61.3* 62.3*  --  61.7*  MCH 17.7* 18.0*  --  17.2*  MCHC 28.9* 28.8*  --  28.0*  RDW 19.5* 19.8*  --  19.5*  PLT 148* 150  --  171    Cardiac Enzymes Recent Labs  Lab 07/02/18 1705 07/02/18 2259 07/03/18 0611  TROPONINI <0.03 <0.03 <0.03    Recent Labs  Lab 07/02/18 1049  TROPIPOC 0.01  BNPNo results for input(s): BNP, PROBNP in the last 168 hours.   DDimer No results for input(s): DDIMER in the last 168 hours.   Radiology    No results found.  Cardiac Studies   Echo 08/2016 Study Conclusions  - Left ventricle: The cavity size was normal. Wall thickness was   increased in a pattern of moderate LVH. Systolic function was   normal. The estimated ejection fraction was in the range of 55%   to 60%. Wall motion was normal; there were no regional wall   motion abnormalities. Left ventricular diastolic function   parameters were normal. - Aortic valve: There was very mild stenosis. - Mitral valve: nodular calcification of anterior mitral leaflet.   Calcified annulus. Mildly thickened leaflets . - Left atrium: The atrium was moderately dilated. - Atrial septum: No defect or patent foramen ovale was identified.   Patient Profile     72 y.o. female w/ PMH of PAF (on Coumadin), carotid artery stenosis, mild AD, HTN, HLD, and Type 2 DM and OSA on BiPAP who presented to [facility] ED on 07/02/2018 for worsening URI symptoms and chest pain, found to be in atrial fibrillation with RVR.  Assessment & Plan    Atrial fib RVR, --recent viral URI, neg MI -- now on po dilt. And BB.  Her  INR 2.04 today-- --HR with imporved control mostly under 105 and occ pauses. Plan for TEE DCCV tomorrow    [facility] has been requested to perform a transesophageal echocardiogram on [name] for DCCV.  After careful review of history and examination, the risks and benefits of transesophageal echocardiogram have been explained including risks of esophageal damage, perforation (1:10,000 risk), bleeding, pharyngeal hematoma as well as other potential complications associated with conscious sedation including aspiration, arrhythmia, respiratory failure and death. Alternatives to treatment were discussed, questions were answered. Patient is willing to proceed.   Anemia hgb is now 9.0 ,  9.2 down from 10.2,  Stools heme +, --thalassemia minor hx. --GI consulted Dr. Watt Climes. EGD and colonoscopy  --+ hemorrhoids internal and external, polyps removed  --non bleeding ulcers.    HTN BP controlled to elevated today just rec'd lopressor  OSA with CPAP, no issues  DM-type 2 with CBGs 121 to 193 on SSI  Aortic stenosis, for TEE in am  For questions or updates, please contact [place] Please consult [email or web address] for contact info under        Signed, Cecilie Kicks, NP  07/06/2018, 11:12 AM

## 2018-07-06 NOTE — Anesthesia Procedure Notes (Signed)
Procedure Name: Intubation Date/Time: 07/06/2018 8:20 AM Performed by: Wilburn Cornelia, CRNA Pre-anesthesia Checklist: Patient identified, Emergency Drugs available, Suction available, Patient being monitored and Timeout performed Patient Re-evaluated:Patient Re-evaluated prior to induction Oxygen Delivery Method: Circle system utilized Preoxygenation: Pre-oxygenation with 100% oxygen Induction Type: IV induction Laryngoscope Size: Mac and 3 Grade View: Grade I Tube type: Oral Tube size: 7.5 mm Number of attempts: 1 Airway Equipment and Method: Stylet Placement Confirmation: ETT inserted through vocal cords under direct vision,  positive ETCO2,  CO2 detector and breath sounds checked- equal and bilateral Secured at: 22 cm Tube secured with: Tape Dental Injury: Teeth and Oropharynx as per pre-operative assessment

## 2018-07-06 NOTE — Op Note (Signed)
[facility] Patient Name: Jenna Mcdonald Procedure Date : 07/06/2018 MRN: [id number] Attending MD: Clarene Essex , MD Date of Birth: 1945/10/22 CSN: [id number] Age: 72 Admit Type: Inpatient Procedure:                Colonoscopy Indications:              Last colonoscopy: January 2011, Heme positive                            stool, Iron deficiency anemia secondary to chronic                            blood loss Providers:                Clarene Essex, MD, Cleda Daub, RN, William Dalton,                            Technician Referring MD:              Medicines:                General Anesthesia Complications:            No immediate complications. Estimated Blood Loss:     Estimated blood loss: none. Procedure:                Pre-Anesthesia Assessment:                           - Prior to the procedure, a History and Physical                            was performed, and patient medications and                            allergies were reviewed. The patient's tolerance of                            previous anesthesia was also reviewed. The risks                            and benefits of the procedure and the sedation                            options and risks were discussed with the patient.                            All questions were answered, and informed consent                            was obtained. Prior Anticoagulants: The patient has                            taken Coumadin (warfarin), last dose was 1 day                            prior to procedure. ASA  Grade Assessment: II - A                            patient with mild systemic disease. After reviewing                            the risks and benefits, the patient was deemed in                            satisfactory condition to undergo the procedure.                           - Prior to the procedure, a History and Physical                            was performed, and patient medications and                           allergies were reviewed. The patient's tolerance of                            previous anesthesia was also reviewed. The risks                            and benefits of the procedure and the sedation                            options and risks were discussed with the patient.                            All questions were answered, and informed consent                            was obtained. Prior Anticoagulants: The patient has                            taken Coumadin (warfarin), last dose was 1 day                            prior to procedure. ASA Grade Assessment: II - A                            patient with mild systemic disease. After reviewing                            the risks and benefits, the patient was deemed in                            satisfactory condition to undergo the procedure.                           After obtaining informed consent, the colonoscope  was passed under direct vision. Throughout the                            procedure, the patient's blood pressure, pulse, and                            oxygen saturations were monitored continuously. The                            CF-HQ190L ([id number]) Olympus adult colon was                            introduced through the anus and advanced to the the                            terminal ileum. The terminal ileum, ileocecal                            valve, appendiceal orifice, and rectum were                            photographed. The colonoscopy was performed without                            difficulty. The patient tolerated the procedure                            well. The quality of the bowel preparation was                            adequate. Scope In: 8:36:39 AM Scope Out: 8:59:44 AM Scope Withdrawal Time: 0 hours 18 minutes 5 seconds  Total Procedure Duration: 0 hours 23 minutes 5 seconds  Findings:      External and internal hemorrhoids were found  during retroflexion, during       perianal exam and during digital exam. The hemorrhoids were small.      Multiple small-mouthed diverticula were found in the sigmoid colon.      Scattered small-mouthed diverticula were found in the descending colon       and transverse colon.      Two semi-sessile polyps were found in the rectum and proximal descending       colon. The polyps were small in size. These were biopsied with a cold       forceps for histology.      Multiple small localized angiodysplastic lesions without bleeding were       found in the proximal ascending colon and in the cecum. Fulguration to       ablate the lesion to prevent bleeding by argon plasma at 0.5       liters/minute and 20 watts was successful.      The terminal ileum appeared normal.      The exam was otherwise without abnormality. Impression:               - External and internal hemorrhoids.                           -  Diverticulosis in the sigmoid colon.                           - Diverticulosis in the descending colon and in the                            transverse colon.                           - Two small polyps in the rectum and in the                            proximal descending colon. Biopsied.                           - Multiple non-bleeding colonic angiodysplastic                            lesions. Treated with argon plasma coagulation                            (APC).                           - The examined portion of the ileum was normal.                           - The examination was otherwise normal. Recommendation:           - Patient has a contact number available for                            emergencies. The signs and symptoms of potential                            delayed complications were discussed with the                            patient. Return to normal activities tomorrow.                            Written discharge instructions were provided to the                             patient.                           - Soft diet today.                           - Continue present medications. Protonix once a day                           - Await pathology results.                           - Repeat colonoscopy  in 5-10 years for surveillance                            based on pathology results.                           - Return to GI office PRN.                           - Telephone GI clinic for pathology results in 1                            week.                           - Telephone GI clinic if symptomatic PRN. Okay with                            me to proceed with cardioversion and a coagulation Procedure Code(s):        --- Professional ---                           [phone], 59, Colonoscopy, flexible; with control of                            bleeding, any method                           45380, Colonoscopy, flexible; with biopsy, single                            or multiple Diagnosis Code(s):        --- Professional ---                           K62.1, Rectal polyp                           D12.4, Benign neoplasm of descending colon                           K55.20, Angiodysplasia of colon without hemorrhage                           R19.5, Other fecal abnormalities                           D50.0, Iron deficiency anemia secondary to blood                            loss (chronic)                           K57.30, Diverticulosis of large intestine without                            perforation or abscess without bleeding CPT copyright  2018 American Medical Association. All rights reserved. The codes documented in this report are preliminary and upon coder review may  be revised to meet current compliance requirements. Clarene Essex, MD 07/06/2018 9:24:59 AM This report has been signed electronically. Number of Addenda: 0

## 2018-07-07 ENCOUNTER — Telehealth: Payer: Self-pay | Admitting: Physician Assistant

## 2018-07-07 ENCOUNTER — Inpatient Hospital Stay (HOSPITAL_COMMUNITY): Payer: Medicare Other | Admitting: Anesthesiology

## 2018-07-07 ENCOUNTER — Encounter (HOSPITAL_COMMUNITY): Payer: Self-pay | Admitting: *Deleted

## 2018-07-07 ENCOUNTER — Other Ambulatory Visit: Payer: Self-pay | Admitting: Physician Assistant

## 2018-07-07 ENCOUNTER — Encounter (HOSPITAL_COMMUNITY)
Admission: EM | Disposition: A | Discharge status: Home / Self Care | Payer: Self-pay | Source: Home / Self Care | Attending: Cardiovascular Disease

## 2018-07-07 ENCOUNTER — Inpatient Hospital Stay (HOSPITAL_COMMUNITY): Payer: Medicare Other

## 2018-07-07 DIAGNOSIS — I4891 Unspecified atrial fibrillation: Secondary | ICD-10-CM

## 2018-07-07 DIAGNOSIS — I340 Nonrheumatic mitral (valve) insufficiency: Secondary | ICD-10-CM

## 2018-07-07 DIAGNOSIS — R072 Precordial pain: Secondary | ICD-10-CM

## 2018-07-07 HISTORY — PX: CARDIOVERSION: SHX1299

## 2018-07-07 HISTORY — PX: TEE WITHOUT CARDIOVERSION: SHX5443

## 2018-07-07 LAB — BASIC METABOLIC PANEL
Anion gap: 6 (ref 5–15)
BUN: 17 mg/dL (ref 8–23)
CO2: 24 mmol/L (ref 22–32)
Calcium: 8.4 mg/dL — ABNORMAL LOW (ref 8.9–10.3)
Chloride: 109 mmol/L (ref 98–111)
Creatinine, Ser: 1.23 mg/dL — ABNORMAL HIGH (ref 0.44–1.00)
GFR calc Af Amer: 50 mL/min — ABNORMAL LOW (ref >60)
GFR calc non Af Amer: 43 mL/min — ABNORMAL LOW (ref >60)
Glucose, Bld: 150 mg/dL — ABNORMAL HIGH (ref 70–99)
Potassium: 4.2 mmol/L (ref 3.5–5.1)
Sodium: 139 mmol/L (ref 135–145)

## 2018-07-07 LAB — CBC
HCT: 32.9 % — ABNORMAL LOW (ref 36.0–46.0)
Hemoglobin: 9.3 g/dL — ABNORMAL LOW (ref 12.0–15.0)
MCH: 17.5 pg — ABNORMAL LOW (ref 26.0–34.0)
MCHC: 28.3 g/dL — ABNORMAL LOW (ref 30.0–36.0)
MCV: 62 fL — ABNORMAL LOW (ref 80.0–100.0)
Platelets: 179 10*3/uL (ref 150–400)
RBC: 5.31 MIL/uL — ABNORMAL HIGH (ref 3.87–5.11)
RDW: 19.9 % — ABNORMAL HIGH (ref 11.5–15.5)
WBC: 8.1 10*3/uL (ref 4.0–10.5)
nRBC: 0 % (ref 0.0–0.2)

## 2018-07-07 LAB — GLUCOSE, CAPILLARY
Glucose-Capillary: 144 mg/dL — ABNORMAL HIGH (ref 70–99)
Glucose-Capillary: 123 mg/dL — ABNORMAL HIGH (ref 70–99)
Glucose-Capillary: 355 mg/dL — ABNORMAL HIGH (ref 70–99)
Glucose-Capillary: 357 mg/dL — ABNORMAL HIGH (ref 70–99)

## 2018-07-07 LAB — PROTIME-INR
INR: 2.2
INR: 2.21
Prothrombin Time: 24.2 seconds — ABNORMAL HIGH (ref 11.4–15.2)
Prothrombin Time: 24.2 seconds — ABNORMAL HIGH (ref 11.4–15.2)

## 2018-07-07 SURGERY — ECHOCARDIOGRAM, TRANSESOPHAGEAL
Anesthesia: General

## 2018-07-07 MED ORDER — LIDOCAINE 2% (20 MG/ML) 5 ML SYRINGE
INTRAMUSCULAR | Status: DC | PRN
  Administered 2018-07-07: 100 mg via INTRAVENOUS

## 2018-07-07 MED ORDER — METOPROLOL TARTRATE 25 MG PO TABS: 25.0000 mg | ORAL_TABLET | Freq: Two times a day (BID) | ORAL | 5 refills | Status: DC

## 2018-07-07 MED ORDER — FUROSEMIDE 20 MG PO TABS: 20.0000 mg | ORAL_TABLET | Freq: Every day | ORAL | Status: DC

## 2018-07-07 MED ORDER — FUROSEMIDE 20 MG PO TABS: 20.0000 mg | ORAL_TABLET | Freq: Every day | ORAL | 0 refills | Status: DC

## 2018-07-07 MED ORDER — FENTANYL CITRATE (PF) 100 MCG/2ML IJ SOLN
INTRAMUSCULAR | Status: DC | PRN
  Administered 2018-07-07: 50 ug via INTRAVENOUS

## 2018-07-07 MED ORDER — SODIUM CHLORIDE 0.9 % IV SOLN: INTRAVENOUS | Status: DC

## 2018-07-07 MED ORDER — PROPOFOL 10 MG/ML IV BOLUS
INTRAVENOUS | Status: DC | PRN
  Administered 2018-07-07: 50 mg via INTRAVENOUS
  Administered 2018-07-07: 200 mg via INTRAVENOUS

## 2018-07-07 MED ORDER — DILTIAZEM HCL 90 MG PO TABS: 90.0000 mg | ORAL_TABLET | Freq: Four times a day (QID) | ORAL | 5 refills | Status: DC

## 2018-07-07 MED ORDER — FUROSEMIDE 40 MG PO TABS: 40.0000 mg | ORAL_TABLET | Freq: Every day | ORAL | Status: DC

## 2018-07-07 MED ORDER — PANTOPRAZOLE SODIUM 40 MG PO TBEC: 40.0000 mg | DELAYED_RELEASE_TABLET | Freq: Every day | ORAL | 3 refills | Status: DC

## 2018-07-07 MED ORDER — LACTATED RINGERS IV SOLN
INTRAVENOUS | Status: DC | PRN
  Administered 2018-07-07: 13:00:00 via INTRAVENOUS

## 2018-07-07 MED ORDER — DEXAMETHASONE SODIUM PHOSPHATE 10 MG/ML IJ SOLN
INTRAMUSCULAR | Status: DC | PRN
  Administered 2018-07-07: 8 mg via INTRAVENOUS

## 2018-07-07 MED ORDER — ONDANSETRON HCL 4 MG/2ML IJ SOLN
INTRAMUSCULAR | Status: DC | PRN
  Administered 2018-07-07: 4 mg via INTRAVENOUS

## 2018-07-07 MED ORDER — SUCCINYLCHOLINE CHLORIDE 20 MG/ML IJ SOLN
INTRAMUSCULAR | Status: DC | PRN
  Administered 2018-07-07: 120 mg via INTRAVENOUS

## 2018-07-07 MED ORDER — WARFARIN SODIUM 5 MG PO TABS
5.0000 mg | ORAL_TABLET | Freq: Once | ORAL | Status: AC
  Administered 2018-07-07: 5 mg via ORAL
  Filled 2018-07-07: qty 1

## 2018-07-07 MED ORDER — FUROSEMIDE 10 MG/ML IJ SOLN
40.0000 mg | Freq: Once | INTRAMUSCULAR | Status: AC
  Administered 2018-07-07: 40 mg via INTRAVENOUS
  Filled 2018-07-07: qty 4

## 2018-07-07 MED ORDER — LACTATED RINGERS IV SOLN: INTRAVENOUS | Status: DC | PRN

## 2018-07-07 NOTE — Progress Notes (Signed)
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

## 2018-07-07 NOTE — CV Procedure (Signed)
Brief TEE Note  LVEF 55-60% No LA/LAA thrombus or mass +PFO noted with color flow Doppler Mild TR and MR  For additional details see full report.  Electrical Cardioversion Procedure Note Jenna Mcdonald [id number] [birth year]  Procedure: Electrical Cardioversion Indications:  Atrial Flutter  Procedure Details Consent: Risks of procedure as well as the alternatives and risks of each were explained to the (patient/caregiver).  Consent for procedure obtained. Time Out: Verified patient identification, verified procedure, site/side was marked, verified correct patient position, special equipment/implants available, medications/allergies/relevent history reviewed, required imaging and test results available.  Performed  Patient placed on cardiac monitor, pulse oximetry, supplemental oxygen as necessary.  Sedation given: propofol, fentanyl Pacer pads placed anterior and posterior chest.  Cardioverted 2 time(s).  Cardioverted at 150J unsuccessful.  200J successful Evaluation Findings: Post procedure EKG shows: sinus rhythm with occasional PVCs.  Complications: None Patient did tolerate procedure well.   [name], MD 07/07/2018, 2:14 PM

## 2018-07-07 NOTE — Discharge Summary (Addendum)
Discharge Summary    Patient ID: Jenna Mcdonald MRN: [id number]; DOB: [birth year]  Admit date: 07/02/2018 Discharge date: 07/07/2018  Primary Care Provider: Josetta Huddle, MD  Primary Cardiologist: Fransico Him, MD  Primary Electrophysiologist:  None   Discharge Diagnoses    Principal Problem:   Atrial fibrillation with RVR [facility]) Active Problems:   Persistent atrial fibrillation ([place])   Essential hypertension   OSA (obstructive sleep apnea)   Obesity, Class III, BMI 40-49.9 (morbid obesity) ([place])   Type 2 diabetes mellitus without complication (HCC)   Aortic stenosis   Atrial fibrillation (HCC)   Anemia   Heme positive stool   Allergies Allergies  Allergen Reactions  . Aromasin [Exemestane]     Hair loss   . Femara [Letrozole]     unknown  . Gemfibrozil Other (See Comments)    Other reaction(s): Other (See Comments) Unknown Severe fatigue  Other reaction(s): Other (See Comments) Unknown Severe fatigue Unknown  . Hydrocodone-Acetaminophen Nausea And Vomiting    Patient reports terrible nausea; plain tylenol is ok with patient  . Letrozole Other (See Comments)    Unknown reactions   . Metoprolol Diarrhea    Fatigue   . Other Other (See Comments)    Other reaction(s): Other (See Comments) Numerous cancer drugs - unknown reaction Other reaction(s): Other (See Comments) Numerous cancer drugs - unknown reaction Numerous cancer drugs - unknown reaction  . Tamoxifen     Bone and body aches   . Tetracycline Other (See Comments)    Other reaction(s): Other (See Comments) Unknown reaction Other reaction(s): Other (See Comments) Unknown reaction Unknown reaction  . Tetracyclines & Related Other (See Comments)    Vaginal infections    . Toprol Xl [Metoprolol Tartrate]     Hair loss  . Verapamil Other (See Comments)    Other reaction(s): Other (See Comments) Unknown reaction Other reaction(s): Other (See Comments) Unknown reaction Unknown reaction  .  Vicodin [Hydrocodone-Acetaminophen] Nausea And Vomiting    Patient reports terrible nausea; plain tylenol is ok with patient    Diagnostic Studies/Procedures    TEE 07/07/2018 LV EF: 55% -   60% Study Conclusions  - Left ventricle: Systolic function was normal. The estimated   ejection fraction was in the range of 55% to 60%. No evidence of   thrombus. - Aortic valve: No evidence of vegetation. - Mitral valve: No evidence of vegetation. There was mild   regurgitation. - Left atrium: No evidence of thrombus in the atrial cavity or   appendage. No evidence of thrombus in the atrial cavity or   appendage. No evidence of thrombus in the atrial cavity or   appendage. - Right atrium: No evidence of thrombus in the atrial cavity or   appendage. - Atrial septum: There was a patent foramen ovale with flow noted   by color flow Doppler. - Tricuspid valve: No evidence of vegetation. - Pulmonic valve: No evidence of vegetation.  Impressions:  - Successful cardioversion. No cardiac source of emboli was   indentified. _____________   History of Present Illness     Jenna Mcdonald is a 72yo F with a hx of persistent atrial fibrillation on chronic Coumadin followed with PCP for INR checks, OSA on BiPAP, HTN, mild AS by echo 1/2018and morbid obesity who presented to [facility] on [facility]18 with c/o SOB, dry cough and nasal congestion for the last two weeks. She states that two days ago she began feeling overwhelmingly weak and fatigued. Yesterday she notes that she began  feeling dizzy and began experiencing lightnheadedness with change in position as well as exertional dyspnea and intermittent chest pressure without radiation. She states that she has never experienced chest pain with her AF in the past. She reports that her chest pressure is improved from presentation however is still there. It is reproducible on exam. She reports that she has a home monitor and checked her rate at home which was reported  as "possible tachycardia" with a rate at 160bpm. She reports full medication compliance with Coumadin and diltiazem. She denies palpitations, LE swelling, orthopnea symptoms, fever, chills, N/V or syncope. Given her symptoms she decided to come to the ED for further evaluation   In the ED, she was found to be in atrial fibrillation with RVR with a HR in the 140's sustained. CXR performed secondary to recent c/o of URI symptoms which showed bronchitic changes but no acute cardiopulmonary findings. Creatinine was mildly elevated at 1.15 BP remained stable. She was placed on a diltiazem gtt with bolus for rate control with HR response to the 90-100's. INR found to be therapeutic at 3.19.  Of note, she was last seen by her primary cardiologist on 12/21/17 for CPAP, HTN and AF follow up. At that time, she was doing well with no complaints and no changes made to medical plan.   Hospital Course     Consultants: Dr. Watt Climes of GI service  Patient was admitted to the hospital with atrial fibrillation with RVR.  She was started on diltiazem drip.  She was also started on metoprolol tartrate for better rate control.  Her previous INR in October 2019 was subtherapeutic at 1.90.  During the hospitalization, her INR level has been therapeutic.  On 07/05/2018, her hemoglobin dropped to 9.2.  She also had a positive guaiac stool.  GI was consulted and patient was seen by Dr. Watt Climes.  She underwent EGD and colonoscopy on 07/06/2018.  EGD showed multiple nonbleeding duodenal ulcer, biopsy was taken out to rule out H. pylori.  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  Patient eventually underwent TEE cardioversion on 07/07/2018 with successful conversion to sinus rhythm.  During this hospitalization, she also complained of occasional chest discomfort.  After discussing with her primary cardiologist, we decided to order outpatient coronary CT.  I have sent a  staff message to our scheduler to arrange this.  She also had signs of mild volume overload on 07/07/2018, she was given a single dose of 40 mg IV Lasix.  She will be discharged on 20 mg daily of Lasix.  I have arranged 1 week follow-up at which time a basic metabolic panel need to be obtained to see if patient need to be continue on the Lasix or not.  There is a possibility that patient may not need long-term diuretic.  Otherwise, I have instructed the patient to have Coumadin clinic visit at her PCPs office in 5 days.  She was seen 2 hours after the cardioversion, her heart rate is quite stable without any acute issues.  She is deemed stable for discharge.  Note, she was discharged on short acting diltiazem given cost issues. She will continue on this short acting diltiazem. CBC will need to be rechecked on followup  _____________  Discharge Vitals Blood pressure 129/67, pulse 68, temperature (!) 97.5 F (36.4 C), temperature source Axillary, resp. rate (!) 29, weight 114.4 kg, SpO2 93 %.  Filed Weights   07/05/18 0637 07/06/18 0627 07/07/18 0627  Weight:  113.5 kg 114.2 kg 114.4 kg    Labs & Radiologic Studies    CBC Recent Labs    07/06/18 0401 07/07/18 0405  WBC 6.4 8.1  HGB 9.0* 9.3*  HCT 32.2* 32.9*  MCV 61.7* 62.0*  PLT 171 741   Basic Metabolic Panel Recent Labs    07/05/18 0515 07/07/18 0734  NA 140 139  K 4.5 4.2  CL 110 109  CO2 22 24  GLUCOSE 139* 150*  BUN 16 17  CREATININE 1.21* 1.23*  CALCIUM 8.2* 8.4*   Liver Function Tests No results for input(s): AST, ALT, ALKPHOS, BILITOT, PROT, ALBUMIN in the last 72 hours. No results for input(s): LIPASE, AMYLASE in the last 72 hours. Cardiac Enzymes No results for input(s): CKTOTAL, CKMB, CKMBINDEX, TROPONINI in the last 72 hours. BNP Invalid input(s): POCBNP D-Dimer No results for input(s): DDIMER in the last 72 hours. Hemoglobin A1C No results for input(s): HGBA1C in the last 72 hours. Fasting Lipid  Panel No results for input(s): CHOL, HDL, LDLCALC, TRIG, CHOLHDL, LDLDIRECT in the last 72 hours. Thyroid Function Tests No results for input(s): TSH, T4TOTAL, T3FREE, THYROIDAB in the last 72 hours.  Invalid input(s): FREET3 _____________  Dg Chest Port 1 View  Result Date: 07/02/2018 CLINICAL DATA:  Cough. EXAM: PORTABLE CHEST 1 VIEW COMPARISON:  12/04/2015 FINDINGS: There is peribronchial thickening with slight interstitial accentuation at the lung bases without acute infiltrates or effusions. Heart size and pulmonary vascularity are normal. No significant bone abnormality. IMPRESSION: Bronchitic changes. Electronically Signed   By: Lorriane Shire M.D.   On: 07/02/2018 11:08   Disposition   Pt is being discharged home today in good condition.  Follow-up Plans & Appointments    Follow-up Information    Charlie Pitter, PA-C Follow up on 07/16/2018.   Specialties:  Cardiology, Radiology Why:  @10 :30AM. Cardiology office. Contact information: [address] [place] [address] [phone]        Josetta Huddle, MD Follow up on 07/12/2018.   Specialty:  Internal Medicine Why:  check coumadin INR level monday Contact information: [address] [place] [address] [place] Follow up.   Specialty:  Cardiology Why:  atrial fibrillation clinic will call you to arrange 2 week visit Contact information: [address] [id number] [place] [phone]       [place] [facility] Follow up.   Specialty:  Cardiology Why:  cardiology office scheduler will contact you to arrange outpatient coronary CT to assess your chest pain.  Contact information: [address], [place] [phone]         Discharge Instructions    Diet - low sodium heart healthy   Complete by:  As directed    Increase activity  slowly   Complete by:  As directed       Discharge Medications   Allergies as of 07/07/2018      Reactions   Aromasin [exemestane]    Hair loss   Femara [letrozole]    unknown   Gemfibrozil Other (See Comments)   Other reaction(s): Other (See Comments) Unknown Severe fatigue Other reaction(s): Other (See Comments) Unknown Severe fatigue Unknown   Hydrocodone-acetaminophen Nausea And Vomiting   Patient reports terrible nausea; plain tylenol is ok with patient   Letrozole Other (See Comments)   Unknown reactions    Metoprolol Diarrhea  Fatigue   Other Other (See Comments)   Other reaction(s): Other (See Comments) Numerous cancer drugs - unknown reaction Other reaction(s): Other (See Comments) Numerous cancer drugs - unknown reaction Numerous cancer drugs - unknown reaction   Tamoxifen    Bone and body aches   Tetracycline Other (See Comments)   Other reaction(s): Other (See Comments) Unknown reaction Other reaction(s): Other (See Comments) Unknown reaction Unknown reaction   Tetracyclines & Related Other (See Comments)   Vaginal infections    Toprol Xl [metoprolol Tartrate]    Hair loss   Verapamil Other (See Comments)   Other reaction(s): Other (See Comments) Unknown reaction Other reaction(s): Other (See Comments) Unknown reaction Unknown reaction   Vicodin [hydrocodone-acetaminophen] Nausea And Vomiting   Patient reports terrible nausea; plain tylenol is ok with patient      Medication List    TAKE these medications   acetaminophen 500 MG tablet Commonly known as:  TYLENOL Take 500 mg by mouth every 6 (six) hours as needed for mild pain. For back pain   Biotin 10 MG Tabs Take 10 mg by mouth 2 (two) times daily.   CALCIUM 600 + D PO Take 1 tablet by mouth daily.   clobetasol 0.05 % external solution Commonly known as:  TEMOVATE Apply 1 application topically 2 (two) times daily.   diltiazem 90 MG tablet Commonly known as:  CARDIZEM Take 1  tablet (90 mg total) by mouth every 6 (six) hours. Start taking on:  07/08/2018 What changed:  See the new instructions.   diphenhydramine-acetaminophen 25-500 MG Tabs tablet Commonly known as:  TYLENOL PM Take 1 tablet by mouth at bedtime.   furosemide 20 MG tablet Commonly known as:  LASIX Take 1 tablet (20 mg total) by mouth daily. Start taking on:  07/08/2018   glimepiride 4 MG tablet Commonly known as:  AMARYL Take 2 mg by mouth daily.   metFORMIN 500 MG 24 hr tablet Commonly known as:  GLUCOPHAGE-XR Take 1 tablet by mouth 2 (two) times daily.   metoprolol tartrate 25 MG tablet Commonly known as:  LOPRESSOR Take 1 tablet (25 mg total) by mouth 2 (two) times daily.   multivitamin with minerals Tabs tablet Take 1 tablet by mouth daily.   pantoprazole 40 MG tablet Commonly known as:  PROTONIX Take 1 tablet (40 mg total) by mouth daily. Start taking on:  07/08/2018   triamcinolone cream 0.1 % Commonly known as:  KENALOG Apply 1 application topically 2 (two) times daily.   warfarin 5 MG tablet Commonly known as:  COUMADIN Take 5 mg by mouth See admin instructions. 7.5 mg  Thursday. 5mg  all the other days        Acute coronary syndrome (MI, NSTEMI, STEMI, etc) this admission?: No.    Outstanding Labs/Studies   CBC on followup PT/INR by PCP's office next Friday Coronary CT as outpatient. 1 week followup with Dr. Theodosia Blender office 2 week followup with afib clinic   Duration of Discharge Encounter   Greater than 30 minutes including physician time.  Hilbert Corrigan, PA 07/07/2018, 7:30 PM

## 2018-07-07 NOTE — Anesthesia Preprocedure Evaluation (Signed)
Anesthesia Evaluation  Patient identified by MRN, date of birth, ID band Patient awake    Reviewed: Allergy & Precautions, NPO status , Patient's Chart, lab work & pertinent test results  History of Anesthesia Complications (+) PONVNegative for: history of anesthetic complications  Airway Mallampati: III  TM Distance: >3 FB Neck ROM: Full    Dental no notable dental hx.    Pulmonary sleep apnea and Continuous Positive Airway Pressure Ventilation , former smoker,    Pulmonary exam normal        Cardiovascular hypertension, Pt. on medications + dysrhythmias Atrial Fibrillation + Valvular Problems/Murmurs ("very mild") AS  Rhythm:Irregular Rate:Normal     Neuro/Psych negative neurological ROS  negative psych ROS   GI/Hepatic Neg liver ROS, GERD  ,  Endo/Other  diabetesMorbid obesity  Renal/GU negative Renal ROS  negative genitourinary   Musculoskeletal negative musculoskeletal ROS (+)   Abdominal (+) + obese,   Peds  Hematology negative hematology ROS (+)   Anesthesia Other Findings   Reproductive/Obstetrics                             Anesthesia Physical Anesthesia Plan  ASA: III  Anesthesia Plan: General   Post-op Pain Management:    Induction: Intravenous  PONV Risk Score and Plan: 4 or greater  Airway Management Planned: Oral ETT  Additional Equipment: None  Intra-op Plan:   Post-operative Plan: Extubation in OR  Informed Consent: I have reviewed the patients History and Physical, chart, labs and discussed the procedure including the risks, benefits and alternatives for the proposed anesthesia with the patient or authorized representative who has indicated his/her understanding and acceptance.     Plan Discussed with:   Anesthesia Plan Comments:         Anesthesia Quick Evaluation

## 2018-07-07 NOTE — Transfer of Care (Signed)
Immediate Anesthesia Transfer of Care Note  Patient: Jenna Mcdonald  Procedure(s) Performed: TRANSESOPHAGEAL ECHOCARDIOGRAM (TEE) (N/A ) CARDIOVERSION (N/A )  Patient Location: Endoscopy Unit  Anesthesia Type:General  Level of Consciousness: awake and patient cooperative  Airway & Oxygen Therapy: Patient Spontanous Breathing and Patient connected to face mask oxygen  Post-op Assessment: Report given to RN and Post -op Vital signs reviewed and stable  Post vital signs: Reviewed and stable  Last Vitals:  Vitals Value Taken Time  BP    Temp 36.9 C 07/07/2018  2:20 PM  Pulse    Resp    SpO2      Last Pain:  Vitals:   07/07/18 1420  TempSrc: Oral  PainSc:       Patients Stated Pain Goal: 0 (78/24/23 5361)  Complications: No apparent anesthesia complications

## 2018-07-07 NOTE — Telephone Encounter (Signed)
° ° °  TOC appt requested by Eulas Post  Appt scheduled for  11/22 Sharrell Ku

## 2018-07-07 NOTE — Progress Notes (Signed)
  Echocardiogram Echocardiogram Transesophageal has been performed.  Darlina Sicilian M 07/07/2018, 2:43 PM

## 2018-07-07 NOTE — Telephone Encounter (Signed)
**Note De-identified Jenna Mcdonald Obfuscation** The pt is being discharged today. We will call her tomorrow. 

## 2018-07-07 NOTE — Progress Notes (Signed)
ANTICOAGULATION CONSULT NOTE - Follow Up Consult  Pharmacy Consult for Warfarin Indication: atrial fibrillation  Allergies  Allergen Reactions  . Aromasin [Exemestane]     Hair loss   . Femara [Letrozole]     unknown  . Gemfibrozil Other (See Comments)    Other reaction(s): Other (See Comments) Unknown Severe fatigue  Other reaction(s): Other (See Comments) Unknown Severe fatigue Unknown  . Hydrocodone-Acetaminophen Nausea And Vomiting    Patient reports terrible nausea; plain tylenol is ok with patient  . Letrozole Other (See Comments)    Unknown reactions   . Metoprolol Diarrhea    Fatigue   . Other Other (See Comments)    Other reaction(s): Other (See Comments) Numerous cancer drugs - unknown reaction Other reaction(s): Other (See Comments) Numerous cancer drugs - unknown reaction Numerous cancer drugs - unknown reaction  . Tamoxifen     Bone and body aches   . Tetracycline Other (See Comments)    Other reaction(s): Other (See Comments) Unknown reaction Other reaction(s): Other (See Comments) Unknown reaction Unknown reaction  . Tetracyclines & Related Other (See Comments)    Vaginal infections    . Toprol Xl [Metoprolol Tartrate]     Hair loss  . Verapamil Other (See Comments)    Other reaction(s): Other (See Comments) Unknown reaction Other reaction(s): Other (See Comments) Unknown reaction Unknown reaction  . Vicodin [Hydrocodone-Acetaminophen] Nausea And Vomiting    Patient reports terrible nausea; plain tylenol is ok with patient    Patient Measurements: Weight: 252 lb 3.3 oz (114.4 kg) Heparin Dosing Weight: 84 kg  Vital Signs: BP: 128/62 (11/13 0821) Pulse Rate: 91 (11/13 0821)  Labs: Recent Labs    07/04/18 1842  07/05/18 0515 07/06/18 0401 07/07/18 0405 07/07/18 0734  HGB  --    < > 9.2* 9.0* 9.3*  --   HCT  --   --  31.9* 32.2* 32.9*  --   PLT  --   --  150 171 179  --   LABPROT  --    < > 22.7* 22.8* 24.2* 24.2*  INR  --    <  > 2.04 2.04 2.20 2.21  HEPARINUNFRC 0.11*  --  0.28*  --   --   --   CREATININE  --   --  1.21*  --   --  1.23*   < > = values in this interval not displayed.    CrCl cannot be calculated (Unknown ideal weight.).   Medications:  Scheduled:  . diltiazem  90 mg Oral Q6H  . insulin aspart  0-15 Units Subcutaneous TID WC  . metoprolol tartrate  25 mg Oral BID  . pantoprazole  40 mg Oral Daily  . sodium chloride flush  3 mL Intravenous Q12H  . sodium chloride flush  3 mL Intravenous Q12H  . Warfarin - Pharmacist Dosing Inpatient   Does not apply q1800    Assessment: 72 YO female on warfarin 5mg  daily exc 7.5mg  on Thurs PTA for AFib. INR on admission = 3.1. Now INR stable at 2.21. Dose held on 11/11 due to NPO and for EGD / colonoscopy on 11/12. Now to continue with plans for DCCV today. Hgb low but stable at 9.3, plts wnl.  Goal of Therapy:  Heparin level 0.3-0.7 units/ml Monitor platelets by anticoagulation protocol: Yes   Plan:  Give Coumadin 5mg  PO x 1 tonight Monitor daily INR, CBC, s/s of bleed Plan for DCCV today  Elenor Quinones, PharmD, BCPS Clinical Pharmacist Phone number #  [phone] 07/07/2018 8:47 AM

## 2018-07-07 NOTE — Discharge Instructions (Signed)

## 2018-07-07 NOTE — Progress Notes (Signed)
Called Dr. Marcellus Scott' office INR labs as follows: 10/6  INR 1.90 (instructed the patient to take 7.5 coumadin x 1 before going back to 5mg  daily) 9/19  INR 2.80 8/24  INR 2.60  INR labs given to Dr. Oval Linsey, record will be faxed to cath lab.  Given subtherapeutic INR on 10/6, will proceed with TEE  SignedAlmyra Deforest PA Pager: [phone]

## 2018-07-07 NOTE — Interval H&P Note (Signed)
History and Physical Interval Note:  07/07/2018 1:13 PM  Jenna Mcdonald  has presented today for surgery, with the diagnosis of afib  The various methods of treatment have been discussed with the patient and family. After consideration of risks, benefits and other options for treatment, the patient has consented to  Procedure(s): TRANSESOPHAGEAL ECHOCARDIOGRAM (TEE) (N/A) CARDIOVERSION (N/A) as a surgical intervention .  The patient's history has been reviewed, patient examined, no change in status, stable for surgery.  I have reviewed the patient's chart and labs.  Questions were answered to the patient's satisfaction.     [name]

## 2018-07-07 NOTE — Progress Notes (Signed)
Progress Note  Patient Name: Jenna Mcdonald Date of Encounter: 07/07/2018  Primary Cardiologist: Fransico Him, MD   Subjective   Dyspneic last night.   Inpatient Medications    Scheduled Meds: . diltiazem  90 mg Oral Q6H  . insulin aspart  0-15 Units Subcutaneous TID WC  . metoprolol tartrate  25 mg Oral BID  . pantoprazole  40 mg Oral Daily  . sodium chloride flush  3 mL Intravenous Q12H  . sodium chloride flush  3 mL Intravenous Q12H  . Warfarin - Pharmacist Dosing Inpatient   Does not apply q1800   Continuous Infusions: . sodium chloride    . sodium chloride    . sodium chloride    . sodium chloride    . sodium chloride     PRN Meds: acetaminophen, diphenhydrAMINE, guaiFENesin-dextromethorphan, ondansetron (ZOFRAN) IV, phenol, sodium chloride flush, sodium chloride flush   Vital Signs    Vitals:   07/06/18 2350 07/07/18 0358 07/07/18 0532 07/07/18 0627  BP: 109/60 115/69 127/83 127/83  Pulse:    88  Resp:      Temp:      TempSrc:      SpO2:    95%  Weight:    114.4 kg    Intake/Output Summary (Last 24 hours) at 07/07/2018 0819 Last data filed at 07/06/2018 1757 Gross per 24 hour  Intake 882 ml  Output -  Net 882 ml   Filed Weights   07/05/18 2751 07/06/18 0627 07/07/18 0627  Weight: 113.5 kg 114.2 kg 114.4 kg    Telemetry    Atrial fibrillation, rate controlled - Personally Reviewed  ECG    Atrial fibrillation with TWI in inferior leads - Personally Reviewed  Physical Exam   GEN: No acute distress.   Neck: No JVD Cardiac: irregular, no murmurs, rubs, or gallops.  Respiratory: Clear to auscultation bilaterally. GI: Soft, nontender, non-distended  MS: No edema; No deformity. Neuro:  Nonfocal  Psych: Normal affect   Labs    Chemistry Recent Labs  Lab 07/02/18 1030 07/03/18 0611 07/04/18 0457 07/05/18 0515  NA 141 140 138 140  K 4.6 4.5 3.9 4.5  CL 110 113* 111 110  CO2 22 20* 21* 22  GLUCOSE 133* 138* 153* 139*  BUN 15  14 14 16   CREATININE 1.15* 1.22* 1.09* 1.21*  CALCIUM 9.4 8.3* 8.1* 8.2*  PROT 7.0  --   --   --   ALBUMIN 3.9  --   --   --   AST 45*  --   --   --   ALT 45*  --   --   --   ALKPHOS 80  --   --   --   BILITOT 0.5  --   --   --   GFRNONAA 46* 43* 49* 44*  GFRAA 54* 50* 57* 51*  ANIONGAP 9 7 6 8      Hematology Recent Labs  Lab 07/05/18 0515 07/05/18 1026 07/06/18 0401 07/07/18 0405  WBC 7.5  --  6.4 8.1  RBC 5.12* 5.38* 5.22* 5.31*  HGB 9.2*  --  9.0* 9.3*  HCT 31.9*  --  32.2* 32.9*  MCV 62.3*  --  61.7* 62.0*  MCH 18.0*  --  17.2* 17.5*  MCHC 28.8*  --  28.0* 28.3*  RDW 19.8*  --  19.5* 19.9*  PLT 150  --  171 179    Cardiac Enzymes Recent Labs  Lab 07/02/18 1705 07/02/18 2259 07/03/18 7001  TROPONINI <0.03 <0.03 <0.03    Recent Labs  Lab 07/02/18 1049  TROPIPOC 0.01     BNPNo results for input(s): BNP, PROBNP in the last 168 hours.   DDimer No results for input(s): DDIMER in the last 168 hours.   Radiology    No results found.  Cardiac Studies   Echo 09/24/2016 LV EF: 55% -   60% Study Conclusions  - Left ventricle: The cavity size was normal. Wall thickness was   increased in a pattern of moderate LVH. Systolic function was   normal. The estimated ejection fraction was in the range of 55%   to 60%. Wall motion was normal; there were no regional wall   motion abnormalities. Left ventricular diastolic function   parameters were normal. - Aortic valve: There was very mild stenosis. - Mitral valve: nodular calcification of anterior mitral leaflet.   Calcified annulus. Mildly thickened leaflets . - Left atrium: The atrium was moderately dilated. - Atrial septum: No defect or patent foramen ovale was identified.  Patient Profile     72 y.o. female w/ PMH of PAF (on Coumadin), carotid artery stenosis, mild AD, HTN, HLD, and Type 2 DMand OSA on BiPAPwho [facility] for worsening URI symptoms and chest pain, found to be  in atrial fibrillation with RVR.  Assessment & Plan    1. Atrial fibrillation with RVR:   - planning for TEE DCCV today. If stable post cardioversion, will plan to discharge later this afternoon.   - followup in 1 week and afib clinic. Coumadin clinic in 4-5 days.  2. Dyspnea: dyspnea overnight when laying down, lung clear, but has 1+ pitting edema, felt to be related to volume overload in the setting of afib. Will give 40mg  IV lasix today and discharge. Discharge on 20mg  daily   - reassess on followup to determine if need to continue lasix.   3. Chest pain: plan to proceed with coronary CT as outpatient  4. Anemia: stool heme +, thalassemia minor hx  - GI consulted, EGD and colonoscopy yesterday. EGD showed multiple nonbleeding duodenal ulcer, biopsy taken to rule out h pylori. 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 HTN: BP well controlled.   6. OSA on CPAP  7. DM II  8. Aortic stenosis: mild on TTE. TEE this afternoon.   For questions or updates, please contact [place] Please consult [email or web address] for contact info under        Signed, Almyra Deforest, [place]  07/07/2018, 8:19 AM

## 2018-07-07 NOTE — H&P (View-Only) (Signed)
Progress Note  Patient Name: Jenna Mcdonald Date of Encounter: 07/07/2018  Primary Cardiologist: Fransico Him, MD   Subjective   Dyspneic last night.   Inpatient Medications    Scheduled Meds: . diltiazem  90 mg Oral Q6H  . insulin aspart  0-15 Units Subcutaneous TID WC  . metoprolol tartrate  25 mg Oral BID  . pantoprazole  40 mg Oral Daily  . sodium chloride flush  3 mL Intravenous Q12H  . sodium chloride flush  3 mL Intravenous Q12H  . Warfarin - Pharmacist Dosing Inpatient   Does not apply q1800   Continuous Infusions: . sodium chloride    . sodium chloride    . sodium chloride    . sodium chloride    . sodium chloride     PRN Meds: acetaminophen, diphenhydrAMINE, guaiFENesin-dextromethorphan, ondansetron (ZOFRAN) IV, phenol, sodium chloride flush, sodium chloride flush   Vital Signs    Vitals:   07/06/18 2350 07/07/18 0358 07/07/18 0532 07/07/18 0627  BP: 109/60 115/69 127/83 127/83  Pulse:    88  Resp:      Temp:      TempSrc:      SpO2:    95%  Weight:    114.4 kg    Intake/Output Summary (Last 24 hours) at 07/07/2018 0819 Last data filed at 07/06/2018 1757 Gross per 24 hour  Intake 882 ml  Output -  Net 882 ml   Filed Weights   07/05/18 5053 07/06/18 0627 07/07/18 0627  Weight: 113.5 kg 114.2 kg 114.4 kg    Telemetry    Atrial fibrillation, rate controlled - Personally Reviewed  ECG    Atrial fibrillation with TWI in inferior leads - Personally Reviewed  Physical Exam   GEN: No acute distress.   Neck: No JVD Cardiac: irregular, no murmurs, rubs, or gallops.  Respiratory: Clear to auscultation bilaterally. GI: Soft, nontender, non-distended  MS: No edema; No deformity. Neuro:  Nonfocal  Psych: Normal affect   Labs    Chemistry Recent Labs  Lab 07/02/18 1030 07/03/18 0611 07/04/18 0457 07/05/18 0515  NA 141 140 138 140  K 4.6 4.5 3.9 4.5  CL 110 113* 111 110  CO2 22 20* 21* 22  GLUCOSE 133* 138* 153* 139*  BUN 15  14 14 16   CREATININE 1.15* 1.22* 1.09* 1.21*  CALCIUM 9.4 8.3* 8.1* 8.2*  PROT 7.0  --   --   --   ALBUMIN 3.9  --   --   --   AST 45*  --   --   --   ALT 45*  --   --   --   ALKPHOS 80  --   --   --   BILITOT 0.5  --   --   --   GFRNONAA 46* 43* 49* 44*  GFRAA 54* 50* 57* 51*  ANIONGAP 9 7 6 8      Hematology Recent Labs  Lab 07/05/18 0515 07/05/18 1026 07/06/18 0401 07/07/18 0405  WBC 7.5  --  6.4 8.1  RBC 5.12* 5.38* 5.22* 5.31*  HGB 9.2*  --  9.0* 9.3*  HCT 31.9*  --  32.2* 32.9*  MCV 62.3*  --  61.7* 62.0*  MCH 18.0*  --  17.2* 17.5*  MCHC 28.8*  --  28.0* 28.3*  RDW 19.8*  --  19.5* 19.9*  PLT 150  --  171 179    Cardiac Enzymes Recent Labs  Lab 07/02/18 1705 07/02/18 2259 07/03/18 9767  TROPONINI <0.03 <0.03 <0.03    Recent Labs  Lab 07/02/18 1049  TROPIPOC 0.01     BNPNo results for input(s): BNP, PROBNP in the last 168 hours.   DDimer No results for input(s): DDIMER in the last 168 hours.   Radiology    No results found.  Cardiac Studies   Echo 09/24/2016 LV EF: 55% -   60% Study Conclusions  - Left ventricle: The cavity size was normal. Wall thickness was   increased in a pattern of moderate LVH. Systolic function was   normal. The estimated ejection fraction was in the range of 55%   to 60%. Wall motion was normal; there were no regional wall   motion abnormalities. Left ventricular diastolic function   parameters were normal. - Aortic valve: There was very mild stenosis. - Mitral valve: nodular calcification of anterior mitral leaflet.   Calcified annulus. Mildly thickened leaflets . - Left atrium: The atrium was moderately dilated. - Atrial septum: No defect or patent foramen ovale was identified.  Patient Profile     72 y.o. female w/ PMH of PAF (on Coumadin), carotid artery stenosis, mild AD, HTN, HLD, and Type 2 DMand OSA on BiPAPwho [facility] for worsening URI symptoms and chest pain, found to be  in atrial fibrillation with RVR.  Assessment & Plan    1. Atrial fibrillation with RVR:   - planning for TEE DCCV today. If stable post cardioversion, will plan to discharge later this afternoon.   - followup in 1 week and afib clinic. Coumadin clinic in 4-5 days.  2. Dyspnea: dyspnea overnight when laying down, lung clear, but has 1+ pitting edema, felt to be related to volume overload in the setting of afib. Will give 40mg  IV lasix today and discharge. Discharge on 20mg  daily   - reassess on followup to determine if need to continue lasix.   3. Chest pain: plan to proceed with coronary CT as outpatient  4. Anemia: stool heme +, thalassemia minor hx  - GI consulted, EGD and colonoscopy yesterday. EGD showed multiple nonbleeding duodenal ulcer, biopsy taken to rule out h pylori. 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 HTN: BP well controlled.   6. OSA on CPAP  7. DM II  8. Aortic stenosis: mild on TTE. TEE this afternoon.   For questions or updates, please contact [place] Please consult [email or web address] for contact info under        Signed, Almyra Deforest, [place]  07/07/2018, 8:19 AM

## 2018-07-07 NOTE — Anesthesia Postprocedure Evaluation (Signed)
Anesthesia Post Note  Patient: Jenna Mcdonald  Procedure(s) Performed: TRANSESOPHAGEAL ECHOCARDIOGRAM (TEE) (N/A ) CARDIOVERSION (N/A )     Patient location during evaluation: PACU Anesthesia Type: General Level of consciousness: awake and alert Pain management: pain level controlled Vital Signs Assessment: post-procedure vital signs reviewed and stable Respiratory status: spontaneous breathing, nonlabored ventilation, respiratory function stable and patient connected to nasal cannula oxygen Cardiovascular status: blood pressure returned to baseline and stable Postop Assessment: no apparent nausea or vomiting Anesthetic complications: no    Last Vitals:  Vitals:   07/07/18 1430 07/07/18 1440  BP: (!) 116/59 124/60  Pulse: 74 70  Resp: (!) 27 (!) 29  Temp:    SpO2: 93% 93%    Last Pain:  Vitals:   07/07/18 1440  TempSrc:   PainSc: 0-No pain                 Yakir Wenke DAVID

## 2018-07-07 NOTE — Anesthesia Procedure Notes (Signed)
Procedure Name: Intubation Date/Time: 07/07/2018 1:52 PM Performed by: Orlie Dakin, CRNA Pre-anesthesia Checklist: Patient identified, Emergency Drugs available, Suction available and Patient being monitored Patient Re-evaluated:Patient Re-evaluated prior to induction Oxygen Delivery Method: Circle system utilized Preoxygenation: Pre-oxygenation with 100% oxygen Induction Type: IV induction Laryngoscope Size: Miller and 3 Grade View: Grade II Tube type: Oral Tube size: 7.0 mm Number of attempts: 1 Airway Equipment and Method: Stylet Placement Confirmation: ETT inserted through vocal cords under direct vision,  positive ETCO2 and breath sounds checked- equal and bilateral Secured at: 23 cm Tube secured with: Tape Dental Injury: Teeth and Oropharynx as per pre-operative assessment  Comments: Electively intubated due to resolving bronchitis and coughing during colonoscopy/EGD 11/12.

## 2018-07-08 NOTE — Telephone Encounter (Signed)
1st TCM call:  I attempted a TCM call to this patient. There was no answer and no answering machine to leave message. Will try again at another time.

## 2018-07-09 NOTE — Telephone Encounter (Signed)
Patient contacted regarding discharge from [facility]  on 07/07/18.  Patient understands to follow up with provider Melina Copa PA-C on 07/16/18 at 57 at [facility] location. Patient understands discharge instructions? yes Patient understands medications and regiment? yes Patient understands to bring all medications to this visit? yes  Pt had no further questions or concerns at this time.

## 2018-07-12 ENCOUNTER — Encounter (HOSPITAL_COMMUNITY): Payer: Self-pay | Admitting: Emergency Medicine

## 2018-07-12 ENCOUNTER — Telehealth: Payer: Self-pay | Admitting: Cardiology

## 2018-07-12 ENCOUNTER — Other Ambulatory Visit: Payer: Self-pay

## 2018-07-12 ENCOUNTER — Inpatient Hospital Stay (HOSPITAL_COMMUNITY)
Admission: EM | Admit: 2018-07-12 | Discharge: 2018-07-14 | DRG: 310 | Disposition: A | Discharge status: Home / Self Care | Payer: Medicare Other | Attending: Cardiology | Admitting: Cardiology

## 2018-07-12 DIAGNOSIS — I10 Essential (primary) hypertension: Secondary | ICD-10-CM | POA: Diagnosis not present

## 2018-07-12 DIAGNOSIS — Z881 Allergy status to other antibiotic agents status: Secondary | ICD-10-CM

## 2018-07-12 DIAGNOSIS — K5730 Diverticulosis of large intestine without perforation or abscess without bleeding: Secondary | ICD-10-CM | POA: Diagnosis present

## 2018-07-12 DIAGNOSIS — I350 Nonrheumatic aortic (valve) stenosis: Secondary | ICD-10-CM | POA: Diagnosis present

## 2018-07-12 DIAGNOSIS — Z96652 Presence of left artificial knee joint: Secondary | ICD-10-CM | POA: Diagnosis present

## 2018-07-12 DIAGNOSIS — Z6839 Body mass index (BMI) 39.0-39.9, adult: Secondary | ICD-10-CM

## 2018-07-12 DIAGNOSIS — Z833 Family history of diabetes mellitus: Secondary | ICD-10-CM

## 2018-07-12 DIAGNOSIS — I4891 Unspecified atrial fibrillation: Secondary | ICD-10-CM

## 2018-07-12 DIAGNOSIS — Z885 Allergy status to narcotic agent status: Secondary | ICD-10-CM

## 2018-07-12 DIAGNOSIS — Z888 Allergy status to other drugs, medicaments and biological substances status: Secondary | ICD-10-CM

## 2018-07-12 DIAGNOSIS — Z7984 Long term (current) use of oral hypoglycemic drugs: Secondary | ICD-10-CM

## 2018-07-12 DIAGNOSIS — I4819 Other persistent atrial fibrillation: Secondary | ICD-10-CM | POA: Diagnosis not present

## 2018-07-12 DIAGNOSIS — G4733 Obstructive sleep apnea (adult) (pediatric): Secondary | ICD-10-CM | POA: Diagnosis present

## 2018-07-12 DIAGNOSIS — D509 Iron deficiency anemia, unspecified: Secondary | ICD-10-CM | POA: Diagnosis present

## 2018-07-12 DIAGNOSIS — E119 Type 2 diabetes mellitus without complications: Secondary | ICD-10-CM | POA: Diagnosis present

## 2018-07-12 DIAGNOSIS — M1990 Unspecified osteoarthritis, unspecified site: Secondary | ICD-10-CM | POA: Diagnosis present

## 2018-07-12 DIAGNOSIS — Z7901 Long term (current) use of anticoagulants: Secondary | ICD-10-CM

## 2018-07-12 DIAGNOSIS — Z87891 Personal history of nicotine dependence: Secondary | ICD-10-CM

## 2018-07-12 DIAGNOSIS — Z923 Personal history of irradiation: Secondary | ICD-10-CM

## 2018-07-12 DIAGNOSIS — K219 Gastro-esophageal reflux disease without esophagitis: Secondary | ICD-10-CM | POA: Diagnosis present

## 2018-07-12 DIAGNOSIS — M5136 Other intervertebral disc degeneration, lumbar region: Secondary | ICD-10-CM | POA: Diagnosis present

## 2018-07-12 DIAGNOSIS — M8580 Other specified disorders of bone density and structure, unspecified site: Secondary | ICD-10-CM | POA: Diagnosis present

## 2018-07-12 DIAGNOSIS — E7800 Pure hypercholesterolemia, unspecified: Secondary | ICD-10-CM | POA: Diagnosis present

## 2018-07-12 DIAGNOSIS — I480 Paroxysmal atrial fibrillation: Secondary | ICD-10-CM | POA: Diagnosis present

## 2018-07-12 DIAGNOSIS — Z853 Personal history of malignant neoplasm of breast: Secondary | ICD-10-CM

## 2018-07-12 LAB — CBC WITH DIFFERENTIAL/PLATELET
Basophils Absolute: 0 10*3/uL (ref 0.0–0.1)
Basophils Relative: 0 %
Eosinophils Absolute: 0.3 10*3/uL (ref 0.0–0.5)
Eosinophils Relative: 4 %
HCT: 37.5 % (ref 36.0–46.0)
Hemoglobin: 10.5 g/dL — ABNORMAL LOW (ref 12.0–15.0)
Lymphocytes Relative: 22 %
Lymphs Abs: 1.8 10*3/uL (ref 0.7–4.0)
MCH: 17.2 pg — ABNORMAL LOW (ref 26.0–34.0)
MCHC: 28 g/dL — ABNORMAL LOW (ref 30.0–36.0)
MCV: 61.5 fL — ABNORMAL LOW (ref 80.0–100.0)
Monocytes Absolute: 0.2 10*3/uL (ref 0.1–1.0)
Monocytes Relative: 3 %
Neutro Abs: 5.8 10*3/uL (ref 1.7–7.7)
Neutrophils Relative %: 71 %
Platelets: 210 10*3/uL (ref 150–400)
RBC: 6.1 MIL/uL — ABNORMAL HIGH (ref 3.87–5.11)
RDW: 19.9 % — ABNORMAL HIGH (ref 11.5–15.5)
WBC: 8.2 10*3/uL (ref 4.0–10.5)
nRBC: 0 % (ref 0.0–0.2)
nRBC: 1 /100 WBC — ABNORMAL HIGH

## 2018-07-12 LAB — GLUCOSE, CAPILLARY
Glucose-Capillary: 108 mg/dL — ABNORMAL HIGH (ref 70–99)
Glucose-Capillary: 125 mg/dL — ABNORMAL HIGH (ref 70–99)

## 2018-07-12 LAB — COMPREHENSIVE METABOLIC PANEL
ALT: 55 U/L — ABNORMAL HIGH (ref 0–44)
AST: 38 U/L (ref 15–41)
Albumin: 3.8 g/dL (ref 3.5–5.0)
Alkaline Phosphatase: 94 U/L (ref 38–126)
Anion gap: 10 (ref 5–15)
BUN: 16 mg/dL (ref 8–23)
CO2: 24 mmol/L (ref 22–32)
Calcium: 9.3 mg/dL (ref 8.9–10.3)
Chloride: 104 mmol/L (ref 98–111)
Creatinine, Ser: 1.23 mg/dL — ABNORMAL HIGH (ref 0.44–1.00)
GFR calc Af Amer: 50 mL/min — ABNORMAL LOW (ref >60)
GFR calc non Af Amer: 43 mL/min — ABNORMAL LOW (ref >60)
Glucose, Bld: 173 mg/dL — ABNORMAL HIGH (ref 70–99)
Potassium: 4.2 mmol/L (ref 3.5–5.1)
Sodium: 138 mmol/L (ref 135–145)
Total Bilirubin: 0.7 mg/dL (ref 0.3–1.2)
Total Protein: 7 g/dL (ref 6.5–8.1)

## 2018-07-12 LAB — BASIC METABOLIC PANEL
Anion gap: 10 (ref 5–15)
BUN: 16 mg/dL (ref 8–23)
CO2: 25 mmol/L (ref 22–32)
Calcium: 9.4 mg/dL (ref 8.9–10.3)
Chloride: 103 mmol/L (ref 98–111)
Creatinine, Ser: 1.32 mg/dL — ABNORMAL HIGH (ref 0.44–1.00)
GFR calc Af Amer: 45 mL/min — ABNORMAL LOW (ref >60)
GFR calc non Af Amer: 39 mL/min — ABNORMAL LOW (ref >60)
Glucose, Bld: 184 mg/dL — ABNORMAL HIGH (ref 70–99)
Potassium: 4.3 mmol/L (ref 3.5–5.1)
Sodium: 138 mmol/L (ref 135–145)

## 2018-07-12 LAB — PROTIME-INR
INR: 3.88
Prothrombin Time: 37.4 seconds — ABNORMAL HIGH (ref 11.4–15.2)

## 2018-07-12 LAB — TROPONIN I
Troponin I: <0.03 ng/mL (ref <0.03)
Troponin I: <0.03 ng/mL (ref <0.03)

## 2018-07-12 LAB — TSH: TSH: 1.869 u[IU]/mL (ref 0.350–4.500)

## 2018-07-12 MED ORDER — PANTOPRAZOLE SODIUM 40 MG PO TBEC
40.0000 mg | DELAYED_RELEASE_TABLET | Freq: Every day | ORAL | Status: DC
  Administered 2018-07-12 – 2018-07-14 (×3): 40 mg via ORAL
  Filled 2018-07-12 (×3): qty 1

## 2018-07-12 MED ORDER — DILTIAZEM HCL-DEXTROSE 100-5 MG/100ML-% IV SOLN (PREMIX)
10.0000 mg/h | INTRAVENOUS | Status: DC
  Administered 2018-07-12 – 2018-07-14 (×5): 10 mg/h via INTRAVENOUS
  Filled 2018-07-12 (×5): qty 100

## 2018-07-12 MED ORDER — DILTIAZEM LOAD VIA INFUSION
20.0000 mg | Freq: Once | INTRAVENOUS | Status: AC
  Administered 2018-07-12: 20 mg via INTRAVENOUS
  Filled 2018-07-12: qty 20

## 2018-07-12 MED ORDER — WARFARIN SODIUM 5 MG PO TABS: 5.0000 mg | ORAL_TABLET | ORAL | Status: DC

## 2018-07-12 MED ORDER — ONDANSETRON HCL 4 MG/2ML IJ SOLN: 4.0000 mg | Freq: Four times a day (QID) | INTRAMUSCULAR | Status: DC | PRN

## 2018-07-12 MED ORDER — FLECAINIDE ACETATE 50 MG PO TABS
50.0000 mg | ORAL_TABLET | Freq: Two times a day (BID) | ORAL | Status: DC
  Administered 2018-07-13: 50 mg via ORAL
  Filled 2018-07-12: qty 1

## 2018-07-12 MED ORDER — METOPROLOL TARTRATE 25 MG PO TABS
25.0000 mg | ORAL_TABLET | Freq: Two times a day (BID) | ORAL | Status: DC
  Administered 2018-07-13 – 2018-07-14 (×4): 25 mg via ORAL
  Filled 2018-07-12 (×4): qty 1

## 2018-07-12 MED ORDER — FLECAINIDE ACETATE 100 MG PO TABS
300.0000 mg | ORAL_TABLET | Freq: Once | ORAL | Status: AC
  Administered 2018-07-12: 300 mg via ORAL
  Filled 2018-07-12: qty 3

## 2018-07-12 MED ORDER — INSULIN ASPART 100 UNIT/ML ~~LOC~~ SOLN
0.0000 [IU] | Freq: Three times a day (TID) | SUBCUTANEOUS | Status: DC
  Administered 2018-07-13: 3 [IU] via SUBCUTANEOUS
  Administered 2018-07-13 – 2018-07-14 (×2): 2 [IU] via SUBCUTANEOUS

## 2018-07-12 MED ORDER — ACETAMINOPHEN 500 MG PO TABS: 500.0000 mg | ORAL_TABLET | Freq: Four times a day (QID) | ORAL | Status: DC | PRN

## 2018-07-12 MED ORDER — ACETAMINOPHEN 325 MG PO TABS
650.0000 mg | ORAL_TABLET | ORAL | Status: DC | PRN
  Administered 2018-07-13: 650 mg via ORAL
  Filled 2018-07-12: qty 2

## 2018-07-12 MED ORDER — WARFARIN - PHARMACIST DOSING INPATIENT
Freq: Every day | Status: DC
  Administered 2018-07-13: 17:00:00

## 2018-07-12 MED ORDER — METOPROLOL TARTRATE 5 MG/5ML IV SOLN
2.5000 mg | INTRAVENOUS | Status: AC
  Administered 2018-07-12: 2.5 mg via INTRAVENOUS
  Filled 2018-07-12: qty 5

## 2018-07-12 NOTE — Consult Note (Deleted)
History and Physical:   Patient ID: Jenna Mcdonald; [id number]; [birth year]   Admit date: 07/12/2018 Date of Consult: 07/12/2018  Primary Care Provider: Josetta Huddle, MD Primary Cardiologist: Jenna Him, MD  Patient Profile:   Jenna Mcdonald is a 72 y.o. female with a hx of persistent atrial fibrillation on chronic Coumadinfollowed with PCP for INR checks,OSA on BiPAP, HTN, mild AS by echo 1/2018and morbid obesitywho is being seen today for the evaluation of symptomatic atrial fibrillation at the request of Dr. Billy Mcdonald.   History of Present Illness:   Jenna Mcdonald is a 72 year old female with a history stated above who presented to [facility] on 07/12/2018 with complaints of " feeling poorly and fatigued" which began on Saturday morning and continued through today.  Patient states she was on her way to her PCP to have her INR checked posthospitalization when she asked her friend to take her to the emergency room secondary to the above. EKG performed on presentation revealed she was back in atrial fibrillation with HR 96, although per telemetry review her heart rate has been quite variable up to the 140s.  She denies chest pain, palpitations, LE swelling, recent illness including fever, chills, nausea or vomiting, dizziness or syncopal episodes.  She reports on hospital discharge, she was placed on diltiazem for recurrent atrial fibrillation (s/p DCCV to NSR 07/07/18) 4 times daily and she has been unable to keep up with this regimen and has missed several doses. Per chart review, she was placed on the schedule secondary to higher cost of 24H dosing. In speaking with her today, she appears unaware that this was ever an issue and is open to taking a 24H dose to keep her better controlled. .  Of note, patient was recently discharged on 07/08/2018 after hospitalization which began 07/02/2018 for shortness of breath, dry cough and nasal congestion. She initially presented to [facility] with atwo day  history of overwhelming weakness and fatigue. One day prior to admission, she reported feeling dizzy and began experiencing lightnheadedness with change in position as well asexertional dyspnea and intermittent chest pressure without radiation.  She was found to be in atrial fibrillation with RVRwith a HR in the 140's sustained. CXR performed secondary to recent c/o of URI symptoms which showed bronchitic changes but no acute cardiopulmonary findings. She was placed on a diltiazem gtt with bolus for rate control with HR responseto the 90-100's. INR found to be therapeutic at 3.19. On 07/05/2018, her hemoglobin dropped to 9.2.  She also had a positive guaiac stool.  GI was consulted and patient was seen by Jenna Mcdonald.  She underwent EGD and colonoscopy on 07/06/2018.  EGD showed multiple nonbleeding duodenal ulcer, biopsy was taken out to rule out H. pylori.  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  Patient eventually underwent TEE cardioversion on 07/07/2018 with successful conversion to sinus rhythm.  During this hospitalization, she also complained of occasional chest discomfort.  After discussing with her primary cardiologist, we decided to order outpatient coronary CT for further ischemic evaluation.  She also had signs of mild volume overload on day of discharge and was therefore discharged on 20 mg daily of Lasix with plans for close follow up which was scheduled for 07/16/18. She was to have her INR rechecked by her PCP today.  At discharge, she was placed on diltiazem 90 mg QID secondary to cost and  metoprolol 25 mg twice daily.    Given the above, cardiology  has been asked to evaluate.  Past Medical History:  Diagnosis Date  . Anemia   . Aortic stenosis    mild by echo 08/2016  . Breast CA ([place])    left  . Carotid stenosis    1-39% left  . Carotid stenosis   . Degenerative disc disease, lumbar    of the knees  . Diabetes  mellitus   . 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[place])   . Fatigue    Severe secondary to to gemfibrozil  . Gastroesophageal reflux disease    denies  . Hammertoe    left second toe  . Hypercholesteremia   . Hypertension   . Insomnia   . Irritable bowel   . Low back pain   . Metabolic syndrome   . Obesity   . OSA on CPAP \   bipap  . Osteoarthritis   . Osteopenia   . Persistent atrial fibrillation   . Personal history of radiation therapy   . Pneumonia   . PONV (postoperative nausea and vomiting)   . Sigmoid diverticulosis   . Thalassemia minor   . Vitamin D deficiency     Past Surgical History:  Procedure Laterality Date  . BIOPSY  07/06/2018   Procedure: BIOPSY;  Surgeon: Clarene Essex, MD;  Location: [place];  Service: Endoscopy;;  . BREAST LUMPECTOMY Left 05/28/2009  . BREAST SURGERY Left 10   lumpectomy  . CARDIOVERSION N/A 07/07/2018   Procedure: CARDIOVERSION;  Surgeon: [name], MD;  Location: [place];  Service: Cardiovascular;  Laterality: N/A;  . CESAREAN SECTION    . COLONOSCOPY WITH PROPOFOL N/A 07/06/2018   Procedure: COLONOSCOPY WITH PROPOFOL;  Surgeon: Clarene Essex, MD;  Location: [place];  Service: Endoscopy;  Laterality: N/A;  . CYSTECTOMY  70   pilonidal   . DILATION AND CURETTAGE OF UTERUS    . ESOPHAGOGASTRODUODENOSCOPY (EGD) WITH PROPOFOL N/A 07/06/2018   Procedure: ESOPHAGOGASTRODUODENOSCOPY (EGD) WITH PROPOFOL;  Surgeon: Clarene Essex, MD;  Location: [place];  Service: Endoscopy;  Laterality: N/A;  . EYE SURGERY Bilateral 12/14   cataracts  . HOT HEMOSTASIS N/A 07/06/2018   Procedure: HOT HEMOSTASIS (ARGON PLASMA COAGULATION/BICAP);  Surgeon: Clarene Essex, MD;  Location: [place];  Service: Endoscopy;  Laterality: N/A;  . MAXIMUM ACCESS (MAS)POSTERIOR LUMBAR INTERBODY FUSION (PLIF) 2 LEVEL N/A 09/22/2013   Procedure: FOR MAXIMUM ACCESS  POSTERIOR LUMBAR INTERBODY FUSION LUMBAR THREE-FOUR FOUR-FIVE;  Surgeon: Eustace Moore, MD;   Location: [place] [facility];  Service: Neurosurgery;  Laterality: N/A;  . OOPHORECTOMY     wedge section not rem  . 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 REPLACEMENT TOTAL KNEE    . TEE WITHOUT CARDIOVERSION N/A 07/07/2018   Procedure: TRANSESOPHAGEAL ECHOCARDIOGRAM (TEE);  Surgeon: [name], MD;  Location: [place];  Service: Cardiovascular;  Laterality: N/A;  . TONSILLECTOMY    . TUBAL LIGATION       Prior to Admission medications   Medication Sig Start Date End Date Taking? Authorizing Provider  acetaminophen (TYLENOL) 500 MG tablet Take 500 mg by mouth every 6 (six) hours as needed for mild pain. For back pain   Yes [provider]  Biotin 10 MG TABS Take 10 mg by mouth 2 (two) times daily.   Yes [provider]  Calcium Carbonate-Vitamin D (CALCIUM 600 + D PO) Take 1 tablet by mouth daily.    Yes [provider]  diltiazem (CARDIZEM) 90 MG tablet Take 1 tablet (90 mg total) by mouth every 6 (  six) hours. 07/08/18  Yes Almyra Deforest, PA  diphenhydramine-acetaminophen (TYLENOL PM) 25-500 MG TABS tablet Take 1 tablet by mouth at bedtime.   Yes [provider]  furosemide (LASIX) 20 MG tablet Take 1 tablet (20 mg total) by mouth daily. 07/08/18  Yes Almyra Deforest, PA  glimepiride (AMARYL) 4 MG tablet Take 2 mg by mouth daily.  10/29/15  Yes [provider]  metFORMIN (GLUCOPHAGE-XR) 500 MG 24 hr tablet Take 1 tablet by mouth 2 (two) times daily. 10/31/14  Yes [provider]  metoprolol tartrate (LOPRESSOR) 25 MG tablet Take 1 tablet (25 mg total) by mouth 2 (two) times daily. 07/07/18  Yes Almyra Deforest, PA  Multiple Vitamin (MULITIVITAMIN WITH MINERALS) TABS Take 1 tablet by mouth daily.   Yes [provider]  pantoprazole (PROTONIX) 40 MG tablet Take 1 tablet (40 mg total) by mouth daily. 07/08/18  Yes Almyra Deforest, PA  triamcinolone cream (KENALOG) 0.1 % Apply 1 application topically 2 (two) times daily. 06/26/18  Yes [provider]   warfarin (COUMADIN) 5 MG tablet Take 5 mg by mouth See admin instructions. 7.5 mg  Thursday. 5mg  all the other days   Yes [provider]  flecainide (TAMBOCOR) 50 MG tablet Take 1 tablet (50 mg total) by mouth every 12 (twelve) hours. 09/14/11 11/11/11  Jacolyn Reedy, MD    Inpatient Medications: Scheduled Meds:  Continuous Infusions:  PRN Meds:   Allergies:    Allergies  Allergen Reactions  . Aromasin [Exemestane]     Hair loss   . Femara [Letrozole]     unknown  . Gemfibrozil Other (See Comments)    Other reaction(s): Other (See Comments) Unknown Severe fatigue  Other reaction(s): Other (See Comments) Unknown Severe fatigue Unknown  . Hydrocodone-Acetaminophen Nausea And Vomiting    Patient reports terrible nausea; plain tylenol is ok with patient  . Letrozole Other (See Comments)    Unknown reactions   . Metoprolol Diarrhea    Fatigue   . Other Other (See Comments)    Other reaction(s): Other (See Comments) Numerous cancer drugs - unknown reaction Other reaction(s): Other (See Comments) Numerous cancer drugs - unknown reaction Numerous cancer drugs - unknown reaction  . Tamoxifen     Bone and body aches   . Tetracycline Other (See Comments)    Other reaction(s): Other (See Comments) Unknown reaction Other reaction(s): Other (See Comments) Unknown reaction Unknown reaction  . Tetracyclines & Related Other (See Comments)    Vaginal infections    . Toprol Xl [Metoprolol Tartrate]     Hair loss  . Verapamil Other (See Comments)    Other reaction(s): Other (See Comments) Unknown reaction Other reaction(s): Other (See Comments) Unknown reaction Unknown reaction  . Vicodin [Hydrocodone-Acetaminophen] Nausea And Vomiting    Patient reports terrible nausea; plain tylenol is ok with patient    Social History:   Social History   Socioeconomic History  . Marital status: Single    Spouse name: Not on file  . Number of children: Not on file    . Years of education: Not on file  . Highest education level: Not on file  Occupational History  . Not on file  Social Needs  . Financial resource strain: Not on file  . Food insecurity:    Worry: Not on file    Inability: Not on file  . Transportation needs:    Medical: Not on file    Non-medical: Not on file  Tobacco Use  .  Smoking status: Former Smoker    Packs/day: 0.50    Years: 10.00    Pack years: 5.00    Types: Cigarettes    Last attempt to quit: 11/12/2003    Years since quitting: 14.6  . Smokeless tobacco: Never Used  Substance and Sexual Activity  . Alcohol use: Yes    Comment: occasionally  . Drug use: No  . Sexual activity: Never    Birth control/protection: Post-menopausal  Lifestyle  . Physical activity:    Days per week: Not on file    Minutes per session: Not on file  . Stress: Not on file  Relationships  . Social connections:    Talks on phone: Not on file    Gets together: Not on file    Attends religious service: Not on file    Active member of club or organization: Not on file    Attends meetings of clubs or organizations: Not on file    Relationship status: Not on file  . Intimate partner violence:    Fear of current or ex partner: Not on file    Emotionally abused: Not on file    Physically abused: Not on file    Forced sexual activity: Not on file  Other Topics Concern  . Not on file  Social History Narrative  . Not on file    Family History:   Family History  Problem Relation Age of Onset  . Anesthesia problems Mother   . Diabetes Mother   . Diabetes Father    Family Status:  Family Status  Relation Name Status  . Mother angelina Deceased  . Father  Deceased    ROS:  Please see the history of present illness.  All other ROS reviewed and negative.     Physical Exam/Data:   Vitals:   07/12/18 1323 07/12/18 1330 07/12/18 1354 07/12/18 1400  BP: (!) 161/86 (!) 156/76 (!) 174/84 (!) 146/99  Pulse: (!) 47 98 98 (!) 108   Resp: (!) 22 16 (!) 27 20  Temp:      TempSrc:      SpO2: 97% 98% 97% 99%  Weight:      Height:       No intake or output data in the 24 hours ending 07/12/18 1436 Filed Weights   07/12/18 0920  Weight: 115 kg   Body mass index is 42.19 kg/m.   General: Overweight,  NAD Skin: Warm, dry, intact  Head: Normocephalic, atraumatic, clear, moist mucus membranes. Neck: Negative for carotid bruits. No JVD Lungs:Clear to ausculation bilaterally. No wheezes, rales, or rhonchi. Breathing is unlabored. Cardiovascular: Irregularly irregular with S1 S2. + murmurs. No rubs, gallops, or LV heave appreciated. Abdomen: Soft, non-tender, non-distended with normoactive bowel sounds. No obvious abdominal masses. MSK: Strength and tone appear normal for age. 5/5 in all extremities Extremities: No edema. No clubbing or cyanosis. DP/PT pulses 2+ bilaterally Neuro: Alert and oriented. No focal deficits. No facial asymmetry. MAE spontaneously. Psych: Responds to questions appropriately with normal affect.    EKG:  The EKG was personally reviewed and demonstrates: 07/12/2018 atrial fibrillation HR 96 Telemetry:  Telemetry was personally reviewed and demonstrates: 07/12/2018 atrial fibrillation variable heart rate,100-140's   Relevant CV Studies:  ECHO: 07/07/18 LV EF: 55% - 60% Study Conclusions  - Left ventricle: Systolic function was normal. The estimated ejection fraction was in the range of 55% to 60%. No evidence of thrombus. - Aortic valve: No evidence of vegetation. - Mitral valve: No evidence of  vegetation. There was mild regurgitation. - Left atrium: No evidence of thrombus in the atrial cavity or appendage. No evidence of thrombus in the atrial cavity or appendage. No evidence of thrombus in the atrial cavity or appendage. - Right atrium: No evidence of thrombus in the atrial cavity or appendage. - Atrial septum: There was a patent foramen ovale with flow noted by  color flow Doppler. - Tricuspid valve: No evidence of vegetation. - Pulmonic valve: No evidence of vegetation.  Impressions:  - Successful cardioversion. No cardiac source of emboli was indentified.  CATH: None  Laboratory Data:  Chemistry Recent Labs  Lab 07/07/18 0734 07/12/18 1140  NA 139 138  K 4.2 4.2  CL 109 104  CO2 24 24  GLUCOSE 150* 173*  BUN 17 16  CREATININE 1.23* 1.23*  CALCIUM 8.4* 9.3  GFRNONAA 43* 43*  GFRAA 50* 50*  ANIONGAP 6 10    Total Protein  Date Value Ref Range Status  07/12/2018 7.0 6.5 - 8.1 g/dL Final  07/18/2014 7.4 6.4 - 8.3 g/dL Final   Albumin  Date Value Ref Range Status  07/12/2018 3.8 3.5 - 5.0 g/dL Final  07/18/2014 4.1 3.5 - 5.0 g/dL Final   AST  Date Value Ref Range Status  07/12/2018 38 15 - 41 U/L Final  07/18/2014 22 5 - 34 U/L Final   ALT  Date Value Ref Range Status  07/12/2018 55 (H) 0 - 44 U/L Final  07/18/2014 38 0 - 55 U/L Final   Alkaline Phosphatase  Date Value Ref Range Status  07/12/2018 94 38 - 126 U/L Final  07/18/2014 105 40 - 150 U/L Final   Total Bilirubin  Date Value Ref Range Status  07/12/2018 0.7 0.3 - 1.2 mg/dL Final  07/18/2014 0.52 0.20 - 1.20 mg/dL Final   Hematology Recent Labs  Lab 07/06/18 0401 07/07/18 0405 07/12/18 1140  WBC 6.4 8.1 8.2  RBC 5.22* 5.31* 6.10*  HGB 9.0* 9.3* 10.5*  HCT 32.2* 32.9* 37.5  MCV 61.7* 62.0* 61.5*  MCH 17.2* 17.5* 17.2*  MCHC 28.0* 28.3* 28.0*  RDW 19.5* 19.9* 19.9*  PLT 171 179 210   Cardiac EnzymesNo results for input(s): TROPONINI in the last 168 hours. No results for input(s): TROPIPOC in the last 168 hours.  BNPNo results for input(s): BNP, PROBNP in the last 168 hours.  DDimer No results for input(s): DDIMER in the last 168 hours. TSH:  Lab Results  Component Value Date   TSH 1.744 07/02/2018   Lipids: Lab Results  Component Value Date   CHOL  05/14/2008    146        ATP III CLASSIFICATION:  <200     mg/dL   Desirable   200-239  mg/dL   Borderline High  >=240    mg/dL   High   HDL 27 (L) 05/14/2008   LDLCALC  05/14/2008    98        Total Cholesterol/HDL:CHD Risk Coronary Heart Disease Risk Table                     Men   Women  1/2 Average Risk   3.4   3.3   TRIG 105 05/14/2008   CHOLHDL 5.4 05/14/2008   HgbA1c: Lab Results  Component Value Date   HGBA1C 7.1 (H) 07/02/2018   Radiology/Studies:  No results found.  Assessment and Plan:   1.  Atrial fibrillation with RVR: -Patient recently hospitalized from 07/02/2018 to 07/07/2018 for  complaints of fatigue, found to be in AF with RVR. She was initially placed on diltiazem gtt as well as metoprolol 25 mg twice daily with rate control. She underwent a successful TEE DCCV to NSR on 07/07/2018.She was discharged home on diltiazem 90 mg 4 times daily and has been unable to adhere to this regimen. She reports missing several doses on accident given the complicated schedule. On Saturday, she began feeling more fatigued with similar symptoms to her last hospitalization. She had her friend bring her to the emergency department for further evaluation.  On arrival, her heart rate has been variable ranging from 100-140 bpm he was found to be back in atrial fibrillation. -INR, 3.8 today. She has been therapeutic since discharge -Will add IV diltiazem 20mg  once then 10mg /hr and start Flecainide load dose with 300mg  now, then 50mg  BID starting tomorrow, 07/13/18 -Has a scheduled follow-up appointment on 07/16/2018 in our office -Scheduled for OP coronary CT for ischemic evaluation. If rate control measures fail, will have documented workup and can be considered for antiarrhythmics if needed  2.  History of dyspnea: -Patient reports this is stabilized however she has complaints today of frequent urination secondary to low-dose PO Lasix 20 mg daily and is asking if this can be discontinued. -Patient does not appear to be fluid volume overloaded on exam -Creatinine,  1.23 today with last creatinine on 07/07/2018 1.23  3.  History of chest pain:  -Patient currently denies chest pain or associated ACS symptoms  -Plan to proceed with coronary CT as outpatient -Has scheduled follow-up on 07/16/2017 in the with further plans at that time  4. Anemia:             -EGD and colonoscopy yesterday 03/05/2018 with multiple nonbleeding duodenal ulcer, biopsy taken to rule out h pylori. Colonoscopy showed diverticulosis and multiple colonic angiodysplatic lesion treated with argon plasma -Continue Protonix given chronic anticoagulation   5. HTN: -Elevated, 174/84, 156/76, 161/86  -Will increase metoprolol to 50mg  twice daily   For questions or updates, please contact [facility] Please consult [email or web address] for contact info under Cardiology/STEMI.   SignedKathyrn Drown NP-C [facility] Pager: [phone] 07/12/2018 2:36 PM

## 2018-07-12 NOTE — Progress Notes (Signed)
ANTICOAGULATION CONSULT NOTE - Initial Consult  Pharmacy Consult for Coumadin Indication: atrial fibrillation  Allergies  Allergen Reactions  . Aromasin [Exemestane]     Hair loss   . Femara [Letrozole]     unknown  . Gemfibrozil Other (See Comments)    Other reaction(s): Other (See Comments) Unknown Severe fatigue  Other reaction(s): Other (See Comments) Unknown Severe fatigue Unknown  . Hydrocodone-Acetaminophen Nausea And Vomiting    Patient reports terrible nausea; plain tylenol is ok with patient  . Letrozole Other (See Comments)    Unknown reactions   . Metoprolol Diarrhea    Fatigue   . Other Other (See Comments)    Other reaction(s): Other (See Comments) Numerous cancer drugs - unknown reaction Other reaction(s): Other (See Comments) Numerous cancer drugs - unknown reaction Numerous cancer drugs - unknown reaction  . Tamoxifen     Bone and body aches   . Tetracycline Other (See Comments)    Other reaction(s): Other (See Comments) Unknown reaction Other reaction(s): Other (See Comments) Unknown reaction Unknown reaction  . Tetracyclines & Related Other (See Comments)    Vaginal infections    . Toprol Xl [Metoprolol Tartrate]     Hair loss  . Verapamil Other (See Comments)    Other reaction(s): Other (See Comments) Unknown reaction Other reaction(s): Other (See Comments) Unknown reaction Unknown reaction  . Vicodin [Hydrocodone-Acetaminophen] Nausea And Vomiting    Patient reports terrible nausea; plain tylenol is ok with patient    Patient Measurements: Height: 5\' 5"  (165.1 cm) Weight: 253 lb 8.5 oz (115 kg) IBW/kg (Calculated) : 57  Vital Signs: Temp: 99.1 F (37.3 C) (11/18 0918) Temp Source: Oral (11/18 0918) BP: 146/99 (11/18 1400) Pulse Rate: 108 (11/18 1400)  Labs: Recent Labs    07/12/18 1140  HGB 10.5*  HCT 37.5  PLT 210  LABPROT 37.4*  INR 3.88  CREATININE 1.23*    Estimated Creatinine Clearance: 52.3 mL/min (A) (by C-G  formula based on SCr of 1.23 mg/dL (H)).   Medical History: Past Medical History:  Diagnosis Date  . Anemia   . Aortic stenosis    mild by echo 08/2016  . Breast CA ([place])    left  . Carotid stenosis    1-39% left  . Carotid stenosis   . Degenerative disc disease, lumbar    of the knees  . Diabetes mellitus   . 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[place])   . Fatigue    Severe secondary to to gemfibrozil  . Gastroesophageal reflux disease    denies  . Hammertoe    left second toe  . Hypercholesteremia   . Hypertension   . Insomnia   . Irritable bowel   . Low back pain   . Metabolic syndrome   . Obesity   . OSA on CPAP \   bipap  . Osteoarthritis   . Osteopenia   . Persistent atrial fibrillation   . Personal history of radiation therapy   . Pneumonia   . PONV (postoperative nausea and vomiting)   . Sigmoid diverticulosis   . Thalassemia minor   . Vitamin D deficiency     Assessment: 72 yo F presents with fatigue. On Coumadin 5mg  daily exc 7.5mg  on Thursday PTA for Afib. INR on admit is supratherapeutic at 3.8. Last dose was taken on 11/17. Hgb 10.5, plts wnl.  Goal of Therapy:  INR 2-3 Monitor platelets by anticoagulation protocol: Yes   Plan:  Hold Coumadin tonight Monitor daily INR, CBC, s/s of  bleed  Torryn Hudspeth J 07/12/2018,3:35 PM

## 2018-07-12 NOTE — ED Notes (Signed)
Attempted report x1. 

## 2018-07-12 NOTE — Telephone Encounter (Signed)
Patient is currently in ED being addressed. Await results and assessments.

## 2018-07-12 NOTE — Telephone Encounter (Signed)
New Message           Patient calling today to let Dr. Radford Pax know that she is on the way back to the hospital for A-fib. Pls call and advise.

## 2018-07-12 NOTE — H&P (Signed)
History and Physical:   Patient ID: Jenna Mcdonald; [id number]; 1946-01-31   Admit date: 07/12/2018 Date of Consult: 07/12/2018  Primary Care Provider: Josetta Huddle, MD Primary Cardiologist: Fransico Him, MD  Patient Profile:   Jenna Mcdonald is a 72 y.o. female with a hx of persistent atrial fibrillation on chronic Coumadinfollowed with PCP for INR checks,OSA on BiPAP, HTN, mild AS by echo 1/2018and morbid obesitywho is being seen today for the evaluation of symptomatic atrial fibrillation at the request of Dr. Billy Fischer.   History of Present Illness:   Jenna Mcdonald is a 72 year old female with a history stated above who presented to [facility] on 07/12/2018 with complaints of " feeling poorly and fatigued" which began on Saturday morning and continued through today.  Patient states she was on her way to her PCP to have her INR checked posthospitalization when she asked her friend to take her to the emergency room secondary to the above. EKG performed on presentation revealed she was back in atrial fibrillation with HR 96, although per telemetry review her heart rate has been quite variable up to the 140s.  She denies chest pain, palpitations, LE swelling, recent illness including fever, chills, nausea or vomiting, dizziness or syncopal episodes.  She reports on hospital discharge, she was placed on diltiazem for recurrent atrial fibrillation (s/p DCCV to NSR 07/07/18) 4 times daily and she has been unable to keep up with this regimen and has missed several doses. Per chart review, she was placed on the schedule secondary to higher cost of 24H dosing. In speaking with her today, she appears unaware that this was ever an issue and is open to taking a 24H dose to keep her better controlled. .  Of note, patient was recently discharged on 07/08/2018 after hospitalization which began 07/02/2018 for shortness of breath, dry cough and nasal congestion. She initially presented to [facility] with atwo  day history of overwhelming weakness and fatigue. One day prior to admission, she reported feeling dizzy and began experiencing lightnheadedness with change in position as well asexertional dyspnea and intermittent chest pressure without radiation.  She was found to be in atrial fibrillation with RVRwith a HR in the 140's sustained. CXR performed secondary to recent c/o of URI symptoms which showed bronchitic changes but no acute cardiopulmonary findings. She was placed on a diltiazem gtt with bolus for rate control with HR responseto the 90-100's. INR found to be therapeutic at 3.19. On 07/05/2018, her hemoglobin dropped to 9.2.  She also had a positive guaiac stool.  GI was consulted and patient was seen by Dr. Watt Climes.  She underwent EGD and colonoscopy on 07/06/2018.  EGD showed multiple nonbleeding duodenal ulcer, biopsy was taken out to rule out H. pylori.  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  Patient eventually underwent TEE cardioversion on 07/07/2018 with successful conversion to sinus rhythm.  During this hospitalization, she also complained of occasional chest discomfort.  After discussing with her primary cardiologist, we decided to order outpatient coronary CT for further ischemic evaluation.  She also had signs of mild volume overload on day of discharge and was therefore discharged on 20 mg daily of Lasix with plans for close follow up which was scheduled for 07/16/18. She was to have her INR rechecked by her PCP today.  At discharge, she was placed on diltiazem 90 mg QID secondary to cost and  metoprolol 25 mg twice daily.    Given the above,  cardiology has been asked to evaluate.  Past Medical History:  Diagnosis Date  . Anemia   . Aortic stenosis    mild by echo 08/2016  . Breast CA ([place])    left  . Carotid stenosis    1-39% left  . Carotid stenosis   . Degenerative disc disease, lumbar    of the knees  . Diabetes  mellitus   . 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[place])   . Fatigue    Severe secondary to to gemfibrozil  . Gastroesophageal reflux disease    denies  . Hammertoe    left second toe  . Hypercholesteremia   . Hypertension   . Insomnia   . Irritable bowel   . Low back pain   . Metabolic syndrome   . Obesity   . OSA on CPAP \   bipap  . Osteoarthritis   . Osteopenia   . Persistent atrial fibrillation   . Personal history of radiation therapy   . Pneumonia   . PONV (postoperative nausea and vomiting)   . Sigmoid diverticulosis   . Thalassemia minor   . Vitamin D deficiency     Past Surgical History:  Procedure Laterality Date  . BIOPSY  07/06/2018   Procedure: BIOPSY;  Surgeon: Clarene Essex, MD;  Location: [place];  Service: Endoscopy;;  . BREAST LUMPECTOMY Left 05/28/2009  . BREAST SURGERY Left 10   lumpectomy  . CARDIOVERSION N/A 07/07/2018   Procedure: CARDIOVERSION;  Surgeon: [name], MD;  Location: [place];  Service: Cardiovascular;  Laterality: N/A;  . CESAREAN SECTION    . COLONOSCOPY WITH PROPOFOL N/A 07/06/2018   Procedure: COLONOSCOPY WITH PROPOFOL;  Surgeon: Clarene Essex, MD;  Location: [place];  Service: Endoscopy;  Laterality: N/A;  . CYSTECTOMY  70   pilonidal   . DILATION AND CURETTAGE OF UTERUS    . ESOPHAGOGASTRODUODENOSCOPY (EGD) WITH PROPOFOL N/A 07/06/2018   Procedure: ESOPHAGOGASTRODUODENOSCOPY (EGD) WITH PROPOFOL;  Surgeon: Clarene Essex, MD;  Location: [place];  Service: Endoscopy;  Laterality: N/A;  . EYE SURGERY Bilateral 12/14   cataracts  . HOT HEMOSTASIS N/A 07/06/2018   Procedure: HOT HEMOSTASIS (ARGON PLASMA COAGULATION/BICAP);  Surgeon: Clarene Essex, MD;  Location: [place];  Service: Endoscopy;  Laterality: N/A;  . MAXIMUM ACCESS (MAS)POSTERIOR LUMBAR INTERBODY FUSION (PLIF) 2 LEVEL N/A 09/22/2013   Procedure: FOR MAXIMUM ACCESS  POSTERIOR LUMBAR INTERBODY FUSION LUMBAR THREE-FOUR FOUR-FIVE;  Surgeon: Eustace Moore, MD;   Location: [place] [facility];  Service: Neurosurgery;  Laterality: N/A;  . OOPHORECTOMY     wedge section not rem  . 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 REPLACEMENT TOTAL KNEE    . TEE WITHOUT CARDIOVERSION N/A 07/07/2018   Procedure: TRANSESOPHAGEAL ECHOCARDIOGRAM (TEE);  Surgeon: [name], MD;  Location: [place];  Service: Cardiovascular;  Laterality: N/A;  . TONSILLECTOMY    . TUBAL LIGATION       Prior to Admission medications   Medication Sig Start Date End Date Taking? Authorizing Provider  acetaminophen (TYLENOL) 500 MG tablet Take 500 mg by mouth every 6 (six) hours as needed for mild pain. For back pain   Yes [provider]  Biotin 10 MG TABS Take 10 mg by mouth 2 (two) times daily.   Yes [provider]  Calcium Carbonate-Vitamin D (CALCIUM 600 + D PO) Take 1 tablet by mouth daily.    Yes [provider]  diltiazem (CARDIZEM) 90 MG tablet Take 1 tablet (90 mg total) by mouth every  6 (six) hours. 07/08/18  Yes Almyra Deforest, PA  diphenhydramine-acetaminophen (TYLENOL PM) 25-500 MG TABS tablet Take 1 tablet by mouth at bedtime.   Yes [provider]  furosemide (LASIX) 20 MG tablet Take 1 tablet (20 mg total) by mouth daily. 07/08/18  Yes Almyra Deforest, PA  glimepiride (AMARYL) 4 MG tablet Take 2 mg by mouth daily.  10/29/15  Yes [provider]  metFORMIN (GLUCOPHAGE-XR) 500 MG 24 hr tablet Take 1 tablet by mouth 2 (two) times daily. 10/31/14  Yes [provider]  metoprolol tartrate (LOPRESSOR) 25 MG tablet Take 1 tablet (25 mg total) by mouth 2 (two) times daily. 07/07/18  Yes Almyra Deforest, PA  Multiple Vitamin (MULITIVITAMIN WITH MINERALS) TABS Take 1 tablet by mouth daily.   Yes [provider]  pantoprazole (PROTONIX) 40 MG tablet Take 1 tablet (40 mg total) by mouth daily. 07/08/18  Yes Almyra Deforest, PA  triamcinolone cream (KENALOG) 0.1 % Apply 1 application topically 2 (two) times daily. 06/26/18  Yes [provider]   warfarin (COUMADIN) 5 MG tablet Take 5 mg by mouth See admin instructions. 7.5 mg  Thursday. 5mg  all the other days   Yes [provider]  flecainide (TAMBOCOR) 50 MG tablet Take 1 tablet (50 mg total) by mouth every 12 (twelve) hours. 09/14/11 11/11/11  Jacolyn Reedy, MD    Inpatient Medications: Scheduled Meds:  Continuous Infusions:  PRN Meds:   Allergies:    Allergies  Allergen Reactions  . Aromasin [Exemestane]     Hair loss   . Femara [Letrozole]     unknown  . Gemfibrozil Other (See Comments)    Other reaction(s): Other (See Comments) Unknown Severe fatigue  Other reaction(s): Other (See Comments) Unknown Severe fatigue Unknown  . Hydrocodone-Acetaminophen Nausea And Vomiting    Patient reports terrible nausea; plain tylenol is ok with patient  . Letrozole Other (See Comments)    Unknown reactions   . Metoprolol Diarrhea    Fatigue   . Other Other (See Comments)    Other reaction(s): Other (See Comments) Numerous cancer drugs - unknown reaction Other reaction(s): Other (See Comments) Numerous cancer drugs - unknown reaction Numerous cancer drugs - unknown reaction  . Tamoxifen     Bone and body aches   . Tetracycline Other (See Comments)    Other reaction(s): Other (See Comments) Unknown reaction Other reaction(s): Other (See Comments) Unknown reaction Unknown reaction  . Tetracyclines & Related Other (See Comments)    Vaginal infections    . Toprol Xl [Metoprolol Tartrate]     Hair loss  . Verapamil Other (See Comments)    Other reaction(s): Other (See Comments) Unknown reaction Other reaction(s): Other (See Comments) Unknown reaction Unknown reaction  . Vicodin [Hydrocodone-Acetaminophen] Nausea And Vomiting    Patient reports terrible nausea; plain tylenol is ok with patient    Social History:   Social History   Socioeconomic History  . Marital status: Single    Spouse name: Not on file  . Number of children: Not on file    . Years of education: Not on file  . Highest education level: Not on file  Occupational History  . Not on file  Social Needs  . Financial resource strain: Not on file  . Food insecurity:    Worry: Not on file    Inability: Not on file  . Transportation needs:    Medical: Not on file    Non-medical: Not on file  Tobacco Use  .  Smoking status: Former Smoker    Packs/day: 0.50    Years: 10.00    Pack years: 5.00    Types: Cigarettes    Last attempt to quit: 11/12/2003    Years since quitting: 14.6  . Smokeless tobacco: Never Used  Substance and Sexual Activity  . Alcohol use: Yes    Comment: occasionally  . Drug use: No  . Sexual activity: Never    Birth control/protection: Post-menopausal  Lifestyle  . Physical activity:    Days per week: Not on file    Minutes per session: Not on file  . Stress: Not on file  Relationships  . Social connections:    Talks on phone: Not on file    Gets together: Not on file    Attends religious service: Not on file    Active member of club or organization: Not on file    Attends meetings of clubs or organizations: Not on file    Relationship status: Not on file  . Intimate partner violence:    Fear of current or ex partner: Not on file    Emotionally abused: Not on file    Physically abused: Not on file    Forced sexual activity: Not on file  Other Topics Concern  . Not on file  Social History Narrative  . Not on file    Family History:   Family History  Problem Relation Age of Onset  . Anesthesia problems Mother   . Diabetes Mother   . Diabetes Father    Family Status:  Family Status  Relation Name Status  . Mother angelina Deceased  . Father  Deceased    ROS:  Please see the history of present illness.  All other ROS reviewed and negative.     Physical Exam/Data:   Vitals:   07/12/18 1323 07/12/18 1330 07/12/18 1354 07/12/18 1400  BP: (!) 161/86 (!) 156/76 (!) 174/84 (!) 146/99  Pulse: (!) 47 98 98 (!) 108   Resp: (!) 22 16 (!) 27 20  Temp:      TempSrc:      SpO2: 97% 98% 97% 99%  Weight:      Height:       No intake or output data in the 24 hours ending 07/12/18 1436 Filed Weights   07/12/18 0920  Weight: 115 kg   Body mass index is 42.19 kg/m.   General: Overweight,  NAD Skin: Warm, dry, intact  Head: Normocephalic, atraumatic, clear, moist mucus membranes. Neck: Negative for carotid bruits. No JVD Lungs:Clear to ausculation bilaterally. No wheezes, rales, or rhonchi. Breathing is unlabored. Cardiovascular: Irregularly irregular with S1 S2. + murmurs. No rubs, gallops, or LV heave appreciated. Abdomen: Soft, non-tender, non-distended with normoactive bowel sounds. No obvious abdominal masses. MSK: Strength and tone appear normal for age. 5/5 in all extremities Extremities: No edema. No clubbing or cyanosis. DP/PT pulses 2+ bilaterally Neuro: Alert and oriented. No focal deficits. No facial asymmetry. MAE spontaneously. Psych: Responds to questions appropriately with normal affect.    EKG:  The EKG was personally reviewed and demonstrates: 07/12/2018 atrial fibrillation HR 96 Telemetry:  Telemetry was personally reviewed and demonstrates: 07/12/2018 atrial fibrillation variable heart rate,100-140's   Relevant CV Studies:  ECHO: 07/07/18 LV EF: 55% - 60% Study Conclusions  - Left ventricle: Systolic function was normal. The estimated ejection fraction was in the range of 55% to 60%. No evidence of thrombus. - Aortic valve: No evidence of vegetation. - Mitral valve: No evidence of  vegetation. There was mild regurgitation. - Left atrium: No evidence of thrombus in the atrial cavity or appendage. No evidence of thrombus in the atrial cavity or appendage. No evidence of thrombus in the atrial cavity or appendage. - Right atrium: No evidence of thrombus in the atrial cavity or appendage. - Atrial septum: There was a patent foramen ovale with flow noted by  color flow Doppler. - Tricuspid valve: No evidence of vegetation. - Pulmonic valve: No evidence of vegetation.  Impressions:  - Successful cardioversion. No cardiac source of emboli was indentified.  CATH: None  Laboratory Data:  Chemistry Recent Labs  Lab 07/07/18 0734 07/12/18 1140  NA 139 138  K 4.2 4.2  CL 109 104  CO2 24 24  GLUCOSE 150* 173*  BUN 17 16  CREATININE 1.23* 1.23*  CALCIUM 8.4* 9.3  GFRNONAA 43* 43*  GFRAA 50* 50*  ANIONGAP 6 10    Total Protein  Date Value Ref Range Status  07/12/2018 7.0 6.5 - 8.1 g/dL Final  07/18/2014 7.4 6.4 - 8.3 g/dL Final   Albumin  Date Value Ref Range Status  07/12/2018 3.8 3.5 - 5.0 g/dL Final  07/18/2014 4.1 3.5 - 5.0 g/dL Final   AST  Date Value Ref Range Status  07/12/2018 38 15 - 41 U/L Final  07/18/2014 22 5 - 34 U/L Final   ALT  Date Value Ref Range Status  07/12/2018 55 (H) 0 - 44 U/L Final  07/18/2014 38 0 - 55 U/L Final   Alkaline Phosphatase  Date Value Ref Range Status  07/12/2018 94 38 - 126 U/L Final  07/18/2014 105 40 - 150 U/L Final   Total Bilirubin  Date Value Ref Range Status  07/12/2018 0.7 0.3 - 1.2 mg/dL Final  07/18/2014 0.52 0.20 - 1.20 mg/dL Final   Hematology Recent Labs  Lab 07/06/18 0401 07/07/18 0405 07/12/18 1140  WBC 6.4 8.1 8.2  RBC 5.22* 5.31* 6.10*  HGB 9.0* 9.3* 10.5*  HCT 32.2* 32.9* 37.5  MCV 61.7* 62.0* 61.5*  MCH 17.2* 17.5* 17.2*  MCHC 28.0* 28.3* 28.0*  RDW 19.5* 19.9* 19.9*  PLT 171 179 210   Cardiac EnzymesNo results for input(s): TROPONINI in the last 168 hours. No results for input(s): TROPIPOC in the last 168 hours.  BNPNo results for input(s): BNP, PROBNP in the last 168 hours.  DDimer No results for input(s): DDIMER in the last 168 hours. TSH:  Lab Results  Component Value Date   TSH 1.744 07/02/2018   Lipids: Lab Results  Component Value Date   CHOL  05/14/2008    146        ATP III CLASSIFICATION:  <200     mg/dL   Desirable   200-239  mg/dL   Borderline High  >=240    mg/dL   High   HDL 27 (L) 05/14/2008   LDLCALC  05/14/2008    98        Total Cholesterol/HDL:CHD Risk Coronary Heart Disease Risk Table                     Men   Women  1/2 Average Risk   3.4   3.3   TRIG 105 05/14/2008   CHOLHDL 5.4 05/14/2008   HgbA1c: Lab Results  Component Value Date   HGBA1C 7.1 (H) 07/02/2018   Radiology/Studies:  No results found.  Assessment and Plan:   1.  Atrial fibrillation with RVR: -Patient recently hospitalized from 07/02/2018 to 07/07/2018 for  complaints of fatigue, found to be in AF with RVR. She was initially placed on diltiazem gtt as well as metoprolol 25 mg twice daily with rate control. She underwent a successful TEE DCCV to NSR on 07/07/2018.She was discharged home on diltiazem 90 mg 4 times daily and has been unable to adhere to this regimen. She reports missing several doses on accident given the complicated schedule. On Saturday, she began feeling more fatigued with similar symptoms to her last hospitalization. She had her friend bring her to the emergency department for further evaluation.  On arrival, her heart rate has been variable ranging from 100-140 bpm he was found to be back in atrial fibrillation. -INR, 3.8 today. She has been therapeutic since discharge -Will add IV diltiazem 20mg  once then 10mg /hr and start Flecainide load dose with 300mg  now, then 50mg  BID starting tomorrow, 07/13/18 -Has a scheduled follow-up appointment on 07/16/2018 in our office -Scheduled for OP coronary CT for ischemic evaluation. If rate control measures fail, will have documented workup and can be considered for antiarrhythmics if needed  2.  History of dyspnea: -Patient reports this is stabilized however she has complaints today of frequent urination secondary to low-dose PO Lasix 20 mg daily and is asking if this can be discontinued. -Patient does not appear to be fluid volume overloaded on exam -Creatinine,  1.23 today with last creatinine on 07/07/2018 1.23  3.  History of chest pain:  -Patient currently denies chest pain or associated ACS symptoms  -Plan to proceed with coronary CT as outpatient -Has scheduled follow-up on 07/16/2017 in the with further plans at that time  4. Anemia:             -EGD and colonoscopy yesterday 03/05/2018 with multiple nonbleeding duodenal ulcer, biopsy taken to rule out h pylori. Colonoscopy showed diverticulosis and multiple colonic angiodysplatic lesion treated with argon plasma -Continue Protonix given chronic anticoagulation   5. HTN: -Elevated, 174/84, 156/76, 161/86  -Will increase metoprolol to 50mg  twice daily   For questions or updates, please contact [place] Please consult [email or web address] for contact info under Cardiology/STEMI.   SignedKathyrn Drown NP-C [facility] Pager: [phone] 07/12/2018 2:36 PM

## 2018-07-12 NOTE — ED Provider Notes (Signed)
[facility] EMERGENCY DEPARTMENT Provider Note   CSN: [id number] Arrival date & time: 07/12/18  0907     History   Chief Complaint Chief Complaint  Patient presents with  . Atrial Fibrillation    HPI Jenna Mcdonald is a 72 y.o. female.  HPI  Admitted to hospital for atrial fibrillation discharged last Wednesday Sunday, Saturday night, started to feel fatigue, sometimes chest feels tight. This time a little bit tight, not too severe, just didn't feel right HR 133 at home, atrial fibrillation Supposed to have INR checked Left the hospital Wednesday Taking lasix, urinating a lot, but not good at drinking water and worried is dehydrated Shortness of breath with exertion, feeling like not having power to do anything  Past Medical History:  Diagnosis Date  . Anemia   . Aortic stenosis    mild by echo 08/2016  . Breast CA ([place])    left  . Carotid stenosis    1-39% left  . Carotid stenosis   . Degenerative disc disease, lumbar    of the knees  . Diabetes mellitus   . 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[place])   . Fatigue    Severe secondary to to gemfibrozil  . Gastroesophageal reflux disease    denies  . Hammertoe    left second toe  . Hypercholesteremia   . Hypertension   . Insomnia   . Irritable bowel   . Low back pain   . Metabolic syndrome   . Obesity   . OSA on CPAP \   bipap  . Osteoarthritis   . Osteopenia   . Persistent atrial fibrillation   . Personal history of radiation therapy   . Pneumonia   . PONV (postoperative nausea and vomiting)   . Sigmoid diverticulosis   . Thalassemia minor   . Vitamin D deficiency     Patient Active Problem List   Diagnosis Date Noted  . Anemia   . Heme positive stool   . Atrial fibrillation with RVR ([place]) 07/04/2018  . Atrial fibrillation ([place]) 07/02/2018  . Carotid stenosis   . Bruit of left carotid artery 09/22/2017  . Aortic stenosis   . Heart murmur 09/09/2016  . Morbid obesity ([place]) 02/05/2016    . Dry eye 05/17/2015  . Type 2 diabetes mellitus without complication ([place]) 94/76/5465  . Breast cancer, left breast ([place]) 07/22/2014  . Obesity, Class III, BMI 40-49.9 (morbid obesity) ([place]) 07/06/2014  . 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 History of surgical procedure 05/17/2014  . OSA (obstructive sleep apnea) 11/17/2013  . S/P lumbar spinal fusion 09/22/2013  . Persistent atrial fibrillation ([place]) 09/11/2011    Class: Acute  . 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[place]) 09/11/2011    Class: Chronic  . Essential hypertension 09/11/2011  . Exogenous obesity 09/11/2011  . Gastroesophageal reflux disease 09/11/2011  . Thalassemia minor 09/11/2011    Class: Chronic  . Breast cancer ([place]) 09/11/2011    Class: Chronic  . Degenerative joint disease 09/11/2011    Class: Chronic  . Irritable bowel disease 09/11/2011    Class: Chronic  . Lumbar disc disease 09/11/2011    Past Surgical History:  Procedure Laterality Date  . BIOPSY  07/06/2018   Procedure: BIOPSY;  Surgeon: Clarene Essex, MD;  Location: [facility];  Service: Endoscopy;;  . BREAST LUMPECTOMY Left 05/28/2009  . BREAST SURGERY Left 10   lumpectomy  . CARDIOVERSION N/A 07/07/2018   Procedure: CARDIOVERSION;  Surgeon: [name], MD;  Location: [facility];  Service:  Cardiovascular;  Laterality: N/A;  . CESAREAN SECTION    . COLONOSCOPY WITH PROPOFOL N/A 07/06/2018   Procedure: COLONOSCOPY WITH PROPOFOL;  Surgeon: Clarene Essex, MD;  Location: [facility];  Service: Endoscopy;  Laterality: N/A;  . CYSTECTOMY  70   pilonidal   . DILATION AND CURETTAGE OF UTERUS    . ESOPHAGOGASTRODUODENOSCOPY (EGD) WITH PROPOFOL N/A 07/06/2018   Procedure: ESOPHAGOGASTRODUODENOSCOPY (EGD) WITH PROPOFOL;  Surgeon: Clarene Essex, MD;  Location: [facility];  Service: Endoscopy;  Laterality: N/A;  . EYE SURGERY Bilateral 12/14   cataracts  . HOT HEMOSTASIS N/A 07/06/2018   Procedure: HOT HEMOSTASIS (ARGON PLASMA COAGULATION/BICAP);  Surgeon: Clarene Essex, MD;  Location: [facility];  Service: Endoscopy;  Laterality: N/A;  . MAXIMUM ACCESS (MAS)POSTERIOR LUMBAR INTERBODY FUSION (PLIF) 2 LEVEL N/A 09/22/2013   Procedure: FOR MAXIMUM ACCESS  POSTERIOR LUMBAR INTERBODY FUSION LUMBAR THREE-FOUR FOUR-FIVE;  Surgeon: Eustace Moore, MD;  Location: [facility];  Service: Neurosurgery;  Laterality: N/A;  . OOPHORECTOMY     wedge section not rem  . 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 REPLACEMENT TOTAL KNEE    . TEE WITHOUT CARDIOVERSION N/A 07/07/2018   Procedure: TRANSESOPHAGEAL ECHOCARDIOGRAM (TEE);  Surgeon: [name], MD;  Location: [facility];  Service: Cardiovascular;  Laterality: N/A;  . TONSILLECTOMY    . TUBAL LIGATION       OB History   None      Home Medications    Prior to Admission medications   Medication Sig Start Date End Date Taking? Authorizing Provider  acetaminophen (TYLENOL) 500 MG tablet Take 500 mg by mouth every 6 (six) hours as needed for mild pain. For back pain   Yes [provider]  Biotin 10 MG TABS Take 10 mg by mouth 2 (two) times daily.   Yes [provider]  Calcium Carbonate-Vitamin D (CALCIUM 600 + D PO) Take 1 tablet by mouth daily.    Yes [provider]  diltiazem (CARDIZEM) 90 MG tablet Take 1 tablet (90 mg total) by mouth every 6 (six) hours. 07/08/18  Yes Almyra Deforest, PA  diphenhydramine-acetaminophen (TYLENOL PM) 25-500 MG TABS tablet Take 1 tablet by mouth at bedtime.   Yes [provider]  furosemide (LASIX) 20 MG tablet Take 1 tablet (20 mg total) by mouth daily. 07/08/18  Yes Almyra Deforest, PA  glimepiride (AMARYL) 4 MG tablet Take 2 mg by mouth daily.  10/29/15  Yes [provider]  metFORMIN (GLUCOPHAGE-XR) 500 MG 24 hr tablet Take 1 tablet by mouth 2 (two) times daily. 10/31/14  Yes [provider]  metoprolol tartrate (LOPRESSOR) 25 MG tablet Take 1 tablet (25 mg total) by mouth 2 (two) times daily. 07/07/18  Yes Almyra Deforest, PA  Multiple  Vitamin (MULITIVITAMIN WITH MINERALS) TABS Take 1 tablet by mouth daily.   Yes [provider]  pantoprazole (PROTONIX) 40 MG tablet Take 1 tablet (40 mg total) by mouth daily. 07/08/18  Yes Almyra Deforest, PA  triamcinolone cream (KENALOG) 0.1 % Apply 1 application topically 2 (two) times daily. 06/26/18  Yes [provider]  warfarin (COUMADIN) 5 MG tablet Take 5 mg by mouth See admin instructions. 7.5 mg  Thursday. 5mg  all the other days   Yes [provider]  flecainide (TAMBOCOR) 50 MG tablet Take 1 tablet (50 mg total) by mouth every 12 (twelve) hours. 09/14/11 11/11/11  Jacolyn Reedy, MD    Family History Family History  Problem Relation Age of Onset  .  Anesthesia problems Mother   . Diabetes Mother   . Diabetes Father     Social History Social History   Tobacco Use  . Smoking status: Former Smoker    Packs/day: 0.50    Years: 10.00    Pack years: 5.00    Types: Cigarettes    Last attempt to quit: 11/12/2003    Years since quitting: 14.6  . Smokeless tobacco: Never Used  Substance Use Topics  . Alcohol use: Yes    Comment: occasionally  . Drug use: No     Allergies   Aromasin [exemestane]; Femara [letrozole]; Gemfibrozil; Hydrocodone-acetaminophen; Letrozole; Metoprolol; Other; Tamoxifen; Tetracycline; Tetracyclines & related; Toprol xl [metoprolol tartrate]; Verapamil; and Vicodin [hydrocodone-acetaminophen]   Review of Systems Review of Systems  Constitutional: Positive for fatigue.  HENT: Positive for rhinorrhea. Negative for congestion.   Respiratory: Positive for cough (one month) and shortness of breath.   Cardiovascular: Positive for chest pain and palpitations.  Gastrointestinal: Negative for diarrhea, nausea and vomiting.  Genitourinary: Negative for dysuria.  Neurological: Negative for light-headedness.     Physical Exam Updated Vital Signs BP 123/67 (BP Location: Right Arm)   Pulse (!) 55   Temp 98.3 F (36.8 C) (Oral)    Resp 20   Ht 5\' 5"  (1.651 m)   Wt 108.4 kg Comment: scale c  SpO2 96%   BMI 39.76 kg/m   Physical Exam  Constitutional: She is oriented to person, place, and time. She appears well-developed and well-nourished. No distress.  HENT:  Head: Normocephalic and atraumatic.  Eyes: Conjunctivae and EOM are normal.  Neck: Normal range of motion.  Cardiovascular: Normal rate, normal heart sounds and intact distal pulses. An irregularly irregular rhythm present. Exam reveals no gallop and no friction rub.  No murmur heard. Pulmonary/Chest: Effort normal and breath sounds normal. No respiratory distress. She has no wheezes. She has no rales.  Abdominal: Soft. She exhibits no distension. There is no tenderness. There is no guarding.  Musculoskeletal: She exhibits no edema or tenderness.  Neurological: She is alert and oriented to person, place, and time.  Skin: Skin is warm and dry. No rash noted. She is not diaphoretic. No erythema.  Nursing note and vitals reviewed.    ED Treatments / Results  Labs (all labs ordered are listed, but only abnormal results are displayed) Labs Reviewed  CBC WITH DIFFERENTIAL/PLATELET - Abnormal; Notable for the following components:      Result Value   RBC 6.10 (*)    Hemoglobin 10.5 (*)    MCV 61.5 (*)    MCH 17.2 (*)    MCHC 28.0 (*)    RDW 19.9 (*)    nRBC 1 (*)    All other components within normal limits  COMPREHENSIVE METABOLIC PANEL - Abnormal; Notable for the following components:   Glucose, Bld 173 (*)    Creatinine, Ser 1.23 (*)    ALT 55 (*)    GFR calc non Af Amer 43 (*)    GFR calc Af Amer 50 (*)    All other components within normal limits  PROTIME-INR - Abnormal; Notable for the following components:   Prothrombin Time 37.4 (*)    All other components within normal limits  BASIC METABOLIC PANEL - Abnormal; Notable for the following components:   Glucose, Bld 184 (*)    Creatinine, Ser 1.32 (*)    GFR calc non Af Amer 39 (*)    GFR  calc Af Amer 45 (*)  All other components within normal limits  BASIC METABOLIC PANEL - Abnormal; Notable for the following components:   CO2 21 (*)    Glucose, Bld 153 (*)    BUN 26 (*)    Creatinine, Ser 1.62 (*)    GFR calc non Af Amer 31 (*)    GFR calc Af Amer 36 (*)    All other components within normal limits  CBC - Abnormal; Notable for the following components:   RBC 5.77 (*)    Hemoglobin 9.9 (*)    HCT 34.6 (*)    MCV 60.0 (*)    MCH 17.2 (*)    MCHC 28.6 (*)    RDW 19.1 (*)    All other components within normal limits  PROTIME-INR - Abnormal; Notable for the following components:   Prothrombin Time 29.8 (*)    All other components within normal limits  GLUCOSE, CAPILLARY - Abnormal; Notable for the following components:   Glucose-Capillary 108 (*)    All other components within normal limits  GLUCOSE, CAPILLARY - Abnormal; Notable for the following components:   Glucose-Capillary 125 (*)    All other components within normal limits  TSH  TROPONIN I  TROPONIN I  TROPONIN I  HEMOGLOBIN A1C    EKG EKG Interpretation  Date/Time:  Monday July 12 2018 09:17:40 EST Ventricular Rate:  96 PR Interval:    QRS Duration: 84 QT Interval:  327 QTC Calculation: 414 R Axis:   81 Text Interpretation:  Atrial fibrillation Borderline right axis deviation Borderline repolarization abnormality Baseline wander in lead(s) V5 V6 No significant change since last tracing Confirmed by Gareth Morgan [phone]) on 07/12/2018 2:18:56 PM   Radiology No results found.  Procedures Procedures (including critical care time)  Medications Ordered in ED Medications  pantoprazole (PROTONIX) EC tablet 40 mg (40 mg Oral Given 07/12/18 1853)  acetaminophen (TYLENOL) tablet 650 mg (650 mg Oral Given 07/13/18 0142)  ondansetron (ZOFRAN) injection 4 mg (has no administration in time range)  metoprolol tartrate (LOPRESSOR) injection 2.5 mg (2.5 mg Intravenous Given 07/12/18 1632)    metoprolol tartrate (LOPRESSOR) tablet 25 mg (25 mg Oral Given 07/13/18 0110)  diltiazem (CARDIZEM) 100 mg in dextrose 5% 158mL (1 mg/mL) infusion (10 mg/hr Intravenous New Bag/Given 07/13/18 0059)  flecainide (TAMBOCOR) tablet 50 mg (has no administration in time range)  insulin aspart (novoLOG) injection 0-15 Units (0 Units Subcutaneous Not Given 07/12/18 1843)  Warfarin - Pharmacist Dosing Inpatient (0 each Does not apply Hold 07/12/18 1800)  diltiazem (CARDIZEM) 1 mg/mL load via infusion 20 mg (20 mg Intravenous Bolus from Bag 07/12/18 1659)  flecainide (TAMBOCOR) tablet 300 mg (300 mg Oral Given 07/12/18 1700)     Initial Impression / Assessment and Plan / ED Course  I have reviewed the triage vital signs and the nursing notes.  Pertinent labs & imaging results that were available during my care of the patient were reviewed by me and considered in my medical decision making (see chart for details).     72 year old female with history above, including recent admission for atrial fibrillation presents with concern for fatigue, palpitations, chest tightness.  Patient reports to the symptoms she is had with her atrial fibrillation, and notes that her heart monitor also showed she is out of rhythm with fast heart rate.  On arrival to the emergency department, patient is hemodynamically stable with a heart rate in the 80s, heart rate in atrial fibrillation.  Has hx of atrial fibrillation, is not hypoxic  or tachypneic, no asymmetric leg swelling, and have low suspicion for PE eat this time.  Cardiology consulted with concern for symptomatic atrial fibrillation, rate labile with any activity.    Cardiology to admit. Rate more elevated on Cardiology exam, will start dilt and flecainide.    Final Clinical Impressions(s) / ED Diagnoses   Final diagnoses:  Persistent atrial fibrillation    ED Discharge Orders    None       Gareth Morgan, MD 07/13/18 [phone]

## 2018-07-12 NOTE — Progress Notes (Signed)
Received patient to the unit.  CCMD called.  Patient is currently ordering diner.   07/12/18 1749  Vitals  Temp 98.3 F (36.8 C)  Temp Source Oral  BP (!) 144/72  MAP (mmHg) 94  BP Location Right Arm  BP Method Automatic  Patient Position (if appropriate) Lying  Pulse Rate 93  Pulse Rate Source Monitor  Resp 18  Oxygen Therapy  SpO2 97 %  O2 Device Room Air

## 2018-07-12 NOTE — ED Triage Notes (Signed)
Pt from home with c/o chest tightness.  Pt has a history of A-Fib and states she has a monitor at home that found her rhythm to be A-Fib.

## 2018-07-13 ENCOUNTER — Encounter (HOSPITAL_COMMUNITY): Payer: Self-pay | Admitting: General Practice

## 2018-07-13 DIAGNOSIS — I350 Nonrheumatic aortic (valve) stenosis: Secondary | ICD-10-CM | POA: Diagnosis present

## 2018-07-13 DIAGNOSIS — M1990 Unspecified osteoarthritis, unspecified site: Secondary | ICD-10-CM | POA: Diagnosis present

## 2018-07-13 DIAGNOSIS — E7800 Pure hypercholesterolemia, unspecified: Secondary | ICD-10-CM | POA: Diagnosis present

## 2018-07-13 DIAGNOSIS — Z885 Allergy status to narcotic agent status: Secondary | ICD-10-CM | POA: Diagnosis not present

## 2018-07-13 DIAGNOSIS — Z6839 Body mass index (BMI) 39.0-39.9, adult: Secondary | ICD-10-CM | POA: Diagnosis not present

## 2018-07-13 DIAGNOSIS — I4892 Unspecified atrial flutter: Secondary | ICD-10-CM | POA: Diagnosis not present

## 2018-07-13 DIAGNOSIS — M8580 Other specified disorders of bone density and structure, unspecified site: Secondary | ICD-10-CM | POA: Diagnosis present

## 2018-07-13 DIAGNOSIS — M5136 Other intervertebral disc degeneration, lumbar region: Secondary | ICD-10-CM | POA: Diagnosis present

## 2018-07-13 DIAGNOSIS — E119 Type 2 diabetes mellitus without complications: Secondary | ICD-10-CM

## 2018-07-13 DIAGNOSIS — G4733 Obstructive sleep apnea (adult) (pediatric): Secondary | ICD-10-CM | POA: Diagnosis present

## 2018-07-13 DIAGNOSIS — D509 Iron deficiency anemia, unspecified: Secondary | ICD-10-CM

## 2018-07-13 DIAGNOSIS — Z833 Family history of diabetes mellitus: Secondary | ICD-10-CM | POA: Diagnosis not present

## 2018-07-13 DIAGNOSIS — I4819 Other persistent atrial fibrillation: Secondary | ICD-10-CM | POA: Diagnosis present

## 2018-07-13 DIAGNOSIS — Z87891 Personal history of nicotine dependence: Secondary | ICD-10-CM | POA: Diagnosis not present

## 2018-07-13 DIAGNOSIS — Z888 Allergy status to other drugs, medicaments and biological substances status: Secondary | ICD-10-CM | POA: Diagnosis not present

## 2018-07-13 DIAGNOSIS — Z881 Allergy status to other antibiotic agents status: Secondary | ICD-10-CM | POA: Diagnosis not present

## 2018-07-13 DIAGNOSIS — K5730 Diverticulosis of large intestine without perforation or abscess without bleeding: Secondary | ICD-10-CM | POA: Diagnosis present

## 2018-07-13 DIAGNOSIS — I10 Essential (primary) hypertension: Secondary | ICD-10-CM | POA: Diagnosis present

## 2018-07-13 DIAGNOSIS — K219 Gastro-esophageal reflux disease without esophagitis: Secondary | ICD-10-CM | POA: Diagnosis present

## 2018-07-13 DIAGNOSIS — Z96652 Presence of left artificial knee joint: Secondary | ICD-10-CM | POA: Diagnosis present

## 2018-07-13 DIAGNOSIS — Z853 Personal history of malignant neoplasm of breast: Secondary | ICD-10-CM | POA: Diagnosis not present

## 2018-07-13 DIAGNOSIS — Z923 Personal history of irradiation: Secondary | ICD-10-CM | POA: Diagnosis not present

## 2018-07-13 DIAGNOSIS — Z7901 Long term (current) use of anticoagulants: Secondary | ICD-10-CM | POA: Diagnosis not present

## 2018-07-13 DIAGNOSIS — Z7984 Long term (current) use of oral hypoglycemic drugs: Secondary | ICD-10-CM | POA: Diagnosis not present

## 2018-07-13 DIAGNOSIS — I4891 Unspecified atrial fibrillation: Secondary | ICD-10-CM | POA: Diagnosis present

## 2018-07-13 LAB — GLUCOSE, CAPILLARY
Glucose-Capillary: 176 mg/dL — ABNORMAL HIGH (ref 70–99)
Glucose-Capillary: 136 mg/dL — ABNORMAL HIGH (ref 70–99)
Glucose-Capillary: 117 mg/dL — ABNORMAL HIGH (ref 70–99)
Glucose-Capillary: 176 mg/dL — ABNORMAL HIGH (ref 70–99)

## 2018-07-13 LAB — BASIC METABOLIC PANEL
Anion gap: 12 (ref 5–15)
BUN: 26 mg/dL — ABNORMAL HIGH (ref 8–23)
CO2: 21 mmol/L — ABNORMAL LOW (ref 22–32)
Calcium: 9 mg/dL (ref 8.9–10.3)
Chloride: 104 mmol/L (ref 98–111)
Creatinine, Ser: 1.62 mg/dL — ABNORMAL HIGH (ref 0.44–1.00)
GFR calc Af Amer: 36 mL/min — ABNORMAL LOW (ref >60)
GFR calc non Af Amer: 31 mL/min — ABNORMAL LOW (ref >60)
Glucose, Bld: 153 mg/dL — ABNORMAL HIGH (ref 70–99)
Potassium: 4.3 mmol/L (ref 3.5–5.1)
Sodium: 137 mmol/L (ref 135–145)

## 2018-07-13 LAB — HEMOGLOBIN A1C
Hgb A1c MFr Bld: 7.4 % — ABNORMAL HIGH (ref 4.8–5.6)
Mean Plasma Glucose: 165.68 mg/dL

## 2018-07-13 LAB — CBC
HCT: 34.6 % — ABNORMAL LOW (ref 36.0–46.0)
Hemoglobin: 9.9 g/dL — ABNORMAL LOW (ref 12.0–15.0)
MCH: 17.2 pg — ABNORMAL LOW (ref 26.0–34.0)
MCHC: 28.6 g/dL — ABNORMAL LOW (ref 30.0–36.0)
MCV: 60 fL — ABNORMAL LOW (ref 80.0–100.0)
Platelets: 209 10*3/uL (ref 150–400)
RBC: 5.77 MIL/uL — ABNORMAL HIGH (ref 3.87–5.11)
RDW: 19.1 % — ABNORMAL HIGH (ref 11.5–15.5)
WBC: 8.6 10*3/uL (ref 4.0–10.5)
nRBC: 0.2 % (ref 0.0–0.2)

## 2018-07-13 LAB — TROPONIN I: Troponin I: <0.03 ng/mL (ref <0.03)

## 2018-07-13 LAB — PROTIME-INR
INR: 2.89
Prothrombin Time: 29.8 seconds — ABNORMAL HIGH (ref 11.4–15.2)

## 2018-07-13 MED ORDER — FLECAINIDE ACETATE 100 MG PO TABS
100.0000 mg | ORAL_TABLET | Freq: Two times a day (BID) | ORAL | Status: DC
  Administered 2018-07-13 – 2018-07-14 (×2): 100 mg via ORAL
  Filled 2018-07-13 (×3): qty 1

## 2018-07-13 MED ORDER — WARFARIN SODIUM 5 MG PO TABS
5.0000 mg | ORAL_TABLET | Freq: Once | ORAL | Status: AC
  Administered 2018-07-13: 5 mg via ORAL
  Filled 2018-07-13: qty 1

## 2018-07-13 NOTE — Progress Notes (Addendum)
Progress Note  Patient Name: Jenna Mcdonald Date of Encounter: 07/13/2018  Primary Cardiologist: Fransico Him, MD   Subjective   Ms. Mcauliffe was seen sitting up in her bed this morning. She was anxious about her still being in atrial fibrillation.   Inpatient Medications    Scheduled Meds: . flecainide  50 mg Oral Q12H  . insulin aspart  0-15 Units Subcutaneous TID WC  . metoprolol tartrate  25 mg Oral BID  . pantoprazole  40 mg Oral Daily  . Warfarin - Pharmacist Dosing Inpatient   Does not apply q1800   Continuous Infusions: . diltiazem (CARDIZEM) infusion 10 mg/hr (07/13/18 0059)   PRN Meds: acetaminophen, ondansetron (ZOFRAN) IV   Vital Signs    Vitals:   07/12/18 1749 07/12/18 1929 07/13/18 0015 07/13/18 0455  BP: (!) 144/72 116/77 123/63 123/67  Pulse: 93 89 (!) 55 (!) 55  Resp: 18 20 20 20   Temp: 98.3 F (36.8 C) 98.4 F (36.9 C) 98.2 F (36.8 C) 98.3 F (36.8 C)  TempSrc: Oral Oral Oral Oral  SpO2: 97% 98% 95% 96%  Weight: 107.9 kg   108.4 kg  Height: 5\' 5"  (1.651 m)       Intake/Output Summary (Last 24 hours) at 07/13/2018 0935 Last data filed at 07/13/2018 0507 Gross per 24 hour  Intake 120 ml  Output 200 ml  Net -80 ml   Filed Weights   07/12/18 0920 07/12/18 1749 07/13/18 0455  Weight: 115 kg 107.9 kg 108.4 kg    Telemetry    Atrial fibrillation - Personally Reviewed  ECG    Atrial fibrillation- Personally Reviewed  Physical Exam   Physical Exam  Constitutional: Appears well-developed and well-nourished. No distress.  HENT:  Head: Normocephalic and atraumatic.  Eyes: Conjunctivae are normal.  Cardiovascular: irregularly irregular, tachycardia Respiratory: Effort normal and breath sounds normal. No respiratory distress. No wheezes.  GI: Soft. Bowel sounds are normal. No distension. There is no tenderness.  Musculoskeletal: No edema.  Neurological: Is alert.  Skin: Not diaphoretic. No erythema.  Psychiatric: Normal mood  and affect. Behavior is normal. Judgment and thought content normal.    Labs    Chemistry Recent Labs  Lab 07/12/18 1140 07/12/18 1647 07/13/18 0403  NA 138 138 137  K 4.2 4.3 4.3  CL 104 103 104  CO2 24 25 21*  GLUCOSE 173* 184* 153*  BUN 16 16 26*  CREATININE 1.23* 1.32* 1.62*  CALCIUM 9.3 9.4 9.0  PROT 7.0  --   --   ALBUMIN 3.8  --   --   AST 38  --   --   ALT 55*  --   --   ALKPHOS 94  --   --   BILITOT 0.7  --   --   GFRNONAA 43* 39* 31*  GFRAA 50* 45* 36*  ANIONGAP 10 10 12      Hematology Recent Labs  Lab 07/07/18 0405 07/12/18 1140 07/13/18 0403  WBC 8.1 8.2 8.6  RBC 5.31* 6.10* 5.77*  HGB 9.3* 10.5* 9.9*  HCT 32.9* 37.5 34.6*  MCV 62.0* 61.5* 60.0*  MCH 17.5* 17.2* 17.2*  MCHC 28.3* 28.0* 28.6*  RDW 19.9* 19.9* 19.1*  PLT 179 210 209    Cardiac Enzymes Recent Labs  Lab 07/12/18 1647 07/12/18 2144 07/13/18 0403  TROPONINI <0.03 <0.03 <0.03   No results for input(s): TROPIPOC in the last 168 hours.   BNPNo results for input(s): BNP, PROBNP in the last 168 hours.  DDimer No results for input(s): DDIMER in the last 168 hours.   Radiology    No results found.  Cardiac Studies   TEE 07/07/18 LV EF: 55% -   60%  ------------------------------------------------------------------- History:   PMH:  Shortness of Breath. Chest Tightness. Sleep Apnea.  Atrial fibrillation.  Risk factors:  Hypertension. Diabetes mellitus. Dyslipidemia.  ------------------------------------------------------------------- Study Conclusions  - Left ventricle: Systolic function was normal. The estimated   ejection fraction was in the range of 55% to 60%. No evidence of   thrombus. - Aortic valve: No evidence of vegetation. - Mitral valve: No evidence of vegetation. There was mild   regurgitation. - Left atrium: No evidence of thrombus in the atrial cavity or   appendage. No evidence of thrombus in the atrial cavity or   appendage. No evidence of thrombus  in the atrial cavity or   appendage. - Right atrium: No evidence of thrombus in the atrial cavity or   appendage. - Atrial septum: There was a patent foramen ovale with flow noted   by color flow Doppler. - Tricuspid valve: No evidence of vegetation. - Pulmonic valve: No evidence of vegetation.  Impressions:  - Successful cardioversion. No cardiac source of emboli was   indentified.  Lexiscan February 2017  Nuclear stress EF: 76%.  There was no ST segment deviation noted during stress.  The study is normal.  The left ventricular ejection fraction is hyperdynamic (>65%).    Patient Profile     72 y.o. female with chronic afib, osa on bipap, htn, aortic stenosis who presented with symptoms of feeling sick and fatigued.   Assessment & Plan    Atrial Fibrillation with RVR Patient with recent admission for atrial fibrillation 07/02/18-11/013/19 with successful TEE DCCV on 07/07/18. She was discharged on diltiazem 90mg  4 times daily which she was not able to adhere to.   CHADSVASC score of 4. Anticoagulation with warfarin. Rate control with diltiazem drip and po flecainide 3mg .   Patient's heart rate has ranged 50-70s and bp 110-170s/60-80s over the past 24 hrs. Patient remains in atrial fibrillation   -continue IV diltiazem -continue metoprolol 25mg  bid -Changed Flecainide to 100mg  bid  -plan to cardiovert on 07/14/18 -outpatient coronary cta for ischemic eval -continue warfarin per pharmacy dosing    Diabetes Mellitus Patient has an A1C of 7.4 with blood glucose ranging 120-170s.  -SSI  Iron deficiency anemia  Hb=9.9, hct=34.6, rdw=19.1. Iron=16, tibc=395, ferritin=14  Colonoscopy July 2019 showed diverticulosis and multiple colonic angiodysplastic lesion, EDG showing multiple nonbleeding duodenal ulcer.   -Continue protonix    For questions or updates, please contact [facility] Please consult [email or web address] for contact info under         Signed, Lars Mage, MD  07/13/2018, 9:35 AM

## 2018-07-13 NOTE — Progress Notes (Signed)
ANTICOAGULATION CONSULT NOTE - follow-up Consult  Pharmacy Consult for Coumadin Indication: atrial fibrillation  Allergies  Allergen Reactions  . Aromasin [Exemestane]     Hair loss   . Femara [Letrozole]     unknown  . Gemfibrozil Other (See Comments)    Other reaction(s): Other (See Comments) Unknown Severe fatigue  Other reaction(s): Other (See Comments) Unknown Severe fatigue Unknown  . Hydrocodone-Acetaminophen Nausea And Vomiting    Patient reports terrible nausea; plain tylenol is ok with patient  . Letrozole Other (See Comments)    Unknown reactions   . Metoprolol Diarrhea    Fatigue   . Other Other (See Comments)    Other reaction(s): Other (See Comments) Numerous cancer drugs - unknown reaction Other reaction(s): Other (See Comments) Numerous cancer drugs - unknown reaction Numerous cancer drugs - unknown reaction  . Tamoxifen     Bone and body aches   . Tetracycline Other (See Comments)    Other reaction(s): Other (See Comments) Unknown reaction Other reaction(s): Other (See Comments) Unknown reaction Unknown reaction  . Tetracyclines & Related Other (See Comments)    Vaginal infections    . Toprol Xl [Metoprolol Tartrate]     Hair loss  . Verapamil Other (See Comments)    Other reaction(s): Other (See Comments) Unknown reaction Other reaction(s): Other (See Comments) Unknown reaction Unknown reaction  . Vicodin [Hydrocodone-Acetaminophen] Nausea And Vomiting    Patient reports terrible nausea; plain tylenol is ok with patient    Patient Measurements: Height: 5\' 5"  (165.1 cm) Weight: 238 lb 14.4 oz (108.4 kg)(scale c) IBW/kg (Calculated) : 57  Vital Signs: Temp: 98.3 F (36.8 C) (11/19 0455) Temp Source: Oral (11/19 0455) BP: 108/54 (11/19 1059) Pulse Rate: 70 (11/19 1059)  Labs: Recent Labs    07/12/18 1140 07/12/18 1647 07/12/18 2144 07/13/18 0403  HGB 10.5*  --   --  9.9*  HCT 37.5  --   --  34.6*  PLT 210  --   --  209   LABPROT 37.4*  --   --  29.8*  INR 3.88  --   --  2.89  CREATININE 1.23* 1.32*  --  1.62*  TROPONINI  --  <0.03 <0.03 <0.03    Estimated Creatinine Clearance: 38.5 mL/min (A) (by C-G formula based on SCr of 1.62 mg/dL (H)).   Medical History: Past Medical History:  Diagnosis Date  . Anemia   . Aortic stenosis    mild by echo 08/2016  . Breast CA ([place])    left  . Carotid stenosis    1-39% left  . Carotid stenosis   . Degenerative disc disease, lumbar    of the knees  . Diabetes mellitus   . 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[place])   . Fatigue    Severe secondary to to gemfibrozil  . Gastroesophageal reflux disease    denies  . Hammertoe    left second toe  . Hypercholesteremia   . Hypertension   . Insomnia   . Irritable bowel   . Low back pain   . Metabolic syndrome   . Obesity   . OSA on CPAP \   bipap  . Osteoarthritis   . Osteopenia   . Persistent atrial fibrillation   . Personal history of radiation therapy   . Pneumonia   . PONV (postoperative nausea and vomiting)   . Sigmoid diverticulosis   . Thalassemia minor   . Vitamin D deficiency     Assessment: 72 yo F presents with  fatigue. INR on admit was supratherapeutic at 3.8 and held for one dose. PTA warf 5mg  every day except 7.5mg  Th. Last dose was taken on 11/17.    INR therapeutic at 2.89. No signs/symtoms of bleeding noted. Hgb 9.9, plts 209.  Goal of Therapy:  INR 2-3 Monitor platelets by anticoagulation protocol: Yes   Plan:  Warfarin 5mg  tonight  Monitor daily INR, CBC, s/s of bleed   Azzie Roup D PGY1 Pharmacy Resident  Phone [phone] Please use AMION for clinical pharmacists numbers  07/13/2018      11:10 AM

## 2018-07-13 NOTE — Plan of Care (Signed)
Patient is knowledgeable of her plan of treatment. Will continue to educate as needed.

## 2018-07-14 ENCOUNTER — Inpatient Hospital Stay (HOSPITAL_COMMUNITY): Payer: Medicare Other | Admitting: Anesthesiology

## 2018-07-14 ENCOUNTER — Encounter (HOSPITAL_COMMUNITY)
Admission: EM | Disposition: A | Discharge status: Home / Self Care | Payer: Self-pay | Source: Home / Self Care | Attending: Cardiology

## 2018-07-14 ENCOUNTER — Encounter (HOSPITAL_COMMUNITY): Payer: Self-pay

## 2018-07-14 DIAGNOSIS — I4892 Unspecified atrial flutter: Secondary | ICD-10-CM

## 2018-07-14 DIAGNOSIS — G4733 Obstructive sleep apnea (adult) (pediatric): Secondary | ICD-10-CM

## 2018-07-14 HISTORY — PX: CARDIOVERSION: SHX1299

## 2018-07-14 LAB — BASIC METABOLIC PANEL
Anion gap: 10 (ref 5–15)
BUN: 29 mg/dL — ABNORMAL HIGH (ref 8–23)
CO2: 21 mmol/L — ABNORMAL LOW (ref 22–32)
Calcium: 8.9 mg/dL (ref 8.9–10.3)
Chloride: 107 mmol/L (ref 98–111)
Creatinine, Ser: 1.4 mg/dL — ABNORMAL HIGH (ref 0.44–1.00)
GFR calc Af Amer: 42 mL/min — ABNORMAL LOW (ref >60)
GFR calc non Af Amer: 37 mL/min — ABNORMAL LOW (ref >60)
Glucose, Bld: 145 mg/dL — ABNORMAL HIGH (ref 70–99)
Potassium: 4.7 mmol/L (ref 3.5–5.1)
Sodium: 138 mmol/L (ref 135–145)

## 2018-07-14 LAB — PROTIME-INR
INR: 2.64
Prothrombin Time: 27.8 seconds — ABNORMAL HIGH (ref 11.4–15.2)

## 2018-07-14 LAB — GLUCOSE, CAPILLARY
Glucose-Capillary: 150 mg/dL — ABNORMAL HIGH (ref 70–99)
Glucose-Capillary: 110 mg/dL — ABNORMAL HIGH (ref 70–99)
Glucose-Capillary: 91 mg/dL (ref 70–99)
Glucose-Capillary: 206 mg/dL — ABNORMAL HIGH (ref 70–99)

## 2018-07-14 LAB — MAGNESIUM: Magnesium: 2.2 mg/dL (ref 1.7–2.4)

## 2018-07-14 SURGERY — CARDIOVERSION
Anesthesia: General

## 2018-07-14 MED ORDER — DILTIAZEM HCL 90 MG PO TABS: 90.0000 mg | ORAL_TABLET | Freq: Two times a day (BID) | ORAL | 5 refills | Status: DC

## 2018-07-14 MED ORDER — LIDOCAINE HCL (CARDIAC) PF 100 MG/5ML IV SOSY
PREFILLED_SYRINGE | INTRAVENOUS | Status: DC | PRN
  Administered 2018-07-14: 20 mg via INTRAVENOUS

## 2018-07-14 MED ORDER — APIXABAN 5 MG PO TABS: 5.0000 mg | ORAL_TABLET | Freq: Two times a day (BID) | ORAL | 0 refills | Status: DC

## 2018-07-14 MED ORDER — PROPOFOL 500 MG/50ML IV EMUL
INTRAVENOUS | Status: DC | PRN
  Administered 2018-07-14: 70 ug/kg/min via INTRAVENOUS

## 2018-07-14 MED ORDER — FLECAINIDE ACETATE 100 MG PO TABS: 100.0000 mg | ORAL_TABLET | Freq: Two times a day (BID) | ORAL | 0 refills | Status: DC

## 2018-07-14 MED ORDER — WARFARIN SODIUM 5 MG PO TABS
5.0000 mg | ORAL_TABLET | Freq: Once | ORAL | Status: DC
  Filled 2018-07-14: qty 1

## 2018-07-14 MED ORDER — SODIUM CHLORIDE 0.9 % IV SOLN
INTRAVENOUS | Status: DC | PRN
  Administered 2018-07-14: 13:00:00 via INTRAVENOUS

## 2018-07-14 MED ORDER — DILTIAZEM HCL 90 MG PO TABS: 90.0000 mg | ORAL_TABLET | Freq: Two times a day (BID) | ORAL | 0 refills | Status: DC

## 2018-07-14 NOTE — Progress Notes (Signed)
ANTICOAGULATION CONSULT NOTE - follow-up Consult  Pharmacy Consult for Coumadin Indication: atrial fibrillation  Allergies  Allergen Reactions  . Aromasin [Exemestane]     Hair loss   . Femara [Letrozole]     unknown  . Gemfibrozil Other (See Comments)    Other reaction(s): Other (See Comments) Unknown Severe fatigue  Other reaction(s): Other (See Comments) Unknown Severe fatigue Unknown  . Hydrocodone-Acetaminophen Nausea And Vomiting    Patient reports terrible nausea; plain tylenol is ok with patient  . Letrozole Other (See Comments)    Unknown reactions   . Metoprolol Diarrhea    Fatigue   . Other Other (See Comments)    Other reaction(s): Other (See Comments) Numerous cancer drugs - unknown reaction Other reaction(s): Other (See Comments) Numerous cancer drugs - unknown reaction Numerous cancer drugs - unknown reaction  . Tamoxifen     Bone and body aches   . Tetracycline Other (See Comments)    Other reaction(s): Other (See Comments) Unknown reaction Other reaction(s): Other (See Comments) Unknown reaction Unknown reaction  . Tetracyclines & Related Other (See Comments)    Vaginal infections    . Toprol Xl [Metoprolol Tartrate]     Hair loss  . Verapamil Other (See Comments)    Other reaction(s): Other (See Comments) Unknown reaction Other reaction(s): Other (See Comments) Unknown reaction Unknown reaction  . Vicodin [Hydrocodone-Acetaminophen] Nausea And Vomiting    Patient reports terrible nausea; plain tylenol is ok with patient    Patient Measurements: Height: 5\' 5"  (165.1 cm) Weight: 240 lb 4.8 oz (109 kg) IBW/kg (Calculated) : 57  Vital Signs: Temp: 98 F (36.7 C) (11/20 0636) Temp Source: Oral (11/20 0636) BP: 118/72 (11/20 0636) Pulse Rate: 64 (11/20 0636)  Labs: Recent Labs    07/12/18 1140 07/12/18 1647 07/12/18 2144 07/13/18 0403 07/14/18 0319  HGB 10.5*  --   --  9.9*  --   HCT 37.5  --   --  34.6*  --   PLT 210  --    --  209  --   LABPROT 37.4*  --   --  29.8* 27.8*  INR 3.88  --   --  2.89 2.64  CREATININE 1.23* 1.32*  --  1.62* 1.40*  TROPONINI  --  <0.03 <0.03 <0.03  --     Estimated Creatinine Clearance: 44.6 mL/min (A) (by C-G formula based on SCr of 1.4 mg/dL (H)).   Medical History: Past Medical History:  Diagnosis Date  . Anemia   . Aortic stenosis    mild by echo 08/2016  . Breast CA ([place])    left  . Carotid stenosis    1-39% left  . Carotid stenosis   . Degenerative disc disease, lumbar    of the knees  . Diabetes mellitus   . 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[place])   . Fatigue    Severe secondary to to gemfibrozil  . Gastroesophageal reflux disease    denies  . Hammertoe    left second toe  . Hypercholesteremia   . Hypertension   . Insomnia   . Irritable bowel   . Low back pain   . Metabolic syndrome   . Obesity   . OSA on CPAP \   bipap  . Osteoarthritis   . Osteopenia   . Persistent atrial fibrillation   . Personal history of radiation therapy   . Pneumonia   . PONV (postoperative nausea and vomiting)   . Sigmoid diverticulosis   . Thalassemia minor   .  Vitamin D deficiency     Assessment: 72 yo F presents with fatigue. INR on admit was supratherapeutic at 3.8 and held for one dose. PTA warf 5mg  every day except 7.5mg  Th.  INR remains therapeutic at 2.64. No signs/symtoms of bleeding noted.   Goal of Therapy:  INR 2-3 Monitor platelets by anticoagulation protocol: Yes   Plan:  Warfarin 5mg  tonight  Monitor daily INR, signs/symtoms of bleeding   Azzie Roup D PGY1 Pharmacy Resident  Phone [phone] Please use AMION for clinical pharmacists numbers  07/14/2018      9:27 AM

## 2018-07-14 NOTE — Interval H&P Note (Signed)
History and Physical Interval Note:  07/14/2018 1:12 PM  Jenna Mcdonald  has presented today for surgery, with the diagnosis of AFIB  The various methods of treatment have been discussed with the patient and family. After consideration of risks, benefits and other options for treatment, the patient has consented to  Procedure(s): CARDIOVERSION (N/A) as a surgical intervention .  The patient's history has been reviewed, patient examined, no change in status, stable for surgery.  I have reviewed the patient's chart and labs.  Questions were answered to the patient's satisfaction.     Safiya Girdler [employer]

## 2018-07-14 NOTE — Anesthesia Postprocedure Evaluation (Signed)
Anesthesia Post Note  Patient: Jenna Mcdonald  Procedure(s) Performed: CARDIOVERSION (N/A )     Patient location during evaluation: PACU Anesthesia Type: General Level of consciousness: awake and alert Pain management: pain level controlled Vital Signs Assessment: post-procedure vital signs reviewed and stable Respiratory status: spontaneous breathing, nonlabored ventilation, respiratory function stable and patient connected to nasal cannula oxygen Cardiovascular status: blood pressure returned to baseline and stable Postop Assessment: no apparent nausea or vomiting Anesthetic complications: no    Last Vitals:  Vitals:   07/14/18 1335 07/14/18 1345  BP: (!) 108/56 (!) 109/50  Pulse: 60 61  Resp: (!) 21 16  Temp:    SpO2: 97% 100%    Last Pain:  Vitals:   07/14/18 1345  TempSrc:   PainSc: 0-No pain                 Tiajuana Amass

## 2018-07-14 NOTE — Procedures (Signed)
Electrical Cardioversion Procedure Note KESS MCILWAIN [id number] [birth year]  Procedure: Electrical Cardioversion Indications:  Atrial Flutter  Procedure Details Consent: Risks of procedure as well as the alternatives and risks of each were explained to the (patient/caregiver).  Consent for procedure obtained. Time Out: Verified patient identification, verified procedure, site/side was marked, verified correct patient position, special equipment/implants available, medications/allergies/relevent history reviewed, required imaging and test results available.  Performed  Patient placed on cardiac monitor, pulse oximetry, supplemental oxygen as necessary.  Sedation given: Per anesthesiology Pacer pads placed anterior and posterior chest.  Cardioverted 1 time(s).  Cardioverted at [place].  Evaluation Findings: Post procedure EKG shows: NSR Complications: None Patient did tolerate procedure well.   Loralie Champagne 07/14/2018, 1:15 PM

## 2018-07-14 NOTE — Discharge Summary (Signed)
Discharge Summary    Patient ID: Jenna Mcdonald MRN: [id number]; DOB: [birth year]  Admit date: 07/12/2018 Discharge date: 07/14/2018  Primary Care Provider: Josetta Huddle, MD  Primary Cardiologist: Fransico Him, MD  Primary Electrophysiologist:  None   Discharge Diagnoses    Active Problems:   Atrial fibrillation with RVR ([place])  Allergies Allergies  Allergen Reactions  . Aromasin [Exemestane]     Hair loss   . Femara [Letrozole]     unknown  . Gemfibrozil Other (See Comments)    Other reaction(s): Other (See Comments) Unknown Severe fatigue  Other reaction(s): Other (See Comments) Unknown Severe fatigue Unknown  . Hydrocodone-Acetaminophen Nausea And Vomiting    Patient reports terrible nausea; plain tylenol is ok with patient  . Letrozole Other (See Comments)    Unknown reactions   . Metoprolol Diarrhea    Fatigue   . Other Other (See Comments)    Other reaction(s): Other (See Comments) Numerous cancer drugs - unknown reaction Other reaction(s): Other (See Comments) Numerous cancer drugs - unknown reaction Numerous cancer drugs - unknown reaction  . Tamoxifen     Bone and body aches   . Tetracycline Other (See Comments)    Other reaction(s): Other (See Comments) Unknown reaction Other reaction(s): Other (See Comments) Unknown reaction Unknown reaction  . Tetracyclines & Related Other (See Comments)    Vaginal infections    . Toprol Xl [Metoprolol Tartrate]     Hair loss  . Verapamil Other (See Comments)    Other reaction(s): Other (See Comments) Unknown reaction Other reaction(s): Other (See Comments) Unknown reaction Unknown reaction  . Vicodin [Hydrocodone-Acetaminophen] Nausea And Vomiting    Patient reports terrible nausea; plain tylenol is ok with patient    Diagnostic Studies/Procedures    TEE Cardioversion  LV EF: 55% -   60%  ------------------------------------------------------------------- History:   PMH:  Shortness of  Breath. Chest Tightness. Sleep Apnea.  Atrial fibrillation.  Risk factors:  Hypertension. Diabetes mellitus. Dyslipidemia.  ------------------------------------------------------------------- Study Conclusions  - Left ventricle: Systolic function was normal. The estimated   ejection fraction was in the range of 55% to 60%. No evidence of   thrombus. - Aortic valve: No evidence of vegetation. - Mitral valve: No evidence of vegetation. There was mild   regurgitation. - Left atrium: No evidence of thrombus in the atrial cavity or   appendage. No evidence of thrombus in the atrial cavity or   appendage. No evidence of thrombus in the atrial cavity or   appendage. - Right atrium: No evidence of thrombus in the atrial cavity or   appendage. - Atrial septum: There was a patent foramen ovale with flow noted   by color flow Doppler. - Tricuspid valve: No evidence of vegetation. - Pulmonic valve: No evidence of vegetation.  Impressions:  - Successful cardioversion. No cardiac source of emboli was   indentified.     History of Present Illness    72 y.o. female with chronic afib, osa on bipap, htn, aortic stenosis who presented with symptoms of feeling sick and fatigued.Found to be in atrial fibrillation.   Hospital Course     Consultants: none  Atrial Fibrillation with RVR Patient with newly diagnosed atrial fibrillation and TEE DCCV on 07/07/18 presented with generalized feeling of being unwell. She was discharged on diltiazem 90mg  four times daily but was not able to adhere to that regimen. Patient was found to be in atrial fibrillation again during this admission. She was placed on IV diltiazem and started on  Flecainide. The patient continued to be atrial fibrillation despite medical management and therefore decision was made to cardiovert her. She was successfully cardioverted back to normal sinus rhythm and discharged with metoprolol 25mg  bid, flecainide 100mg  bid, diltiazem  90mg  bid, and told to continue her home dose of warfarin.    Discharge Vitals Blood pressure (!) 109/50, pulse 61, temperature 98.7 F (37.1 C), temperature source Oral, resp. rate 16, height 5\' 5"  (1.651 m), weight 109 kg, SpO2 100 %.  Filed Weights   07/13/18 0455 07/14/18 0003 07/14/18 0500  Weight: 108.4 kg 109 kg 109 kg    Labs & Radiologic Studies    CBC Recent Labs    07/12/18 1140 07/13/18 0403  WBC 8.2 8.6  NEUTROABS 5.8  --   HGB 10.5* 9.9*  HCT 37.5 34.6*  MCV 61.5* 60.0*  PLT 210 710   Basic Metabolic Panel Recent Labs    07/13/18 0403 07/14/18 0319  NA 137 138  K 4.3 4.7  CL 104 107  CO2 21* 21*  GLUCOSE 153* 145*  BUN 26* 29*  CREATININE 1.62* 1.40*  CALCIUM 9.0 8.9  MG  --  2.2   Liver Function Tests Recent Labs    07/12/18 1140  AST 38  ALT 55*  ALKPHOS 94  BILITOT 0.7  PROT 7.0  ALBUMIN 3.8   No results for input(s): LIPASE, AMYLASE in the last 72 hours. Cardiac Enzymes Recent Labs    07/12/18 1647 07/12/18 2144 07/13/18 0403  TROPONINI <0.03 <0.03 <0.03   BNP Invalid input(s): POCBNP D-Dimer No results for input(s): DDIMER in the last 72 hours. Hemoglobin A1C Recent Labs    07/13/18 0403  HGBA1C 7.4*   Fasting Lipid Panel No results for input(s): CHOL, HDL, LDLCALC, TRIG, CHOLHDL, LDLDIRECT in the last 72 hours. Thyroid Function Tests Recent Labs    07/12/18 1647  TSH 1.869   _____________  Dg Chest Port 1 View  Result Date: 07/02/2018 CLINICAL DATA:  Cough. EXAM: PORTABLE CHEST 1 VIEW COMPARISON:  12/04/2015 FINDINGS: There is peribronchial thickening with slight interstitial accentuation at the lung bases without acute infiltrates or effusions. Heart size and pulmonary vascularity are normal. No significant bone abnormality. IMPRESSION: Bronchitic changes. Electronically Signed   By: Lorriane Shire M.D.   On: 07/02/2018 11:08   Disposition   Pt is being discharged home today in good condition.  Follow-up Plans  & Appointments    Follow-up Information    Charlie Pitter, PA-C. Go on 07/16/2018.   Specialties:  Cardiology, Radiology Why:  @10 :30am for hospital follow up  Contact information: [address] [place] [address] [phone]            Discharge Medications   Allergies as of 07/14/2018      Reactions   Aromasin [exemestane]    Hair loss   Femara [letrozole]    unknown   Gemfibrozil Other (See Comments)   Other reaction(s): Other (See Comments) Unknown Severe fatigue Other reaction(s): Other (See Comments) Unknown Severe fatigue Unknown   Hydrocodone-acetaminophen Nausea And Vomiting   Patient reports terrible nausea; plain tylenol is ok with patient   Letrozole Other (See Comments)   Unknown reactions    Metoprolol Diarrhea   Fatigue   Other Other (See Comments)   Other reaction(s): Other (See Comments) Numerous cancer drugs - unknown reaction Other reaction(s): Other (See Comments) Numerous cancer drugs - unknown reaction Numerous cancer drugs - unknown reaction   Tamoxifen  Bone and body aches   Tetracycline Other (See Comments)   Other reaction(s): Other (See Comments) Unknown reaction Other reaction(s): Other (See Comments) Unknown reaction Unknown reaction   Tetracyclines & Related Other (See Comments)   Vaginal infections    Toprol Xl [metoprolol Tartrate]    Hair loss   Verapamil Other (See Comments)   Other reaction(s): Other (See Comments) Unknown reaction Other reaction(s): Other (See Comments) Unknown reaction Unknown reaction   Vicodin [hydrocodone-acetaminophen] Nausea And Vomiting   Patient reports terrible nausea; plain tylenol is ok with patient      Medication List    TAKE these medications   acetaminophen 500 MG tablet Commonly known as:  TYLENOL Take 500 mg by mouth every 6 (six) hours as needed for mild pain. For back pain   Biotin 10 MG Tabs Take 10 mg by mouth 2 (two) times daily.   CALCIUM  600 + D PO Take 1 tablet by mouth daily.   diltiazem 90 MG tablet Commonly known as:  CARDIZEM Take 1 tablet (90 mg total) by mouth 2 (two) times daily. What changed:  when to take this   diphenhydramine-acetaminophen 25-500 MG Tabs tablet Commonly known as:  TYLENOL PM Take 1 tablet by mouth at bedtime.   flecainide 100 MG tablet Commonly known as:  TAMBOCOR Take 1 tablet (100 mg total) by mouth every 12 (twelve) hours.   furosemide 20 MG tablet Commonly known as:  LASIX Take 1 tablet (20 mg total) by mouth daily.   glimepiride 4 MG tablet Commonly known as:  AMARYL Take 2 mg by mouth daily.   metFORMIN 500 MG 24 hr tablet Commonly known as:  GLUCOPHAGE-XR Take 1 tablet by mouth 2 (two) times daily.   metoprolol tartrate 25 MG tablet Commonly known as:  LOPRESSOR Take 1 tablet (25 mg total) by mouth 2 (two) times daily.   multivitamin with minerals Tabs tablet Take 1 tablet by mouth daily.   pantoprazole 40 MG tablet Commonly known as:  PROTONIX Take 1 tablet (40 mg total) by mouth daily.   triamcinolone cream 0.1 % Commonly known as:  KENALOG Apply 1 application topically 2 (two) times daily.   warfarin 5 MG tablet Commonly known as:  COUMADIN Take 5 mg by mouth See admin instructions. 7.5 mg  Thursday. 5mg  all the other days        Acute coronary syndrome (MI, NSTEMI, STEMI, etc) this admission?: No.    Outstanding Labs/Studies   Coronary CTA for ischemic eval   Duration of Discharge Encounter   Greater than 30 minutes including physician time.  Eusebio Friendly, MD 07/14/2018, 4:17 PM

## 2018-07-14 NOTE — Progress Notes (Signed)
Progress Note  Patient Name: Jenna Mcdonald Date of Encounter: 07/14/2018  Primary Cardiologist: Fransico Him, MD   Subjective   Ms. Slovacek was seen resting in her bed this morning. She stated that she did not have any chest pain or dyspnea at this time. She felt comfortable.   Inpatient Medications    Scheduled Meds: . flecainide  100 mg Oral Q12H  . insulin aspart  0-15 Units Subcutaneous TID WC  . metoprolol tartrate  25 mg Oral BID  . pantoprazole  40 mg Oral Daily  . warfarin  5 mg Oral ONCE-1800  . Warfarin - Pharmacist Dosing Inpatient   Does not apply q1800   Continuous Infusions: . diltiazem (CARDIZEM) infusion 10 mg/hr (07/14/18 0527)   PRN Meds: acetaminophen, ondansetron (ZOFRAN) IV   Vital Signs    Vitals:   07/13/18 2109 07/14/18 0003 07/14/18 0500 07/14/18 0636  BP: 130/81 123/90  118/72  Pulse: 64 (!) 57  64  Resp:  18  18  Temp:  98 F (36.7 C)  98 F (36.7 C)  TempSrc:  Oral  Oral  SpO2:  95%  99%  Weight:  109 kg 109 kg   Height:        Intake/Output Summary (Last 24 hours) at 07/14/2018 1119 Last data filed at 07/14/2018 0902 Gross per 24 hour  Intake 675.39 ml  Output 1700 ml  Net -1024.61 ml   Filed Weights   07/13/18 0455 07/14/18 0003 07/14/18 0500  Weight: 108.4 kg 109 kg 109 kg    Telemetry    Atrial fibrillation - Personally Reviewed  ECG    Atrial fibrillation- Personally Reviewed  Physical Exam   Physical Exam  Constitutional: Appears well-developed and well-nourished. No distress.  HENT:  Head: Normocephalic and atraumatic.  Eyes: Conjunctivae are normal.  Cardiovascular: irregularly irregular, normal rate  Respiratory: Effort normal and breath sounds normal. No respiratory distress. No wheezes.  GI: Soft. Bowel sounds are normal. No distension. There is no tenderness.  Musculoskeletal: No edema.  Neurological: Is alert.  Skin: Not diaphoretic. No erythema.  Psychiatric: Normal mood and affect. Behavior  is normal. Judgment and thought content normal.    Labs    Chemistry Recent Labs  Lab 07/12/18 1140 07/12/18 1647 07/13/18 0403 07/14/18 0319  NA 138 138 137 138  K 4.2 4.3 4.3 4.7  CL 104 103 104 107  CO2 24 25 21* 21*  GLUCOSE 173* 184* 153* 145*  BUN 16 16 26* 29*  CREATININE 1.23* 1.32* 1.62* 1.40*  CALCIUM 9.3 9.4 9.0 8.9  PROT 7.0  --   --   --   ALBUMIN 3.8  --   --   --   AST 38  --   --   --   ALT 55*  --   --   --   ALKPHOS 94  --   --   --   BILITOT 0.7  --   --   --   GFRNONAA 43* 39* 31* 37*  GFRAA 50* 45* 36* 42*  ANIONGAP 10 10 12 10      Hematology Recent Labs  Lab 07/12/18 1140 07/13/18 0403  WBC 8.2 8.6  RBC 6.10* 5.77*  HGB 10.5* 9.9*  HCT 37.5 34.6*  MCV 61.5* 60.0*  MCH 17.2* 17.2*  MCHC 28.0* 28.6*  RDW 19.9* 19.1*  PLT 210 209    Cardiac Enzymes Recent Labs  Lab 07/12/18 1647 07/12/18 2144 07/13/18 0403  TROPONINI <0.03 <0.03 <0.03  No results for input(s): TROPIPOC in the last 168 hours.   BNPNo results for input(s): BNP, PROBNP in the last 168 hours.   DDimer No results for input(s): DDIMER in the last 168 hours.   Radiology    No results found.  Cardiac Studies   TEE 07/07/18 LV EF: 55% - 60%  ------------------------------------------------------------------- History: PMH: Shortness of Breath. Chest Tightness. Sleep Apnea. Atrial fibrillation. Risk factors: Hypertension. Diabetes mellitus. Dyslipidemia.  ------------------------------------------------------------------- Study Conclusions  - Left ventricle: Systolic function was normal. The estimated ejection fraction was in the range of 55% to 60%. No evidence of thrombus. - Aortic valve: No evidence of vegetation. - Mitral valve: No evidence of vegetation. There was mild regurgitation. - Left atrium: No evidence of thrombus in the atrial cavity or appendage. No evidence of thrombus in the atrial cavity or appendage. No evidence of  thrombus in the atrial cavity or appendage. - Right atrium: No evidence of thrombus in the atrial cavity or appendage. - Atrial septum: There was a patent foramen ovale with flow noted by color flow Doppler. - Tricuspid valve: No evidence of vegetation. - Pulmonic valve: No evidence of vegetation.  Impressions:  - Successful cardioversion. No cardiac source of emboli was indentified.  Lexiscan February 2017  Nuclear stress EF: 76%.  There was no ST segment deviation noted during stress.  The study is normal.  The left ventricular ejection fraction is hyperdynamic (>65%).  Patient Profile     72 y.o. female with chronic afib, osa on bipap, htn, aortic stenosis who presented with symptoms of feeling sick and fatigued.   Assessment & Plan    Atrial Fibrillation with RVR Continues to remain in afib with rates ranging 50-60s and pressures ranging 110-130/60-80s.   Will plan to cardiovert today at 13:30. CHADSVASC score of 4. Anticoagulation with warfarin due to financial restraints.   -continue IV diltiazem -continue metoprolol 25mg  bid -continue Flecainide to 100mg  bid  -outpatient coronary cta for ischemic eval -continue warfarin per pharmacy dosing, inr=2.64 today 07/14/18  Diabetes Mellitus Patient has an A1C of 7.4 with blood glucose ranging 110-170s considering she has been npo since midnight.   -SSI  Iron deficiency anemia  Hb=9.9, hct=34.6, rdw=19.1. Iron=16, tibc=395, ferritin=14  Colonoscopy July 2019 showed diverticulosis and multiple colonic angiodysplastic lesion, EDG showing multiple nonbleeding duodenal ulcer.   -Continue protonix    For questions or updates, please contact [facility] Please consult [email or web address] for contact info under        Signed, Lars Mage, MD  07/14/2018, 11:19 AM

## 2018-07-14 NOTE — Transfer of Care (Signed)
Immediate Anesthesia Transfer of Care Note  Patient: Jenna Mcdonald  Procedure(s) Performed: CARDIOVERSION (N/A )  Patient Location: Endoscopy Unit  Anesthesia Type:General  Level of Consciousness: awake, alert , oriented and patient cooperative  Airway & Oxygen Therapy: Patient Spontanous Breathing and Patient connected to nasal cannula oxygen  Post-op Assessment: Report given to RN and Post -op Vital signs reviewed and stable  Post vital signs: Reviewed and stable  Last Vitals:  Vitals Value Taken Time  BP    Temp    Pulse    Resp    SpO2      Last Pain:  Vitals:   07/14/18 1247  TempSrc: Oral  PainSc: 0-No pain         Complications: No apparent anesthesia complications

## 2018-07-14 NOTE — Progress Notes (Signed)
Patient ready to discharge from unit to home. Family to provided transportation.  Pt ambulated in hallway and tolerated well. No c/o SOB.  All d/c instructions reviewed with patient. Patient in no distress. MEdications and follow up appts reviewed.

## 2018-07-14 NOTE — Anesthesia Preprocedure Evaluation (Signed)
Anesthesia Evaluation  Patient identified by MRN, date of birth, ID band Patient awake    Reviewed: Allergy & Precautions, NPO status , Patient's Chart, lab work & pertinent test results  History of Anesthesia Complications (+) PONVNegative for: history of anesthetic complications  Airway Mallampati: III  TM Distance: >3 FB Neck ROM: Full    Dental no notable dental hx.    Pulmonary sleep apnea and Continuous Positive Airway Pressure Ventilation , former smoker,    Pulmonary exam normal        Cardiovascular hypertension, Pt. on medications + dysrhythmias Atrial Fibrillation + Valvular Problems/Murmurs ("very mild") AS  Rhythm:Irregular Rate:Normal     Neuro/Psych negative neurological ROS  negative psych ROS   GI/Hepatic Neg liver ROS, GERD  ,  Endo/Other  diabetesMorbid obesity  Renal/GU negative Renal ROS  negative genitourinary   Musculoskeletal negative musculoskeletal ROS (+)   Abdominal (+) + obese,   Peds  Hematology negative hematology ROS (+)   Anesthesia Other Findings   Reproductive/Obstetrics                             Anesthesia Physical  Anesthesia Plan  ASA: III  Anesthesia Plan: General   Post-op Pain Management:    Induction: Intravenous  PONV Risk Score and Plan: 4 or greater  Airway Management Planned: Natural Airway and Mask  Additional Equipment: None  Intra-op Plan:   Post-operative Plan:   Informed Consent: I have reviewed the patients History and Physical, chart, labs and discussed the procedure including the risks, benefits and alternatives for the proposed anesthesia with the patient or authorized representative who has indicated his/her understanding and acceptance.     Plan Discussed with:   Anesthesia Plan Comments:         Anesthesia Quick Evaluation

## 2018-07-15 ENCOUNTER — Encounter: Payer: Self-pay | Admitting: Physician Assistant

## 2018-07-15 NOTE — Progress Notes (Addendum)
Cardiology Office Note    Date:  07/16/2018  ID:  MAILANI DEGROOTE, DOB 08-17-1946, MRN [id number] PCP:  Josetta Huddle, MD  Cardiologist:  Fransico Him, MD   Chief Complaint: post-hospital follow-up afib  History of Present Illness:  Jenna Mcdonald is a 72 y.o. female with history of persistent atrial fibrillation on chronic Coumadin(followed with PCP for INR checks), PACs,OSA on BiPAP, HTN, Thalassemia minor, DM, empty sella syndrome, morbid obesity, recent renal insufficiency, recent anemia/GI bleeding who presents for post-hospital follow-up. She was actually admitted twice this month.   She has prior h/o afib. She was previously on PRN rhythmol but indicates she never had to use this. She had normal nuc in 09/2015. She has a history of L carotid bruit with duplex 01/9677 9-38% in [facility].   She was recently admited 07/02/18 with atrial fib RVR in the setting of possible URI symptoms, with hospitalization notable for anemia with +FOBT.GI was consulted and patient was seen by Dr. Watt Climes.She underwent EGD and colonoscopy on 07/06/2018. EGD showed multiple nonbleeding duodenal ulcers. 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 She underwent TEE cardioversion on 07/07/2018 with restoration of sinus rhythm. TEE showed EF 55-60%, otherwise mild MR. Outpatient cardiac CT was planned for occasional chest discomfort. She was placed on diltiazem QID due to patient's marked concern for cost/tiers, as well as metoprolol 25mg  BID. However she was unable to keep up with the QID diltiazem dosing and missed several doses. She was readmitted 07/12/18 with recurrent fatigue and atrial fib. She was loaded with flecainide per Dr. Allison Quarry discussion with EP team. She underwent DCCV on 07/14/18 with successful restoration of NSR; of note Dr. Claris Gladden procedure note indicates DCCV was for atrial flutter. Regarding anticoagulation, she has preferred to  maintain on warfarin instead of changing to NOAC. This is followed by PCP. Most recent labs showed K 4.7, Cr 1.40, Mg 2.2, hgb 9.9, TSH wnl, AST 38, ALT 55.  She returns for follow-up today indicating something is different today. She felt great upon discharge from the hospital and yesterday, even vacuuming and weeding out the garden. However, today while walking into the cardiology office became severely SOB and her son had to get a wheelchair. The patient feels fatigued and is concerned about her blood pressure being low. She had an extensive list of questions about medications, a medical HR monitoring device she purchased, and recent diagnoses so I spent time trying to answer them to the best of my ability. We got her up to walk her in clinic to assess for recurrent dyspnea. She became SOB early on and and requested to sit down after about 50 feet. O2 sat dropped to 74%. In talking with her she also reports that she has had a persistent cough that she has been unable to shake ever since October of this year which has not improved with any cardiac interventions. No chest pain. No orthopnea.   Past Medical History:  Diagnosis Date  . Anemia   . Aortic stenosis    mild by echo 08/2016, not mentioned in 06/2018 TEE  . Breast CA ([place])    left  . Carotid stenosis    1-39% left  . Degenerative disc disease, lumbar    of the knees  . Diabetes mellitus   . 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[place])   . Fatigue    Severe secondary to to gemfibrozil  . Gastroesophageal reflux disease    denies  . GI bleeding  a. 06/2018: EGD showed multiple nonbleeding duodenal ulcers. 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  . Hammertoe    left second toe  . Hypercholesteremia   . Hypertension   . Insomnia   . Irritable bowel   . Low back pain   . Metabolic syndrome   . Mild mitral regurgitation 06/2018  . Morbid obesity ([place])   . OSA on CPAP \   bipap  .  Osteoarthritis   . Osteopenia   . Persistent atrial fibrillation   . Personal history of radiation therapy   . Pneumonia   . PONV (postoperative nausea and vomiting)   . Sigmoid diverticulosis   . Thalassemia minor   . Thalassemia minor   . Vitamin D deficiency     Past Surgical History:  Procedure Laterality Date  . BIOPSY  07/06/2018   Procedure: BIOPSY;  Surgeon: Clarene Essex, MD;  Location: [place];  Service: Endoscopy;;  . BREAST LUMPECTOMY Left 05/28/2009  . BREAST SURGERY Left 10   lumpectomy  . CARDIOVERSION N/A 07/07/2018   Procedure: CARDIOVERSION;  Surgeon: [name], MD;  [facility] END[facility];  Service: Cardiovascular;  Laterality: N/A;  . CARDIOVERSION N/A 07/14/2018   Procedure: CARDIOVERSION;  Surgeon: Larey Dresser, MD;  Location: [facility] END[facility]COPY;  Service: Cardiovascular;  Laterality: N/A;  . CESAREAN SECTION    . COLONOSCOPY WITH PROPOFOL N/A 07/06/2018   Procedure: COLONOSCOPY WITH PROPOFOL;  Surgeon: Clarene Essex, MD;  Location: [place];  Service: Endoscopy;  Laterality: N/A;  . CYSTECTOMY  70   pilonidal   . DILATION AND CURETTAGE OF UTERUS    . ESOPHAGOGASTRODUODENOSCOPY (EGD) WITH PROPOFOL N/A 07/06/2018   Procedure: ESOPHAGOGASTRODUODENOSCOPY (EGD) WITH PROPOFOL;  Surgeon: Clarene Essex, MD;  Location: [place];  Service: Endoscopy;  Laterality: N/A;  . EYE SURGERY Bilateral 12/14   cataracts  . HOT HEMOSTASIS N/A 07/06/2018   Procedure: HOT HEMOSTASIS (ARGON PLASMA COAGULATION/BICAP);  Surgeon: Clarene Essex, MD;  Location: [place];  Service: Endoscopy;  Laterality: N/A;  . MAXIMUM ACCESS (MAS)POSTERIOR LUMBAR INTERBODY FUSION (PLIF) 2 LEVEL N/A 09/22/2013   Procedure: FOR MAXIMUM ACCESS  POSTERIOR LUMBAR INTERBODY FUSION LUMBAR THREE-FOUR FOUR-FIVE;  Surgeon: Eustace Moore, MD;  Location: [place] NEURO ORS;  Service: Neurosurgery;  Laterality: N/A;  . OOPHORECTOMY     wedge section not rem  . 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  REPLACEMENT TOTAL KNEE    . TEE WITHOUT CARDIOVERSION N/A 07/07/2018   Procedure: TRANSESOPHAGEAL ECHOCARDIOGRAM (TEE);  Surgeon: [name], MD;  Location: [place];  Service: Cardiovascular;  Laterality: N/A;  . TONSILLECTOMY    . TUBAL LIGATION      Current Medications: Current Meds  Medication Sig  . acetaminophen (TYLENOL) 500 MG tablet Take 500 mg by mouth every 6 (six) hours as needed for mild pain. For back pain  . Biotin 10 MG TABS Take 10 mg by mouth 2 (two) times daily.  . Calcium Carbonate-Vitamin D (CALCIUM 600 + D PO) Take 1 tablet by mouth daily.   Marland Kitchen diltiazem (CARDIZEM) 90 MG tablet Take 1 tablet (90 mg total) by mouth 2 (two) times daily.  . diphenhydramine-acetaminophen (TYLENOL PM) 25-500 MG TABS tablet Take 1 tablet by mouth at bedtime.  . flecainide (TAMBOCOR) 100 MG tablet Take 1 tablet (100 mg total) by mouth every 12 (twelve) hours.  Marland Kitchen glimepiride (AMARYL) 4 MG tablet Take 2 mg by mouth daily.   . metFORMIN (GLUCOPHAGE-XR) 500 MG 24 hr  tablet Take 1 tablet by mouth 2 (two) times daily.  . metoprolol tartrate (LOPRESSOR) 25 MG tablet Take 1 tablet (25 mg total) by mouth 2 (two) times daily.  . Multiple Vitamin (MULITIVITAMIN WITH MINERALS) TABS Take 1 tablet by mouth daily.  . pantoprazole (PROTONIX) 40 MG tablet Take 1 tablet (40 mg total) by mouth daily.  Marland Kitchen warfarin (COUMADIN) 5 MG tablet Take 5 mg by mouth See admin instructions. 7.5 mg  Thursday. 5mg  all the other days      Allergies:   Aromasin [exemestane]; Femara [letrozole]; Gemfibrozil; Hydrocodone-acetaminophen; Letrozole; Metoprolol; Other; Tamoxifen; Tetracycline; Tetracyclines & related; Toprol xl [metoprolol tartrate]; Verapamil; and Vicodin [hydrocodone-acetaminophen]   Social History   Socioeconomic History  . Marital status: Single    Spouse name: Not on file  . Number of children: Not on file  . Years of education: Not on file  . Highest education level: Not on file  Occupational  History  . Not on file  Social Needs  . Financial resource strain: Not on file  . Food insecurity:    Worry: Not on file    Inability: Not on file  . Transportation needs:    Medical: Not on file    Non-medical: Not on file  Tobacco Use  . Smoking status: Former Smoker    Packs/day: 0.50    Years: 10.00    Pack years: 5.00    Types: Cigarettes    Last attempt to quit: 11/12/2003    Years since quitting: 14.6  . Smokeless tobacco: Never Used  Substance and Sexual Activity  . Alcohol use: Yes    Comment: occasionally  . Drug use: No  . Sexual activity: Never    Birth control/protection: Post-menopausal  Lifestyle  . Physical activity:    Days per week: Not on file    Minutes per session: Not on file  . Stress: Not on file  Relationships  . Social connections:    Talks on phone: Not on file    Gets together: Not on file    Attends religious service: Not on file    Active member of club or organization: Not on file    Attends meetings of clubs or organizations: Not on file    Relationship status: Not on file  Other Topics Concern  . Not on file  Social History Narrative  . Not on file     Family History:  The patient's family history includes Anesthesia problems in her mother; Diabetes in her father and mother.  ROS:   Please see the history of present illness.  All other systems are reviewed and otherwise negative.    PHYSICAL EXAM:   VS:  BP (!) 102/50   Pulse (!) 51   Ht 5\' 5"  (1.651 m)   SpO2 (!) 74%   BMI 39.99 kg/m   BMI: Body mass index is 39.99 kg/m. GEN: Well nourished, well developed morbidly obese WF, in no acute distress HEENT: normocephalic, atraumatic Neck: no JVD, carotid bruits, or masses Cardiac: RRR; no murmurs, rubs, or gallops, no edema  Respiratory:  clear to auscultation bilaterally, normal work of breathing GI: soft, nontender, nondistended, + BS MS: no deformity or atrophy Skin: warm and dry, no rash Neuro:  Alert and Oriented x  3, Strength and sensation are intact, follows commands Psych: euthymic mood, full affect  Wt Readings from Last 3 Encounters:  07/16/18 240 lb 4.8 oz (109 kg)  07/14/18 240 lb 4.8 oz (109 kg)  07/07/18 252  lb 3.3 oz (114.4 kg)      Studies/Labs Reviewed:   EKG:  EKG was ordered today and personally reviewed by me and demonstrates sinus bradycardia 51bpm, nonspecific STT changes  Recent Labs: 07/12/2018: ALT 55; TSH 1.869 07/13/2018: Hemoglobin 9.9; Platelets 209 07/14/2018: BUN 29; Creatinine, Ser 1.40; Magnesium 2.2; Potassium 4.7; Sodium 138   Lipid Panel    Component Value Date/Time   CHOL  05/14/2008 0435    146        ATP III CLASSIFICATION:  <200     mg/dL   Desirable  200-239  mg/dL   Borderline High  >=240    mg/dL   High   TRIG 105 05/14/2008 0435   HDL 27 (L) 05/14/2008 0435   CHOLHDL 5.4 05/14/2008 0435   VLDL 21 05/14/2008 0435   LDLCALC  05/14/2008 0435    98        Total Cholesterol/HDL:CHD Risk Coronary Heart Disease Risk Table                     Men   Women  1/2 Average Risk   3.4   3.3    Additional studies/ records that were reviewed today include: Summarized above.   ASSESSMENT & PLAN:   1. Shortness of breath/hypoxia - etiology not currently clear; lungs sound relatively clear. She is maintaining NSR on current regimen. Her morbid obesity makes volume assessment challenging but there is no orthopnea or PND. I spoke with Dr. Angelena Form about the patient's severe dyspnea and hypoxia. We both agree there may be another process at play that needs acute investigation. Particularly concerning is her persistent dry cough since October of this year. This raises the question whether CT angio should be considered to r/o PE or other acute lung finding given her hypoxia. We have recommended she proceed to the ER for further medical evaluation of her hypoxia. 2. Peristent atrial fib - maintaining NSR. She is actually slightly bradycardic. Consideration could be  given to AVN blocking agents. She has a lot of cost concerns but I am not a fan of short-acting diltiazem as it typically has to be given at least 3x a day, if not 4x a day. She is currently only on this BID, and not in the SR form. I would be surprised if the generic ER version would be cost-prohibitive. She has never had this sent to her pharmacy to find out - it has only been speculative. She will need ischemic evaluation at some point given recent flecainide initiation; hospital workup may dictate timing contingent on pulmonary eval. Addendum: pt had metoprolol stopped in ER. 3. Recent anemia - suggest recheck upon ER visit. 4. HTN - BP low normal, consider decreasing diltiazem as above. 5. Renal insufficiency  - suggest recheck upon ER visit. This seems relatively new. Patient denies any NSAID use. 6. Chest pain - was pending OP cardiac CT but this is on hold pending acute evaluation for hypoxia as above. She may need ischemic evaluation in the hospital if her pulmonary workup is otherwise totally unrevealing.  Disposition: F/u back in our clinic in 1 week for post-hospital visit and reassess plan for the above.   Medication Adjustments/Labs and Tests Ordered: Current medicines are reviewed at length with the patient today.  Concerns regarding medicines are outlined above. Medication changes, Labs and Tests ordered today are summarized above and listed in the Patient Instructions accessible in Encounters.   Signed, Charlie Pitter, PA-C  07/16/2018 12:40  PM    [facility] [place], [place], [place]  [address] Phone: [phone]; Fax: [phone]

## 2018-07-16 ENCOUNTER — Encounter: Payer: Self-pay | Admitting: Physician Assistant

## 2018-07-16 ENCOUNTER — Ambulatory Visit (INDEPENDENT_AMBULATORY_CARE_PROVIDER_SITE_OTHER): Payer: Medicare Other | Admitting: Physician Assistant

## 2018-07-16 ENCOUNTER — Encounter (HOSPITAL_COMMUNITY): Payer: Self-pay | Admitting: Emergency Medicine

## 2018-07-16 ENCOUNTER — Emergency Department (HOSPITAL_COMMUNITY): Payer: Medicare Other

## 2018-07-16 ENCOUNTER — Emergency Department (HOSPITAL_COMMUNITY)
Admission: EM | Admit: 2018-07-16 | Discharge: 2018-07-16 | Disposition: A | Discharge status: Home / Self Care | Payer: Medicare Other | Attending: Emergency Medicine | Admitting: Emergency Medicine

## 2018-07-16 VITALS — BP 102/50 | HR 51 | Ht 65.0 in

## 2018-07-16 DIAGNOSIS — I10 Essential (primary) hypertension: Secondary | ICD-10-CM

## 2018-07-16 DIAGNOSIS — I4819 Other persistent atrial fibrillation: Secondary | ICD-10-CM

## 2018-07-16 DIAGNOSIS — R079 Chest pain, unspecified: Secondary | ICD-10-CM | POA: Diagnosis not present

## 2018-07-16 DIAGNOSIS — Z79899 Other long term (current) drug therapy: Secondary | ICD-10-CM | POA: Insufficient documentation

## 2018-07-16 DIAGNOSIS — Z87891 Personal history of nicotine dependence: Secondary | ICD-10-CM | POA: Insufficient documentation

## 2018-07-16 DIAGNOSIS — R0602 Shortness of breath: Secondary | ICD-10-CM | POA: Diagnosis not present

## 2018-07-16 DIAGNOSIS — R0902 Hypoxemia: Secondary | ICD-10-CM | POA: Diagnosis not present

## 2018-07-16 DIAGNOSIS — D649 Anemia, unspecified: Secondary | ICD-10-CM

## 2018-07-16 DIAGNOSIS — R0600 Dyspnea, unspecified: Secondary | ICD-10-CM

## 2018-07-16 DIAGNOSIS — Z7901 Long term (current) use of anticoagulants: Secondary | ICD-10-CM | POA: Diagnosis not present

## 2018-07-16 DIAGNOSIS — N289 Disorder of kidney and ureter, unspecified: Secondary | ICD-10-CM

## 2018-07-16 DIAGNOSIS — E119 Type 2 diabetes mellitus without complications: Secondary | ICD-10-CM | POA: Diagnosis not present

## 2018-07-16 LAB — BRAIN NATRIURETIC PEPTIDE: B Natriuretic Peptide: 108 pg/mL — ABNORMAL HIGH (ref 0.0–100.0)

## 2018-07-16 LAB — BASIC METABOLIC PANEL
Anion gap: 9 (ref 5–15)
BUN: 24 mg/dL — ABNORMAL HIGH (ref 8–23)
CO2: 21 mmol/L — ABNORMAL LOW (ref 22–32)
Calcium: 9.1 mg/dL (ref 8.9–10.3)
Chloride: 107 mmol/L (ref 98–111)
Creatinine, Ser: 1.39 mg/dL — ABNORMAL HIGH (ref 0.44–1.00)
GFR calc Af Amer: 43 mL/min — ABNORMAL LOW (ref >60)
GFR calc non Af Amer: 37 mL/min — ABNORMAL LOW (ref >60)
Glucose, Bld: 158 mg/dL — ABNORMAL HIGH (ref 70–99)
Potassium: 4.7 mmol/L (ref 3.5–5.1)
Sodium: 137 mmol/L (ref 135–145)

## 2018-07-16 LAB — CBC WITH DIFFERENTIAL/PLATELET
Abs Immature Granulocytes: 0.05 10*3/uL (ref 0.00–0.07)
Basophils Absolute: 0.1 10*3/uL (ref 0.0–0.1)
Basophils Relative: 1 %
Eosinophils Absolute: 0.1 10*3/uL (ref 0.0–0.5)
Eosinophils Relative: 1 %
HCT: 36.5 % (ref 36.0–46.0)
Hemoglobin: 9.7 g/dL — ABNORMAL LOW (ref 12.0–15.0)
Immature Granulocytes: 1 %
Lymphocytes Relative: 16 %
Lymphs Abs: 1.7 10*3/uL (ref 0.7–4.0)
MCH: 16.7 pg — ABNORMAL LOW (ref 26.0–34.0)
MCHC: 26.6 g/dL — ABNORMAL LOW (ref 30.0–36.0)
MCV: 62.9 fL — ABNORMAL LOW (ref 80.0–100.0)
Monocytes Absolute: 0.8 10*3/uL (ref 0.1–1.0)
Monocytes Relative: 7 %
Neutro Abs: 8.1 10*3/uL — ABNORMAL HIGH (ref 1.7–7.7)
Neutrophils Relative %: 74 %
Platelets: 232 10*3/uL (ref 150–400)
RBC: 5.8 MIL/uL — ABNORMAL HIGH (ref 3.87–5.11)
RDW: 19.4 % — ABNORMAL HIGH (ref 11.5–15.5)
WBC: 10.8 10*3/uL — ABNORMAL HIGH (ref 4.0–10.5)
nRBC: 0 % (ref 0.0–0.2)

## 2018-07-16 LAB — I-STAT TROPONIN, ED: Troponin i, poc: 0.01 ng/mL (ref 0.00–0.08)

## 2018-07-16 LAB — PROTIME-INR
INR: 2.78
Prothrombin Time: 28.9 seconds — ABNORMAL HIGH (ref 11.4–15.2)

## 2018-07-16 MED ORDER — IOPAMIDOL (ISOVUE-370) INJECTION 76%
INTRAVENOUS | Status: AC
  Filled 2018-07-16: qty 100

## 2018-07-16 MED ORDER — IOPAMIDOL (ISOVUE-370) INJECTION 76%
75.0000 mL | Freq: Once | INTRAVENOUS | Status: AC | PRN
  Administered 2018-07-16: 14:00:00 via INTRAVENOUS

## 2018-07-16 NOTE — ED Notes (Addendum)
Pt ambulated to restroom with steady gait on pulse ox. Pt was constant at 98% SpO2.  Tolerated well and denied SOB . Pt returned to bed with MD at bedside

## 2018-07-16 NOTE — Patient Instructions (Addendum)
YOU HAVE BEEN ADVISED PER Russell Quinney, PAC TO GO TO [place] ED TODAY DX SOB.  YOU HAVE BEEN SCHEDULED TO SEE [place], PAC 07/26/18 @ 11 AM    While you were in the office you reported extreme shortness of breath with exertion this morning. We walked you in clinic and your oxygen level dropped to 74%. You will require further workup to evaluate for worsening anemia, infection, fluid buildup and potentially a scan to rule out a blood clot in your lungs. Your heart rhythm is normal so this does not appear to be related to your atrial fibrillation.

## 2018-07-16 NOTE — ED Notes (Signed)
Patient transported to CT 

## 2018-07-16 NOTE — ED Notes (Signed)
Pt verbalized understanding of discharge paperwork and follow-up care.  °

## 2018-07-16 NOTE — ED Provider Notes (Signed)
[facility] EMERGENCY DEPARTMENT Provider Note   CSN: [id number] Arrival date & time: 07/16/18  1212     History   Chief Complaint Chief Complaint  Patient presents with  . Shortness of Breath    HPI Jenna Mcdonald is a 72 y.o. female.  Pt presents to the ED today with SOB.  The pt has a hx of afib and has been on coumadin.  She was admitted 07/02/18 for afib with rvr with anemia and +guaiac stool.  She was seen by Dr. Watt Climes (GI) who did an EGD and colonoscopy on 11/12.  The EGD showed multiple nonbleeding duodenal ulcers.  Colonoscopy showed diverticulosis and multiple colonic angiodysplastic lesions treated with argon plasma.  She underwent TEE cardioversion on 11/13 to nsr.  She thinks they maintained her coumadin throughout the hospital stay.  The pt was d/c and was readmitted on 11/18 back in afib with rvr.  She was cardioverted again on 11/20 and was d/c home.  She followed up at the cardiology clinic today and was very sob when she arrived.  She felt normal this morning, but on the drive to the office, she became sob.  O2 sat dropped to 74% with ambulation.           Past Medical History:  Diagnosis Date  . Anemia   . Aortic stenosis    mild by echo 08/2016, not mentioned in 06/2018 TEE  . Breast CA ([place])    left  . Carotid stenosis    1-39% left  . Degenerative disc disease, lumbar    of the knees  . Diabetes mellitus   . 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[place])   . Fatigue    Severe secondary to to gemfibrozil  . Gastroesophageal reflux disease    denies  . GI bleeding    a. 06/2018: EGD showed multiple nonbleeding duodenal ulcers. 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  . Hammertoe    left second toe  . Hypercholesteremia   . Hypertension   . Insomnia   . Irritable bowel   . Low back pain   . Metabolic syndrome   . Mild mitral regurgitation 06/2018  . Morbid obesity ([place])   .  OSA on CPAP \   bipap  . Osteoarthritis   . Osteopenia   . Persistent atrial fibrillation   . Personal history of radiation therapy   . Pneumonia   . PONV (postoperative nausea and vomiting)   . Sigmoid diverticulosis   . Thalassemia minor   . Thalassemia minor   . Vitamin D deficiency     Patient Active Problem List   Diagnosis Date Noted  . Anemia   . Heme positive stool   . Atrial fibrillation with RVR ([place]) 07/04/2018  . Atrial fibrillation ([place]) 07/02/2018  . Carotid stenosis   . Bruit of left carotid artery 09/22/2017  . Aortic stenosis   . Heart murmur 09/09/2016  . Morbid obesity ([place]) 02/05/2016  . Dry eye 05/17/2015  . Type 2 diabetes mellitus without complication ([place]) 32/35/5732  . Breast cancer, left breast ([place]) 07/22/2014  . Obesity, Class III, BMI 40-49.9 (morbid obesity) ([place]) 07/06/2014  . 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 History of surgical procedure 05/17/2014  . OSA (obstructive sleep apnea) 11/17/2013  . S/P lumbar spinal fusion 09/22/2013  . Persistent atrial fibrillation ([place]) 09/11/2011    Class: Acute  . 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[place]) 09/11/2011    Class: Chronic  .  Essential hypertension 09/11/2011  . Exogenous obesity 09/11/2011  . Gastroesophageal reflux disease 09/11/2011  . Thalassemia minor 09/11/2011    Class: Chronic  . Breast cancer ([place]) 09/11/2011    Class: Chronic  . Degenerative joint disease 09/11/2011    Class: Chronic  . Irritable bowel disease 09/11/2011    Class: Chronic  . Lumbar disc disease 09/11/2011    Past Surgical History:  Procedure Laterality Date  . BIOPSY  07/06/2018   Procedure: BIOPSY;  Surgeon: Clarene Essex, MD;  Location: [facility];  Service: Endoscopy;;  . BREAST LUMPECTOMY Left 05/28/2009  . BREAST SURGERY Left 10   lumpectomy  . CARDIOVERSION N/A 07/07/2018   Procedure: CARDIOVERSION;  Surgeon: [name], MD;  Location: [facility] ENDOSCOPY;  Service: Cardiovascular;  Laterality: N/A;  .  CARDIOVERSION N/A 07/14/2018   Procedure: CARDIOVERSION;  Surgeon: Larey Dresser, MD;  Location[facility] ENDOSCOPY;  Service: Cardiovascular;  Laterality: N/A;  . CESAREAN SECTION    . COLONOSCOPY WITH PROPOFOL N/A 07/06/2018   Procedure: COLONOSCOPY WITH PROPOFOL;  Surgeon: Clarene Essex, MD;  Location: [facility];  Service: Endoscopy;  Laterality: N/A;  . CYSTECTOMY  70   pilonidal   . DILATION AND CURETTAGE OF UTERUS    . ESOPHAGOGASTRODUODENOSCOPY (EGD) WITH PROPOFOL N/A 07/06/2018   Procedure: ESOPHAGOGASTRODUODENOSCOPY (EGD) WITH PROPOFOL;  Surgeon: Clarene Essex, MD;  Location: [facility];  Service: Endoscopy;  Laterality: N/A;  . EYE SURGERY Bilateral 12/14   cataracts  . HOT HEMOSTASIS N/A 07/06/2018   Procedure: HOT HEMOSTASIS (ARGON PLASMA COAGULATION/BICAP);  Surgeon: Clarene Essex, MD;  Location: [facility];  Service: Endoscopy;  Laterality: N/A;  . MAXIMUM ACCESS (MAS)POSTERIOR LUMBAR INTERBODY FUSION (PLIF) 2 LEVEL N/A 09/22/2013   Procedure: FOR MAXIMUM ACCESS  POSTERIOR LUMBAR INTERBODY FUSION LUMBAR THREE-FOUR FOUR-FIVE;  Surgeon: Eustace Moore, MD;  Location: [facility] NEURO ORS;  Service: Neurosurgery;  Laterality: N/A;  . OOPHORECTOMY     wedge section not rem  . 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 REPLACEMENT TOTAL KNEE    . TEE WITHOUT CARDIOVERSION N/A 07/07/2018   Procedure: TRANSESOPHAGEAL ECHOCARDIOGRAM (TEE);  Surgeon: [name], MD;  Location: [facility];  Service: Cardiovascular;  Laterality: N/A;  . TONSILLECTOMY    . TUBAL LIGATION       OB History   None      Home Medications    Prior to Admission medications   Medication Sig Start Date End Date Taking? Authorizing Provider  acetaminophen (TYLENOL) 500 MG tablet Take 500 mg by mouth every 6 (six) hours as needed for mild pain. For back pain    [provider]  Biotin 10 MG TABS Take 10 mg by mouth 2 (two) times daily.    [provider]  Calcium Carbonate-Vitamin D (CALCIUM 600 +  D PO) Take 1 tablet by mouth daily.     [provider]  diltiazem (CARDIZEM) 90 MG tablet Take 1 tablet (90 mg total) by mouth 2 (two) times daily. 07/14/18 08/13/18  Chundi, Verne Spurr, MD  diphenhydramine-acetaminophen (TYLENOL PM) 25-500 MG TABS tablet Take 1 tablet by mouth at bedtime.    [provider]  flecainide (TAMBOCOR) 100 MG tablet Take 1 tablet (100 mg total) by mouth every 12 (twelve) hours. 07/14/18 10/12/18  Chundi, Verne Spurr, MD  glimepiride (AMARYL) 4 MG tablet Take 2 mg by mouth daily.  10/29/15   [provider]  metFORMIN (GLUCOPHAGE-XR) 500 MG 24 hr tablet Take 1 tablet by mouth 2 (two) times daily. 10/31/14  [provider]  metoprolol tartrate (LOPRESSOR) 25 MG tablet Take 1 tablet (25 mg total) by mouth 2 (two) times daily. 07/07/18   Almyra Deforest, PA  Multiple Vitamin (MULITIVITAMIN WITH MINERALS) TABS Take 1 tablet by mouth daily.    [provider]  pantoprazole (PROTONIX) 40 MG tablet Take 1 tablet (40 mg total) by mouth daily. 07/08/18   Almyra Deforest, PA  warfarin (COUMADIN) 5 MG tablet Take 5 mg by mouth See admin instructions. 7.5 mg  Thursday. 5mg  all the other days    [provider]    Family History Family History  Problem Relation Age of Onset  . Anesthesia problems Mother   . Diabetes Mother   . Diabetes Father     Social History Social History   Tobacco Use  . Smoking status: Former Smoker    Packs/day: 0.50    Years: 10.00    Pack years: 5.00    Types: Cigarettes    Last attempt to quit: 11/12/2003    Years since quitting: 14.6  . Smokeless tobacco: Never Used  Substance Use Topics  . Alcohol use: Yes    Comment: occasionally  . Drug use: No     Allergies   Aromasin [exemestane]; Femara [letrozole]; Gemfibrozil; Hydrocodone-acetaminophen; Letrozole; Metoprolol; Other; Tamoxifen; Tetracycline; Tetracyclines & related; Toprol xl [metoprolol tartrate]; Verapamil; and Vicodin  [hydrocodone-acetaminophen]   Review of Systems Review of Systems  Respiratory: Positive for shortness of breath.   Neurological: Positive for weakness.     Physical Exam Updated Vital Signs BP (!) 153/74   Pulse 62   Resp 19   Ht 5\' 5"  (1.651 m)   Wt 109 kg   SpO2 97%   BMI 39.99 kg/m   Physical Exam  Constitutional: She is oriented to person, place, and time. She appears well-developed and well-nourished.  HENT:  Head: Normocephalic and atraumatic.  Mouth/Throat: Oropharynx is clear and moist.  Eyes: Pupils are equal, round, and reactive to light. EOM are normal.  Neck: Normal range of motion. Neck supple.  Cardiovascular: Normal rate and regular rhythm.  Pulmonary/Chest: Effort normal and breath sounds normal.  Abdominal: Soft. Bowel sounds are normal.  Musculoskeletal: Normal range of motion.       Right lower leg: Normal.       Left lower leg: Normal.  Neurological: She is alert and oriented to person, place, and time.  Skin: Skin is warm and dry. Capillary refill takes less than 2 seconds.  Psychiatric: She has a normal mood and affect. Her behavior is normal.  Nursing note and vitals reviewed.    ED Treatments / Results  Labs (all labs ordered are listed, but only abnormal results are displayed) Labs Reviewed  BASIC METABOLIC PANEL - Abnormal; Notable for the following components:      Result Value   CO2 21 (*)    Glucose, Bld 158 (*)    BUN 24 (*)    Creatinine, Ser 1.39 (*)    GFR calc non Af Amer 37 (*)    GFR calc Af Amer 43 (*)    All other components within normal limits  CBC WITH DIFFERENTIAL/PLATELET - Abnormal; Notable for the following components:   WBC 10.8 (*)    RBC 5.80 (*)    Hemoglobin 9.7 (*)    MCV 62.9 (*)    MCH 16.7 (*)    MCHC 26.6 (*)    RDW 19.4 (*)    Neutro Abs 8.1 (*)    All other  components within normal limits  BRAIN NATRIURETIC PEPTIDE - Abnormal; Notable for the following components:   B Natriuretic Peptide 108.0  (*)    All other components within normal limits  PROTIME-INR - Abnormal; Notable for the following components:   Prothrombin Time 28.9 (*)    All other components within normal limits  I-STAT TROPONIN, ED    EKG EKG Interpretation  Date/Time:  Friday July 16 2018 12:25:35 EST Ventricular Rate:  53 PR Interval:    QRS Duration: 100 QT Interval:  451 QTC Calculation: 424 R Axis:   84 Text Interpretation:  Sinus rhythm Left atrial enlargement Borderline right axis deviation No significant change since last tracing Confirmed by Isla Pence [phone]) on 07/16/2018 12:33:03 PM   Radiology Ct Angio Chest Pe W And/or Wo Contrast  Result Date: 07/16/2018 CLINICAL DATA:  Shortness of breath. EXAM: CT ANGIOGRAPHY CHEST WITH CONTRAST TECHNIQUE: Multidetector CT imaging of the chest was performed using the standard protocol during bolus administration of intravenous contrast. Multiplanar CT image reconstructions and MIPs were obtained to evaluate the vascular anatomy. CONTRAST:  75 mL ISOVUE-370 IOPAMIDOL (ISOVUE-370) INJECTION 76% COMPARISON:  Radiograph of July 16, 2018. FINDINGS: Cardiovascular: Satisfactory opacification of the pulmonary arteries to the segmental level. No evidence of pulmonary embolism. Normal heart size. No pericardial effusion. Atherosclerosis of thoracic aorta is noted. Mediastinum/Nodes: No enlarged mediastinal, hilar, or axillary lymph nodes. Thyroid gland, trachea, and esophagus demonstrate no significant findings. Lungs/Pleura: Lungs are clear. No pleural effusion or pneumothorax. Upper Abdomen: No acute abnormality. Musculoskeletal: No chest wall abnormality. No acute or significant osseous findings. Review of the MIP images confirms the above findings. IMPRESSION: No definite evidence of pulmonary embolus. No acute abnormality seen in the chest. Aortic Atherosclerosis (ICD10-I70.0). Electronically Signed   By: Marijo Conception, M.D.   On: 07/16/2018 14:17   Dg  Chest Port 1 View  Result Date: 07/16/2018 CLINICAL DATA:  Shortness of breath EXAM: PORTABLE CHEST 1 VIEW COMPARISON:  07/02/2018 FINDINGS: Cardiomegaly accentuated by low volumes. Stable interstitial coarsening. Artifact from EKG leads. There is no edema, consolidation, effusion, or pneumothorax. IMPRESSION: No acute finding. Electronically Signed   By: Monte Fantasia M.D.   On: 07/16/2018 12:50    Procedures Procedures (including critical care time)  Medications Ordered in ED Medications  iopamidol (ISOVUE-370) 76 % injection 75 mL ( Intravenous Contrast Given 07/16/18 1347)     Initial Impression / Assessment and Plan / ED Course  I have reviewed the triage vital signs and the nursing notes.  Pertinent labs & imaging results that were available during my care of the patient were reviewed by me and considered in my medical decision making (see chart for details).    Pt is feeling better.  No PE.  No PNA.  Her pulse ox with ambulation is 98%.  Her hgb is stable from prior.  INR is 2.78.  She is not in afib.  Pt d/w Dr. Ellyn Hack (cardiology) who recommends stopping metoprolol.    Pt is stable for d/c.  Return if worse.    Final Clinical Impressions(s) / ED Diagnoses   Final diagnoses:  Dyspnea, unspecified type    ED Discharge Orders    None       Isla Pence, MD 07/16/18 1626

## 2018-07-16 NOTE — ED Triage Notes (Signed)
Pt reports her PCP due to SOB with exertion. Pt states while she was sitting, her O2 sats were in the 90s but dropped to the 70s when ambulating at the office. Pt reports being here 3 times in the last week due to her A-fib and chest pain. Denies any today.

## 2018-07-16 NOTE — Discharge Instructions (Addendum)
Stop metoprolol

## 2018-07-19 ENCOUNTER — Telehealth: Payer: Self-pay | Admitting: Physician Assistant

## 2018-07-19 NOTE — Telephone Encounter (Signed)
I called patient over the weekend to check up on her since we had to send her to the ER on Friday - I did not have access to make a phone note at that time as I was using [employer] app on the phone. On Saturday she was feeling significantly better off the metoprolol. No further CP or SOB. HR was in the 70s and BP 614E systolic. She didn't recall being given a f/u appointment on 07/26/18. I told her the office would call her on Monday to see how she was feeling and to give her the appt info - Anderson Malta, please reiterate to her the appt date and time. She thanked me for checking up on her and for our office for really looking after her.  I told her I would also touch base with MD about plan for cardiac CTA since she was feeling so much better - this was pending from last dc but not yet scheduled. She has some renal insufficiency that needs to be taken into account, but had a CT angio in the hospital recently. She definitely will need some ischemic eval given initation of flecainide.  I will forward this question to Dr. Ellyn Hack since he helped participate in this patient's care this week and was involved in ER decision making. Dr. Ellyn Hack, are we OK to continue to schedule the cardiac CT as planned? We definitely will get a pre-CT BMET given renal insufficiency. Recent Cr 1.4. [name] was very appreciate of your care.  Jenna Jeffries PA-C

## 2018-07-19 NOTE — Telephone Encounter (Signed)
Jenna Mcdonald - let's go ahead and get the ball rolling to make sure CT is in process of being scheduled then. Hao placed the order 07/07/18 but the patient hadn't received any information about this yet. Patient will also need instructions for CT scan. Her metoprolol was recently stopped due to low HR and fatigue so would not give her any extra metoprolol the day of the test. She should take her diltiazem that day as ordered. Will need instructions on her diabetic meds as well. Needs repeat BMET before CT. Thanks.  Dayna Dunn PA-C

## 2018-07-19 NOTE — Telephone Encounter (Signed)
Spoke with pt re: message below. Pt states that she is feeling very well. She has been reminded of her appt 07/26/18 @ 11:00 with the recommendation to arrive 15 mins early for registration. Pt has been made aware that she will be made aware of the CT as soon as we hear back from Dr. Ellyn Hack re: CT. Pt verbalized understanding and thanked me for the call.

## 2018-07-19 NOTE — Telephone Encounter (Signed)
So I did not order the cardiac CTA.  This is ordered before I saw her.  I think the plan was to go and proceed with that just to make sure that were okay using flecainide going forward. As far as her ER visit, I simply told her to stop the metoprolol and just stay on the diltiazem.  She will use metoprolol as a as needed for breakthrough A. Fib.  [name], MD

## 2018-07-20 ENCOUNTER — Inpatient Hospital Stay (HOSPITAL_COMMUNITY)
Admission: EM | Admit: 2018-07-20 | Discharge: 2018-07-23 | DRG: 309 | Disposition: A | Discharge status: Home / Self Care | Payer: Medicare Other | Attending: Internal Medicine | Admitting: Internal Medicine

## 2018-07-20 ENCOUNTER — Other Ambulatory Visit: Payer: Self-pay

## 2018-07-20 ENCOUNTER — Encounter (HOSPITAL_COMMUNITY): Payer: Self-pay | Admitting: *Deleted

## 2018-07-20 DIAGNOSIS — M5136 Other intervertebral disc degeneration, lumbar region: Secondary | ICD-10-CM | POA: Diagnosis present

## 2018-07-20 DIAGNOSIS — Z9989 Dependence on other enabling machines and devices: Secondary | ICD-10-CM

## 2018-07-20 DIAGNOSIS — E1122 Type 2 diabetes mellitus with diabetic chronic kidney disease: Secondary | ICD-10-CM | POA: Diagnosis present

## 2018-07-20 DIAGNOSIS — N183 Chronic kidney disease, stage 3 unspecified: Secondary | ICD-10-CM | POA: Diagnosis present

## 2018-07-20 DIAGNOSIS — I4891 Unspecified atrial fibrillation: Secondary | ICD-10-CM | POA: Diagnosis present

## 2018-07-20 DIAGNOSIS — Z7901 Long term (current) use of anticoagulants: Secondary | ICD-10-CM

## 2018-07-20 DIAGNOSIS — Z8719 Personal history of other diseases of the digestive system: Secondary | ICD-10-CM

## 2018-07-20 DIAGNOSIS — Z881 Allergy status to other antibiotic agents status: Secondary | ICD-10-CM

## 2018-07-20 DIAGNOSIS — D631 Anemia in chronic kidney disease: Secondary | ICD-10-CM | POA: Diagnosis present

## 2018-07-20 DIAGNOSIS — R402360 Coma scale, best motor response, obeys commands, unspecified time: Secondary | ICD-10-CM | POA: Diagnosis present

## 2018-07-20 DIAGNOSIS — I129 Hypertensive chronic kidney disease with stage 1 through stage 4 chronic kidney disease, or unspecified chronic kidney disease: Secondary | ICD-10-CM | POA: Diagnosis present

## 2018-07-20 DIAGNOSIS — N289 Disorder of kidney and ureter, unspecified: Secondary | ICD-10-CM

## 2018-07-20 DIAGNOSIS — M8580 Other specified disorders of bone density and structure, unspecified site: Secondary | ICD-10-CM | POA: Diagnosis present

## 2018-07-20 DIAGNOSIS — D509 Iron deficiency anemia, unspecified: Secondary | ICD-10-CM

## 2018-07-20 DIAGNOSIS — E119 Type 2 diabetes mellitus without complications: Secondary | ICD-10-CM

## 2018-07-20 DIAGNOSIS — R0602 Shortness of breath: Secondary | ICD-10-CM

## 2018-07-20 DIAGNOSIS — Z79899 Other long term (current) drug therapy: Secondary | ICD-10-CM

## 2018-07-20 DIAGNOSIS — Z7984 Long term (current) use of oral hypoglycemic drugs: Secondary | ICD-10-CM

## 2018-07-20 DIAGNOSIS — Z981 Arthrodesis status: Secondary | ICD-10-CM

## 2018-07-20 DIAGNOSIS — Z886 Allergy status to analgesic agent status: Secondary | ICD-10-CM

## 2018-07-20 DIAGNOSIS — Z853 Personal history of malignant neoplasm of breast: Secondary | ICD-10-CM

## 2018-07-20 DIAGNOSIS — G8929 Other chronic pain: Secondary | ICD-10-CM | POA: Diagnosis present

## 2018-07-20 DIAGNOSIS — Z888 Allergy status to other drugs, medicaments and biological substances status: Secondary | ICD-10-CM

## 2018-07-20 DIAGNOSIS — D563 Thalassemia minor: Secondary | ICD-10-CM | POA: Diagnosis present

## 2018-07-20 DIAGNOSIS — Z96652 Presence of left artificial knee joint: Secondary | ICD-10-CM | POA: Diagnosis present

## 2018-07-20 DIAGNOSIS — I4819 Other persistent atrial fibrillation: Secondary | ICD-10-CM | POA: Diagnosis not present

## 2018-07-20 DIAGNOSIS — D649 Anemia, unspecified: Secondary | ICD-10-CM | POA: Diagnosis present

## 2018-07-20 DIAGNOSIS — Z885 Allergy status to narcotic agent status: Secondary | ICD-10-CM

## 2018-07-20 DIAGNOSIS — Z923 Personal history of irradiation: Secondary | ICD-10-CM

## 2018-07-20 DIAGNOSIS — I080 Rheumatic disorders of both mitral and aortic valves: Secondary | ICD-10-CM | POA: Diagnosis present

## 2018-07-20 DIAGNOSIS — R402140 Coma scale, eyes open, spontaneous, unspecified time: Secondary | ICD-10-CM | POA: Diagnosis present

## 2018-07-20 DIAGNOSIS — Z87891 Personal history of nicotine dependence: Secondary | ICD-10-CM

## 2018-07-20 DIAGNOSIS — R402250 Coma scale, best verbal response, oriented, unspecified time: Secondary | ICD-10-CM | POA: Diagnosis present

## 2018-07-20 DIAGNOSIS — Z6841 Body Mass Index (BMI) 40.0 and over, adult: Secondary | ICD-10-CM

## 2018-07-20 DIAGNOSIS — Z8711 Personal history of peptic ulcer disease: Secondary | ICD-10-CM

## 2018-07-20 DIAGNOSIS — G4733 Obstructive sleep apnea (adult) (pediatric): Secondary | ICD-10-CM | POA: Diagnosis present

## 2018-07-20 DIAGNOSIS — I6522 Occlusion and stenosis of left carotid artery: Secondary | ICD-10-CM | POA: Diagnosis present

## 2018-07-20 DIAGNOSIS — M549 Dorsalgia, unspecified: Secondary | ICD-10-CM | POA: Diagnosis present

## 2018-07-20 NOTE — ED Provider Notes (Signed)
[facility] EMERGENCY DEPARTMENT Provider Note   CSN: [id number] Arrival date & time: 07/20/18  2304     History   Chief Complaint Chief Complaint  Patient presents with  . Atrial Fibrillation    HPI Jenna Mcdonald is a 72 y.o. female.  The history is provided by the patient.  She has history of hypertension, hyperlipidemia, diabetes, thalassemia minor, persistent atrial fibrillation and comes in because of rapid heartbeat which started 6 PM.  She has been admitted to the hospital twice in the last several weeks with atrial fibrillation.  Following the second admission, she had an ED visit for dyspnea and was taken off of metoprolol.  She called her cardiologist who advised her to take an extra dose of 1/2 tablet of diltiazem and come to the ED if heart rate did not go back down to normal.  She denies chest pain, heaviness, tightness, pressure.  She denies dyspnea.  She states that she has been compliant with her medications including diltiazem and flecainide.  Past Medical History:  Diagnosis Date  . Anemia   . Aortic stenosis    mild by echo 08/2016, not mentioned in 06/2018 TEE  . Breast CA ([place])    left  . Carotid stenosis    1-39% left  . Degenerative disc disease, lumbar    of the knees  . Diabetes mellitus   . 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[place])   . Fatigue    Severe secondary to to gemfibrozil  . Gastroesophageal reflux disease    denies  . GI bleeding    a. 06/2018: EGD showed multiple nonbleeding duodenal ulcers. 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  . Hammertoe    left second toe  . Hypercholesteremia   . Hypertension   . Insomnia   . Irritable bowel   . Low back pain   . Metabolic syndrome   . Mild mitral regurgitation 06/2018  . Morbid obesity ([place])   . OSA on CPAP \   bipap  . Osteoarthritis   . Osteopenia   . Persistent atrial fibrillation   . Personal history of  radiation therapy   . Pneumonia   . PONV (postoperative nausea and vomiting)   . Sigmoid diverticulosis   . Thalassemia minor   . Thalassemia minor   . Vitamin D deficiency     Patient Active Problem List   Diagnosis Date Noted  . Anemia   . Heme positive stool   . Atrial fibrillation with RVR ([place]) 07/04/2018  . Atrial fibrillation ([place]) 07/02/2018  . Carotid stenosis   . Bruit of left carotid artery 09/22/2017  . Aortic stenosis   . Heart murmur 09/09/2016  . Morbid obesity ([place]) 02/05/2016  . Dry eye 05/17/2015  . Type 2 diabetes mellitus without complication ([place]) 62/56/3893  . Breast cancer, left breast ([place]) 07/22/2014  . Obesity, Class III, BMI 40-49.9 (morbid obesity) ([place]) 07/06/2014  . 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 History of surgical procedure 05/17/2014  . OSA (obstructive sleep apnea) 11/17/2013  . S/P lumbar spinal fusion 09/22/2013  . Persistent atrial fibrillation ([place]) 09/11/2011    Class: Acute  . 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[place]) 09/11/2011    Class: Chronic  . Essential hypertension 09/11/2011  . Exogenous obesity 09/11/2011  . Gastroesophageal reflux disease 09/11/2011  . Thalassemia minor 09/11/2011    Class: Chronic  . Breast cancer ([place]) 09/11/2011    Class: Chronic  . Degenerative joint disease 09/11/2011  Class: Chronic  . Irritable bowel disease 09/11/2011    Class: Chronic  . Lumbar disc disease 09/11/2011    Past Surgical History:  Procedure Laterality Date  . BIOPSY  07/06/2018   Procedure: BIOPSY;  Surgeon: Clarene Essex, MD;  Location: [facility];  Service: Endoscopy;;  . BREAST LUMPECTOMY Left 05/28/2009  . BREAST SURGERY Left 10   lumpectomy  . CARDIOVERSION N/A 07/07/2018   Procedure: CARDIOVERSION;  Surgeon: [name], MD;  Location[facility];  Service: Cardiovascular;  Laterality: N/A;  . CARDIOVERSION N/A 07/14/2018   Procedure: CARDIOVERSION;  Surgeon: Larey Dresser, MD;  [facility] ENDOSCOPY;  Service:  Cardiovascular;  Laterality: N/A;  . CESAREAN SECTION    . COLONOSCOPY WITH PROPOFOL N/A 07/06/2018   Procedure: COLONOSCOPY WITH PROPOFOL;  Surgeon: Clarene Essex, MD;  Location: [facility];  Service: Endoscopy;  Laterality: N/A;  . CYSTECTOMY  70   pilonidal   . DILATION AND CURETTAGE OF UTERUS    . ESOPHAGOGASTRODUODENOSCOPY (EGD) WITH PROPOFOL N/A 07/06/2018   Procedure: ESOPHAGOGASTRODUODENOSCOPY (EGD) WITH PROPOFOL;  Surgeon: Clarene Essex, MD;  Location: [facility];  Service: Endoscopy;  Laterality: N/A;  . EYE SURGERY Bilateral 12/14   cataracts  . HOT HEMOSTASIS N/A 07/06/2018   Procedure: HOT HEMOSTASIS (ARGON PLASMA COAGULATION/BICAP);  Surgeon: Clarene Essex, MD;  Location: [facility];  Service: Endoscopy;  Laterality: N/A;  . MAXIMUM ACCESS (MAS)POSTERIOR LUMBAR INTERBODY FUSION (PLIF) 2 LEVEL N/A 09/22/2013   Procedure: FOR MAXIMUM ACCESS  POSTERIOR LUMBAR INTERBODY FUSION LUMBAR THREE-FOUR FOUR-FIVE;  Surgeon: Eustace Moore, MD;  Location: [facility] NEURO ORS;  Service: Neurosurgery;  Laterality: N/A;  . OOPHORECTOMY     wedge section not rem  . 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 REPLACEMENT TOTAL KNEE    . TEE WITHOUT CARDIOVERSION N/A 07/07/2018   Procedure: TRANSESOPHAGEAL ECHOCARDIOGRAM (TEE);  Surgeon: [name], MD;  Location: [facility];  Service: Cardiovascular;  Laterality: N/A;  . TONSILLECTOMY    . TUBAL LIGATION       OB History   None      Home Medications    Prior to Admission medications   Medication Sig Start Date End Date Taking? Authorizing Provider  acetaminophen (TYLENOL) 500 MG tablet Take 500 mg by mouth every 6 (six) hours as needed for mild pain. For back pain    [provider]  Biotin 10 MG TABS Take 10 mg by mouth 2 (two) times daily.    [provider]  Calcium Carbonate-Vitamin D (CALCIUM 600 + D PO) Take 1 tablet by mouth daily.     [provider]  diltiazem (CARDIZEM) 90 MG tablet Take 1 tablet (90 mg  total) by mouth 2 (two) times daily. 07/14/18 08/13/18  Chundi, Verne Spurr, MD  diphenhydramine-acetaminophen (TYLENOL PM) 25-500 MG TABS tablet Take 1 tablet by mouth at bedtime.    [provider]  flecainide (TAMBOCOR) 100 MG tablet Take 1 tablet (100 mg total) by mouth every 12 (twelve) hours. 07/14/18 10/12/18  Chundi, Verne Spurr, MD  glimepiride (AMARYL) 4 MG tablet Take 2 mg by mouth daily.  10/29/15   [provider]  metFORMIN (GLUCOPHAGE-XR) 500 MG 24 hr tablet Take 1 tablet by mouth 2 (two) times daily. 10/31/14   [provider]  Multiple Vitamin (MULITIVITAMIN WITH MINERALS) TABS Take 1 tablet by mouth daily.    [provider]  pantoprazole (PROTONIX) 40 MG tablet Take 1 tablet (40 mg total) by mouth daily. 07/08/18   Almyra Deforest, [place]  warfarin (COUMADIN) 5 MG tablet Take 5 mg by mouth See admin instructions. 7.5 mg  Thursday. 5mg  all the other days    [provider]    Family History Family History  Problem Relation Age of Onset  . Anesthesia problems Mother   . Diabetes Mother   . Diabetes Father     Social History Social History   Tobacco Use  . Smoking status: Former Smoker    Packs/day: 0.50    Years: 10.00    Pack years: 5.00    Types: Cigarettes    Last attempt to quit: 11/12/2003    Years since quitting: 14.6  . Smokeless tobacco: Never Used  Substance Use Topics  . Alcohol use: Yes    Comment: occasionally  . Drug use: No     Allergies   Aromasin [exemestane]; Femara [letrozole]; Gemfibrozil; Hydrocodone-acetaminophen; Letrozole; Metoprolol; Other; Tamoxifen; Tetracycline; Tetracyclines & related; Toprol xl [metoprolol tartrate]; Verapamil; and Vicodin [hydrocodone-acetaminophen]   Review of Systems Review of Systems  All other systems reviewed and are negative.    Physical Exam Updated Vital Signs BP 120/64 (BP Location: Right Arm)   Pulse (!) 115   Temp 98 F (36.7 C) (Oral)   Resp 16   SpO2 96%   Physical  Exam  Nursing note and vitals reviewed.  73 year old female, resting comfortably and in no acute distress. Vital signs are significant for rapid heart rate. Oxygen saturation is 96%, which is normal. Head is normocephalic and atraumatic. PERRLA, EOMI. Oropharynx is clear. Neck is nontender and supple without adenopathy or JVD. Back is nontender and there is no CVA tenderness. Lungs are clear without rales, wheezes, or rhonchi. Chest is nontender. Heart is tachycardic and irregular with 2/6 systolic ejection murmur heard at the lower left sternal border. Abdomen is soft, flat, nontender without masses or hepatosplenomegaly and peristalsis is normoactive. Extremities have 1+ edema, full range of motion is present. Skin is warm and dry without rash. Neurologic: Mental status is normal, cranial nerves are intact, there are no motor or sensory deficits.  ED Treatments / Results  Labs (all labs ordered are listed, but only abnormal results are displayed) Labs Reviewed  BASIC METABOLIC PANEL - Abnormal; Notable for the following components:      Result Value   CO2 21 (*)    Glucose, Bld 189 (*)    Creatinine, Ser 1.17 (*)    GFR calc non Af Amer 47 (*)    GFR calc Af Amer 54 (*)    All other components within normal limits  CBC WITH DIFFERENTIAL/PLATELET - Abnormal; Notable for the following components:   RBC 5.70 (*)    Hemoglobin 9.8 (*)    HCT 35.5 (*)    MCV 62.3 (*)    MCH 17.2 (*)    MCHC 27.6 (*)    RDW 19.9 (*)    All other components within normal limits  PROTIME-INR - Abnormal; Notable for the following components:   Prothrombin Time 27.2 (*)    All other components within normal limits  I-STAT TROPONIN, ED    EKG EKG Interpretation  Date/Time:  Wednesday July 21 2018 00:10:48 EST Ventricular Rate:  130 PR Interval:    QRS Duration: 86 QT Interval:  290 QTC Calculation: 426 R Axis:   76 Text Interpretation:  Atrial fibrillation with rapid ventricular  response T wave abnormality, consider inferolateral ischemia Abnormal ECG When compared with ECG of 07/16/2018, Atrial fibrillation with rapid ventricular response has replaced  Sinus rhythm T wave abnormality is now present - probably rate-related Confirmed by Delora Fuel ([id number]) on 07/21/2018 12:06:23 AM  Procedures Procedures  CRITICAL CARE Performed by: Delora Fuel Total critical care time: 65 minutes Critical care time was exclusive of separately billable procedures and treating other patients. Critical care was necessary to treat or prevent imminent or life-threatening deterioration. Critical care was time spent personally by me on the following activities: development of treatment plan with patient and/or surrogate as well as nursing, discussions with consultants, evaluation of patient's response to treatment, examination of patient, obtaining history from patient or surrogate, ordering and performing treatments and interventions, ordering and review of laboratory studies, ordering and review of radiographic studies, pulse oximetry and re-evaluation of patient's condition.  Medications Ordered in ED Medications  diltiazem (CARDIZEM) 1 mg/mL load via infusion 10 mg (10 mg Intravenous Bolus from Bag 07/21/18 0046)    And  diltiazem (CARDIZEM) 100 mg in dextrose 5% 197mL (1 mg/mL) infusion (15 mg/hr Intravenous Rate/Dose Change 07/21/18 0154)     Initial Impression / Assessment and Plan / ED Course  I have reviewed the triage vital signs and the nursing notes.  Pertinent labs & imaging results that were available during my care of the patient were reviewed by me and considered in my medical decision making (see chart for details).  Recurrent atrial fibrillation with rapid ventricular response.  She apparently had intolerance to metoprolol, so we will give her additional diltiazem for rate control.  Old records reviewed confirming to recent hospitalizations for recurrent atrial  fibrillation, treated with cardioversion.  Heart rate came down appropriately with diltiazem, but patient continues to have subjective palpitations and feels very uncomfortable.  Case was discussed with Dr. Paticia Stack of cardiology service who felt that if rate control can be achieved, she should be sent home.  However, patient does not feel comfortable going home in spite of achieving rate control.  Shows stable anemia, stable renal insufficiency.  Case is discussed with Dr. Hal Hope of [facility] hospitalist who agrees to admit the patient.  CHA2DS2/VAS Stroke Risk Points  Current as of a minute ago     4 >= 2 Points: High Risk  1 - 1.99 Points: Medium Risk  0 Points: Low Risk    This is the only CHA2DS2/VAS Stroke Risk Points available for the past  year.:  Last Change: N/A     Details    This score determines the patient's risk of having a stroke if the  patient has atrial fibrillation.       Points Metrics  0 Has Congestive Heart Failure:  No    Current as of a minute ago  0 Has Vascular Disease:  No    Current as of a minute ago  1 Has Hypertension:  Yes    Current as of a minute ago  1 Age:  89    Current as of a minute ago  1 Has Diabetes:  Yes    Current as of a minute ago  0 Had Stroke:  No  Had TIA:  No  Had thromboembolism:  No    Current as of a minute ago  1 Female:  Yes    Current as of a minute ago  Final Clinical Impressions(s) / ED Diagnoses   Final diagnoses:  Atrial fibrillation with RVR ([place])  Renal insufficiency  Microcytic hypochromic anemia    ED Discharge Orders    None       Delora Fuel, MD 74/12/87  0331  

## 2018-07-20 NOTE — ED Triage Notes (Signed)
Pt c/o fluttering in chest; home meter showing pulse 120-150. Denies SOB or chest pain. Has been seen 3 times in the past week with 2 admissions for same. Reports compliance with medications

## 2018-07-20 NOTE — Telephone Encounter (Signed)
Pt was called re: Scheduling of Cardiac CT. Pt has a lot of questions that I can't answer re: her care in the hospital. I advised the pt I would send this to Melina Copa, PA-C to see if she could call her and answer her questions since she was involved with her care. Pt was very appreciative of the recommendation.

## 2018-07-20 NOTE — Telephone Encounter (Signed)
I will try to call her later this afternoon when we are caught up with acute care. Dayna Dunn PA-C

## 2018-07-20 NOTE — Telephone Encounter (Signed)
I spoke to patient. She wanted to clarify how a cardiac CT is different than a pulmonary CT that she had to rule out blood clot. She is agreeable to proceed with cardiac CT. As previously outlined will need to make sure this is in the works of being scheduled and she will need instructions. Also, are there any resources that [place] may be aware of at the hospital that patient can touch base with a financial counselor at [facility]? She remains very concerned about cost; this has been a concern from the beginning.    PA-C

## 2018-07-21 ENCOUNTER — Other Ambulatory Visit: Payer: Self-pay

## 2018-07-21 ENCOUNTER — Observation Stay (HOSPITAL_COMMUNITY): Payer: Medicare Other

## 2018-07-21 ENCOUNTER — Encounter (HOSPITAL_COMMUNITY): Payer: Self-pay | Admitting: Internal Medicine

## 2018-07-21 DIAGNOSIS — E119 Type 2 diabetes mellitus without complications: Secondary | ICD-10-CM | POA: Diagnosis not present

## 2018-07-21 DIAGNOSIS — Z853 Personal history of malignant neoplasm of breast: Secondary | ICD-10-CM | POA: Diagnosis not present

## 2018-07-21 DIAGNOSIS — Z8711 Personal history of peptic ulcer disease: Secondary | ICD-10-CM | POA: Diagnosis not present

## 2018-07-21 DIAGNOSIS — I129 Hypertensive chronic kidney disease with stage 1 through stage 4 chronic kidney disease, or unspecified chronic kidney disease: Secondary | ICD-10-CM | POA: Diagnosis present

## 2018-07-21 DIAGNOSIS — G4733 Obstructive sleep apnea (adult) (pediatric): Secondary | ICD-10-CM | POA: Diagnosis present

## 2018-07-21 DIAGNOSIS — R0602 Shortness of breath: Secondary | ICD-10-CM | POA: Diagnosis not present

## 2018-07-21 DIAGNOSIS — Z923 Personal history of irradiation: Secondary | ICD-10-CM | POA: Diagnosis not present

## 2018-07-21 DIAGNOSIS — R402360 Coma scale, best motor response, obeys commands, unspecified time: Secondary | ICD-10-CM | POA: Diagnosis present

## 2018-07-21 DIAGNOSIS — I4819 Other persistent atrial fibrillation: Secondary | ICD-10-CM | POA: Diagnosis present

## 2018-07-21 DIAGNOSIS — D509 Iron deficiency anemia, unspecified: Secondary | ICD-10-CM | POA: Diagnosis present

## 2018-07-21 DIAGNOSIS — I480 Paroxysmal atrial fibrillation: Secondary | ICD-10-CM

## 2018-07-21 DIAGNOSIS — M5136 Other intervertebral disc degeneration, lumbar region: Secondary | ICD-10-CM | POA: Diagnosis present

## 2018-07-21 DIAGNOSIS — R402250 Coma scale, best verbal response, oriented, unspecified time: Secondary | ICD-10-CM | POA: Diagnosis present

## 2018-07-21 DIAGNOSIS — Z7901 Long term (current) use of anticoagulants: Secondary | ICD-10-CM | POA: Diagnosis not present

## 2018-07-21 DIAGNOSIS — D631 Anemia in chronic kidney disease: Secondary | ICD-10-CM | POA: Diagnosis present

## 2018-07-21 DIAGNOSIS — D649 Anemia, unspecified: Secondary | ICD-10-CM | POA: Diagnosis present

## 2018-07-21 DIAGNOSIS — M549 Dorsalgia, unspecified: Secondary | ICD-10-CM | POA: Diagnosis present

## 2018-07-21 DIAGNOSIS — N183 Chronic kidney disease, stage 3 unspecified: Secondary | ICD-10-CM | POA: Diagnosis present

## 2018-07-21 DIAGNOSIS — R402140 Coma scale, eyes open, spontaneous, unspecified time: Secondary | ICD-10-CM | POA: Diagnosis present

## 2018-07-21 DIAGNOSIS — I6522 Occlusion and stenosis of left carotid artery: Secondary | ICD-10-CM | POA: Diagnosis present

## 2018-07-21 DIAGNOSIS — Z6841 Body Mass Index (BMI) 40.0 and over, adult: Secondary | ICD-10-CM | POA: Diagnosis not present

## 2018-07-21 DIAGNOSIS — D563 Thalassemia minor: Secondary | ICD-10-CM | POA: Diagnosis present

## 2018-07-21 DIAGNOSIS — I4891 Unspecified atrial fibrillation: Secondary | ICD-10-CM | POA: Diagnosis not present

## 2018-07-21 DIAGNOSIS — Z96652 Presence of left artificial knee joint: Secondary | ICD-10-CM | POA: Diagnosis present

## 2018-07-21 DIAGNOSIS — N289 Disorder of kidney and ureter, unspecified: Secondary | ICD-10-CM | POA: Diagnosis not present

## 2018-07-21 DIAGNOSIS — E1122 Type 2 diabetes mellitus with diabetic chronic kidney disease: Secondary | ICD-10-CM | POA: Diagnosis present

## 2018-07-21 DIAGNOSIS — Z8719 Personal history of other diseases of the digestive system: Secondary | ICD-10-CM | POA: Diagnosis not present

## 2018-07-21 DIAGNOSIS — M8580 Other specified disorders of bone density and structure, unspecified site: Secondary | ICD-10-CM | POA: Diagnosis present

## 2018-07-21 DIAGNOSIS — I080 Rheumatic disorders of both mitral and aortic valves: Secondary | ICD-10-CM | POA: Diagnosis present

## 2018-07-21 DIAGNOSIS — G8929 Other chronic pain: Secondary | ICD-10-CM | POA: Diagnosis present

## 2018-07-21 LAB — BASIC METABOLIC PANEL
Anion gap: 9 (ref 5–15)
BUN: 18 mg/dL (ref 8–23)
CO2: 21 mmol/L — ABNORMAL LOW (ref 22–32)
Calcium: 8.9 mg/dL (ref 8.9–10.3)
Chloride: 109 mmol/L (ref 98–111)
Creatinine, Ser: 1.17 mg/dL — ABNORMAL HIGH (ref 0.44–1.00)
GFR calc Af Amer: 54 mL/min — ABNORMAL LOW (ref >60)
GFR calc non Af Amer: 47 mL/min — ABNORMAL LOW (ref >60)
Glucose, Bld: 189 mg/dL — ABNORMAL HIGH (ref 70–99)
Potassium: 4 mmol/L (ref 3.5–5.1)
Sodium: 139 mmol/L (ref 135–145)

## 2018-07-21 LAB — PROTIME-INR
INR: 2.56
Prothrombin Time: 27.2 seconds — ABNORMAL HIGH (ref 11.4–15.2)

## 2018-07-21 LAB — CBC WITH DIFFERENTIAL/PLATELET
Band Neutrophils: 0 %
Basophils Absolute: 0.1 10*3/uL (ref 0.0–0.1)
Basophils Relative: 1 %
Blasts: 0 %
Eosinophils Absolute: 0.3 10*3/uL (ref 0.0–0.5)
Eosinophils Relative: 3 %
HCT: 35.5 % — ABNORMAL LOW (ref 36.0–46.0)
Hemoglobin: 9.8 g/dL — ABNORMAL LOW (ref 12.0–15.0)
Lymphocytes Relative: 23 %
Lymphs Abs: 2 10*3/uL (ref 0.7–4.0)
MCH: 17.2 pg — ABNORMAL LOW (ref 26.0–34.0)
MCHC: 27.6 g/dL — ABNORMAL LOW (ref 30.0–36.0)
MCV: 62.3 fL — ABNORMAL LOW (ref 80.0–100.0)
Metamyelocytes Relative: 0 %
Monocytes Absolute: 0.7 10*3/uL (ref 0.1–1.0)
Monocytes Relative: 8 %
Myelocytes: 0 %
Neutro Abs: 5.5 10*3/uL (ref 1.7–7.7)
Neutrophils Relative %: 65 %
Other: 0 %
Platelets: 177 10*3/uL (ref 150–400)
Promyelocytes Relative: 0 %
RBC: 5.7 MIL/uL — ABNORMAL HIGH (ref 3.87–5.11)
RDW: 19.9 % — ABNORMAL HIGH (ref 11.5–15.5)
WBC: 8.6 10*3/uL (ref 4.0–10.5)
nRBC: 0 % (ref 0.0–0.2)
nRBC: 0 /100 WBC

## 2018-07-21 LAB — TROPONIN I: Troponin I: <0.03 ng/mL (ref <0.03)

## 2018-07-21 LAB — GLUCOSE, CAPILLARY
Glucose-Capillary: 141 mg/dL — ABNORMAL HIGH (ref 70–99)
Glucose-Capillary: 114 mg/dL — ABNORMAL HIGH (ref 70–99)
Glucose-Capillary: 124 mg/dL — ABNORMAL HIGH (ref 70–99)
Glucose-Capillary: 136 mg/dL — ABNORMAL HIGH (ref 70–99)

## 2018-07-21 LAB — MRSA PCR SCREENING: MRSA by PCR: NEGATIVE

## 2018-07-21 LAB — I-STAT TROPONIN, ED: Troponin i, poc: 0 ng/mL (ref 0.00–0.08)

## 2018-07-21 LAB — TSH: TSH: 3.295 u[IU]/mL (ref 0.350–4.500)

## 2018-07-21 LAB — MAGNESIUM: Magnesium: 1.8 mg/dL (ref 1.7–2.4)

## 2018-07-21 MED ORDER — ADULT MULTIVITAMIN W/MINERALS CH
1.0000 | ORAL_TABLET | Freq: Every day | ORAL | Status: DC
  Administered 2018-07-21 – 2018-07-23 (×3): 1 via ORAL
  Filled 2018-07-21 (×3): qty 1

## 2018-07-21 MED ORDER — AMIODARONE HCL 200 MG PO TABS
200.0000 mg | ORAL_TABLET | Freq: Two times a day (BID) | ORAL | Status: DC
  Administered 2018-07-22 – 2018-07-23 (×3): 200 mg via ORAL
  Filled 2018-07-21 (×3): qty 1

## 2018-07-21 MED ORDER — DILTIAZEM LOAD VIA INFUSION
10.0000 mg | Freq: Once | INTRAVENOUS | Status: AC
  Administered 2018-07-21: 10 mg via INTRAVENOUS
  Filled 2018-07-21: qty 10

## 2018-07-21 MED ORDER — FLECAINIDE ACETATE 100 MG PO TABS
100.0000 mg | ORAL_TABLET | Freq: Two times a day (BID) | ORAL | Status: DC
  Administered 2018-07-21: 100 mg via ORAL
  Filled 2018-07-21 (×2): qty 1

## 2018-07-21 MED ORDER — DILTIAZEM HCL 60 MG PO TABS: 90.0000 mg | ORAL_TABLET | Freq: Two times a day (BID) | ORAL | Status: DC

## 2018-07-21 MED ORDER — DILTIAZEM HCL ER COATED BEADS 240 MG PO CP24
240.0000 mg | ORAL_CAPSULE | Freq: Every day | ORAL | Status: DC
  Administered 2018-07-21 – 2018-07-23 (×3): 240 mg via ORAL
  Filled 2018-07-21 (×3): qty 1

## 2018-07-21 MED ORDER — WARFARIN SODIUM 5 MG PO TABS
5.0000 mg | ORAL_TABLET | ORAL | Status: DC
  Administered 2018-07-21: 5 mg via ORAL
  Filled 2018-07-21: qty 1

## 2018-07-21 MED ORDER — PANTOPRAZOLE SODIUM 40 MG PO TBEC
40.0000 mg | DELAYED_RELEASE_TABLET | Freq: Every day | ORAL | Status: DC
  Administered 2018-07-21 – 2018-07-23 (×3): 40 mg via ORAL
  Filled 2018-07-21 (×3): qty 1

## 2018-07-21 MED ORDER — DILTIAZEM HCL-DEXTROSE 100-5 MG/100ML-% IV SOLN (PREMIX)
5.0000 mg/h | INTRAVENOUS | Status: DC
  Administered 2018-07-21: 5 mg/h via INTRAVENOUS
  Administered 2018-07-21 (×2): 15 mg/h via INTRAVENOUS
  Filled 2018-07-21 (×4): qty 100

## 2018-07-21 MED ORDER — WARFARIN - PHARMACIST DOSING INPATIENT: Freq: Every day | Status: DC

## 2018-07-21 MED ORDER — DILTIAZEM HCL 60 MG PO TABS: 90.0000 mg | ORAL_TABLET | Freq: Four times a day (QID) | ORAL | Status: DC

## 2018-07-21 MED ORDER — WARFARIN SODIUM 7.5 MG PO TABS
7.5000 mg | ORAL_TABLET | ORAL | Status: DC
  Administered 2018-07-22: 7.5 mg via ORAL
  Filled 2018-07-21: qty 1

## 2018-07-21 NOTE — Progress Notes (Signed)
Late entry Patient admitted with A. fib with RVR.  Started on dual drip.  Still with RVR in 110-120's. Very upset about not being able to see cardiology yet. Threatening to leave AMA and go to [facility].  On phone with her cardiologist's office. I apologized about the delay and contacted cardiology to ensure that she is on their list that was confirmed to me. I asked her to wait patiently and told her one of them will get to her soon.  Denies chest pain or dyspnea.  Speaks in full sentence.  Sitting on bedside chair making phone calls to her cardiologist office. Cardiopulmonary exam significant for atrial fibrillation with RVR.

## 2018-07-21 NOTE — Telephone Encounter (Signed)
Per Melina Copa, PA-C, pt has been admitted so we will hold off on the CT for now.

## 2018-07-21 NOTE — Consult Note (Addendum)
Cardiology Consultation:   Patient ID: ALVERDA NAZZARO MRN: [id number]; DOB: 1946/04/21  Admit date: 07/20/2018 Date of Consult: 07/21/2018  Primary Care Provider: Josetta Huddle, MD Primary Cardiologist: Fransico Him, MD  Primary Electrophysiologist:  None    Patient Profile:   RAISSA DAM is a 72 y.o. female with a hx of DM, HTN, OA w/chronic back pain, OSA w/BIPAP, morbid obesity, thalassemia minor, CKD, and AFib who is being seen today for the evaluation of difficult to manage Afib at the request of Dr. Renetta Chalk.  History of Present Illness:   Ms. Dalporto has not seen EP formally, though I did see her for a cardiology follow up for dr. Radford Pax back in 2017.  At that time she has Rythmol for PRN use (though had never used), taking daily diltiazem TID, she had been seen by her sleep specialist with adjustment to her BIPAP and was feeling quite well,  to follow up with Dr. Radford Pax as usual.  It appears that she did these last couple years.  07/02/18 admitted with CP, SOB, dizziness, noted in AFib w/RVR, recent URI symptoms, treated with dilt gtt, metoprolol added. GI consulted for heme + stool, underwent EGD and colonoscopy on 07/06/2018.  EGD showed multiple nonbleeding duodenal ulcer, biopsy was taken out to rule out H. pylori.  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  Had TEE/DCCV 07/07/18, planned for coronary CT to be done out patient. Discharged 07/07/18 on dilt 90mg  Q6hours (for cost reasons), and metoprolol 25mg  BID with her warfarin for a/c.  Readmitted 07/12/18, again in symptomatic rapid AFib.  It seems in d/w EP started on flecainide during this stay with hx of normal stress tests in the past,  had DCCV 07/14/18, discharged on flecainide 100mg  BID, dilt 90mg  BID and metoprolol 25mg  BID.  07/14/18 DCCV to SR 07/16/18 saw D. Dunn, PA in clinic reported initially feeling great post DCCV though developed fatigue  and DOE.  Walk in the office noted to desat to 74% and chronic cough since oct, she was in SB 53bpm.  It seems she was still on short acting dilt then (due to cost).  She was referred to the ER due to SOB, anemia and hypoxia   07/16/18 ER visit, r/o PE and pneumonia, she was in [place], VSS, in d/w cardiology decided to stop metoprolol and d/c for PRN use only  Phone notes since then discuss the patient feeling much better, plans for coronary CT underway.  She was admitted yesterday with recurrent palpitations, she reported taking her home meds as directed, called the office with recs to take an additional 1/2 tab of dilt though had no improvement and came in.  She was found again in rapid Afib and started on dilt gtt  LABS K+ 4.0 Mag 1.8 BUN/Creat 18/1.18 poc Trop 0.00 Trop I; <0.03 WBC 8.6 H/H 9.835.5 Plts 177 TSH 3.295 INR 2.56  Current rate/rhythm meds dilt gtt @ 15/hr Flecainide 100mg  BID 9started 07/12/18)  The patient is quite upset about her last couple weeks, extremely upset feels like she is getting poor service with no improvement in her symptoms.    She confirms that she has not been bothered by her AF in years.  She also states that when she is not in AFib, she feels very well without limitations.  The one time unusual for her in SR when she was sent to the ER (note above)she was feeling quite SOB even in normal rhythm.  She  denies any CP, SOB, or DOE otherwise. Her AFib when going so fast gives her chest pressure and makes her feel extremely tired and weak.  At rest with her HR controlled currently she feels OK, and can not tell if she feels bad or not, though denies any active CP or SOB.   Past Medical History:  Diagnosis Date  . Anemia   . Aortic stenosis    mild by echo 08/2016, not mentioned in 06/2018 TEE  . Breast CA ([place])    left  . Carotid stenosis    1-39% left  . Degenerative disc disease, lumbar    of the knees  . Diabetes mellitus   . Empty sella syndrome  ([place])   . Fatigue    Severe secondary to to gemfibrozil  . Gastroesophageal reflux disease    denies  . GI bleeding    a. 06/2018: EGD showed multiple nonbleeding duodenal ulcers. 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  . Hammertoe    left second toe  . Hypercholesteremia   . Hypertension   . Insomnia   . Irritable bowel   . Low back pain   . Metabolic syndrome   . Mild mitral regurgitation 06/2018  . Morbid obesity ([place])   . OSA on CPAP \   bipap  . Osteoarthritis   . Osteopenia   . Persistent atrial fibrillation   . Personal history of radiation therapy   . Pneumonia   . PONV (postoperative nausea and vomiting)   . Sigmoid diverticulosis   . Thalassemia minor   . Thalassemia minor   . Vitamin D deficiency     Past Surgical History:  Procedure Laterality Date  . BIOPSY  07/06/2018   Procedure: BIOPSY;  Surgeon: Clarene Essex, MD;  Location: [place];  Service: Endoscopy;;  . BREAST LUMPECTOMY Left 05/28/2009  . BREAST SURGERY Left 10   lumpectomy  . CARDIOVERSION N/A 07/07/2018   Procedure: CARDIOVERSION;  Surgeon: [name], MD;  Location[facility] ENDOSCOPY;  Service: Cardiovascular;  Laterality: N/A;  . CARDIOVERSION N/A 07/14/2018   Procedure: CARDIOVERSION;  Surgeon: Larey Dresser, MD;  Location: [facility] ENDOSCOPY;  Service: Cardiovascular;  Laterality: N/A;  . CESAREAN SECTION    . COLONOSCOPY WITH PROPOFOL N/A 07/06/2018   Procedure: COLONOSCOPY WITH PROPOFOL;  Surgeon: Clarene Essex, MD;  Location: [place];  Service: Endoscopy;  Laterality: N/A;  . CYSTECTOMY  70   pilonidal   . DILATION AND CURETTAGE OF UTERUS    . ESOPHAGOGASTRODUODENOSCOPY (EGD) WITH PROPOFOL N/A 07/06/2018   Procedure: ESOPHAGOGASTRODUODENOSCOPY (EGD) WITH PROPOFOL;  Surgeon: Clarene Essex, MD;  Location: [place];  Service: Endoscopy;  Laterality: N/A;  . EYE SURGERY Bilateral 12/14   cataracts  . HOT HEMOSTASIS  N/A 07/06/2018   Procedure: HOT HEMOSTASIS (ARGON PLASMA COAGULATION/BICAP);  Surgeon: Clarene Essex, MD;  Location: [place];  Service: Endoscopy;  Laterality: N/A;  . MAXIMUM ACCESS (MAS)POSTERIOR LUMBAR INTERBODY FUSION (PLIF) 2 LEVEL N/A 09/22/2013   Procedure: FOR MAXIMUM ACCESS  POSTERIOR LUMBAR INTERBODY FUSION LUMBAR THREE-FOUR FOUR-FIVE;  Surgeon: Eustace Moore, MD;  Location: [place] NEURO ORS;  Service: Neurosurgery;  Laterality: N/A;  . OOPHORECTOMY     wedge section not rem  . 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 REPLACEMENT TOTAL KNEE    . TEE WITHOUT CARDIOVERSION N/A 07/07/2018   Procedure: TRANSESOPHAGEAL ECHOCARDIOGRAM (TEE);  Surgeon: [name], MD;  Location: [place];  Service: Cardiovascular;  Laterality: N/A;  . TONSILLECTOMY    .  TUBAL LIGATION       Home Medications:  Prior to Admission medications   Medication Sig Start Date End Date Taking? Authorizing Provider  acetaminophen (TYLENOL) 500 MG tablet Take 500 mg by mouth every 6 (six) hours as needed for mild pain. For back pain   Yes [provider]  Biotin 10 MG TABS Take 10 mg by mouth 2 (two) times daily.   Yes [provider]  Calcium Carbonate-Vitamin D (CALCIUM 600 + D PO) Take 1 tablet by mouth daily.    Yes [provider]  diltiazem (CARDIZEM) 90 MG tablet Take 1 tablet (90 mg total) by mouth 2 (two) times daily. 07/14/18 08/13/18 Yes Chundi, Vahini, MD  diphenhydramine-acetaminophen (TYLENOL PM) 25-500 MG TABS tablet Take 1 tablet by mouth at bedtime as needed (sleep).    Yes [provider]  flecainide (TAMBOCOR) 100 MG tablet Take 1 tablet (100 mg total) by mouth every 12 (twelve) hours. 07/14/18 10/12/18 Yes Chundi, Vahini, MD  glimepiride (AMARYL) 4 MG tablet Take 2 mg by mouth daily.  10/29/15  Yes [provider]  metFORMIN (GLUCOPHAGE-XR) 500 MG 24 hr tablet Take 1 tablet by mouth 2 (two) times daily. 10/31/14  Yes [provider]  Multiple  Vitamin (MULITIVITAMIN WITH MINERALS) TABS Take 1 tablet by mouth daily.   Yes [provider]  pantoprazole (PROTONIX) 40 MG tablet Take 1 tablet (40 mg total) by mouth daily. 07/08/18  Yes Almyra Deforest, PA  warfarin (COUMADIN) 5 MG tablet Take 5-7.5 mg by mouth See admin instructions. 7.5 mg  Thursday. 5mg  all the other days   Yes [provider]    Inpatient Medications: Scheduled Meds: . diltiazem  90 mg Oral BID  . flecainide  100 mg Oral Q12H  . multivitamin with minerals  1 tablet Oral Daily  . pantoprazole  40 mg Oral Daily  . warfarin  5 mg Oral Once per day on Sun Mon Tue Wed Fri Sat   And  . [START ON 07/22/2018] warfarin  7.5 mg Oral Q Thu-1800  . Warfarin - Pharmacist Dosing Inpatient   Does not apply q1800   Continuous Infusions: . diltiazem (CARDIZEM) infusion 15 mg/hr (07/21/18 0738)   PRN Meds:   Allergies:    Allergies  Allergen Reactions  . Aromasin [Exemestane]     Hair loss   . Femara [Letrozole]     unknown  . Gemfibrozil Other (See Comments)    Other reaction(s): Other (See Comments) Unknown Severe fatigue  Other reaction(s): Other (See Comments) Unknown Severe fatigue Unknown  . Hydrocodone-Acetaminophen Nausea And Vomiting    Patient reports terrible nausea; plain tylenol is ok with patient  . Letrozole Other (See Comments)    Unknown reactions   . Metoprolol Diarrhea    Fatigue   . Other Other (See Comments)    Other reaction(s): Other (See Comments) Numerous cancer drugs - unknown reaction Other reaction(s): Other (See Comments) Numerous cancer drugs - unknown reaction Numerous cancer drugs - unknown reaction  . Tamoxifen     Bone and body aches   . Tetracycline Other (See Comments)    Other reaction(s): Other (See Comments) Unknown reaction Other reaction(s): Other (See Comments) Unknown reaction Unknown reaction  . Tetracyclines & Related Other (See Comments)    Vaginal infections    . Toprol Xl [Metoprolol  Tartrate]     Hair loss  . Verapamil Other (See Comments)    Other reaction(s): Other (See Comments) Unknown reaction Other reaction(s):  Other (See Comments) Unknown reaction Unknown reaction  . Vicodin [Hydrocodone-Acetaminophen] Nausea And Vomiting    Patient reports terrible nausea; plain tylenol is ok with patient    Social History:   Social History   Socioeconomic History  . Marital status: Single    Spouse name: Not on file  . Number of children: Not on file  . Years of education: Not on file  . Highest education level: Not on file  Occupational History  . Not on file  Social Needs  . Financial resource strain: Not on file  . Food insecurity:    Worry: Not on file    Inability: Not on file  . Transportation needs:    Medical: Not on file    Non-medical: Not on file  Tobacco Use  . Smoking status: Former Smoker    Packs/day: 0.50    Years: 10.00    Pack years: 5.00    Types: Cigarettes    Last attempt to quit: 11/12/2003    Years since quitting: 14.6  . Smokeless tobacco: Never Used  Substance and Sexual Activity  . Alcohol use: Yes    Comment: occasionally  . Drug use: No  . Sexual activity: Never    Birth control/protection: Post-menopausal  Lifestyle  . Physical activity:    Days per week: Not on file    Minutes per session: Not on file  . Stress: Not on file  Relationships  . Social connections:    Talks on phone: Not on file    Gets together: Not on file    Attends religious service: Not on file    Active member of club or organization: Not on file    Attends meetings of clubs or organizations: Not on file    Relationship status: Not on file  . Intimate partner violence:    Fear of current or ex partner: Not on file    Emotionally abused: Not on file    Physically abused: Not on file    Forced sexual activity: Not on file  Other Topics Concern  . Not on file  Social History Narrative  . Not on file    Family History:   Family History    Problem Relation Age of Onset  . Anesthesia problems Mother   . Diabetes Mother   . Diabetes Father      ROS:  Please see the history of present illness.  All other ROS reviewed and negative.     Physical Exam/Data:   Vitals:   07/21/18 0300 07/21/18 0428 07/21/18 0800 07/21/18 0855  BP: (!) 118/58 128/63 129/62   Pulse: (!) 53 86    Resp:  19    Temp:  98.5 F (36.9 C)  99.1 F (37.3 C)  TempSrc:  Oral  Oral  SpO2: 94% 96%  96%    Intake/Output Summary (Last 24 hours) at 07/21/2018 1147 Last data filed at 07/21/2018 1017 Gross per 24 hour  Intake 78.53 ml  Output -  Net 78.53 ml   There were no vitals filed for this visit. There is no height or weight on file to calculate BMI.  General:  Well nourished, well developed, in no acute distress HEENT: normal Lymph: no adenopathy Neck: no JVD Endocrine:  No thryomegaly Vascular: No carotid bruits  Cardiac:  irreg-irreg; 1/6SM, no gallops or rubs Lungs:  CTA b/l, no wheezing, rhonchi or rales  Abd: soft, nontender, no hepatomegaly  Ext: no edema Musculoskeletal:  No deformities Skin: warm and dry  Neuro:  No gross focal abnormalities noted Psych:  Normal affect   EKG:  The EKG was personally reviewed and demonstrates:   AFib 130bpm, QRS 5ms  07/16/18 SB 53bpm, PR 226ms, QRS 174ms, QT measured is 423ms, QTc 467ms Telemetry:  Telemetry was personally reviewed and demonstrates:   Currently AFib 90s's  Relevant CV Studies:  TEE 07/07/2018 LV EF: 55% - 60% Study Conclusions - Left ventricle: Systolic function was normal. The estimated ejection fraction was in the range of 55% to 60%. No evidence of thrombus. - Aortic valve: No evidence of vegetation. - Mitral valve: No evidence of vegetation. There was mild regurgitation. - Left atrium: No evidence of thrombus in the atrial cavity or appendage. No evidence of thrombus in the atrial cavity or appendage. No evidence of thrombus in the atrial  cavity or appendage. - Right atrium: No evidence of thrombus in the atrial cavity or appendage. - Atrial septum: There was a patent foramen ovale with flow noted by color flow Doppler. - Tricuspid valve: No evidence of vegetation. - Pulmonic valve: No evidence of vegetation. Impressions: - Successful cardioversion. No cardiac source of emboli was indentified.   09/24/16: TTE Study Conclusions - Left ventricle: The cavity size was normal. Wall thickness was   increased in a pattern of moderate LVH. Systolic function was   normal. The estimated ejection fraction was in the range of 55%   to 60%. Wall motion was normal; there were no regional wall   motion abnormalities. Left ventricular diastolic function   parameters were normal. - Aortic valve: There was very mild stenosis. - Mitral valve: nodular calcification of anterior mitral leaflet.   Calcified annulus. Mildly thickened leaflets . - Left atrium: The atrium was moderately dilated. - Atrial septum: No defect or patent foramen ovale was identified   09/27/15: 48 hour monitor    Study Highlights     Normal sinus rhythm and sinus tachycardia with average heart rate 85bpm. The heart rate ranged from 65-132bpm.  Occasional PVCs  Frequent PACs and nonsutained atrial tachycardia up to 10 beats.    09/27/15 Lexiscan stress    Study Highlights     Nuclear stress EF: 76%.  There was no ST segment deviation noted during stress.  The study is normal.  The left ventricular ejection fraction is hyperdynamic (>65%).  Normal stress nuclear study with no ischemia or infarction; EF 76 with normal wall motion.   01/12/15 Echocardiogram: Study Conclusions - Left ventricle: The cavity size was normal. Systolic function was normal. The estimated ejection fraction was in the range of 60% to 65%. Wall motion was normal; there were no regional wall motion abnormalities. - Aortic valve: Mild thickening, consistent  with sclerosis. Transvalvular velocity was within the normal range. There was no stenosis. There was no regurgitation. - Mitral valve: Moderate focal calcification of the anterior leaflet (medial segment(s)). There was mild regurgitation.  01/26/14: Lexiscan stress myoview Impression Exercise Capacity: Lexiscan with no exercise. BP Response: Normal blood pressure response. Clinical Symptoms: No significant symptoms noted. ECG Impression: No significant ST segment change suggestive of ischemia. Comparison with Prior Nuclear Study: No significant change from previous study Overall Impression: Normal stress nuclear study. Atrial fibrillation with RVR.    Laboratory Data:  Chemistry Recent Labs  Lab 07/16/18 1218 07/21/18 0002  NA 137 139  K 4.7 4.0  CL 107 109  CO2 21* 21*  GLUCOSE 158* 189*  BUN 24* 18  CREATININE 1.39* 1.17*  CALCIUM 9.1 8.9  GFRNONAA  37* 47*  GFRAA 43* 54*  ANIONGAP 9 9    No results for input(s): PROT, ALBUMIN, AST, ALT, ALKPHOS, BILITOT in the last 168 hours. Hematology Recent Labs  Lab 07/16/18 1218 07/21/18 0002  WBC 10.8* 8.6  RBC 5.80* 5.70*  HGB 9.7* 9.8*  HCT 36.5 35.5*  MCV 62.9* 62.3*  MCH 16.7* 17.2*  MCHC 26.6* 27.6*  RDW 19.4* 19.9*  PLT 232 177   Cardiac Enzymes Recent Labs  Lab 07/21/18 0638  TROPONINI <0.03    Recent Labs  Lab 07/16/18 1230 07/21/18 0010  TROPIPOC 0.01 0.00    BNP Recent Labs  Lab 07/16/18 1218  BNP 108.0*    DDimer No results for input(s): DDIMER in the last 168 hours.  Radiology/Studies:   Dg Chest Port 1 View Result Date: 07/21/2018 CLINICAL DATA:  Shortness of breath. EXAM: PORTABLE CHEST 1 VIEW COMPARISON:  Radiographs and CT 07/16/2018 FINDINGS: Unchanged mild cardiomegaly. Unchanged mediastinal contours. Stable chronic bronchitic change. No focal airspace disease, pleural effusion or pneumothorax. Unchanged osseous structures. IMPRESSION: Stable mild cardiomegaly and  bronchitic change. No superimposed acute abnormality. Electronically Signed   By: Keith Rake M.D.   On: 07/21/2018 06:04    Assessment and Plan:   1. Paroxysmal AFib     CHA2DS2Vasc is 4, on warfarin (notes report patient preference)  The patient can not identify and change of late to have provoked her AF, no clear triggers in the last few weeks either. She does not drink Reports excellent compliance with her BIPAP TEE from this month does not describe LA  Size TTE LA measured at 64mm She is obese.  She has had ERAF post DCCV once without AAD, and again with an AAD on board a couple weeks (flecianide).   She is very frustrated, not sure she wants to c/w with our group though was willing to hear our thoughts  I do not think another DCCV at this juncture on the same tx would have a different result I discussed possibly of stopping Flecainide to allow washout for another AAD, perhaps Tikosyn or Sotalol, these would require a hospitalization to start and would need a few days of washout of her flecainide Multaq perhaps a consideration Discussed possibility of AFib ablation as well.  She would like to discuss with Dr. Rayann Heman further, she may want to lean towards ablation if he thinks she is a good candidate.   She is in the works of coronary CT outpatient, while she is in AFib, this is not reasonable to do now.  She is not having CP outside of her AFib w/RVR, or currently.  Would defer this to cards tem out patient at least for now.  Dr. Rayann Heman will see her later today, the patient is aware he is in procedures today and will be by to see her later in the day.  No changes for now.        For questions or updates, please contact [place] Please consult [email or web address] for contact info under     Signed, Baldwin Jamaica, PA-C  07/21/2018 11:47 AM    I have seen, examined the patient, and reviewed the above assessment and plan.  Changes to above are made where  necessary.  On exam, iRRR.  Pt with refractory and symptomatic atrial fibrillation.  We discussed treatment options at length.   Options including tikosyn, amiodarone, and ablation were discussed.  We also talked about the value of the AF clinic to keep her out of  the ED/ hospital as well as use of cardizem for rate control. She has failed medical therapy with flecainide.  We will therefore stop this medicine at this time. She worries about costs of tikosyn as well as long term risks of amiodarone.  She would prefer ablation. For now, we will convert diltiazem to oral diltiazem and use short acting cardizem as needed. We will start amiodarone 200mg  BID in AM.  I anticipate that she could be discharged once V rates are improved off of IV diltiazem (hopefully tomorrow).  She can continue oral amiodarone as an outpatient and follow-up in the AF clinic in 1 week to arrange elective afib ablation with me.  Very complicated patient with refractory afib.  A high level of decision making was required for this encounter.  Co Sign: Thompson Grayer, MD 07/21/2018 9:41 PM

## 2018-07-21 NOTE — Progress Notes (Signed)
ANTICOAGULATION CONSULT NOTE - Initial Consult  Pharmacy Consult for Coumadin Indication: atrial fibrillation  Allergies  Allergen Reactions  . Aromasin [Exemestane]     Hair loss   . Femara [Letrozole]     unknown  . Gemfibrozil Other (See Comments)    Other reaction(s): Other (See Comments) Unknown Severe fatigue  Other reaction(s): Other (See Comments) Unknown Severe fatigue Unknown  . Hydrocodone-Acetaminophen Nausea And Vomiting    Patient reports terrible nausea; plain tylenol is ok with patient  . Letrozole Other (See Comments)    Unknown reactions   . Metoprolol Diarrhea    Fatigue   . Other Other (See Comments)    Other reaction(s): Other (See Comments) Numerous cancer drugs - unknown reaction Other reaction(s): Other (See Comments) Numerous cancer drugs - unknown reaction Numerous cancer drugs - unknown reaction  . Tamoxifen     Bone and body aches   . Tetracycline Other (See Comments)    Other reaction(s): Other (See Comments) Unknown reaction Other reaction(s): Other (See Comments) Unknown reaction Unknown reaction  . Tetracyclines & Related Other (See Comments)    Vaginal infections    . Toprol Xl [Metoprolol Tartrate]     Hair loss  . Verapamil Other (See Comments)    Other reaction(s): Other (See Comments) Unknown reaction Other reaction(s): Other (See Comments) Unknown reaction Unknown reaction  . Vicodin [Hydrocodone-Acetaminophen] Nausea And Vomiting    Patient reports terrible nausea; plain tylenol is ok with patient    Patient Measurements:    Vital Signs: Temp: 98 F (36.7 C) (11/26 2310) Temp Source: Oral (11/26 2310) BP: 118/58 (11/27 0300) Pulse Rate: 53 (11/27 0300)  Labs: Recent Labs    07/21/18 0002  HGB 9.8*  HCT 35.5*  PLT 177  LABPROT 27.2*  INR 2.56  CREATININE 1.17*    Estimated Creatinine Clearance: 53.4 mL/min (A) (by C-G formula based on SCr of 1.17 mg/dL (H)).   Medical History: Past Medical  History:  Diagnosis Date  . Anemia   . Aortic stenosis    mild by echo 08/2016, not mentioned in 06/2018 TEE  . Breast CA ([place])    left  . Carotid stenosis    1-39% left  . Degenerative disc disease, lumbar    of the knees  . Diabetes mellitus   . 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[place])   . Fatigue    Severe secondary to to gemfibrozil  . Gastroesophageal reflux disease    denies  . GI bleeding    a. 06/2018: EGD showed multiple nonbleeding duodenal ulcers. 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  . Hammertoe    left second toe  . Hypercholesteremia   . Hypertension   . Insomnia   . Irritable bowel   . Low back pain   . Metabolic syndrome   . Mild mitral regurgitation 06/2018  . Morbid obesity ([place])   . OSA on CPAP \   bipap  . Osteoarthritis   . Osteopenia   . Persistent atrial fibrillation   . Personal history of radiation therapy   . Pneumonia   . PONV (postoperative nausea and vomiting)   . Sigmoid diverticulosis   . Thalassemia minor   . Thalassemia minor   . Vitamin D deficiency     Medications:  Medications Prior to Admission  Medication Sig Dispense Refill Last Dose  . acetaminophen (TYLENOL) 500 MG tablet Take 500 mg by mouth every 6 (six) hours as needed for mild pain. For  back pain   Past Month at Unknown time  . Biotin 10 MG TABS Take 10 mg by mouth 2 (two) times daily.   07/20/2018 at Unknown time  . Calcium Carbonate-Vitamin D (CALCIUM 600 + D PO) Take 1 tablet by mouth daily.    07/20/2018 at Unknown time  . diltiazem (CARDIZEM) 90 MG tablet Take 1 tablet (90 mg total) by mouth 2 (two) times daily. 60 tablet 0 07/20/2018 at Unknown time  . diphenhydramine-acetaminophen (TYLENOL PM) 25-500 MG TABS tablet Take 1 tablet by mouth at bedtime as needed (sleep).    Past Month at Unknown time  . flecainide (TAMBOCOR) 100 MG tablet Take 1 tablet (100 mg total) by mouth every 12 (twelve) hours. 180 tablet 0  07/20/2018 at Unknown time  . glimepiride (AMARYL) 4 MG tablet Take 2 mg by mouth daily.   3 07/20/2018 at Unknown time  . metFORMIN (GLUCOPHAGE-XR) 500 MG 24 hr tablet Take 1 tablet by mouth 2 (two) times daily.  3 07/20/2018 at Unknown time  . Multiple Vitamin (MULITIVITAMIN WITH MINERALS) TABS Take 1 tablet by mouth daily.   07/20/2018 at Unknown time  . pantoprazole (PROTONIX) 40 MG tablet Take 1 tablet (40 mg total) by mouth daily. 90 tablet 3 07/20/2018 at Unknown time  . warfarin (COUMADIN) 5 MG tablet Take 5-7.5 mg by mouth See admin instructions. 7.5 mg  Thursday. 5mg  all the other days   07/20/2018 at 1800    Assessment: 72 y.o. female with Afib to continue Coumadin Goal of Therapy:  INR 2-3 Monitor platelets by anticoagulation protocol: Yes   Plan:  Continue home Coumadin regimen  Daily INR  Reynolds Kittel, Bronson Curb 07/21/2018,5:20 AM

## 2018-07-21 NOTE — H&P (Signed)
History and Physical    [name]:[id number] DOB: 1945/12/03 DOA: 07/20/2018  PCP: Josetta Huddle, MD  Patient coming from: Home.  Chief Complaint: Palpitations.  HPI: Jenna Mcdonald is a 72 y.o. female with history of atrial fibrillation who has had recent cardioversion and was discharged home 6 days ago presents with complaints of having palpitations since last evening 6 PM.  Last evening around 6 PM patient did take her home medication of Cardizem and flecainide and Coumadin.  Patient did call cardiology office around 8 PM and was told to take another half dose of her Cardizem despite which patient still had palpitation and decided to come to the ER.  Denies any chest pain shortness of breath dizziness loss of consciousness.  Last week Friday around 5 days ago patient did follow-up with her cardiology office and at that time patient was very short of breath and fatigue and her metoprolol was discontinued.  ED Course: In the ER patient was in A. fib with RVR and was started on Cardizem infusion.  Patient's labs are at baseline.  Patient admitted for further management of A. fib with RVR.  Review of Systems: As per HPI, rest all negative.   Past Medical History:  Diagnosis Date  . Anemia   . Aortic stenosis    mild by echo 08/2016, not mentioned in 06/2018 TEE  . Breast CA ([place])    left  . Carotid stenosis    1-39% left  . Degenerative disc disease, lumbar    of the knees  . Diabetes mellitus   . 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[place])   . Fatigue    Severe secondary to to gemfibrozil  . Gastroesophageal reflux disease    denies  . GI bleeding    a. 06/2018: EGD showed multiple nonbleeding duodenal ulcers. 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  . Hammertoe    left second toe  . Hypercholesteremia   . Hypertension   . Insomnia   . Irritable bowel   . Low back pain   . Metabolic syndrome   . Mild mitral  regurgitation 06/2018  . Morbid obesity ([place])   . OSA on CPAP \   bipap  . Osteoarthritis   . Osteopenia   . Persistent atrial fibrillation   . Personal history of radiation therapy   . Pneumonia   . PONV (postoperative nausea and vomiting)   . Sigmoid diverticulosis   . Thalassemia minor   . Thalassemia minor   . Vitamin D deficiency     Past Surgical History:  Procedure Laterality Date  . BIOPSY  07/06/2018   Procedure: BIOPSY;  Surgeon: Clarene Essex, MD;  Location: [facility];  Service: Endoscopy;;  . BREAST LUMPECTOMY Left 05/28/2009  . BREAST SURGERY Left 10   lumpectomy  . CARDIOVERSION N/A 07/07/2018   Procedure: CARDIOVERSION;  Surgeon: [name], MD;  Location: [facility] ENDOSCOPY;  Service: Cardiovascular;  Laterality: N/A;  . CARDIOVERSION N/A 07/14/2018   Procedure: CARDIOVERSION;  Surgeon: Larey Dresser, MD;  Location: [facility] ENDOSCOPY;  Service: Cardiovascular;  Laterality: N/A;  . CESAREAN SECTION    . COLONOSCOPY WITH PROPOFOL N/A 07/06/2018   Procedure: COLONOSCOPY WITH PROPOFOL;  Surgeon: Clarene Essex, MD;  Location: [place];  Service: Endoscopy;  Laterality: N/A;  . CYSTECTOMY  70   pilonidal   . DILATION AND CURETTAGE OF UTERUS    . ESOPHAGOGASTRODUODENOSCOPY (EGD) WITH PROPOFOL N/A 07/06/2018   Procedure: ESOPHAGOGASTRODUODENOSCOPY (EGD) WITH  PROPOFOL;  Surgeon: Clarene Essex, MD;  Location: [place];  Service: Endoscopy;  Laterality: N/A;  . EYE SURGERY Bilateral 12/14   cataracts  . HOT HEMOSTASIS N/A 07/06/2018   Procedure: HOT HEMOSTASIS (ARGON PLASMA COAGULATION/BICAP);  Surgeon: Clarene Essex, MD;  Location: [place];  Service: Endoscopy;  Laterality: N/A;  . MAXIMUM ACCESS (MAS)POSTERIOR LUMBAR INTERBODY FUSION (PLIF) 2 LEVEL N/A 09/22/2013   Procedure: FOR MAXIMUM ACCESS  POSTERIOR LUMBAR INTERBODY FUSION LUMBAR THREE-FOUR FOUR-FIVE;  Surgeon: Eustace Moore, MD;  Location: [place] NEURO ORS;  Service: Neurosurgery;  Laterality: N/A;  .  OOPHORECTOMY     wedge section not rem  . 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 REPLACEMENT TOTAL KNEE    . TEE WITHOUT CARDIOVERSION N/A 07/07/2018   Procedure: TRANSESOPHAGEAL ECHOCARDIOGRAM (TEE);  Surgeon: [name], MD;  Location: [place];  Service: Cardiovascular;  Laterality: N/A;  . TONSILLECTOMY    . TUBAL LIGATION       reports that she quit smoking about 14 years ago. Her smoking use included cigarettes. She has a 5.00 pack-year smoking history. She has never used smokeless tobacco. She reports that she drinks alcohol. She reports that she does not use drugs.  Allergies  Allergen Reactions  . Aromasin [Exemestane]     Hair loss   . Femara [Letrozole]     unknown  . Gemfibrozil Other (See Comments)    Other reaction(s): Other (See Comments) Unknown Severe fatigue  Other reaction(s): Other (See Comments) Unknown Severe fatigue Unknown  . Hydrocodone-Acetaminophen Nausea And Vomiting    Patient reports terrible nausea; plain tylenol is ok with patient  . Letrozole Other (See Comments)    Unknown reactions   . Metoprolol Diarrhea    Fatigue   . Other Other (See Comments)    Other reaction(s): Other (See Comments) Numerous cancer drugs - unknown reaction Other reaction(s): Other (See Comments) Numerous cancer drugs - unknown reaction Numerous cancer drugs - unknown reaction  . Tamoxifen     Bone and body aches   . Tetracycline Other (See Comments)    Other reaction(s): Other (See Comments) Unknown reaction Other reaction(s): Other (See Comments) Unknown reaction Unknown reaction  . Tetracyclines & Related Other (See Comments)    Vaginal infections    . Toprol Xl [Metoprolol Tartrate]     Hair loss  . Verapamil Other (See Comments)    Other reaction(s): Other (See Comments) Unknown reaction Other reaction(s): Other (See Comments) Unknown reaction Unknown reaction  . Vicodin [Hydrocodone-Acetaminophen] Nausea And Vomiting    Patient reports  terrible nausea; plain tylenol is ok with patient    Family History  Problem Relation Age of Onset  . Anesthesia problems Mother   . Diabetes Mother   . Diabetes Father     Prior to Admission medications   Medication Sig Start Date End Date Taking? Authorizing Provider  acetaminophen (TYLENOL) 500 MG tablet Take 500 mg by mouth every 6 (six) hours as needed for mild pain. For back pain   Yes [provider]  Biotin 10 MG TABS Take 10 mg by mouth 2 (two) times daily.   Yes [provider]  Calcium Carbonate-Vitamin D (CALCIUM 600 + D PO) Take 1 tablet by mouth daily.    Yes [provider]  diltiazem (CARDIZEM) 90 MG tablet Take 1 tablet (90 mg total) by mouth 2 (two) times daily. 07/14/18 08/13/18 Yes Chundi, Vahini, MD  diphenhydramine-acetaminophen (TYLENOL PM) 25-500 MG TABS tablet Take 1 tablet by mouth  at bedtime as needed (sleep).    Yes [provider]  flecainide (TAMBOCOR) 100 MG tablet Take 1 tablet (100 mg total) by mouth every 12 (twelve) hours. 07/14/18 10/12/18 Yes Chundi, Vahini, MD  glimepiride (AMARYL) 4 MG tablet Take 2 mg by mouth daily.  10/29/15  Yes [provider]  metFORMIN (GLUCOPHAGE-XR) 500 MG 24 hr tablet Take 1 tablet by mouth 2 (two) times daily. 10/31/14  Yes [provider]  Multiple Vitamin (MULITIVITAMIN WITH MINERALS) TABS Take 1 tablet by mouth daily.   Yes [provider]  pantoprazole (PROTONIX) 40 MG tablet Take 1 tablet (40 mg total) by mouth daily. 07/08/18  Yes Almyra Deforest, PA  warfarin (COUMADIN) 5 MG tablet Take 5-7.5 mg by mouth See admin instructions. 7.5 mg  Thursday. 5mg  all the other days   Yes [provider]    Physical Exam: Vitals:   07/21/18 0130 07/21/18 0230 07/21/18 0245 07/21/18 0300  BP: (!) 109/59 125/65  (!) 118/58  Pulse: (!) 119 82 81 (!) 53  Resp: 16     Temp:      TempSrc:      SpO2: 96% 97% 97% 94%      Constitutional: Moderately built and  nourished. Vitals:   07/21/18 0130 07/21/18 0230 07/21/18 0245 07/21/18 0300  BP: (!) 109/59 125/65  (!) 118/58  Pulse: (!) 119 82 81 (!) 53  Resp: 16     Temp:      TempSrc:      SpO2: 96% 97% 97% 94%   Eyes: Anicteric no pallor. ENMT: No discharge from the ears eyes nose or mouth. Neck: No mass felt.  No neck rigidity.  No JVD appreciated. Respiratory: No rhonchi or crepitations. Cardiovascular: S1-S2 heard no murmurs appreciated. Abdomen: Soft nontender bowel sounds present. Musculoskeletal: No edema. Skin: No rash. Neurologic: Alert awake oriented to time place and person.  Moves all extremities. Psychiatric: Appears normal.   Labs on Admission: I have personally reviewed following labs and imaging studies  CBC: Recent Labs  Lab 07/16/18 1218 07/21/18 0002  WBC 10.8* 8.6  NEUTROABS 8.1* 5.5  HGB 9.7* 9.8*  HCT 36.5 35.5*  MCV 62.9* 62.3*  PLT 232 782   Basic Metabolic Panel: Recent Labs  Lab 07/16/18 1218 07/21/18 0002  NA 137 139  K 4.7 4.0  CL 107 109  CO2 21* 21*  GLUCOSE 158* 189*  BUN 24* 18  CREATININE 1.39* 1.17*  CALCIUM 9.1 8.9   GFR: Estimated Creatinine Clearance: 53.4 mL/min (A) (by C-G formula based on SCr of 1.17 mg/dL (H)). Liver Function Tests: No results for input(s): AST, ALT, ALKPHOS, BILITOT, PROT, ALBUMIN in the last 168 hours. No results for input(s): LIPASE, AMYLASE in the last 168 hours. No results for input(s): AMMONIA in the last 168 hours. Coagulation Profile: Recent Labs  Lab 07/16/18 1218 07/21/18 0002  INR 2.78 2.56   Cardiac Enzymes: No results for input(s): CKTOTAL, CKMB, CKMBINDEX, TROPONINI in the last 168 hours. BNP (last 3 results) No results for input(s): PROBNP in the last 8760 hours. HbA1C: No results for input(s): HGBA1C in the last 72 hours. CBG: Recent Labs  Lab 07/14/18 0733 07/14/18 1119 07/14/18 1408 07/14/18 1619  GLUCAP 150* 110* 91 206*   Lipid Profile: No results for input(s): CHOL,  HDL, LDLCALC, TRIG, CHOLHDL, LDLDIRECT in the last 72 hours. Thyroid Function Tests: No results for input(s): TSH, T4TOTAL, FREET4, T3FREE, THYROIDAB in the last 72 hours. Anemia Panel: No results for  input(s): VITAMINB12, FOLATE, FERRITIN, TIBC, IRON, RETICCTPCT in the last 72 hours. Urine analysis:    Component Value Date/Time   COLORURINE YELLOW 12/21/2009 0851   APPEARANCEUR CLEAR 12/21/2009 0851   LABSPEC 1.028 12/21/2009 0851   PHURINE 5.0 12/21/2009 0851   GLUCOSEU NEGATIVE 12/21/2009 0851   HGBUR NEGATIVE 12/21/2009 0851   BILIRUBINUR NEGATIVE 12/21/2009 0851   KETONESUR NEGATIVE 12/21/2009 0851   PROTEINUR NEGATIVE 12/21/2009 0851   UROBILINOGEN 0.2 12/21/2009 0851   NITRITE NEGATIVE 12/21/2009 0851   LEUKOCYTESUR SMALL (A) 12/21/2009 0851   Sepsis Labs: @LABRCNTIP (procalcitonin:4,lacticidven:4) )No results found for this or any previous visit (from the past 240 hour(s)).   Radiological Exams on Admission: No results found.  EKG: Independently reviewed.  A. fib with RVR.  Assessment/Plan Principal Problem:   Atrial fibrillation with RVR (HCC) Active Problems:   Type 2 diabetes mellitus without complication (HCC)   CKD (chronic kidney disease) stage 3, GFR 30-59 ml/min (HCC)   Chronic anemia    1. A. fib with RVR -patient started on Cardizem infusion and is on Cardizem 90 mg p.o. twice daily with flecainide 100 mg p.o. twice daily.  Patient's metoprolol was just recently discontinued.  Will get cardiology consult for further advice.  Coumadin per pharmacy.  TSH and troponin and magnesium levels are pending.  Since patient recently had shortness of breath will check chest x-ray. 2. Diabetes mellitus type 2 we will keep patient on sliding scale coverage while inpatient. 3. Chronic kidney disease stage III creatinine appears to be at baseline. 4. Chronic anemia likely from renal disease follow CBC.   DVT prophylaxis: Coumadin. Code Status: Full code. Family  Communication: Discussed with patient. Disposition Plan: Home. Consults called: Cardiology. Admission status: Observation.   Rise Patience MD [facility] Pager [phone].  If 7PM-7AM, please contact night-coverage [email or web address] Password [facility]  07/21/2018, 5:19 AM

## 2018-07-22 DIAGNOSIS — I4891 Unspecified atrial fibrillation: Secondary | ICD-10-CM

## 2018-07-22 DIAGNOSIS — N289 Disorder of kidney and ureter, unspecified: Secondary | ICD-10-CM

## 2018-07-22 DIAGNOSIS — N183 Chronic kidney disease, stage 3 (moderate): Secondary | ICD-10-CM

## 2018-07-22 DIAGNOSIS — D649 Anemia, unspecified: Secondary | ICD-10-CM

## 2018-07-22 LAB — CBC
HCT: 33.8 % — ABNORMAL LOW (ref 36.0–46.0)
Hemoglobin: 9.6 g/dL — ABNORMAL LOW (ref 12.0–15.0)
MCH: 17.1 pg — ABNORMAL LOW (ref 26.0–34.0)
MCHC: 28.4 g/dL — ABNORMAL LOW (ref 30.0–36.0)
MCV: 60.4 fL — ABNORMAL LOW (ref 80.0–100.0)
Platelets: 168 10*3/uL (ref 150–400)
RBC: 5.6 MIL/uL — ABNORMAL HIGH (ref 3.87–5.11)
RDW: 19.7 % — ABNORMAL HIGH (ref 11.5–15.5)
WBC: 7.7 10*3/uL (ref 4.0–10.5)
nRBC: 0 % (ref 0.0–0.2)

## 2018-07-22 LAB — PROTIME-INR
INR: 2.52
Prothrombin Time: 26.8 seconds — ABNORMAL HIGH (ref 11.4–15.2)

## 2018-07-22 LAB — BASIC METABOLIC PANEL
Anion gap: 8 (ref 5–15)
BUN: 17 mg/dL (ref 8–23)
CO2: 22 mmol/L (ref 22–32)
Calcium: 8.9 mg/dL (ref 8.9–10.3)
Chloride: 107 mmol/L (ref 98–111)
Creatinine, Ser: 1.41 mg/dL — ABNORMAL HIGH (ref 0.44–1.00)
GFR calc Af Amer: 43 mL/min — ABNORMAL LOW (ref >60)
GFR calc non Af Amer: 37 mL/min — ABNORMAL LOW (ref >60)
Glucose, Bld: 155 mg/dL — ABNORMAL HIGH (ref 70–99)
Potassium: 4.3 mmol/L (ref 3.5–5.1)
Sodium: 137 mmol/L (ref 135–145)

## 2018-07-22 LAB — GLUCOSE, CAPILLARY
Glucose-Capillary: 222 mg/dL — ABNORMAL HIGH (ref 70–99)
Glucose-Capillary: 125 mg/dL — ABNORMAL HIGH (ref 70–99)
Glucose-Capillary: 234 mg/dL — ABNORMAL HIGH (ref 70–99)
Glucose-Capillary: 116 mg/dL — ABNORMAL HIGH (ref 70–99)

## 2018-07-22 NOTE — Progress Notes (Signed)
Progress Note  Patient Name: Jenna Mcdonald Date of Encounter: 07/22/2018  Primary Cardiologist: TT  Primary Electrophysiologist: JA   Patient Profile     72 y.o. 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 Plan is amiodarone >> RFCA anticoagulation with warfarin  Echo >> 1/18 Mod LVH LAE (45/2.1/35)  Sinus bradycardia on metoprolol now stopped   Subjective  Without cp or sob, somewhat tearful reflecting on her health issues Wondering whether she needs CT a ordered by Dr TT in setting of flecainide  Inpatient Medications    Scheduled Meds: . amiodarone  200 mg Oral BID  . diltiazem  240 mg Oral Daily  . multivitamin with minerals  1 tablet Oral Daily  . pantoprazole  40 mg Oral Daily  . warfarin  5 mg Oral Once per day on Sun Mon Tue Wed Fri Sat   And  . warfarin  7.5 mg Oral Q Thu-1800  . Warfarin - Pharmacist Dosing Inpatient   Does not apply q1800   Continuous Infusions:  PRN Meds:    Vital Signs    Vitals:   07/22/18 0035 07/22/18 0400 07/22/18 0500 07/22/18 0825  BP: (!) 136/58 (!) 140/58  (!) 144/89  Pulse: 86 80  (!) 113  Resp: 15 15  (!) 25  Temp: 97.8 F (36.6 C) 97.9 F (36.6 C)  98.4 F (36.9 C)  TempSrc: Axillary Oral  Oral  SpO2: 98% 97%  96%  Weight:   108.1 kg     Intake/Output Summary (Last 24 hours) at 07/22/2018 0840 Last data filed at 07/22/2018 0830 Gross per 24 hour  Intake 433.09 ml  Output -  Net 433.09 ml   Filed Weights   07/22/18 0500  Weight: 108.1 kg    Telemetry    AFib HR 110-130 - Personally Reviewed  ECG    Na  - Personally Reviewed  Physical Exam   GEN: No acute distress.    Neck: JVDflat Cardiac irregular RRR 2/6  Murmur Respiratory: Clear to auscultation bilaterally. GI: Soft, nontender, non-distended  MS: no edema; No deformity. Neuro:  Nonfocal  Psych: Normal affect  Skin Warm and dry   Labs    Chemistry Recent Labs  Lab 07/16/18 1218 07/21/18 0002 07/22/18 0356  NA 137 139 137  K 4.7 4.0 4.3  CL 107 109 107  CO2 21* 21* 22  GLUCOSE 158* 189* 155*  BUN 24* 18 17  CREATININE 1.39* 1.17* 1.41*  CALCIUM 9.1 8.9 8.9  GFRNONAA 37* 47* 37*  GFRAA 43* 54* 43*  ANIONGAP 9 9 8      Hematology Recent Labs  Lab 07/16/18 1218 07/21/18 0002 07/22/18 0356  WBC 10.8* 8.6 7.7  RBC 5.80* 5.70* 5.60*  HGB 9.7* 9.8* 9.6*  HCT 36.5 35.5* 33.8*  MCV 62.9* 62.3* 60.4*  MCH 16.7* 17.2* 17.1*  MCHC 26.6* 27.6* 28.4*  RDW 19.4* 19.9* 19.7*  PLT 232 177 168    Cardiac Enzymes Recent Labs  Lab 07/21/18 0638  TROPONINI <0.03    Recent Labs  Lab 07/16/18 1230 07/21/18 0010  TROPIPOC 0.01 0.00     BNP Recent Labs  Lab 07/16/18 1218  BNP  108.0*     DDimer No results for input(s): DDIMER in the last 168 hours.   Radiology    Dg Chest Port 1 View  Result Date: 07/21/2018 CLINICAL DATA:  Shortness of breath. EXAM: PORTABLE CHEST 1 VIEW COMPARISON:  Radiographs and CT 07/16/2018 FINDINGS: Unchanged mild cardiomegaly. Unchanged mediastinal contours. Stable chronic bronchitic change. No focal airspace disease, pleural effusion or pneumothorax. Unchanged osseous structures. IMPRESSION: Stable mild cardiomegaly and bronchitic change. No superimposed acute abnormality. Electronically Signed   By: Keith Rake M.D.   On: 07/21/2018 06:04    Cardiac Studies  TEE 07/07/18 normal LV function     Assessment & Plan    Afib persistent RVR  LVH unexplained  Thallasemia with microcytic anemia  Renal insufficiency gd 3  Sinus brady  OSA CPAP     Plan is to initiate amio and discharge when we have some semblence of rate control.   Have reviewed with her potential for bradycardia, but no plans per Dr Greggory Brandy re DCCV prior to RFCA  Will address next week  Also noted to have LVh unexplained, raising spectre of HCM-- son at 5 with AFib, no TTE available  Repeat TTE will be helpful and may benefit from MRI  Will cancel her CTA has was being done in context of flecainide  Follow renal function  Pharm following coumadin with initiation of amio    Signed, Virl Axe, MD  07/22/2018, 8:40 AM

## 2018-07-22 NOTE — Progress Notes (Signed)
[facility] for Coumadin Indication: atrial fibrillation  Allergies  Allergen Reactions  . Aromasin [Exemestane]     Hair loss   . Femara [Letrozole]     unknown  . Gemfibrozil Other (See Comments)    Other reaction(s): Other (See Comments) Unknown Severe fatigue  Other reaction(s): Other (See Comments) Unknown Severe fatigue Unknown  . Hydrocodone-Acetaminophen Nausea And Vomiting    Patient reports terrible nausea; plain tylenol is ok with patient  . Letrozole Other (See Comments)    Unknown reactions   . Metoprolol Diarrhea    Fatigue   . Other Other (See Comments)    Other reaction(s): Other (See Comments) Numerous cancer drugs - unknown reaction Other reaction(s): Other (See Comments) Numerous cancer drugs - unknown reaction Numerous cancer drugs - unknown reaction  . Tamoxifen     Bone and body aches   . Tetracycline Other (See Comments)    Other reaction(s): Other (See Comments) Unknown reaction Other reaction(s): Other (See Comments) Unknown reaction Unknown reaction  . Tetracyclines & Related Other (See Comments)    Vaginal infections    . Toprol Xl [Metoprolol Tartrate]     Hair loss  . Verapamil Other (See Comments)    Other reaction(s): Other (See Comments) Unknown reaction Other reaction(s): Other (See Comments) Unknown reaction Unknown reaction  . Vicodin [Hydrocodone-Acetaminophen] Nausea And Vomiting    Patient reports terrible nausea; plain tylenol is ok with patient    Patient Measurements: Weight: 238 lb 4.8 oz (108.1 kg)  Vital Signs: Temp: 98.4 F (36.9 C) (11/28 0825) Temp Source: Oral (11/28 0825) BP: 144/89 (11/28 0825) Pulse Rate: 113 (11/28 0825)  Labs: Recent Labs    07/21/18 0002 07/21/18 0638 07/22/18 0356  HGB 9.8*  --  9.6*  HCT 35.5*  --  33.8*  PLT 177  --  168  LABPROT 27.2*  --  26.8*  INR 2.56  --  2.52  CREATININE 1.17*  --  1.41*  TROPONINI  --  <0.03  --      Estimated Creatinine Clearance: 44.1 mL/min (A) (by C-G formula based on SCr of 1.41 mg/dL (H)).   Medical History: Past Medical History:  Diagnosis Date  . Anemia   . Aortic stenosis    mild by echo 08/2016, not mentioned in 06/2018 TEE  . Breast CA ([place])    left  . Carotid stenosis    1-39% left  . Degenerative disc disease, lumbar    of the knees  . Diabetes mellitus   . 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[place])   . Fatigue    Severe secondary to to gemfibrozil  . Gastroesophageal reflux disease    denies  . GI bleeding    a. 06/2018: EGD showed multiple nonbleeding duodenal ulcers. 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  . Hammertoe    left second toe  . Hypercholesteremia   . Hypertension   . Insomnia   . Irritable bowel   . Low back pain   . Metabolic syndrome   . Mild mitral regurgitation 06/2018  . Morbid obesity ([place])   . OSA on CPAP \   bipap  . Osteoarthritis   . Osteopenia   . Persistent atrial fibrillation   . Personal history of radiation therapy   . Pneumonia   . PONV (postoperative nausea and vomiting)   . Sigmoid diverticulosis   . Thalassemia minor   . Thalassemia minor   . Vitamin D  deficiency     Assessment: 72 y.o. female with afib/RVR on coumadin PTA and pharmacy consulted to dose. Amiodarone added 11/28 -INR= 2.52  Home dose: Coumadin 5 mg daily except 7.5 mg Thu  Goal of Therapy:  INR 2-3 Monitor platelets by anticoagulation protocol: Yes   Plan:  -Continue home Coumadin regimen for now -Anticipate will need ~ 30% reduction in home dose but may take some time to see the effect. Would consider reducing the home coumadin regimen after about a week -Daily INR  [name], PharmD Clinical Pharmacist **Pharmacist phone directory can now be found on [email or web address] ([email or web address]).  Listed under [place].

## 2018-07-22 NOTE — Progress Notes (Signed)
Pt went into to NSR at 2038, EKG done in chart. Will continue to monitor patient.

## 2018-07-22 NOTE — Progress Notes (Signed)
PROGRESS NOTE  Jenna Mcdonald RCV:[id number] DOB: 1946/06/22 DOA: 07/20/2018 PCP: Josetta Huddle, MD   LOS: 1 day   Brief narrative:  Jenna Mcdonald is a 72 y.o. female with history of atrial fibrillation who has had recent cardioversion and was discharged home 6 days ago presented to hospital with complaint of palpitation.  Patient does take Cardizem plecanatide and Coumadin at baseline.  Patient informed the cardiology office and took extra half of Cardizem but despite that she still had palpitations so she decided to come to the ED.  She however denied any chest pain shortness of breath dizziness or syncope.  5 days prior to this presentation patient had seen her cardiologist at the office and was noted to be short of breath and fatigued at that time so metoprolol was discontinued. In the ER patient was in A. fib with RVR and was started on Cardizem infusion.   Assessment/Plan:  Principal Problem:   Atrial fibrillation with RVR (HCC) Active Problems:   Type 2 diabetes mellitus without complication (HCC)   CKD (chronic kidney disease) stage 3, GFR 30-59 ml/min (HCC)   Chronic anemia  Atrial fibrillation with rapid ventricular response.  Patient was initially on Cardizem drip and was added on Cardizem p.o.  Flecainide has been discontinued at this time.  Patient has been seen by cardiology and has been started on amiodarone.  We will continue Coumadin as per pharmacy.  Awaiting better heart rate control.  Patient will be discharged home once heart rate is acceptable to follow-up as outpatient for DC cardioversion/catheter ablation.  Check 2D echocardiogram.  Diabetes mellitus type II.  Continue sliding scale insulin Accu-Cheks diabetic diet.  We will closely monitor blood glucose levels.  Chronic kidney disease stage III.  Creatinine appears to be at baseline.  Anemia of chronic kidney disease.  We will closely monitor CBC.  No need for blood transfusion at this time.  History of  osteoarthritis with chronic back pain.  Will provide supportive care.  History of sleep apnea on CPAP.  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  Continue on Protonix.  On Coumadin.  We will closely monitor.  VTE Prophylaxis: Coumadin  Code Status:  Full code  Family Communication: No one available at bedside  Disposition Plan: Home in 1 to 2 days.   Consultants:  Cardiology  Procedures:  None  Antibiotics:  None  Subjective: Denies any chest pain, shortness of breath, dizziness or lightheadedness.  Objective: Vitals:   07/22/18 0825 07/22/18 1111  BP: (!) 144/89 134/79  Pulse: (!) 113 (!) 112  Resp: (!) 25 17  Temp: 98.4 F (36.9 C) 98.4 F (36.9 C)  SpO2: 96% 97%    Intake/Output Summary (Last 24 hours) at 07/22/2018 1322 Last data filed at 07/22/2018 0830 Gross per 24 hour  Intake 360 ml  Output -  Net 360 ml   Filed Weights   07/22/18 0500  Weight: 108.1 kg   Physical Examination: General exam: Appears calm and comfortable ,Not in distress, nasal cannula.  Morbidly obese HEENT:PERRL,Oral mucosa moist Respiratory system: Bilateral equal air entry, normal vesicular breath sounds, no wheezes or crackles  Cardiovascular system: S1 & S2 heard, irregularly irregular rhythm with tachycardia. Gastrointestinal system: Abdomen is nondistended, soft and nontender. No organomegaly or masses felt. Normal bowel sounds heard. Central nervous system: Alert and oriented. No focal neurological deficits. Extremities: Trace lower extremity edema no clubbing ,no cyanosis, distal peripheral pulses palpable. Skin: No rashes, lesions or ulcers,no icterus ,  no pallor MSK: Normal muscle bulk,tone ,power  Data Review: I have personally reviewed the following laboratory data and studies,  CBC: Recent Labs  Lab 07/16/18 1218 07/21/18 0002 07/22/18 0356  WBC 10.8* 8.6 7.7  NEUTROABS 8.1* 5.5  --   HGB 9.7* 9.8* 9.6*  HCT 36.5 35.5* 33.8*  MCV 62.9* 62.3*  60.4*  PLT 232 177 970   Basic Metabolic Panel: Recent Labs  Lab 07/16/18 1218 07/21/18 0002 07/21/18 0638 07/22/18 0356  NA 137 139  --  137  K 4.7 4.0  --  4.3  CL 107 109  --  107  CO2 21* 21*  --  22  GLUCOSE 158* 189*  --  155*  BUN 24* 18  --  17  CREATININE 1.39* 1.17*  --  1.41*  CALCIUM 9.1 8.9  --  8.9  MG  --   --  1.8  --    Liver Function Tests: No results for input(s): AST, ALT, ALKPHOS, BILITOT, PROT, ALBUMIN in the last 168 hours. No results for input(s): LIPASE, AMYLASE in the last 168 hours. No results for input(s): AMMONIA in the last 168 hours. Cardiac Enzymes: Recent Labs  Lab 07/21/18 0638  TROPONINI <0.03   BNP (last 3 results) Recent Labs    07/16/18 1218  BNP 108.0*    ProBNP (last 3 results) No results for input(s): PROBNP in the last 8760 hours.  CBG: Recent Labs  Lab 07/21/18 1110 07/21/18 1605 07/21/18 2132 07/22/18 0823 07/22/18 1107  GLUCAP 114* 124* 136* 222* 125*   Recent Results (from the past 240 hour(s))  MRSA PCR Screening     Status: None   Collection Time: 07/21/18  4:29 AM  Result Value Ref Range Status   MRSA by PCR NEGATIVE NEGATIVE Final    Comment:        The GeneXpert MRSA Assay (FDA approved for NASAL specimens only), is one component of a comprehensive MRSA colonization surveillance program. It is not intended to diagnose MRSA infection nor to guide or monitor treatment for MRSA infections. Performed at [facility], [place] [address] [place],  [address]      Studies: Dg Chest Port 1 View  Result Date: 07/21/2018 CLINICAL DATA:  Shortness of breath. EXAM: PORTABLE CHEST 1 VIEW COMPARISON:  Radiographs and CT 07/16/2018 FINDINGS: Unchanged mild cardiomegaly. Unchanged mediastinal contours. Stable chronic bronchitic change. No focal airspace disease, pleural effusion or pneumothorax. Unchanged osseous structures. IMPRESSION: Stable mild cardiomegaly and bronchitic change. No superimposed  acute abnormality. Electronically Signed   By: Keith Rake M.D.   On: 07/21/2018 06:04    Scheduled Meds: . amiodarone  200 mg Oral BID  . diltiazem  240 mg Oral Daily  . multivitamin with minerals  1 tablet Oral Daily  . pantoprazole  40 mg Oral Daily  . warfarin  5 mg Oral Once per day on Sun Mon Tue Wed Fri Sat   And  . warfarin  7.5 mg Oral Q Thu-1800  . Warfarin - Pharmacist Dosing Inpatient   Does not apply q1800   Continuous Infusions:  Time spent: 25 minutes. More than 50% of that time was spent in counseling and/or coordination of care.  Vici Novick  [facility] Pager [phone]  If 7PM-7AM, please contact night-coverage at [email or web address], password [facility] 07/22/2018, 1:22 PM

## 2018-07-23 ENCOUNTER — Telehealth: Payer: Self-pay | Admitting: Physician Assistant

## 2018-07-23 ENCOUNTER — Inpatient Hospital Stay (HOSPITAL_COMMUNITY): Payer: Medicare Other

## 2018-07-23 DIAGNOSIS — D509 Iron deficiency anemia, unspecified: Secondary | ICD-10-CM

## 2018-07-23 DIAGNOSIS — I480 Paroxysmal atrial fibrillation: Secondary | ICD-10-CM

## 2018-07-23 LAB — PROTIME-INR
INR: 2.63
Prothrombin Time: 27.8 seconds — ABNORMAL HIGH (ref 11.4–15.2)

## 2018-07-23 LAB — BASIC METABOLIC PANEL
Anion gap: 8 (ref 5–15)
BUN: 19 mg/dL (ref 8–23)
CO2: 21 mmol/L — ABNORMAL LOW (ref 22–32)
Calcium: 8.7 mg/dL — ABNORMAL LOW (ref 8.9–10.3)
Chloride: 109 mmol/L (ref 98–111)
Creatinine, Ser: 1.26 mg/dL — ABNORMAL HIGH (ref 0.44–1.00)
GFR calc Af Amer: 49 mL/min — ABNORMAL LOW (ref >60)
GFR calc non Af Amer: 43 mL/min — ABNORMAL LOW (ref >60)
Glucose, Bld: 154 mg/dL — ABNORMAL HIGH (ref 70–99)
Potassium: 4.3 mmol/L (ref 3.5–5.1)
Sodium: 138 mmol/L (ref 135–145)

## 2018-07-23 LAB — CBC
HCT: 32.4 % — ABNORMAL LOW (ref 36.0–46.0)
Hemoglobin: 8.9 g/dL — ABNORMAL LOW (ref 12.0–15.0)
MCH: 16.9 pg — ABNORMAL LOW (ref 26.0–34.0)
MCHC: 27.5 g/dL — ABNORMAL LOW (ref 30.0–36.0)
MCV: 61.5 fL — ABNORMAL LOW (ref 80.0–100.0)
Platelets: 148 10*3/uL — ABNORMAL LOW (ref 150–400)
RBC: 5.27 MIL/uL — ABNORMAL HIGH (ref 3.87–5.11)
RDW: 18.7 % — ABNORMAL HIGH (ref 11.5–15.5)
WBC: 6.5 10*3/uL (ref 4.0–10.5)
nRBC: 0 % (ref 0.0–0.2)

## 2018-07-23 LAB — GLUCOSE, CAPILLARY
Glucose-Capillary: 135 mg/dL — ABNORMAL HIGH (ref 70–99)
Glucose-Capillary: 122 mg/dL — ABNORMAL HIGH (ref 70–99)

## 2018-07-23 LAB — ECHOCARDIOGRAM COMPLETE: Weight: 3849.6 oz

## 2018-07-23 MED ORDER — WARFARIN SODIUM 4 MG PO TABS: 4.0000 mg | ORAL_TABLET | Freq: Every day | ORAL | 0 refills | Status: DC

## 2018-07-23 MED ORDER — DILTIAZEM HCL ER COATED BEADS 240 MG PO CP24: 240.0000 mg | ORAL_CAPSULE | Freq: Every day | ORAL | 0 refills | Status: DC

## 2018-07-23 MED ORDER — PNEUMOCOCCAL VAC POLYVALENT 25 MCG/0.5ML IJ INJ: 0.5000 mL | INJECTION | INTRAMUSCULAR | Status: DC

## 2018-07-23 MED ORDER — AMIODARONE HCL 200 MG PO TABS: 200.0000 mg | ORAL_TABLET | Freq: Two times a day (BID) | ORAL | 0 refills | Status: DC

## 2018-07-23 NOTE — Progress Notes (Addendum)
Progress Note  Patient Name: Jenna Mcdonald Date of Encounter: 07/23/2018  Primary Cardiologist: Fransico Him, MD   Subjective   States she could tell immediately when she went back to normal rhythm, feels well this morning  Inpatient Medications    Scheduled Meds: . amiodarone  200 mg Oral BID  . diltiazem  240 mg Oral Daily  . multivitamin with minerals  1 tablet Oral Daily  . pantoprazole  40 mg Oral Daily  . warfarin  5 mg Oral Once per day on Sun Mon Tue Wed Fri Sat   And  . warfarin  7.5 mg Oral Q Thu-1800  . Warfarin - Pharmacist Dosing Inpatient   Does not apply q1800   Continuous Infusions:  PRN Meds:    Vital Signs    Vitals:   07/22/18 2006 07/23/18 0007 07/23/18 0413 07/23/18 0640  BP: 106/63 (!) 147/59 116/71   Pulse: 87 79 69   Resp: (!) 21 18 14    Temp: 98.4 F (36.9 C)  98.6 F (37 C)   TempSrc: Oral  Oral   SpO2: 97%  97%   Weight:    109.1 kg    Intake/Output Summary (Last 24 hours) at 07/23/2018 0759 Last data filed at 07/22/2018 1928 Gross per 24 hour  Intake 840 ml  Output -  Net 840 ml   Filed Weights   07/22/18 0500 07/23/18 0640  Weight: 108.1 kg 109.1 kg    Telemetry    AF >> SR rates 60's-70's - Personally Reviewed  ECG    SR 73bpm - Personally Reviewed  Physical Exam   GEN: No acute distress.   Neck: No JVD Cardiac: RRR, 1/6 SM, rubs, or gallops.  Respiratory: CTA b/l GI: Soft, nontender, non-distended  MS: No edema; No deformity. Neuro:  Nonfocal  Psych: Normal affect   Labs    Chemistry Recent Labs  Lab 07/21/18 0002 07/22/18 0356 07/23/18 0426  NA 139 137 138  K 4.0 4.3 4.3  CL 109 107 109  CO2 21* 22 21*  GLUCOSE 189* 155* 154*  BUN 18 17 19   CREATININE 1.17* 1.41* 1.26*  CALCIUM 8.9 8.9 8.7*  GFRNONAA 47* 37* 43*  GFRAA 54* 43* 49*  ANIONGAP 9 8 8      Hematology Recent Labs  Lab 07/21/18 0002 07/22/18 0356 07/23/18 0426  WBC 8.6 7.7 6.5  RBC 5.70* 5.60* 5.27*  HGB 9.8* 9.6*  8.9*  HCT 35.5* 33.8* 32.4*  MCV 62.3* 60.4* 61.5*  MCH 17.2* 17.1* 16.9*  MCHC 27.6* 28.4* 27.5*  RDW 19.9* 19.7* 18.7*  PLT 177 168 148*    Cardiac Enzymes Recent Labs  Lab 07/21/18 0638  TROPONINI <0.03    Recent Labs  Lab 07/16/18 1230 07/21/18 0010  TROPIPOC 0.01 0.00     BNP Recent Labs  Lab 07/16/18 1218  BNP 108.0*     DDimer No results for input(s): DDIMER in the last 168 hours.   Radiology    No results found.  Cardiac Studies   07/07/18: TEE Study Conclusions - Left ventricle: Systolic function was normal. The estimated   ejection fraction was in the range of 55% to 60%. No evidence of   thrombus. - Aortic valve: No evidence of vegetation. - Mitral valve: No evidence of vegetation. There was mild   regurgitation. - Left atrium: No evidence of thrombus in the atrial cavity or   appendage. No evidence of thrombus in the atrial cavity or   appendage. No evidence  of thrombus in the atrial cavity or   appendage. - Right atrium: No evidence of thrombus in the atrial cavity or   appendage. - Atrial septum: There was a patent foramen ovale with flow noted   by color flow Doppler. - Tricuspid valve: No evidence of vegetation. - Pulmonic valve: No evidence of vegetation. Impressions: - Successful cardioversion. No cardiac source of emboli was   indentified.  09/06/16: TTE Study Conclusions - Left ventricle: The cavity size was normal. Wall thickness was   increased in a pattern of moderate LVH. Systolic function was   normal. The estimated ejection fraction was in the range of 55%   to 60%. Wall motion was normal; there were no regional wall   motion abnormalities. Left ventricular diastolic function   parameters were normal. - Aortic valve: There was very mild stenosis. - Mitral valve: nodular calcification of anterior mitral leaflet.   Calcified annulus. Mildly thickened leaflets . - Left atrium: The atrium was moderately dilated. (10mm) -  Atrial septum: No defect or patent foramen ovale was identified.  09/27/15: 48 hour monitor    Study Highlights    Normal sinus rhythm and sinus tachycardia with average heart rate 85bpm. The heart rate ranged from 65-132bpm.  Occasional PVCs  Frequent PACs and nonsutained atrial tachycardia up to 10 beats.     Patient Profile     72 y.o. female with a hx of DM, HTN, OA w/chronic back pain, OSA w/BIPAP, morbid obesity, thalassemia minor, CKD, and AFib admitted with recurrent AFib w/RVR  Assessment & Plan     1. Paroxysmal AFib     CHA2DS2Vasc is 4, on warfarin (notes report patient preference)  AF had been quiet for the last couple years.  Resurfacing of late becoming frustrating, and quite symptomatic. Failed flecainide, now on amiodarone, converting to SR overnight.  INR 2.63 this AM  2. Mod LVH by echo in Jan 2018     Not discussed/described in TEE report     Updated echo ordered for today  3. CKD (III) 4. Sinus bradycardia     (Noted with dilt and BB) 5. OSA     Compliant with her home BIPAP 6. Thallasemia   Will have her echo done today, though will not need to wait on read. I anticipate being able to discharge after being seen by Dr. Caryl Comes today    For questions or updates, please contact [facility] Please consult [email or web address] for contact info under        Signed, Baldwin Jamaica, PA-C  07/23/2018, 7:59 AM     Patient has reverted to sinus rhythm.  Low-dose amiodarone may contribute to bradycardia but not following his initiation.  Will be seen next week in A. fib clinic.  May need to down titrate diltiazem.  Discussed the drug interaction between amiodarone and warfarin.  Would anticipate discharge on a 25% lower dose of warfarin with INR check next week with her PCP.  Can be discharged prior to her echo report being done; its information is important for longer-term management i.e. left atrial size and confirmation of hypertrophy with a  question raised of hypertrophic cardiomyopathy to be resolved subsequently

## 2018-07-23 NOTE — Discharge Summary (Signed)
Physician Discharge Summary  Jenna Mcdonald QIO:[id number] DOB: 05/05/1946 DOA: 07/20/2018  PCP: Josetta Huddle, MD  Admit date: 07/20/2018  Discharge date: 07/23/2018  Admitted From: Home  Disposition:  Home  Discharge Condition: Stable  CODE STATUS:  Full  Diet recommendation:  Heart Healthy   Brief/Interim Summary:  Jenna Olejniczak Rowlandis a 72 y.o.femalewithhistory of atrial fibrillation who has had recent cardioversion and was discharged home 6 days ago presented to hospital with complaint of palpitation.  Patient does take Cardizem, flecanatide and Coumadin at baseline.  Patient informed the cardiology office and took extra half of Cardizem but despite that she still had palpitations so she decided to come to the ED.  She however denied any chest pain, shortness of breath, dizziness or syncope. In the ER patient was in A. fib with RVR and was started on Cardizem infusion.   Cardiology was consulted and patient was started on amiodarone.  Flecainide was discontinued and Cardizem dose was adjusted.  Her other chronic medical conditions including diabetes mellitus, chronic kidney disease, anemia of chronic disease, history of sleep apnea on CPAP.  Patient had converted while in the hospital and was in normal sinus rhythm prior to discharge.  Electrophysiology cardiology has also seen the patient and recommend outpatient follow-up with cardiology clinic in 1 week.  Patient was instructed on changes in her medication regimen including decrease in her Coumadin doses.  2D echocardiogram was ordered by cardiology which will be followed by cardiology after discharge.  Discharge Diagnoses:  Principal Problem:   Atrial fibrillation with RVR ([place])- resolved Active Problems:   Type 2 diabetes mellitus without complication (HCC)   CKD (chronic kidney disease) stage 3, GFR 30-59 ml/min (HCC)   Chronic anemia  Discharge Instructions  Discharge Instructions    Diet - low sodium heart healthy    Complete by:  As directed    Discharge instructions   Complete by:  As directed    Follow up with your primary care clinic in 3-4 days for coumadin checkup (INR). Dose of coumadin is 4 mg on all days except 5 mg on Thursday. Cardizem dose has been changed and amiodarone has been prescribed. Follow up with cardiology (as scheduled by the clinic).   Increase activity slowly   Complete by:  As directed      Allergies as of 07/23/2018      Reactions   Aromasin [exemestane]    Hair loss   Femara [letrozole]    unknown   Gemfibrozil Other (See Comments)   Other reaction(s): Other (See Comments) Unknown Severe fatigue Other reaction(s): Other (See Comments) Unknown Severe fatigue Unknown   Hydrocodone-acetaminophen Nausea And Vomiting   Patient reports terrible nausea; plain tylenol is ok with patient   Letrozole Other (See Comments)   Unknown reactions    Metoprolol Diarrhea   Fatigue   Other Other (See Comments)   Other reaction(s): Other (See Comments) Numerous cancer drugs - unknown reaction Other reaction(s): Other (See Comments) Numerous cancer drugs - unknown reaction Numerous cancer drugs - unknown reaction   Tamoxifen    Bone and body aches   Tetracycline Other (See Comments)   Other reaction(s): Other (See Comments) Unknown reaction Other reaction(s): Other (See Comments) Unknown reaction Unknown reaction   Tetracyclines & Related Other (See Comments)   Vaginal infections    Toprol Xl [metoprolol Tartrate]    Hair loss   Verapamil Other (See Comments)   Other reaction(s): Other (See Comments) Unknown reaction Other reaction(s): Other (See Comments)  Unknown reaction Unknown reaction   Vicodin [hydrocodone-acetaminophen] Nausea And Vomiting   Patient reports terrible nausea; plain tylenol is ok with patient      Medication List    STOP taking these medications   diltiazem 90 MG tablet Commonly known as:  CARDIZEM   flecainide 100 MG tablet Commonly  known as:  TAMBOCOR     TAKE these medications   acetaminophen 500 MG tablet Commonly known as:  TYLENOL Take 500 mg by mouth every 6 (six) hours as needed for mild pain. For back pain   amiodarone 200 MG tablet Commonly known as:  PACERONE Take 1 tablet (200 mg total) by mouth 2 (two) times daily.   Biotin 10 MG Tabs Take 10 mg by mouth 2 (two) times daily.   CALCIUM 600 + D PO Take 1 tablet by mouth daily.   diltiazem 240 MG 24 hr capsule Commonly known as:  CARDIZEM CD Take 1 capsule (240 mg total) by mouth daily. Start taking on:  07/24/2018   diphenhydramine-acetaminophen 25-500 MG Tabs tablet Commonly known as:  TYLENOL PM Take 1 tablet by mouth at bedtime as needed (sleep).   glimepiride 4 MG tablet Commonly known as:  AMARYL Take 2 mg by mouth daily.   metFORMIN 500 MG 24 hr tablet Commonly known as:  GLUCOPHAGE-XR Take 1 tablet by mouth 2 (two) times daily.   multivitamin with minerals Tabs tablet Take 1 tablet by mouth daily.   pantoprazole 40 MG tablet Commonly known as:  PROTONIX Take 1 tablet (40 mg total) by mouth daily.   warfarin 4 MG tablet Commonly known as:  COUMADIN Take 1 tablet (4 mg total) by mouth daily at 6 PM. (5 mg  Thursday. 4 mg all the other days) What changed:    medication strength  how much to take  when to take this  additional instructions      Follow-up Information    [facility].   Specialty:  Cardiology Why:  you will be called by the clinic to schedule a follow up visit.  This should be done in the next 7-10 days Contact information: [address] [id number] [place] [address] [phone]         Allergies  Allergen Reactions  . Aromasin [Exemestane]     Hair loss   . Femara [Letrozole]     unknown  . Gemfibrozil Other (See Comments)    Other reaction(s): Other (See Comments) Unknown Severe fatigue  Other reaction(s): Other (See  Comments) Unknown Severe fatigue Unknown  . Hydrocodone-Acetaminophen Nausea And Vomiting    Patient reports terrible nausea; plain tylenol is ok with patient  . Letrozole Other (See Comments)    Unknown reactions   . Metoprolol Diarrhea    Fatigue   . Other Other (See Comments)    Other reaction(s): Other (See Comments) Numerous cancer drugs - unknown reaction Other reaction(s): Other (See Comments) Numerous cancer drugs - unknown reaction Numerous cancer drugs - unknown reaction  . Tamoxifen     Bone and body aches   . Tetracycline Other (See Comments)    Other reaction(s): Other (See Comments) Unknown reaction Other reaction(s): Other (See Comments) Unknown reaction Unknown reaction  . Tetracyclines & Related Other (See Comments)    Vaginal infections    . Toprol Xl [Metoprolol Tartrate]     Hair loss  . Verapamil Other (See Comments)    Other reaction(s): Other (See Comments) Unknown reaction Other reaction(s):  Other (See Comments) Unknown reaction Unknown reaction  . Vicodin [Hydrocodone-Acetaminophen] Nausea And Vomiting    Patient reports terrible nausea; plain tylenol is ok with patient    Consultations: Electrophysiology cardiology, general cardiology  Procedures/Studies: Ct Angio Chest Pe W And/or Wo Contrast  Result Date: 07/16/2018 CLINICAL DATA:  Shortness of breath. EXAM: CT ANGIOGRAPHY CHEST WITH CONTRAST TECHNIQUE: Multidetector CT imaging of the chest was performed using the standard protocol during bolus administration of intravenous contrast. Multiplanar CT image reconstructions and MIPs were obtained to evaluate the vascular anatomy. CONTRAST:  75 mL ISOVUE-370 IOPAMIDOL (ISOVUE-370) INJECTION 76% COMPARISON:  Radiograph of July 16, 2018. FINDINGS: Cardiovascular: Satisfactory opacification of the pulmonary arteries to the segmental level. No evidence of pulmonary embolism. Normal heart size. No pericardial effusion. Atherosclerosis of thoracic  aorta is noted. Mediastinum/Nodes: No enlarged mediastinal, hilar, or axillary lymph nodes. Thyroid gland, trachea, and esophagus demonstrate no significant findings. Lungs/Pleura: Lungs are clear. No pleural effusion or pneumothorax. Upper Abdomen: No acute abnormality. Musculoskeletal: No chest wall abnormality. No acute or significant osseous findings. Review of the MIP images confirms the above findings. IMPRESSION: No definite evidence of pulmonary embolus. No acute abnormality seen in the chest. Aortic Atherosclerosis (ICD10-I70.0). Electronically Signed   By: Marijo Conception, M.D.   On: 07/16/2018 14:17   Dg Chest Port 1 View  Result Date: 07/21/2018 CLINICAL DATA:  Shortness of breath. EXAM: PORTABLE CHEST 1 VIEW COMPARISON:  Radiographs and CT 07/16/2018 FINDINGS: Unchanged mild cardiomegaly. Unchanged mediastinal contours. Stable chronic bronchitic change. No focal airspace disease, pleural effusion or pneumothorax. Unchanged osseous structures. IMPRESSION: Stable mild cardiomegaly and bronchitic change. No superimposed acute abnormality. Electronically Signed   By: Keith Rake M.D.   On: 07/21/2018 06:04   Dg Chest Port 1 View  Result Date: 07/16/2018 CLINICAL DATA:  Shortness of breath EXAM: PORTABLE CHEST 1 VIEW COMPARISON:  07/02/2018 FINDINGS: Cardiomegaly accentuated by low volumes. Stable interstitial coarsening. Artifact from EKG leads. There is no edema, consolidation, effusion, or pneumothorax. IMPRESSION: No acute finding. Electronically Signed   By: Monte Fantasia M.D.   On: 07/16/2018 12:50   Dg Chest Port 1 View  Result Date: 07/02/2018 CLINICAL DATA:  Cough. EXAM: PORTABLE CHEST 1 VIEW COMPARISON:  12/04/2015 FINDINGS: There is peribronchial thickening with slight interstitial accentuation at the lung bases without acute infiltrates or effusions. Heart size and pulmonary vascularity are normal. No significant bone abnormality. IMPRESSION: Bronchitic changes.  Electronically Signed   By: Lorriane Shire M.D.   On: 07/02/2018 11:08      Subjective: Feels much better today.  Denies any chest pain, dizziness, palpitation.  Discharge Exam: Vitals:   07/23/18 0700 07/23/18 1246  BP: (!) 130/56 123/65  Pulse:  80  Resp:  17  Temp: 98.2 F (36.8 C) 98.1 F (36.7 C)  SpO2: 96% 97%   Vitals:   07/23/18 0413 07/23/18 0640 07/23/18 0700 07/23/18 1246  BP: 116/71  (!) 130/56 123/65  Pulse: 69   80  Resp: 14   17  Temp: 98.6 F (37 C)  98.2 F (36.8 C) 98.1 F (36.7 C)  TempSrc: Oral  Oral Oral  SpO2: 97%  96% 97%  Weight:  109.1 kg     General: Pt is alert, awake, not in acute distress, morbidly obese Cardiovascular: RRR, S1/S2 +, no rubs, no gallops Respiratory: CTA bilaterally, no wheezing, no rhonchi Abdominal: Soft, NT, ND, bowel sounds + CNS: non focal. Extremities: no edema, no cyanosis  The results of significant  diagnostics from this hospitalization (including imaging, microbiology, ancillary and laboratory) are listed below for reference.    Microbiology: Recent Results (from the past 240 hour(s))  MRSA PCR Screening     Status: None   Collection Time: 07/21/18  4:29 AM  Result Value Ref Range Status   MRSA by PCR NEGATIVE NEGATIVE Final    Comment:        The GeneXpert MRSA Assay (FDA approved for NASAL specimens only), is one component of a comprehensive MRSA colonization surveillance program. It is not intended to diagnose MRSA infection nor to guide or monitor treatment for MRSA infections. Performed at [facility], [place] [address] [place], [place] [address]      Labs: BNP (last 3 results) Recent Labs    07/16/18 1218  BNP 096.2*   Basic Metabolic Panel: Recent Labs  Lab 07/21/18 0002 07/21/18 0638 07/22/18 0356 07/23/18 0426  NA 139  --  137 138  K 4.0  --  4.3 4.3  CL 109  --  107 109  CO2 21*  --  22 21*  GLUCOSE 189*  --  155* 154*  BUN 18  --  17 19  CREATININE 1.17*  --  1.41*  1.26*  CALCIUM 8.9  --  8.9 8.7*  MG  --  1.8  --   --    Liver Function Tests: No results for input(s): AST, ALT, ALKPHOS, BILITOT, PROT, ALBUMIN in the last 168 hours. No results for input(s): LIPASE, AMYLASE in the last 168 hours. No results for input(s): AMMONIA in the last 168 hours. CBC: Recent Labs  Lab 07/21/18 0002 07/22/18 0356 07/23/18 0426  WBC 8.6 7.7 6.5  NEUTROABS 5.5  --   --   HGB 9.8* 9.6* 8.9*  HCT 35.5* 33.8* 32.4*  MCV 62.3* 60.4* 61.5*  PLT 177 168 148*   Cardiac Enzymes: Recent Labs  Lab 07/21/18 0638  TROPONINI <0.03   BNP: Invalid input(s): POCBNP CBG: Recent Labs  Lab 07/22/18 1107 07/22/18 1633 07/22/18 2128 07/23/18 0738 07/23/18 1106  GLUCAP 125* 234* 116* 135* 122*   D-Dimer No results for input(s): DDIMER in the last 72 hours. Hgb A1c No results for input(s): HGBA1C in the last 72 hours. Lipid Profile No results for input(s): CHOL, HDL, LDLCALC, TRIG, CHOLHDL, LDLDIRECT in the last 72 hours. Thyroid function studies Recent Labs    07/21/18 0638  TSH 3.295   Anemia work up No results for input(s): VITAMINB12, FOLATE, FERRITIN, TIBC, IRON, RETICCTPCT in the last 72 hours. Urinalysis    Component Value Date/Time   COLORURINE YELLOW 12/21/2009 [place] 12/21/2009 0851   LABSPEC 1.028 12/21/2009 0851   PHURINE 5.0 12/21/2009 0851   GLUCOSEU NEGATIVE 12/21/2009 0851   HGBUR NEGATIVE 12/21/2009 0851   BILIRUBINUR NEGATIVE 12/21/2009 0851   KETONESUR NEGATIVE 12/21/2009 0851   PROTEINUR NEGATIVE 12/21/2009 0851   UROBILINOGEN 0.2 12/21/2009 0851   NITRITE NEGATIVE 12/21/2009 0851   LEUKOCYTESUR SMALL (A) 12/21/2009 0851   Sepsis Labs Invalid input(s): PROCALCITONIN,  WBC,  LACTICIDVEN Microbiology Recent Results (from the past 240 hour(s))  MRSA PCR Screening     Status: None   Collection Time: 07/21/18  4:29 AM  Result Value Ref Range Status   MRSA by PCR NEGATIVE NEGATIVE Final    Comment:         The GeneXpert MRSA Assay (FDA approved for NASAL specimens only), is one component of a comprehensive MRSA colonization surveillance program. It is  not intended to diagnose MRSA infection nor to guide or monitor treatment for MRSA infections. Performed at [facility], [place] [address] [place], [place] [address]     Please note: You were cared for by a hospitalist during your hospital stay. Once you are discharged, your primary care physician will handle any further medical issues.   Time coordinating discharge: 40 minutes  SIGNED:  Flora Lipps, MD  [facility] 07/23/2018, 12:58 PM

## 2018-07-23 NOTE — Progress Notes (Signed)
Benefits check submitted for both Tikosyn and Amiodarone- may be a delay due to holiday.

## 2018-07-23 NOTE — Progress Notes (Signed)
  Echocardiogram 2D Echocardiogram has been performed.  Joelene Millin 07/23/2018, 12:02 PM

## 2018-07-23 NOTE — Telephone Encounter (Signed)
FYI - Dr. Caryl Comes followed this patient in the hospital and indicated "Will cancel her CTA has was being done in context of flecainide" as the patient asked him about this. So for now please cancel study. Thanks. Petrita Blunck PA-C

## 2018-07-23 NOTE — Progress Notes (Signed)
I have staff messaged the AFib clinic (closed today) to call the patient with a follow up visit for the next 7-10 days.  Tommye Standard, PA-C

## 2018-07-23 NOTE — Progress Notes (Signed)
Jenna Mcdonald for Coumadin Indication: atrial fibrillation  Allergies  Allergen Reactions  . Aromasin [Exemestane]     Hair loss   . Femara [Letrozole]     unknown  . Gemfibrozil Other (See Comments)    Other reaction(s): Other (See Comments) Unknown Severe fatigue  Other reaction(s): Other (See Comments) Unknown Severe fatigue Unknown  . Hydrocodone-Acetaminophen Nausea And Vomiting    Patient reports terrible nausea; plain tylenol is ok with patient  . Letrozole Other (See Comments)    Unknown reactions   . Metoprolol Diarrhea    Fatigue   . Other Other (See Comments)    Other reaction(s): Other (See Comments) Numerous cancer drugs - unknown reaction Other reaction(s): Other (See Comments) Numerous cancer drugs - unknown reaction Numerous cancer drugs - unknown reaction  . Tamoxifen     Bone and body aches   . Tetracycline Other (See Comments)    Other reaction(s): Other (See Comments) Unknown reaction Other reaction(s): Other (See Comments) Unknown reaction Unknown reaction  . Tetracyclines & Related Other (See Comments)    Vaginal infections    . Toprol Xl [Metoprolol Tartrate]     Hair loss  . Verapamil Other (See Comments)    Other reaction(s): Other (See Comments) Unknown reaction Other reaction(s): Other (See Comments) Unknown reaction Unknown reaction  . Vicodin [Hydrocodone-Acetaminophen] Nausea And Vomiting    Patient reports terrible nausea; plain tylenol is ok with patient    Patient Measurements: Weight: 240 lb 9.6 oz (109.1 kg)  Vital Signs: Temp: 98.2 F (36.8 C) (11/29 0700) Temp Source: Oral (11/29 0700) BP: 130/56 (11/29 0700) Pulse Rate: 69 (11/29 0413)  Labs: Recent Labs    07/21/18 0002 07/21/18 6568 07/22/18 0356 07/23/18 0426  HGB 9.8*  --  9.6* 8.9*  HCT 35.5*  --  33.8* 32.4*  PLT 177  --  168 148*  LABPROT 27.2*  --  26.8* 27.8*  INR 2.56  --  2.52 2.63  CREATININE 1.17*  --   1.41* 1.26*  TROPONINI  --  <0.03  --   --     Estimated Creatinine Clearance: 49.6 mL/min (A) (by C-G formula based on SCr of 1.26 mg/dL (H)).   Medical History: Past Medical History:  Diagnosis Date  . Anemia   . Aortic stenosis    mild by echo 08/2016, not mentioned in 06/2018 TEE  . Breast CA ([place])    left  . Carotid stenosis    1-39% left  . Degenerative disc disease, lumbar    of the knees  . Diabetes mellitus   . 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[place])   . Fatigue    Severe secondary to to gemfibrozil  . Gastroesophageal reflux disease    denies  . GI bleeding    a. 06/2018: EGD showed multiple nonbleeding duodenal ulcers. 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  . Hammertoe    left second toe  . Hypercholesteremia   . Hypertension   . Insomnia   . Irritable bowel   . Low back pain   . Metabolic syndrome   . Mild mitral regurgitation 06/2018  . Morbid obesity ([place])   . OSA on CPAP \   bipap  . Osteoarthritis   . Osteopenia   . Persistent atrial fibrillation   . Personal history of radiation therapy   . Pneumonia   . PONV (postoperative nausea and vomiting)   . Sigmoid diverticulosis   . Thalassemia  minor   . Thalassemia minor   . Vitamin D deficiency     Assessment: 72 y.o. female with afib/RVR on coumadin PTA and pharmacy consulted to dose. Amiodarone added 11/28 -INR= 2.52  Home dose: Coumadin 5 mg daily except 7.5 mg Thu (37.5mg /wk)  Goal of Therapy:  INR 2-3 Monitor platelets by anticoagulation protocol: Yes   Plan:  -Continue home Coumadin regimen for now -Anticipate will need ~ 30% reduction in home dose but may take some time to see the effect. Would consider reducing the home coumadin regimen after about a week (would consider 5mg /day except take 2.5 mg on MWF; 27.5mg /wk ) -Daily INR  [name], PharmD Clinical Pharmacist **Pharmacist phone directory can now be found on [email or web address]  (PW [email or web address]).  Listed under [place].

## 2018-07-23 NOTE — Care Management Note (Signed)
Case Management Note RN CM holiday cross coverage for 6E [phone]  Patient Details  Name: Jenna Mcdonald MRN: [id number] Date of Birth: October 05, 1945  Subjective/Objective:   Pt admitted with afib                 Action/Plan: PTA pt lived at home, plan to transition back home, referral received for coverage info on Tikosyn vs Amiodarone. Due to holiday unable to complete. Noted pt to go home on Amiodarone. No further CM needs noted for transition home.   Expected Discharge Date:  07/23/18               Expected Discharge Plan:  Home/Self Care  In-House Referral:     Discharge planning Services  CM Consult, Medication Assistance  Post Acute Care Choice:  NA Choice offered to:  NA  DME Arranged:    DME Agency:     HH Arranged:    HH Agency:     Status of Service:  Completed, signed off  If discussed at [place] of Stay Meetings, dates discussed:    Discharge Disposition: home/self care   Additional Comments:  Dawayne Tikisha, RN 07/23/2018, 1:49 PM

## 2018-07-26 ENCOUNTER — Ambulatory Visit: Payer: Medicare Other | Admitting: Physician Assistant

## 2018-07-30 ENCOUNTER — Other Ambulatory Visit: Payer: Self-pay | Admitting: Physician Assistant

## 2018-08-02 ENCOUNTER — Ambulatory Visit (HOSPITAL_COMMUNITY)
Admission: RE | Admit: 2018-08-02 | Discharge: 2018-08-02 | Disposition: A | Discharge status: Home / Self Care | Payer: Medicare Other | Source: Ambulatory Visit | Attending: Nurse Practitioner | Admitting: Nurse Practitioner

## 2018-08-02 ENCOUNTER — Encounter (HOSPITAL_COMMUNITY): Payer: Self-pay | Admitting: Nurse Practitioner

## 2018-08-02 VITALS — BP 136/58 | HR 136 | Ht 65.0 in | Wt 245.0 lb

## 2018-08-02 DIAGNOSIS — K219 Gastro-esophageal reflux disease without esophagitis: Secondary | ICD-10-CM | POA: Insufficient documentation

## 2018-08-02 DIAGNOSIS — K589 Irritable bowel syndrome without diarrhea: Secondary | ICD-10-CM | POA: Diagnosis not present

## 2018-08-02 DIAGNOSIS — M5136 Other intervertebral disc degeneration, lumbar region: Secondary | ICD-10-CM | POA: Diagnosis not present

## 2018-08-02 DIAGNOSIS — I4819 Other persistent atrial fibrillation: Secondary | ICD-10-CM | POA: Diagnosis present

## 2018-08-02 DIAGNOSIS — Z7984 Long term (current) use of oral hypoglycemic drugs: Secondary | ICD-10-CM | POA: Insufficient documentation

## 2018-08-02 DIAGNOSIS — I10 Essential (primary) hypertension: Secondary | ICD-10-CM | POA: Diagnosis not present

## 2018-08-02 DIAGNOSIS — G4700 Insomnia, unspecified: Secondary | ICD-10-CM | POA: Insufficient documentation

## 2018-08-02 DIAGNOSIS — I350 Nonrheumatic aortic (valve) stenosis: Secondary | ICD-10-CM | POA: Diagnosis not present

## 2018-08-02 DIAGNOSIS — D563 Thalassemia minor: Secondary | ICD-10-CM | POA: Diagnosis not present

## 2018-08-02 DIAGNOSIS — Z881 Allergy status to other antibiotic agents status: Secondary | ICD-10-CM | POA: Insufficient documentation

## 2018-08-02 DIAGNOSIS — Z7901 Long term (current) use of anticoagulants: Secondary | ICD-10-CM | POA: Diagnosis not present

## 2018-08-02 DIAGNOSIS — Z6841 Body Mass Index (BMI) 40.0 and over, adult: Secondary | ICD-10-CM | POA: Insufficient documentation

## 2018-08-02 DIAGNOSIS — Z87891 Personal history of nicotine dependence: Secondary | ICD-10-CM | POA: Diagnosis not present

## 2018-08-02 DIAGNOSIS — E7800 Pure hypercholesterolemia, unspecified: Secondary | ICD-10-CM | POA: Diagnosis not present

## 2018-08-02 DIAGNOSIS — Z853 Personal history of malignant neoplasm of breast: Secondary | ICD-10-CM | POA: Diagnosis not present

## 2018-08-02 DIAGNOSIS — Z885 Allergy status to narcotic agent status: Secondary | ICD-10-CM | POA: Insufficient documentation

## 2018-08-02 DIAGNOSIS — E559 Vitamin D deficiency, unspecified: Secondary | ICD-10-CM | POA: Diagnosis not present

## 2018-08-02 DIAGNOSIS — G4733 Obstructive sleep apnea (adult) (pediatric): Secondary | ICD-10-CM | POA: Insufficient documentation

## 2018-08-02 DIAGNOSIS — E119 Type 2 diabetes mellitus without complications: Secondary | ICD-10-CM | POA: Diagnosis not present

## 2018-08-02 DIAGNOSIS — Z886 Allergy status to analgesic agent status: Secondary | ICD-10-CM | POA: Insufficient documentation

## 2018-08-02 NOTE — Progress Notes (Signed)
Primary Care Physician: Josetta Huddle, MD Referring Physician: [facility] f/u EP:Dr. Allred Cardiologist: Dr.Turner   Jenna Mcdonald is a 72 y.o. female with a h/o persistent afib that was recently hospitalized 11/26 for failure of flecainide to maintain  SR. She was started on amiodarone. Dr. Rayann Heman consulted and thought pt may be an ablation candidate.  She is on warfarin.  In the afib clinic today she is in afib with RVR. She thinks for the majority of last week she may have been in SR. She is very symptomatic with fatigue and lightheadedness in afib. She is wanting to pursue an ablation as soon as possible  Today, she denies symptoms of palpitations, chest pain, shortness of breath, orthopnea, PND, lower extremity edema, dizziness, presyncope, syncope, or neurologic sequela. The patient is tolerating medications without difficulties and is otherwise without complaint today.   Past Medical History:  Diagnosis Date  . Anemia   . Aortic stenosis    mild by echo 08/2016, not mentioned in 06/2018 TEE  . Breast CA ([place])    left  . Carotid stenosis    1-39% left  . Degenerative disc disease, lumbar    of the knees  . Diabetes mellitus   . 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[place])   . Fatigue    Severe secondary to to gemfibrozil  . Gastroesophageal reflux disease    denies  . GI bleeding    a. 06/2018: EGD showed multiple nonbleeding duodenal ulcers. 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  . Hammertoe    left second toe  . Hypercholesteremia   . Hypertension   . Insomnia   . Irritable bowel   . Low back pain   . Metabolic syndrome   . Mild mitral regurgitation 06/2018  . Morbid obesity ([place])   . OSA on CPAP \   bipap  . Osteoarthritis   . Osteopenia   . Persistent atrial fibrillation   . Personal history of radiation therapy   . Pneumonia   . PONV (postoperative nausea and vomiting)   . Sigmoid diverticulosis   .  Thalassemia minor   . Thalassemia minor   . Vitamin D deficiency    Past Surgical History:  Procedure Laterality Date  . BIOPSY  07/06/2018   Procedure: BIOPSY;  Surgeon: Clarene Essex, MD;  Location: [place];  Service: Endoscopy;;  . BREAST LUMPECTOMY Left 05/28/2009  . BREAST SURGERY Left 10   lumpectomy  . CARDIOVERSION N/A 07/07/2018   Procedure: CARDIOVERSION;  Surgeon: [name], MD;  Location: [facility] ENDOSCOPY;  Service: Cardiovascular;  Laterality: N/A;  . CARDIOVERSION N/A 07/14/2018   Procedure: CARDIOVERSION;  Surgeon: Larey Dresser, MD;  Location: [facility] ENDOSCOPY;  Service: Cardiovascular;  Laterality: N/A;  . CESAREAN SECTION    . COLONOSCOPY WITH PROPOFOL N/A 07/06/2018   Procedure: COLONOSCOPY WITH PROPOFOL;  Surgeon: Clarene Essex, MD;  Location: [place];  Service: Endoscopy;  Laterality: N/A;  . CYSTECTOMY  70   pilonidal   . DILATION AND CURETTAGE OF UTERUS    . ESOPHAGOGASTRODUODENOSCOPY (EGD) WITH PROPOFOL N/A 07/06/2018   Procedure: ESOPHAGOGASTRODUODENOSCOPY (EGD) WITH PROPOFOL;  Surgeon: Clarene Essex, MD;  Location: [place];  Service: Endoscopy;  Laterality: N/A;  . EYE SURGERY Bilateral 12/14   cataracts  . HOT HEMOSTASIS N/A 07/06/2018   Procedure: HOT HEMOSTASIS (ARGON PLASMA COAGULATION/BICAP);  Surgeon: Clarene Essex, MD;  Location: [place];  Service: Endoscopy;  Laterality: N/A;  . MAXIMUM ACCESS (MAS)POSTERIOR LUMBAR INTERBODY FUSION (  PLIF) 2 LEVEL N/A 09/22/2013   Procedure: FOR MAXIMUM ACCESS  POSTERIOR LUMBAR INTERBODY FUSION LUMBAR THREE-FOUR FOUR-FIVE;  Surgeon: Eustace Moore, MD;  Location: [place] NEURO ORS;  Service: Neurosurgery;  Laterality: N/A;  . OOPHORECTOMY     wedge section not rem  . 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 REPLACEMENT TOTAL KNEE    . TEE WITHOUT CARDIOVERSION N/A 07/07/2018   Procedure: TRANSESOPHAGEAL ECHOCARDIOGRAM (TEE);  Surgeon: [name], MD;  Location: [place];  Service: Cardiovascular;   Laterality: N/A;  . TONSILLECTOMY    . TUBAL LIGATION      Current Outpatient Medications  Medication Sig Dispense Refill  . acetaminophen (TYLENOL) 500 MG tablet Take 500 mg by mouth every 6 (six) hours as needed for mild pain. For back pain    . amiodarone (PACERONE) 200 MG tablet Take 1 tablet (200 mg total) by mouth 2 (two) times daily. 60 tablet 0  . Biotin 10 MG TABS Take 10 mg by mouth 2 (two) times daily.    . Calcium Carbonate-Vitamin D (CALCIUM 600 + D PO) Take 1 tablet by mouth daily.     Marland Kitchen diltiazem (CARDIZEM CD) 240 MG 24 hr capsule Take 1 capsule (240 mg total) by mouth daily. 30 capsule 0  . glimepiride (AMARYL) 4 MG tablet Take 2 mg by mouth daily.   3  . metFORMIN (GLUCOPHAGE-XR) 500 MG 24 hr tablet Take 1 tablet by mouth 2 (two) times daily.  3  . Multiple Vitamin (MULITIVITAMIN WITH MINERALS) TABS Take 1 tablet by mouth daily.    . pantoprazole (PROTONIX) 40 MG tablet Take 1 tablet (40 mg total) by mouth daily. 90 tablet 3  . warfarin (COUMADIN) 4 MG tablet Take 1 tablet (4 mg total) by mouth daily at 6 PM. (5 mg  Thursday. 4 mg all the other days) 30 tablet 0   No current facility-administered medications for this encounter.     Allergies  Allergen Reactions  . Aromasin [Exemestane]     Hair loss   . Femara [Letrozole]     unknown  . Gemfibrozil Other (See Comments)    Other reaction(s): Other (See Comments) Unknown Severe fatigue  Other reaction(s): Other (See Comments) Unknown Severe fatigue Unknown  . Hydrocodone-Acetaminophen Nausea And Vomiting    Patient reports terrible nausea; plain tylenol is ok with patient  . Letrozole Other (See Comments)    Unknown reactions   . Metoprolol Diarrhea    Fatigue   . Other Other (See Comments)    Other reaction(s): Other (See Comments) Numerous cancer drugs - unknown reaction Other reaction(s): Other (See Comments) Numerous cancer drugs - unknown reaction Numerous cancer drugs - unknown reaction  .  Tamoxifen     Bone and body aches   . Tetracycline Other (See Comments)    Other reaction(s): Other (See Comments) Unknown reaction Other reaction(s): Other (See Comments) Unknown reaction Unknown reaction  . Tetracyclines & Related Other (See Comments)    Vaginal infections    . Toprol Xl [Metoprolol Tartrate]     Hair loss  . Verapamil Other (See Comments)    Other reaction(s): Other (See Comments) Unknown reaction Other reaction(s): Other (See Comments) Unknown reaction Unknown reaction  . Vicodin [Hydrocodone-Acetaminophen] Nausea And Vomiting    Patient reports terrible nausea; plain tylenol is ok with patient    Social History   Socioeconomic History  . Marital status: Single    Spouse name: Not on file  . Number of children: Not  on file  . Years of education: Not on file  . Highest education level: Not on file  Occupational History  . Not on file  Social Needs  . Financial resource strain: Not on file  . Food insecurity:    Worry: Not on file    Inability: Not on file  . Transportation needs:    Medical: Not on file    Non-medical: Not on file  Tobacco Use  . Smoking status: Former Smoker    Packs/day: 0.50    Years: 10.00    Pack years: 5.00    Types: Cigarettes    Last attempt to quit: 11/12/2003    Years since quitting: 14.7  . Smokeless tobacco: Never Used  Substance and Sexual Activity  . Alcohol use: Yes    Comment: occasionally  . Drug use: No  . Sexual activity: Never    Birth control/protection: Post-menopausal  Lifestyle  . Physical activity:    Days per week: Not on file    Minutes per session: Not on file  . Stress: Not on file  Relationships  . Social connections:    Talks on phone: Not on file    Gets together: Not on file    Attends religious service: Not on file    Active member of club or organization: Not on file    Attends meetings of clubs or organizations: Not on file    Relationship status: Not on file  . Intimate  partner violence:    Fear of current or ex partner: Not on file    Emotionally abused: Not on file    Physically abused: Not on file    Forced sexual activity: Not on file  Other Topics Concern  . Not on file  Social History Narrative  . Not on file    Family History  Problem Relation Age of Onset  . Anesthesia problems Mother   . Diabetes Mother   . Diabetes Father     ROS- All systems are reviewed and negative except as per the HPI above  Physical Exam: Vitals:   08/02/18 1024  BP: (!) 136/58  Pulse: (!) 136  SpO2: 97%  Weight: 111.1 kg  Height: 5\' 5"  (1.651 m)   Wt Readings from Last 3 Encounters:  08/02/18 111.1 kg  07/23/18 109.1 kg  07/16/18 109 kg    Labs: Lab Results  Component Value Date   NA 138 07/23/2018   K 4.3 07/23/2018   CL 109 07/23/2018   CO2 21 (L) 07/23/2018   GLUCOSE 154 (H) 07/23/2018   BUN 19 07/23/2018   CREATININE 1.26 (H) 07/23/2018   CALCIUM 8.7 (L) 07/23/2018   MG 1.8 07/21/2018   Lab Results  Component Value Date   INR 2.63 07/23/2018   Lab Results  Component Value Date   CHOL  05/14/2008    146        ATP III CLASSIFICATION:  <200     mg/dL   Desirable  200-239  mg/dL   Borderline High  >=240    mg/dL   High   HDL 27 (L) 05/14/2008   LDLCALC  05/14/2008    98        Total Cholesterol/HDL:CHD Risk Coronary Heart Disease Risk Table                     Men   Women  1/2 Average Risk   3.4   3.3   TRIG 105 05/14/2008  GEN- The patient is well appearing, alert and oriented x 3 today.   Head- normocephalic, atraumatic Eyes-  Sclera clear, conjunctiva pink Ears- hearing intact Oropharynx- clear Neck- supple, no JVP Lymph- no cervical lymphadenopathy Lungs- Clear to ausculation bilaterally, normal work of breathing Heart- irregular rate and rhythm, no murmurs, rubs or gallops, PMI not laterally displaced GI- soft, NT, ND, + BS Extremities- no clubbing, cyanosis, or edema MS- no significant deformity or  atrophy Skin- no rash or lesion Psych- euthymic mood, full affect Neuro- strength and sensation are intact  EKG- afib at 136 ms, qrs int 82 ms, qtc 496 ms Echo-Study Conclusions  - Left ventricle: The cavity size was normal. Wall thickness was   increased in a pattern of moderate LVH. Systolic function was   normal. The estimated ejection fraction was in the range of 60%   to 65%. Wall motion was normal; there were no regional wall   motion abnormalities. Doppler parameters are consistent with   abnormal left ventricular relaxation (grade 1 diastolic   dysfunction). The E/e&' ratio is between 8-15, suggesting   indeterminate LV filling pressure, - Aortic valve: Mildly calcified leaflets. Mild stenosis, Mean   gradient (S): 11 mm Hg. Valve area (VTI): 1.85 cm^2. Valve area   (Vmean): 1.86 cm^2. - Mitral valve: Calcified annulus. Mildly thickened leaflets .   There was trivial regurgitation. - Left atrium: Moderately dilated. 40 mm, volume 81.1 - Right atrium: The atrium was mildly dilated. - Atrial septum: No defect or patent foramen ovale was identified. - Tricuspid valve: There was trivial regurgitation. - Pulmonary arteries: PA peak pressure: 25 mm Hg (S). - Inferior vena cava: The IVC is >2.1 cm, but does not collapse   >50%, suggesting an elevated RA pressure of 8 mmHg.  Impressions:  - Compared to a recent study, the LVEF is higher at 60-65%. There   is mild aortic stenosis.    Assessment and Plan: 1. Persistent afib Currently in afib with RVR although she states last week in SR by her portable ekg monitor, EMAY  Pt very symptomatic and wanting to  pursue ablation  Continue amiodarone at 200 mg bid  Continue diltiazem 240 mg daily Contiue warfarin for a CHA2DS2VASc score of 4  I will refer to Dr. Rayann Heman to further discuss ablation  Butch Penny C. Airrion [place], [facility] [address] [place], [place] [address] [phone]

## 2018-08-06 ENCOUNTER — Telehealth (HOSPITAL_COMMUNITY): Payer: Self-pay | Admitting: *Deleted

## 2018-08-06 ENCOUNTER — Ambulatory Visit (INDEPENDENT_AMBULATORY_CARE_PROVIDER_SITE_OTHER): Payer: Medicare Other | Admitting: Physician Assistant

## 2018-08-06 ENCOUNTER — Encounter: Payer: Self-pay | Admitting: Internal Medicine

## 2018-08-06 VITALS — BP 132/58 | HR 124 | Ht 65.0 in | Wt 244.8 lb

## 2018-08-06 DIAGNOSIS — I4891 Unspecified atrial fibrillation: Secondary | ICD-10-CM | POA: Diagnosis not present

## 2018-08-06 DIAGNOSIS — I350 Nonrheumatic aortic (valve) stenosis: Secondary | ICD-10-CM | POA: Diagnosis not present

## 2018-08-06 DIAGNOSIS — I10 Essential (primary) hypertension: Secondary | ICD-10-CM

## 2018-08-06 MED ORDER — DILTIAZEM HCL ER COATED BEADS 240 MG PO CP24: 240.0000 mg | ORAL_CAPSULE | Freq: Two times a day (BID) | ORAL | 3 refills | Status: DC

## 2018-08-06 MED ORDER — DILTIAZEM HCL ER COATED BEADS 240 MG PO CP24: 240.0000 mg | ORAL_CAPSULE | Freq: Two times a day (BID) | ORAL | 11 refills | Status: DC

## 2018-08-06 NOTE — Progress Notes (Addendum)
Cardiology Office Note:    Date:  08/06/2018   ID:  Jenna Mcdonald, DOB 03/10/1946, MRN [id number]  PCP:  Josetta Huddle, MD  Cardiologist:  Fransico Him, MD  Electrophysiologist:  Thompson Grayer, MD   Referring MD: Josetta Huddle, MD   Chief Complaint  Patient presents with  . Atrial Fibrillation    History of Present Illness:    Jenna Mcdonald is a 72 y.o. female with persistent atrial fibrillation, sleep apnea on BiPAP, hypertension, diabetes, thalassemia minor, empty sella syndrome, breast cancer, morbid obesity, recent GI bleeding in November 2019 with associated anemia, chronic kidney disease.  She is on long-term anticoagulation with warfarin, which is managed by primary care.    She was previously controlled with as needed Rythmol.  However, over the last several weeks, she has had refractory atrial fibrillation.  She was admitted in November 2019 with atrial fibrillation with rapid ventricular rate.  Hemoccult was positive when she was noted to be anemic.  EGD demonstrated multiple nonbleeding duodenal ulcers and colonoscopy demonstrated multiple colonic angiodysplastic lesions treated with argon plasma.  She ultimately underwent TEE guided DC cardioversion.  She was readmitted again in November 2019 with recurrent atrial fibrillation.  She was placed on flecainide and cardioverted back to normal sinus rhythm.  She was seen in clinic in follow-up November 22.  She was short of breath and hypoxic and was sent to the emergency room.  CT was negative for pulmonary embolism and her beta-blocker was discontinued.  She was readmitted again in late November with recurrent atrial fibrillation.  Her flecainide was stopped and she was placed on amiodarone and converted back to NSR.  She was seen by Dr. Rayann Heman in the hospital.  She followed up in the [facility] on 08/02/2018.  She was back in atrial fibrillation with rapid ventricular rate.  She has an appointment pending  08/09/2018 with Dr. Rayann Heman to discuss PVI ablation.  She called the AFib Clinic this morning with worsening symptoms and elevated HR.    Jenna Mcdonald returns for evaluation of recurrent atrial fibrillation with rapid rate.  She is here alone.  She knows that she was back in atrial fibrillation last weekend.  Over the last few days, she feels that her symptoms have gotten worse.  She notes chest pressure with rapid rate.  This typically improves when her rhythm returns to normal.  She does feel somewhat short of breath.  She denies syncope or dizziness.  She sleeps with CPAP but has not been able to sleep recently.  She denies orthopnea or significant lower extremity swelling.  She denies any bleeding issues.  She has follow-up with gastroenterology next month.  She saw her primary care physician 2 days ago.  She was told her INR was okay.  Prior CV studies:   The following studies were reviewed today:  Echo 07/23/2018 Moderate LVH, EF 60-65, normal wall motion, grade 1 diastolic dysfunction, mild aortic stenosis (mean 11), MAC, trivial MR, moderate LAE, mild RAE, trivial TR  Chest CTA 07/16/18 IMPRESSION: No definite evidence of pulmonary embolus. No acute abnormality seen in the chest. Aortic Atherosclerosis (ICD10-I70.0).  Carotid US 10/05/2017 L 1-39  48-hour Holter 09/27/2015  Normal sinus rhythm and sinus tachycardia with average heart rate 85bpm. The heart rate ranged from 65-132bpm.  Occasional PVCs  Frequent PACs and nonsutained atrial tachycardia up to 10 beats.  Nuclear stress test 09/27/2015 Normal stress nuclear study with no ischemia or infarction; EF 76 with normal wall  motion.   Past Medical History:  Diagnosis Date  . Anemia   . Aortic stenosis    mild by echo 08/2016, not mentioned in 06/2018 TEE  . Breast CA ([place])    left  . Carotid stenosis    1-39% left  . Degenerative disc disease, lumbar    of the knees  . Diabetes mellitus   . 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[place])   .  Fatigue    Severe secondary to to gemfibrozil  . Gastroesophageal reflux disease    denies  . GI bleeding    a. 06/2018: EGD showed multiple nonbleeding duodenal ulcers. 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  . Hammertoe    left second toe  . Hypercholesteremia   . Hypertension   . Insomnia   . Irritable bowel   . Low back pain   . Metabolic syndrome   . Mild mitral regurgitation 06/2018  . Morbid obesity ([place])   . OSA on CPAP \   bipap  . Osteoarthritis   . Osteopenia   . Persistent atrial fibrillation   . Personal history of radiation therapy   . Pneumonia   . PONV (postoperative nausea and vomiting)   . Sigmoid diverticulosis   . Thalassemia minor   . Thalassemia minor   . Vitamin D deficiency    Surgical Hx: The patient  has a past surgical history that includes Replacement total knee (Left, 11); Tonsillectomy; Dilation and curettage of uterus; Replacement total knee; Tubal ligation; Cystectomy (70); Oophorectomy; Breast surgery (Left, 10); Eye surgery (Bilateral, 12/14); Cesarean section; Maximum access (mas)posterior lumbar interbody fusion (plif) 2 level (N/A, 09/22/2013); Breast lumpectomy (Left, 05/28/2009); Esophagogastroduodenoscopy (egd) with propofol (N/A, 07/06/2018); Colonoscopy with propofol (N/A, 07/06/2018); biopsy (07/06/2018); Hot hemostasis (N/A, 07/06/2018); TEE without cardioversion (N/A, 07/07/2018); Cardioversion (N/A, 07/07/2018); and Cardioversion (N/A, 07/14/2018).   Current Medications: Current Meds  Medication Sig  . acetaminophen (TYLENOL) 500 MG tablet Take 500 mg by mouth every 6 (six) hours as needed for mild pain. For back pain  . amiodarone (PACERONE) 200 MG tablet Take 1 tablet (200 mg total) by mouth 2 (two) times daily.  . Biotin 10 MG TABS Take 10 mg by mouth 2 (two) times daily.  . Calcium Carbonate-Vitamin D (CALCIUM 600 + D PO) Take 1 tablet by mouth daily.   Marland Kitchen  glimepiride (AMARYL) 4 MG tablet Take 2 mg by mouth daily.   . metFORMIN (GLUCOPHAGE-XR) 500 MG 24 hr tablet Take 1 tablet by mouth 2 (two) times daily.  . Multiple Vitamin (MULITIVITAMIN WITH MINERALS) TABS Take 1 tablet by mouth daily.  . pantoprazole (PROTONIX) 40 MG tablet Take 1 tablet (40 mg total) by mouth daily.  Marland Kitchen warfarin (COUMADIN) 4 MG tablet Take 1 tablet (4 mg total) by mouth daily at 6 PM. (5 mg  Thursday. 4 mg all the other days)  . [DISCONTINUED] diltiazem (CARDIZEM CD) 240 MG 24 hr capsule Take 1 capsule (240 mg total) by mouth daily.     Allergies:   Femara [letrozole]; Gemfibrozil; Hydrocodone-acetaminophen; Letrozole; Metoprolol; Other; Tamoxifen; Tetracycline; Tetracyclines & related; Toprol xl [metoprolol tartrate]; Verapamil; Vicodin [hydrocodone-acetaminophen]; and Aromasin [exemestane]   Social History   Tobacco Use  . Smoking status: Former Smoker    Packs/day: 0.50    Years: 10.00    Pack years: 5.00    Types: Cigarettes    Last attempt to quit: 11/12/2003    Years since quitting: 14.7  . Smokeless tobacco: Never Used  Substance Use Topics  . Alcohol use: Yes    Comment: occasionally  . Drug use: No     Family Hx: The patient's family history includes Anesthesia problems in her mother; Diabetes in her father and mother.  ROS:   Please see the history of present illness.    Review of Systems  Constitution: Positive for malaise/fatigue.  HENT: Positive for hearing loss.   Cardiovascular: Positive for chest pain, irregular heartbeat and palpitations.  Respiratory: Positive for cough and shortness of breath.    All other systems reviewed and are negative.   EKGs/Labs/Other Test Reviewed:    EKG:  EKG is  ordered today.  The ekg ordered today demonstrates atrial fibrillation, heart rate 124, normal axis, T wave inversions 2, 3, aVF, V4-V6, QTC 459, similar to old tracings  Recent Labs: 07/12/2018: ALT 55 07/16/2018: B Natriuretic Peptide  108.0 07/21/2018: Magnesium 1.8; TSH 3.295 07/23/2018: BUN 19; Creatinine, Ser 1.26; Hemoglobin 8.9; Platelets 148; Potassium 4.3; Sodium 138   Recent Lipid Panel Lab Results  Component Value Date/Time   CHOL  05/14/2008 04:35 AM    146        ATP III CLASSIFICATION:  <200     mg/dL   Desirable  200-239  mg/dL   Borderline High  >=240    mg/dL   High   TRIG 105 05/14/2008 04:35 AM   HDL 27 (L) 05/14/2008 04:35 AM   CHOLHDL 5.4 05/14/2008 04:35 AM   LDLCALC  05/14/2008 04:35 AM    98        Total Cholesterol/HDL:CHD Risk Coronary Heart Disease Risk Table                     Men   Women  1/2 Average Risk   3.4   3.3    Physical Exam:    VS:  BP (!) 132/58   Pulse (!) 124   Ht _0  (1.651 m)   Wt 244 lb 12.8 oz (111 kg)   SpO2 96%   BMI 40.74 kg/m     Wt Readings from Last 3 Encounters:  08/06/18 244 lb 12.8 oz (111 kg)  08/02/18 245 lb (111.1 kg)  07/23/18 240 lb 9.6 oz (109.1 kg)     Physical Exam  Constitutional: She is oriented to person, place, and time. She appears well-developed and well-nourished. No distress.  HENT:  Head: Normocephalic and atraumatic.  Eyes: No scleral icterus.  Neck: No JVD present. No thyromegaly present.  Cardiovascular: An irregularly irregular rhythm present. Tachycardia present.  Pulmonary/Chest: Effort normal. She has no rales.  Abdominal: Soft.  Musculoskeletal:        General: Edema (trace bilat LE edema) present.  Lymphadenopathy:    She has no cervical adenopathy.  Neurological: She is alert and oriented to person, place, and time.  Skin: Skin is warm and dry.  Psychiatric: She has a normal mood and affect.    ASSESSMENT & PLAN:    Atrial fibrillation with RVR ([place]) She remains in atrial fibrillation with rapid rate.  She is quite symptomatic.  She is on chronic anticoagulation with warfarin.  This is managed by primary care.  She apparently had a therapeutic INR earlier this week.  She has had 5600 mg of amiodarone  since she started this drug.  I reviewed her case today with Dr. Curt Bears (attending MD).  We discussed the possibility of proceeding with cardioversion today versus increasing her rate controlling medication.  He felt that  increasing her rate controlling medication was a better option.  She has an appointment with Dr. Rayann Heman on Monday.  -Increase diltiazem to 240 mg every 12 hours  -Continue amiodarone 200 mg twice daily   -Continue warfarin   -Keep appointment with Dr. Rayann Heman 08/09/2018  -Call/go to ED if symptoms worsen over the weekend  -If she worsens over the weekend, we could consider emergent cardioversion  Essential hypertension  Borderline control.  Adjust diltiazem as noted.  Nonrheumatic aortic valve stenosis Mild by most recent echocardiogram.  Dispo:  Return in 3 days (on 08/09/2018) for Scheduled Follow Up w/ Dr. Rayann Heman.   Medication Adjustments/Labs and Tests Ordered: Current medicines are reviewed at length with the patient today.  Concerns regarding medicines are outlined above.  Tests Ordered: Orders Placed This Encounter  Procedures  . EKG 12-Lead   Medication Changes: Meds ordered this encounter  Medications  . DISCONTD: diltiazem (CARDIZEM CD) 240 MG 24 hr capsule    Sig: Take 1 capsule (240 mg total) by mouth every 12 (twelve) hours.    Dispense:  180 capsule    Refill:  3  . diltiazem (CARDIZEM CD) 240 MG 24 hr capsule    Sig: Take 1 capsule (240 mg total) by mouth every 12 (twelve) hours.    Dispense:  60 capsule    Refill:  [address], [place] Do[place], [place]  08/06/2018 [facility] [place], [place], [place]  [address] Phone: [phone]; Fax: [phone]

## 2018-08-06 NOTE — Telephone Encounter (Signed)
Patient called in this morning very tearful and anxious stating she has not slept in 2 days due to her HR being in the 140s and shortness of breath. She denies chest pain but states fluttering in her chest is noted. When asked if she felt worse than Monday when Roderic Palau NP saw her she stated she felt alittle more short of breath. BP is 158/86 HR 122 upon recheck while on the phone. Pt does not weigh herself daily so she is unsure if her weight is up. Educated on importance of daily weight log for fluid monitoring.  Discussed with Marinus Maw PA as provider in AF clinic is out today - recommends since patient is symptomatic with elevated rates that she be assessed. Called and made an appt with Richardson Dopp PA this morning - pt in agreement. Pt does have an appointment with Dr. Rayann Heman on Monday to discuss ablation.

## 2018-08-06 NOTE — Patient Instructions (Addendum)
Medication Instructions:  Your physician has recommended you make the following change in your medication: 1. INCREASE DILTIAZEM TO 240 MG EVERY 12 HOURS.  If you need a refill on your cardiac medications before your next appointment, please call your pharmacy.   Lab work: NONE If you have labs (blood work) drawn today and your tests are completely normal, you will receive your results only by: Marland Kitchen MyChart Message (if you have MyChart) OR . A paper copy in the mail If you have any lab test that is abnormal or we need to change your treatment, we will call you to review the results.  Testing/Procedures: NONE  Follow-Up: At [facility], you and your health needs are our priority.  As part of our continuing mission to provide you with exceptional heart care, we have created designated Provider Care Teams.  These Care Teams include your primary Cardiologist (physician) and Advanced Practice Providers (APPs -  Physician Assistants and Nurse Practitioners) who all work together to provide you with the care you need, when you need it. Marland Kitchen KEEP APPOINTMENT WITH DR. ALLRED 12/16 @ 11 AM  Any Other Special Instructions Will Be Listed Below (If Applicable).  IF YOU FEEL WORST PLEASE CALL ON CALL PROVIDER OR GO TO EMERGENCY ROOM.

## 2018-08-09 ENCOUNTER — Encounter: Payer: Self-pay | Admitting: Internal Medicine

## 2018-08-09 ENCOUNTER — Ambulatory Visit (INDEPENDENT_AMBULATORY_CARE_PROVIDER_SITE_OTHER): Payer: Medicare Other | Admitting: Internal Medicine

## 2018-08-09 VITALS — BP 126/60 | HR 74 | Ht 65.0 in | Wt 248.0 lb

## 2018-08-09 DIAGNOSIS — I4819 Other persistent atrial fibrillation: Secondary | ICD-10-CM

## 2018-08-09 DIAGNOSIS — R079 Chest pain, unspecified: Secondary | ICD-10-CM | POA: Diagnosis not present

## 2018-08-09 DIAGNOSIS — G4733 Obstructive sleep apnea (adult) (pediatric): Secondary | ICD-10-CM | POA: Diagnosis not present

## 2018-08-09 DIAGNOSIS — R0602 Shortness of breath: Secondary | ICD-10-CM

## 2018-08-09 DIAGNOSIS — I10 Essential (primary) hypertension: Secondary | ICD-10-CM

## 2018-08-09 MED ORDER — METOPROLOL TARTRATE 100 MG PO TABS: 100.0000 mg | ORAL_TABLET | Freq: Once | ORAL | 0 refills | Status: DC

## 2018-08-09 MED ORDER — GUAIFENESIN ER 600 MG PO TB12: 600.0000 mg | ORAL_TABLET | Freq: Two times a day (BID) | ORAL | 1 refills | Status: DC | PRN

## 2018-08-09 MED ORDER — AMIODARONE HCL 200 MG PO TABS: 200.0000 mg | ORAL_TABLET | Freq: Every day | ORAL | 3 refills | Status: DC

## 2018-08-09 NOTE — Patient Instructions (Addendum)
Medication Instructions:  Your physician has recommended you make the following change in your medication:   1.  Reduce your amiodarone 200 mg--Take one tablet by mouth daily  2.  Mucinex 600 mg--One tablet by mouth twice a day as needed  Labwork:  You are scheduled for August 17, 2018 at 10:30 am to establish with our coumadin clinic.  Testing/Procedures: Your physician has requested that you have a TEE. During a TEE, sound waves are used to create images of your heart. It provides your doctor with information about the size and shape of your heart and how well your heart's chambers and valves are working. In this test, a transducer is attached to the end of a flexible tube that's guided down your throat and into your esophagus (the tube leading from you mouth to your stomach) to get a more detailed image of your heart. You are not awake for the procedure. Please see the instruction sheet given to you today. For further information please visit HugeFiesta.[email or web address].   Your physician has requested that you have cardiac CT. Cardiac computed tomography (CT) is a painless test that uses an x-ray machine to take clear, detailed pictures of your heart. For further information please visit [email or web address].tn. Please follow instruction sheet as given.  Your physician has recommended that you have an ablation. Catheter ablation is a medical procedure used to treat some cardiac arrhythmias (irregular heartbeats). During catheter ablation, a long, thin, flexible tube is put into a blood vessel in your groin (upper thigh), or neck. This tube is called an ablation catheter. It is then guided to your heart through the blood vessel. Radio frequency waves destroy small areas of heart tissue where abnormal heartbeats may cause an arrhythmia to start. Please see the instruction sheet given to you today.  Follow-Up:  You will follow up with Roderic Palau, NP with the Atrial fibrillation (Afib) clinic 4 weeks  after your ablation.  You will follow up with Dr. Rayann Heman 3 months after your procedure.    CARDIAC CT INSTRUCTIONS:   Please arrive at the [facility] main entrance of [facility] at xx:xx AM (30-45 minutes prior to test start time)  [facility] [place], [place] [address] [phone]  Proceed to the [facility] Radiology Department (First Floor).  Please follow these instructions carefully (unless otherwise directed):  On the Night Before the Test: . Be sure to Drink plenty of water. . Do not consume any caffeinated/decaffeinated beverages or chocolate 12 hours prior to your test. . Do not take any antihistamines 12 hours prior to your test. . If you take Metformin do not take 24 hours prior to test.  On the Day of the Test: . Drink plenty of water. Do not drink any water within one hour of the test. . Do not eat any food 4 hours prior to the test. . You may take your regular medications prior to the test.         -Take metoprolol (Lopressor) 2 hours prior to test (if applicable).                  -If HR is less than 55 BPM- No Beta Blocker                -IF HR is greater than 55 BPM and patient is less than or equal to 45 yrs old Lopressor 100mg  x1.  After the Test: . Drink plenty of water. . After receiving IV contrast, you may experience a mild flushed feeling. This is normal. . On occasion, you may experience a mild rash up to 24 hours after the test. This is not dangerous. If this occurs, you can take Benadryl 25 mg and increase your fluid intake. . If you experience trouble breathing, this can be serious. If it is severe call 911 IMMEDIATELY. If it is mild, please call our office. . If you take any of these medications: Glipizide/Metformin, Avandament, Glucavance, please do not take 48 hours after completing test.     TEE INSTRUCTIONS:  You are scheduled for a TEE on September 20, 2018 with Dr. Sallyanne Kuster.   Please arrive to ADMITTING at the [facility] (Main Entrance A) at [facility]: [address] [place], [place] [address] at 9:00 am. (1 hour prior to procedure unless lab work is needed; if lab work is needed arrive 1.5 hours ahead)  DIET: Nothing to eat or drink after midnight except a sip of water with medications (see medication instructions below)  Medication Instructions: Take all your normal morning medications with a sip of water EXCEPT: glimepiride and metformin  Labs:  Will be drawn prior to your appointment  You must have a responsible person to drive you home and stay in the waiting area during your procedure. Failure to do so could result in cancellation.  Bring your insurance cards.  *Special Note: Every effort is made to have your procedure done on time. Occasionally there are emergencies that occur at the hospital that may cause delays. Please be patient if a delay does occur.    ABLATION INSTRUCTIONS:  Please arrive at to ADMITTING down the hall from the [facility] main entrance of [facility] hospital at:  7:30 am on September 21, 2018  Do not eat or drink after midnight prior to procedure  Do not take any medications the morning of the procedure  Plan for one night stay     Cardiac Ablation Cardiac ablation is a procedure to disable (ablate) a small amount of heart tissue in very specific places. The heart has many electrical connections. Sometimes these connections are abnormal and can cause the heart to beat very fast or irregularly. Ablating some of the problem areas can improve the heart rhythm or return it to normal. Ablation may be done for people who:  Have Wolff-Parkinson-White syndrome.  Have fast heart rhythms (tachycardia).  Have taken medicines for an abnormal heart rhythm (arrhythmia) that were not effective or caused side effects.  Have a high-risk heartbeat that may be life-threatening.  During the procedure, a small incision is made in  the neck or the groin, and a long, thin, flexible tube (catheter) is inserted into the incision and moved to the heart. Small devices (electrodes) on the tip of the catheter will send out electrical currents. A type of X-ray (fluoroscopy) will be used to help guide the catheter and to provide images of the heart. Tell a health care provider about:  Any allergies you have.  All medicines you are taking, including vitamins, herbs, eye drops, creams, and over-the-counter medicines.  Any problems you or family members have had with anesthetic medicines.  Any blood disorders you have.  Any surgeries you have had.  Any medical conditions you have, such as kidney failure.  Whether you are pregnant or may be pregnant. What are the risks? Generally, this is a safe procedure. However, problems may occur,  including:  Infection.  Bruising and bleeding at the catheter insertion site.  Bleeding into the chest, especially into the sac that surrounds the heart. This is a serious complication.  Stroke or blood clots.  Damage to other structures or organs.  Allergic reaction to medicines or dyes.  Need for a permanent pacemaker if the normal electrical system is damaged. A pacemaker is a small computer that sends electrical signals to the heart and helps your heart beat normally.  The procedure not being fully effective. This may not be recognized until months later. Repeat ablation procedures are sometimes required.  What happens before the procedure?  Follow instructions from your health care provider about eating or drinking restrictions.  Ask your health care provider about: ? Changing or stopping your regular medicines. This is especially important if you are taking diabetes medicines or blood thinners. ? Taking medicines such as aspirin and ibuprofen. These medicines can thin your blood. Do not take these medicines before your procedure if your health care provider instructs you not  to.  Plan to have someone take you home from the hospital or clinic.  If you will be going home right after the procedure, plan to have someone with you for 24 hours. What happens during the procedure?  To lower your risk of infection: ? Your health care team will wash or sanitize their hands. ? Your skin will be washed with soap. ? Hair may be removed from the incision area.  An IV tube will be inserted into one of your veins.  You will be given a medicine to help you relax (sedative).  The skin on your neck or groin will be numbed.  An incision will be made in your neck or your groin.  A needle will be inserted through the incision and into a large vein in your neck or groin.  A catheter will be inserted into the needle and moved to your heart.  Dye may be injected through the catheter to help your surgeon see the area of the heart that needs treatment.  Electrical currents will be sent from the catheter to ablate heart tissue in desired areas. There are three types of energy that may be used to ablate heart tissue: ? Heat (radiofrequency energy). ? Laser energy. ? Extreme cold (cryoablation).  When the necessary tissue has been ablated, the catheter will be removed.  Pressure will be held on the catheter insertion area to prevent excessive bleeding.  A bandage (dressing) will be placed over the catheter insertion area. The procedure may vary among health care providers and hospitals. What happens after the procedure?  Your blood pressure, heart rate, breathing rate, and blood oxygen level will be monitored until the medicines you were given have worn off.  Your catheter insertion area will be monitored for bleeding. You will need to lie still for a few hours to ensure that you do not bleed from the catheter insertion area.  Do not drive for 24 hours or as long as directed by your health care provider. Summary  Cardiac ablation is a procedure to disable (ablate) a  small amount of heart tissue in very specific places. Ablating some of the problem areas can improve the heart rhythm or return it to normal.  During the procedure, electrical currents will be sent from the catheter to ablate heart tissue in desired areas. This information is not intended to replace advice given to you by your health care provider. Make sure you discuss any questions  you have with your health care provider. Document Released: 12/28/2008 Document Revised: 06/30/2016 Document Reviewed: 06/30/2016 Elsevier Interactive Patient Education  2018 [place] will need someone to drive you home at discharge

## 2018-08-09 NOTE — Progress Notes (Signed)
Electrophysiology Office Note   Date:  08/09/2018   ID:  Jenna Mcdonald, DOB June 26, 1946, MRN [id number]  PCP:  Josetta Huddle, MD  Cardiologist:  Dr Radford Pax Primary Electrophysiologist: Thompson Grayer, MD    CC: afib   History of Present Illness: Jenna Mcdonald is a 72 y.o. female who presents today for electrophysiology evaluation.   She is referred by Dr Radford Pax and Roderic Palau for consideration of ablation.  I did see her during her recent hospitalization 07/21/18 (My note reviewed today). She was taken off of flecainide at that time.  She was started on amiodarone.  She has had ongoing afib but has since converted to sinus rhythm.  She was unaware that she was in sinus today.  Still having exertional SOB and chest discomfort.  She had cardiac CT planned in the hospital and ordered but has not had this performed yet.  Today, she denies symptoms of palpitations, orthopnea, PND, lower extremity edema, claudication, dizziness, presyncope, syncope, bleeding, or neurologic sequela. The patient is tolerating medications without difficulties and is otherwise without complaint today.    Past Medical History:  Diagnosis Date  . Anemia   . Aortic stenosis    mild by echo 08/2016, not mentioned in 06/2018 TEE  . Breast CA ([place])    left  . Carotid stenosis    1-39% left  . Degenerative disc disease, lumbar    of the knees  . Diabetes mellitus   . 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[place])   . Fatigue    Severe secondary to to gemfibrozil  . Gastroesophageal reflux disease    denies  . GI bleeding    a. 06/2018: EGD showed multiple nonbleeding duodenal ulcers. 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  . Hammertoe    left second toe  . Hypercholesteremia   . Hypertension   . Insomnia   . Irritable bowel   . Low back pain   . Metabolic syndrome   . Mild mitral regurgitation 06/2018  . Morbid obesity ([place])   . OSA on CPAP \   bipap  . Osteoarthritis   . Osteopenia   . Persistent atrial fibrillation   . Personal history of radiation therapy   . Pneumonia   . PONV (postoperative nausea and vomiting)   . Sigmoid diverticulosis   . Thalassemia minor   . Thalassemia minor   . Vitamin D deficiency    Past Surgical History:  Procedure Laterality Date  . BIOPSY  07/06/2018   Procedure: BIOPSY;  Surgeon: Clarene Essex, MD;  Location: [place];  Service: Endoscopy;;  . BREAST LUMPECTOMY Left 05/28/2009  . BREAST SURGERY Left 10   lumpectomy  . CARDIOVERSION N/A 07/07/2018   Procedure: CARDIOVERSION;  Surgeon: [name], MD;  Location: [facility] ENDOSCOPY;  Service: Cardiovascular;  Laterality: N/A;  . CARDIOVERSION N/A 07/14/2018   Procedure: CARDIOVERSION;  Surgeon: Larey Dresser, MD;  Location: [facility] ENDOSCOPY;  Service: Cardiovascular;  Laterality: N/A;  . CESAREAN SECTION    . COLONOSCOPY WITH PROPOFOL N/A 07/06/2018   Procedure: COLONOSCOPY WITH PROPOFOL;  Surgeon: Clarene Essex, MD;  Location: [place];  Service: Endoscopy;  Laterality: N/A;  . CYSTECTOMY  70   pilonidal   . DILATION AND CURETTAGE OF UTERUS    . ESOPHAGOGASTRODUODENOSCOPY (EGD) WITH PROPOFOL N/A 07/06/2018   Procedure: ESOPHAGOGASTRODUODENOSCOPY (EGD) WITH PROPOFOL;  Surgeon: Clarene Essex, MD;  Location: [place];  Service: Endoscopy;  Laterality: N/A;  . EYE SURGERY Bilateral 12/14  cataracts  . HOT HEMOSTASIS N/A 07/06/2018   Procedure: HOT HEMOSTASIS (ARGON PLASMA COAGULATION/BICAP);  Surgeon: Clarene Essex, MD;  Location: [place];  Service: Endoscopy;  Laterality: N/A;  . MAXIMUM ACCESS (MAS)POSTERIOR LUMBAR INTERBODY FUSION (PLIF) 2 LEVEL N/A 09/22/2013   Procedure: FOR MAXIMUM ACCESS  POSTERIOR LUMBAR INTERBODY FUSION LUMBAR THREE-FOUR FOUR-FIVE;  Surgeon: Eustace Moore, MD;  Location: [place] NEURO ORS;  Service: Neurosurgery;  Laterality: N/A;  . OOPHORECTOMY     wedge section not rem  . 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  REPLACEMENT TOTAL KNEE    . TEE WITHOUT CARDIOVERSION N/A 07/07/2018   Procedure: TRANSESOPHAGEAL ECHOCARDIOGRAM (TEE);  Surgeon: [name], MD;  Location: [place];  Service: Cardiovascular;  Laterality: N/A;  . TONSILLECTOMY    . TUBAL LIGATION       Current Outpatient Medications  Medication Sig Dispense Refill  . acetaminophen (TYLENOL) 500 MG tablet Take 500 mg by mouth every 6 (six) hours as needed for mild pain. For back pain    . amiodarone (PACERONE) 200 MG tablet Take 1 tablet (200 mg total) by mouth 2 (two) times daily. 60 tablet 0  . Biotin 10 MG TABS Take 10 mg by mouth 2 (two) times daily.    . Calcium Carbonate-Vitamin D (CALCIUM 600 + D PO) Take 1 tablet by mouth daily.     Marland Kitchen diltiazem (CARDIZEM CD) 240 MG 24 hr capsule Take 1 capsule (240 mg total) by mouth every 12 (twelve) hours. 60 capsule 11  . glimepiride (AMARYL) 4 MG tablet Take 2 mg by mouth daily.   3  . metFORMIN (GLUCOPHAGE-XR) 500 MG 24 hr tablet Take 1 tablet by mouth 2 (two) times daily.  3  . Multiple Vitamin (MULITIVITAMIN WITH MINERALS) TABS Take 1 tablet by mouth daily.    . pantoprazole (PROTONIX) 40 MG tablet Take 1 tablet (40 mg total) by mouth daily. 90 tablet 3  . warfarin (COUMADIN) 4 MG tablet Take 1 tablet (4 mg total) by mouth daily at 6 PM. (5 mg  Thursday. 4 mg all the other days) 30 tablet 0   No current facility-administered medications for this visit.     Allergies:   Femara [letrozole]; Gemfibrozil; Hydrocodone-acetaminophen; Letrozole; Metoprolol; Other; Tamoxifen; Tetracycline; Tetracyclines & related; Toprol xl [metoprolol tartrate]; Verapamil; Vicodin [hydrocodone-acetaminophen]; and Aromasin [exemestane]   Social History:  The patient  reports that she quit smoking about 14 years ago. Her smoking use included cigarettes. She has a 5.00 pack-year smoking history. She has never used smokeless tobacco. She reports current alcohol use. She reports that she does not use drugs.    Family History:  The patient's  family history includes Anesthesia problems in her mother; Diabetes in her father and mother.    ROS:  Please see the history of present illness.   All other systems are personally reviewed and negative.    PHYSICAL EXAM: VS:  BP 126/60   Pulse 74   Ht 5\' 5"  (1.651 m)   Wt 248 lb (112.5 kg)   SpO2 97%   BMI 41.27 kg/m  , BMI Body mass index is 41.27 kg/m. GEN: obese, in no acute distress  HEENT: normal  Neck: no JVD, carotid bruits, or masses Cardiac: RRR; 2/6 SEM LUSB which is mid peaking Respiratory:  clear to auscultation bilaterally, normal work of breathing GI: soft, nontender, nondistended, + BS MS: no deformity or atrophy  Skin: warm and dry  Neuro:  Strength and sensation are intact Psych:  euthymic mood, full affect  EKG:  EKG is ordered today. The ekg ordered today is personally reviewed and shows sinus rhythm 74 bpm, PR 150 msec, QRS 86 msec, QTc 406 msec   Recent Labs: 07/12/2018: ALT 55 07/16/2018: B Natriuretic Peptide 108.0 07/21/2018: Magnesium 1.8; TSH 3.295 07/23/2018: BUN 19; Creatinine, Ser 1.26; Hemoglobin 8.9; Platelets 148; Potassium 4.3; Sodium 138  personally reviewed   Lipid Panel     Component Value Date/Time   CHOL  05/14/2008 0435    146        ATP III CLASSIFICATION:  <200     mg/dL   Desirable  200-239  mg/dL   Borderline High  >=240    mg/dL   High   TRIG 105 05/14/2008 0435   HDL 27 (L) 05/14/2008 0435   CHOLHDL 5.4 05/14/2008 0435   VLDL 21 05/14/2008 0435   LDLCALC  05/14/2008 0435    98        Total Cholesterol/HDL:CHD Risk Coronary Heart Disease Risk Table                     Men   Women  1/2 Average Risk   3.4   3.3   personally reviewed   Wt Readings from Last 3 Encounters:  08/09/18 248 lb (112.5 kg)  08/06/18 244 lb 12.8 oz (111 kg)  08/02/18 245 lb (111.1 kg)    Other studies personally reviewed: Additional studies/ records that were reviewed today include: AF clinic notes, my  prior note from 07/21/18  Review of the above records today demonstrates: as above  ASSESSMENT AND PLAN:  1.  Persistent atrial fibrillation Continues to have intermittent afib despite amiodarone. chads2vasc score is 4.  She is on coumadin.  Therapeutic strategies for afib including medicine and ablation were discussed in detail with the patient today. Risk, benefits, and alternatives to EP study and radiofrequency ablation for afib were also discussed in detail today. These risks include but are not limited to stroke, bleeding, vascular damage, tamponade, perforation, damage to the esophagus, lungs, and other structures, pulmonary vein stenosis, worsening renal function, and death. The patient understands these risk and wishes to proceed.  We will therefore proceed with catheter ablation at the next available time.  Carto, ICE, anesthesia are requested for the procedure.  Will also obtain TEE prior to the procedure to exclude LAA thrombus and further evaluate atrial anatomy. Reduce amiodarone to 200 mg daily today Weekly INRs prior to ablation  2.  CP/ SOB unclear etiology Has not yet had her cardiac CT with FFR.  Will schedule to evaluate her SOB and CP  3. Morbid obesity I have reviewed the patients BMI and decreased success rates with ablation at length today.  Weight loss is strongly advised.  Per Guijian et al (PACE 2013; 36: 366-440), patients with BMI 25-29.9 (obese) have a 27% increase in AF recurrence post ablation.  Patients with BMI >30 have a 31% increase in AF recurrence post ablation when compared to those with BMI <25.  4. OSA Compliance with CPAP encouraged She has "phlem" at night which limits her ability to use We discussed Ocean mist.  I will also try mucinex.  Follow-up with PCP   Current medicines are reviewed at length with the patient today.   The patient does not have concerns regarding her medicines.  The following changes were made today:  none  Labs/ tests  ordered today include:  Orders Placed This Encounter  Procedures  .  EKG 12-Lead     Signed, Thompson Grayer, MD  08/09/2018 11:23 AM     [facility] [address] [place]  [address] [phone] (office) [phone] (fax)

## 2018-08-12 ENCOUNTER — Other Ambulatory Visit: Payer: Self-pay | Admitting: Pharmacist

## 2018-08-12 MED ORDER — WARFARIN SODIUM 4 MG PO TABS: 4.0000 mg | ORAL_TABLET | Freq: Every day | ORAL | 0 refills | Status: DC

## 2018-08-17 ENCOUNTER — Ambulatory Visit (INDEPENDENT_AMBULATORY_CARE_PROVIDER_SITE_OTHER): Payer: Medicare Other | Admitting: *Deleted

## 2018-08-17 DIAGNOSIS — I4891 Unspecified atrial fibrillation: Secondary | ICD-10-CM

## 2018-08-17 DIAGNOSIS — Z5181 Encounter for therapeutic drug level monitoring: Secondary | ICD-10-CM

## 2018-08-17 HISTORY — DX: Encounter for therapeutic drug level monitoring: Z51.81

## 2018-08-17 LAB — POCT INR: INR: 2.2 (ref 2.0–3.0)

## 2018-08-17 NOTE — Patient Instructions (Addendum)
A full discussion of the nature of anticoagulants has been carried out.  A benefit risk analysis has been presented to the patient, so that they understand the justification for choosing anticoagulation at this time. The need for frequent and regular monitoring, precise dosage adjustment and compliance is stressed.  Side effects of potential bleeding are discussed.  The patient should avoid any OTC items containing aspirin or ibuprofen, and should avoid great swings in general diet.  Avoid alcohol consumption.  Call if any signs of abnormal bleeding.  Description   Continue taking Warfarin 4mg  daily except 5mg  on Thursdays. Recheck INR in 1 week-pending Ablation.  Coumadin Clinic [phone] Main [phone]

## 2018-08-24 ENCOUNTER — Ambulatory Visit (INDEPENDENT_AMBULATORY_CARE_PROVIDER_SITE_OTHER): Payer: Medicare Other | Admitting: *Deleted

## 2018-08-24 DIAGNOSIS — Z5181 Encounter for therapeutic drug level monitoring: Secondary | ICD-10-CM

## 2018-08-24 DIAGNOSIS — I4891 Unspecified atrial fibrillation: Secondary | ICD-10-CM | POA: Diagnosis not present

## 2018-08-24 LAB — POCT INR: INR: 2.1 (ref 2.0–3.0)

## 2018-08-24 NOTE — Patient Instructions (Signed)
Description   Continue taking Warfarin 4mg  daily except 5mg  on Thursdays. Recheck INR in 1 week-pending Ablation.  Coumadin Clinic [phone] Main [phone]

## 2018-08-26 MED ORDER — AMIODARONE HCL 200 MG PO TABS: 200.0000 mg | ORAL_TABLET | Freq: Every day | ORAL | 3 refills | Status: DC

## 2018-08-30 ENCOUNTER — Ambulatory Visit (INDEPENDENT_AMBULATORY_CARE_PROVIDER_SITE_OTHER): Payer: Medicare Other | Admitting: *Deleted

## 2018-08-30 DIAGNOSIS — I4891 Unspecified atrial fibrillation: Secondary | ICD-10-CM

## 2018-08-30 DIAGNOSIS — Z5181 Encounter for therapeutic drug level monitoring: Secondary | ICD-10-CM

## 2018-08-30 LAB — POCT INR: INR: 2.5 (ref 2.0–3.0)

## 2018-09-01 ENCOUNTER — Telehealth (HOSPITAL_COMMUNITY): Payer: Self-pay | Admitting: Emergency Medicine

## 2018-09-01 NOTE — Telephone Encounter (Signed)
Message sent in my chart

## 2018-09-01 NOTE — Telephone Encounter (Signed)
Phone rang for 4 mins without answering service, will try again

## 2018-09-02 ENCOUNTER — Telehealth (HOSPITAL_COMMUNITY): Payer: Self-pay | Admitting: Emergency Medicine

## 2018-09-02 NOTE — Telephone Encounter (Signed)
Returning patients phone call regarding metoprolol-- patient states she used it in the past and caused extreme shortness of breath with sats in the 70's Per Dr. Meda Coffee, cardiac image reader, she should not take this medication, that her 240mg  24 hr cardizem will suffice  All questions answered and patient given my name and callback number for further questions Marchia Bond RN Navigator Cardiac Imaging [phone]

## 2018-09-03 ENCOUNTER — Encounter: Payer: Self-pay | Admitting: Internal Medicine

## 2018-09-03 ENCOUNTER — Ambulatory Visit (HOSPITAL_COMMUNITY)
Admission: RE | Admit: 2018-09-03 | Discharge: 2018-09-03 | Disposition: A | Discharge status: Home / Self Care | Payer: Medicare Other | Source: Ambulatory Visit | Attending: Internal Medicine | Admitting: Internal Medicine

## 2018-09-03 ENCOUNTER — Ambulatory Visit (HOSPITAL_COMMUNITY): Admission: RE | Admit: 2018-09-03 | Payer: Medicare Other | Source: Ambulatory Visit

## 2018-09-03 DIAGNOSIS — I4891 Unspecified atrial fibrillation: Secondary | ICD-10-CM | POA: Insufficient documentation

## 2018-09-03 DIAGNOSIS — I2510 Atherosclerotic heart disease of native coronary artery without angina pectoris: Secondary | ICD-10-CM | POA: Insufficient documentation

## 2018-09-03 DIAGNOSIS — R079 Chest pain, unspecified: Secondary | ICD-10-CM | POA: Insufficient documentation

## 2018-09-03 DIAGNOSIS — R0602 Shortness of breath: Secondary | ICD-10-CM

## 2018-09-03 MED ORDER — NITROGLYCERIN 0.4 MG SL SUBL
SUBLINGUAL_TABLET | SUBLINGUAL | Status: AC
  Filled 2018-09-03: qty 2

## 2018-09-03 MED ORDER — NITROGLYCERIN 0.4 MG SL SUBL
0.8000 mg | SUBLINGUAL_TABLET | SUBLINGUAL | Status: DC | PRN
  Administered 2018-09-03: 0.8 mg via SUBLINGUAL

## 2018-09-03 MED ORDER — IOPAMIDOL (ISOVUE-370) INJECTION 76%
80.0000 mL | Freq: Once | INTRAVENOUS | Status: AC | PRN
  Administered 2018-09-03: 80 mL via INTRAVENOUS

## 2018-09-06 ENCOUNTER — Ambulatory Visit (INDEPENDENT_AMBULATORY_CARE_PROVIDER_SITE_OTHER): Payer: Medicare Other | Admitting: Pharmacist

## 2018-09-06 DIAGNOSIS — Z5181 Encounter for therapeutic drug level monitoring: Secondary | ICD-10-CM

## 2018-09-06 DIAGNOSIS — I4891 Unspecified atrial fibrillation: Secondary | ICD-10-CM | POA: Diagnosis not present

## 2018-09-06 LAB — POCT INR: INR: 3 (ref 2.0–3.0)

## 2018-09-06 NOTE — Patient Instructions (Signed)
Description   Continue taking Warfarin 4mg  daily except 5mg  on Thursdays. Recheck INR in 1 week-pending Ablation.  Coumadin Clinic [phone] Main [phone]

## 2018-09-13 ENCOUNTER — Ambulatory Visit (INDEPENDENT_AMBULATORY_CARE_PROVIDER_SITE_OTHER): Payer: Medicare Other | Admitting: *Deleted

## 2018-09-13 DIAGNOSIS — I4891 Unspecified atrial fibrillation: Secondary | ICD-10-CM

## 2018-09-13 DIAGNOSIS — Z5181 Encounter for therapeutic drug level monitoring: Secondary | ICD-10-CM

## 2018-09-13 LAB — POCT INR: INR: 2.6 (ref 2.0–3.0)

## 2018-09-13 NOTE — Patient Instructions (Signed)
Description   Continue taking Warfarin 4mg  daily except 5mg  on Thursdays. Recheck INR in 18 days-pending Ablation on 09/21/18.   Coumadin Clinic [phone] Main [phone]

## 2018-09-21 ENCOUNTER — Encounter (HOSPITAL_COMMUNITY)
Admission: RE | Disposition: A | Discharge status: Home / Self Care | Payer: Self-pay | Source: Home / Self Care | Attending: Internal Medicine

## 2018-09-21 ENCOUNTER — Other Ambulatory Visit: Payer: Self-pay

## 2018-09-21 ENCOUNTER — Ambulatory Visit (HOSPITAL_COMMUNITY): Payer: Medicare Other | Admitting: Anesthesiology

## 2018-09-21 ENCOUNTER — Ambulatory Visit (HOSPITAL_BASED_OUTPATIENT_CLINIC_OR_DEPARTMENT_OTHER): Payer: Medicare Other

## 2018-09-21 ENCOUNTER — Ambulatory Visit (HOSPITAL_COMMUNITY): Admission: RE | Admit: 2018-09-21 | Payer: Medicare Other | Source: Home / Self Care | Admitting: Internal Medicine

## 2018-09-21 ENCOUNTER — Ambulatory Visit (HOSPITAL_COMMUNITY)
Admission: RE | Admit: 2018-09-21 | Discharge: 2018-09-22 | Disposition: A | Discharge status: Home / Self Care | Payer: Medicare Other | Attending: Internal Medicine | Admitting: Internal Medicine

## 2018-09-21 ENCOUNTER — Encounter (HOSPITAL_COMMUNITY): Payer: Self-pay

## 2018-09-21 DIAGNOSIS — E7800 Pure hypercholesterolemia, unspecified: Secondary | ICD-10-CM | POA: Insufficient documentation

## 2018-09-21 DIAGNOSIS — D563 Thalassemia minor: Secondary | ICD-10-CM | POA: Diagnosis not present

## 2018-09-21 DIAGNOSIS — I4819 Other persistent atrial fibrillation: Secondary | ICD-10-CM | POA: Diagnosis present

## 2018-09-21 DIAGNOSIS — K219 Gastro-esophageal reflux disease without esophagitis: Secondary | ICD-10-CM | POA: Insufficient documentation

## 2018-09-21 DIAGNOSIS — Z7901 Long term (current) use of anticoagulants: Secondary | ICD-10-CM | POA: Diagnosis not present

## 2018-09-21 DIAGNOSIS — Z885 Allergy status to narcotic agent status: Secondary | ICD-10-CM | POA: Insufficient documentation

## 2018-09-21 DIAGNOSIS — M8580 Other specified disorders of bone density and structure, unspecified site: Secondary | ICD-10-CM | POA: Diagnosis not present

## 2018-09-21 DIAGNOSIS — E1122 Type 2 diabetes mellitus with diabetic chronic kidney disease: Secondary | ICD-10-CM | POA: Diagnosis not present

## 2018-09-21 DIAGNOSIS — I351 Nonrheumatic aortic (valve) insufficiency: Secondary | ICD-10-CM

## 2018-09-21 DIAGNOSIS — E559 Vitamin D deficiency, unspecified: Secondary | ICD-10-CM | POA: Diagnosis not present

## 2018-09-21 DIAGNOSIS — G4733 Obstructive sleep apnea (adult) (pediatric): Secondary | ICD-10-CM | POA: Diagnosis not present

## 2018-09-21 DIAGNOSIS — Z881 Allergy status to other antibiotic agents status: Secondary | ICD-10-CM | POA: Insufficient documentation

## 2018-09-21 DIAGNOSIS — G4700 Insomnia, unspecified: Secondary | ICD-10-CM | POA: Insufficient documentation

## 2018-09-21 DIAGNOSIS — Z87891 Personal history of nicotine dependence: Secondary | ICD-10-CM | POA: Diagnosis not present

## 2018-09-21 DIAGNOSIS — M5136 Other intervertebral disc degeneration, lumbar region: Secondary | ICD-10-CM | POA: Insufficient documentation

## 2018-09-21 DIAGNOSIS — Z6841 Body Mass Index (BMI) 40.0 and over, adult: Secondary | ICD-10-CM | POA: Diagnosis not present

## 2018-09-21 DIAGNOSIS — Z853 Personal history of malignant neoplasm of breast: Secondary | ICD-10-CM | POA: Insufficient documentation

## 2018-09-21 DIAGNOSIS — N183 Chronic kidney disease, stage 3 (moderate): Secondary | ICD-10-CM | POA: Diagnosis not present

## 2018-09-21 DIAGNOSIS — I129 Hypertensive chronic kidney disease with stage 1 through stage 4 chronic kidney disease, or unspecified chronic kidney disease: Secondary | ICD-10-CM | POA: Diagnosis not present

## 2018-09-21 DIAGNOSIS — I4891 Unspecified atrial fibrillation: Secondary | ICD-10-CM

## 2018-09-21 DIAGNOSIS — Z7984 Long term (current) use of oral hypoglycemic drugs: Secondary | ICD-10-CM | POA: Insufficient documentation

## 2018-09-21 DIAGNOSIS — I350 Nonrheumatic aortic (valve) stenosis: Secondary | ICD-10-CM | POA: Diagnosis not present

## 2018-09-21 HISTORY — PX: ATRIAL FIBRILLATION ABLATION: EP1191

## 2018-09-21 HISTORY — PX: TEE WITHOUT CARDIOVERSION: SHX5443

## 2018-09-21 LAB — PROTIME-INR
INR: 2.06
Prothrombin Time: 23 seconds — ABNORMAL HIGH (ref 11.4–15.2)

## 2018-09-21 LAB — BASIC METABOLIC PANEL
Anion gap: 13 (ref 5–15)
BUN: 22 mg/dL (ref 8–23)
CO2: 23 mmol/L (ref 22–32)
Calcium: 9.4 mg/dL (ref 8.9–10.3)
Chloride: 104 mmol/L (ref 98–111)
Creatinine, Ser: 1.09 mg/dL — ABNORMAL HIGH (ref 0.44–1.00)
GFR calc Af Amer: 59 mL/min — ABNORMAL LOW (ref >60)
GFR calc non Af Amer: 51 mL/min — ABNORMAL LOW (ref >60)
Glucose, Bld: 120 mg/dL — ABNORMAL HIGH (ref 70–99)
Potassium: 4.5 mmol/L (ref 3.5–5.1)
Sodium: 140 mmol/L (ref 135–145)

## 2018-09-21 LAB — CBC
HCT: 35.4 % — ABNORMAL LOW (ref 36.0–46.0)
Hemoglobin: 9.7 g/dL — ABNORMAL LOW (ref 12.0–15.0)
MCH: 16.8 pg — ABNORMAL LOW (ref 26.0–34.0)
MCHC: 27.4 g/dL — ABNORMAL LOW (ref 30.0–36.0)
MCV: 61.1 fL — ABNORMAL LOW (ref 80.0–100.0)
Platelets: 161 10*3/uL (ref 150–400)
RBC: 5.79 MIL/uL — ABNORMAL HIGH (ref 3.87–5.11)
RDW: 22 % — ABNORMAL HIGH (ref 11.5–15.5)
WBC: 6.4 10*3/uL (ref 4.0–10.5)
nRBC: 0 % (ref 0.0–0.2)

## 2018-09-21 LAB — GLUCOSE, CAPILLARY
Glucose-Capillary: 150 mg/dL — ABNORMAL HIGH (ref 70–99)
Glucose-Capillary: 142 mg/dL — ABNORMAL HIGH (ref 70–99)
Glucose-Capillary: 211 mg/dL — ABNORMAL HIGH (ref 70–99)

## 2018-09-21 LAB — POCT ACTIVATED CLOTTING TIME: Activated Clotting Time: 180 seconds

## 2018-09-21 SURGERY — ECHOCARDIOGRAM, TRANSESOPHAGEAL
Anesthesia: Moderate Sedation

## 2018-09-21 SURGERY — ATRIAL FIBRILLATION ABLATION
Anesthesia: General

## 2018-09-21 MED ORDER — HEPARIN SODIUM (PORCINE) 1000 UNIT/ML IJ SOLN
INTRAMUSCULAR | Status: DC | PRN
  Administered 2018-09-21 (×2): 4000 [IU] via INTRAVENOUS
  Administered 2018-09-21: 2000 [IU] via INTRAVENOUS

## 2018-09-21 MED ORDER — ISOPROTERENOL HCL 0.2 MG/ML IJ SOLN
INTRAVENOUS | Status: DC | PRN
  Administered 2018-09-21: 10 ug/min via INTRAVENOUS

## 2018-09-21 MED ORDER — GUAIFENESIN-DM 100-10 MG/5ML PO SYRP
15.0000 mL | ORAL_SOLUTION | ORAL | Status: DC | PRN
  Administered 2018-09-21: 15 mL via ORAL
  Filled 2018-09-21: qty 15

## 2018-09-21 MED ORDER — LIDOCAINE 2% (20 MG/ML) 5 ML SYRINGE
INTRAMUSCULAR | Status: DC | PRN
  Administered 2018-09-21: 80 mg via INTRAVENOUS

## 2018-09-21 MED ORDER — ACETAMINOPHEN 500 MG PO TABS: 500.0000 mg | ORAL_TABLET | Freq: Every evening | ORAL | Status: DC | PRN

## 2018-09-21 MED ORDER — LIDOCAINE HCL (PF) 1 % IJ SOLN
INTRAMUSCULAR | Status: DC | PRN
  Administered 2018-09-21: 30 mL

## 2018-09-21 MED ORDER — FENTANYL CITRATE (PF) 100 MCG/2ML IJ SOLN
INTRAMUSCULAR | Status: DC | PRN
  Administered 2018-09-21 (×3): 25 ug via INTRAVENOUS

## 2018-09-21 MED ORDER — MIDAZOLAM HCL (PF) 5 MG/ML IJ SOLN
INTRAMUSCULAR | Status: AC
  Filled 2018-09-21: qty 2

## 2018-09-21 MED ORDER — ACETAMINOPHEN 325 MG PO TABS
650.0000 mg | ORAL_TABLET | ORAL | Status: DC | PRN
  Administered 2018-09-21: 650 mg via ORAL

## 2018-09-21 MED ORDER — EPHEDRINE SULFATE-NACL 50-0.9 MG/10ML-% IV SOSY
PREFILLED_SYRINGE | INTRAVENOUS | Status: DC | PRN
  Administered 2018-09-21 (×2): 5 mg via INTRAVENOUS

## 2018-09-21 MED ORDER — MIDAZOLAM HCL 5 MG/5ML IJ SOLN
INTRAMUSCULAR | Status: DC | PRN
  Administered 2018-09-21: 2 mg via INTRAVENOUS

## 2018-09-21 MED ORDER — ROCURONIUM BROMIDE 50 MG/5ML IV SOSY
PREFILLED_SYRINGE | INTRAVENOUS | Status: DC | PRN
  Administered 2018-09-21: 50 mg via INTRAVENOUS
  Administered 2018-09-21: 15 mg via INTRAVENOUS
  Administered 2018-09-21: 50 mg via INTRAVENOUS

## 2018-09-21 MED ORDER — SODIUM CHLORIDE 0.9 % IV SOLN: INTRAVENOUS | Status: DC

## 2018-09-21 MED ORDER — DIPHENHYDRAMINE-APAP (SLEEP) 25-500 MG PO TABS: 2.0000 | ORAL_TABLET | Freq: Every day | ORAL | Status: DC

## 2018-09-21 MED ORDER — METFORMIN HCL ER 500 MG PO TB24
500.0000 mg | ORAL_TABLET | Freq: Two times a day (BID) | ORAL | Status: DC
  Administered 2018-09-21 – 2018-09-22 (×2): 500 mg via ORAL
  Filled 2018-09-21 (×2): qty 1

## 2018-09-21 MED ORDER — HEPARIN SODIUM (PORCINE) 1000 UNIT/ML IJ SOLN
INTRAMUSCULAR | Status: DC | PRN
  Administered 2018-09-21: 10000 [IU] via INTRAVENOUS
  Administered 2018-09-21 (×2): 1000 [IU] via INTRAVENOUS

## 2018-09-21 MED ORDER — SODIUM CHLORIDE 0.9 % IV SOLN: 250.0000 mL | INTRAVENOUS | Status: DC | PRN

## 2018-09-21 MED ORDER — MIDAZOLAM HCL (PF) 10 MG/2ML IJ SOLN
INTRAMUSCULAR | Status: DC | PRN
  Administered 2018-09-21: 2 mg via INTRAVENOUS
  Administered 2018-09-21: 1 mg via INTRAVENOUS
  Administered 2018-09-21: 2 mg via INTRAVENOUS

## 2018-09-21 MED ORDER — PROTAMINE SULFATE 10 MG/ML IV SOLN
INTRAVENOUS | Status: DC | PRN
  Administered 2018-09-21: 30 mg via INTRAVENOUS

## 2018-09-21 MED ORDER — ONDANSETRON HCL 4 MG/2ML IJ SOLN
4.0000 mg | Freq: Four times a day (QID) | INTRAMUSCULAR | Status: DC | PRN
  Administered 2018-09-21: 4 mg via INTRAVENOUS

## 2018-09-21 MED ORDER — SODIUM CHLORIDE 0.9% FLUSH: 3.0000 mL | Freq: Two times a day (BID) | INTRAVENOUS | Status: DC

## 2018-09-21 MED ORDER — ONDANSETRON HCL 4 MG/2ML IJ SOLN
INTRAMUSCULAR | Status: DC | PRN
  Administered 2018-09-21: 4 mg via INTRAVENOUS

## 2018-09-21 MED ORDER — SUGAMMADEX SODIUM 200 MG/2ML IV SOLN
INTRAVENOUS | Status: DC | PRN
  Administered 2018-09-21: 250 mg via INTRAVENOUS

## 2018-09-21 MED ORDER — HEPARIN (PORCINE) IN NACL 1000-0.9 UT/500ML-% IV SOLN
INTRAVENOUS | Status: DC | PRN
  Administered 2018-09-21: 500 mL

## 2018-09-21 MED ORDER — ONDANSETRON HCL 4 MG/2ML IJ SOLN
INTRAMUSCULAR | Status: AC
  Filled 2018-09-21: qty 2

## 2018-09-21 MED ORDER — WARFARIN - PHYSICIAN DOSING INPATIENT
Freq: Every day | Status: DC
  Administered 2018-09-21: 18:00:00

## 2018-09-21 MED ORDER — PANTOPRAZOLE SODIUM 40 MG PO TBEC
40.0000 mg | DELAYED_RELEASE_TABLET | Freq: Every day | ORAL | Status: DC
  Administered 2018-09-22: 40 mg via ORAL
  Filled 2018-09-21: qty 1

## 2018-09-21 MED ORDER — WARFARIN SODIUM 5 MG PO TABS
5.0000 mg | ORAL_TABLET | Freq: Once | ORAL | Status: AC
  Administered 2018-09-21: 5 mg via ORAL
  Filled 2018-09-21: qty 1

## 2018-09-21 MED ORDER — ACETAMINOPHEN 325 MG PO TABS
ORAL_TABLET | ORAL | Status: AC
  Filled 2018-09-21: qty 2

## 2018-09-21 MED ORDER — PROPOFOL 10 MG/ML IV BOLUS
INTRAVENOUS | Status: DC | PRN
  Administered 2018-09-21: 160 mg via INTRAVENOUS

## 2018-09-21 MED ORDER — SODIUM CHLORIDE 0.9% FLUSH: 3.0000 mL | INTRAVENOUS | Status: DC | PRN

## 2018-09-21 MED ORDER — DEXAMETHASONE SODIUM PHOSPHATE 10 MG/ML IJ SOLN
INTRAMUSCULAR | Status: DC | PRN
  Administered 2018-09-21: 5 mg via INTRAVENOUS

## 2018-09-21 MED ORDER — SODIUM CHLORIDE 0.9 % IV SOLN
INTRAVENOUS | Status: DC | PRN
  Administered 2018-09-21: 20 ug/min via INTRAVENOUS

## 2018-09-21 MED ORDER — FENTANYL CITRATE (PF) 100 MCG/2ML IJ SOLN
INTRAMUSCULAR | Status: DC | PRN
  Administered 2018-09-21: 100 ug via INTRAVENOUS

## 2018-09-21 MED ORDER — LACTATED RINGERS IV SOLN
INTRAVENOUS | Status: DC | PRN
  Administered 2018-09-21 (×2): via INTRAVENOUS

## 2018-09-21 MED ORDER — FENTANYL CITRATE (PF) 100 MCG/2ML IJ SOLN
INTRAMUSCULAR | Status: AC
  Filled 2018-09-21: qty 2

## 2018-09-21 MED ORDER — DIPHENHYDRAMINE HCL 25 MG PO CAPS: 25.0000 mg | ORAL_CAPSULE | Freq: Every evening | ORAL | Status: DC | PRN

## 2018-09-21 MED ORDER — GLIMEPIRIDE 1 MG PO TABS
2.0000 mg | ORAL_TABLET | Freq: Every day | ORAL | Status: DC
  Administered 2018-09-22: 2 mg via ORAL
  Filled 2018-09-21: qty 2

## 2018-09-21 SURGICAL SUPPLY — 19 items
BLANKET WARM UNDERBOD FULL ACC (MISCELLANEOUS) ×2 IMPLANT
CATH ACUNAV REPROCESSED (CATHETERS) ×2 IMPLANT
CATH MAPPNG PENTARAY F 2-6-2MM (CATHETERS) ×1 IMPLANT
CATH NAVISTAR SMARTTOUCH DF (ABLATOR) ×2 IMPLANT
CATH SOUNDSTAR ECO 8FR (CATHETERS) ×2 IMPLANT
CATH WEBSTER BI DIR CS D-F CRV (CATHETERS) ×2 IMPLANT
COVER SWIFTLINK CONNECTOR (BAG) ×2 IMPLANT
NEEDLE BAYLIS TRANSSEPTAL 71CM (NEEDLE) ×2 IMPLANT
PACK EP LATEX FREE (CUSTOM PROCEDURE TRAY) ×1
PACK EP LF (CUSTOM PROCEDURE TRAY) ×1 IMPLANT
PAD PRO RADIOLUCENT 2001M-C (PAD) ×2 IMPLANT
PATCH CARTO3 (PAD) ×2 IMPLANT
PENTARAY F 2-6-2MM (CATHETERS) ×2
SHEATH AVANTI 11F 11CM (SHEATH) IMPLANT
SHEATH PINNACLE 7F 10CM (SHEATH) ×4 IMPLANT
SHEATH PINNACLE 8F 10CM (SHEATH) ×2 IMPLANT
SHEATH PINNACLE 9F 10CM (SHEATH) ×2 IMPLANT
SHEATH SWARTZ TS SL2 63CM 8.5F (SHEATH) ×2 IMPLANT
TUBING SMART ABLATE COOLFLOW (TUBING) ×2 IMPLANT

## 2018-09-21 NOTE — Progress Notes (Signed)
Pt's bedrest ended at 2115. Ambulated pt to bathroom with no incident. Right groin is a level 0. Will continue to monitor.

## 2018-09-21 NOTE — H&P (Signed)
PCP:  Josetta Huddle, MD        Cardiologist:  Dr Radford Pax Primary Electrophysiologist: Thompson Grayer, MD       CC: afib   History of Present Illness: Jenna Mcdonald is a 73 y.o. female who presents today for electrophysiology study and ablation for atrial fibrillation.    She has had symptomatic afib.  She failed medical therapy with flecainide.   She was started on amiodarone.  She has had ongoing afib but has since converted to sinus rhythm.    Still having exertional SOB and chest discomfort.  She had cardiac CT which revealed no obstructive CAD.  Today, she denies symptoms of palpitations, orthopnea, PND, lower extremity edema, claudication, dizziness, presyncope, syncope, bleeding, or neurologic sequela. The patient is tolerating medications without difficulties and is otherwise without complaint today.        Past Medical History:  Diagnosis Date  . Anemia   . Aortic stenosis    mild by echo 08/2016, not mentioned in 06/2018 TEE  . Breast CA ([place])    left  . Carotid stenosis    1-39% left  . Degenerative disc disease, lumbar    of the knees  . Diabetes mellitus   . 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[place])   . Fatigue    Severe secondary to to gemfibrozil  . Gastroesophageal reflux disease    denies  . GI bleeding    a. 06/2018: EGD showed multiple nonbleeding duodenal ulcers. 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  . Hammertoe    left second toe  . Hypercholesteremia   . Hypertension   . Insomnia   . Irritable bowel   . Low back pain   . Metabolic syndrome   . Mild mitral regurgitation 06/2018  . Morbid obesity ([place])   . OSA on CPAP \   bipap  . Osteoarthritis   . Osteopenia   . Persistent atrial fibrillation   . Personal history of radiation therapy   . Pneumonia   . PONV (postoperative nausea and vomiting)   . Sigmoid diverticulosis   . Thalassemia minor   .  Thalassemia minor   . Vitamin D deficiency         Past Surgical History:  Procedure Laterality Date  . BIOPSY  07/06/2018   Procedure: BIOPSY;  Surgeon: Clarene Essex, MD;  Location: [place];  Service: Endoscopy;;  . BREAST LUMPECTOMY Left 05/28/2009  . BREAST SURGERY Left 10   lumpectomy  . CARDIOVERSION N/A 07/07/2018   Procedure: CARDIOVERSION;  Surgeon: [name], MD;  Location: [facility] ENDOSCOPY;  Service: Cardiovascular;  Laterality: N/A;  . CARDIOVERSION N/A 07/14/2018   Procedure: CARDIOVERSION;  Surgeon: Larey Dresser, MD;  Location: [facility] ENDOSCOPY;  Service: Cardiovascular;  Laterality: N/A;  . CESAREAN SECTION    . COLONOSCOPY WITH PROPOFOL N/A 07/06/2018   Procedure: COLONOSCOPY WITH PROPOFOL;  Surgeon: Clarene Essex, MD;  Location: [place];  Service: Endoscopy;  Laterality: N/A;  . CYSTECTOMY  70   pilonidal   . DILATION AND CURETTAGE OF UTERUS    . ESOPHAGOGASTRODUODENOSCOPY (EGD) WITH PROPOFOL N/A 07/06/2018   Procedure: ESOPHAGOGASTRODUODENOSCOPY (EGD) WITH PROPOFOL;  Surgeon: Clarene Essex, MD;  Location: [place];  Service: Endoscopy;  Laterality: N/A;  . EYE SURGERY Bilateral 12/14   cataracts  . HOT HEMOSTASIS N/A 07/06/2018   Procedure: HOT HEMOSTASIS (ARGON PLASMA COAGULATION/BICAP);  Surgeon: Clarene Essex, MD;  Location: [place];  Service: Endoscopy;  Laterality: N/A;  . MAXIMUM  ACCESS (MAS)POSTERIOR LUMBAR INTERBODY FUSION (PLIF) 2 LEVEL N/A 09/22/2013   Procedure: FOR MAXIMUM ACCESS  POSTERIOR LUMBAR INTERBODY FUSION LUMBAR THREE-FOUR FOUR-FIVE;  Surgeon: Eustace Moore, MD;  Location: [place] NEURO ORS;  Service: Neurosurgery;  Laterality: N/A;  . OOPHORECTOMY     wedge section not rem  . 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 REPLACEMENT TOTAL KNEE    . TEE WITHOUT CARDIOVERSION N/A 07/07/2018   Procedure: TRANSESOPHAGEAL ECHOCARDIOGRAM (TEE);  Surgeon: [name], MD;  Location: [place];  Service: Cardiovascular;   Laterality: N/A;  . TONSILLECTOMY    . TUBAL LIGATION             Current Outpatient Medications  Medication Sig Dispense Refill  . acetaminophen (TYLENOL) 500 MG tablet Take 500 mg by mouth every 6 (six) hours as needed for mild pain. For back pain    . amiodarone (PACERONE) 200 MG tablet Take 1 tablet (200 mg total) by mouth 2 (two) times daily. 60 tablet 0  . Biotin 10 MG TABS Take 10 mg by mouth 2 (two) times daily.    . Calcium Carbonate-Vitamin D (CALCIUM 600 + D PO) Take 1 tablet by mouth daily.     Marland Kitchen diltiazem (CARDIZEM CD) 240 MG 24 hr capsule Take 1 capsule (240 mg total) by mouth every 12 (twelve) hours. 60 capsule 11  . glimepiride (AMARYL) 4 MG tablet Take 2 mg by mouth daily.   3  . metFORMIN (GLUCOPHAGE-XR) 500 MG 24 hr tablet Take 1 tablet by mouth 2 (two) times daily.  3  . Multiple Vitamin (MULITIVITAMIN WITH MINERALS) TABS Take 1 tablet by mouth daily.    . pantoprazole (PROTONIX) 40 MG tablet Take 1 tablet (40 mg total) by mouth daily. 90 tablet 3  . warfarin (COUMADIN) 4 MG tablet Take 1 tablet (4 mg total) by mouth daily at 6 PM. (5 mg  Thursday. 4 mg all the other days) 30 tablet 0   No current facility-administered medications for this visit.     Allergies:   Femara [letrozole]; Gemfibrozil; Hydrocodone-acetaminophen; Letrozole; Metoprolol; Other; Tamoxifen; Tetracycline; Tetracyclines & related; Toprol xl [metoprolol tartrate]; Verapamil; Vicodin [hydrocodone-acetaminophen]; and Aromasin [exemestane]   Social History:  The patient  reports that she quit smoking about 14 years ago. Her smoking use included cigarettes. She has a 5.00 pack-year smoking history. She has never used smokeless tobacco. She reports current alcohol use. She reports that she does not use drugs.   Family History:  The patient's  family history includes Anesthesia problems in her mother; Diabetes in her father and mother.    ROS:  Please see the history of present  illness.   All other systems are personally reviewed and negative.    PHYSICAL EXAM: VS:  BP 126/60   Pulse 74   Ht 5\' 5"  (1.651 m)   Wt 248 lb (112.5 kg)   SpO2 97%   BMI 41.27 kg/m  , BMI Body mass index is 41.27 kg/m. GEN: obese, in no acute distress  HEENT: normal  Neck: no JVD, carotid bruits, or masses Cardiac: RRR; 2/6 SEM LUSB which is mid peaking Respiratory:  clear to auscultation bilaterally, normal work of breathing GI: soft, nontender, nondistended, + BS MS: no deformity or atrophy  Skin: warm and dry  Neuro:  Strength and sensation are intact Psych: very anxious   ASSESSMENT AND PLAN:  1.  Persistent atrial fibrillation Continues to have intermittent afib despite amiodarone. chads2vasc score is 4.  She is  on coumadin.  Therapeutic strategies for afib including medicine and ablation were discussed in detail with the patient today. Risk, benefits, and alternatives to EP study and radiofrequency ablation for afib were also discussed in detail today. These risks include but are not limited to stroke, bleeding, vascular damage, tamponade, perforation, damage to the esophagus, lungs, and other structures, pulmonary vein stenosis, worsening renal function, and death. The patient understands these risk and wishes to proceed as long as INR <3 today and TEE is unrevealing.  Thompson Grayer MD, [facility] 09/21/2018 7:42 AM

## 2018-09-21 NOTE — Interval H&P Note (Signed)
History and Physical Interval Note:  09/21/2018 7:52 AM  Jenna Mcdonald  has presented today for surgery, with the diagnosis of ATRIAL FIBRILLATION  The various methods of treatment have been discussed with the patient and family. After consideration of risks, benefits and other options for treatment, the patient has consented to  Procedure(s): TRANSESOPHAGEAL ECHOCARDIOGRAM (TEE) (N/A) as a surgical intervention .  The patient's history has been reviewed, patient examined, no change in status, stable for surgery.  I have reviewed the patient's chart and labs.  Questions were answered to the patient's satisfaction.     Mertie Moores

## 2018-09-21 NOTE — Progress Notes (Addendum)
Site area: Right groin a 7, 8, and 9 french venous sheath was removed  Site Prior to Removal:  Level 0  Pressure Applied For 20 MINUTES    Bedrest Beginning at 1515p  Manual:   Yes.    Patient Status During Pull:  stable  Post Pull Groin Site:  Level 0  Post Pull Instructions Given:  Yes.    Post Pull Pulses Present:  Yes.    Dressing Applied:  Yes.    Comments:  VS remain stable.

## 2018-09-21 NOTE — CV Procedure (Signed)
    Transesophageal Echocardiogram Note  ANIQUA BRIERE [id number] 1945-09-09  Procedure: Transesophageal Echocardiogram Indications: atrial fib   Procedure Details Consent: Obtained Time Out: Verified patient identification, verified procedure, site/side was marked, verified correct patient position, special equipment/implants available, Radiology Safety Procedures followed,  medications/allergies/relevent history reviewed, required imaging and test results available.  Performed  Medications:  During this procedure the patient is administered a total of Versed 5  mg and Fentanyl 75  mcg  to achieve and maintain moderate conscious sedation.  The patient's heart rate, blood pressure, and oxygen saturation are monitored continuously during the procedure. The period of conscious sedation is 30  minutes, of which I was present face-to-face 100% of this time.  Left Ventrical:  Normal LV function   Mitral Valve: mild MR   Aortic Valve: normal 3 leaflet valve.  No AI,   Mild AS visually .   We did not measure gradient   Tricuspid Valve: mild TR   Pulmonic Valve: no PI   Left Atrium/ Left atrial appendage: large LAA,  No thrombi   Atrial septum: intact by color flow.     Aorta: normal    Complications: No apparent complications Patient did tolerate procedure well.   Thayer Headings, Brooke Bonito., MD, [facility] 09/21/2018, 9:40 AM

## 2018-09-21 NOTE — Anesthesia Procedure Notes (Signed)
Procedure Name: Intubation Date/Time: 09/21/2018 10:21 AM Performed by: Inda Coke, CRNA Pre-anesthesia Checklist: Patient identified, Emergency Drugs available, Suction available and Patient being monitored Patient Re-evaluated:Patient Re-evaluated prior to induction Oxygen Delivery Method: Circle system utilized Preoxygenation: Pre-oxygenation with 100% oxygen Induction Type: IV induction Ventilation: Two handed mask ventilation required Laryngoscope Size: Glidescope Grade View: Grade I Tube size: 7.0 mm Number of attempts: 1 Airway Equipment and Method: Rigid stylet Placement Confirmation: ETT inserted through vocal cords under direct vision,  positive ETCO2 and breath sounds checked- equal and bilateral Secured at: 21 cm Tube secured with: Tape Dental Injury: Teeth and Oropharynx as per pre-operative assessment  Difficulty Due To: Difficulty was anticipated Comments: Raymond Gurney, [place]

## 2018-09-21 NOTE — Progress Notes (Addendum)
  Echocardiogram Echocardiogram Transesophageal has been performed.  Jenna Mcdonald [name] 09/21/2018, 10:13 AM

## 2018-09-21 NOTE — Anesthesia Procedure Notes (Deleted)
Performed by: Demisha Nokes L, CRNA       

## 2018-09-21 NOTE — Anesthesia Preprocedure Evaluation (Addendum)
Anesthesia Evaluation  Patient identified by MRN, date of birth, ID band Patient awake    Reviewed: Allergy & Precautions, NPO status , Patient's Chart, lab work & pertinent test results  History of Anesthesia Complications (+) PONV and history of anesthetic complications  Airway Mallampati: III  TM Distance: >3 FB Neck ROM: Full    Dental  (+) Dental Advisory Given, Teeth Intact   Pulmonary sleep apnea and Continuous Positive Airway Pressure Ventilation , former smoker,    breath sounds clear to auscultation       Cardiovascular hypertension, (-) angina+ dysrhythmias Atrial Fibrillation + Valvular Problems/Murmurs AS  Rhythm:Irregular Rate:Normal + Systolic murmurs  '19 TTE - Moderate LVH. EF 60% to 65%. Grade 1 diastolic dysfunction. Mild AS. Mean gradient (S): 11 mm Hg. Valve area (VTI): 1.85 cm^2. Valve area (Vmean): 1.86 cm^2. Trivial MR. Moderately dilated LA. Mildly dilated RA. Trivial TR.  '19 Carotid US - 1-39% left ICAS, none in right ICAS   Neuro/Psych negative neurological ROS  negative psych ROS   GI/Hepatic Neg liver ROS, GERD: denies.  , IBS    Endo/Other  diabetes, Type 2, Oral Hypoglycemic AgentsMorbid obesity Empty sella syndrome   Renal/GU Renal InsufficiencyRenal disease     Musculoskeletal  (+) Arthritis , Osteoarthritis,    Abdominal   Peds  Hematology negative hematology ROS (+)   Anesthesia Other Findings   Reproductive/Obstetrics  Breast cancer                           Anesthesia Physical Anesthesia Plan  ASA: III  Anesthesia Plan: General   Post-op Pain Management:    Induction: Intravenous  PONV Risk Score and Plan: 4 or greater and Treatment may vary due to age or medical condition, Ondansetron and Dexamethasone  Airway Management Planned: Oral ETT and Video Laryngoscope Planned  Additional Equipment: None  Intra-op Plan:   Post-operative  Plan: Extubation in OR  Informed Consent: I have reviewed the patients History and Physical, chart, labs and discussed the procedure including the risks, benefits and alternatives for the proposed anesthesia with the patient or authorized representative who has indicated his/her understanding and acceptance.     Dental advisory given  Plan Discussed with: CRNA and Anesthesiologist  Anesthesia Plan Comments: ( Hx grade 3 view with Mac 3 in 2015, will utilize glidescope for this intubation )      Anesthesia Quick Evaluation

## 2018-09-21 NOTE — Transfer of Care (Signed)
Immediate Anesthesia Transfer of Care Note  Patient: Jenna Mcdonald  Procedure(s) Performed: ATRIAL FIBRILLATION ABLATION (N/A )  Patient Location: PACU and Cath Lab  Anesthesia Type:General  Level of Consciousness: awake and alert   Airway & Oxygen Therapy: Patient Spontanous Breathing and Patient connected to nasal cannula oxygen  Post-op Assessment: Report given to RN and Post -op Vital signs reviewed and stable  Post vital signs: Reviewed and stable  Last Vitals:  Vitals Value Taken Time  BP 158/53 09/21/2018  1:29 PM  Temp 36.4 C 09/21/2018  1:25 PM  Pulse 71 09/21/2018  1:30 PM  Resp 14 09/21/2018  1:30 PM  SpO2 98 % 09/21/2018  1:30 PM  Vitals shown include unvalidated device data.  Last Pain:  Vitals:   09/21/18 0920  TempSrc:   PainSc: 0-No pain         Complications: No apparent anesthesia complications

## 2018-09-21 NOTE — Discharge Summary (Addendum)
ELECTROPHYSIOLOGY PROCEDURE DISCHARGE SUMMARY    Patient ID: Jenna Mcdonald,  MRN: [id number], DOB/AGE: 1945-11-03 73 y.o.  Admit date: 09/21/2018 Discharge date: 09/22/2018  Primary Care Physician: Josetta Huddle, MD  Primary Cardiologist: Dr. Radford Pax Electrophysiologist: Thompson Grayer, MD  Primary Discharge Diagnosis:  1. Persistent Afib     CHA2DS2Vasc is 4, on warfarin, monitored and managed with the AFib clinic  Secondary Discharge Diagnosis:  1. CKD (III) 2, OSA w/BIPAP 3. Thallasemia 4. HTN 5. DM 6. Morbid obesity   Procedures This Admission:  1.  Electrophysiology study and radiofrequency catheter ablation on 09/21/2018 by Dr Thompson Grayer.   This study demonstrated   CONCLUSIONS: 1. Sinus rhythm upon presentation.   2. Intracardiac echo reveals a moderate sized left atrium with a common ostium to the left PVs.  The right PVs were noted to have separate ostia.  There was a very large inferior branch off of the right superior vein.  3. Successful electrical isolation and anatomical encircling of all four pulmonary veins with radiofrequency current.  A WACA approach was used.   There was a very small area of persistent conduction at the carina between the upper and lower branches within the right superior PV.  Due to its location within the vein, additional ablation was not performed in this location. 4. No early apparent complications.     Brief HPI: Jenna Mcdonald is a 73 y.o. female with a history of persistent atrial fibrillation.  She has failed medical therapy with flecainide and amiodarone. Risks, benefits, and alternatives to catheter ablation of atrial fibrillation were reviewed with the patient who wished to proceed.  The patient underwent TEE prior to the procedure which demonstrated normal LV function and no LAA thrombus.    Hospital Course:  The patient was admitted and underwent EPS/RFCA of atrial fibrillation with details as outlined above.  The  patient was monitored on telemetry overnight which demonstrated SR.  R groin was without complication on the day of discharge.  The patient feels well this morning, no CP or SOB, no site discomfort.  She was examined by Dr. Rayann Heman and considered to be stable for discharge.  Wound care and restrictions were reviewed with the patient.  The patient will be seen back by Roderic Palau, NP in 4 weeks and Dr Rayann Heman in 12 weeks for post ablation follow up.   INR today 2.07, to continue her home regime with coumadin clinic follow up in place    Physical Exam: Vitals:   09/21/18 1600 09/21/18 1700 09/21/18 2011 09/22/18 0500  BP: 137/75 (!) 142/94 136/61 (!) 163/50  Pulse: 75 79 80 87  Resp: (!) 21 19 16 18   Temp:  98 F (36.7 C) 98.2 F (36.8 C) 98.3 F (36.8 C)  TempSrc:   Oral Oral  SpO2: 97% 93% 93% 94%  Weight:    113.1 kg  Height:        GEN- The patient is well appearing, alert and oriented x 3 today.   HEENT: normocephalic, atraumatic; sclera clear, conjunctiva pink; hearing intact; oropharynx clear; neck supple  Lungs- CTA b/l, normal work of breathing.  No wheezes, rales, rhonchi Heart-  RRR, no murmurs, rubs or gallops  GI- soft, non-tender, non-distended Extremities- no clubbing, cyanosis, trace edema; chronic appearing skin changes, DP/PT pulses 2+ bilaterally, R groin without hematoma/bruit MS- no significant deformity or atrophy Skin- warm and dry, no rash or lesion Psych- euthymic mood, full affect Neuro- strength and sensation are intact  Labs:   Lab Results  Component Value Date   WBC 6.4 09/21/2018   HGB 9.7 (L) 09/21/2018   HCT 35.4 (L) 09/21/2018   MCV 61.1 (L) 09/21/2018   PLT 161 09/21/2018    Recent Labs  Lab 09/21/18 0746  NA 140  K 4.5  CL 104  CO2 23  BUN 22  CREATININE 1.09*  CALCIUM 9.4  GLUCOSE 120*     Discharge Medications:  Allergies as of 09/22/2018      Reactions   Metoprolol Shortness Of Breath, Diarrhea, Other (See Comments)    Fatigue   Femara [letrozole] Other (See Comments)   unknown   Gemfibrozil Other (See Comments)   Severe fatigue   Other Other (See Comments)   Numerous cancer drugs - unknown reaction   Tamoxifen Other (See Comments)   Bone and body aches   Tetracyclines & Related Other (See Comments)   Vaginal infections    Toprol Xl [metoprolol Tartrate] Other (See Comments)   Hair loss   Verapamil Other (See Comments)   Unknown   Vicodin [hydrocodone-acetaminophen] Nausea And Vomiting, Other (See Comments)   Patient reports terrible nausea; plain tylenol is ok with patient   Aromasin [exemestane] Other (See Comments)   Hair loss      Medication List    TAKE these medications   amiodarone 200 MG tablet Commonly known as:  PACERONE Take 1 tablet (200 mg total) by mouth daily. What changed:  when to take this   Biotin 10 MG Tabs Take 10 mg by mouth 2 (two) times daily.   CALCIUM 600 + D PO Take 1 tablet by mouth daily.   diltiazem 240 MG 24 hr capsule Commonly known as:  CARDIZEM CD Take 1 capsule (240 mg total) by mouth every 12 (twelve) hours.   diphenhydramine-acetaminophen 25-500 MG Tabs tablet Commonly known as:  TYLENOL PM Take 2 tablets by mouth at bedtime.   ferrous sulfate 325 (65 FE) MG tablet Take 325 mg by mouth daily with breakfast.   glimepiride 2 MG tablet Commonly known as:  AMARYL Take 2 mg by mouth daily with breakfast.   metFORMIN 500 MG 24 hr tablet Commonly known as:  GLUCOPHAGE-XR Take 500 mg by mouth 2 (two) times daily.   multivitamin with minerals Tabs tablet Take 1 tablet by mouth daily.   pantoprazole 40 MG tablet Commonly known as:  PROTONIX Take 1 tablet (40 mg total) by mouth daily.   SYSTANE 0.4-0.3 % Soln Generic drug:  Polyethyl Glycol-Propyl Glycol Place 1 drop into both eyes daily as needed (for dry eyes).   warfarin 4 MG tablet Commonly known as:  COUMADIN Take as directed. If you are unsure how to take this medication, talk to  your nurse or doctor. Original instructions:  Take 1 tablet (4 mg total) by mouth daily at 6 PM. (5 mg  Thursday. 4 mg all the other days) What changed:    how much to take  when to take this  additional instructions       Disposition: Home  Discharge Instructions    Diet - low sodium heart healthy   Complete by:  As directed    Increase activity slowly   Complete by:  As directed      Follow-up Information    [facility].   Specialty:  Cardiology Why:  10/19/2018 @ 10:00AM Contact information: [address] [id number] [place] [id number]  Thompson Grayer, MD Follow up.   Specialty:  Cardiology Why:  12/22/2018 @ 9:45AM Contact information: [address] [place] [address] [phone]        [facility].   Specialty:  Cardiology Why:  10/01/2018 @ 9:30AM, coumadin clinic Contact information: [address], [place] [address] [phone]          Duration of Discharge Encounter: Greater than 30 minutes including physician time.  SignedTommye Standard, PA-C 09/22/2018 9:23 AM   I have seen, examined the patient, and reviewed the above assessment and plan.  Changes to above are made where necessary.  On exam, RRR.  Doing well s/p ablation.  DC to home with routine wound care and follow-up.  Co Sign: Thompson Grayer, MD 09/22/2018

## 2018-09-21 NOTE — Anesthesia Postprocedure Evaluation (Signed)
Anesthesia Post Note  Patient: [name]  Procedure(s) Performed: ATRIAL FIBRILLATION ABLATION (N/A )     Patient location during evaluation: PACU Anesthesia Type: General Level of consciousness: awake and alert Pain management: pain level controlled Vital Signs Assessment: post-procedure vital signs reviewed and stable Respiratory status: spontaneous breathing, nonlabored ventilation and respiratory function stable Cardiovascular status: blood pressure returned to baseline and stable Postop Assessment: no apparent nausea or vomiting Anesthetic complications: no    Last Vitals:  Vitals:   09/21/18 1600 09/21/18 1700  BP: 137/75 (!) 142/94  Pulse: 75 79  Resp: (!) 21 19  Temp:  36.7 C  SpO2: 97% 93%    Last Pain:  Vitals:   09/21/18 1600  TempSrc:   PainSc: 0-No pain                 Audry Pili

## 2018-09-22 ENCOUNTER — Encounter (HOSPITAL_COMMUNITY): Payer: Self-pay | Admitting: Internal Medicine

## 2018-09-22 DIAGNOSIS — I4819 Other persistent atrial fibrillation: Secondary | ICD-10-CM

## 2018-09-22 DIAGNOSIS — E1122 Type 2 diabetes mellitus with diabetic chronic kidney disease: Secondary | ICD-10-CM | POA: Diagnosis not present

## 2018-09-22 DIAGNOSIS — N183 Chronic kidney disease, stage 3 (moderate): Secondary | ICD-10-CM | POA: Diagnosis not present

## 2018-09-22 DIAGNOSIS — I129 Hypertensive chronic kidney disease with stage 1 through stage 4 chronic kidney disease, or unspecified chronic kidney disease: Secondary | ICD-10-CM | POA: Diagnosis not present

## 2018-09-22 LAB — POCT ACTIVATED CLOTTING TIME
Activated Clotting Time: 241 seconds
Activated Clotting Time: 279 seconds
Activated Clotting Time: 323 seconds
Activated Clotting Time: 714 seconds

## 2018-09-22 LAB — PROTIME-INR
INR: 2.07
Prothrombin Time: 23 seconds — ABNORMAL HIGH (ref 11.4–15.2)

## 2018-09-22 MED ORDER — MAGNESIUM HYDROXIDE 400 MG/5ML PO SUSP
30.0000 mL | Freq: Every day | ORAL | Status: DC | PRN
  Administered 2018-09-22: 30 mL via ORAL
  Filled 2018-09-22: qty 30

## 2018-09-22 MED ORDER — SIMETHICONE 80 MG PO CHEW
80.0000 mg | CHEWABLE_TABLET | Freq: Four times a day (QID) | ORAL | Status: DC | PRN
  Administered 2018-09-22: 80 mg via ORAL
  Filled 2018-09-22: qty 1

## 2018-09-22 MED FILL — Bupivacaine HCl Preservative Free (PF) Inj 0.25%: INTRAMUSCULAR | Qty: 30 | Status: AC

## 2018-09-22 NOTE — Discharge Instructions (Signed)
Post procedure care instructions No driving for 4 days. No lifting over 5 lbs for 1 week. No vigorous or sexual activity for 1 week. You may return to work/regular activities on 09/28/2018. Keep procedure site clean & dry. If you notice increased pain, swelling, bleeding or pus, call/return!  You may shower, but no soaking baths/hot tubs/pools for 1 week.   You have an appointment set up with the [facility].  Multiple studies have shown that being followed by a dedicated atrial fibrillation clinic in addition to the standard care you receive from your other physicians improves health. We believe that enrollment in the atrial fibrillation clinic will allow [place] to better care for you.   The phone number to the [facility] is [phone]. The clinic is staffed Monday through Friday from 8:30am to 5pm.  Parking Directions: The clinic is located in the Heart and Vascular Building connected to [facility]. 1)From [address] turn on to [address] and go to the 3rd entrance  (Heart and Vascular entrance) on the right. 2)Look to the right for Heart &Vascular Parking Garage. 3)A code for the entrance is required please call the clinic to receive this.   4)Take the elevators to the 1st floor. Registration is in the room with the glass walls at the end of the hallway.  If you have any trouble parking or locating the clinic, please dont hesitate to call [phone].

## 2018-09-24 ENCOUNTER — Encounter (HOSPITAL_COMMUNITY): Payer: Self-pay | Admitting: Cardiovascular Disease

## 2018-10-01 ENCOUNTER — Ambulatory Visit (INDEPENDENT_AMBULATORY_CARE_PROVIDER_SITE_OTHER): Payer: Medicare Other | Admitting: Pharmacist

## 2018-10-01 DIAGNOSIS — Z5181 Encounter for therapeutic drug level monitoring: Secondary | ICD-10-CM | POA: Diagnosis not present

## 2018-10-01 DIAGNOSIS — I4891 Unspecified atrial fibrillation: Secondary | ICD-10-CM | POA: Diagnosis not present

## 2018-10-01 LAB — POCT INR: INR: 1.8 — AB (ref 2.0–3.0)

## 2018-10-01 NOTE — Patient Instructions (Signed)
Description   Take 6mg  today, then continue taking Warfarin 4mg  daily except 5mg  on Thursdays. Recheck INR in 2 weeks. Patient would like to follow back with PCP Dr Inda Merlin after this. Coumadin Clinic [phone] Main [phone]

## 2018-10-04 ENCOUNTER — Other Ambulatory Visit (HOSPITAL_COMMUNITY): Payer: Self-pay | Admitting: Internal Medicine

## 2018-10-19 ENCOUNTER — Ambulatory Visit (HOSPITAL_COMMUNITY)
Admission: RE | Admit: 2018-10-19 | Discharge: 2018-10-19 | Disposition: A | Discharge status: Home / Self Care | Payer: Medicare Other | Source: Ambulatory Visit | Attending: Nurse Practitioner | Admitting: Nurse Practitioner

## 2018-10-19 ENCOUNTER — Encounter (HOSPITAL_COMMUNITY): Payer: Self-pay | Admitting: Nurse Practitioner

## 2018-10-19 ENCOUNTER — Ambulatory Visit (INDEPENDENT_AMBULATORY_CARE_PROVIDER_SITE_OTHER): Payer: Medicare Other | Admitting: *Deleted

## 2018-10-19 VITALS — BP 136/70 | HR 69 | Ht 65.0 in | Wt 249.0 lb

## 2018-10-19 DIAGNOSIS — Z923 Personal history of irradiation: Secondary | ICD-10-CM | POA: Diagnosis not present

## 2018-10-19 DIAGNOSIS — Z7901 Long term (current) use of anticoagulants: Secondary | ICD-10-CM | POA: Diagnosis not present

## 2018-10-19 DIAGNOSIS — E559 Vitamin D deficiency, unspecified: Secondary | ICD-10-CM | POA: Diagnosis not present

## 2018-10-19 DIAGNOSIS — Z79899 Other long term (current) drug therapy: Secondary | ICD-10-CM | POA: Insufficient documentation

## 2018-10-19 DIAGNOSIS — G4700 Insomnia, unspecified: Secondary | ICD-10-CM | POA: Diagnosis not present

## 2018-10-19 DIAGNOSIS — G4733 Obstructive sleep apnea (adult) (pediatric): Secondary | ICD-10-CM | POA: Insufficient documentation

## 2018-10-19 DIAGNOSIS — I4891 Unspecified atrial fibrillation: Secondary | ICD-10-CM

## 2018-10-19 DIAGNOSIS — Z5181 Encounter for therapeutic drug level monitoring: Secondary | ICD-10-CM

## 2018-10-19 DIAGNOSIS — I10 Essential (primary) hypertension: Secondary | ICD-10-CM | POA: Diagnosis not present

## 2018-10-19 DIAGNOSIS — Z7984 Long term (current) use of oral hypoglycemic drugs: Secondary | ICD-10-CM | POA: Insufficient documentation

## 2018-10-19 DIAGNOSIS — Z853 Personal history of malignant neoplasm of breast: Secondary | ICD-10-CM | POA: Insufficient documentation

## 2018-10-19 DIAGNOSIS — Z833 Family history of diabetes mellitus: Secondary | ICD-10-CM | POA: Insufficient documentation

## 2018-10-19 DIAGNOSIS — I4819 Other persistent atrial fibrillation: Secondary | ICD-10-CM | POA: Insufficient documentation

## 2018-10-19 DIAGNOSIS — E119 Type 2 diabetes mellitus without complications: Secondary | ICD-10-CM | POA: Diagnosis not present

## 2018-10-19 DIAGNOSIS — E236 Other disorders of pituitary gland: Secondary | ICD-10-CM | POA: Insufficient documentation

## 2018-10-19 DIAGNOSIS — E7800 Pure hypercholesterolemia, unspecified: Secondary | ICD-10-CM | POA: Insufficient documentation

## 2018-10-19 DIAGNOSIS — D649 Anemia, unspecified: Secondary | ICD-10-CM | POA: Insufficient documentation

## 2018-10-19 DIAGNOSIS — Z87891 Personal history of nicotine dependence: Secondary | ICD-10-CM | POA: Diagnosis not present

## 2018-10-19 DIAGNOSIS — D563 Thalassemia minor: Secondary | ICD-10-CM | POA: Diagnosis not present

## 2018-10-19 DIAGNOSIS — K219 Gastro-esophageal reflux disease without esophagitis: Secondary | ICD-10-CM | POA: Insufficient documentation

## 2018-10-19 DIAGNOSIS — I6522 Occlusion and stenosis of left carotid artery: Secondary | ICD-10-CM | POA: Diagnosis not present

## 2018-10-19 LAB — POCT INR: INR: 2 (ref 2.0–3.0)

## 2018-10-19 NOTE — Progress Notes (Signed)
Primary Care Physician: Josetta Huddle, MD Referring Physician: [facility] f/u EP:Dr. Allred Cardiologist: Dr.Turner   Jenna Mcdonald is a 73 y.o. female with a h/o persistent afib that was   hospitalized 07/20/18  for failure of flecainide to maintain  SR. She was started on amiodarone. Dr. Rayann Heman consulted and thought pt may be an ablation candidate.  She is on warfarin.  F/u in fib clinic, 10/19/18. She is s/p ablation x one month. She reports that she has done exceptionally well, no swallowing issues or groin concerns. She has not noted any afib. She is having however, some GI issues that she thinks may be from amiodarone and is asking to stop drug. She is also interested in the [facility] exercise program and will pass her name along to [name], to be considered for next start up exercise group. She remains on warfarin.   Today, she denies symptoms of palpitations, chest pain, shortness of breath, orthopnea, PND, lower extremity edema, dizziness, presyncope, syncope, or neurologic sequela. The patient is tolerating medications without difficulties and is otherwise without complaint today.   Past Medical History:  Diagnosis Date  . Anemia   . Aortic stenosis    mild by echo 08/2016, not mentioned in 06/2018 TEE  . Breast CA ([place])    left  . Carotid stenosis    1-39% left  . Degenerative disc disease, lumbar    of the knees  . Diabetes mellitus   . 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[place])   . Fatigue    Severe secondary to to gemfibrozil  . Gastroesophageal reflux disease    denies  . GI bleeding    a. 06/2018: EGD showed multiple nonbleeding duodenal ulcers. 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  . Hammertoe    left second toe  . Hypercholesteremia   . Hypertension   . Insomnia   . Irritable bowel   . Low back pain   . Metabolic syndrome   . Mild mitral regurgitation 06/2018  . Morbid obesity ([place])   . OSA on CPAP \   bipap  . Osteoarthritis   . Osteopenia   . Persistent atrial fibrillation   . Personal history of radiation therapy   . Pneumonia   . PONV (postoperative nausea and vomiting)   . Sigmoid diverticulosis   . Thalassemia minor   . Thalassemia minor   . Vitamin D deficiency    Past Surgical History:  Procedure Laterality Date  . ATRIAL FIBRILLATION ABLATION N/A 09/21/2018   Procedure: ATRIAL FIBRILLATION ABLATION;  Surgeon: Thompson Grayer, MD;  Location: [place] CV LAB;  Service: Cardiovascular;  Laterality: N/A;  . BIOPSY  07/06/2018   Procedure: BIOPSY;  Surgeon: Clarene Essex, MD;  Location: [place];  Service: Endoscopy;;  . BREAST LUMPECTOMY Left 05/28/2009  . BREAST SURGERY Left 10   lumpectomy  . CARDIOVERSION N/A 07/07/2018   Procedure: CARDIOVERSION;  Surgeon: [name], MD;  Location: [facility] ENDOSCOPY;  Service: Cardiovascular;  Laterality: N/A;  . CARDIOVERSION N/A 07/14/2018   Procedure: CARDIOVERSION;  Surgeon: Larey Dresser, MD;  Location: [facility] ENDOSCOPY;  Service: Cardiovascular;  Laterality: N/A;  . CESAREAN SECTION    . COLONOSCOPY WITH PROPOFOL N/A 07/06/2018   Procedure: COLONOSCOPY WITH PROPOFOL;  Surgeon: Clarene Essex, MD;  Location: [place];  Service: Endoscopy;  Laterality: N/A;  . CYSTECTOMY  70   pilonidal   . DILATION AND CURETTAGE OF UTERUS    . ESOPHAGOGASTRODUODENOSCOPY (EGD) WITH PROPOFOL N/A  07/06/2018   Procedure: ESOPHAGOGASTRODUODENOSCOPY (EGD) WITH PROPOFOL;  Surgeon: Clarene Essex, MD;  Location: [place];  Service: Endoscopy;  Laterality: N/A;  . EYE SURGERY Bilateral 12/14   cataracts  . HOT HEMOSTASIS N/A 07/06/2018   Procedure: HOT HEMOSTASIS (ARGON PLASMA COAGULATION/BICAP);  Surgeon: Clarene Essex, MD;  Location: [place];  Service: Endoscopy;  Laterality: N/A;  . MAXIMUM ACCESS (MAS)POSTERIOR LUMBAR INTERBODY FUSION (PLIF) 2 LEVEL N/A 09/22/2013   Procedure: FOR MAXIMUM ACCESS  POSTERIOR LUMBAR INTERBODY FUSION LUMBAR  THREE-FOUR FOUR-FIVE;  Surgeon: Eustace Moore, MD;  Location: [place] NEURO ORS;  Service: Neurosurgery;  Laterality: N/A;  . OOPHORECTOMY     wedge section not rem  . 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 REPLACEMENT TOTAL KNEE    . TEE WITHOUT CARDIOVERSION N/A 07/07/2018   Procedure: TRANSESOPHAGEAL ECHOCARDIOGRAM (TEE);  Surgeon: [name], MD;  Location: [place];  Service: Cardiovascular;  Laterality: N/A;  . TEE WITHOUT CARDIOVERSION N/A 09/21/2018   Procedure: TRANSESOPHAGEAL ECHOCARDIOGRAM (TEE);  Surgeon: Acie Fredrickson Wonda Cheng, MD;  Location: [facility] ENDOSCOPY;  Service: Cardiovascular;  Laterality: N/A;  . TONSILLECTOMY    . TUBAL LIGATION      Current Outpatient Medications  Medication Sig Dispense Refill  . amiodarone (PACERONE) 200 MG tablet Take 1 tablet (200 mg total) by mouth daily. (Patient taking differently: Take 200 mg by mouth at bedtime. ) 90 tablet 3  . Biotin 10 MG TABS Take 10 mg by mouth 2 (two) times daily.    . Calcium Carbonate-Vitamin D (CALCIUM 600 + D PO) Take 1 tablet by mouth daily.     Marland Kitchen diltiazem (CARDIZEM CD) 240 MG 24 hr capsule Take 1 capsule (240 mg total) by mouth every 12 (twelve) hours. 60 capsule 11  . diphenhydramine-acetaminophen (TYLENOL PM) 25-500 MG TABS tablet Take 2 tablets by mouth at bedtime.    . ferrous sulfate 325 (65 FE) MG tablet Take 325 mg by mouth daily with breakfast.    . glimepiride (AMARYL) 2 MG tablet Take 2 mg by mouth daily with breakfast.   3  . metFORMIN (GLUCOPHAGE-XR) 500 MG 24 hr tablet Take 500 mg by mouth 2 (two) times daily.   3  . Multiple Vitamin (MULITIVITAMIN WITH MINERALS) TABS Take 1 tablet by mouth daily.    Vladimir Faster Glycol-Propyl Glycol (SYSTANE) 0.4-0.3 % SOLN Place 1 drop into both eyes daily as needed (for dry eyes).    . warfarin (COUMADIN) 4 MG tablet Take 1 tablet (4 mg total) by mouth daily at 6 PM. (5 mg  Thursday. 4 mg all the other days) (Patient taking differently: Take 4-5 mg by mouth See admin  instructions. Take 5 mg by mouth daily on Thursday. Take 4 mg by mouth daily on all other days.) 90 tablet 0   No current facility-administered medications for this encounter.     Allergies  Allergen Reactions  . Metoprolol Shortness Of Breath, Diarrhea and Other (See Comments)    Fatigue   . Femara [Letrozole] Other (See Comments)    unknown  . Gemfibrozil Other (See Comments)    Severe fatigue  . Other Other (See Comments)    Numerous cancer drugs - unknown reaction  . Tamoxifen Other (See Comments)    Bone and body aches   . Tetracyclines & Related Other (See Comments)    Vaginal infections    . Toprol Xl [Metoprolol Tartrate] Other (See Comments)    Hair loss  . Verapamil Other (See Comments)  Unknown  . Vicodin [Hydrocodone-Acetaminophen] Nausea And Vomiting and Other (See Comments)    Patient reports terrible nausea; plain tylenol is ok with patient  . Aromasin [Exemestane] Other (See Comments)    Hair loss     Social History   Socioeconomic History  . Marital status: Single    Spouse name: Not on file  . Number of children: Not on file  . Years of education: Not on file  . Highest education level: Not on file  Occupational History  . Not on file  Social Needs  . Financial resource strain: Not on file  . Food insecurity:    Worry: Not on file    Inability: Not on file  . Transportation needs:    Medical: Not on file    Non-medical: Not on file  Tobacco Use  . Smoking status: Former Smoker    Packs/day: 0.50    Years: 10.00    Pack years: 5.00    Types: Cigarettes    Last attempt to quit: 11/12/2003    Years since quitting: 14.9  . Smokeless tobacco: Never Used  Substance and Sexual Activity  . Alcohol use: Yes    Comment: occasionally  . Drug use: No  . Sexual activity: Never    Birth control/protection: Post-menopausal  Lifestyle  . Physical activity:    Days per week: Not on file    Minutes per session: Not on file  . Stress: Not on file    Relationships  . Social connections:    Talks on phone: Not on file    Gets together: Not on file    Attends religious service: Not on file    Active member of club or organization: Not on file    Attends meetings of clubs or organizations: Not on file    Relationship status: Not on file  . Intimate partner violence:    Fear of current or ex partner: Not on file    Emotionally abused: Not on file    Physically abused: Not on file    Forced sexual activity: Not on file  Other Topics Concern  . Not on file  Social History Narrative  . Not on file    Family History  Problem Relation Age of Onset  . Anesthesia problems Mother   . Diabetes Mother   . Diabetes Father     ROS- All systems are reviewed and negative except as per the HPI above  Physical Exam: Vitals:   10/19/18 1022  BP: 136/70  Pulse: 69  Weight: 112.9 kg  Height: 5\' 5"  (1.651 m)   Wt Readings from Last 3 Encounters:  10/19/18 112.9 kg  09/22/18 113.1 kg  08/09/18 112.5 kg    Labs: Lab Results  Component Value Date   NA 140 09/21/2018   K 4.5 09/21/2018   CL 104 09/21/2018   CO2 23 09/21/2018   GLUCOSE 120 (H) 09/21/2018   BUN 22 09/21/2018   CREATININE 1.09 (H) 09/21/2018   CALCIUM 9.4 09/21/2018   MG 1.8 07/21/2018   Lab Results  Component Value Date   INR 2.0 10/19/2018   Lab Results  Component Value Date   CHOL  05/14/2008    146        ATP III CLASSIFICATION:  <200     mg/dL   Desirable  200-239  mg/dL   Borderline High  >=240    mg/dL   High   HDL 27 (L) 05/14/2008   [place]  05/14/2008  98        Total Cholesterol/HDL:CHD Risk Coronary Heart Disease Risk Table                     Men   Women  1/2 Average Risk   3.4   3.3   TRIG 105 05/14/2008     GEN- The patient is well appearing, alert and oriented x 3 today.   Head- normocephalic, atraumatic Eyes-  Sclera clear, conjunctiva pink Ears- hearing intact Oropharynx- clear Neck- supple, no JVP Lymph- no cervical  lymphadenopathy Lungs- Clear to ausculation bilaterally, normal work of breathing Heart-  regular rate and rhythm, no murmurs, rubs or gallops, PMI not laterally displaced GI- soft, NT, ND, + BS Extremities- no clubbing, cyanosis, or edema MS- no significant deformity or atrophy Skin- no rash or lesion Psych- euthymic mood, full affect Neuro- strength and sensation are intact  EKG- NSR at 69 bpm, Pr int 150 ms, qrs int 80 ms, qtc 432 ms Echo-Study Conclusions  - Left ventricle: The cavity size was normal. Wall thickness was   increased in a pattern of moderate LVH. Systolic function was   normal. The estimated ejection fraction was in the range of 60%   to 65%. Wall motion was normal; there were no regional wall   motion abnormalities. Doppler parameters are consistent with   abnormal left ventricular relaxation (grade 1 diastolic   dysfunction). The E/e&' ratio is between 8-15, suggesting   indeterminate LV filling pressure, - Aortic valve: Mildly calcified leaflets. Mild stenosis, Mean   gradient (S): 11 mm Hg. Valve area (VTI): 1.85 cm^2. Valve area   (Vmean): 1.86 cm^2. - Mitral valve: Calcified annulus. Mildly thickened leaflets .   There was trivial regurgitation. - Left atrium: Moderately dilated. 40 mm, volume 81.1 - Right atrium: The atrium was mildly dilated. - Atrial septum: No defect or patent foramen ovale was identified. - Tricuspid valve: There was trivial regurgitation. - Pulmonary arteries: PA peak pressure: 25 mm Hg (S). - Inferior vena cava: The IVC is >2.1 cm, but does not collapse   >50%, suggesting an elevated RA pressure of 8 mmHg.  Impressions:  - Compared to a recent study, the LVEF is higher at 60-65%. There   is mild aortic stenosis.    Assessment and Plan: 1. Persistent afib S/p ablation x one month She has enjoyed NSR since the procedure No adverse effects from the procedure She wishes to stop amiodarone for GI issues that she is  having Checked with Dr. Rayann Heman and he is OK to stop drug Continue Cardizem 240 mg daily, she would also like to stop this drug, can further discuss with Dr. Rayann Heman on 3 month f/u I will let the coumadin clinic to know that she is coming off amiodarone so her INR's can be checked more frequently in the transition I have referred her to [place] supervised 3 month exercise program, she is very excited to participate. I think the next group will start in March   F/u with Dr. Rayann Heman 4/29, Dr. Radford Pax 4/21 Afib clinic as needed  Geroge Baseman. Josecarlos Harriott, [facility] [address] [place], [place] [address] [phone]

## 2018-10-19 NOTE — Patient Instructions (Signed)
Description   Take 5mg  today, then start taking Warfarin 4mg  daily except 5mg  on Tuesdays and Thursdays. Recheck INR in 2 weeks. Patient would like to follow back with PCP Dr Inda Merlin after this. Coumadin Clinic [phone] Main [phone]

## 2018-10-20 ENCOUNTER — Other Ambulatory Visit: Payer: Self-pay

## 2018-10-20 MED ORDER — WARFARIN SODIUM 5 MG PO TABS: ORAL_TABLET | ORAL | 0 refills | Status: DC

## 2018-10-20 MED ORDER — WARFARIN SODIUM 4 MG PO TABS: ORAL_TABLET | ORAL | 0 refills | Status: DC

## 2018-10-20 NOTE — Telephone Encounter (Addendum)
Called pt to confirm that she need her 4mg  & 5mg  tablet refilled. Refills sent.

## 2018-11-03 ENCOUNTER — Other Ambulatory Visit: Payer: Self-pay

## 2018-11-03 ENCOUNTER — Ambulatory Visit (INDEPENDENT_AMBULATORY_CARE_PROVIDER_SITE_OTHER): Payer: Medicare Other | Admitting: Pharmacist

## 2018-11-03 DIAGNOSIS — Z5181 Encounter for therapeutic drug level monitoring: Secondary | ICD-10-CM

## 2018-11-03 DIAGNOSIS — I4891 Unspecified atrial fibrillation: Secondary | ICD-10-CM | POA: Diagnosis not present

## 2018-11-03 LAB — POCT INR: INR: 2.4 (ref 2.0–3.0)

## 2018-11-03 NOTE — Patient Instructions (Signed)
Description   Continue taking Warfarin 4mg  daily except 5mg  on Tuesdays and Thursdays. Recheck INR in 4 weeks. Patient would like to follow back with PCP Dr Inda Merlin after this. Coumadin Clinic [phone] Main [phone]

## 2018-11-10 ENCOUNTER — Other Ambulatory Visit (HOSPITAL_COMMUNITY): Payer: Self-pay | Admitting: Internal Medicine

## 2018-11-10 ENCOUNTER — Telehealth: Payer: Self-pay

## 2018-11-10 NOTE — Telephone Encounter (Signed)
Spoke to Jenna Mcdonald about the temporary closing of the [facility] and postponement of the PREP until it reopens.

## 2018-11-29 ENCOUNTER — Telehealth: Payer: Self-pay | Admitting: Pharmacist

## 2018-11-29 ENCOUNTER — Telehealth: Payer: Self-pay | Admitting: Cardiology

## 2018-11-29 NOTE — Telephone Encounter (Signed)
New Message    Pt has a coumadin appt scheduled and she doesn't know if she should keep it   Please call

## 2018-11-29 NOTE — Telephone Encounter (Signed)

## 2018-11-29 NOTE — Telephone Encounter (Signed)
Spoke with patient about appt for warfarin and explained drive up. Also discussed DOAC and patient adamantly declines as she states warfarin has worked well for her.   She also asks about Dr. Radford Pax appt and Dr. Rayann Heman appt and states that she does not need to see Dr. Radford Pax. Advised that these appts likely to be changed to virtual appts, but someone from the office will call and arrange for her as we move closer to appt. She states understanding.

## 2018-11-29 NOTE — Telephone Encounter (Signed)
New Message    Pt is calling because she would like for a nurse to talk to her about her upcomming appts  Please call

## 2018-11-30 NOTE — Telephone Encounter (Signed)
Sent mychart message

## 2018-12-01 ENCOUNTER — Ambulatory Visit (INDEPENDENT_AMBULATORY_CARE_PROVIDER_SITE_OTHER): Payer: Medicare Other | Admitting: Pharmacist

## 2018-12-01 ENCOUNTER — Other Ambulatory Visit: Payer: Self-pay

## 2018-12-01 DIAGNOSIS — I4891 Unspecified atrial fibrillation: Secondary | ICD-10-CM

## 2018-12-01 DIAGNOSIS — Z5181 Encounter for therapeutic drug level monitoring: Secondary | ICD-10-CM

## 2018-12-01 LAB — POCT INR: INR: 1.5 — AB (ref 2.0–3.0)

## 2018-12-13 ENCOUNTER — Telehealth: Payer: Self-pay

## 2018-12-13 NOTE — Telephone Encounter (Signed)
Pt called to say Adv 'd home stated Dr Radford Pax has access to pt's machine.  They are faxing it again and sending digitally again.  Pt would like a call back to make sure the information is received.  Before her appointment.

## 2018-12-13 NOTE — Telephone Encounter (Signed)

## 2018-12-13 NOTE — Telephone Encounter (Signed)
Patient is going to call [place] regarding the Download to her device..  She is going; to call me back  Regarding the appointment.-

## 2018-12-14 ENCOUNTER — Encounter: Payer: Self-pay | Admitting: Cardiology

## 2018-12-14 ENCOUNTER — Encounter: Payer: Medicare Other | Admitting: Cardiology

## 2018-12-14 ENCOUNTER — Other Ambulatory Visit: Payer: Self-pay

## 2018-12-14 NOTE — Telephone Encounter (Signed)
Duplicate encounter

## 2018-12-14 NOTE — Telephone Encounter (Signed)
Follow up:   Patient calling concerning her appt she has this morning Patient would like for some one to call her.

## 2018-12-14 NOTE — Telephone Encounter (Signed)
Called patient to start virtual office visit and she stated after we got through her medications and allergies that she doesn't think the virtual visit is a good idea. She stated she wants to reschedule for later time when she can have an in person office visit.

## 2018-12-14 NOTE — Telephone Encounter (Signed)
Virtual Visit Pre-Appointment Phone Call  Steps For Call:  1. Confirm consent - "In the setting of the current Covid19 crisis, you are scheduled for a phone visit with your provider on 12/14/18 at 9:20 am.  Just as we do with many in-office visits, in order for you to participate in this visit, we must obtain consent.  If you'd like, I can send this to your mychart (if signed up) or email for you to review.  Otherwise, I can obtain your verbal consent now.  All virtual visits are billed to your insurance company just like a normal visit would be.  By agreeing to a virtual visit, we'd like you to understand that the technology does not allow for your provider to perform an examination, and thus may limit your provider's ability to fully assess your condition. If your provider identifies any concerns that need to be evaluated in person, we will make arrangements to do so.  Finally, though the technology is pretty good, we cannot assure that it will always work on either your or our end, and in the setting of a video visit, we may have to convert it to a phone-only visit.  In either situation, we cannot ensure that we have a secure connection.  Are you willing to proceed?" STAFF: Did the patient verbally acknowledge consent to telehealth visit? Document YES/NO here: YES  2. Confirm the BEST phone number to call the day of the visit by including in appointment notes  3. Give patient instructions for MyChart download to smartphone OR [email or web address] as below if video visit (depending on what platform provider is using)  4. Confirm that appointment type is correct in Epic appointment notes (VIDEO vs PHONE)  5. Advise patient to be prepared with their blood pressure, heart rate, weight, any heart rhythm information, their current medicines, and a piece of paper and pen handy for any instructions they may receive the day of their visit  6. Inform patient they will receive a phone call 15 minutes prior to  their appointment time (may be from unknown caller ID) so they should be prepared to answer    TELEPHONE CALL NOTE  Jenna Mcdonald has been deemed a candidate for a follow-up tele-health visit to limit community exposure during the Covid-19 pandemic. I spoke with the patient via phone to ensure availability of phone/video source, confirm preferred email & phone number, and discuss instructions and expectations.  I reminded Jenna Mcdonald to be prepared with any vital sign and/or heart rhythm information that could potentially be obtained via home monitoring, at the time of her visit. I reminded Jenna Mcdonald to expect a phone call prior to her visit.  Sarina Ill, RN 12/14/2018 8:51 AM   INSTRUCTIONS FOR DOWNLOADING THE MYCHART APP TO SMARTPHONE  - The patient must first make sure to have activated MyChart and know their login information - If Apple, go to [employer] and type in MyChart in the search bar and download the app. If Android, ask patient to go to [employer] and type in [place] in the search bar and download the app. The app is free but as with any other app downloads, their phone may require them to verify saved payment information or Apple/Android password.  - The patient will need to then log into the app with their MyChart username and password, and select [facility] as their healthcare provider to link the account. When it is time for your visit,  go to the MyChart app, find appointments, and click Begin Video Visit. Be sure to Select Allow for your device to access the Microphone and Camera for your visit. You will then be connected, and your provider will be with you shortly.  **If they have any issues connecting, or need assistance please contact MyChart service desk [phone])**  **If using a computer, in order to ensure the best quality for their visit they will need to use either of the following Internet Browsers: [employer], or  Google Chrome**  IF USING DOXIMITY or DOXY.ME - The patient will receive a link just prior to their visit by text.     FULL LENGTH CONSENT FOR TELE-HEALTH VISIT   I hereby voluntarily request, consent and authorize [facility] and its employed or contracted physicians, physician assistants, nurse practitioners or other licensed health care professionals (the Practitioner), to provide me with telemedicine health care services (the "Services") as deemed necessary by the treating Practitioner. I acknowledge and consent to receive the Services by the Practitioner via telemedicine. I understand that the telemedicine visit will involve communicating with the Practitioner through live audiovisual communication technology and the disclosure of certain medical information by electronic transmission. I acknowledge that I have been given the opportunity to request an in-person assessment or other available alternative prior to the telemedicine visit and am voluntarily participating in the telemedicine visit.  I understand that I have the right to withhold or withdraw my consent to the use of telemedicine in the course of my care at any time, without affecting my right to future care or treatment, and that the Practitioner or I may terminate the telemedicine visit at any time. I understand that I have the right to inspect all information obtained and/or recorded in the course of the telemedicine visit and may receive copies of available information for a reasonable fee.  I understand that some of the potential risks of receiving the Services via telemedicine include:  Marland Kitchen Delay or interruption in medical evaluation due to technological equipment failure or disruption; . Information transmitted may not be sufficient (e.g. poor resolution of images) to allow for appropriate medical decision making by the Practitioner; and/or  . In rare instances, security protocols could fail, causing a breach of personal health  information.  Furthermore, I acknowledge that it is my responsibility to provide information about my medical history, conditions and care that is complete and accurate to the best of my ability. I acknowledge that Practitioner's advice, recommendations, and/or decision may be based on factors not within their control, such as incomplete or inaccurate data provided by me or distortions of diagnostic images or specimens that may result from electronic transmissions. I understand that the practice of medicine is not an exact science and that Practitioner makes no warranties or guarantees regarding treatment outcomes. I acknowledge that I will receive a copy of this consent concurrently upon execution via email to the email address I last provided but may also request a printed copy by calling the office of [facility].    I understand that my insurance will be billed for this visit.   I have read or had this consent read to me. . I understand the contents of this consent, which adequately explains the benefits and risks of the Services being provided via telemedicine.  . I have been provided ample opportunity to ask questions regarding this consent and the Services and have had my questions answered to my satisfaction. . I give my informed  consent for the services to be provided through the use of telemedicine in my medical care  By participating in this telemedicine visit I agree to the above.

## 2018-12-15 ENCOUNTER — Ambulatory Visit (INDEPENDENT_AMBULATORY_CARE_PROVIDER_SITE_OTHER): Payer: Medicare Other

## 2018-12-15 ENCOUNTER — Other Ambulatory Visit: Payer: Self-pay

## 2018-12-15 ENCOUNTER — Telehealth: Payer: Self-pay | Admitting: Internal Medicine

## 2018-12-15 ENCOUNTER — Other Ambulatory Visit: Payer: Self-pay | Admitting: Internal Medicine

## 2018-12-15 DIAGNOSIS — Z5181 Encounter for therapeutic drug level monitoring: Secondary | ICD-10-CM

## 2018-12-15 DIAGNOSIS — Z1231 Encounter for screening mammogram for malignant neoplasm of breast: Secondary | ICD-10-CM

## 2018-12-15 DIAGNOSIS — I4891 Unspecified atrial fibrillation: Secondary | ICD-10-CM | POA: Diagnosis not present

## 2018-12-15 LAB — POCT INR: INR: 1.4 — AB (ref 2.0–3.0)

## 2018-12-15 NOTE — Progress Notes (Signed)
This encounter was created in error - please disregard.

## 2018-12-15 NOTE — Telephone Encounter (Signed)
New Message   Pt is wondering if her upcoming appt is to be phone visit   Please call

## 2018-12-15 NOTE — Patient Instructions (Signed)
Description   Spoke with pt and instructed pt to take 6mg  today and tomorrow then resume with taking Warfarin 4mg  daily except 5mg  on Tuesdays and Thursdays. Please do not miss anymore Coumadin doses. Recheck INR in 2 weeks. Patient would like to follow back with PCP Dr Inda Merlin after this. Coumadin Clinic [phone] Main [phone]

## 2018-12-16 ENCOUNTER — Telehealth: Payer: Self-pay | Admitting: *Deleted

## 2018-12-16 NOTE — Telephone Encounter (Signed)
Informed patient of comopliance results and verbalized understanding was indicated. Patient is aware and agreeable to AHI being within range at 3.1. Patient is aware and agreeable to being in compliance with machine usage Patient is aware and agreeable to no change in current pressures

## 2018-12-16 NOTE — Telephone Encounter (Signed)
-----   Message from Sueanne Margarita, MD sent at 12/16/2018  2:01 PM EDT ----- Good AHI and compliance.  Continue current PAP settings.

## 2018-12-22 ENCOUNTER — Encounter: Payer: Self-pay | Admitting: Internal Medicine

## 2018-12-22 ENCOUNTER — Other Ambulatory Visit: Payer: Self-pay

## 2018-12-22 ENCOUNTER — Telehealth (INDEPENDENT_AMBULATORY_CARE_PROVIDER_SITE_OTHER): Payer: Medicare Other | Admitting: Internal Medicine

## 2018-12-22 VITALS — BP 140/70 | HR 73

## 2018-12-22 DIAGNOSIS — I10 Essential (primary) hypertension: Secondary | ICD-10-CM | POA: Diagnosis not present

## 2018-12-22 DIAGNOSIS — I4819 Other persistent atrial fibrillation: Secondary | ICD-10-CM | POA: Diagnosis not present

## 2018-12-22 DIAGNOSIS — G4733 Obstructive sleep apnea (adult) (pediatric): Secondary | ICD-10-CM | POA: Diagnosis not present

## 2018-12-22 MED ORDER — DILTIAZEM HCL ER COATED BEADS 120 MG PO CP24: 120.0000 mg | ORAL_CAPSULE | Freq: Every day | ORAL | 3 refills | Status: DC

## 2018-12-22 NOTE — Progress Notes (Signed)
Electrophysiology TeleHealth Note   Due to national recommendations of social distancing due to [place] 19, an audio  telehealth visit is felt to be most appropriate for this patient at this time.  Verbal consent was obtained from her today.  She does not have a smart phone and does not have a webcam.   Date:  12/22/2018   ID:  Jenna Mcdonald, DOB May 14, 1946, MRN [id number]  Location: patient's home  Provider location: [address], [place]  Evaluation Performed: Follow-up visit  PCP:  Josetta Huddle, MD  Cardiologist:  Fransico Him, MD Electrophysiologist:  Dr Rayann Heman  Chief Complaint:  afib  History of Present Illness:    Jenna Mcdonald is a 73 y.o. female who presents via audio conferencing for a telehealth visit today.  Since last being seen in our clinic, the patient reports doing very well.  She is pleased with the results of her ablation.  She denies procedure related complications.  Her energy is better.  Today, she denies symptoms of palpitations, chest pain, shortness of breath,  lower extremity edema, dizziness, presyncope, or syncope.  The patient is otherwise without complaint today.  The patient denies symptoms of fevers, chills, cough, or new SOB worrisome for COVID 19.  Past Medical History:  Diagnosis Date  . Anemia   . Aortic stenosis    mild by echo 08/2016, not mentioned in 06/2018 TEE  . Breast CA ([place])    left  . Carotid stenosis    1-39% left  . Degenerative disc disease, lumbar    of the knees  . Diabetes mellitus   . 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[place])   . Fatigue    Severe secondary to to gemfibrozil  . Gastroesophageal reflux disease    denies  . GI bleeding    a. 06/2018: EGD showed multiple nonbleeding duodenal ulcers. 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  . Hammertoe    left second toe  . Hypercholesteremia   . Hypertension   . Insomnia   . Irritable bowel   .  Low back pain   . Metabolic syndrome   . Mild mitral regurgitation 06/2018  . Morbid obesity ([place])   . OSA on CPAP \   bipap  . Osteoarthritis   . Osteopenia   . Persistent atrial fibrillation   . Personal history of radiation therapy   . Pneumonia   . PONV (postoperative nausea and vomiting)   . Sigmoid diverticulosis   . Thalassemia minor   . Thalassemia minor   . Vitamin D deficiency     Past Surgical History:  Procedure Laterality Date  . ATRIAL FIBRILLATION ABLATION N/A 09/21/2018   Procedure: ATRIAL FIBRILLATION ABLATION;  Surgeon: Thompson Grayer, MD;  Location: [place] [facility];  Service: Cardiovascular;  Laterality: N/A;  . BIOPSY  07/06/2018   Procedure: BIOPSY;  Surgeon: Clarene Essex, MD;  Location: [place];  Service: Endoscopy;;  . BREAST LUMPECTOMY Left 05/28/2009  . BREAST SURGERY Left 10   lumpectomy  . CARDIOVERSION N/A 07/07/2018   Procedure: CARDIOVERSION;  Surgeon: [name], MD;  Location[facility] ENDOSCOPY;  Service: Cardiovascular;  Laterality: N/A;  . CARDIOVERSION N/A 07/14/2018   Procedure: CARDIOVERSION;  Surgeon: Larey Dresser, MD;  Location: [facility] ENDOSCOPY;  Service: Cardiovascular;  Laterality: N/A;  . CESAREAN SECTION    . COLONOSCOPY WITH PROPOFOL N/A 07/06/2018   Procedure: COLONOSCOPY WITH PROPOFOL;  Surgeon: Clarene Essex, MD;  Location: [facility]  ENDOSCOPY;  Service: Endoscopy;  Laterality: N/A;  . CYSTECTOMY  70   pilonidal   . DILATION AND CURETTAGE OF UTERUS    . ESOPHAGOGASTRODUODENOSCOPY (EGD) WITH PROPOFOL N/A 07/06/2018   Procedure: ESOPHAGOGASTRODUODENOSCOPY (EGD) WITH PROPOFOL;  Surgeon: Clarene Essex, MD;  Location: [place];  Service: Endoscopy;  Laterality: N/A;  . EYE SURGERY Bilateral 12/14   cataracts  . HOT HEMOSTASIS N/A 07/06/2018   Procedure: HOT HEMOSTASIS (ARGON PLASMA COAGULATION/BICAP);  Surgeon: Clarene Essex, MD;  Location: [place];  Service: Endoscopy;  Laterality: N/A;  . MAXIMUM ACCESS (MAS)POSTERIOR LUMBAR  INTERBODY FUSION (PLIF) 2 LEVEL N/A 09/22/2013   Procedure: FOR MAXIMUM ACCESS  POSTERIOR LUMBAR INTERBODY FUSION LUMBAR THREE-FOUR FOUR-FIVE;  Surgeon: Eustace Moore, MD;  Location: [place] NEURO ORS;  Service: Neurosurgery;  Laterality: N/A;  . OOPHORECTOMY     wedge section not rem  . 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 REPLACEMENT TOTAL KNEE    . TEE WITHOUT CARDIOVERSION N/A 07/07/2018   Procedure: TRANSESOPHAGEAL ECHOCARDIOGRAM (TEE);  Surgeon: [name], MD;  Location: [place];  Service: Cardiovascular;  Laterality: N/A;  . TEE WITHOUT CARDIOVERSION N/A 09/21/2018   Procedure: TRANSESOPHAGEAL ECHOCARDIOGRAM (TEE);  Surgeon: Acie Fredrickson Wonda Cheng, MD;  Location: [facility] ENDOSCOPY;  Service: Cardiovascular;  Laterality: N/A;  . TONSILLECTOMY    . TUBAL LIGATION      Current Outpatient Medications  Medication Sig Dispense Refill  . Calcium Carbonate-Vitamin D (CALCIUM 600 + D PO) Take 1 tablet by mouth daily.     Marland Kitchen diltiazem (CARDIZEM CD) 240 MG 24 hr capsule Take 1 capsule (240 mg total) by mouth every 12 (twelve) hours. 60 capsule 11  . diphenhydramine-acetaminophen (TYLENOL PM) 25-500 MG TABS tablet Take 2 tablets by mouth at bedtime.    Marland Kitchen glimepiride (AMARYL) 2 MG tablet Take 2 mg by mouth daily with breakfast.   3  . Iron-FA-B Cmp-C-Biot-Probiotic (FUSION PLUS) CAPS Take 1 capsule by mouth daily.    . metFORMIN (GLUCOPHAGE-XR) 500 MG 24 hr tablet Take 500 mg by mouth 2 (two) times daily.   3  . warfarin (COUMADIN) 4 MG tablet Take 4mg  daily except 5mg  on Tuesday and Thursday or as directed by Coumadin Clinic. 80 tablet 0  . warfarin (COUMADIN) 5 MG tablet Take 5mg  on Tuesday and Thursday and 4mg  daily or as directed by Coumadin Clinic. 30 tablet 0   No current facility-administered medications for this visit.     Allergies:   Metoprolol; Femara [letrozole]; Gemfibrozil; Other; Tamoxifen; Tetracyclines & related; Toprol xl [metoprolol tartrate]; Verapamil; Vicodin  [hydrocodone-acetaminophen]; and Aromasin [exemestane]   Social History:  The patient  reports that she quit smoking about 15 years ago. Her smoking use included cigarettes. She has a 5.00 pack-year smoking history. She has never used smokeless tobacco. She reports current alcohol use. She reports that she does not use drugs.   Family History:  The patient's  family history includes Anesthesia problems in her mother; Diabetes in her father and mother.   ROS:  Please see the history of present illness.   All other systems are personally reviewed and negative.    Exam:    Vital Signs:  BP 140/70   Pulse 73   Well sounding   Labs/Other Tests and Data Reviewed:    Recent Labs: 07/12/2018: ALT 55 07/16/2018: B Natriuretic Peptide 108.0 07/21/2018: Magnesium 1.8; TSH 3.295 09/21/2018: BUN 22; Creatinine, Ser 1.09; Hemoglobin 9.7; Platelets 161; Potassium 4.5; Sodium 140   Wt Readings from Last  3 Encounters:  10/19/18 249 lb (112.9 kg)  09/22/18 249 lb 4.8 oz (113.1 kg)  08/09/18 248 lb (112.5 kg)     Other studies personally reviewed: Additional studies/ records that were reviewed today include: afib ablation procedure note, my prior notes  Review of the above records today demonstrates: as above Prior radiographs: cardiac CT 09/03/2018- calcium score is 45%   ASSESSMENT & PLAN:    1.  Persistent atrial fibrillation Doing great post ablation Wean diltiazem CD to 240mg  daily Once she uses up what she has, she can go to 120mg  daily (ordered today) Continue coumadin for chads2vasc score of 4  2. HTN Lifestyle modification is encouraged Will need to monitor while reducing diltiazem.  3. Morbid obesity Lifestyle modification discussed at length today  4. OSA I have advised compliance with CPAP.  She reports using it regularly Followed by Dr Radford Pax  5. COVID 19 screen The patient denies symptoms of COVID 19 at this time.  The importance of social distancing was discussed  today.  Follow-up:  3 months  AF clinic  Current medicines are reviewed at length with the patient today.   The patient does not have concerns regarding her medicines.  The following changes were made today:  none  Labs/ tests ordered today include:  No orders of the defined types were placed in this encounter.  Patient Risk:  after full review of this patients clinical status, I feel that they are at moderate risk at this time.  Today, I have spent 22 minutes with the patient with telehealth technology discussing afib .    Army Fossa, MD  12/22/2018 9:55 AM     [address] [place] [place] [id number] [phone] (office) [phone] (fax)

## 2018-12-24 ENCOUNTER — Telehealth: Payer: Self-pay

## 2018-12-24 NOTE — Telephone Encounter (Signed)
Unable to lmom due to no answering machine

## 2018-12-27 ENCOUNTER — Ambulatory Visit: Payer: Medicare Other | Admitting: Internal Medicine

## 2018-12-27 ENCOUNTER — Telehealth: Payer: Self-pay | Admitting: *Deleted

## 2018-12-27 NOTE — Telephone Encounter (Signed)
Informed patient of compliance results and verbalized understanding was indicated. Patient is aware and agreeable to AHI being within range at 3.1. Patient is aware and agreeable to being in compliance with machine usage Patient is aware and agreeable to no change in current pressures 

## 2018-12-27 NOTE — Telephone Encounter (Signed)
-----   Message from Sueanne Margarita, MD sent at 12/24/2018  3:19 PM EDT ----- Good AHI and compliance.  Continue current PAP settings.

## 2018-12-30 ENCOUNTER — Telehealth: Payer: Self-pay | Admitting: Pharmacist

## 2018-12-30 ENCOUNTER — Telehealth: Payer: Self-pay | Admitting: Cardiology

## 2018-12-30 NOTE — Telephone Encounter (Signed)
Ok to put her on for video visit next week in the am

## 2018-12-30 NOTE — Telephone Encounter (Signed)
Spoke with the patient, she wanted a visit with Dr. Radford Pax. She was scheduled on 01/13/19. She stated that she has had swelling in her feet recently. Her blood pressure 138/89, HR:78

## 2018-12-30 NOTE — Telephone Encounter (Signed)
1. Do you currently have a fever? No (yes = cancel and refer to pcp for e-visit) 2. Have you recently travelled on a cruise, internationally, or to [place], [place], [place], [place], [place], or [place], [place] [employer]) ? no (yes = cancel, stay home, monitor symptoms, and contact pcp or initiate e-visit if symptoms develop) 3. Have you been in contact with someone that is currently pending confirmation of Covid19 testing or has been confirmed to have the [place] virus?  No (yes = cancel, stay home, away from tested individual, monitor symptoms, and contact pcp or initiate e-visit if symptoms develop) 4. Are you currently experiencing fatigue or cough? no (yes = pt should be prepared to have a mask placed at the time of their visit).  Pt. Advised that we are restricting visitors at this time and anyone present in the vehicle should meet the above criteria as well. Advised that visit will be at curbside for finger stick ONLY and will receive call with instructions. Pt also advised to please bring own pen for signature of arrival document.

## 2018-12-30 NOTE — Telephone Encounter (Signed)
Ben I am not sure if pt is asking to see Dr. Radford Pax before 03/2019. Operator did not take sufficient message to find out which appt they are talking about. So I do not know if this is about Dr. Radford Pax appt or the A-fib clinic.

## 2018-12-30 NOTE — Telephone Encounter (Signed)
New message:   Patient calling to see if she can get a sooner appt. Please call patient.

## 2018-12-31 ENCOUNTER — Ambulatory Visit (INDEPENDENT_AMBULATORY_CARE_PROVIDER_SITE_OTHER): Payer: Medicare Other | Admitting: *Deleted

## 2018-12-31 ENCOUNTER — Other Ambulatory Visit: Payer: Self-pay

## 2018-12-31 DIAGNOSIS — I4891 Unspecified atrial fibrillation: Secondary | ICD-10-CM | POA: Diagnosis not present

## 2018-12-31 DIAGNOSIS — Z5181 Encounter for therapeutic drug level monitoring: Secondary | ICD-10-CM

## 2018-12-31 LAB — POCT INR
INR: 2.1 (ref 2.0–3.0)
INR: 2.1 (ref 2.0–3.0)

## 2018-12-31 NOTE — Patient Instructions (Signed)
Spoke with pt and instructed pt to take 6mg  today and tomorrow then resume with taking Warfarin 4mg  daily except 5mg  on Tuesdays and Thursdays. Please do not miss anymore Coumadin doses. Recheck INR in 3 weeks. Patient would like to follow back with PCP Dr Inda Merlin after this. Coumadin Clinic [phone] Main [phone]

## 2019-01-03 ENCOUNTER — Encounter: Payer: Self-pay | Admitting: Cardiology

## 2019-01-03 ENCOUNTER — Other Ambulatory Visit: Payer: Self-pay

## 2019-01-03 ENCOUNTER — Telehealth (INDEPENDENT_AMBULATORY_CARE_PROVIDER_SITE_OTHER): Payer: Medicare Other | Admitting: Cardiology

## 2019-01-03 VITALS — BP 136/52 | HR 69 | Ht 65.0 in

## 2019-01-03 DIAGNOSIS — E119 Type 2 diabetes mellitus without complications: Secondary | ICD-10-CM

## 2019-01-03 DIAGNOSIS — G4733 Obstructive sleep apnea (adult) (pediatric): Secondary | ICD-10-CM | POA: Diagnosis not present

## 2019-01-03 DIAGNOSIS — I4819 Other persistent atrial fibrillation: Secondary | ICD-10-CM

## 2019-01-03 DIAGNOSIS — I350 Nonrheumatic aortic (valve) stenosis: Secondary | ICD-10-CM | POA: Diagnosis not present

## 2019-01-03 DIAGNOSIS — I10 Essential (primary) hypertension: Secondary | ICD-10-CM | POA: Diagnosis not present

## 2019-01-03 DIAGNOSIS — Z7189 Other specified counseling: Secondary | ICD-10-CM

## 2019-01-03 NOTE — Progress Notes (Signed)
Virtual Visit via Telephone Note   This visit type was conducted due to national recommendations for restrictions regarding the COVID-19 Pandemic (e.g. social distancing) in an effort to limit this patient's exposure and mitigate transmission in our community.  Due to her co-morbid illnesses, this patient is at least at moderate risk for complications without adequate follow up.  This format is felt to be most appropriate for this patient at this time.  The patient did not have access to video technology/had technical difficulties with video requiring transitioning to audio format only (telephone).  All issues noted in this document were discussed and addressed.  No physical exam could be performed with this format.  Please refer to the patient's chart for her  consent to telehealth for [facility].   Evaluation Performed:  Follow-up visit  This visit type was conducted due to national recommendations for restrictions regarding the COVID-19 Pandemic (e.g. social distancing).  This format is felt to be most appropriate for this patient at this time.  All issues noted in this document were discussed and addressed.  No physical exam was performed (except for noted visual exam findings with Video Visits).  Please refer to the patient's chart (MyChart message for video visits and phone note for telephone visits) for the patient's consent to telehealth for [facility]  Date:  01/03/2019   ID:  Jenna Mcdonald, DOB [birth year], MRN [id number]  Patient Location:  Home  Provider location:   [place]  PCP:  Josetta Huddle, MD  Cardiologist:  Fransico Him, MD  Electrophysiologist:  Thompson Grayer, MD   Chief Complaint:  OSA  History of Present Illness:    Jenna Mcdonald is a 73 y.o. female who presents via audio/video conferencing for a telehealth visit today.    Jenna Mcdonald is a 73 y.o. female with a hx of OSA on BiPAP, HTN, PAF, mild AS by echo 1/2018and morbid obesity. She is  here today for followup and is doing well.  She denies any chest pain or pressure, SOB, DOE, PND, orthopnea, LE edema,  palpitations or syncope. She has had some dizzy spells in the am after taking her DM meds and thinks if it low BS.  She is compliant with her meds and is tolerating meds with no SE.  She is doing well with her CPAP device and thinks that She has gotten used to it.  She tolerates the mask and feels the pressure is adequate.  Since going on CPAP she feels rested in the am and has no significant daytime sleepiness.  She denies any significant mouth or nasal dryness or nasal congestion.  She does not think that he snores.     The patient does not have symptoms concerning for COVID-19 infection (fever, chills, cough, or new shortness of breath).    Prior CV studies:   The following studies were reviewed today:  PAP compliance download and 2D echo  Past Medical History:  Diagnosis Date  . Anemia   . Aortic stenosis    mild by echo 08/2016  . Breast CA ([place])    left  . Carotid stenosis    1-39% left  . Degenerative disc disease, lumbar    of the knees  . Diabetes mellitus   . 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[place])   . Fatigue    Severe secondary to to gemfibrozil  . Gastroesophageal reflux disease    denies  . GI bleeding    a. 06/2018: EGD showed multiple nonbleeding duodenal  ulcers. 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  . Hammertoe    left second toe  . Hypercholesteremia   . Hypertension   . Insomnia   . Irritable bowel   . Low back pain   . Metabolic syndrome   . Morbid obesity ([place])   . OSA on CPAP \   bipap  . Osteoarthritis   . Osteopenia   . Persistent atrial fibrillation    s/p afib ablation - Dr. Rayann Heman 08/2018  . Personal history of radiation therapy   . Pneumonia   . PONV (postoperative nausea and vomiting)   . Sigmoid diverticulosis   . Thalassemia minor   . Vitamin D deficiency    Past  Surgical History:  Procedure Laterality Date  . ATRIAL FIBRILLATION ABLATION N/A 09/21/2018   Procedure: ATRIAL FIBRILLATION ABLATION;  Surgeon: Thompson Grayer, MD;  Location: [place] [facility];  Service: Cardiovascular;  Laterality: N/A;  . BIOPSY  07/06/2018   Procedure: BIOPSY;  Surgeon: Clarene Essex, MD;  Location: [place];  Service: Endoscopy;;  . BREAST LUMPECTOMY Left 05/28/2009  . BREAST SURGERY Left 10   lumpectomy  . CARDIOVERSION N/A 07/07/2018   Procedure: CARDIOVERSION;  Surgeon: [name], MD;  Location: [facility] ENDOSCOPY;  Service: Cardiovascular;  Laterality: N/A;  . CARDIOVERSION N/A 07/14/2018   Procedure: CARDIOVERSION;  Surgeon: Larey Dresser, MD;  Location: [facility] ENDOSCOPY;  Service: Cardiovascular;  Laterality: N/A;  . CESAREAN SECTION    . COLONOSCOPY WITH PROPOFOL N/A 07/06/2018   Procedure: COLONOSCOPY WITH PROPOFOL;  Surgeon: Clarene Essex, MD;  Location: [place];  Service: Endoscopy;  Laterality: N/A;  . CYSTECTOMY  70   pilonidal   . DILATION AND CURETTAGE OF UTERUS    . ESOPHAGOGASTRODUODENOSCOPY (EGD) WITH PROPOFOL N/A 07/06/2018   Procedure: ESOPHAGOGASTRODUODENOSCOPY (EGD) WITH PROPOFOL;  Surgeon: Clarene Essex, MD;  Location: [place];  Service: Endoscopy;  Laterality: N/A;  . EYE SURGERY Bilateral 12/14   cataracts  . HOT HEMOSTASIS N/A 07/06/2018   Procedure: HOT HEMOSTASIS (ARGON PLASMA COAGULATION/BICAP);  Surgeon: Clarene Essex, MD;  Location: [place];  Service: Endoscopy;  Laterality: N/A;  . MAXIMUM ACCESS (MAS)POSTERIOR LUMBAR INTERBODY FUSION (PLIF) 2 LEVEL N/A 09/22/2013   Procedure: FOR MAXIMUM ACCESS  POSTERIOR LUMBAR INTERBODY FUSION LUMBAR THREE-FOUR FOUR-FIVE;  Surgeon: Eustace Moore, MD;  Location: [place] NEURO ORS;  Service: Neurosurgery;  Laterality: N/A;  . OOPHORECTOMY     wedge section not rem  . 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 REPLACEMENT TOTAL KNEE    . TEE WITHOUT CARDIOVERSION N/A 07/07/2018   Procedure:  TRANSESOPHAGEAL ECHOCARDIOGRAM (TEE);  Surgeon: [name], MD;  Location: [place];  Service: Cardiovascular;  Laterality: N/A;  . TEE WITHOUT CARDIOVERSION N/A 09/21/2018   Procedure: TRANSESOPHAGEAL ECHOCARDIOGRAM (TEE);  Surgeon: Acie Fredrickson Wonda Cheng, MD;  Location: [facility] ENDOSCOPY;  Service: Cardiovascular;  Laterality: N/A;  . TONSILLECTOMY    . TUBAL LIGATION       Current Meds  Medication Sig  . Calcium Carbonate-Vitamin D (CALCIUM 600 + D PO) Take 1 tablet by mouth Mcdonald.   Marland Kitchen diltiazem (CARDIZEM CD) 120 MG 24 hr capsule Take 1 capsule (120 mg total) by mouth Mcdonald. (Patient taking differently: Take 240 mg by mouth Mcdonald. )  . diphenhydramine-acetaminophen (TYLENOL PM) 25-500 MG TABS tablet Take 2 tablets by mouth as needed (sleep).   Marland Kitchen glimepiride (AMARYL) 2 MG tablet Take 2 mg by mouth Mcdonald with breakfast.   . Iron-FA-B Cmp-C-Biot-Probiotic (  FUSION PLUS) CAPS Take 1 capsule by mouth Mcdonald.  . metFORMIN (GLUCOPHAGE-XR) 500 MG 24 hr tablet Take 500 mg by mouth 2 (two) times Mcdonald.   Marland Kitchen warfarin (COUMADIN) 4 MG tablet Take 4mg  Mcdonald except 5mg  on Tuesday and Thursday or as directed by Coumadin Clinic.  Marland Kitchen warfarin (COUMADIN) 5 MG tablet Take 5mg  on Tuesday and Thursday and 4mg  Mcdonald or as directed by Coumadin Clinic.     Allergies:   Metoprolol; Femara [letrozole]; Gemfibrozil; Other; Tamoxifen; Tetracyclines & related; Toprol xl [metoprolol tartrate]; Verapamil; Vicodin [hydrocodone-acetaminophen]; and Aromasin [exemestane]   Social History   Tobacco Use  . Smoking status: Former Smoker    Packs/day: 0.50    Years: 10.00    Pack years: 5.00    Types: Cigarettes    Last attempt to quit: 11/12/2003    Years since quitting: 15.1  . Smokeless tobacco: Never Used  Substance Use Topics  . Alcohol use: Yes    Comment: occasionally  . Drug use: No     Family Hx: The patient's family history includes Anesthesia problems in her mother; Diabetes in her father and mother.  ROS:    Please see the history of present illness.     All other systems reviewed and are negative.   Labs/Other Tests and Data Reviewed:    Recent Labs: 07/12/2018: ALT 55 07/16/2018: B Natriuretic Peptide 108.0 07/21/2018: Magnesium 1.8; TSH 3.295 09/21/2018: BUN 22; Creatinine, Ser 1.09; Hemoglobin 9.7; Platelets 161; Potassium 4.5; Sodium 140   Recent Lipid Panel Lab Results  Component Value Date/Time   CHOL  05/14/2008 04:35 AM    146        ATP III CLASSIFICATION:  <200     mg/dL   Desirable  200-239  mg/dL   Borderline High  >=240    mg/dL   High   TRIG 105 05/14/2008 04:35 AM   HDL 27 (L) 05/14/2008 04:35 AM   CHOLHDL 5.4 05/14/2008 04:35 AM   LDLCALC  05/14/2008 04:35 AM    98        Total Cholesterol/HDL:CHD Risk Coronary Heart Disease Risk Table                     Men   Women  1/2 Average Risk   3.4   3.3    Wt Readings from Last 3 Encounters:  10/19/18 249 lb (112.9 kg)  09/22/18 249 lb 4.8 oz (113.1 kg)  08/09/18 248 lb (112.5 kg)     Objective:    Vital Signs:  BP (!) 136/52   Pulse 69   Ht 5\' 5"  (1.651 m)   BMI 41.44 kg/m     ASSESSMENT & PLAN:    1.  OSA - the patient is tolerating PAP therapy well without any problems. The PAP download was reviewed today and showed an AHI of 3.3/hr on 12/8 cm H2O with 100% compliance in using more than 4 hours nightly.  The patient has been using and benefiting from PAP use and will continue to benefit from therapy.   2.  Mild aortic stenosis - her last echo 08/2018 showed very mild aortic stenosis with mean AVG 30mmHg.    3.  Hypertension - Her BP is controlled at home.  She will continue on Cardizem CD 240mg  Mcdonald.   4.  PAF - this is followed by Dr. Rayann Heman.  She is s/p afib ablation in January and says that she feels like a new person and feels the  best she has felt in a long time.  She will continue on Cardizem and warfarin.  5.  Obesity - I have encouraged her to get into a routine exercise program and cut back  on carbs and portions. She has found an exercise program at [facility] that she has joined but currently is on hold due to [place] 19.  6.  Type 2 DM - followed by PCP - this is followed by her PCP.  SHe has had some problems with low BS recently and is going to call her GP today.  She will continue on metformin 500mg  BID and glimepiride.    7.  COVID-19 Education:The signs and symptoms of COVID-19 were discussed with the patient and how to seek care for testing (follow up with PCP or arrange E-visit).  The importance of social distancing was discussed today.  Patient Risk:   After full review of this patient's clinical status, I feel that they are at least moderate risk at this time.  Time:   Today, I have spent 20 minutes directly with the patient on telephone discussing medical problems including OSA, HTN, AS, PAF and obesity.  We also reviewed the symptoms of COVID 19 and the ways to protect against contracting the virus with telehealth technology.  I spent an additional 5 minutes reviewing patient's chart including 2D echo and PAP compliance download.  Medication Adjustments/Labs and Tests Ordered: Current medicines are reviewed at length with the patient today.  Concerns regarding medicines are outlined above.  Tests Ordered: No orders of the defined types were placed in this encounter.  Medication Changes: No orders of the defined types were placed in this encounter.   Disposition:  Follow up in 1 year(s)  Signed, Fransico Him, MD  01/03/2019 9:37 AM    [facility]

## 2019-01-03 NOTE — Patient Instructions (Signed)
Medication Instructions:   If you need a refill on your cardiac medications before your next appointment, please call your pharmacy.   Lab work:  If you have labs (blood work) drawn today and your tests are completely normal, you will receive your results only by: Marland Kitchen MyChart Message (if you have MyChart) OR . A paper copy in the mail If you have any lab test that is abnormal or we need to change your treatment, we will call you to review the results.  Testing/Procedures: None ordered today.  Follow-Up: At [facility], you and your health needs are our priority.  As part of our continuing mission to provide you with exceptional heart care, we have created designated Provider Care Teams.  These Care Teams include your primary Cardiologist (physician) and Advanced Practice Providers (APPs -  Physician Assistants and Nurse Practitioners) who all work together to provide you with the care you need, when you need it. You will need a follow up appointment in 1 years.  Please call our office 2 months in advance to schedule this appointment.  You may see Fransico Him, MD or one of the following Advanced Practice Providers on your designated Care Team:   [place], PA-C Melina Copa, PA-C . Ermalinda Barrios, PA-C

## 2019-01-10 ENCOUNTER — Other Ambulatory Visit: Payer: Self-pay | Admitting: Cardiology

## 2019-01-13 ENCOUNTER — Telehealth: Payer: Medicare Other | Admitting: Cardiology

## 2019-01-20 ENCOUNTER — Telehealth: Payer: Self-pay

## 2019-01-20 NOTE — Telephone Encounter (Signed)

## 2019-01-21 ENCOUNTER — Other Ambulatory Visit: Payer: Self-pay

## 2019-01-21 ENCOUNTER — Other Ambulatory Visit: Payer: Self-pay | Admitting: Pharmacist

## 2019-01-21 ENCOUNTER — Ambulatory Visit (INDEPENDENT_AMBULATORY_CARE_PROVIDER_SITE_OTHER): Payer: Medicare Other | Admitting: Pharmacist

## 2019-01-21 DIAGNOSIS — I4891 Unspecified atrial fibrillation: Secondary | ICD-10-CM

## 2019-01-21 DIAGNOSIS — Z5181 Encounter for therapeutic drug level monitoring: Secondary | ICD-10-CM | POA: Diagnosis not present

## 2019-01-21 LAB — POCT INR: INR: 2.1 (ref 2.0–3.0)

## 2019-01-21 MED ORDER — DILTIAZEM HCL ER COATED BEADS 120 MG PO CP24: 120.0000 mg | ORAL_CAPSULE | Freq: Every day | ORAL | 3 refills | Status: DC

## 2019-01-31 ENCOUNTER — Telehealth: Payer: Self-pay | Admitting: Cardiology

## 2019-01-31 NOTE — Telephone Encounter (Signed)
The patient stated her swelling occurs on the tops of both of her feet. She states it occurs usually every night, where it used to occur once in a while. She has been working to reduce her salt, but no improvement on her swelling. She stated she has not had any afib since her ablation. Her BP today: 141/71 and HR: 68. In the morning her swelling improves but then comes back each night.

## 2019-01-31 NOTE — Telephone Encounter (Signed)
Start Lasix 20mg  daily for 3 days and then PRN

## 2019-01-31 NOTE — Telephone Encounter (Signed)
New Message   Pt c/o swelling: STAT is pt has developed SOB within 24 hours  1) How much weight have you gained and in what time span? Unknown, patient does not weigh herself   2) If swelling, where is the swelling located? feet  3) Are you currently taking a fluid pill? no   4) Are you currently SOB? no  5) Do you have a log of your daily weights (if so, list)? no  6) Have you gained 3 pounds in a day or 5 pounds in a week? No   7) Have you traveled recently? no

## 2019-02-02 MED ORDER — FUROSEMIDE 20 MG PO TABS: ORAL_TABLET | ORAL | 3 refills | Status: DC

## 2019-02-02 NOTE — Telephone Encounter (Signed)
Spoke with the patient, she expressed understanding about taking lasix and she will call the office if she does not improve.

## 2019-02-02 NOTE — Telephone Encounter (Signed)
Follow up   Patient states that she is waiting on call to find out what she needs to do about swollen feet. Please call.

## 2019-02-22 ENCOUNTER — Telehealth: Payer: Self-pay

## 2019-02-22 NOTE — Telephone Encounter (Signed)

## 2019-03-01 ENCOUNTER — Ambulatory Visit (INDEPENDENT_AMBULATORY_CARE_PROVIDER_SITE_OTHER): Payer: Medicare Other | Admitting: *Deleted

## 2019-03-01 ENCOUNTER — Other Ambulatory Visit: Payer: Self-pay

## 2019-03-01 DIAGNOSIS — Z5181 Encounter for therapeutic drug level monitoring: Secondary | ICD-10-CM

## 2019-03-01 DIAGNOSIS — I4891 Unspecified atrial fibrillation: Secondary | ICD-10-CM

## 2019-03-01 LAB — POCT INR: INR: 2.4 (ref 2.0–3.0)

## 2019-03-01 NOTE — Patient Instructions (Addendum)
Description   Continue taking Warfarin 4mg  daily except 5mg  on Tuesdays and Thursdays. Recheck INR in 5 weeks. Coumadin Clinic [phone] Main [phone]

## 2019-03-29 ENCOUNTER — Other Ambulatory Visit: Payer: Self-pay

## 2019-03-29 ENCOUNTER — Encounter (HOSPITAL_COMMUNITY): Payer: Self-pay | Admitting: Nurse Practitioner

## 2019-03-29 ENCOUNTER — Ambulatory Visit (HOSPITAL_COMMUNITY)
Admission: RE | Admit: 2019-03-29 | Discharge: 2019-03-29 | Disposition: A | Discharge status: Home / Self Care | Payer: Medicare Other | Source: Ambulatory Visit | Attending: Nurse Practitioner | Admitting: Nurse Practitioner

## 2019-03-29 VITALS — BP 146/68 | HR 87 | Ht 65.0 in

## 2019-03-29 DIAGNOSIS — Z833 Family history of diabetes mellitus: Secondary | ICD-10-CM | POA: Diagnosis not present

## 2019-03-29 DIAGNOSIS — Z7984 Long term (current) use of oral hypoglycemic drugs: Secondary | ICD-10-CM | POA: Diagnosis not present

## 2019-03-29 DIAGNOSIS — Z923 Personal history of irradiation: Secondary | ICD-10-CM | POA: Diagnosis not present

## 2019-03-29 DIAGNOSIS — Z885 Allergy status to narcotic agent status: Secondary | ICD-10-CM | POA: Insufficient documentation

## 2019-03-29 DIAGNOSIS — E7800 Pure hypercholesterolemia, unspecified: Secondary | ICD-10-CM | POA: Insufficient documentation

## 2019-03-29 DIAGNOSIS — I10 Essential (primary) hypertension: Secondary | ICD-10-CM | POA: Diagnosis not present

## 2019-03-29 DIAGNOSIS — D563 Thalassemia minor: Secondary | ICD-10-CM | POA: Diagnosis not present

## 2019-03-29 DIAGNOSIS — Z6841 Body Mass Index (BMI) 40.0 and over, adult: Secondary | ICD-10-CM | POA: Diagnosis not present

## 2019-03-29 DIAGNOSIS — Z79899 Other long term (current) drug therapy: Secondary | ICD-10-CM | POA: Diagnosis not present

## 2019-03-29 DIAGNOSIS — Z7901 Long term (current) use of anticoagulants: Secondary | ICD-10-CM | POA: Diagnosis not present

## 2019-03-29 DIAGNOSIS — I4819 Other persistent atrial fibrillation: Secondary | ICD-10-CM | POA: Insufficient documentation

## 2019-03-29 DIAGNOSIS — K219 Gastro-esophageal reflux disease without esophagitis: Secondary | ICD-10-CM | POA: Diagnosis not present

## 2019-03-29 DIAGNOSIS — E119 Type 2 diabetes mellitus without complications: Secondary | ICD-10-CM | POA: Diagnosis not present

## 2019-03-29 DIAGNOSIS — Z853 Personal history of malignant neoplasm of breast: Secondary | ICD-10-CM | POA: Diagnosis not present

## 2019-03-29 DIAGNOSIS — G4733 Obstructive sleep apnea (adult) (pediatric): Secondary | ICD-10-CM | POA: Diagnosis not present

## 2019-03-29 DIAGNOSIS — E559 Vitamin D deficiency, unspecified: Secondary | ICD-10-CM | POA: Diagnosis not present

## 2019-03-29 DIAGNOSIS — Z888 Allergy status to other drugs, medicaments and biological substances status: Secondary | ICD-10-CM | POA: Insufficient documentation

## 2019-03-29 DIAGNOSIS — Z881 Allergy status to other antibiotic agents status: Secondary | ICD-10-CM | POA: Insufficient documentation

## 2019-03-29 NOTE — Progress Notes (Signed)
Primary Care Physician: Josetta Huddle, MD Referring Physician: [facility] f/u EP:Dr. Allred Cardiologist: Dr.Turner   Jenna Mcdonald is a 73 y.o. female with a h/o persistent afib that was   hospitalized 07/20/18  for failure of flecainide to maintain  SR. She was started on amiodarone. Dr. Rayann Heman consulted and thought pt may be an ablation candidate.  She is on warfarin.  F/u in fib clinic, 10/19/18. She is s/p ablation x one month. She reports that she has done exceptionally well, no swallowing issues or groin concerns. She has not noted any afib. She is having however, some GI issues that she thinks may be from amiodarone and is asking to stop drug. She is also interested in the [facility] exercise program and will pass her name along to Jenna Mcdonald, to be considered for next start up exercise group. She remains on warfarin.   F/u in afib clinic, 03/29/19. She has done great with ablation, no afib that she is aware of.  No bleeding issues with warfarin.  Today, she denies symptoms of palpitations, chest pain, shortness of breath, orthopnea, PND, lower extremity edema, dizziness, presyncope, syncope, or neurologic sequela. The patient is tolerating medications without difficulties and is otherwise without complaint today.   Past Medical History:  Diagnosis Date  . Anemia   . Aortic stenosis    mild by echo 08/2016  . Breast CA ([place])    left  . Carotid stenosis    1-39% left  . Degenerative disc disease, lumbar    of the knees  . Diabetes mellitus   . 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[place])   . Fatigue    Severe secondary to to gemfibrozil  . Gastroesophageal reflux disease    denies  . GI bleeding    a. 06/2018: EGD showed multiple nonbleeding duodenal ulcers. 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  . Hammertoe    left second toe  . Hypercholesteremia   . Hypertension   . Insomnia   . Irritable bowel   . Low back pain   .  Metabolic syndrome   . Morbid obesity ([place])   . OSA on CPAP \   bipap  . Osteoarthritis   . Osteopenia   . Persistent atrial fibrillation    s/p afib ablation - Dr. Rayann Heman 08/2018  . Personal history of radiation therapy   . Pneumonia   . PONV (postoperative nausea and vomiting)   . Sigmoid diverticulosis   . Thalassemia minor   . Vitamin D deficiency    Past Surgical History:  Procedure Laterality Date  . ATRIAL FIBRILLATION ABLATION N/A 09/21/2018   Procedure: ATRIAL FIBRILLATION ABLATION;  Surgeon: Thompson Grayer, MD;  Location: [place] [facility];  Service: Cardiovascular;  Laterality: N/A;  . BIOPSY  07/06/2018   Procedure: BIOPSY;  Surgeon: Clarene Essex, MD;  Location: [place];  Service: Endoscopy;;  . BREAST LUMPECTOMY Left 05/28/2009  . BREAST SURGERY Left 10   lumpectomy  . CARDIOVERSION N/A 07/07/2018   Procedure: CARDIOVERSION;  Surgeon: [name], MD;  Location: [facility] ENDOSCOPY;  Service: Cardiovascular;  Laterality: N/A;  . CARDIOVERSION N/A 07/14/2018   Procedure: CARDIOVERSION;  Surgeon: Larey Dresser, MD;  Location: [facility] ENDOSCOPY;  Service: Cardiovascular;  Laterality: N/A;  . CESAREAN SECTION    . COLONOSCOPY WITH PROPOFOL N/A 07/06/2018   Procedure: COLONOSCOPY WITH PROPOFOL;  Surgeon: Clarene Essex, MD;  Location: [place];  Service: Endoscopy;  Laterality: N/A;  . CYSTECTOMY  70  pilonidal   . DILATION AND CURETTAGE OF UTERUS    . ESOPHAGOGASTRODUODENOSCOPY (EGD) WITH PROPOFOL N/A 07/06/2018   Procedure: ESOPHAGOGASTRODUODENOSCOPY (EGD) WITH PROPOFOL;  Surgeon: Clarene Essex, MD;  Location: [place];  Service: Endoscopy;  Laterality: N/A;  . EYE SURGERY Bilateral 12/14   cataracts  . HOT HEMOSTASIS N/A 07/06/2018   Procedure: HOT HEMOSTASIS (ARGON PLASMA COAGULATION/BICAP);  Surgeon: Clarene Essex, MD;  Location: [place];  Service: Endoscopy;  Laterality: N/A;  . MAXIMUM ACCESS (MAS)POSTERIOR LUMBAR INTERBODY FUSION (PLIF) 2 LEVEL N/A  09/22/2013   Procedure: FOR MAXIMUM ACCESS  POSTERIOR LUMBAR INTERBODY FUSION LUMBAR THREE-FOUR FOUR-FIVE;  Surgeon: Eustace Moore, MD;  Location: [place] NEURO ORS;  Service: Neurosurgery;  Laterality: N/A;  . OOPHORECTOMY     wedge section not rem  . 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 REPLACEMENT TOTAL KNEE    . TEE WITHOUT CARDIOVERSION N/A 07/07/2018   Procedure: TRANSESOPHAGEAL ECHOCARDIOGRAM (TEE);  Surgeon: [name], MD;  Location: [place];  Service: Cardiovascular;  Laterality: N/A;  . TEE WITHOUT CARDIOVERSION N/A 09/21/2018   Procedure: TRANSESOPHAGEAL ECHOCARDIOGRAM (TEE);  Surgeon: Acie Fredrickson Wonda Cheng, MD;  Location: [facility] ENDOSCOPY;  Service: Cardiovascular;  Laterality: N/A;  . TONSILLECTOMY    . TUBAL LIGATION      Current Outpatient Medications  Medication Sig Dispense Refill  . Calcium Carbonate-Vitamin D (CALCIUM 600 + D PO) Take 1 tablet by mouth daily.     . Cholecalciferol (VITAMIN D3) 25 MCG (1000 UT) CAPS Take by mouth.    . diltiazem (CARDIZEM CD) 120 MG 24 hr capsule Take 1 capsule (120 mg total) by mouth daily. 90 capsule 3  . diphenhydramine-acetaminophen (TYLENOL PM) 25-500 MG TABS tablet Take 2 tablets by mouth as needed (sleep).     . furosemide (LASIX) 20 MG tablet Take 20 mg by mouth as needed.    Marland Kitchen glimepiride (AMARYL) 2 MG tablet Take 2 mg by mouth daily with breakfast.   3  . Iron-FA-B Cmp-C-Biot-Probiotic (FUSION PLUS) CAPS Take 1 capsule by mouth daily.    . metFORMIN (GLUCOPHAGE-XR) 500 MG 24 hr tablet Take 500 mg by mouth 2 (two) times daily.   3  . warfarin (COUMADIN) 4 MG tablet TAKE 4MG  DAILY EXCEPT 5MG  ON TUESDAY AND THURSDAY OR AS DIRECTED BY COUMADIN CLINIC. 80 tablet 0  . warfarin (COUMADIN) 5 MG tablet TAKE 5MG  ON TUESDAY AND THURSDAY AND 4MG  DAILY OR AS DIRECTED BY COUMADIN CLINIC. 30 tablet 0   No current facility-administered medications for this encounter.     Allergies  Allergen Reactions  . Metoprolol Shortness Of Breath, Diarrhea  and Other (See Comments)    Fatigue   . Femara [Letrozole] Other (See Comments)    unknown  . Gemfibrozil Other (See Comments)    Severe fatigue  . Other Other (See Comments)    Numerous cancer drugs - unknown reaction  . Tamoxifen Other (See Comments)    Bone and body aches   . Tetracyclines & Related Other (See Comments)    Vaginal infections    . Toprol Xl [Metoprolol Tartrate] Other (See Comments)    Hair loss  . Verapamil Other (See Comments)    Unknown  . Vicodin [Hydrocodone-Acetaminophen] Nausea And Vomiting and Other (See Comments)    Patient reports terrible nausea; plain tylenol is ok with patient  . Aromasin [Exemestane] Other (See Comments)    Hair loss     Social History   Socioeconomic History  . Marital status: Single  Spouse name: Not on file  . Number of children: Not on file  . Years of education: Not on file  . Highest education level: Not on file  Occupational History  . Not on file  Social Needs  . Financial resource strain: Not on file  . Food insecurity    Worry: Not on file    Inability: Not on file  . Transportation needs    Medical: Not on file    Non-medical: Not on file  Tobacco Use  . Smoking status: Former Smoker    Packs/day: 0.50    Years: 10.00    Pack years: 5.00    Types: Cigarettes    Quit date: 11/12/2003    Years since quitting: 15.3  . Smokeless tobacco: Never Used  Substance and Sexual Activity  . Alcohol use: Yes    Comment: occasionally  . Drug use: No  . Sexual activity: Never    Birth control/protection: Post-menopausal  Lifestyle  . Physical activity    Days per week: Not on file    Minutes per session: Not on file  . Stress: Not on file  Relationships  . Social Herbalist on phone: Not on file    Gets together: Not on file    Attends religious service: Not on file    Active member of club or organization: Not on file    Attends meetings of clubs or organizations: Not on file     Relationship status: Not on file  . Intimate partner violence    Fear of current or ex partner: Not on file    Emotionally abused: Not on file    Physically abused: Not on file    Forced sexual activity: Not on file  Other Topics Concern  . Not on file  Social History Narrative  . Not on file    Family History  Problem Relation Age of Onset  . Anesthesia problems Mother   . Diabetes Mother   . Diabetes Father     ROS- All systems are reviewed and negative except as per the HPI above  Physical Exam: Vitals:   03/29/19 1002  BP: (!) 146/68  Pulse: 87  Height: 5\' 5"  (1.651 m)   Wt Readings from Last 3 Encounters:  10/19/18 112.9 kg  09/22/18 113.1 kg  08/09/18 112.5 kg    Labs: Lab Results  Component Value Date   NA 140 09/21/2018   K 4.5 09/21/2018   CL 104 09/21/2018   CO2 23 09/21/2018   GLUCOSE 120 (H) 09/21/2018   BUN 22 09/21/2018   CREATININE 1.09 (H) 09/21/2018   CALCIUM 9.4 09/21/2018   MG 1.8 07/21/2018   Lab Results  Component Value Date   INR 2.4 03/01/2019   Lab Results  Component Value Date   CHOL  05/14/2008    146        ATP III CLASSIFICATION:  <200     mg/dL   Desirable  200-239  mg/dL   Borderline High  >=240    mg/dL   High   HDL 27 (L) 05/14/2008   LDLCALC  05/14/2008    98        Total Cholesterol/HDL:CHD Risk Coronary Heart Disease Risk Table                     Men   Women  1/2 Average Risk   3.4   3.3   TRIG 105 05/14/2008  GEN- The patient is well appearing, alert and oriented x 3 today.   Head- normocephalic, atraumatic Eyes-  Sclera clear, conjunctiva pink Ears- hearing intact Oropharynx- clear Neck- supple, no JVP Lymph- no cervical lymphadenopathy Lungs- Clear to ausculation bilaterally, normal work of breathing Heart-  regular rate and rhythm, no murmurs, rubs or gallops, PMI not laterally displaced GI- soft, NT, ND, + BS Extremities- no clubbing, cyanosis, or edema MS- no significant deformity or  atrophy Skin- no rash or lesion Psych- euthymic mood, full affect Neuro- strength and sensation are intact  EKG- NSR at 87 bpm, Pr int 158 ms, qrs int 66 ms, qtc 418  ms Echo-Study Conclusions  - Left ventricle: The cavity size was normal. Wall thickness was   increased in a pattern of moderate LVH. Systolic function was   normal. The estimated ejection fraction was in the range of 60%   to 65%. Wall motion was normal; there were no regional wall   motion abnormalities. Doppler parameters are consistent with   abnormal left ventricular relaxation (grade 1 diastolic   dysfunction). The E/e&' ratio is between 8-15, suggesting   indeterminate LV filling pressure, - Aortic valve: Mildly calcified leaflets. Mild stenosis, Mean   gradient (S): 11 mm Hg. Valve area (VTI): 1.85 cm^2. Valve area   (Vmean): 1.86 cm^2. - Mitral valve: Calcified annulus. Mildly thickened leaflets .   There was trivial regurgitation. - Left atrium: Moderately dilated. 40 mm, volume 81.1 - Right atrium: The atrium was mildly dilated. - Atrial septum: No defect or patent foramen ovale was identified. - Tricuspid valve: There was trivial regurgitation. - Pulmonary arteries: PA peak pressure: 25 mm Hg (S). - Inferior vena cava: The IVC is >2.1 cm, but does not collapse   >50%, suggesting an elevated RA pressure of 8 mmHg.  Impressions:  - Compared to a recent study, the LVEF is higher at 60-65%. There   is mild aortic stenosis.    Assessment and Plan: 1. Persistent afib S/p ablation x 6 months and doing very well staying in SR She is now off amiodarone Continue Cardizem 120 mg daily   2. HTN Stable    F/u with Dr. Rayann Heman  In 6 months Afib clinic as needed  Geroge Baseman. Dashaun Onstott, [facility] [address] [place], [place] [address] [phone]

## 2019-04-05 ENCOUNTER — Ambulatory Visit (INDEPENDENT_AMBULATORY_CARE_PROVIDER_SITE_OTHER): Payer: Medicare Other | Admitting: *Deleted

## 2019-04-05 ENCOUNTER — Other Ambulatory Visit: Payer: Self-pay

## 2019-04-05 DIAGNOSIS — I4891 Unspecified atrial fibrillation: Secondary | ICD-10-CM | POA: Diagnosis not present

## 2019-04-05 DIAGNOSIS — Z5181 Encounter for therapeutic drug level monitoring: Secondary | ICD-10-CM | POA: Diagnosis not present

## 2019-04-05 LAB — POCT INR: INR: 1.6 — AB (ref 2.0–3.0)

## 2019-04-05 NOTE — Patient Instructions (Signed)
Description    Take 7.5 mg today and 6 mg tomorrow, then continue taking Warfarin 4mg  daily except 5mg  on Tuesdays and Thursdays. Recheck INR in 2 weeks. Coumadin Clinic [phone] Main [phone]

## 2019-04-06 ENCOUNTER — Telehealth (HOSPITAL_COMMUNITY): Payer: Self-pay | Admitting: *Deleted

## 2019-04-06 NOTE — Telephone Encounter (Signed)
Pt needs a letter from Orson Eva, NP that she is cleared for exercise to be able [name]  Please draft, sign and scan and email to [employer] [employer].

## 2019-04-11 ENCOUNTER — Encounter (HOSPITAL_COMMUNITY): Payer: Self-pay | Admitting: *Deleted

## 2019-04-14 ENCOUNTER — Encounter (HOSPITAL_COMMUNITY): Payer: Self-pay | Admitting: *Deleted

## 2019-04-14 NOTE — Telephone Encounter (Signed)
Requested information forwarded to [name] RN to exercise.

## 2019-04-17 ENCOUNTER — Other Ambulatory Visit: Payer: Self-pay | Admitting: Cardiology

## 2019-04-19 ENCOUNTER — Other Ambulatory Visit: Payer: Self-pay

## 2019-04-19 ENCOUNTER — Ambulatory Visit (INDEPENDENT_AMBULATORY_CARE_PROVIDER_SITE_OTHER): Payer: Medicare Other | Admitting: *Deleted

## 2019-04-19 DIAGNOSIS — I4891 Unspecified atrial fibrillation: Secondary | ICD-10-CM | POA: Diagnosis not present

## 2019-04-19 DIAGNOSIS — Z5181 Encounter for therapeutic drug level monitoring: Secondary | ICD-10-CM | POA: Diagnosis not present

## 2019-04-19 LAB — POCT INR: INR: 1.7 — AB (ref 2.0–3.0)

## 2019-04-19 NOTE — Patient Instructions (Addendum)
Description   Take 7.5 mg today then start continue taking Warfarin 4mg  daily except 5mg  on Sundays, Tuesdays, and Thursdays. Recheck INR in 3 weeks. Coumadin Clinic [phone] Main [phone]

## 2019-04-26 ENCOUNTER — Other Ambulatory Visit: Payer: Self-pay | Admitting: Pharmacist

## 2019-04-26 MED ORDER — WARFARIN SODIUM 5 MG PO TABS: ORAL_TABLET | ORAL | 0 refills | Status: DC

## 2019-04-28 ENCOUNTER — Other Ambulatory Visit: Payer: Self-pay | Admitting: Cardiology

## 2019-04-28 NOTE — Telephone Encounter (Signed)
This is a A-Fib clinic pt 

## 2019-05-06 ENCOUNTER — Other Ambulatory Visit: Payer: Self-pay | Admitting: Nurse Practitioner

## 2019-05-10 ENCOUNTER — Ambulatory Visit (INDEPENDENT_AMBULATORY_CARE_PROVIDER_SITE_OTHER): Payer: Medicare Other | Admitting: *Deleted

## 2019-05-10 ENCOUNTER — Other Ambulatory Visit: Payer: Self-pay

## 2019-05-10 DIAGNOSIS — I4891 Unspecified atrial fibrillation: Secondary | ICD-10-CM

## 2019-05-10 DIAGNOSIS — Z5181 Encounter for therapeutic drug level monitoring: Secondary | ICD-10-CM

## 2019-05-10 LAB — POCT INR: INR: 2.3 (ref 2.0–3.0)

## 2019-05-10 NOTE — Patient Instructions (Signed)
Description   Continue taking Warfarin 4mg  daily except 5mg  on Sundays, Tuesdays, and Thursdays. Recheck INR in 4 weeks. Coumadin Clinic [phone] Main [phone]

## 2019-05-30 ENCOUNTER — Other Ambulatory Visit: Payer: Self-pay

## 2019-05-30 ENCOUNTER — Ambulatory Visit
Admission: RE | Admit: 2019-05-30 | Discharge: 2019-05-30 | Disposition: A | Discharge status: Home / Self Care | Payer: Medicare Other | Source: Ambulatory Visit | Attending: Internal Medicine | Admitting: Internal Medicine

## 2019-05-30 DIAGNOSIS — Z1231 Encounter for screening mammogram for malignant neoplasm of breast: Secondary | ICD-10-CM

## 2019-06-06 ENCOUNTER — Other Ambulatory Visit: Payer: Self-pay

## 2019-06-06 ENCOUNTER — Ambulatory Visit (INDEPENDENT_AMBULATORY_CARE_PROVIDER_SITE_OTHER): Payer: Medicare Other | Admitting: *Deleted

## 2019-06-06 DIAGNOSIS — I4891 Unspecified atrial fibrillation: Secondary | ICD-10-CM

## 2019-06-06 DIAGNOSIS — Z5181 Encounter for therapeutic drug level monitoring: Secondary | ICD-10-CM | POA: Diagnosis not present

## 2019-06-06 LAB — POCT INR: INR: 1.9 — AB (ref 2.0–3.0)

## 2019-06-06 NOTE — Patient Instructions (Signed)
Description    Take 6 mg today, then continue taking Warfarin 4mg  daily except 5mg  on Sundays, Tuesdays, and Thursdays. Recheck INR in 4 weeks- per pt request. Coumadin Clinic [phone] Main [phone]

## 2019-06-16 ENCOUNTER — Other Ambulatory Visit: Payer: Self-pay | Admitting: Internal Medicine

## 2019-06-16 MED ORDER — DILTIAZEM HCL ER COATED BEADS 120 MG PO CP24: 120.0000 mg | ORAL_CAPSULE | Freq: Every day | ORAL | 2 refills | Status: DC

## 2019-06-16 NOTE — Telephone Encounter (Signed)
Pt's medication was sent to pt's pharmacy as requested. Confirmation received.  °

## 2019-06-20 ENCOUNTER — Telehealth (HOSPITAL_COMMUNITY): Payer: Self-pay | Admitting: *Deleted

## 2019-06-20 MED ORDER — WARFARIN SODIUM 5 MG PO TABS: ORAL_TABLET | ORAL | 0 refills | Status: DC

## 2019-06-20 MED ORDER — WARFARIN SODIUM 4 MG PO TABS: ORAL_TABLET | ORAL | 0 refills | Status: DC

## 2019-06-20 NOTE — Telephone Encounter (Signed)
Patient needs refill [facility].

## 2019-06-20 NOTE — Telephone Encounter (Signed)
[facility]in 4 and 5mg  tablets

## 2019-06-21 ENCOUNTER — Telehealth: Payer: Self-pay | Admitting: Cardiology

## 2019-06-21 NOTE — Telephone Encounter (Signed)
Updated instructions provided

## 2019-06-21 NOTE — Telephone Encounter (Signed)
[facility] mail [facility] pharmacy is calling stating that pt's medication Warfarin was sent in dispensing not enough medication for pt take as prescribed. Pharmacy would like a call back to clarify at [phone] Ref [id number], pharmacy stated the amount that was sent in will only last the pt for 5 days. Please address

## 2019-07-04 ENCOUNTER — Other Ambulatory Visit: Payer: Self-pay

## 2019-07-04 ENCOUNTER — Ambulatory Visit (INDEPENDENT_AMBULATORY_CARE_PROVIDER_SITE_OTHER): Payer: Medicare Other | Admitting: *Deleted

## 2019-07-04 DIAGNOSIS — Z5181 Encounter for therapeutic drug level monitoring: Secondary | ICD-10-CM

## 2019-07-04 DIAGNOSIS — I4891 Unspecified atrial fibrillation: Secondary | ICD-10-CM | POA: Diagnosis not present

## 2019-07-04 LAB — POCT INR: INR: 1.9 — AB (ref 2.0–3.0)

## 2019-07-04 NOTE — Patient Instructions (Addendum)
Description    Take 6 mg today, then start taking Warfarin 4mg  daily, except for the 5 mg on Sunday, Tuesday, Thursday and Saturday. Recheck INR in 4 weeks- per pt request.  Coumadin Clinic [phone] Main [phone]

## 2019-08-02 ENCOUNTER — Ambulatory Visit (INDEPENDENT_AMBULATORY_CARE_PROVIDER_SITE_OTHER): Payer: Medicare Other | Admitting: *Deleted

## 2019-08-02 ENCOUNTER — Other Ambulatory Visit: Payer: Self-pay

## 2019-08-02 DIAGNOSIS — I4891 Unspecified atrial fibrillation: Secondary | ICD-10-CM

## 2019-08-02 DIAGNOSIS — Z5181 Encounter for therapeutic drug level monitoring: Secondary | ICD-10-CM

## 2019-08-02 LAB — POCT INR: INR: 2.1 (ref 2.0–3.0)

## 2019-08-02 NOTE — Patient Instructions (Addendum)
Description   Continue taking Warfarin 5mg  daily, except for the 4 mg on Monday, Wednesday and Friday. Recheck INR in 5 weeks.  Coumadin Clinic [phone] Main [phone]

## 2019-08-12 ENCOUNTER — Other Ambulatory Visit: Payer: Self-pay | Admitting: Cardiology

## 2019-08-15 ENCOUNTER — Telehealth: Payer: Self-pay | Admitting: Internal Medicine

## 2019-08-15 NOTE — Telephone Encounter (Signed)
New Message:  Pt received a letter and called this morning to schedule a follow-up appointment to see Dr. Rayann Heman. The patient notes that she has not had any issues and feels fine. She normally sees Dr. Radford Pax, but is not due for her yearly follow-up with Dr. Radford Pax until May. She would like to know why she needs to see two doctors instead of just one. She was also frustrated that the schedules were only open until the end of March.  Please call

## 2019-09-03 ENCOUNTER — Other Ambulatory Visit (HOSPITAL_COMMUNITY): Payer: Self-pay | Admitting: Cardiology

## 2019-09-06 ENCOUNTER — Other Ambulatory Visit: Payer: Self-pay

## 2019-09-06 ENCOUNTER — Ambulatory Visit (INDEPENDENT_AMBULATORY_CARE_PROVIDER_SITE_OTHER): Payer: Medicare PPO | Admitting: *Deleted

## 2019-09-06 DIAGNOSIS — Z5181 Encounter for therapeutic drug level monitoring: Secondary | ICD-10-CM

## 2019-09-06 DIAGNOSIS — I4891 Unspecified atrial fibrillation: Secondary | ICD-10-CM

## 2019-09-06 LAB — POCT INR: INR: 2.4 (ref 2.0–3.0)

## 2019-09-06 NOTE — Patient Instructions (Signed)
Description   Continue taking Warfarin 5mg  daily, except for the 4 mg on Monday, Wednesday and Friday. Recheck INR in 5 weeks.  Coumadin Clinic [phone] Main [phone]

## 2019-09-12 NOTE — Progress Notes (Signed)
Cardiology Office Note:    Date:  09/13/2019   ID:  Jenna Mcdonald, DOB 02-15-1946, MRN [id number]  PCP:  Josetta Huddle, MD  Cardiologist:  Fransico Him, MD    Referring MD: Josetta Huddle, MD   Chief Complaint  Patient presents with  . Sleep Apnea  . Aortic Stenosis  . Hypertension  . Atrial Fibrillation    History of Present Illness:    Jenna Mcdonald is a 74 y.o. female with a hx of OSA on BiPAP, HTN, PAF, mild AS by echo 1/2018and morbid obesity.  She is here today for followup and is doing well.  SHe denies any chest pain or pressure, SOB, DOE, PND, orthopnea, LE edema, dizziness, palpitations or syncope. She is compliant with her meds and is tolerating meds with no SE.    She is doing well with her CPAP device and thinks that she has gotten used to it.  She tolerates the mask and feels the pressure is adequate.  Since going on CPAP she feels rested in the am and has no significant daytime sleepiness.  She denies any significant mouth or nasal dryness or nasal congestion.  She does not think that he snores.     Past Medical History:  Diagnosis Date  . Anemia   . Aortic stenosis    mild by echo 08/2016  . Breast CA ([place])    left  . Carotid stenosis    1-39% left  . Degenerative disc disease, lumbar    of the knees  . Diabetes mellitus   . 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[place])   . Fatigue    Severe secondary to to gemfibrozil  . Gastroesophageal reflux disease    denies  . GI bleeding    a. 06/2018: EGD showed multiple nonbleeding duodenal ulcers. 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  . Hammertoe    left second toe  . Hypercholesteremia   . Hypertension   . Insomnia   . Irritable bowel   . Low back pain   . Metabolic syndrome   . Morbid obesity ([place])   . OSA on CPAP \   bipap  . Osteoarthritis   . Osteopenia   . Persistent atrial fibrillation [facility])    s/p afib ablation - Dr. Rayann Heman  08/2018  . Personal history of radiation therapy   . Pneumonia   . PONV (postoperative nausea and vomiting)   . Sigmoid diverticulosis   . Thalassemia minor   . Vitamin D deficiency     Past Surgical History:  Procedure Laterality Date  . ATRIAL FIBRILLATION ABLATION N/A 09/21/2018   Procedure: ATRIAL FIBRILLATION ABLATION;  Surgeon: Thompson Grayer, MD;  Location: [place] CV LAB;  Service: Cardiovascular;  Laterality: N/A;  . BIOPSY  07/06/2018   Procedure: BIOPSY;  Surgeon: Clarene Essex, MD;  Location: [place];  Service: Endoscopy;;  . BREAST LUMPECTOMY Left 05/28/2009  . BREAST SURGERY Left 10   lumpectomy  . CARDIOVERSION N/A 07/07/2018   Procedure: CARDIOVERSION;  Surgeon: [name], MD;  Location: [facility] ENDOSCOPY;  Service: Cardiovascular;  Laterality: N/A;  . CARDIOVERSION N/A 07/14/2018   Procedure: CARDIOVERSION;  Surgeon: Larey Dresser, MD;  Location: [facility] ENDOSCOPY;  Service: Cardiovascular;  Laterality: N/A;  . CESAREAN SECTION    . COLONOSCOPY WITH PROPOFOL N/A 07/06/2018   Procedure: COLONOSCOPY WITH PROPOFOL;  Surgeon: Clarene Essex, MD;  Location: [place];  Service: Endoscopy;  Laterality: N/A;  . CYSTECTOMY  70  pilonidal   . DILATION AND CURETTAGE OF UTERUS    . ESOPHAGOGASTRODUODENOSCOPY (EGD) WITH PROPOFOL N/A 07/06/2018   Procedure: ESOPHAGOGASTRODUODENOSCOPY (EGD) WITH PROPOFOL;  Surgeon: Clarene Essex, MD;  Location: [place];  Service: Endoscopy;  Laterality: N/A;  . EYE SURGERY Bilateral 12/14   cataracts  . HOT HEMOSTASIS N/A 07/06/2018   Procedure: HOT HEMOSTASIS (ARGON PLASMA COAGULATION/BICAP);  Surgeon: Clarene Essex, MD;  Location: [place];  Service: Endoscopy;  Laterality: N/A;  . MAXIMUM ACCESS (MAS)POSTERIOR LUMBAR INTERBODY FUSION (PLIF) 2 LEVEL N/A 09/22/2013   Procedure: FOR MAXIMUM ACCESS  POSTERIOR LUMBAR INTERBODY FUSION LUMBAR THREE-FOUR FOUR-FIVE;  Surgeon: Eustace Moore, MD;  Location: [place] NEURO ORS;  Service:  Neurosurgery;  Laterality: N/A;  . OOPHORECTOMY     wedge section not rem  . 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 REPLACEMENT TOTAL KNEE    . TEE WITHOUT CARDIOVERSION N/A 07/07/2018   Procedure: TRANSESOPHAGEAL ECHOCARDIOGRAM (TEE);  Surgeon: [name], MD;  Location: [place];  Service: Cardiovascular;  Laterality: N/A;  . TEE WITHOUT CARDIOVERSION N/A 09/21/2018   Procedure: TRANSESOPHAGEAL ECHOCARDIOGRAM (TEE);  Surgeon: Acie Fredrickson Wonda Cheng, MD;  Location: [facility] ENDOSCOPY;  Service: Cardiovascular;  Laterality: N/A;  . TONSILLECTOMY    . TUBAL LIGATION      Current Medications: Current Meds  Medication Sig  . B Complex Vitamins (VITAMIN B COMPLEX PO) Take 1 tablet by mouth daily.  . Calcium Carbonate-Vitamin D (CALCIUM 600 + D PO) Take 1 tablet by mouth daily.   . Cholecalciferol (VITAMIN D3) 25 MCG (1000 UT) CAPS Take by mouth.  . diltiazem (CARDIZEM CD) 120 MG 24 hr capsule Take 1 capsule (120 mg total) by mouth daily.  . diphenhydramine-acetaminophen (TYLENOL PM) 25-500 MG TABS tablet Take 2 tablets by mouth as needed (sleep).   Marland Kitchen glimepiride (AMARYL) 2 MG tablet Take 2 mg by mouth daily with breakfast.   . metFORMIN (GLUCOPHAGE-XR) 500 MG 24 hr tablet Take 500 mg by mouth 2 (two) times daily.   Glory Rosebush ULTRA test strip 1 each by Other route daily.  . rosuvastatin (CRESTOR) 5 MG tablet Take 5 mg by mouth once a week. Pt states she is taking weekly per Dr. Inda Merlin.  . warfarin (COUMADIN) 4 MG tablet TAKE 1 TABLET DAILY EXCEPT ON Monday, Wednesday, Friday OR AS DIRECTED BY COUMADIN CLINIC  . warfarin (COUMADIN) 5 MG tablet TAKE AS DIRECTED BY COUMADIN CLINIC FOUR DAYS A WEEK.     Allergies:   Metoprolol, Femara [letrozole], Gemfibrozil, Other, Tamoxifen, Tetracyclines & related, Toprol xl [metoprolol tartrate], Verapamil, Vicodin [hydrocodone-acetaminophen], and Aromasin [exemestane]   Social History   Socioeconomic History  . Marital status: Single    Spouse name: Not on  file  . Number of children: Not on file  . Years of education: Not on file  . Highest education level: Not on file  Occupational History  . Not on file  Tobacco Use  . Smoking status: Former Smoker    Packs/day: 0.50    Years: 10.00    Pack years: 5.00    Types: Cigarettes    Quit date: 11/12/2003    Years since quitting: 15.8  . Smokeless tobacco: Never Used  Substance and Sexual Activity  . Alcohol use: Yes    Comment: occasionally  . Drug use: No  . Sexual activity: Never    Birth control/protection: Post-menopausal  Other Topics Concern  . Not on file  Social History Narrative  . Not on file   Social  Determinants of Health   Financial Resource Strain:   . Difficulty of Paying Living Expenses: Not on file  Food Insecurity:   . Worried About Charity fundraiser in the Last Year: Not on file  . Ran Out of Food in the Last Year: Not on file  Transportation Needs:   . Lack of Transportation (Medical): Not on file  . Lack of Transportation (Non-Medical): Not on file  Physical Activity:   . Days of Exercise per Week: Not on file  . Minutes of Exercise per Session: Not on file  Stress:   . Feeling of Stress : Not on file  Social Connections:   . Frequency of Communication with Friends and Family: Not on file  . Frequency of Social Gatherings with Friends and Family: Not on file  . Attends Religious Services: Not on file  . Active Member of Clubs or Organizations: Not on file  . Attends Archivist Meetings: Not on file  . Marital Status: Not on file     Family History: The patient's family history includes Anesthesia problems in her mother; Diabetes in her father and mother.  ROS:   Please see the history of present illness.    ROS  All other systems reviewed and negative.   EKGs/Labs/Other Studies Reviewed:    The following studies were reviewed today: Labs, EKG, 2D echo, labs  EKG:  EKG is not ordered today.   Recent Labs: 09/21/2018: BUN 22;  Creatinine, Ser 1.09; Hemoglobin 9.7; Platelets 161; Potassium 4.5; Sodium 140   Recent Lipid Panel    Component Value Date/Time   CHOL  05/14/2008 0435    146        ATP III CLASSIFICATION:  <200     mg/dL   Desirable  200-239  mg/dL   Borderline High  >=240    mg/dL   High   TRIG 105 05/14/2008 0435   HDL 27 (L) 05/14/2008 0435   CHOLHDL 5.4 05/14/2008 0435   VLDL 21 05/14/2008 0435   LDLCALC  05/14/2008 0435    98        Total Cholesterol/HDL:CHD Risk Coronary Heart Disease Risk Table                     Men   Women  1/2 Average Risk   3.4   3.3    Physical Exam:    VS:  BP (!) 136/58   Pulse 74   Ht 5\' 5"  (1.651 m)   Wt 245 lb (111.1 kg)   SpO2 97%   BMI 40.77 kg/m     Wt Readings from Last 3 Encounters:  09/13/19 245 lb (111.1 kg)  10/19/18 249 lb (112.9 kg)  09/22/18 249 lb 4.8 oz (113.1 kg)     GEN:  Well nourished, well developed in no acute distress HEENT: Normal NECK: No JVD; No carotid bruits LYMPHATICS: No lymphadenopathy CARDIAC: RRR, no murmurs, rubs, gallops RESPIRATORY:  Clear to auscultation without rales, wheezing or rhonchi  ABDOMEN: Soft, non-tender, non-distended MUSCULOSKELETAL:  No edema; No deformity  SKIN: Warm and dry NEUROLOGIC:  Alert and oriented x 3 PSYCHIATRIC:  Normal affect   ASSESSMENT:    1. OSA (obstructive sleep apnea)   2. Nonrheumatic aortic valve stenosis   3. Essential hypertension   4. Persistent atrial fibrillation (HCC)   5. Obesity, Class III, BMI 40-49.9 (morbid obesity) ([place])   6. Type 2 diabetes mellitus without complication, without long-term current use of insulin (  [place])    PLAN:    In order of problems listed above:  1.  OSA -  The patient is tolerating PAP therapy well without any problems. The PAP download was reviewed today and showed an AHI of 2.3/hr on 12/8 cm H2O with 100% compliance in using more than 4 hours nightly.  The patient has been using and benefiting from PAP use and will continue to  benefit from therapy.   2.  Aortic stenosis -2D echo 08/2018 showed very mild AS with mean AVG 23mmHg.  3.  HTN -BP controlled -continue Cardizem CD 240mg  daily  4.  PAF -followed by Dr. Rayann Heman -s/p afib ablation 08/2018 -denies any palpitations -continue Cardizem and warfarin -she denies any bleeding problems on warfarin  5.  Obesity -I have encouraged her to get into a routine exercise program and cut back on carbs and portions.  -her exercise is limited by back pain  6.  Type 2 DM -followed by PCP -continue metformin 500mg  BID and Glimepiride   Medication Adjustments/Labs and Tests Ordered: Current medicines are reviewed at length with the patient today.  Concerns regarding medicines are outlined above.  No orders of the defined types were placed in this encounter.  No orders of the defined types were placed in this encounter.   Signed, Fransico Him, MD  09/13/2019 9:36 AM    [place]

## 2019-09-13 ENCOUNTER — Ambulatory Visit (INDEPENDENT_AMBULATORY_CARE_PROVIDER_SITE_OTHER): Payer: Medicare PPO | Admitting: Cardiology

## 2019-09-13 ENCOUNTER — Other Ambulatory Visit: Payer: Self-pay

## 2019-09-13 ENCOUNTER — Encounter: Payer: Self-pay | Admitting: Cardiology

## 2019-09-13 VITALS — BP 136/58 | HR 74 | Ht 65.0 in | Wt 245.0 lb

## 2019-09-13 DIAGNOSIS — I4819 Other persistent atrial fibrillation: Secondary | ICD-10-CM

## 2019-09-13 DIAGNOSIS — E119 Type 2 diabetes mellitus without complications: Secondary | ICD-10-CM

## 2019-09-13 DIAGNOSIS — I350 Nonrheumatic aortic (valve) stenosis: Secondary | ICD-10-CM

## 2019-09-13 DIAGNOSIS — G4733 Obstructive sleep apnea (adult) (pediatric): Secondary | ICD-10-CM

## 2019-09-13 DIAGNOSIS — I10 Essential (primary) hypertension: Secondary | ICD-10-CM

## 2019-09-13 NOTE — Patient Instructions (Signed)
Medication Instructions:  Your physician recommends that you continue on your current medications as directed. Please refer to the Current Medication list given to you today.  Labwork: None ordered.  Testing/Procedures: None ordered.  Follow-Up: Your physician recommends that you schedule a follow-up appointment in:   12 months with Dr. Turner  Any Other Special Instructions Will Be Listed Below (If Applicable).     If you need a refill on your cardiac medications before your next appointment, please call your pharmacy.  

## 2019-09-26 ENCOUNTER — Ambulatory Visit: Payer: Medicare PPO

## 2019-10-03 ENCOUNTER — Ambulatory Visit: Payer: Medicare PPO | Attending: Internal Medicine

## 2019-10-03 DIAGNOSIS — Z23 Encounter for immunization: Secondary | ICD-10-CM

## 2019-10-03 NOTE — Progress Notes (Signed)
   Covid-19 Vaccination Clinic  Name:  Jenna Mcdonald    MRN: [id number] DOB: May 05, 1946  10/03/2019  Jenna Mcdonald was observed post Covid-19 immunization for 15 minutes without incidence. She was provided with Vaccine Information Sheet and instruction to access the V-Safe system.   Jenna Mcdonald was instructed to call 911 with any severe reactions post vaccine: Marland Kitchen Difficulty breathing  . Swelling of your face and throat  . A fast heartbeat  . A bad rash all over your body  . Dizziness and weakness    Immunizations Administered    Name Date Dose VIS Date Route   Pfizer COVID-19 Vaccine 10/03/2019  8:20 AM 0.3 mL 08/05/2019 Intramuscular   Manufacturer: [place]   Lot: [id number]   [place]: [id number]

## 2019-10-11 ENCOUNTER — Other Ambulatory Visit: Payer: Self-pay

## 2019-10-11 ENCOUNTER — Ambulatory Visit (INDEPENDENT_AMBULATORY_CARE_PROVIDER_SITE_OTHER): Payer: Medicare PPO | Admitting: *Deleted

## 2019-10-11 DIAGNOSIS — Z5181 Encounter for therapeutic drug level monitoring: Secondary | ICD-10-CM

## 2019-10-11 DIAGNOSIS — I4891 Unspecified atrial fibrillation: Secondary | ICD-10-CM

## 2019-10-11 LAB — POCT INR: INR: 2.1 (ref 2.0–3.0)

## 2019-10-11 NOTE — Patient Instructions (Signed)
Description   Continue taking Warfarin 5mg  daily, except for the 4 mg on Monday, Wednesday and Friday. Recheck INR in 6 weeks.  Coumadin Clinic [phone] Main [phone]

## 2019-10-13 ENCOUNTER — Ambulatory Visit: Payer: Medicare PPO | Admitting: Internal Medicine

## 2019-10-26 ENCOUNTER — Ambulatory Visit: Payer: Medicare PPO | Attending: Internal Medicine

## 2019-10-26 DIAGNOSIS — Z23 Encounter for immunization: Secondary | ICD-10-CM | POA: Insufficient documentation

## 2019-10-26 NOTE — Progress Notes (Signed)
   Covid-19 Vaccination Clinic  Name:  Jenna Mcdonald    MRN: [id number] DOB: 04/04/1946  10/26/2019  Jenna Mcdonald was observed post Covid-19 immunization for 15 minutes without incident. She was provided with Vaccine Information Sheet and instruction to access the V-Safe system.   Jenna Mcdonald was instructed to call 911 with any severe reactions post vaccine: Marland Kitchen Difficulty breathing  . Swelling of face and throat  . A fast heartbeat  . A bad rash all over body  . Dizziness and weakness   Immunizations Administered    Name Date Dose VIS Date Route   Pfizer COVID-19 Vaccine 10/26/2019  9:10 AM 0.3 mL 08/05/2019 Intramuscular   Manufacturer: [place]   Lot: [id number]   [place]: [id number]

## 2019-11-16 ENCOUNTER — Other Ambulatory Visit (HOSPITAL_COMMUNITY): Payer: Self-pay | Admitting: Cardiology

## 2019-11-17 ENCOUNTER — Ambulatory Visit (INDEPENDENT_AMBULATORY_CARE_PROVIDER_SITE_OTHER): Payer: Medicare PPO | Admitting: Internal Medicine

## 2019-11-17 ENCOUNTER — Other Ambulatory Visit: Payer: Self-pay

## 2019-11-17 ENCOUNTER — Encounter: Payer: Self-pay | Admitting: Internal Medicine

## 2019-11-17 VITALS — BP 148/60 | HR 82 | Ht 65.0 in

## 2019-11-17 DIAGNOSIS — G4733 Obstructive sleep apnea (adult) (pediatric): Secondary | ICD-10-CM | POA: Diagnosis not present

## 2019-11-17 DIAGNOSIS — I119 Hypertensive heart disease without heart failure: Secondary | ICD-10-CM

## 2019-11-17 DIAGNOSIS — D6869 Other thrombophilia: Secondary | ICD-10-CM

## 2019-11-17 DIAGNOSIS — I4819 Other persistent atrial fibrillation: Secondary | ICD-10-CM | POA: Diagnosis not present

## 2019-11-17 NOTE — Patient Instructions (Addendum)
Medication Instructions:  Your physician recommends that you continue on your current medications as directed. Please refer to the Current Medication list given to you today.  LOSARTAN- blood pressure medication  Labwork: None ordered.  Testing/Procedures: None ordered.  Follow-Up: Your physician wants you to follow-up in: 6 months with Roderic Palau, NP at the [place] clinic.   You will receive a reminder letter in the mail two months in advance. If you don't receive a letter, please call our office to schedule the follow-up appointment.  Your physician wants you to follow-up in: one year with Dr. Rayann Heman.   SEE next page for appt.  Any Other Special Instructions Will Be Listed Below (If Applicable).  If you need a refill on your cardiac medications before your next appointment, please call your pharmacy.

## 2019-11-17 NOTE — Progress Notes (Signed)
PCP: Josetta Huddle, MD   Primary EP: Dr Rayann Heman  Jenna Mcdonald is a 74 y.o. female who presents today for routine electrophysiology followup.  Since last being seen in our clinic, the patient reports doing very well.  She has not been very active.  No real progress with lifestyle modification. Today, she denies symptoms of palpitations, chest pain, shortness of breath,  lower extremity edema, dizziness, presyncope, or syncope.  The patient is otherwise without complaint today.   Past Medical History:  Diagnosis Date  . Anemia   . Aortic stenosis    mild by echo 08/2016  . Breast CA ([place])    left  . Carotid stenosis    1-39% left  . Degenerative disc disease, lumbar    of the knees  . Diabetes mellitus   . 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[place])   . Fatigue    Severe secondary to to gemfibrozil  . Gastroesophageal reflux disease    denies  . GI bleeding    a. 06/2018: EGD showed multiple nonbleeding duodenal ulcers. 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  . Hammertoe    left second toe  . Hypercholesteremia   . Hypertension   . Insomnia   . Irritable bowel   . Low back pain   . Metabolic syndrome   . Morbid obesity ([place])   . OSA on CPAP \   bipap  . Osteoarthritis   . Osteopenia   . Persistent atrial fibrillation [facility])    s/p afib ablation - Dr. Rayann Heman 08/2018  . Personal history of radiation therapy   . Pneumonia   . PONV (postoperative nausea and vomiting)   . Sigmoid diverticulosis   . Thalassemia minor   . Vitamin D deficiency    Past Surgical History:  Procedure Laterality Date  . ATRIAL FIBRILLATION ABLATION N/A 09/21/2018   Procedure: ATRIAL FIBRILLATION ABLATION;  Surgeon: Thompson Grayer, MD;  Location: [place] CV LAB;  Service: Cardiovascular;  Laterality: N/A;  . BIOPSY  07/06/2018   Procedure: BIOPSY;  Surgeon: Clarene Essex, MD;  Location: [place];  Service: Endoscopy;;  . BREAST  LUMPECTOMY Left 05/28/2009  . BREAST SURGERY Left 10   lumpectomy  . CARDIOVERSION N/A 07/07/2018   Procedure: CARDIOVERSION;  Surgeon: [name], MD;  Location: [facility] ENDOSCOPY;  Service: Cardiovascular;  Laterality: N/A;  . CARDIOVERSION N/A 07/14/2018   Procedure: CARDIOVERSION;  Surgeon: Larey Dresser, MD;  Location: [facility] ENDOSCOPY;  Service: Cardiovascular;  Laterality: N/A;  . CESAREAN SECTION    . COLONOSCOPY WITH PROPOFOL N/A 07/06/2018   Procedure: COLONOSCOPY WITH PROPOFOL;  Surgeon: Clarene Essex, MD;  Location: [place];  Service: Endoscopy;  Laterality: N/A;  . CYSTECTOMY  70   pilonidal   . DILATION AND CURETTAGE OF UTERUS    . ESOPHAGOGASTRODUODENOSCOPY (EGD) WITH PROPOFOL N/A 07/06/2018   Procedure: ESOPHAGOGASTRODUODENOSCOPY (EGD) WITH PROPOFOL;  Surgeon: Clarene Essex, MD;  Location: [place];  Service: Endoscopy;  Laterality: N/A;  . EYE SURGERY Bilateral 12/14   cataracts  . HOT HEMOSTASIS N/A 07/06/2018   Procedure: HOT HEMOSTASIS (ARGON PLASMA COAGULATION/BICAP);  Surgeon: Clarene Essex, MD;  Location: [place];  Service: Endoscopy;  Laterality: N/A;  . MAXIMUM ACCESS (MAS)POSTERIOR LUMBAR INTERBODY FUSION (PLIF) 2 LEVEL N/A 09/22/2013   Procedure: FOR MAXIMUM ACCESS  POSTERIOR LUMBAR INTERBODY FUSION LUMBAR THREE-FOUR FOUR-FIVE;  Surgeon: Eustace Moore, MD;  Location: [place] NEURO ORS;  Service: Neurosurgery;  Laterality: N/A;  . OOPHORECTOMY  wedge section not rem  . 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 REPLACEMENT TOTAL KNEE    . TEE WITHOUT CARDIOVERSION N/A 07/07/2018   Procedure: TRANSESOPHAGEAL ECHOCARDIOGRAM (TEE);  Surgeon: [name], MD;  Location: [place];  Service: Cardiovascular;  Laterality: N/A;  . TEE WITHOUT CARDIOVERSION N/A 09/21/2018   Procedure: TRANSESOPHAGEAL ECHOCARDIOGRAM (TEE);  Surgeon: Acie Fredrickson Wonda Cheng, MD;  Location: [facility] ENDOSCOPY;  Service: Cardiovascular;  Laterality: N/A;  . TONSILLECTOMY    . TUBAL LIGATION       ROS- all systems are reviewed and negatives except as per HPI above  Current Outpatient Medications  Medication Sig Dispense Refill  . B Complex Vitamins (VITAMIN B COMPLEX PO) Take 1 tablet by mouth daily.    . Calcium Carbonate-Vitamin D (CALCIUM 600 + D PO) Take 1 tablet by mouth daily.     . Cholecalciferol (VITAMIN D3) 25 MCG (1000 UT) CAPS Take by mouth.    . diltiazem (CARDIZEM CD) 120 MG 24 hr capsule Take 1 capsule (120 mg total) by mouth daily. 90 capsule 2  . diphenhydramine-acetaminophen (TYLENOL PM) 25-500 MG TABS tablet Take 2 tablets by mouth as needed (sleep).     Marland Kitchen glimepiride (AMARYL) 2 MG tablet Take 2 mg by mouth daily with breakfast.   3  . metFORMIN (GLUCOPHAGE-XR) 500 MG 24 hr tablet Take 500 mg by mouth 2 (two) times daily.   3  . ONETOUCH ULTRA test strip 1 each by Other route daily.    . rosuvastatin (CRESTOR) 5 MG tablet Take 5 mg by mouth once a week. Pt states she is taking weekly per Dr. Inda Merlin.    . warfarin (COUMADIN) 4 MG tablet TAKE 1 TABLET DAILY EXCEPT ON Monday, Wednesday, Friday OR AS DIRECTED BY COUMADIN CLINIC 45 tablet 0  . warfarin (COUMADIN) 5 MG tablet TAKE AS DIRECTED BY COUMADIN CLINIC FOUR DAYS A WEEK 60 tablet 0   No current facility-administered medications for this visit.    Physical Exam: Vitals:   11/17/19 1455  BP: (!) 148/60  Pulse: 82  SpO2: 97%  Height: 5\' 5"  (1.651 m)    GEN- The patient is well appearing, alert and oriented x 3 today.   Head- normocephalic, atraumatic Eyes-  Sclera clear, conjunctiva pink Ears- hearing intact Oropharynx- clear Lungs- Clear to ausculation bilaterally, normal work of breathing Heart- Regular rate and rhythm, no murmurs, rubs or gallops, PMI not laterally displaced GI- soft, NT, ND, + BS Extremities- no clubbing, cyanosis, or edema  Wt Readings from Last 3 Encounters:  09/13/19 245 lb (111.1 kg)  10/19/18 249 lb (112.9 kg)  09/22/18 249 lb 4.8 oz (113.1 kg)    EKG tracing ordered  today is personally reviewed and shows sinus rhythm  Assessment and Plan:  1. Persistent atrial fibrillation Doing very well post ablation off AAD therapy chads2vasc score is 4.  She is on coumadin She has not been very aggressive with weight loss, exercise, diabetes control or management of her hypertension.  I am very surprised that she has not had more afib.  In order to remain in sinus rhythm long term, substantial efforts with each of these is advised.  2. Hypertensive cardiovascular disease Elevated BP today lifesyle modification advised I have advised initiation of losartan which has been showed to have positive reduction in atrial fibrosis and down stream afib.  She is not willing to start antihypertensive therapy today but would be willing to discuss this further with Dr Inda Merlin.  3. OSA  Followed by Dr Radford Pax She uses CPAP  4. Obesity Body mass index is 40.77 kg/m. Lifestyle modification is encouraged  5. HL She is on crestor  I would like her to follow closely with the afib clinic for dedicated attention on lifestyle modification AF clinic in 6 months I will see in a year She would be interested in the AF exercise program through [facility] with Justice Rocher.  I will refer her again.  Thompson Grayer MD, [facility] 11/17/2019 3:01 PM

## 2019-11-19 ENCOUNTER — Other Ambulatory Visit (HOSPITAL_COMMUNITY): Payer: Self-pay | Admitting: Cardiology

## 2019-11-22 ENCOUNTER — Other Ambulatory Visit: Payer: Self-pay

## 2019-11-22 ENCOUNTER — Ambulatory Visit (INDEPENDENT_AMBULATORY_CARE_PROVIDER_SITE_OTHER): Payer: Medicare PPO | Admitting: *Deleted

## 2019-11-22 ENCOUNTER — Telehealth: Payer: Self-pay

## 2019-11-22 DIAGNOSIS — Z5181 Encounter for therapeutic drug level monitoring: Secondary | ICD-10-CM | POA: Diagnosis not present

## 2019-11-22 DIAGNOSIS — I4891 Unspecified atrial fibrillation: Secondary | ICD-10-CM

## 2019-11-22 LAB — POCT INR: INR: 2.2 (ref 2.0–3.0)

## 2019-11-22 NOTE — Telephone Encounter (Signed)
Left message requesting call back to discuss referral to PREP. Will await call back

## 2019-11-22 NOTE — Patient Instructions (Signed)
Description   Continue taking Warfarin 5mg  daily, except for the 4 mg on Monday, Wednesday and Friday. Recheck INR in 6 weeks.  Coumadin Clinic [phone] Main [phone]

## 2019-11-24 ENCOUNTER — Telehealth: Payer: Self-pay

## 2019-11-24 NOTE — Telephone Encounter (Signed)
Call from patient/returning message/recalled patient.  Interested in PREP-was due to start last year when COVID shut the gym down.  Interested in [place] as a location not [place] due to distance. Would prefer morning. Wants to work out and lose weight. Referred by MD.  Bryan Lemma I have neither to offer right now as we are still working on limited capacity.  She is interested in virtual if she can learn how to do the webex Set up time for Friday at 930a to walk her thru webex as well as how class will run. Will send invite for M/W at 10am after appt tomorrow.

## 2019-11-25 ENCOUNTER — Telehealth (HOSPITAL_COMMUNITY): Payer: Self-pay | Admitting: *Deleted

## 2019-11-25 ENCOUNTER — Telehealth: Payer: Self-pay

## 2019-11-25 MED ORDER — LOSARTAN POTASSIUM 50 MG PO TABS: 50.0000 mg | ORAL_TABLET | Freq: Every day | ORAL | 3 refills | Status: DC

## 2019-11-25 NOTE — Telephone Encounter (Signed)
Call arranged to help patient sign into webex in preparation for virtual fitness classes.  Was able to navigate thru webex sign on with video and audio without much difficulty.  Explained how class will run as well as setup required (non-rolling chair, clear floor space, computer, handweights if she has them).  Patient mentioned not feeling well last pm worried that she was back in AFIB. Took BP via wrist cuff 160/71 pulse 86 (this am). Did not write down last pm's recording, doesn't know how to check memory on machine.  Doesn't regularly check BP. Reports she doesn't have HTN. Said that MD told her that [facility] mentioned that she needed to talk with PCP about starting losartan.  Her head felt big but no h/a, numbness/tingling or difficulty speaking.  Asked pt to follow up with PCP-sts she has an appt in May 8th (wellness) advised appt needs to be moved up to address HTN Plan:  Patient will f/u with Inda Merlin Will send message to [facility] (referral source) Will check in with patient next Friday for outcomes then start virtual fitness classes.   Patient phoned back-[place] office closed today so she will call AFIB clinic to see if she can get her BP checked and next steps.

## 2019-11-25 NOTE — Telephone Encounter (Signed)
Patient called in stating she continues to have elevated BP since seeing Dr. Rayann Heman. At the time he recommended starting losartan but pt was hesitant to start without discussing with her PCP. Now with continued elevation pt now ready to proceed -- will call in losartan 50mg  once a day. Pt will see Dr. Inda Merlin in 2 weeks for bp/bmet if unable to see Dr. Inda Merlin she will call back to be seen in afib clinic in 2 weeks. Pt in agreement.

## 2019-12-02 ENCOUNTER — Telehealth: Payer: Self-pay

## 2019-12-02 NOTE — Telephone Encounter (Signed)
Call placed to patient reference able to begin online fitness class.  Reports feeling much better and blood pressures ranging 120-130's over 65-75  Pulse runs a bit high at 80-100.  Feels ready to begin on Monday Explained process for invites and reminded to work at her own pace, take breaks as needed.  I will show modifications for movements and she can work at her own level.

## 2019-12-05 NOTE — Progress Notes (Signed)
Attended virtual fitness class today tolerating it well.  Will plan on attending on Wednesday as well.  Feels better with BP controlled.

## 2019-12-12 NOTE — Progress Notes (Signed)
Participated in virtual fitness class today for 40 min. Did really well. States BP is much better with systolic at [id number], doesn't recall diastolic was. Commented she has f/u with her primary care MD tomorrow.

## 2019-12-13 ENCOUNTER — Other Ambulatory Visit: Payer: Self-pay | Admitting: Internal Medicine

## 2019-12-13 DIAGNOSIS — E2839 Other primary ovarian failure: Secondary | ICD-10-CM

## 2019-12-15 ENCOUNTER — Ambulatory Visit: Payer: Medicare PPO | Admitting: Internal Medicine

## 2019-12-26 NOTE — Progress Notes (Signed)
Participated in virtual fitness today for about 35 min without issue.  Did yard work yesterday and was able to rake, garden without pain.  Attributes to the workouts she has been doing. Encouraged her to continue and to stretch.

## 2020-01-02 NOTE — Progress Notes (Signed)
Patient participated in virtual fitness class for 35 min tolerating well.  BP well controlled on meds, pt sts feeling well.

## 2020-01-03 ENCOUNTER — Ambulatory Visit (INDEPENDENT_AMBULATORY_CARE_PROVIDER_SITE_OTHER): Payer: Medicare PPO

## 2020-01-03 ENCOUNTER — Other Ambulatory Visit: Payer: Self-pay

## 2020-01-03 DIAGNOSIS — I4891 Unspecified atrial fibrillation: Secondary | ICD-10-CM | POA: Diagnosis not present

## 2020-01-03 DIAGNOSIS — Z5181 Encounter for therapeutic drug level monitoring: Secondary | ICD-10-CM

## 2020-01-03 LAB — POCT INR: INR: 2.4 (ref 2.0–3.0)

## 2020-01-03 NOTE — Patient Instructions (Signed)
Description   Continue taking Warfarin 5mg daily, except for the 4mg on Mondays, Wednesdays and Fridays. Recheck INR in 6 weeks. [facility] [phone] Main [phone]     

## 2020-01-04 NOTE — Progress Notes (Signed)
Complains of bilateral knee pain (on the inside of knee), achiness. Has been marching without shoes on, on brick flooring. Encouraged shoes during class and changing floors.  Did chair yoga and breathwork in place of regular circuit since that did not cause pain in knees as marching seated did.  Encouraged to stretch for tight hamstrings and chest/shoulder stretches for posture.

## 2020-01-09 NOTE — Progress Notes (Signed)
Reports knees feeling better with the addition of shoes even in house.  Virtual fitness for 30 min this am without issue

## 2020-01-16 NOTE — Progress Notes (Signed)
Virtual fitness class x 35 min.  Tolerated well. Knees have felt better with wear shoes.

## 2020-01-24 DIAGNOSIS — W57XXXA Bitten or stung by nonvenomous insect and other nonvenomous arthropods, initial encounter: Secondary | ICD-10-CM | POA: Diagnosis not present

## 2020-01-24 DIAGNOSIS — T63481A Toxic effect of venom of other arthropod, accidental (unintentional), initial encounter: Secondary | ICD-10-CM | POA: Diagnosis not present

## 2020-01-30 DIAGNOSIS — E1165 Type 2 diabetes mellitus with hyperglycemia: Secondary | ICD-10-CM | POA: Diagnosis not present

## 2020-01-30 DIAGNOSIS — G4733 Obstructive sleep apnea (adult) (pediatric): Secondary | ICD-10-CM | POA: Diagnosis not present

## 2020-01-30 DIAGNOSIS — Z1389 Encounter for screening for other disorder: Secondary | ICD-10-CM | POA: Diagnosis not present

## 2020-01-30 DIAGNOSIS — D6869 Other thrombophilia: Secondary | ICD-10-CM | POA: Diagnosis not present

## 2020-01-30 DIAGNOSIS — M1990 Unspecified osteoarthritis, unspecified site: Secondary | ICD-10-CM | POA: Diagnosis not present

## 2020-01-30 DIAGNOSIS — R358 Other polyuria: Secondary | ICD-10-CM | POA: Diagnosis not present

## 2020-01-30 DIAGNOSIS — Z0000 Encounter for general adult medical examination without abnormal findings: Secondary | ICD-10-CM | POA: Diagnosis not present

## 2020-01-30 DIAGNOSIS — R202 Paresthesia of skin: Secondary | ICD-10-CM | POA: Diagnosis not present

## 2020-01-30 DIAGNOSIS — M545 Low back pain: Secondary | ICD-10-CM | POA: Diagnosis not present

## 2020-01-30 DIAGNOSIS — Z79899 Other long term (current) drug therapy: Secondary | ICD-10-CM | POA: Diagnosis not present

## 2020-01-30 DIAGNOSIS — I4891 Unspecified atrial fibrillation: Secondary | ICD-10-CM | POA: Diagnosis not present

## 2020-01-30 DIAGNOSIS — E559 Vitamin D deficiency, unspecified: Secondary | ICD-10-CM | POA: Diagnosis not present

## 2020-01-30 DIAGNOSIS — M25561 Pain in right knee: Secondary | ICD-10-CM | POA: Diagnosis not present

## 2020-01-30 DIAGNOSIS — I10 Essential (primary) hypertension: Secondary | ICD-10-CM | POA: Diagnosis not present

## 2020-02-06 NOTE — Progress Notes (Signed)
Participated in virtual fitness class Reports BP 124/66, still having leg and back pain. Legs feel heavy. F/U with ortho for knees is Monday. Tolerated seated exercises without difficulty.

## 2020-02-13 DIAGNOSIS — Z96652 Presence of left artificial knee joint: Secondary | ICD-10-CM | POA: Diagnosis not present

## 2020-02-13 DIAGNOSIS — M545 Low back pain: Secondary | ICD-10-CM | POA: Diagnosis not present

## 2020-02-13 DIAGNOSIS — M25561 Pain in right knee: Secondary | ICD-10-CM | POA: Diagnosis not present

## 2020-02-13 DIAGNOSIS — M25562 Pain in left knee: Secondary | ICD-10-CM | POA: Diagnosis not present

## 2020-02-14 ENCOUNTER — Other Ambulatory Visit: Payer: Self-pay

## 2020-02-14 ENCOUNTER — Ambulatory Visit (INDEPENDENT_AMBULATORY_CARE_PROVIDER_SITE_OTHER): Payer: Medicare PPO | Admitting: *Deleted

## 2020-02-14 DIAGNOSIS — I4891 Unspecified atrial fibrillation: Secondary | ICD-10-CM | POA: Diagnosis not present

## 2020-02-14 DIAGNOSIS — Z5181 Encounter for therapeutic drug level monitoring: Secondary | ICD-10-CM | POA: Diagnosis not present

## 2020-02-14 LAB — POCT INR: INR: 2.2 (ref 2.0–3.0)

## 2020-02-14 NOTE — Patient Instructions (Addendum)
Description   Continue taking Warfarin 5mg daily, except for the 4mg on Mondays, Wednesdays and Fridays. Recheck INR in 6 weeks. [facility]in Clinic [phone] Main [phone]     

## 2020-02-16 ENCOUNTER — Other Ambulatory Visit (HOSPITAL_COMMUNITY): Payer: Self-pay | Admitting: *Deleted

## 2020-02-16 ENCOUNTER — Other Ambulatory Visit (HOSPITAL_COMMUNITY): Payer: Self-pay | Admitting: Cardiology

## 2020-02-16 ENCOUNTER — Other Ambulatory Visit (HOSPITAL_COMMUNITY): Payer: Self-pay | Admitting: Nurse Practitioner

## 2020-02-16 DIAGNOSIS — R3915 Urgency of urination: Secondary | ICD-10-CM | POA: Diagnosis not present

## 2020-02-16 DIAGNOSIS — R32 Unspecified urinary incontinence: Secondary | ICD-10-CM | POA: Diagnosis not present

## 2020-02-16 MED ORDER — LOSARTAN POTASSIUM 100 MG PO TABS
100.0000 mg | ORAL_TABLET | Freq: Every day | ORAL | 3 refills | Status: DC
Start: 2020-02-16 — End: 2020-10-15

## 2020-02-20 ENCOUNTER — Other Ambulatory Visit (HOSPITAL_COMMUNITY): Payer: Self-pay | Admitting: Cardiology

## 2020-02-20 NOTE — Progress Notes (Signed)
Attended virtual fitness class without issue.  Mentioned not sleeping well due to leg cramps over night.  Has them frequently? Keeps mustard by bed to manage. Thinks she didn't drink enough water yesterday and overnight.  Encouraged adding oranges/bananas nuts and seeds to diet to assist.

## 2020-02-22 ENCOUNTER — Ambulatory Visit
Admission: RE | Admit: 2020-02-22 | Discharge: 2020-02-22 | Disposition: A | Discharge status: Home / Self Care | Payer: Medicare PPO | Source: Ambulatory Visit | Attending: Internal Medicine | Admitting: Internal Medicine

## 2020-02-22 ENCOUNTER — Other Ambulatory Visit: Payer: Self-pay

## 2020-02-22 DIAGNOSIS — M85852 Other specified disorders of bone density and structure, left thigh: Secondary | ICD-10-CM | POA: Diagnosis not present

## 2020-02-22 DIAGNOSIS — E2839 Other primary ovarian failure: Secondary | ICD-10-CM

## 2020-02-22 DIAGNOSIS — Z780 Asymptomatic menopausal state: Secondary | ICD-10-CM | POA: Diagnosis not present

## 2020-03-05 DIAGNOSIS — G4733 Obstructive sleep apnea (adult) (pediatric): Secondary | ICD-10-CM | POA: Diagnosis not present

## 2020-03-12 NOTE — Progress Notes (Signed)
Participated in virtual fitness x 30 min Commented knee is still doing well 1 month out of injection Will reschedule her ortho appt since it's doing well.

## 2020-03-19 NOTE — Progress Notes (Signed)
Participated in virtual fitness class x 30 min  without issue.

## 2020-03-20 DIAGNOSIS — G4733 Obstructive sleep apnea (adult) (pediatric): Secondary | ICD-10-CM | POA: Diagnosis not present

## 2020-03-27 ENCOUNTER — Ambulatory Visit (INDEPENDENT_AMBULATORY_CARE_PROVIDER_SITE_OTHER): Payer: Medicare PPO | Admitting: Pharmacist

## 2020-03-27 ENCOUNTER — Other Ambulatory Visit: Payer: Self-pay

## 2020-03-27 DIAGNOSIS — I4891 Unspecified atrial fibrillation: Secondary | ICD-10-CM

## 2020-03-27 DIAGNOSIS — Z5181 Encounter for therapeutic drug level monitoring: Secondary | ICD-10-CM

## 2020-03-27 LAB — POCT INR: INR: 2.6 (ref 2.0–3.0)

## 2020-03-27 NOTE — Patient Instructions (Signed)
Description   Continue taking Warfarin 5mg daily, except for the 4mg on Mondays, Wednesdays and Fridays. Recheck INR in 6 weeks. [facility] [phone] Main [phone]     

## 2020-04-09 NOTE — Progress Notes (Signed)
Participated in 30 min of online fitness class without issues

## 2020-04-16 NOTE — Progress Notes (Signed)
Participated in virtual fitness class today without issue. Reports BP high with meds today at 145/76. Takes losarten. Per Epic Losarten is 100 mg 1x per day. Patient's own list says 2x per day. She will reach out to her MD office to double check.

## 2020-04-25 NOTE — Progress Notes (Signed)
Participated in virtual fitness class for about 30 min without issue.

## 2020-04-26 ENCOUNTER — Other Ambulatory Visit: Payer: Self-pay | Admitting: Internal Medicine

## 2020-04-26 DIAGNOSIS — Z1231 Encounter for screening mammogram for malignant neoplasm of breast: Secondary | ICD-10-CM

## 2020-04-30 NOTE — Progress Notes (Signed)
Participated in virtual fitness class today for about 30 min without issue

## 2020-05-08 ENCOUNTER — Other Ambulatory Visit: Payer: Self-pay

## 2020-05-08 ENCOUNTER — Ambulatory Visit (INDEPENDENT_AMBULATORY_CARE_PROVIDER_SITE_OTHER): Payer: Medicare PPO | Admitting: *Deleted

## 2020-05-08 DIAGNOSIS — Z5181 Encounter for therapeutic drug level monitoring: Secondary | ICD-10-CM

## 2020-05-08 DIAGNOSIS — I4891 Unspecified atrial fibrillation: Secondary | ICD-10-CM | POA: Diagnosis not present

## 2020-05-08 LAB — POCT INR: INR: 2.1 (ref 2.0–3.0)

## 2020-05-08 NOTE — Patient Instructions (Signed)
Description   Today take 7.5mg  (1 and 1/2 tablets of 5mg  tablet) then continue taking Warfarin 5mg  daily, except for the 4mg  on Mondays, Wednesdays and Fridays. Recheck INR in 6 weeks.  [facility] [phone] Main [phone]

## 2020-05-09 NOTE — Progress Notes (Signed)
Participated in 30 min of virtual exercise today without issue

## 2020-05-14 NOTE — Progress Notes (Signed)
Participated in virtual fitness class for 30 min without issue

## 2020-05-18 DIAGNOSIS — M1711 Unilateral primary osteoarthritis, right knee: Secondary | ICD-10-CM | POA: Diagnosis not present

## 2020-05-18 DIAGNOSIS — M25561 Pain in right knee: Secondary | ICD-10-CM | POA: Diagnosis not present

## 2020-05-31 ENCOUNTER — Ambulatory Visit
Admission: RE | Admit: 2020-05-31 | Discharge: 2020-05-31 | Disposition: A | Discharge status: Home / Self Care | Payer: Medicare PPO | Source: Ambulatory Visit | Attending: Internal Medicine | Admitting: Internal Medicine

## 2020-05-31 ENCOUNTER — Other Ambulatory Visit: Payer: Self-pay

## 2020-05-31 DIAGNOSIS — Z1231 Encounter for screening mammogram for malignant neoplasm of breast: Secondary | ICD-10-CM

## 2020-05-31 HISTORY — DX: Malignant neoplasm of unspecified site of unspecified female breast: C50.919

## 2020-06-04 DIAGNOSIS — G4733 Obstructive sleep apnea (adult) (pediatric): Secondary | ICD-10-CM | POA: Diagnosis not present

## 2020-06-11 NOTE — Progress Notes (Signed)
Participated in virtual exercise this am. BP running 112-146, 125/70 Still struggling with getting injections for knees authorized >10. Is going to change ortho MDs since office isn't sending documents to ins company for [place].  Will need to go get records from MD office.

## 2020-06-19 ENCOUNTER — Ambulatory Visit (INDEPENDENT_AMBULATORY_CARE_PROVIDER_SITE_OTHER): Payer: Medicare PPO | Admitting: Pharmacist

## 2020-06-19 ENCOUNTER — Other Ambulatory Visit: Payer: Self-pay

## 2020-06-19 DIAGNOSIS — I4891 Unspecified atrial fibrillation: Secondary | ICD-10-CM | POA: Diagnosis not present

## 2020-06-19 DIAGNOSIS — Z5181 Encounter for therapeutic drug level monitoring: Secondary | ICD-10-CM | POA: Diagnosis not present

## 2020-06-19 LAB — POCT INR: INR: 2.7 (ref 2.0–3.0)

## 2020-06-19 NOTE — Patient Instructions (Signed)
Description   Continue taking Warfarin 5mg daily, except for the 4mg on Mondays, Wednesdays and Fridays. Recheck INR in 6 weeks. [facility] [phone] Main [phone]     

## 2020-06-28 ENCOUNTER — Ambulatory Visit
Admission: RE | Admit: 2020-06-28 | Discharge: 2020-06-28 | Disposition: A | Discharge status: Home / Self Care | Payer: Medicare PPO | Source: Ambulatory Visit | Attending: Internal Medicine | Admitting: Internal Medicine

## 2020-06-28 ENCOUNTER — Other Ambulatory Visit: Payer: Self-pay

## 2020-06-28 DIAGNOSIS — Z1231 Encounter for screening mammogram for malignant neoplasm of breast: Secondary | ICD-10-CM | POA: Diagnosis not present

## 2020-07-06 ENCOUNTER — Telehealth: Payer: Self-pay | Admitting: Cardiology

## 2020-07-06 MED ORDER — WARFARIN SODIUM 4 MG PO TABS: ORAL_TABLET | ORAL | 0 refills | Status: DC

## 2020-07-06 MED ORDER — WARFARIN SODIUM 5 MG PO TABS: ORAL_TABLET | ORAL | 0 refills | Status: DC

## 2020-07-06 NOTE — Telephone Encounter (Addendum)
Prescription refill sent.

## 2020-07-06 NOTE — Telephone Encounter (Signed)
  *  STAT* If patient is at the pharmacy, call can be transferred to refill team.   1. Which medications need to be refilled? (please list name of each medication and dose if known)   warfarin (COUMADIN) 4 MG tablet    warfarin (COUMADIN) 5 MG tablet     2. Which pharmacy/location (including street and city if local pharmacy) is medication to be sent to? [id number] [place], [place]  3. Do they need a 30 day or 90 day supply? 90 days   Christina from [facility] called, she said all future refills needs to be send to [id number] [place], [place]. She also said if pt can be called to confirm refill

## 2020-07-27 DIAGNOSIS — M1711 Unilateral primary osteoarthritis, right knee: Secondary | ICD-10-CM | POA: Diagnosis not present

## 2020-07-31 ENCOUNTER — Other Ambulatory Visit: Payer: Self-pay

## 2020-07-31 ENCOUNTER — Ambulatory Visit (INDEPENDENT_AMBULATORY_CARE_PROVIDER_SITE_OTHER): Payer: Medicare PPO | Admitting: *Deleted

## 2020-07-31 DIAGNOSIS — Z5181 Encounter for therapeutic drug level monitoring: Secondary | ICD-10-CM

## 2020-07-31 DIAGNOSIS — I4891 Unspecified atrial fibrillation: Secondary | ICD-10-CM

## 2020-07-31 LAB — POCT INR: INR: 2.7 (ref 2.0–3.0)

## 2020-07-31 NOTE — Patient Instructions (Signed)
Description   Continue taking Warfarin 5mg  daily, except for the 4mg  on Mondays, Wednesdays and Fridays. Recheck INR in 6 weeks. Coumadin Clinic [phone] Main [phone]

## 2020-08-03 DIAGNOSIS — M1711 Unilateral primary osteoarthritis, right knee: Secondary | ICD-10-CM | POA: Diagnosis not present

## 2020-08-10 DIAGNOSIS — M1711 Unilateral primary osteoarthritis, right knee: Secondary | ICD-10-CM | POA: Diagnosis not present

## 2020-08-15 ENCOUNTER — Telehealth: Payer: Self-pay | Admitting: Internal Medicine

## 2020-08-15 DIAGNOSIS — E119 Type 2 diabetes mellitus without complications: Secondary | ICD-10-CM | POA: Diagnosis not present

## 2020-08-15 DIAGNOSIS — Z961 Presence of intraocular lens: Secondary | ICD-10-CM | POA: Diagnosis not present

## 2020-08-15 DIAGNOSIS — D492 Neoplasm of unspecified behavior of bone, soft tissue, and skin: Secondary | ICD-10-CM | POA: Diagnosis not present

## 2020-08-15 DIAGNOSIS — H35373 Puckering of macula, bilateral: Secondary | ICD-10-CM | POA: Diagnosis not present

## 2020-08-15 DIAGNOSIS — H43813 Vitreous degeneration, bilateral: Secondary | ICD-10-CM | POA: Diagnosis not present

## 2020-08-15 DIAGNOSIS — H35342 Macular cyst, hole, or pseudohole, left eye: Secondary | ICD-10-CM | POA: Diagnosis not present

## 2020-08-15 DIAGNOSIS — H0102A Squamous blepharitis right eye, upper and lower eyelids: Secondary | ICD-10-CM | POA: Diagnosis not present

## 2020-08-15 DIAGNOSIS — H0102B Squamous blepharitis left eye, upper and lower eyelids: Secondary | ICD-10-CM | POA: Diagnosis not present

## 2020-08-15 DIAGNOSIS — H04123 Dry eye syndrome of bilateral lacrimal glands: Secondary | ICD-10-CM | POA: Diagnosis not present

## 2020-08-15 NOTE — Telephone Encounter (Signed)
Summer is calling from Dr. Zenia Resides office wanting to know what type of reaction Jenna Mcdonald had to metoprolol. She states they were trying to prescribe a beta blocker eye drop for her and it advised she has had a reaction in the past. Please advise.

## 2020-08-15 NOTE — Telephone Encounter (Signed)
Allergy listed in patients chart for Metoprolol reaction is shortness of breath Diarrhea and fatigue.   Spoke to Summer and gave reactions. She will make Dr. Katy Fitch aware.

## 2020-09-03 DIAGNOSIS — G4733 Obstructive sleep apnea (adult) (pediatric): Secondary | ICD-10-CM | POA: Diagnosis not present

## 2020-09-13 ENCOUNTER — Other Ambulatory Visit: Payer: Self-pay

## 2020-09-13 ENCOUNTER — Ambulatory Visit (INDEPENDENT_AMBULATORY_CARE_PROVIDER_SITE_OTHER): Payer: Medicare PPO | Admitting: *Deleted

## 2020-09-13 ENCOUNTER — Encounter: Payer: Self-pay | Admitting: Cardiology

## 2020-09-13 ENCOUNTER — Ambulatory Visit (INDEPENDENT_AMBULATORY_CARE_PROVIDER_SITE_OTHER): Payer: Medicare PPO | Admitting: Cardiology

## 2020-09-13 VITALS — BP 140/54 | HR 84 | Ht 65.0 in | Wt 255.4 lb

## 2020-09-13 DIAGNOSIS — R072 Precordial pain: Secondary | ICD-10-CM

## 2020-09-13 DIAGNOSIS — I4891 Unspecified atrial fibrillation: Secondary | ICD-10-CM

## 2020-09-13 DIAGNOSIS — I350 Nonrheumatic aortic (valve) stenosis: Secondary | ICD-10-CM

## 2020-09-13 DIAGNOSIS — Z5181 Encounter for therapeutic drug level monitoring: Secondary | ICD-10-CM

## 2020-09-13 DIAGNOSIS — E119 Type 2 diabetes mellitus without complications: Secondary | ICD-10-CM | POA: Diagnosis not present

## 2020-09-13 DIAGNOSIS — G4733 Obstructive sleep apnea (adult) (pediatric): Secondary | ICD-10-CM

## 2020-09-13 DIAGNOSIS — I4819 Other persistent atrial fibrillation: Secondary | ICD-10-CM

## 2020-09-13 DIAGNOSIS — I2510 Atherosclerotic heart disease of native coronary artery without angina pectoris: Secondary | ICD-10-CM | POA: Diagnosis not present

## 2020-09-13 DIAGNOSIS — I10 Essential (primary) hypertension: Secondary | ICD-10-CM | POA: Diagnosis not present

## 2020-09-13 DIAGNOSIS — I2583 Coronary atherosclerosis due to lipid rich plaque: Secondary | ICD-10-CM

## 2020-09-13 LAB — POCT INR: INR: 2.8 (ref 2.0–3.0)

## 2020-09-13 NOTE — Patient Instructions (Addendum)
Medication Instructions:  Your physician recommends that you continue on your current medications as directed. Please refer to the Current Medication list given to you today.  *If you need a refill on your cardiac medications before your next appointment, please call your pharmacy*  Testing/Procedures: Your physician has requested that you have a lexiscan myoview. For further information please visit [email or web address]. Please follow instruction sheet, as given.  Your physician has requested that you have an echocardiogram. Echocardiography is a painless test that uses sound waves to create images of your heart. It provides your doctor with information about the size and shape of your heart and how well your heart's chambers and valves are working. This procedure takes approximately one hour. There are no restrictions for this procedure.  Follow-Up: At [facility], you and your health needs are our priority.  As part of our continuing mission to provide you with exceptional heart care, we have created designated Provider Care Teams.  These Care Teams include your primary Cardiologist (physician) and Advanced Practice Providers (APPs -  Physician Assistants and Nurse Practitioners) who all work together to provide you with the care you need, when you need it.  Your next appointment:   1 year(s)  The format for your next appointment:   In Person  Provider:   You may see Traci Turner, MD or one of the following Advanced Practice Providers on your designated Care Team:   Dayna Dunn, PA-C Michele Lenze, PA-C  

## 2020-09-13 NOTE — Patient Instructions (Signed)
Description   Continue taking Warfarin 5mg daily, except for the 4mg on Mondays, Wednesdays and Fridays. Recheck INR in 6 weeks. [facility] [phone] Main [phone]     

## 2020-09-13 NOTE — Addendum Note (Signed)
Addended by: Antonieta Iba on: 09/13/2020 10:19 AM   Modules accepted: Orders

## 2020-09-13 NOTE — Progress Notes (Addendum)
Cardiology Office Note:    Date:  09/13/2020   ID:  Jenna Mcdonald, DOB [birth year], MRN [id number]  PCP:  Josetta Huddle, MD  Cardiologist:  Fransico Him, MD    Referring MD: Josetta Huddle, MD   Chief Complaint  Patient presents with  . Sleep Apnea  . Aortic Stenosis  . Hypertension  . Atrial Fibrillation    History of Present Illness:    Jenna Mcdonald is a 75 y.o. female with a hx of OSA on BiPAP, HTN, PAF, mild AS by echo 1/2018and morbid obesity.  2D echo 08/2018 showed normal LVF with mild AS and mild MR.   She is here today for followup and is doing well.  She is having problems with DOE and her chest feels tight and then her heart starts beating fast.  This occurs with just walking down her driveway (160VP).  There is no radiation of the discomfort and no associated sx of nausea or diaphoresis. She denies any PND, orthopnea, LE edema, dizziness, palpitations or syncope. She is compliant with her meds and is tolerating meds with no SE.   She is doing well with her CPAP device and thinks that she has gotten used to it.  She tolerates the mask and feels the pressure is adequate.  Since going on CPAP she feels rested in the am for the most part if she wakes up too much during the night and has no significant daytime sleepiness.  She is having some problems with running nose and blood tinged nasal secretions.    She does not think that he snores.    Past Medical History:  Diagnosis Date  . Anemia   . Aortic stenosis    mild by echo 08/2016  . Breast CA ([place])    left  . Breast cancer ([place])   . Carotid stenosis    1-39% left  . Degenerative disc disease, lumbar    of the knees  . Diabetes mellitus   . 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[place])   . Fatigue    Severe secondary to to gemfibrozil  . Gastroesophageal reflux disease    denies  . GI bleeding    a. 06/2018: EGD showed multiple nonbleeding duodenal ulcers. Colonoscopy showed diverticulosis and multiple colonic  angiodysplastic lesion treated with argon plasma.  . Hammertoe    left second toe  . Hypercholesteremia   . Hypertension   . Insomnia   . Irritable bowel   . Low back pain   . Metabolic syndrome   . Morbid obesity ([place])   . OSA on CPAP \   bipap  . Osteoarthritis   . Osteopenia   . Persistent atrial fibrillation [facility])    s/p afib ablation - Dr. Rayann Heman 08/2018  . Personal history of radiation therapy   . Pneumonia   . PONV (postoperative nausea and vomiting)   . Sigmoid diverticulosis   . Thalassemia minor   . Vitamin D deficiency     Past Surgical History:  Procedure Laterality Date  . ATRIAL FIBRILLATION ABLATION N/A 09/21/2018   Procedure: ATRIAL FIBRILLATION ABLATION;  Surgeon: Thompson Grayer, MD;  Location: [place] CV LAB;  Service: Cardiovascular;  Laterality: N/A;  . BIOPSY  07/06/2018   Procedure: BIOPSY;  Surgeon: Clarene Essex, MD;  Location: [place];  Service: Endoscopy;;  . BREAST LUMPECTOMY Left 05/28/2009  . BREAST SURGERY Left 10   lumpectomy  . CARDIOVERSION N/A 07/07/2018   Procedure: CARDIOVERSION;  Surgeon: [name], MD;  Location: [facility]  ENDOSCOPY;  Service: Cardiovascular;  Laterality: N/A;  . CARDIOVERSION N/A 07/14/2018   Procedure: CARDIOVERSION;  Surgeon: Larey Dresser, MD;  Location: [facility] ENDOSCOPY;  Service: Cardiovascular;  Laterality: N/A;  . CESAREAN SECTION    . COLONOSCOPY WITH PROPOFOL N/A 07/06/2018   Procedure: COLONOSCOPY WITH PROPOFOL;  Surgeon: Clarene Essex, MD;  Location: [place];  Service: Endoscopy;  Laterality: N/A;  . CYSTECTOMY  70   pilonidal   . DILATION AND CURETTAGE OF UTERUS    . ESOPHAGOGASTRODUODENOSCOPY (EGD) WITH PROPOFOL N/A 07/06/2018   Procedure: ESOPHAGOGASTRODUODENOSCOPY (EGD) WITH PROPOFOL;  Surgeon: Clarene Essex, MD;  Location: [place];  Service: Endoscopy;  Laterality: N/A;  . EYE SURGERY Bilateral 12/14   cataracts  . HOT HEMOSTASIS N/A 07/06/2018   Procedure: HOT HEMOSTASIS (ARGON PLASMA  COAGULATION/BICAP);  Surgeon: Clarene Essex, MD;  Location: [place];  Service: Endoscopy;  Laterality: N/A;  . MAXIMUM ACCESS (MAS)POSTERIOR LUMBAR INTERBODY FUSION (PLIF) 2 LEVEL N/A 09/22/2013   Procedure: FOR MAXIMUM ACCESS  POSTERIOR LUMBAR INTERBODY FUSION LUMBAR THREE-FOUR FOUR-FIVE;  Surgeon: Eustace Moore, MD;  Location: [place] NEURO ORS;  Service: Neurosurgery;  Laterality: N/A;  . OOPHORECTOMY     wedge section not rem  . 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 REPLACEMENT TOTAL KNEE    . TEE WITHOUT CARDIOVERSION N/A 07/07/2018   Procedure: TRANSESOPHAGEAL ECHOCARDIOGRAM (TEE);  Surgeon: [name], MD;  Location: [place];  Service: Cardiovascular;  Laterality: N/A;  . TEE WITHOUT CARDIOVERSION N/A 09/21/2018   Procedure: TRANSESOPHAGEAL ECHOCARDIOGRAM (TEE);  Surgeon: Acie Fredrickson Wonda Cheng, MD;  Location: [facility] ENDOSCOPY;  Service: Cardiovascular;  Laterality: N/A;  . TONSILLECTOMY    . TUBAL LIGATION      Current Medications: Current Meds  Medication Sig  . Calcium Carbonate-Vitamin D (CALCIUM 600 + D PO) Take 1 tablet by mouth daily.   . celecoxib (CELEBREX) 200 MG capsule Take 200 mg by mouth daily.  . Cholecalciferol (VITAMIN D3) 25 MCG (1000 UT) CAPS Take by mouth.  . diclofenac Sodium (VOLTAREN) 1 % GEL Apply 4 g topically daily.  Marland Kitchen diltiazem (CARDIZEM CD) 120 MG 24 hr capsule Take 1 capsule (120 mg total) by mouth daily.  . diphenhydramine-acetaminophen (TYLENOL PM) 25-500 MG TABS tablet Take 2 tablets by mouth as needed (sleep).   Marland Kitchen glimepiride (AMARYL) 2 MG tablet Take 2 mg by mouth daily with breakfast.   . metFORMIN (GLUCOPHAGE-XR) 500 MG 24 hr tablet Take 500 mg by mouth 2 (two) times daily.   Glory Rosebush ULTRA test strip 1 each by Other route daily.  . rosuvastatin (CRESTOR) 5 MG tablet Take 5 mg by mouth once a week. Pt states she is taking weekly per Dr. Inda Merlin.  . warfarin (COUMADIN) 4 MG tablet Take on Monday, Wednesday and Friday as directed by the coumadin clinic   . warfarin (COUMADIN) 5 MG tablet Take on Sunday, Tuesday, Thursday, and Saturday or take as directed by the coumadin clinic.     Allergies:   Metoprolol, Gemfibrozil, Other, Tamoxifen, Tetracyclines & related, Toprol xl [metoprolol tartrate], Verapamil, Vicodin [hydrocodone-acetaminophen], and Aromasin [exemestane]   Social History   Socioeconomic History  . Marital status: Single    Spouse name: Not on file  . Number of children: Not on file  . Years of education: Not on file  . Highest education level: Not on file  Occupational History  . Not on file  Tobacco Use  . Smoking status: Former Smoker    Packs/day: 0.50  Years: 10.00    Pack years: 5.00    Types: Cigarettes    Quit date: 11/12/2003    Years since quitting: 16.8  . Smokeless tobacco: Never Used  Vaping Use  . Vaping Use: Never used  Substance and Sexual Activity  . Alcohol use: Yes    Comment: occasionally  . Drug use: No  . Sexual activity: Never    Birth control/protection: Post-menopausal  Other Topics Concern  . Not on file  Social History Narrative  . Not on file   Social Determinants of Health   Financial Resource Strain: Not on file  Food Insecurity: Not on file  Transportation Needs: Not on file  Physical Activity: Not on file  Stress: Not on file  Social Connections: Not on file     Family History: The patient's family history includes Anesthesia problems in her mother; Diabetes in her father and mother.  ROS:   Please see the history of present illness.    ROS  All other systems reviewed and negative.   EKGs/Labs/Other Studies Reviewed:    The following studies were reviewed today: PAP compliance download  EKG:  EKG is not ordered today.   Recent Labs: No results found for requested labs within last 8760 hours.   Recent Lipid Panel    Component Value Date/Time   CHOL  05/14/2008 0435    146        ATP III CLASSIFICATION:  <200     mg/dL   Desirable  200-239  mg/dL    Borderline High  >=240    mg/dL   High   TRIG 105 05/14/2008 0435   HDL 27 (L) 05/14/2008 0435   CHOLHDL 5.4 05/14/2008 0435   VLDL 21 05/14/2008 0435   LDLCALC  05/14/2008 0435    98        Total Cholesterol/HDL:CHD Risk Coronary Heart Disease Risk Table                     Men   Women  1/2 Average Risk   3.4   3.3    Physical Exam:    VS:  BP (!) 140/54   Pulse 84   Ht 5\' 5"  (1.651 m)   Wt 255 lb 6.4 oz (115.8 kg)   SpO2 94%   BMI 42.50 kg/m     Wt Readings from Last 3 Encounters:  09/13/20 255 lb 6.4 oz (115.8 kg)  09/13/19 245 lb (111.1 kg)  10/19/18 249 lb (112.9 kg)     GEN:  Well nourished, well developed in no acute distress HEENT: Normal NECK: No JVD LYMPHATICS: No lymphadenopathy CARDIAC: RRR, no rubs, gallops.  2/6 SM at RUSB radiating into the carotid arteries bilaterally RESPIRATORY:  Clear to auscultation without rales, wheezing or rhonchi  ABDOMEN: Soft, non-tender, non-distended MUSCULOSKELETAL:  No edema; No deformity  SKIN: Warm and dry NEUROLOGIC:  Alert and oriented x 3 PSYCHIATRIC:  Normal affect   ASSESSMENT:    1. OSA (obstructive sleep apnea)   2. Nonrheumatic aortic valve stenosis   3. Essential hypertension   4. Persistent atrial fibrillation ([place])   5. Morbid obesity ([place])   6. Type 2 diabetes mellitus without complication, without long-term current use of insulin (HCC)   7. Coronary artery disease due to lipid rich plaque    PLAN:    In order of problems listed above:  1. OSA - The patient is tolerating PAP therapy well without any problems. The PAP download was  reviewed today and showed an AHI of 3.1/hr on 12/8 cm H2O with 97% compliance in using more than 4 hours nightly.  The patient has been using and benefiting from PAP use and will continue to benefit from therapy.   2.  Aortic stenosis -2D echo 08/2018 showed very mild AS with mean AVG 44mmHg. -repeat echo given DOE to make sure that her AS has not progressed  3.   HTN -Bp is adequately controlled on exam today -continue Cardizem CD 120mg  daily and Losartan 100mg  daily  4.  PAF -followed by Dr. Rayann Heman -s/p afib ablation 08/2018 -she is maintaining NSR and no sx of recurrent afib -continue Cardizem and warfarin -she denies any bleeding problems on warfarin  5.  Obesity -I have encouraged her to get into a routine exercise program and cut back on carbs and portions.  -her exercise is limited by back pain  6.  Type 2 DM -followed by PCP -continue metformin 500mg  BID and Glimepiride  7.  Chest pain/ASCAD -she is having chest tightness and DOE with minimal exertion walking down her driveway -she has known CAD with coronary CTA 08/2018 showing 25-49% LAD stenosis -I have recommended a Lexiscan myoview to rule out ischemia -Shared Decision Making/Informed Consent{ The risks [chest pain, shortness of breath, cardiac arrhythmias, dizziness, blood pressure fluctuations, myocardial infarction, stroke/transient ischemic attack, nausea, vomiting, allergic reaction, radiation exposure, metallic taste sensation and life-threatening complications (estimated to be 1 in 10,000)], benefits (risk stratification, diagnosing coronary artery disease, treatment guidance) and alternatives of a nuclear stress test were discussed in detail with Ms. Lasyone and she agrees to proceed. -no ASA due Warfarin -continue statin  Medication Adjustments/Labs and Tests Ordered: Current medicines are reviewed at length with the patient today.  Concerns regarding medicines are outlined above.  No orders of the defined types were placed in this encounter.  No orders of the defined types were placed in this encounter.   Signed, Fransico Him, MD  09/13/2020 10:13 AM    [place]

## 2020-09-17 DIAGNOSIS — Z20828 Contact with and (suspected) exposure to other viral communicable diseases: Secondary | ICD-10-CM | POA: Diagnosis not present

## 2020-09-18 ENCOUNTER — Other Ambulatory Visit: Payer: Self-pay

## 2020-09-18 DIAGNOSIS — R079 Chest pain, unspecified: Secondary | ICD-10-CM

## 2020-09-19 DIAGNOSIS — D2310 Other benign neoplasm of skin of unspecified eyelid, including canthus: Secondary | ICD-10-CM | POA: Diagnosis not present

## 2020-09-19 DIAGNOSIS — D492 Neoplasm of unspecified behavior of bone, soft tissue, and skin: Secondary | ICD-10-CM | POA: Diagnosis not present

## 2020-09-20 NOTE — Addendum Note (Signed)
Addended by: Sueanne Margarita on: 09/20/2020 01:35 PM   Modules accepted: Orders

## 2020-09-27 ENCOUNTER — Telehealth (HOSPITAL_COMMUNITY): Payer: Self-pay | Admitting: *Deleted

## 2020-09-27 ENCOUNTER — Telehealth: Payer: Self-pay | Admitting: Cardiology

## 2020-09-27 NOTE — Telephone Encounter (Signed)
Patient given detailed instructions per Myocardial Perfusion Study Information Sheet for the test on 10/03/20. Patient notified to arrive 15 minutes early and that it is imperative to arrive on time for appointment to keep from having the test rescheduled.  If you need to cancel or reschedule your appointment, please call the office within 24 hours of your appointment. . Patient verbalized understanding. Felice Deem Jacqueline    

## 2020-09-27 NOTE — Telephone Encounter (Signed)
Patient states [employer] has the location for the pre authorization for her stress test for [facility]. She states they told her it needs to be changed to [place]. Please call patient back when this has been changed.

## 2020-09-28 ENCOUNTER — Other Ambulatory Visit: Payer: Self-pay | Admitting: Cardiology

## 2020-10-03 ENCOUNTER — Other Ambulatory Visit: Payer: Self-pay

## 2020-10-03 ENCOUNTER — Ambulatory Visit (HOSPITAL_COMMUNITY): Payer: Medicare PPO | Attending: Internal Medicine

## 2020-10-03 DIAGNOSIS — R072 Precordial pain: Secondary | ICD-10-CM | POA: Diagnosis not present

## 2020-10-03 MED ORDER — TECHNETIUM TC 99M TETROFOSMIN IV KIT
32.7000 | PACK | Freq: Once | INTRAVENOUS | Status: AC | PRN
  Administered 2020-10-03: 32.7 via INTRAVENOUS
  Filled 2020-10-03: qty 33

## 2020-10-03 MED ORDER — REGADENOSON 0.4 MG/5ML IV SOLN
0.4000 mg | Freq: Once | INTRAVENOUS | Status: AC
  Administered 2020-10-03: 0.4 mg via INTRAVENOUS

## 2020-10-04 ENCOUNTER — Ambulatory Visit (HOSPITAL_COMMUNITY): Payer: Medicare PPO | Attending: Cardiology

## 2020-10-04 LAB — MYOCARDIAL PERFUSION IMAGING
LV dias vol: 66 mL (ref 46–106)
LV sys vol: 41 mL
Peak HR: 99 {beats}/min
Rest HR: 83 {beats}/min
SDS: 2
SRS: 0
SSS: 2
TID: 0.98

## 2020-10-04 MED ORDER — TECHNETIUM TC 99M TETROFOSMIN IV KIT
31.5000 | PACK | Freq: Once | INTRAVENOUS | Status: AC | PRN
  Administered 2020-10-04: 31.5 via INTRAVENOUS
  Filled 2020-10-04: qty 32

## 2020-10-08 DIAGNOSIS — M25561 Pain in right knee: Secondary | ICD-10-CM | POA: Diagnosis not present

## 2020-10-08 DIAGNOSIS — H0102A Squamous blepharitis right eye, upper and lower eyelids: Secondary | ICD-10-CM | POA: Diagnosis not present

## 2020-10-08 DIAGNOSIS — H0102B Squamous blepharitis left eye, upper and lower eyelids: Secondary | ICD-10-CM | POA: Diagnosis not present

## 2020-10-09 ENCOUNTER — Other Ambulatory Visit: Payer: Self-pay

## 2020-10-09 ENCOUNTER — Ambulatory Visit (HOSPITAL_COMMUNITY): Payer: Medicare PPO | Attending: Cardiovascular Disease

## 2020-10-09 DIAGNOSIS — I350 Nonrheumatic aortic (valve) stenosis: Secondary | ICD-10-CM

## 2020-10-09 LAB — ECHOCARDIOGRAM COMPLETE
AR max vel: 2.59 cm2
AV Area VTI: 3.28 cm2
AV Area mean vel: 2.55 cm2
AV Mean grad: 11.3 mmHg
AV Peak grad: 19 mmHg
Ao pk vel: 2.18 m/s
Area-P 1/2: 3.77 cm2
S' Lateral: 1.8 cm

## 2020-10-10 ENCOUNTER — Ambulatory Visit (HOSPITAL_COMMUNITY)
Admission: RE | Admit: 2020-10-10 | Discharge: 2020-10-10 | Disposition: A | Discharge status: Home / Self Care | Payer: Medicare PPO | Source: Ambulatory Visit | Attending: Nurse Practitioner | Admitting: Nurse Practitioner

## 2020-10-10 ENCOUNTER — Encounter (HOSPITAL_COMMUNITY): Payer: Self-pay | Admitting: Nurse Practitioner

## 2020-10-10 VITALS — BP 196/58 | HR 89 | Ht 65.0 in | Wt 253.6 lb

## 2020-10-10 DIAGNOSIS — I10 Essential (primary) hypertension: Secondary | ICD-10-CM | POA: Diagnosis not present

## 2020-10-10 DIAGNOSIS — I4819 Other persistent atrial fibrillation: Secondary | ICD-10-CM | POA: Diagnosis not present

## 2020-10-10 DIAGNOSIS — Z87891 Personal history of nicotine dependence: Secondary | ICD-10-CM | POA: Insufficient documentation

## 2020-10-10 DIAGNOSIS — I4949 Other premature depolarization: Secondary | ICD-10-CM

## 2020-10-10 DIAGNOSIS — Z7901 Long term (current) use of anticoagulants: Secondary | ICD-10-CM | POA: Diagnosis not present

## 2020-10-10 DIAGNOSIS — D6869 Other thrombophilia: Secondary | ICD-10-CM

## 2020-10-10 DIAGNOSIS — R0602 Shortness of breath: Secondary | ICD-10-CM | POA: Insufficient documentation

## 2020-10-10 DIAGNOSIS — Z79899 Other long term (current) drug therapy: Secondary | ICD-10-CM | POA: Diagnosis not present

## 2020-10-10 MED ORDER — DILTIAZEM HCL ER COATED BEADS 120 MG PO CP24
120.0000 mg | ORAL_CAPSULE | Freq: Two times a day (BID) | ORAL | 2 refills | Status: DC
Start: 2020-10-10 — End: 2020-10-23

## 2020-10-10 NOTE — Patient Instructions (Signed)
Increase cardizem (diltiazem) to 120mg  twice a day

## 2020-10-10 NOTE — Progress Notes (Signed)
Primary Care Physician: Josetta Huddle, MD Referring Physician: [facility] f/u EP:Dr. Allred Cardiologist: Dr.Turner   Jenna Mcdonald is a 75 y.o. female with a h/o persistent afib that was  hospitalized 07/20/18  for failure of flecainide to maintain  SR. She was started on amiodarone. Dr. Rayann Heman consulted and thought pt may be an ablation candidate.  She is on warfarin.   I last saw pt  10/19/18. She was s/p ablation x one month. She reports that she had done exceptionally well. She had not noted any afib. She was having however, some GI issues that she thought was from amiodarone and stopped drug.   I am now seeing pt, 10/10/20, at the request of Dr. Radford Pax. She was having an echo yesterday and the echo tech noted irregular heart beat and reported she was in afib, but I feel it  was the premature beats , not afib. Ekg  today shows SR with frequent PVC's. She recently had a stress test and echo for reporting to Dr. Radford Pax that she is short of breath walking to her mailbox with pressure in her chest for the last last several weeks. PC's were noted at time of stress test as well.  She reports that she has gained around 20 lbs over the last year. Her BP is very elevated today. She checks very rarely;y at home. Her stress test did not show any ischemia but EF was reported moderately decreased. Dr. Radford Pax thought it was 2/2 to the PC's and wanted to see EF from echo, results  are pending from yesterday. She has OSA and is using CPAP.   Today, she denies symptoms of palpitations, chest pain, shortness of breath, orthopnea, PND, lower extremity edema, dizziness, presyncope, syncope, or neurologic sequela. The patient is tolerating medications without difficulties and is otherwise without complaint today.   Past Medical History:  Diagnosis Date  . Anemia   . Aortic stenosis    mild by echo 08/2016  . Breast CA ([place])    left  . Breast cancer ([place])   . Carotid stenosis    1-39% left  .  Degenerative disc disease, lumbar    of the knees  . Diabetes mellitus   . 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[place])   . Fatigue    Severe secondary to to gemfibrozil  . Gastroesophageal reflux disease    denies  . GI bleeding    a. 06/2018: EGD showed multiple nonbleeding duodenal ulcers. 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  . Hammertoe    left second toe  . Hypercholesteremia   . Hypertension   . Insomnia   . Irritable bowel   . Low back pain   . Metabolic syndrome   . Morbid obesity ([place])   . OSA on CPAP \   bipap  . Osteoarthritis   . Osteopenia   . Persistent atrial fibrillation [facility])    s/p afib ablation - Dr. Rayann Heman 08/2018  . Personal history of radiation therapy   . Pneumonia   . PONV (postoperative nausea and vomiting)   . Sigmoid diverticulosis   . Thalassemia minor   . Vitamin D deficiency    Past Surgical History:  Procedure Laterality Date  . ATRIAL FIBRILLATION ABLATION N/A 09/21/2018   Procedure: ATRIAL FIBRILLATION ABLATION;  Surgeon: Thompson Grayer, MD;  Location: [place] CV LAB;  Service: Cardiovascular;  Laterality: N/A;  . BIOPSY  07/06/2018   Procedure: BIOPSY;  Surgeon: Clarene Essex, MD;  Location:  [place] ENDOSCOPY;  Service: Endoscopy;;  . BREAST LUMPECTOMY Left 05/28/2009  . BREAST SURGERY Left 10   lumpectomy  . CARDIOVERSION N/A 07/07/2018   Procedure: CARDIOVERSION;  Surgeon: [name], MD;  Location: [facility] ENDOSCOPY;  Service: Cardiovascular;  Laterality: N/A;  . CARDIOVERSION N/A 07/14/2018   Procedure: CARDIOVERSION;  Surgeon: Larey Dresser, MD;  Location: [facility] ENDOSCOPY;  Service: Cardiovascular;  Laterality: N/A;  . CESAREAN SECTION    . COLONOSCOPY WITH PROPOFOL N/A 07/06/2018   Procedure: COLONOSCOPY WITH PROPOFOL;  Surgeon: Clarene Essex, MD;  Location: [place];  Service: Endoscopy;  Laterality: N/A;  . CYSTECTOMY  70   pilonidal   . DILATION AND CURETTAGE OF UTERUS    .  ESOPHAGOGASTRODUODENOSCOPY (EGD) WITH PROPOFOL N/A 07/06/2018   Procedure: ESOPHAGOGASTRODUODENOSCOPY (EGD) WITH PROPOFOL;  Surgeon: Clarene Essex, MD;  Location: [place];  Service: Endoscopy;  Laterality: N/A;  . EYE SURGERY Bilateral 12/14   cataracts  . HOT HEMOSTASIS N/A 07/06/2018   Procedure: HOT HEMOSTASIS (ARGON PLASMA COAGULATION/BICAP);  Surgeon: Clarene Essex, MD;  Location: [place];  Service: Endoscopy;  Laterality: N/A;  . MAXIMUM ACCESS (MAS)POSTERIOR LUMBAR INTERBODY FUSION (PLIF) 2 LEVEL N/A 09/22/2013   Procedure: FOR MAXIMUM ACCESS  POSTERIOR LUMBAR INTERBODY FUSION LUMBAR THREE-FOUR FOUR-FIVE;  Surgeon: Eustace Moore, MD;  Location: [place] NEURO ORS;  Service: Neurosurgery;  Laterality: N/A;  . OOPHORECTOMY     wedge section not rem  . 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 REPLACEMENT TOTAL KNEE    . TEE WITHOUT CARDIOVERSION N/A 07/07/2018   Procedure: TRANSESOPHAGEAL ECHOCARDIOGRAM (TEE);  Surgeon: [name], MD;  Location: [place];  Service: Cardiovascular;  Laterality: N/A;  . TEE WITHOUT CARDIOVERSION N/A 09/21/2018   Procedure: TRANSESOPHAGEAL ECHOCARDIOGRAM (TEE);  Surgeon: Acie Fredrickson Wonda Cheng, MD;  Location: [facility] ENDOSCOPY;  Service: Cardiovascular;  Laterality: N/A;  . TONSILLECTOMY    . TUBAL LIGATION      Current Outpatient Medications  Medication Sig Dispense Refill  . Calcium Carbonate-Vitamin D (CALCIUM 600 + D PO) Take 1 tablet by mouth daily.     . celecoxib (CELEBREX) 200 MG capsule Take 200 mg by mouth daily.    . Cholecalciferol (VITAMIN D3) 25 MCG (1000 UT) CAPS Take by mouth.    . diclofenac Sodium (VOLTAREN) 1 % GEL Apply 4 g topically daily.    . diphenhydramine-acetaminophen (TYLENOL PM) 25-500 MG TABS tablet Take 2 tablets by mouth as needed (sleep).     Marland Kitchen glimepiride (AMARYL) 2 MG tablet Take 2 mg by mouth daily with breakfast.   3  . losartan (COZAAR) 100 MG tablet Take 1 tablet (100 mg total) by mouth daily. 30 tablet 3  . metFORMIN  (GLUCOPHAGE-XR) 500 MG 24 hr tablet Take 500 mg by mouth 2 (two) times daily.   3  . ONETOUCH ULTRA test strip 1 each by Other route daily.    . ORTHOVISC 30 MG/2ML SOSY 1 syringe intraarticularly once a week    . rosuvastatin (CRESTOR) 5 MG tablet Take 5 mg by mouth once a week. Pt states she is taking weekly per Dr. Inda Merlin.    . warfarin (COUMADIN) 4 MG tablet TAKE ON MONDAY, WEDNESDAY AND FRIDAY AS DIRECTED BY THE COUMADIN CLINIC 45 tablet 1  . warfarin (COUMADIN) 5 MG tablet TAKE ON SUNDAY, TUESDAY, THURSDAY, AND SATURDAY OR TAKE AS DIRECTED BY THE COUMADIN CLINIC. 60 tablet 1  . diltiazem (CARDIZEM CD) 120 MG 24 hr capsule Take 1 capsule (120 mg total) by mouth  in the morning and at bedtime. 90 capsule 2   No current facility-administered medications for this encounter.    Allergies  Allergen Reactions  . Metoprolol Shortness Of Breath, Diarrhea and Other (See Comments)    Fatigue   . Gemfibrozil Other (See Comments)    Severe fatigue  . Other Other (See Comments)    Numerous cancer drugs - unknown reaction  . Tamoxifen Other (See Comments)    Bone and body aches   . Tetracyclines & Related Other (See Comments)    Vaginal infections    . Toprol Xl [Metoprolol Tartrate] Other (See Comments)    Hair loss  . Verapamil Other (See Comments)    Unknown  . Vicodin [Hydrocodone-Acetaminophen] Nausea And Vomiting and Other (See Comments)    Patient reports terrible nausea; plain tylenol is ok with patient  . Aromasin [Exemestane] Other (See Comments)    Hair loss     Social History   Socioeconomic History  . Marital status: Single    Spouse name: Not on file  . Number of children: Not on file  . Years of education: Not on file  . Highest education level: Not on file  Occupational History  . Not on file  Tobacco Use  . Smoking status: Former Smoker    Packs/day: 0.50    Years: 10.00    Pack years: 5.00    Types: Cigarettes    Quit date: 11/12/2003    Years since  quitting: 16.9  . Smokeless tobacco: Never Used  Vaping Use  . Vaping Use: Never used  Substance and Sexual Activity  . Alcohol use: Yes    Comment: occasionally  . Drug use: No  . Sexual activity: Never    Birth control/protection: Post-menopausal  Other Topics Concern  . Not on file  Social History Narrative  . Not on file   Social Determinants of Health   Financial Resource Strain: Not on file  Food Insecurity: Not on file  Transportation Needs: Not on file  Physical Activity: Not on file  Stress: Not on file  Social Connections: Not on file  Intimate Partner Violence: Not on file    Family History  Problem Relation Age of Onset  . Anesthesia problems Mother   . Diabetes Mother   . Diabetes Father     ROS- All systems are reviewed and negative except as per the HPI above  Physical Exam: Vitals:   10/10/20 0929  BP: (!) 196/58  Pulse: 89  Weight: 115 kg  Height: 5\' 5"  (1.651 m)   Wt Readings from Last 3 Encounters:  10/10/20 115 kg  10/03/20 115.7 kg  09/13/20 115.8 kg    Labs: Lab Results  Component Value Date   NA 140 09/21/2018   K 4.5 09/21/2018   CL 104 09/21/2018   CO2 23 09/21/2018   GLUCOSE 120 (H) 09/21/2018   BUN 22 09/21/2018   CREATININE 1.09 (H) 09/21/2018   CALCIUM 9.4 09/21/2018   MG 1.8 07/21/2018   Lab Results  Component Value Date   INR 2.8 09/13/2020   Lab Results  Component Value Date   CHOL  05/14/2008    146        ATP III CLASSIFICATION:  <200     mg/dL   Desirable  200-239  mg/dL   Borderline High  >=240    mg/dL   High   HDL 27 (L) 05/14/2008   LDLCALC  05/14/2008    98  Total Cholesterol/HDL:CHD Risk Coronary Heart Disease Risk Table                     Men   Women  1/2 Average Risk   3.4   3.3   TRIG 105 05/14/2008     GEN- The patient is well appearing, alert and oriented x 3 today.   Head- normocephalic, atraumatic Eyes-  Sclera clear, conjunctiva pink Ears- hearing intact Oropharynx-  clear Neck- supple, no JVP Lymph- no cervical lymphadenopathy Lungs- Clear to ausculation bilaterally, normal work of breathing Heart-  irregular rate and rhythm, no murmurs, rubs or gallops, PMI not laterally displaced GI- soft, NT, ND, + BS Extremities- no clubbing, cyanosis, or edema MS- no significant deformity or atrophy Skin- no rash or lesion Psych- euthymic mood, full affect Neuro- strength and sensation are intact  EKG- NSR at 89 bpm, with trigeminy PVC's, pr int 146 ms, qrs int 84 ms, qtc 455 ms   stre Lexi myoview- 10/04/20- Study Highlights    The left ventricular ejection fraction is moderately decreased (30-44%).  Nuclear stress EF: 38%.  There was no ST segment deviation noted during stress.  The study is normal.  This is a low risk study.   Normal pharmacologic nuclear stress test with no evidence for prior infarct or ischemia. Very frequent PACs and short runs of atrial tachycardia. LVEF appears underestimated, correlation with an echocardiogram is recommended.  Echo- 10/09/20- Dr. Radford Pax has not signed off yet-  1. Left ventricular ejection fraction, by estimation, is 70 to 75%. The  left ventricle has hyperdynamic function. The left ventricle has no  regional wall motion abnormalities. There is mild left ventricular  hypertrophy. Left ventricular diastolic  parameters are consistent with Grade II diastolic dysfunction  (pseudonormalization).  2. Right ventricular systolic function is normal. The right ventricular  size is normal. There is normal pulmonary artery systolic pressure.  3. Left atrial size was moderately dilated.  4. The mitral valve is normal in structure. Trivial mitral valve  regurgitation. No evidence of mitral stenosis.  5. The aortic valve is grossly normal. Aortic valve regurgitation is not  visualized. Mild aortic valve stenosis.     Assessment and Plan: 1. Persistent afib S/p ablation x 6 months and doing very well staying  in SR She has had premature contractions noted during recent  stress test/echo and today on EKG Increase diltiazem to 120 mg bid   2. HTN Poorly controlled Increase in diltiazem should help, take the extra dose when she arrives home  Encouraged to check at home Avoid salt intake  Weight gain is probably aggravating, has gained 20 lbs over the last year  Weight loss encouraged   3. Exertional symptoms of shortness of breath and chest heaviness  Recent stress test did not show ischemia, diastolic dysfunction by echo  Again weight gain may be contributing to symptoms  Per Dr. Radford Pax   4. CHA2DS2VASc score of at least 6  Continue  warfarin   I will see back in one week    Butch Penny C. Eriona Kinchen, [facility] [address] [place], [place] [address] [phone]

## 2020-10-12 ENCOUNTER — Emergency Department (HOSPITAL_COMMUNITY): Payer: BC Managed Care – PPO

## 2020-10-12 ENCOUNTER — Telehealth (HOSPITAL_COMMUNITY): Payer: Self-pay | Admitting: *Deleted

## 2020-10-12 ENCOUNTER — Emergency Department (HOSPITAL_COMMUNITY)
Admission: EM | Admit: 2020-10-12 | Discharge: 2020-10-12 | Disposition: A | Discharge status: Home / Self Care | Payer: BC Managed Care – PPO | Attending: Emergency Medicine | Admitting: Emergency Medicine

## 2020-10-12 ENCOUNTER — Ambulatory Visit (INDEPENDENT_AMBULATORY_CARE_PROVIDER_SITE_OTHER): Payer: Medicare PPO

## 2020-10-12 ENCOUNTER — Encounter (HOSPITAL_COMMUNITY): Payer: Self-pay | Admitting: Emergency Medicine

## 2020-10-12 ENCOUNTER — Other Ambulatory Visit: Payer: Self-pay

## 2020-10-12 DIAGNOSIS — E119 Type 2 diabetes mellitus without complications: Secondary | ICD-10-CM | POA: Diagnosis not present

## 2020-10-12 DIAGNOSIS — R0789 Other chest pain: Secondary | ICD-10-CM | POA: Diagnosis not present

## 2020-10-12 DIAGNOSIS — Z87891 Personal history of nicotine dependence: Secondary | ICD-10-CM | POA: Diagnosis not present

## 2020-10-12 DIAGNOSIS — I4819 Other persistent atrial fibrillation: Secondary | ICD-10-CM | POA: Insufficient documentation

## 2020-10-12 DIAGNOSIS — I4949 Other premature depolarization: Secondary | ICD-10-CM

## 2020-10-12 DIAGNOSIS — Z7984 Long term (current) use of oral hypoglycemic drugs: Secondary | ICD-10-CM | POA: Insufficient documentation

## 2020-10-12 DIAGNOSIS — R0602 Shortness of breath: Secondary | ICD-10-CM | POA: Diagnosis not present

## 2020-10-12 DIAGNOSIS — Z96652 Presence of left artificial knee joint: Secondary | ICD-10-CM | POA: Diagnosis not present

## 2020-10-12 DIAGNOSIS — I129 Hypertensive chronic kidney disease with stage 1 through stage 4 chronic kidney disease, or unspecified chronic kidney disease: Secondary | ICD-10-CM | POA: Diagnosis not present

## 2020-10-12 DIAGNOSIS — N183 Chronic kidney disease, stage 3 unspecified: Secondary | ICD-10-CM | POA: Insufficient documentation

## 2020-10-12 DIAGNOSIS — Z853 Personal history of malignant neoplasm of breast: Secondary | ICD-10-CM | POA: Diagnosis not present

## 2020-10-12 DIAGNOSIS — Z7901 Long term (current) use of anticoagulants: Secondary | ICD-10-CM | POA: Insufficient documentation

## 2020-10-12 DIAGNOSIS — Z79899 Other long term (current) drug therapy: Secondary | ICD-10-CM | POA: Diagnosis not present

## 2020-10-12 DIAGNOSIS — G8929 Other chronic pain: Secondary | ICD-10-CM

## 2020-10-12 DIAGNOSIS — R0600 Dyspnea, unspecified: Secondary | ICD-10-CM | POA: Insufficient documentation

## 2020-10-12 DIAGNOSIS — R079 Chest pain, unspecified: Secondary | ICD-10-CM

## 2020-10-12 LAB — CBC
HCT: 34.7 % — ABNORMAL LOW (ref 36.0–46.0)
Hemoglobin: 10.6 g/dL — ABNORMAL LOW (ref 12.0–15.0)
MCH: 19.3 pg — ABNORMAL LOW (ref 26.0–34.0)
MCHC: 30.5 g/dL (ref 30.0–36.0)
MCV: 63.1 fL — ABNORMAL LOW (ref 80.0–100.0)
Platelets: 166 10*3/uL (ref 150–400)
RBC: 5.5 MIL/uL — ABNORMAL HIGH (ref 3.87–5.11)
RDW: 17.9 % — ABNORMAL HIGH (ref 11.5–15.5)
WBC: 6.6 10*3/uL (ref 4.0–10.5)
nRBC: 0 % (ref 0.0–0.2)

## 2020-10-12 LAB — BASIC METABOLIC PANEL
Anion gap: 9 (ref 5–15)
BUN: 29 mg/dL — ABNORMAL HIGH (ref 8–23)
CO2: 22 mmol/L (ref 22–32)
Calcium: 8.8 mg/dL — ABNORMAL LOW (ref 8.9–10.3)
Chloride: 109 mmol/L (ref 98–111)
Creatinine, Ser: 1.39 mg/dL — ABNORMAL HIGH (ref 0.44–1.00)
GFR, Estimated: 40 mL/min — ABNORMAL LOW (ref >60)
Glucose, Bld: 157 mg/dL — ABNORMAL HIGH (ref 70–99)
Potassium: 4.5 mmol/L (ref 3.5–5.1)
Sodium: 140 mmol/L (ref 135–145)

## 2020-10-12 LAB — TROPONIN I (HIGH SENSITIVITY)
Troponin I (High Sensitivity): 11 ng/L (ref <18)
Troponin I (High Sensitivity): 12 ng/L (ref <18)

## 2020-10-12 LAB — PROTIME-INR
INR: 2.5 — ABNORMAL HIGH (ref 0.8–1.2)
Prothrombin Time: 26 seconds — ABNORMAL HIGH (ref 11.4–15.2)

## 2020-10-12 NOTE — Discharge Instructions (Addendum)
Your laboratory tests, blood tests, and imaging was normal or consistent with your previous findings.  There is nothing acutely worsening that would require admission to the hospital. You have an appointment upcoming with your cardiologist but you should call them to see if they would like to see you sooner. If your symptoms acutely worsen you should call your doctor or seek medical attention.

## 2020-10-12 NOTE — ED Notes (Signed)
Pt refusing to change into gown at this time. Pt would like to talk to MD first. Pt wanting to go home.

## 2020-10-12 NOTE — Telephone Encounter (Addendum)
Patient called in stating she continues with shortness of breath/DOE, orthopnea  and continued weight gain. Her weight this morning upon waking is 254lbs --she was 253lbs in the office full clothed etc on 2/15.   BP 133/64 HR 78 currently.  Patient is in NSR with PVCs- discussed with Adline Peals PA states to forward to Dr. Radford Pax for management/treatment. Please call pt to discuss.

## 2020-10-12 NOTE — ED Provider Notes (Incomplete)
[facility] EMERGENCY DEPARTMENT Provider Note   CSN: [id number] Arrival date & time: 10/12/20  4627     History Chief Complaint  Patient presents with  . Chest Pain    Jenna Mcdonald is a 75 y.o. female.  Heaviness and dypsnea on exertion.  increaed dilt to 120 x 2 in afib clinic.  Right now it feels better.  Doe, breathing.  Higher bp.          Past Medical History:  Diagnosis Date  . Anemia   . Aortic stenosis    mild by echo 09/2020 with mean AVG 6mmHg  . Breast CA ([place])    left  . Breast cancer ([place])   . Carotid stenosis    1-39% left  . Degenerative disc disease, lumbar    of the knees  . Diabetes mellitus   . 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[place])   . Fatigue    Severe secondary to to gemfibrozil  . Gastroesophageal reflux disease    denies  . GI bleeding    a. 06/2018: EGD showed multiple nonbleeding duodenal ulcers. 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  . Hammertoe    left second toe  . Hypercholesteremia   . Hypertension   . Insomnia   . Irritable bowel   . Low back pain   . Metabolic syndrome   . Morbid obesity ([place])   . OSA on CPAP \   bipap  . Osteoarthritis   . Osteopenia   . Persistent atrial fibrillation [facility])    s/p afib ablation - Dr. Rayann Heman 08/2018  . Personal history of radiation therapy   . Pneumonia   . PONV (postoperative nausea and vomiting)   . Sigmoid diverticulosis   . Thalassemia minor   . Vitamin D deficiency     Patient Active Problem List   Diagnosis Date Noted  . Encounter for therapeutic drug monitoring 08/17/2018  . CKD (chronic kidney disease) stage 3, GFR 30-59 ml/min (HCC) 07/21/2018  . Chronic anemia 07/21/2018  . Anemia   . Heme positive stool   . Atrial fibrillation with RVR ([place]) 07/04/2018  . Carotid stenosis   . Bruit of left carotid artery 09/22/2017  . Aortic stenosis   . Heart murmur 09/09/2016  . Morbid obesity  ([place]) 02/05/2016  . Dry eye 05/17/2015  . Type 2 diabetes mellitus without complication ([place]) 03/50/0938  . Breast cancer, left breast ([place]) 07/22/2014  . Obesity, Class III, BMI 40-49.9 (morbid obesity) ([place]) 07/06/2014  . 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 History of surgical procedure 05/17/2014  . OSA (obstructive sleep apnea) 11/17/2013  . S/P lumbar spinal fusion 09/22/2013  . Persistent atrial fibrillation ([place]) 09/11/2011    Class: Acute  . 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[place]) 09/11/2011    Class: Chronic  . Essential hypertension 09/11/2011  . Exogenous obesity 09/11/2011  . Gastroesophageal reflux disease 09/11/2011  . Thalassemia minor 09/11/2011    Class: Chronic  . Breast cancer ([place]) 09/11/2011    Class: Chronic  . Degenerative joint disease 09/11/2011    Class: Chronic  . Irritable bowel disease 09/11/2011    Class: Chronic  . Lumbar disc disease 09/11/2011    Past Surgical History:  Procedure Laterality Date  . ATRIAL FIBRILLATION ABLATION N/A 09/21/2018   Procedure: ATRIAL FIBRILLATION ABLATION;  Surgeon: Thompson Grayer, MD;  Location: [place] CV LAB;  Service: Cardiovascular;  Laterality: N/A;  . BIOPSY  07/06/2018   Procedure: BIOPSY;  Surgeon:  Clarene Essex, MD;  Location: [facility] ENDOSCOPY;  Service: Endoscopy;;  . BREAST LUMPECTOMY Left 05/28/2009  . BREAST SURGERY Left 10   lumpectomy  . CARDIOVERSION N/A 07/07/2018   Procedure: CARDIOVERSION;  Surgeon: [name], MD;  Location: [facility] ENDOSCOPY;  Service: Cardiovascular;  Laterality: N/A;  . CARDIOVERSION N/A 07/14/2018   Procedure: CARDIOVERSION;  Surgeon: Larey Dresser, MD;  Location: [facility] ENDOSCOPY;  Service: Cardiovascular;  Laterality: N/A;  . CESAREAN SECTION    . COLONOSCOPY WITH PROPOFOL N/A 07/06/2018   Procedure: COLONOSCOPY WITH PROPOFOL;  Surgeon: Clarene Essex, MD;  Location: [place];  Service: Endoscopy;  Laterality: N/A;  . CYSTECTOMY  70   pilonidal   . DILATION AND CURETTAGE OF UTERUS    .  ESOPHAGOGASTRODUODENOSCOPY (EGD) WITH PROPOFOL N/A 07/06/2018   Procedure: ESOPHAGOGASTRODUODENOSCOPY (EGD) WITH PROPOFOL;  Surgeon: Clarene Essex, MD;  Location: [place];  Service: Endoscopy;  Laterality: N/A;  . EYE SURGERY Bilateral 12/14   cataracts  . HOT HEMOSTASIS N/A 07/06/2018   Procedure: HOT HEMOSTASIS (ARGON PLASMA COAGULATION/BICAP);  Surgeon: Clarene Essex, MD;  Location: [place];  Service: Endoscopy;  Laterality: N/A;  . MAXIMUM ACCESS (MAS)POSTERIOR LUMBAR INTERBODY FUSION (PLIF) 2 LEVEL N/A 09/22/2013   Procedure: FOR MAXIMUM ACCESS  POSTERIOR LUMBAR INTERBODY FUSION LUMBAR THREE-FOUR FOUR-FIVE;  Surgeon: Eustace Moore, MD;  Location: [place] NEURO ORS;  Service: Neurosurgery;  Laterality: N/A;  . OOPHORECTOMY     wedge section not rem  . 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 REPLACEMENT TOTAL KNEE    . TEE WITHOUT CARDIOVERSION N/A 07/07/2018   Procedure: TRANSESOPHAGEAL ECHOCARDIOGRAM (TEE);  Surgeon: [name], MD;  Location: [place];  Service: Cardiovascular;  Laterality: N/A;  . TEE WITHOUT CARDIOVERSION N/A 09/21/2018   Procedure: TRANSESOPHAGEAL ECHOCARDIOGRAM (TEE);  Surgeon: Acie Fredrickson Wonda Cheng, MD;  Location: [facility] ENDOSCOPY;  Service: Cardiovascular;  Laterality: N/A;  . TONSILLECTOMY    . TUBAL LIGATION       OB History   No obstetric history on file.     Family History  Problem Relation Age of Onset  . Anesthesia problems Mother   . Diabetes Mother   . Diabetes Father     Social History   Tobacco Use  . Smoking status: Former Smoker    Packs/day: 0.50    Years: 10.00    Pack years: 5.00    Types: Cigarettes    Quit date: 11/12/2003    Years since quitting: 16.9  . Smokeless tobacco: Never Used  Vaping Use  . Vaping Use: Never used  Substance Use Topics  . Alcohol use: Yes    Comment: occasionally  . Drug use: No    Home Medications Prior to Admission medications   Medication Sig Start Date End Date Taking? Authorizing Provider   Calcium Carbonate-Vitamin D (CALCIUM 600 + D PO) Take 1 tablet by mouth daily.     [provider]  celecoxib (CELEBREX) 200 MG capsule Take 200 mg by mouth daily.    [provider]  Cholecalciferol (VITAMIN D3) 25 MCG (1000 UT) CAPS Take by mouth.    [provider]  diclofenac Sodium (VOLTAREN) 1 % GEL Apply 4 g topically daily.    [provider]  diltiazem (CARDIZEM CD) 120 MG 24 hr capsule Take 1 capsule (120 mg total) by mouth in the morning and at bedtime. 10/10/20   Sherran Needs, NP  diphenhydramine-acetaminophen (TYLENOL PM) 25-500 MG TABS tablet Take 2 tablets by mouth as needed (sleep).  [provider]  glimepiride (AMARYL) 2 MG tablet Take 2 mg by mouth daily with breakfast.     [provider]  losartan (COZAAR) 100 MG tablet Take 1 tablet (100 mg total) by mouth daily. 02/16/20 05/16/20  Josetta Huddle, MD  metFORMIN (GLUCOPHAGE-XR) 500 MG 24 hr tablet Take 500 mg by mouth 2 (two) times daily.     [provider]  [facility] ULTRA test strip 1 each by Other route daily. 04/22/19   [provider]  [place] 30 MG/2ML SOSY 1 syringe intraarticularly once a week 07/11/20   [provider]  rosuvastatin (CRESTOR) 5 MG tablet Take 5 mg by mouth once a week. Pt states she is taking weekly per Dr. Inda Merlin.    [provider]  warfarin (COUMADIN) 4 MG tablet TAKE ON MONDAY, [place] AS DIRECTED BY THE COUMADIN CLINIC 09/28/20   Sueanne Margarita, MD  warfarin (COUMADIN) 5 MG tablet TAKE ON SUNDAY, TUESDAY, THURSDAY, AND SATURDAY OR TAKE AS DIRECTED BY THE COUMADIN CLINIC. 09/28/20   Sueanne Margarita, MD    Allergies    Metoprolol, Gemfibrozil, Other, Tamoxifen, Tetracyclines & related, Toprol xl [metoprolol tartrate], Verapamil, Vicodin [hydrocodone-acetaminophen], and Aromasin [exemestane]  Review of Systems   Review of Systems  Physical Exam Updated Vital Signs BP (!) 159/84   Pulse 72    Temp 98.8 F (37.1 C) (Oral)   Resp 16   SpO2 99%   Physical Exam  ED Results / Procedures / Treatments   Labs (all labs ordered are listed, but only abnormal results are displayed) Labs Reviewed  BASIC METABOLIC PANEL - Abnormal; Notable for the following components:      Result Value   Glucose, Bld 157 (*)    BUN 29 (*)    Creatinine, Ser 1.39 (*)    Calcium 8.8 (*)    GFR, Estimated 40 (*)    All other components within normal limits  CBC - Abnormal; Notable for the following components:   RBC 5.50 (*)    Hemoglobin 10.6 (*)    HCT 34.7 (*)    MCV 63.1 (*)    MCH 19.3 (*)    RDW 17.9 (*)    All other components within normal limits  TROPONIN I (HIGH SENSITIVITY)  TROPONIN I (HIGH SENSITIVITY)    EKG EKG Interpretation  Date/Time:  Friday October 12 2020 17:19:01 EST Ventricular Rate:  83 PR Interval:  146 QRS Duration: 84 QT Interval:  386 QTC Calculation: 453 R Axis:   75 Text Interpretation: Sinus rhythm with frequent Premature ventricular complexes Nonspecific T wave abnormality Abnormal ECG Confirmed by Quintella Reichert [phone]) on 10/12/2020 6:25:36 PM   Radiology DG Chest 2 View  Result Date: 10/12/2020 CLINICAL DATA:  Short of breath, chest heaviness for 1 month EXAM: CHEST - 2 VIEW COMPARISON:  07/21/2018 FINDINGS: Frontal and lateral views of the chest demonstrate a stable cardiac silhouette. Mild increased central vascular congestion with diffuse interstitial prominence, favor mild volume overload. No acute airspace disease, effusion, or pneumothorax. No acute bony abnormalities. IMPRESSION: 1. Findings consistent with mild interstitial edema. Electronically Signed   By: Randa Ngo M.D.   On: 10/12/2020 16:33    Procedures Procedures {Remember to document critical care time when appropriate:1}  Medications Ordered in ED Medications - No data to display  ED Course  I have reviewed the triage vital signs and the nursing notes.  Pertinent labs  & imaging results that were available during my care  of the patient were reviewed by me and considered in my medical decision making (see chart for details).    MDM Rules/Calculators/A&P                          *** Final Clinical Impression(s) / ED Diagnoses Final diagnoses:  None    Rx / DC Orders ED Discharge Orders    None

## 2020-10-12 NOTE — Telephone Encounter (Signed)
Patient called in stating she continues with shortness of breath and continued weight gain. She reports being SOB on exertion and when at rest, patients chest feels heavy. She denies chest pain, describes her chest discomfort as pressure that has been ongoing for the past month. Denies any radiation of discomfort. Patient states her throat also feels tight, making it hard to clear her throat, but has been eating and drinking regularly.   Patient saw Afib clinic on Wednesday where her Diltiazem was increased.  Her home monitor states her rhythm is still irregular. BP 133/64 HR 79  Denies any weight gain but does not have any recent weights, denies N/V, diaphoresis, and swelling.   Denies any additional sodium or caffeine in her diet. Patient states she has been trying to keep a low sodium diet with minimal caffeine.

## 2020-10-12 NOTE — ED Triage Notes (Signed)
Pt arrives to ED with chief complaint of chest heaviness for over 1 month she was seen 2 weeks ago she seen at cardiologist and had a stress test and an eco within the last 2 week. After her echo she was sent to the afib clinic which increased her diltiazem. She arrives to ER due to ongoing pressure which is worse when she walks or any exertion.

## 2020-10-12 NOTE — ED Notes (Addendum)
Pt's pulse registered as 39 on Dynomat. Manually palpated at 39 as well. Informed Jessica- RN in triage.

## 2020-10-12 NOTE — Telephone Encounter (Signed)
Spoke with the patient and advised her on Dr. Theodosia Blender recommendations for a heart monitor and ER evaluation. Patient states that she is feeling terrible but is very upset. Agreeable to going to the ER for evaluation.

## 2020-10-12 NOTE — ED Notes (Signed)
MD at the bedside  

## 2020-10-12 NOTE — ED Provider Notes (Signed)
[facility] EMERGENCY DEPARTMENT Provider Note   CSN: [id number] Arrival date & time: 10/12/20  6295     History Chief Complaint  Patient presents with  . Chest Pain    Jenna Mcdonald is a 75 y.o. female.  Patient presents today with complaints of chest "heaviness" and dyspnea on exertion that have been going on since January she states.  Patient had an echocardiogram 3 days ago and an appointment with the A. fib clinic 2 days ago.  Symptoms are not worse since that time.  Patient has a history of A. fib and is status post ablation.  On diltiazem as well as warfarin.  At the [facility] clinic her diltiazem was doubled.  Patient's heaviness increases with exertion.  Patient cannot go up a flight of stairs without taking a break.  Endorses waking up at night with difficulty breathing.  Uses a BiPAP machine at night.  Sleeps on her side.  Patient denies headache, blurred vision, and/V/D/abdominal pain, dizziness, lower extremity pain or swelling.  Patient current med list is up-to-date.  Is compliant with her medications.        Past Medical History:  Diagnosis Date  . Anemia   . Aortic stenosis    mild by echo 09/2020 with mean AVG 32mmHg  . Breast CA ([place])    left  . Breast cancer ([place])   . Carotid stenosis    1-39% left  . Degenerative disc disease, lumbar    of the knees  . Diabetes mellitus   . 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[place])   . Fatigue    Severe secondary to to gemfibrozil  . Gastroesophageal reflux disease    denies  . GI bleeding    a. 06/2018: EGD showed multiple nonbleeding duodenal ulcers. 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  . Hammertoe    left second toe  . Hypercholesteremia   . Hypertension   . Insomnia   . Irritable bowel   . Low back pain   . Metabolic syndrome   . Morbid obesity ([place])   . OSA on CPAP \   bipap  . Osteoarthritis   . Osteopenia   . Persistent  atrial fibrillation [facility])    s/p afib ablation - Dr. Rayann Heman 08/2018  . Personal history of radiation therapy   . Pneumonia   . PONV (postoperative nausea and vomiting)   . Sigmoid diverticulosis   . Thalassemia minor   . Vitamin D deficiency     Patient Active Problem List   Diagnosis Date Noted  . Encounter for therapeutic drug monitoring 08/17/2018  . CKD (chronic kidney disease) stage 3, GFR 30-59 ml/min (HCC) 07/21/2018  . Chronic anemia 07/21/2018  . Anemia   . Heme positive stool   . Atrial fibrillation with RVR ([place]) 07/04/2018  . Carotid stenosis   . Bruit of left carotid artery 09/22/2017  . Aortic stenosis   . Heart murmur 09/09/2016  . Morbid obesity ([place]) 02/05/2016  . Dry eye 05/17/2015  . Type 2 diabetes mellitus without complication ([place]) 28/41/3244  . Breast cancer, left breast ([place]) 07/22/2014  . Obesity, Class III, BMI 40-49.9 (morbid obesity) ([place]) 07/06/2014  . 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 History of surgical procedure 05/17/2014  . OSA (obstructive sleep apnea) 11/17/2013  . S/P lumbar spinal fusion 09/22/2013  . Persistent atrial fibrillation ([place]) 09/11/2011    Class: Acute  . 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[place]) 09/11/2011    Class: Chronic  .  Essential hypertension 09/11/2011  . Exogenous obesity 09/11/2011  . Gastroesophageal reflux disease 09/11/2011  . Thalassemia minor 09/11/2011    Class: Chronic  . Breast cancer ([place]) 09/11/2011    Class: Chronic  . Degenerative joint disease 09/11/2011    Class: Chronic  . Irritable bowel disease 09/11/2011    Class: Chronic  . Lumbar disc disease 09/11/2011    Past Surgical History:  Procedure Laterality Date  . ATRIAL FIBRILLATION ABLATION N/A 09/21/2018   Procedure: ATRIAL FIBRILLATION ABLATION;  Surgeon: Thompson Grayer, MD;  Location: [facility] CV LAB;  Service: Cardiovascular;  Laterality: N/A;  . BIOPSY  07/06/2018   Procedure: BIOPSY;  Surgeon: Clarene Essex, MD;  Location: [facility];  Service:  Endoscopy;;  . BREAST LUMPECTOMY Left 05/28/2009  . BREAST SURGERY Left 10   lumpectomy  . CARDIOVERSION N/A 07/07/2018   Procedure: CARDIOVERSION;  Surgeon: [name], MD;  Location: [facility] ENDOSCOPY;  Service: Cardiovascular;  Laterality: N/A;  . CARDIOVERSION N/A 07/14/2018   Procedure: CARDIOVERSION;  Surgeon: Larey Dresser, MD;  Location: [facility] ENDOSCOPY;  Service: Cardiovascular;  Laterality: N/A;  . CESAREAN SECTION    . COLONOSCOPY WITH PROPOFOL N/A 07/06/2018   Procedure: COLONOSCOPY WITH PROPOFOL;  Surgeon: Clarene Essex, MD;  Location: [facility];  Service: Endoscopy;  Laterality: N/A;  . CYSTECTOMY  70   pilonidal   . DILATION AND CURETTAGE OF UTERUS    . ESOPHAGOGASTRODUODENOSCOPY (EGD) WITH PROPOFOL N/A 07/06/2018   Procedure: ESOPHAGOGASTRODUODENOSCOPY (EGD) WITH PROPOFOL;  Surgeon: Clarene Essex, MD;  Location: [facility];  Service: Endoscopy;  Laterality: N/A;  . EYE SURGERY Bilateral 12/14   cataracts  . HOT HEMOSTASIS N/A 07/06/2018   Procedure: HOT HEMOSTASIS (ARGON PLASMA COAGULATION/BICAP);  Surgeon: Clarene Essex, MD;  Location: [facility];  Service: Endoscopy;  Laterality: N/A;  . MAXIMUM ACCESS (MAS)POSTERIOR LUMBAR INTERBODY FUSION (PLIF) 2 LEVEL N/A 09/22/2013   Procedure: FOR MAXIMUM ACCESS  POSTERIOR LUMBAR INTERBODY FUSION LUMBAR THREE-FOUR FOUR-FIVE;  Surgeon: Eustace Moore, MD;  Location: [facility] NEURO ORS;  Service: Neurosurgery;  Laterality: N/A;  . OOPHORECTOMY     wedge section not rem  . 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 REPLACEMENT TOTAL KNEE    . TEE WITHOUT CARDIOVERSION N/A 07/07/2018   Procedure: TRANSESOPHAGEAL ECHOCARDIOGRAM (TEE);  Surgeon: [name], MD;  Location: [facility];  Service: Cardiovascular;  Laterality: N/A;  . TEE WITHOUT CARDIOVERSION N/A 09/21/2018   Procedure: TRANSESOPHAGEAL ECHOCARDIOGRAM (TEE);  Surgeon: Acie Fredrickson Wonda Cheng, MD;  Location: [facility] ENDOSCOPY;  Service: Cardiovascular;  Laterality: N/A;  . TONSILLECTOMY    .  TUBAL LIGATION       OB History   No obstetric history on file.     Family History  Problem Relation Age of Onset  . Anesthesia problems Mother   . Diabetes Mother   . Diabetes Father     Social History   Tobacco Use  . Smoking status: Former Smoker    Packs/day: 0.50    Years: 10.00    Pack years: 5.00    Types: Cigarettes    Quit date: 11/12/2003    Years since quitting: 16.9  . Smokeless tobacco: Never Used  Vaping Use  . Vaping Use: Never used  Substance Use Topics  . Alcohol use: Yes    Comment: occasionally  . Drug use: No    Home Medications Prior to Admission medications   Medication Sig Start Date End Date Taking? Authorizing Provider  Calcium Carbonate-Vitamin D (CALCIUM 600 + D PO)  Take 1 tablet by mouth daily.     [provider]  celecoxib (CELEBREX) 200 MG capsule Take 200 mg by mouth daily.    [provider]  Cholecalciferol (VITAMIN D3) 25 MCG (1000 UT) CAPS Take by mouth.    [provider]  diclofenac Sodium (VOLTAREN) 1 % GEL Apply 4 g topically daily.    [provider]  diltiazem (CARDIZEM CD) 120 MG 24 hr capsule Take 1 capsule (120 mg total) by mouth in the morning and at bedtime. 10/10/20   Sherran Needs, NP  diphenhydramine-acetaminophen (TYLENOL PM) 25-500 MG TABS tablet Take 2 tablets by mouth as needed (sleep).     [provider]  glimepiride (AMARYL) 2 MG tablet Take 2 mg by mouth daily with breakfast.     [provider]  losartan (COZAAR) 100 MG tablet Take 1 tablet (100 mg total) by mouth daily. 02/16/20 05/16/20  Josetta Huddle, MD  metFORMIN (GLUCOPHAGE-XR) 500 MG 24 hr tablet Take 500 mg by mouth 2 (two) times daily.     [provider]  [facility] ULTRA test strip 1 each by Other route daily. 04/22/19   [provider]  [place] 30 MG/2ML SOSY 1 syringe intraarticularly once a week 07/11/20   [provider]  rosuvastatin (CRESTOR) 5 MG tablet Take 5 mg by  mouth once a week. Pt states she is taking weekly per Dr. Inda Merlin.    [provider]  warfarin (COUMADIN) 4 MG tablet TAKE ON MONDAY, WEDNESDAY AND FRIDAY AS DIRECTED BY THE COUMADIN CLINIC 09/28/20   Sueanne Margarita, MD  warfarin (COUMADIN) 5 MG tablet TAKE ON SUNDAY, TUESDAY, THURSDAY, AND SATURDAY OR TAKE AS DIRECTED BY THE COUMADIN CLINIC. 09/28/20   Sueanne Margarita, MD    Allergies    Metoprolol, Gemfibrozil, Other, Tamoxifen, Tetracyclines & related, Toprol xl [metoprolol tartrate], Verapamil, Vicodin [hydrocodone-acetaminophen], and Aromasin [exemestane]  Review of Systems   Review of Systems  All other systems reviewed and are negative.   Physical Exam Updated Vital Signs BP (!) 160/72 (BP Location: Right Arm)   Pulse 72   Temp 97.8 F (36.6 C) (Oral)   Resp (!) 27   SpO2 99%   Physical Exam Constitutional:      General: She is not in acute distress.    Appearance: She is not ill-appearing or diaphoretic.  Eyes:     Extraocular Movements: Extraocular movements intact.     Pupils: Pupils are equal, round, and reactive to light.  Cardiovascular:     Rate and Rhythm: Normal rate.     Heart sounds: Heart sounds are distant.   No systolic murmur is present.   Pulmonary:     Effort: Pulmonary effort is normal. No tachypnea or respiratory distress.     Breath sounds: No wheezing or rhonchi.  Chest:     Chest wall: No tenderness.  Abdominal:     General: Bowel sounds are normal.     Palpations: Abdomen is soft.     Tenderness: There is no abdominal tenderness.  Musculoskeletal:     Cervical back: Normal range of motion.  Skin:    General: Skin is warm and dry.  Neurological:     General: No focal deficit present.     Mental Status: She is alert and oriented to person, place, and time.     Cranial Nerves: No cranial nerve deficit.  Psychiatric:        Mood and Affect: Mood normal.  Behavior: Behavior normal.     ED Results / Procedures / Treatments    Labs (all labs ordered are listed, but only abnormal results are displayed) Labs Reviewed  BASIC METABOLIC PANEL - Abnormal; Notable for the following components:      Result Value   Glucose, Bld 157 (*)    BUN 29 (*)    Creatinine, Ser 1.39 (*)    Calcium 8.8 (*)    GFR, Estimated 40 (*)    All other components within normal limits  CBC - Abnormal; Notable for the following components:   RBC 5.50 (*)    Hemoglobin 10.6 (*)    HCT 34.7 (*)    MCV 63.1 (*)    MCH 19.3 (*)    RDW 17.9 (*)    All other components within normal limits  PROTIME-INR - Abnormal; Notable for the following components:   Prothrombin Time 26.0 (*)    INR 2.5 (*)    All other components within normal limits  TROPONIN I (HIGH SENSITIVITY)  TROPONIN I (HIGH SENSITIVITY)    EKG EKG Interpretation  Date/Time:  Friday October 12 2020 17:19:01 EST Ventricular Rate:  83 PR Interval:  146 QRS Duration: 84 QT Interval:  386 QTC Calculation: 453 R Axis:   75 Text Interpretation: Sinus rhythm with frequent Premature ventricular complexes Nonspecific T wave abnormality Abnormal ECG Confirmed by Quintella Reichert [phone]) on 10/12/2020 6:25:36 PM   Radiology DG Chest 2 View  Result Date: 10/12/2020 CLINICAL DATA:  Short of breath, chest heaviness for 1 month EXAM: CHEST - 2 VIEW COMPARISON:  07/21/2018 FINDINGS: Frontal and lateral views of the chest demonstrate a stable cardiac silhouette. Mild increased central vascular congestion with diffuse interstitial prominence, favor mild volume overload. No acute airspace disease, effusion, or pneumothorax. No acute bony abnormalities. IMPRESSION: 1. Findings consistent with mild interstitial edema. Electronically Signed   By: Randa Ngo M.D.   On: 10/12/2020 16:33    Procedures Procedures   Medications Ordered in ED Medications - No data to display  ED Course  I have reviewed the triage vital signs and the nursing notes.  Pertinent labs & imaging results  that were available during my care of the patient were reviewed by me and considered in my medical decision making (see chart for details).    MDM Rules/Calculators/A&P                          Patient presents today with complaints of chest heaviness and dyspnea on exertion which have not worsened since her visit to the cardiologist two days ago.  Patient's echo 3 days ago showed EF 70-75%, mild LVH and G2DD. Also showed some arrhythmias concerning for possible atrial fibrillation (has hx of afib s/p ablation currently on dilt and warfarin.).  Patient was seen in afib clinic the next day where her arrhythmia was felt to be more due to frequent PVCs. dilt was increased to two tablets a day from one.  EKG today suggestive of bigeminy. Troponins, CXR, and lab tests all nonconcerning for acute cardiac pathology. INR in therapeutic range.  Low suspicion for PE.  Patient has follow up next week with her cardiologist.  Advised pt to call her cardiologist at [facility] availability and see if they would like to see her sooner than her feb 23rd appt at [facility].  Advised pt to seek medical attention if symptoms acutely worsen.   Final Clinical Impression(s) / ED Diagnoses Final  diagnoses:  Chronic chest pain  Dyspnea, unspecified type    Rx / DC Orders ED Discharge Orders    None       Benay Pike, MD 10/12/20 2142    Quintella Reichert, MD 10/13/20 1247

## 2020-10-12 NOTE — Telephone Encounter (Signed)
Pt c/o of Chest Pain: STAT if CP now or developed within 24 hours  1. Are you having CP right now? Yes   2. Are you experiencing any other symptoms (ex. SOB, nausea, vomiting, sweating)? SOB, feels like "flem" in throat that won't swallow   3. How long have you been experiencing CP? Has been occurring since 10/10/20  4. Is your CP continuous or coming and going? Continuous, but worse upon exertion   5. Have you taken Nitroglycerin? No ?

## 2020-10-12 NOTE — Telephone Encounter (Signed)
She had frequent PVCs on recent EKG.  Please get a 3 day ziopatch for PVC load.  Recent echo showed normal LVF and G2DD.  Her recent stress test was normal.  Not sure what chest tightness is from .  Needs to go to ER to get evaluated with hs Trop and ddimer to rule out PE

## 2020-10-15 ENCOUNTER — Encounter (HOSPITAL_COMMUNITY): Payer: Self-pay | Admitting: Physician Assistant

## 2020-10-15 ENCOUNTER — Ambulatory Visit (HOSPITAL_COMMUNITY)
Admission: RE | Admit: 2020-10-15 | Discharge: 2020-10-15 | Disposition: A | Discharge status: Home / Self Care | Payer: Medicare PPO | Source: Ambulatory Visit | Attending: Physician Assistant | Admitting: Physician Assistant

## 2020-10-15 ENCOUNTER — Telehealth: Payer: Self-pay | Admitting: Cardiology

## 2020-10-15 ENCOUNTER — Other Ambulatory Visit: Payer: Self-pay

## 2020-10-15 VITALS — BP 134/60 | HR 81 | Ht 65.0 in | Wt 255.6 lb

## 2020-10-15 DIAGNOSIS — I4819 Other persistent atrial fibrillation: Secondary | ICD-10-CM | POA: Diagnosis not present

## 2020-10-15 DIAGNOSIS — Z79899 Other long term (current) drug therapy: Secondary | ICD-10-CM | POA: Insufficient documentation

## 2020-10-15 DIAGNOSIS — R0609 Other forms of dyspnea: Secondary | ICD-10-CM | POA: Insufficient documentation

## 2020-10-15 DIAGNOSIS — Z7984 Long term (current) use of oral hypoglycemic drugs: Secondary | ICD-10-CM | POA: Insufficient documentation

## 2020-10-15 DIAGNOSIS — I110 Hypertensive heart disease with heart failure: Secondary | ICD-10-CM | POA: Insufficient documentation

## 2020-10-15 DIAGNOSIS — I5031 Acute diastolic (congestive) heart failure: Secondary | ICD-10-CM

## 2020-10-15 DIAGNOSIS — I493 Ventricular premature depolarization: Secondary | ICD-10-CM | POA: Insufficient documentation

## 2020-10-15 DIAGNOSIS — Z87891 Personal history of nicotine dependence: Secondary | ICD-10-CM | POA: Diagnosis not present

## 2020-10-15 DIAGNOSIS — D6869 Other thrombophilia: Secondary | ICD-10-CM | POA: Insufficient documentation

## 2020-10-15 DIAGNOSIS — I5033 Acute on chronic diastolic (congestive) heart failure: Secondary | ICD-10-CM | POA: Insufficient documentation

## 2020-10-15 DIAGNOSIS — Z7901 Long term (current) use of anticoagulants: Secondary | ICD-10-CM | POA: Diagnosis not present

## 2020-10-15 DIAGNOSIS — Z96652 Presence of left artificial knee joint: Secondary | ICD-10-CM | POA: Diagnosis not present

## 2020-10-15 MED ORDER — FUROSEMIDE 40 MG PO TABS: ORAL_TABLET | ORAL | Status: DC

## 2020-10-15 MED ORDER — POTASSIUM CHLORIDE ER 10 MEQ PO TBCR
10.0000 meq | EXTENDED_RELEASE_TABLET | Freq: Every day | ORAL | 0 refills | Status: DC
Start: 2020-10-15 — End: 2020-10-23

## 2020-10-15 NOTE — Telephone Encounter (Signed)
Patient c/o Palpitations:  High priority if patient c/o lightheadedness, shortness of breath, or chest pain  1) How long have you had palpitations/irregular HR/ Afib? Are you having the symptoms now? Arrthymia  2) Are you currently experiencing lightheadedness, SOB or CP? Chest pressure, short of breath   3) Do you have a history of afib (atrial fibrillation) or irregular heart rhythm? yes  4) Have you checked your BP or HR? (document readings if available):158/72 pressure is high for her  5) Are you experiencing any other symptoms?  Can feel her heart beating- pt wants to be seen today- just left the hospital on Friday

## 2020-10-15 NOTE — Telephone Encounter (Signed)
Returned call to Pt.  Offered her an appt at 2:00 pm today with Afib clinic.  Pt states "is that the best you can do?"  Advised that was soonest available appt.  Encouraged Pt to be open to suggestions from afib clinic.   Pt had previously refused furosemide during recent ER visit.  Per Pt "I didn't need lasix my feet aren't swollen".  Advised lasix also used to treat SOB.  Pt will follow up with afib clinic.

## 2020-10-15 NOTE — Patient Instructions (Signed)
Lasix (furosemide) take 40mg  once a day for 3 days then you will reduce it to 20mg  once a day for 4 days then stop.  Follow up with Dr. Radford Pax in 1 week.

## 2020-10-15 NOTE — Progress Notes (Addendum)
Primary Care Physician: Josetta Huddle, MD Referring Physician: [facility] f/u EP:Dr. Allred Cardiologist: Dr.Turner   Jenna Mcdonald is a 75 y.o. female with a h/o persistent afib that was  hospitalized 07/20/18  for failure of flecainide to maintain  SR. She was started on amiodarone. Dr. Rayann Heman consulted and thought pt may be an ablation candidate.  She is on warfarin.   I last saw pt  10/19/18. She was s/p ablation x one month. She reports that she had done exceptionally well. She had not noted any afib. She was having however, some GI issues that she thought was from amiodarone and stopped drug.   I am now seeing pt, 10/10/20, at the request of Dr. Radford Pax. She was having an echo yesterday and the echo tech noted irregular heart beat and reported she was in afib, but I feel it  was the premature beats , not afib. Ekg  today shows SR with frequent PVC's. She recently had a stress test and echo for reporting to Dr. Radford Pax that she is short of breath walking to her mailbox with pressure in her chest for the last last several weeks. PC's were noted at time of stress test as well.  She reports that she has gained around 20 lbs over the last year. Her BP is very elevated today. She checks very rarely;y at home. Her stress test did not show any ischemia but EF was reported moderately decreased. Dr. Radford Pax thought it was 2/2 to the PC's and wanted to see EF from echo, results  are pending from yesterday. She has OSA and is using CPAP.   Follow up in the AF clinic 10/15/20. Patient presented to ED on 10/12/20 with chest heaviness and dyspnea on exertion. ACS was ruled out but she did have evidence of vascular congestion on CXR. She refused Lasix and was discharged home. ECG showed SR with bigeminal PVCs. She continues to have dyspnea with exertion and orthopnea.   Today, she denies symptoms of chest pain, PND, dizziness, presyncope, syncope, or neurologic sequela. The patient is tolerating medications  without difficulties and is otherwise without complaint today.   Past Medical History:  Diagnosis Date  . Anemia   . Aortic stenosis    mild by echo 09/2020 with mean AVG 88mmHg  . Breast CA ([place])    left  . Breast cancer ([place])   . Carotid stenosis    1-39% left  . Degenerative disc disease, lumbar    of the knees  . Diabetes mellitus   . 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[place])   . Fatigue    Severe secondary to to gemfibrozil  . Gastroesophageal reflux disease    denies  . GI bleeding    a. 06/2018: EGD showed multiple nonbleeding duodenal ulcers. 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  . Hammertoe    left second toe  . Hypercholesteremia   . Hypertension   . Insomnia   . Irritable bowel   . Low back pain   . Metabolic syndrome   . Morbid obesity ([place])   . OSA on CPAP \   bipap  . Osteoarthritis   . Osteopenia   . Persistent atrial fibrillation [facility])    s/p afib ablation - Dr. Rayann Heman 08/2018  . Personal history of radiation therapy   . Pneumonia   . PONV (postoperative nausea and vomiting)   . Sigmoid diverticulosis   . Thalassemia minor   . Vitamin D deficiency  Past Surgical History:  Procedure Laterality Date  . ATRIAL FIBRILLATION ABLATION N/A 09/21/2018   Procedure: ATRIAL FIBRILLATION ABLATION;  Surgeon: Thompson Grayer, MD;  Location: [facility];  Service: Cardiovascular;  Laterality: N/A;  . BIOPSY  07/06/2018   Procedure: BIOPSY;  Surgeon: Clarene Essex, MD;  Location: [place];  Service: Endoscopy;;  . BREAST LUMPECTOMY Left 05/28/2009  . BREAST SURGERY Left 10   lumpectomy  . CARDIOVERSION N/A 07/07/2018   Procedure: CARDIOVERSION;  Surgeon: [name], MD;  Location: [facility] ENDOSCOPY;  Service: Cardiovascular;  Laterality: N/A;  . CARDIOVERSION N/A 07/14/2018   Procedure: CARDIOVERSION;  Surgeon: Larey Dresser, MD;  Location: [facility] ENDOSCOPY;  Service: Cardiovascular;  Laterality: N/A;   . CESAREAN SECTION    . COLONOSCOPY WITH PROPOFOL N/A 07/06/2018   Procedure: COLONOSCOPY WITH PROPOFOL;  Surgeon: Clarene Essex, MD;  Location: [place];  Service: Endoscopy;  Laterality: N/A;  . CYSTECTOMY  70   pilonidal   . DILATION AND CURETTAGE OF UTERUS    . ESOPHAGOGASTRODUODENOSCOPY (EGD) WITH PROPOFOL N/A 07/06/2018   Procedure: ESOPHAGOGASTRODUODENOSCOPY (EGD) WITH PROPOFOL;  Surgeon: Clarene Essex, MD;  Location: [place];  Service: Endoscopy;  Laterality: N/A;  . EYE SURGERY Bilateral 12/14   cataracts  . HOT HEMOSTASIS N/A 07/06/2018   Procedure: HOT HEMOSTASIS (ARGON PLASMA COAGULATION/BICAP);  Surgeon: Clarene Essex, MD;  Location: [place];  Service: Endoscopy;  Laterality: N/A;  . MAXIMUM ACCESS (MAS)POSTERIOR LUMBAR INTERBODY FUSION (PLIF) 2 LEVEL N/A 09/22/2013   Procedure: FOR MAXIMUM ACCESS  POSTERIOR LUMBAR INTERBODY FUSION LUMBAR THREE-FOUR FOUR-FIVE;  Surgeon: Eustace Moore, MD;  Location: [place] [facility];  Service: Neurosurgery;  Laterality: N/A;  . OOPHORECTOMY     wedge section not rem  . 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 REPLACEMENT TOTAL KNEE    . TEE WITHOUT CARDIOVERSION N/A 07/07/2018   Procedure: TRANSESOPHAGEAL ECHOCARDIOGRAM (TEE);  Surgeon: [name], MD;  Location: [place];  Service: Cardiovascular;  Laterality: N/A;  . TEE WITHOUT CARDIOVERSION N/A 09/21/2018   Procedure: TRANSESOPHAGEAL ECHOCARDIOGRAM (TEE);  Surgeon: Acie Fredrickson Wonda Cheng, MD;  Location: [facility] ENDOSCOPY;  Service: Cardiovascular;  Laterality: N/A;  . TONSILLECTOMY    . TUBAL LIGATION      Current Outpatient Medications  Medication Sig Dispense Refill  . Calcium Carbonate-Vitamin D (CALCIUM 600 + D PO) Take 1 tablet by mouth daily.     . celecoxib (CELEBREX) 200 MG capsule Take 200 mg by mouth daily.    . Cholecalciferol (VITAMIN D3) 25 MCG (1000 UT) CAPS Take by mouth.    . diclofenac Sodium (VOLTAREN) 1 % GEL Apply 4 g topically daily.    Marland Kitchen diltiazem (CARDIZEM CD) 120  MG 24 hr capsule Take 1 capsule (120 mg total) by mouth in the morning and at bedtime. 90 capsule 2  . diphenhydramine-acetaminophen (TYLENOL PM) 25-500 MG TABS tablet Take 2 tablets by mouth as needed (sleep).     Marland Kitchen glimepiride (AMARYL) 4 MG tablet 0.5 TABLET ORALLY EVERY EVENING 90 DAYS    . metFORMIN (GLUCOPHAGE-XR) 500 MG 24 hr tablet Take 500 mg by mouth 2 (two) times daily.   3  . ONETOUCH ULTRA test strip 1 each by Other route daily.    . ORTHOVISC 30 MG/2ML SOSY 1 syringe intraarticularly once a week    . rosuvastatin (CRESTOR) 5 MG tablet Take 5 mg by mouth once a week. Pt states she is taking weekly per Dr. Inda Merlin.    . valsartan (DIOVAN) 160 MG  tablet 1 tablet    . warfarin (COUMADIN) 4 MG tablet TAKE ON MONDAY, WEDNESDAY AND FRIDAY AS DIRECTED BY THE COUMADIN CLINIC 45 tablet 1  . warfarin (COUMADIN) 5 MG tablet TAKE ON SUNDAY, TUESDAY, THURSDAY, AND SATURDAY OR TAKE AS DIRECTED BY THE COUMADIN CLINIC. 60 tablet 1   No current facility-administered medications for this encounter.    Allergies  Allergen Reactions  . Metoprolol Shortness Of Breath, Diarrhea and Other (See Comments)    Fatigue   . Gemfibrozil Other (See Comments)    Severe fatigue  . Other Other (See Comments)    Numerous cancer drugs - unknown reaction  . Tamoxifen Other (See Comments)    Bone and body aches   . Tetracyclines & Related Other (See Comments)    Vaginal infections    . Toprol Xl [Metoprolol Tartrate] Other (See Comments)    Hair loss  . Verapamil Other (See Comments)    Unknown  . Vicodin [Hydrocodone-Acetaminophen] Nausea And Vomiting and Other (See Comments)    Patient reports terrible nausea; plain tylenol is ok with patient  . Aromasin [Exemestane] Other (See Comments)    Hair loss     Social History   Socioeconomic History  . Marital status: Single    Spouse name: Not on file  . Number of children: Not on file  . Years of education: Not on file  . Highest education level: Not  on file  Occupational History  . Not on file  Tobacco Use  . Smoking status: Former Smoker    Packs/day: 0.50    Years: 10.00    Pack years: 5.00    Types: Cigarettes    Quit date: 11/12/2003    Years since quitting: 16.9  . Smokeless tobacco: Never Used  Vaping Use  . Vaping Use: Never used  Substance and Sexual Activity  . Alcohol use: Yes    Comment: occasionally  . Drug use: No  . Sexual activity: Never    Birth control/protection: Post-menopausal  Other Topics Concern  . Not on file  Social History Narrative  . Not on file   Social Determinants of Health   Financial Resource Strain: Not on file  Food Insecurity: Not on file  Transportation Needs: Not on file  Physical Activity: Not on file  Stress: Not on file  Social Connections: Not on file  Intimate Partner Violence: Not on file    Family History  Problem Relation Age of Onset  . Anesthesia problems Mother   . Diabetes Mother   . Diabetes Father     ROS- All systems are reviewed and negative except as per the HPI above  Physical Exam: Vitals:   10/15/20 1352  BP: 134/60  Pulse: 81  Weight: 115.9 kg  Height: 5\' 5"  (1.651 m)   Wt Readings from Last 3 Encounters:  10/15/20 115.9 kg  10/10/20 115 kg  10/03/20 115.7 kg    Labs: Lab Results  Component Value Date   NA 140 10/12/2020   K 4.5 10/12/2020   CL 109 10/12/2020   CO2 22 10/12/2020   GLUCOSE 157 (H) 10/12/2020   BUN 29 (H) 10/12/2020   CREATININE 1.39 (H) 10/12/2020   CALCIUM 8.8 (L) 10/12/2020   MG 1.8 07/21/2018   Lab Results  Component Value Date   INR 2.5 (H) 10/12/2020   Lab Results  Component Value Date   CHOL  05/14/2008    146        ATP III CLASSIFICATION:  <  200     mg/dL   Desirable  200-239  mg/dL   Borderline High  >=240    mg/dL   High   HDL 27 (L) 05/14/2008   LDLCALC  05/14/2008    98        Total Cholesterol/HDL:CHD Risk Coronary Heart Disease Risk Table                     Men   Women  1/2 Average Risk    3.4   3.3   TRIG 105 05/14/2008    GEN- The patient is well appearing obese elderly female, alert and oriented x 3 today.   HEENT-head normocephalic, atraumatic, sclera clear, conjunctiva pink, hearing intact, trachea midline. Lungs- Clear to ausculation bilaterally, normal work of breathing Heart- Regular rate and rhythm, frequent ectopic beats, no murmurs, rubs or gallops  GI- soft, NT, ND, + BS Extremities- no clubbing, cyanosis. Non pitting lower extremity edema MS- no significant deformity or atrophy Skin- no rash or lesion Psych- euthymic mood, full affect Neuro- strength and sensation are intact   EKG- SR with frequent PVCs, HR 81, QRS 162, QTc 427   stre Lexi myoview- 10/04/20- Study Highlights    The left ventricular ejection fraction is moderately decreased (30-44%).  Nuclear stress EF: 38%.  There was no ST segment deviation noted during stress.  The study is normal.  This is a low risk study.   Normal pharmacologic nuclear stress test with no evidence for prior infarct or ischemia. Very frequent PACs and short runs of atrial tachycardia. LVEF appears underestimated, correlation with an echocardiogram is recommended.  Echo- 10/09/20- Dr. Radford Pax has not signed off yet-  1. Left ventricular ejection fraction, by estimation, is 70 to 75%. The  left ventricle has hyperdynamic function. The left ventricle has no  regional wall motion abnormalities. There is mild left ventricular  hypertrophy. Left ventricular diastolic  parameters are consistent with Grade II diastolic dysfunction  (pseudonormalization).  2. Right ventricular systolic function is normal. The right ventricular  size is normal. There is normal pulmonary artery systolic pressure.  3. Left atrial size was moderately dilated.  4. The mitral valve is normal in structure. Trivial mitral valve  regurgitation. No evidence of mitral stenosis.  5. The aortic valve is grossly normal. Aortic valve  regurgitation is not  visualized. Mild aortic valve stenosis.    Assessment and Plan: 1. Persistent afib S/p ablation 09/21/18 Patient in [place] today. A Zio patch has been mailed to her per Dr Radford Pax. Continue warfarin Continue diltiazem 120 mg BID  2. HTN Stable, no changes today.  3. Acute on chronic diastolic CHF Recent stress test did not show ischemia, diastolic dysfunction by echo  CXR at ED showed mild interstitial edema. Patient having symptoms of abdominal bloating and orthopnea.  Start Lasix 40 mg daily x3 days and 20 mg daily for 4 days after. Start KCl 10 meq daily while on Lasix.  4. CHA2DS2VASc score of at least 6  Continue  warfarin   5. PVCs Frequent PVCs noted on several ECGs, sometimes in bigeminal pattern. EF preserved on echo. Zio patch per Dr Radford Pax.  6. Dyspnea with exertion/chest heaviness  Out of proportion to afib burden If symptoms persist despite diureses, ? If she would need LHC. Will defer to primary cardiologist.    Follow up with Dr Theodosia Blender office next week and Dr Rayann Heman as scheduled.    [facility] [address]  [address] [place], [place] [address] [phone]

## 2020-10-17 ENCOUNTER — Ambulatory Visit (HOSPITAL_COMMUNITY): Payer: Medicare PPO | Admitting: Nurse Practitioner

## 2020-10-23 ENCOUNTER — Ambulatory Visit (INDEPENDENT_AMBULATORY_CARE_PROVIDER_SITE_OTHER): Payer: Medicare PPO

## 2020-10-23 ENCOUNTER — Emergency Department (HOSPITAL_COMMUNITY): Payer: Medicare PPO

## 2020-10-23 ENCOUNTER — Encounter (HOSPITAL_COMMUNITY): Payer: Self-pay | Admitting: Emergency Medicine

## 2020-10-23 ENCOUNTER — Ambulatory Visit (INDEPENDENT_AMBULATORY_CARE_PROVIDER_SITE_OTHER): Payer: Medicare PPO | Admitting: Cardiology

## 2020-10-23 ENCOUNTER — Other Ambulatory Visit: Payer: Self-pay

## 2020-10-23 ENCOUNTER — Encounter: Payer: Self-pay | Admitting: Cardiology

## 2020-10-23 ENCOUNTER — Inpatient Hospital Stay (HOSPITAL_COMMUNITY)
Admission: EM | Admit: 2020-10-23 | Discharge: 2020-10-27 | DRG: 286 | Disposition: A | Discharge status: Home / Self Care | Payer: Medicare PPO | Attending: Cardiology | Admitting: Cardiology

## 2020-10-23 VITALS — BP 148/56 | HR 98 | Ht 65.0 in | Wt 250.0 lb

## 2020-10-23 DIAGNOSIS — Z20822 Contact with and (suspected) exposure to covid-19: Secondary | ICD-10-CM | POA: Diagnosis present

## 2020-10-23 DIAGNOSIS — I350 Nonrheumatic aortic (valve) stenosis: Secondary | ICD-10-CM | POA: Diagnosis not present

## 2020-10-23 DIAGNOSIS — Z87891 Personal history of nicotine dependence: Secondary | ICD-10-CM | POA: Diagnosis not present

## 2020-10-23 DIAGNOSIS — E119 Type 2 diabetes mellitus without complications: Secondary | ICD-10-CM

## 2020-10-23 DIAGNOSIS — I480 Paroxysmal atrial fibrillation: Secondary | ICD-10-CM | POA: Diagnosis not present

## 2020-10-23 DIAGNOSIS — R0602 Shortness of breath: Secondary | ICD-10-CM | POA: Diagnosis not present

## 2020-10-23 DIAGNOSIS — I272 Pulmonary hypertension, unspecified: Secondary | ICD-10-CM | POA: Diagnosis present

## 2020-10-23 DIAGNOSIS — Z981 Arthrodesis status: Secondary | ICD-10-CM

## 2020-10-23 DIAGNOSIS — Z6841 Body Mass Index (BMI) 40.0 and over, adult: Secondary | ICD-10-CM | POA: Diagnosis not present

## 2020-10-23 DIAGNOSIS — Z923 Personal history of irradiation: Secondary | ICD-10-CM

## 2020-10-23 DIAGNOSIS — I10 Essential (primary) hypertension: Secondary | ICD-10-CM | POA: Diagnosis not present

## 2020-10-23 DIAGNOSIS — I2583 Coronary atherosclerosis due to lipid rich plaque: Secondary | ICD-10-CM

## 2020-10-23 DIAGNOSIS — I472 Ventricular tachycardia: Secondary | ICD-10-CM | POA: Diagnosis present

## 2020-10-23 DIAGNOSIS — Z7901 Long term (current) use of anticoagulants: Secondary | ICD-10-CM | POA: Diagnosis not present

## 2020-10-23 DIAGNOSIS — I5031 Acute diastolic (congestive) heart failure: Secondary | ICD-10-CM | POA: Diagnosis present

## 2020-10-23 DIAGNOSIS — Z7984 Long term (current) use of oral hypoglycemic drugs: Secondary | ICD-10-CM

## 2020-10-23 DIAGNOSIS — Z833 Family history of diabetes mellitus: Secondary | ICD-10-CM | POA: Diagnosis not present

## 2020-10-23 DIAGNOSIS — I4891 Unspecified atrial fibrillation: Secondary | ICD-10-CM | POA: Diagnosis not present

## 2020-10-23 DIAGNOSIS — G4733 Obstructive sleep apnea (adult) (pediatric): Secondary | ICD-10-CM | POA: Diagnosis not present

## 2020-10-23 DIAGNOSIS — I2510 Atherosclerotic heart disease of native coronary artery without angina pectoris: Secondary | ICD-10-CM

## 2020-10-23 DIAGNOSIS — I130 Hypertensive heart and chronic kidney disease with heart failure and stage 1 through stage 4 chronic kidney disease, or unspecified chronic kidney disease: Principal | ICD-10-CM | POA: Diagnosis present

## 2020-10-23 DIAGNOSIS — Z888 Allergy status to other drugs, medicaments and biological substances status: Secondary | ICD-10-CM

## 2020-10-23 DIAGNOSIS — I200 Unstable angina: Secondary | ICD-10-CM | POA: Diagnosis not present

## 2020-10-23 DIAGNOSIS — E559 Vitamin D deficiency, unspecified: Secondary | ICD-10-CM | POA: Diagnosis present

## 2020-10-23 DIAGNOSIS — I5032 Chronic diastolic (congestive) heart failure: Secondary | ICD-10-CM

## 2020-10-23 DIAGNOSIS — Z853 Personal history of malignant neoplasm of breast: Secondary | ICD-10-CM | POA: Diagnosis not present

## 2020-10-23 DIAGNOSIS — N1832 Chronic kidney disease, stage 3b: Secondary | ICD-10-CM

## 2020-10-23 DIAGNOSIS — I4819 Other persistent atrial fibrillation: Secondary | ICD-10-CM | POA: Diagnosis not present

## 2020-10-23 DIAGNOSIS — R0600 Dyspnea, unspecified: Secondary | ICD-10-CM | POA: Diagnosis not present

## 2020-10-23 DIAGNOSIS — Z5181 Encounter for therapeutic drug level monitoring: Secondary | ICD-10-CM | POA: Diagnosis not present

## 2020-10-23 DIAGNOSIS — N289 Disorder of kidney and ureter, unspecified: Secondary | ICD-10-CM | POA: Diagnosis present

## 2020-10-23 DIAGNOSIS — K219 Gastro-esophageal reflux disease without esophagitis: Secondary | ICD-10-CM | POA: Diagnosis present

## 2020-10-23 DIAGNOSIS — E7800 Pure hypercholesterolemia, unspecified: Secondary | ICD-10-CM

## 2020-10-23 DIAGNOSIS — Z791 Long term (current) use of non-steroidal anti-inflammatories (NSAID): Secondary | ICD-10-CM | POA: Diagnosis not present

## 2020-10-23 DIAGNOSIS — Z96652 Presence of left artificial knee joint: Secondary | ICD-10-CM | POA: Diagnosis present

## 2020-10-23 DIAGNOSIS — E1122 Type 2 diabetes mellitus with diabetic chronic kidney disease: Secondary | ICD-10-CM | POA: Diagnosis present

## 2020-10-23 DIAGNOSIS — I493 Ventricular premature depolarization: Secondary | ICD-10-CM | POA: Diagnosis present

## 2020-10-23 DIAGNOSIS — I4949 Other premature depolarization: Secondary | ICD-10-CM | POA: Diagnosis not present

## 2020-10-23 DIAGNOSIS — Z881 Allergy status to other antibiotic agents status: Secondary | ICD-10-CM

## 2020-10-23 DIAGNOSIS — Z885 Allergy status to narcotic agent status: Secondary | ICD-10-CM

## 2020-10-23 DIAGNOSIS — R079 Chest pain, unspecified: Secondary | ICD-10-CM | POA: Diagnosis not present

## 2020-10-23 HISTORY — DX: Unstable angina: I20.0

## 2020-10-23 HISTORY — DX: Cardiac arrhythmia, unspecified: I49.9

## 2020-10-23 LAB — TROPONIN I (HIGH SENSITIVITY)
Troponin I (High Sensitivity): 12 ng/L (ref <18)
Troponin I (High Sensitivity): 13 ng/L (ref <18)
Troponin I (High Sensitivity): 13 ng/L (ref <18)

## 2020-10-23 LAB — BRAIN NATRIURETIC PEPTIDE: B Natriuretic Peptide: 192.1 pg/mL — ABNORMAL HIGH (ref 0.0–100.0)

## 2020-10-23 LAB — CBC
HCT: 36.9 % (ref 36.0–46.0)
Hemoglobin: 11.1 g/dL — ABNORMAL LOW (ref 12.0–15.0)
MCH: 19 pg — ABNORMAL LOW (ref 26.0–34.0)
MCHC: 30.1 g/dL (ref 30.0–36.0)
MCV: 63.2 fL — ABNORMAL LOW (ref 80.0–100.0)
Platelets: 178 10*3/uL (ref 150–400)
RBC: 5.84 MIL/uL — ABNORMAL HIGH (ref 3.87–5.11)
RDW: 18.4 % — ABNORMAL HIGH (ref 11.5–15.5)
WBC: 7.8 10*3/uL (ref 4.0–10.5)
nRBC: 0 % (ref 0.0–0.2)

## 2020-10-23 LAB — BASIC METABOLIC PANEL
Anion gap: 8 (ref 5–15)
BUN: 29 mg/dL — ABNORMAL HIGH (ref 8–23)
CO2: 24 mmol/L (ref 22–32)
Calcium: 9.6 mg/dL (ref 8.9–10.3)
Chloride: 108 mmol/L (ref 98–111)
Creatinine, Ser: 1.49 mg/dL — ABNORMAL HIGH (ref 0.44–1.00)
GFR, Estimated: 36 mL/min — ABNORMAL LOW (ref >60)
Glucose, Bld: 141 mg/dL — ABNORMAL HIGH (ref 70–99)
Potassium: 4.8 mmol/L (ref 3.5–5.1)
Sodium: 140 mmol/L (ref 135–145)

## 2020-10-23 LAB — PROTIME-INR
INR: 2.6 — ABNORMAL HIGH (ref 0.8–1.2)
Prothrombin Time: 27 seconds — ABNORMAL HIGH (ref 11.4–15.2)

## 2020-10-23 LAB — TSH: TSH: 4.853 u[IU]/mL — ABNORMAL HIGH (ref 0.350–4.500)

## 2020-10-23 LAB — TROPONIN T: Troponin T (Highly Sensitive): 19 ng/L (ref 0–14)

## 2020-10-23 LAB — RESP PANEL BY RT-PCR (FLU A&B, COVID) ARPGX2
Influenza A by PCR: NEGATIVE
Influenza B by PCR: NEGATIVE
SARS Coronavirus 2 by RT PCR: NEGATIVE

## 2020-10-23 LAB — POCT INR: INR: 3.4 — AB (ref 2.0–3.0)

## 2020-10-23 MED ORDER — ZOLPIDEM TARTRATE 5 MG PO TABS: 5.0000 mg | ORAL_TABLET | Freq: Every evening | ORAL | Status: DC | PRN

## 2020-10-23 MED ORDER — SODIUM CHLORIDE 0.9% FLUSH
3.0000 mL | Freq: Two times a day (BID) | INTRAVENOUS | Status: DC
  Administered 2020-10-23 – 2020-10-25 (×3): 3 mL via INTRAVENOUS

## 2020-10-23 MED ORDER — CALCIUM CARBONATE-VITAMIN D3 600-400 MG-UNIT PO TABS: 1.0000 | ORAL_TABLET | Freq: Every day | ORAL | Status: DC

## 2020-10-23 MED ORDER — SODIUM CHLORIDE 0.9 % IV SOLN: 250.0000 mL | INTRAVENOUS | Status: DC | PRN

## 2020-10-23 MED ORDER — FUROSEMIDE 40 MG PO TABS
40.0000 mg | ORAL_TABLET | Freq: Every day | ORAL | Status: DC
  Administered 2020-10-23: 40 mg via ORAL
  Filled 2020-10-23: qty 2

## 2020-10-23 MED ORDER — SODIUM CHLORIDE 0.9% FLUSH: 3.0000 mL | INTRAVENOUS | Status: DC | PRN

## 2020-10-23 MED ORDER — CALCIUM CARBONATE-VITAMIN D 500-200 MG-UNIT PO TABS
1.0000 | ORAL_TABLET | Freq: Every day | ORAL | Status: DC
  Administered 2020-10-24 – 2020-10-27 (×4): 1 via ORAL
  Filled 2020-10-23 (×4): qty 1

## 2020-10-23 MED ORDER — ASPIRIN 81 MG PO CHEW
324.0000 mg | CHEWABLE_TABLET | Freq: Once | ORAL | Status: AC
  Administered 2020-10-23: 324 mg via ORAL
  Filled 2020-10-23: qty 4

## 2020-10-23 MED ORDER — ROSUVASTATIN CALCIUM 5 MG PO TABS: 5.0000 mg | ORAL_TABLET | ORAL | Status: DC

## 2020-10-23 MED ORDER — DIPHENHYDRAMINE HCL 25 MG PO CAPS: 25.0000 mg | ORAL_CAPSULE | ORAL | Status: DC | PRN

## 2020-10-23 MED ORDER — NITROGLYCERIN 0.4 MG SL SUBL
0.4000 mg | SUBLINGUAL_TABLET | SUBLINGUAL | Status: DC | PRN
  Administered 2020-10-23 (×2): 0.4 mg via SUBLINGUAL
  Filled 2020-10-23 (×2): qty 1

## 2020-10-23 MED ORDER — ALPRAZOLAM 0.25 MG PO TABS: 0.2500 mg | ORAL_TABLET | Freq: Two times a day (BID) | ORAL | Status: DC | PRN

## 2020-10-23 MED ORDER — DILTIAZEM HCL ER COATED BEADS 120 MG PO CP24
120.0000 mg | ORAL_CAPSULE | Freq: Two times a day (BID) | ORAL | Status: DC
  Administered 2020-10-23 – 2020-10-27 (×8): 120 mg via ORAL
  Filled 2020-10-23 (×9): qty 1

## 2020-10-23 MED ORDER — DILTIAZEM HCL ER COATED BEADS 120 MG PO CP24: 120.0000 mg | ORAL_CAPSULE | Freq: Two times a day (BID) | ORAL | 3 refills | Status: DC

## 2020-10-23 MED ORDER — ONDANSETRON HCL 4 MG/2ML IJ SOLN
4.0000 mg | Freq: Four times a day (QID) | INTRAMUSCULAR | Status: DC | PRN
  Administered 2020-10-24 – 2020-10-27 (×2): 4 mg via INTRAVENOUS
  Filled 2020-10-23 (×2): qty 2

## 2020-10-23 MED ORDER — DIPHENHYDRAMINE-APAP (SLEEP) 25-500 MG PO TABS: 2.0000 | ORAL_TABLET | ORAL | Status: DC | PRN

## 2020-10-23 MED ORDER — CELECOXIB 200 MG PO CAPS
200.0000 mg | ORAL_CAPSULE | Freq: Every day | ORAL | Status: DC
  Administered 2020-10-24 – 2020-10-27 (×3): 200 mg via ORAL
  Filled 2020-10-23 (×5): qty 1

## 2020-10-23 MED ORDER — FUROSEMIDE 40 MG PO TABS: 40.0000 mg | ORAL_TABLET | Freq: Every day | ORAL | 3 refills | Status: DC

## 2020-10-23 MED ORDER — ACETAMINOPHEN 325 MG PO TABS
650.0000 mg | ORAL_TABLET | ORAL | Status: DC | PRN
  Administered 2020-10-26: 650 mg via ORAL
  Filled 2020-10-23: qty 2

## 2020-10-23 NOTE — Progress Notes (Signed)
Cardiology Office Note:    Date:  10/23/2020   ID:  Jenna Mcdonald, DOB 1945/10/01, MRN [id number]  PCP:  Josetta Huddle, MD  Cardiologist:  Fransico Him, MD    Referring MD: Josetta Huddle, MD   Chief Complaint  Patient presents with  . Sleep Apnea  . Hypertension  . Atrial Fibrillation  . Aortic Stenosis    History of Present Illness:    Jenna Mcdonald is a 75 y.o. female with a hx of OSA on BiPAP, HTN, PAF s/p afib ablation 2020, mild AS by echo 1/2018and morbid obesity.  2D echo 08/2018 showed normal LVF with mild AS and mild MR.   When I last saw her she was having problems with DOE and her chest feeling tight and then her heart starts beating fast.  This occured with just walking down her driveway ([id number]).  Jenna Mcdonald 10/04/2020 showed no ischemia and 2D echo showed normal heart function with diastolic dysfunction and moderate LAE with mild AS and stable from prior echo.   She was recently seen back in [facility] 10/10/2020 and was noted to have frequent PVCs which were noted at time of stress test and her 2D echo.  She also reported that she had gained over 20lbs in the past year.  She then presented to the ER 10/12/20 with complaints of chest pain and DOE and ruled out with normal hsTrop but did have vascular congestion on CXR but refused Lasix and was discharged home.  She was having bigeminal PVCs at that time. A Zio Patch was ordered to assess PVC load.  She was seen back in afib clinic on 10/15/20 and was complaining of abdominal bloating and was started on Lasix 40mg  daily for 3 days and then taper down to 20mg  daily.   She is now here for followup.  She is having a lot of problems still with SOB.  She ran out of Lasix on Sunday and is now having more SOB.  Her SOB improved on Lasix. She still is eating some salty foods.    She continues to have chest heaviness that is intermittent and awakened with chest pain this am which she still has.  She is very SOB and  feels like she cannot get a deep breath.  She has not had any LE edema but some abdominal fullness.  She is aware of heart beats but has not really paid attention to if her heart is skipping.  She is compliant with her meds and is tolerating meds with no SE.    She is doing well with her CPAP device and thinks that she has gotten used to it.  She tolerates the mask and feels the pressure is adequate.  Since going on CPAP she feels rested in the am for the most part if she wakes up too much during the night and has no significant daytime sleepiness.  She is having some problems with running nose and blood tinged nasal secretions.    She does not think that he snores.    Past Medical History:  Diagnosis Date  . Anemia   . Aortic stenosis    mild by echo 09/2020 with mean AVG 29mmHg  . Breast CA ([place])    left  . Breast cancer ([place])   . Carotid stenosis    1-39% left  . Degenerative disc disease, lumbar    of the knees  . Diabetes mellitus   . 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[place])   . Fatigue  Severe secondary to to gemfibrozil  . Gastroesophageal reflux disease    denies  . GI bleeding    a. 06/2018: EGD showed multiple nonbleeding duodenal ulcers. 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  . Hammertoe    left second toe  . Hypercholesteremia   . Hypertension   . Insomnia   . Irritable bowel   . Low back pain   . Metabolic syndrome   . Morbid obesity ([place])   . OSA on CPAP \   bipap  . Osteoarthritis   . Osteopenia   . Persistent atrial fibrillation [facility])    s/p afib ablation - Dr. Rayann Heman 08/2018  . Personal history of radiation therapy   . Pneumonia   . PONV (postoperative nausea and vomiting)   . Sigmoid diverticulosis   . Thalassemia minor   . Vitamin D deficiency     Past Surgical History:  Procedure Laterality Date  . ATRIAL FIBRILLATION ABLATION N/A 09/21/2018   Procedure: ATRIAL FIBRILLATION ABLATION;  Surgeon:  Thompson Grayer, MD;  Location: [place] CV LAB;  Service: Cardiovascular;  Laterality: N/A;  . BIOPSY  07/06/2018   Procedure: BIOPSY;  Surgeon: Clarene Essex, MD;  Location: [place];  Service: Endoscopy;;  . BREAST LUMPECTOMY Left 05/28/2009  . BREAST SURGERY Left 10   lumpectomy  . CARDIOVERSION N/A 07/07/2018   Procedure: CARDIOVERSION;  Surgeon: [name], MD;  Location: [facility] ENDOSCOPY;  Service: Cardiovascular;  Laterality: N/A;  . CARDIOVERSION N/A 07/14/2018   Procedure: CARDIOVERSION;  Surgeon: Larey Dresser, MD;  Location: [facility] ENDOSCOPY;  Service: Cardiovascular;  Laterality: N/A;  . CESAREAN SECTION    . COLONOSCOPY WITH PROPOFOL N/A 07/06/2018   Procedure: COLONOSCOPY WITH PROPOFOL;  Surgeon: Clarene Essex, MD;  Location: [place];  Service: Endoscopy;  Laterality: N/A;  . CYSTECTOMY  70   pilonidal   . DILATION AND CURETTAGE OF UTERUS    . ESOPHAGOGASTRODUODENOSCOPY (EGD) WITH PROPOFOL N/A 07/06/2018   Procedure: ESOPHAGOGASTRODUODENOSCOPY (EGD) WITH PROPOFOL;  Surgeon: Clarene Essex, MD;  Location: [place];  Service: Endoscopy;  Laterality: N/A;  . EYE SURGERY Bilateral 12/14   cataracts  . HOT HEMOSTASIS N/A 07/06/2018   Procedure: HOT HEMOSTASIS (ARGON PLASMA COAGULATION/BICAP);  Surgeon: Clarene Essex, MD;  Location: [place];  Service: Endoscopy;  Laterality: N/A;  . MAXIMUM ACCESS (MAS)POSTERIOR LUMBAR INTERBODY FUSION (PLIF) 2 LEVEL N/A 09/22/2013   Procedure: FOR MAXIMUM ACCESS  POSTERIOR LUMBAR INTERBODY FUSION LUMBAR THREE-FOUR FOUR-FIVE;  Surgeon: Eustace Moore, MD;  Location: [facility] NEURO ORS;  Service: Neurosurgery;  Laterality: N/A;  . OOPHORECTOMY     wedge section not rem  . 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 REPLACEMENT TOTAL KNEE    . TEE WITHOUT CARDIOVERSION N/A 07/07/2018   Procedure: TRANSESOPHAGEAL ECHOCARDIOGRAM (TEE);  Surgeon: [name], MD;  Location: [place];  Service: Cardiovascular;  Laterality: N/A;  . TEE WITHOUT  CARDIOVERSION N/A 09/21/2018   Procedure: TRANSESOPHAGEAL ECHOCARDIOGRAM (TEE);  Surgeon: Acie Fredrickson Wonda Cheng, MD;  Location: [facility] ENDOSCOPY;  Service: Cardiovascular;  Laterality: N/A;  . TONSILLECTOMY    . TUBAL LIGATION      Current Medications: Current Meds  Medication Sig  . Calcium Carbonate-Vitamin D (CALCIUM 600 + D PO) Take 1 tablet by mouth daily.   . celecoxib (CELEBREX) 200 MG capsule Take 200 mg by mouth daily.  . Cholecalciferol (VITAMIN D3) 25 MCG (1000 UT) CAPS Take by mouth.  . diclofenac Sodium (VOLTAREN) 1 % GEL Apply 4  g topically daily.  Marland Kitchen diltiazem (CARDIZEM CD) 120 MG 24 hr capsule Take 1 capsule (120 mg total) by mouth in the morning and at bedtime.  . diphenhydramine-acetaminophen (TYLENOL PM) 25-500 MG TABS tablet Take 2 tablets by mouth as needed (sleep).   Marland Kitchen glimepiride (AMARYL) 4 MG tablet 0.5 TABLET ORALLY EVERY EVENING 90 DAYS  . metFORMIN (GLUCOPHAGE-XR) 500 MG 24 hr tablet Take 500 mg by mouth 2 (two) times daily.   Glory Rosebush ULTRA test strip 1 each by Other route daily.  . ORTHOVISC 30 MG/2ML SOSY 1 syringe intraarticularly once a week  . rosuvastatin (CRESTOR) 5 MG tablet Take 5 mg by mouth once a week. Pt states she is taking weekly per Dr. Inda Merlin.  . valsartan (DIOVAN) 160 MG tablet 1 tablet  . warfarin (COUMADIN) 4 MG tablet TAKE ON MONDAY, WEDNESDAY AND FRIDAY AS DIRECTED BY THE COUMADIN CLINIC  . warfarin (COUMADIN) 5 MG tablet TAKE ON SUNDAY, TUESDAY, THURSDAY, AND SATURDAY OR TAKE AS DIRECTED BY THE COUMADIN CLINIC.     Allergies:   Metoprolol, Gemfibrozil, Other, Tamoxifen, Tetracyclines & related, Toprol xl [metoprolol tartrate], Verapamil, Vicodin [hydrocodone-acetaminophen], and Aromasin [exemestane]   Social History   Socioeconomic History  . Marital status: Single    Spouse name: Not on file  . Number of children: Not on file  . Years of education: Not on file  . Highest education level: Not on file  Occupational History  . Not on file   Tobacco Use  . Smoking status: Former Smoker    Packs/day: 0.50    Years: 10.00    Pack years: 5.00    Types: Cigarettes    Quit date: 11/12/2003    Years since quitting: 16.9  . Smokeless tobacco: Never Used  Vaping Use  . Vaping Use: Never used  Substance and Sexual Activity  . Alcohol use: Yes    Comment: occasionally  . Drug use: No  . Sexual activity: Never    Birth control/protection: Post-menopausal  Other Topics Concern  . Not on file  Social History Narrative  . Not on file   Social Determinants of Health   Financial Resource Strain: Not on file  Food Insecurity: Not on file  Transportation Needs: Not on file  Physical Activity: Not on file  Stress: Not on file  Social Connections: Not on file     Family History: The patient's family history includes Anesthesia problems in her mother; Diabetes in her father and mother.  ROS:   Please see the history of present illness.    ROS  All other systems reviewed and negative.   EKGs/Labs/Other Studies Reviewed:    The following studies were reviewed today: PAP compliance download  Jenna Mcdonald 10/04/2020 Study Highlights    The left ventricular ejection fraction is moderately decreased (30-44%).  Nuclear stress EF: 38%.  There was no ST segment deviation noted during stress.  The study is normal.  This is a low risk study.   Normal pharmacologic nuclear stress test with no evidence for prior infarct or ischemia. Very frequent PACs and short runs of atrial tachycardia. LVEF appears underestimated, correlation with an echocardiogram is recommended.  2D echo 10/09/2020 IMPRESSIONS  1. Left ventricular ejection fraction, by estimation, is 70 to 75%. The  left ventricle has hyperdynamic function. The left ventricle has no  regional wall motion abnormalities. There is mild left ventricular  hypertrophy. Left ventricular diastolic  parameters are consistent with Grade II diastolic dysfunction   (pseudonormalization).  2. Right ventricular systolic function is normal. The right ventricular  size is normal. There is normal pulmonary artery systolic pressure.  3. Left atrial size was moderately dilated.  4. The mitral valve is normal in structure. Trivial mitral valve  regurgitation. No evidence of mitral stenosis.  5. The aortic valve is grossly normal. Aortic valve regurgitation is not  visualized. Mild aortic valve stenosis.    EKG:  EKG is  ordered today and showed NSR with PACs and PVCs  Recent Labs: 10/12/2020: BUN 29; Creatinine, Ser 1.39; Hemoglobin 10.6; Platelets 166; Potassium 4.5; Sodium 140   Recent Lipid Panel    Component Value Date/Time   CHOL  05/14/2008 0435    146        ATP III CLASSIFICATION:  <200     mg/dL   Desirable  200-239  mg/dL   Borderline High  >=240    mg/dL   High   TRIG 105 05/14/2008 0435   HDL 27 (L) 05/14/2008 0435   CHOLHDL 5.4 05/14/2008 0435   VLDL 21 05/14/2008 0435   LDLCALC  05/14/2008 0435    98        Total Cholesterol/HDL:CHD Risk Coronary Heart Disease Risk Table                     Men   Women  1/2 Average Risk   3.4   3.3    Physical Exam:    VS:  BP (!) 148/56   Pulse 98   Ht 5\' 5"  (1.651 m)   Wt 250 lb (113.4 kg)   BMI 41.60 kg/m     Wt Readings from Last 3 Encounters:  10/23/20 250 lb (113.4 kg)  10/15/20 255 lb 9.6 oz (115.9 kg)  10/10/20 253 lb 9.6 oz (115 kg)     GEN:  Well nourished, well developed in no acute distress HEENT: Normal NECK: No JVD LYMPHATICS: No lymphadenopathy CARDIAC: RRR, no rubs, gallops.  2/6 SM at RUSB radiating into the carotid arteries bilaterally.  Frequent ectopy RESPIRATORY:  Clear to auscultation without rales, wheezing or rhonchi  ABDOMEN: Soft, non-tender, non-distended MUSCULOSKELETAL:  No edema; No deformity  SKIN: Warm and dry NEUROLOGIC:  Alert and oriented x 3 PSYCHIATRIC:  Normal affect   ASSESSMENT:    1. OSA (obstructive sleep apnea)   2.  Nonrheumatic aortic valve stenosis   3. Essential hypertension   4. Persistent atrial fibrillation (HCC)   5. Obesity, Class III, BMI 40-49.9 (morbid obesity) ([place])   6. Type 2 diabetes mellitus without complication, without long-term current use of insulin (HCC)   7. Coronary artery disease due to lipid rich plaque   8. PVC (premature ventricular contraction)   9. DOE (dyspnea on exertion)   10. Chronic diastolic heart failure ([place])   11. Pure hypercholesterolemia    PLAN:    In order of problems listed above:  1. OSA -  The patient is tolerating PAP therapy well without any problems. The PAP download was reviewed today and showed an AHI of 5.1/hr on 12/8 cm H2O with 100% compliance in using more than 4 hours nightly.  The patient has been using and benefiting from PAP use and will continue to benefit from therapy.   2.  Aortic stenosis -2D echo 08/2018 showed very mild AS with mean AVG 62mmHg. -repeat echo 10/09/2020 was stable with mean AVG 11.64mmHg  3.  HTN -BP is borderline controlled on exam today -continue Cardizem CD 120mg  BID and  Valsartan 160mg  daily  4.  PAF -s/p afib ablation 08/2018 -she is maintaining NSR on exam today -continue Cardizem and warfarin>>Cardizem was increased to 120mg  BID -she denies any bleeding problems on warfarin  5.  Obesity -I have encouraged her to get into a routine exercise program and cut back on carbs and portions.  -her exercise is limited by back pain  6.  Type 2 DM -followed by PCP -continue metformin 500mg  BID and Glimepiride  7.  Chest pain/ASCAD -she has recently had problems with chest tightness and DOE with minimal exertion walking down her driveway -she has known CAD with coronary CTA 08/2018 showing 25-49% LAD stenosis -Jenna Mcdonald 09/2020 showed no ischemia -she continues to feel poorly with chest pain and DOE and I'm not sure what the etiology is>>may be the frequent ectopy -check hsTrop today -I think at this point we  need to set her up for right and left heart catheterization to define coronary anatomy and assess filling pressure -Shared Decision Making/Informed Consent The risks [stroke (1 in 1000), death (1 in 1000), kidney failure [usually temporary] (1 in 500), bleeding (1 in 200), allergic reaction [possibly serious] (1 in 200)], benefits (diagnostic support and management of coronary artery disease) and alternatives of a cardiac catheterization were discussed in detail with Jenna Mcdonald and she is willing to proceed. -no ASA due Warfarin -continue statin  8.  PVCs -noted recently on stress test, echo and EKG -ziopatch pending -continue Cardizem for now  9.  DOE -suspect this is multifactorial from obesity, sedentary state, PVCs, chronic diastolic CHF -see #5>>OIBB get right and left heart cath  10.  Chronic diastolic CHF -Her breathing improved on Lasix but ran out on SUnday -I will place her back on Lasix 40mg  daily -check BMET, TSH and BNP today  11.  HLD -LDL goal < 70 -continue Crestor 5mg  weekly -LDL was 54 in June 2021  Medication Adjustments/Labs and Tests Ordered: Current medicines are reviewed at length with the patient today.  Concerns regarding medicines are outlined above.  Orders Placed This Encounter  Procedures  . EKG 12-Lead   No orders of the defined types were placed in this encounter.   Signed, Fransico Him, MD  10/23/2020 12:32 PM    [facility]

## 2020-10-23 NOTE — ED Triage Notes (Signed)
Patient coming from home. Complaint of chest pressure that has been intermittent since Sunday, patient endorses shortness of breath associated with the chest pressure. Patient reports being put on lasix by afib clinic last week and finishing on Sunday has felt bad since. VSS.

## 2020-10-23 NOTE — Progress Notes (Signed)
ANTICOAGULATION CONSULT NOTE - Initial Consult  Pharmacy Consult for heparin Indication: chest pain/ACS  Allergies  Allergen Reactions  . Metoprolol Shortness Of Breath, Diarrhea and Other (See Comments)    Fatigue   . Gemfibrozil Other (See Comments)    Severe fatigue  . Other Other (See Comments)    Numerous cancer drugs - unknown reaction  . Tamoxifen Other (See Comments)    Bone and body aches   . Tetracyclines & Related Other (See Comments)    Vaginal infections    . Toprol Xl [Metoprolol Tartrate] Other (See Comments)    Hair loss  . Verapamil Other (See Comments)    Unknown  . Vicodin [Hydrocodone-Acetaminophen] Nausea And Vomiting and Other (See Comments)    Patient reports terrible nausea; plain tylenol is ok with patient  . Aromasin [Exemestane] Other (See Comments)    Hair loss     Patient Measurements:   Heparin Dosing Weight: 83 kg   Vital Signs: Temp: 99.1 F (37.3 C) (03/01 2040) Temp Source: Oral (03/01 2040) BP: 118/59 (03/01 2040) Pulse Rate: 68 (03/01 2040)  Labs: Recent Labs    10/23/20 1133 10/23/20 1329 10/23/20 1714 10/23/20 1914 10/23/20 2135  HGB  --  11.7 11.1*  --   --   HCT  --  40.3 36.9  --   --   PLT  --  229 178  --   --   LABPROT  --   --   --   --  27.0*  INR 3.4*  --   --   --  2.6*  CREATININE  --  WILL FOLLOW 1.49*  --   --   TROPONINIHS  --   --  12 13 13     Estimated Creatinine Clearance: 41 mL/min (A) (by C-G formula based on SCr of 1.49 mg/dL (H)).   Medical History: Past Medical History:  Diagnosis Date  . Anemia   . Aortic stenosis    mild by echo 09/2020 with mean AVG 17mmHg  . Breast CA ([place])    left  . Breast cancer ([place])   . Carotid stenosis    1-39% left  . Degenerative disc disease, lumbar    of the knees  . Diabetes mellitus   . 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[place])   . Fatigue    Severe secondary to to gemfibrozil  . Gastroesophageal reflux disease    denies  . GI bleeding    a. 06/2018: EGD  showed multiple nonbleeding duodenal ulcers. 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  . Hammertoe    left second toe  . Hypercholesteremia   . Hypertension   . Insomnia   . Irritable bowel   . Low back pain   . Metabolic syndrome   . Morbid obesity ([place])   . OSA on CPAP \   bipap  . Osteoarthritis   . Osteopenia   . Persistent atrial fibrillation [facility])    s/p afib ablation - Dr. Rayann Heman 08/2018  . Personal history of radiation therapy   . Pneumonia   . PONV (postoperative nausea and vomiting)   . Sigmoid diverticulosis   . Thalassemia minor   . Vitamin D deficiency     Medications:  (Not in a hospital admission)   Assessment: 7 YOF complaining of chest pressure on warfarin at home. Pharmacy consulted to start IV heparin for ACS when INR drifts to less than 2. INR on admission is 2.6  H/H low, Plt  wnl   Goal of Therapy:  Heparin level 0.3-0.7 units/ml Monitor platelets by anticoagulation protocol: Yes   Plan:  -Recheck INR in AM. Will plan to start IV heparin when INR is less than 2   Albertina Parr, PharmD., BCPS, BCCCP Clinical Pharmacist Please refer to [facility] for unit-specific pharmacist

## 2020-10-23 NOTE — Patient Instructions (Signed)
Medication Instructions:  Your physician has recommended you make the following change in your medication: 1) Start taking Lasix (furosemide) 40 mg daily  *If you need a refill on your cardiac medications before your next appointment, please call your pharmacy*   Lab Work: TODAY: BMET, BNP, TSH, CBC, troponin If you have labs (blood work) drawn today and your tests are completely normal, you will receive your results only by: Marland Kitchen MyChart Message (if you have MyChart) OR . A paper copy in the mail If you have any lab test that is abnormal or we need to change your treatment, we will call you to review the results.   Testing/Procedures: Your physician has requested that you have a cardiac catheterization. Cardiac catheterization is used to diagnose and/or treat various heart conditions. Doctors may recommend this procedure for a number of different reasons. The most common reason is to evaluate chest pain. Chest pain can be a symptom of coronary artery disease (CAD), and cardiac catheterization can show whether plaque is narrowing or blocking your heart's arteries. This procedure is also used to evaluate the valves, as well as measure the blood flow and oxygen levels in different parts of your heart. For further information please visit [email or web address][facility][email or web address]. Please follow instruction sheet, as given.  Follow-Up: At [facility], you and your health needs are our priority.  As part of our continuing mission to provide you with exceptional heart care, we have created designated Provider Care Teams.  These Care Teams include your primary Cardiologist (physician) and Advanced Practice Providers (APPs -  Physician Assistants and Nurse Practitioners) who all work together to provide you with the care you need, when you need it.  Your next appointment:   2 week(s)  The format for your next appointment:   In Person  Provider:   You may see Fransico Him, MD or one of the following Advanced Practice  Providers on your designated Care Team:    Melina Copa, PA-C  Ermalinda Barrios, PA-C  Other Instructions    [facility] OFFICE [place], [address] [place] [address] Dept: [phone] Loc: [place]  10/23/2020  You are scheduled for a Cardiac Catheterization on Friday, March 4 with Dr. [name].  1. Please arrive at the [facility] (Main Entrance A) at [facility]: [address] [place], [place] [address] at 7:00 AM (This time is two hours before your procedure to ensure your preparation). Free valet parking service is available.   Special note: Every effort is made to have your procedure done on time. Please understand that emergencies sometimes delay scheduled procedures.  2. Diet: Do not eat solid foods after midnight.  The patient may have clear liquids until 5am upon the day of the procedure.  3. Labs: You will need to have blood drawn today  4. COVID Screening: Due to recent COVID-19 restrictions implemented by our local and state authorities and in an effort to keep both patients and staff as safe as possible, our hospital system requires COVID-19 testing prior to certain scheduled hospital procedures.  Please go to [place] [place]. [place], [place] [address] on Wednesday 3/2 at 12:45pm.  This is a drive up testing site.  You will not need to exit your vehicle. You must agree to self-quarantine from the time of your testing until the procedure date on 3/4.  This should included staying home with ONLY the people you live  with.  Avoid take-out, grocery store shopping or leaving the house for any non-emergent reason.  Failure to have your COVID-19 test done on the date and time you have been scheduled will result in cancellation of your procedure.  Please call our office at [phone] if you have any questions.   4. Medication instructions in preparation  for your procedure:   Contrast Allergy: No  Stop taking Coumadin (Warfarin) on Tuesday, March 1.  Stop taking, Diovan (Valsartan) Thursday, March 3,   Stop taking, Celebrex Thursday March 3  Do not take Diabetes Med Glucophage (Metformin) on the day of the procedure and HOLD 48 HOURS AFTER THE PROCEDURE.  On the morning of your procedure, take your Aspirin and any morning medicines NOT listed above.  You may use sips of water.  5. Plan for one night stay--bring personal belongings. 6. Bring a current list of your medications and current insurance cards. 7. You MUST have a responsible person to drive you home. 8. Someone MUST be with you the first 24 hours after you arrive home or your discharge will be delayed. 9. Please wear clothes that are easy to get on and off and wear slip-on shoes.  Thank you for allowing [place] to care for you!   -- [facility]

## 2020-10-23 NOTE — Patient Instructions (Addendum)
Description   Skip today's dosage of Warfarin, then resume same dosage 5mg  daily, except for the 4mg  on Mondays, Wednesdays and Fridays. Recheck INR in 4 weeks. Coumadin Clinic [phone] Main [phone]

## 2020-10-23 NOTE — ED Notes (Signed)
RN messaged pharmacy per verification of due medications

## 2020-10-23 NOTE — ED Provider Notes (Signed)
[facility] EMERGENCY DEPARTMENT Provider Note   CSN: [id number] Arrival date & time: 10/23/20  1646     History Chief Complaint  Patient presents with  . Chest Pain    Jenna Mcdonald is a 75 y.o. female.  75 yo F with a chief complaint of chest pain.  This feels like a pressure across the center of her chest.  She denies any radiation.  This is been going on off and on for the past few weeks.  She had been seen in the ED and was told she likely had heart failure and was seen by cardiology as an outpatient and started on Lasix with transient improvement.  Feels like it really got worse yesterday.  She has shortness of breath with this.  Denies diaphoresis nausea or vomiting.  Saw her cardiologist today and they had planned to do a cardiac catheterization in 3 days time.  She had a troponin that resulted elevated and she was told to come to the ED.  She still feels a pressure to her chest now.  Denies cough congestion or fever.  Denies nausea vomiting or diarrhea.  Denies abdominal pain.  The history is provided by the patient.  Chest Pain Pain location:  Substernal area Pain quality: pressure   Pain radiates to:  Does not radiate Pain severity:  Moderate Onset quality:  Gradual Duration:  1 day Timing:  Constant Progression:  Worsening Chronicity:  Recurrent Relieved by:  Rest Worsened by:  Exertion Ineffective treatments:  None tried Associated symptoms: shortness of breath   Associated symptoms: no abdominal pain, no dizziness, no fever, no headache, no nausea, no palpitations and no vomiting        Past Medical History:  Diagnosis Date  . Anemia   . Aortic stenosis    mild by echo 09/2020 with mean AVG 75mmHg  . Breast CA ([place])    left  . Breast cancer ([place])   . Carotid stenosis    1-39% left  . Degenerative disc disease, lumbar    of the knees  . Diabetes mellitus   . 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[place])   . Fatigue    Severe secondary to to  gemfibrozil  . Gastroesophageal reflux disease    denies  . GI bleeding    a. 06/2018: EGD showed multiple nonbleeding duodenal ulcers. 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  . Hammertoe    left second toe  . Hypercholesteremia   . Hypertension   . Insomnia   . Irritable bowel   . Low back pain   . Metabolic syndrome   . Morbid obesity ([place])   . OSA on CPAP \   bipap  . Osteoarthritis   . Osteopenia   . Persistent atrial fibrillation [facility])    s/p afib ablation - Dr. Rayann Heman 08/2018  . Personal history of radiation therapy   . Pneumonia   . PONV (postoperative nausea and vomiting)   . Sigmoid diverticulosis   . Thalassemia minor   . Vitamin D deficiency     Patient Active Problem List   Diagnosis Date Noted  . Unstable angina ([place]) 10/23/2020  . Secondary hypercoagulable state ([place]) 10/15/2020  . Encounter for therapeutic drug monitoring 08/17/2018  . CKD (chronic kidney disease) stage 3, GFR 30-59 ml/min (HCC) 07/21/2018  . Chronic anemia 07/21/2018  . Anemia   . Heme positive stool   . Atrial fibrillation with RVR ([place]) 07/04/2018  . Carotid  stenosis   . Bruit of left carotid artery 09/22/2017  . Aortic stenosis   . Heart murmur 09/09/2016  . Morbid obesity ([place]) 02/05/2016  . Dry eye 05/17/2015  . Type 2 diabetes mellitus without complication ([place]) 33/00/7622  . Breast cancer, left breast ([place]) 07/22/2014  . Obesity, Class III, BMI 40-49.9 (morbid obesity) ([place]) 07/06/2014  . 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 History of surgical procedure 05/17/2014  . OSA (obstructive sleep apnea) 11/17/2013  . S/P lumbar spinal fusion 09/22/2013  . Persistent atrial fibrillation ([place]) 09/11/2011    Class: Acute  . 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[place]) 09/11/2011    Class: Chronic  . Essential hypertension 09/11/2011  . Exogenous obesity 09/11/2011  . Gastroesophageal reflux disease 09/11/2011  . Thalassemia minor  09/11/2011    Class: Chronic  . Breast cancer ([place]) 09/11/2011    Class: Chronic  . Degenerative joint disease 09/11/2011    Class: Chronic  . Irritable bowel disease 09/11/2011    Class: Chronic  . Lumbar disc disease 09/11/2011    Past Surgical History:  Procedure Laterality Date  . ATRIAL FIBRILLATION ABLATION N/A 09/21/2018   Procedure: ATRIAL FIBRILLATION ABLATION;  Surgeon: Thompson Grayer, MD;  Location: [facility] CV LAB;  Service: Cardiovascular;  Laterality: N/A;  . BIOPSY  07/06/2018   Procedure: BIOPSY;  Surgeon: Clarene Essex, MD;  Location: [facility];  Service: Endoscopy;;  . BREAST LUMPECTOMY Left 05/28/2009  . BREAST SURGERY Left 10   lumpectomy  . CARDIOVERSION N/A 07/07/2018   Procedure: CARDIOVERSION;  Surgeon: [name], MD;  Location: [facility] ENDOSCOPY;  Service: Cardiovascular;  Laterality: N/A;  . CARDIOVERSION N/A 07/14/2018   Procedure: CARDIOVERSION;  Surgeon: Larey Dresser, MD;  Location: [facility] ENDOSCOPY;  Service: Cardiovascular;  Laterality: N/A;  . CESAREAN SECTION    . COLONOSCOPY WITH PROPOFOL N/A 07/06/2018   Procedure: COLONOSCOPY WITH PROPOFOL;  Surgeon: Clarene Essex, MD;  Location: [place];  Service: Endoscopy;  Laterality: N/A;  . CYSTECTOMY  70   pilonidal   . DILATION AND CURETTAGE OF UTERUS    . ESOPHAGOGASTRODUODENOSCOPY (EGD) WITH PROPOFOL N/A 07/06/2018   Procedure: ESOPHAGOGASTRODUODENOSCOPY (EGD) WITH PROPOFOL;  Surgeon: Clarene Essex, MD;  Location: [place] [facility];  Service: Endoscopy;  Laterality: N/A;  . EYE SURGERY Bilateral 12/14   cataracts  . HOT HEMOSTASIS N/A 07/06/2018   Procedure: HOT HEMOSTASIS (ARGON PLASMA COAGULATION/BICAP);  Surgeon: Clarene Essex, MD;  Location: [place];  Service: Endoscopy;  Laterality: N/A;  . MAXIMUM ACCESS (MAS)POSTERIOR LUMBAR INTERBODY FUSION (PLIF) 2 LEVEL N/A 09/22/2013   Procedure: FOR MAXIMUM ACCESS  POSTERIOR LUMBAR INTERBODY FUSION LUMBAR THREE-FOUR FOUR-FIVE;  Surgeon: Eustace Moore,  MD;  Location: [facility] NEURO ORS;  Service: Neurosurgery;  Laterality: N/A;  . OOPHORECTOMY     wedge section not rem  . 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 REPLACEMENT TOTAL KNEE    . TEE WITHOUT CARDIOVERSION N/A 07/07/2018   Procedure: TRANSESOPHAGEAL ECHOCARDIOGRAM (TEE);  Surgeon: [name], MD;  Location: [place];  Service: Cardiovascular;  Laterality: N/A;  . TEE WITHOUT CARDIOVERSION N/A 09/21/2018   Procedure: TRANSESOPHAGEAL ECHOCARDIOGRAM (TEE);  Surgeon: Acie Fredrickson Wonda Cheng, MD;  Location: [facility] ENDOSCOPY;  Service: Cardiovascular;  Laterality: N/A;  . TONSILLECTOMY    . TUBAL LIGATION       OB History   No obstetric history on file.     Family History  Problem Relation Age of Onset  . Anesthesia problems Mother   . Diabetes Mother   . Diabetes Father  Social History   Tobacco Use  . Smoking status: Former Smoker    Packs/day: 0.50    Years: 10.00    Pack years: 5.00    Types: Cigarettes    Quit date: 11/12/2003    Years since quitting: 16.9  . Smokeless tobacco: Never Used  Vaping Use  . Vaping Use: Never used  Substance Use Topics  . Alcohol use: Yes    Comment: occasionally  . Drug use: No    Home Medications Prior to Admission medications   Medication Sig Start Date End Date Taking? Authorizing Provider  Calcium Carbonate-Vitamin D (CALCIUM 600 + D PO) Take 1 tablet by mouth daily.    Yes [provider]  celecoxib (CELEBREX) 200 MG capsule Take 200 mg by mouth daily.   Yes [provider]  Cholecalciferol (VITAMIN D3) 25 MCG (1000 UT) CAPS Take by mouth.   Yes [provider]  diltiazem (CARDIZEM CD) 120 MG 24 hr capsule Take 1 capsule (120 mg total) by mouth in the morning and at bedtime. 10/23/20  Yes Turner, Eber Hong, MD  diphenhydramine-acetaminophen (TYLENOL PM) 25-500 MG TABS tablet Take 2 tablets by mouth as needed (sleep).    Yes [provider]  furosemide (LASIX) 40 MG tablet Take 1 tablet (40 mg total)  by mouth daily. 10/23/20  Yes Turner, Traci R, MD  glimepiride (AMARYL) 4 MG tablet Take 2 mg by mouth at bedtime. 09/28/20  Yes [provider]  metFORMIN (GLUCOPHAGE-XR) 500 MG 24 hr tablet Take 500 mg by mouth 2 (two) times daily.    Yes [provider]  rosuvastatin (CRESTOR) 5 MG tablet Take 5 mg by mouth once a week. Pt states she is taking weekly per Dr. Inda Merlin.   Yes [provider]  valsartan (DIOVAN) 160 MG tablet 1 tablet   Yes [provider]  warfarin (COUMADIN) 4 MG tablet TAKE ON MONDAY, WEDNESDAY AND FRIDAY AS DIRECTED BY THE COUMADIN CLINIC 09/28/20  Yes Turner, Traci R, MD  warfarin (COUMADIN) 5 MG tablet TAKE ON SUNDAY, TUESDAY, THURSDAY, AND SATURDAY OR TAKE AS DIRECTED BY THE COUMADIN CLINIC. 09/28/20  Yes Turner, Traci R, MD  diclofenac Sodium (VOLTAREN) 1 % GEL Apply 4 g topically daily. Patient not taking: Reported on 10/23/2020    [provider]  [facility] ULTRA test strip 1 each by Other route daily. 04/22/19   [provider]  [place] 30 MG/2ML SOSY 1 syringe intraarticularly once a week Patient not taking: Reported on 10/23/2020 07/11/20   [provider]    Allergies    Metoprolol, Gemfibrozil, Other, Tamoxifen, Tetracyclines & related, Toprol xl [metoprolol tartrate], Verapamil, Vicodin [hydrocodone-acetaminophen], and Aromasin [exemestane]  Review of Systems   Review of Systems  Constitutional: Negative for chills and fever.  HENT: Negative for congestion and rhinorrhea.   Eyes: Negative for redness and visual disturbance.  Respiratory: Positive for shortness of breath. Negative for wheezing.   Cardiovascular: Positive for chest pain. Negative for palpitations.  Gastrointestinal: Negative for abdominal pain, nausea and vomiting.  Genitourinary: Negative for dysuria and urgency.  Musculoskeletal: Negative for arthralgias and myalgias.  Skin: Negative for pallor and wound.  Neurological: Negative for dizziness  and headaches.    Physical Exam Updated Vital Signs BP (!) 118/59 (BP Location: Right Arm)   Pulse 68   Temp 99.1 F (37.3 C) (Oral)   Resp 14   SpO2 97%   Physical Exam Vitals and nursing note reviewed.  Constitutional:  General: She is not in acute distress.    Appearance: She is well-developed and well-nourished. She is not diaphoretic.     Comments: BMI 41  HENT:     Head: Normocephalic and atraumatic.  Eyes:     Extraocular Movements: EOM normal.     Pupils: Pupils are equal, round, and reactive to light.  Cardiovascular:     Rate and Rhythm: Normal rate and regular rhythm.     Heart sounds: No murmur heard. No friction rub. No gallop.   Pulmonary:     Effort: Pulmonary effort is normal.     Breath sounds: No wheezing, rhonchi or rales.  Chest:     Chest wall: No tenderness.  Abdominal:     General: There is no distension.     Palpations: Abdomen is soft.     Tenderness: There is no abdominal tenderness.  Musculoskeletal:        General: No tenderness or edema.     Cervical back: Normal range of motion and neck supple.  Skin:    General: Skin is warm and dry.  Neurological:     Mental Status: She is alert and oriented to person, place, and time.  Psychiatric:        Mood and Affect: Mood and affect normal.        Behavior: Behavior normal.     ED Results / Procedures / Treatments   Labs (all labs ordered are listed, but only abnormal results are displayed) Labs Reviewed  BASIC METABOLIC PANEL - Abnormal; Notable for the following components:      Result Value   Glucose, Bld 141 (*)    BUN 29 (*)    Creatinine, Ser 1.49 (*)    GFR, Estimated 36 (*)    All other components within normal limits  CBC - Abnormal; Notable for the following components:   RBC 5.84 (*)    Hemoglobin 11.1 (*)    MCV 63.2 (*)    MCH 19.0 (*)    RDW 18.4 (*)    All other components within normal limits  PROTIME-INR - Abnormal; Notable for the following components:    Prothrombin Time 27.0 (*)    INR 2.6 (*)    All other components within normal limits  TSH - Abnormal; Notable for the following components:   TSH 4.853 (*)    All other components within normal limits  BRAIN NATRIURETIC PEPTIDE - Abnormal; Notable for the following components:   B Natriuretic Peptide 192.1 (*)    All other components within normal limits  RESP PANEL BY RT-PCR (FLU A&B, COVID) ARPGX2  COMPREHENSIVE METABOLIC PANEL  LIPID PANEL  PROTIME-INR  TROPONIN I (HIGH SENSITIVITY)  TROPONIN I (HIGH SENSITIVITY)  TROPONIN I (HIGH SENSITIVITY)  TROPONIN I (HIGH SENSITIVITY)    EKG EKG Interpretation  Date/Time:  Tuesday October 23 2020 16:56:43 EST Ventricular Rate:  85 PR Interval:  146 QRS Duration: 86 QT Interval:  376 QTC Calculation: 447 R Axis:   74 Text Interpretation: Sinus rhythm with Blocked Premature atrial complexes with Premature supraventricular complexes and with frequent Premature ventricular complexes Nonspecific ST and T wave abnormality Abnormal ECG downsloping st segments in the inferior and lateral leads  seen on prior though slightly more pronounced. Otherwise no significant change Confirmed by Deno Etienne [phone]) on 10/23/2020 6:13:45 PM   Radiology DG Chest 2 View  Result Date: 10/23/2020 CLINICAL DATA:  Chest pain EXAM: CHEST - 2 VIEW COMPARISON:  10/12/2020, 07/21/2018 FINDINGS: No focal  opacity or pleural effusion. Mild diffuse interstitial opacity some of which is felt chronic. Normal cardiomediastinal silhouette. No pneumothorax. IMPRESSION: No active cardiopulmonary disease. Electronically Signed   By: Donavan Foil M.D.   On: 10/23/2020 17:39    Procedures Procedures   Medications Ordered in ED Medications  nitroGLYCERIN (NITROSTAT) SL tablet 0.4 mg (0.4 mg Sublingual Given 10/23/20 1834)  acetaminophen (TYLENOL) tablet 650 mg (has no administration in time range)  ondansetron (ZOFRAN) injection 4 mg (has no administration in time range)   zolpidem (AMBIEN) tablet 5 mg (has no administration in time range)  sodium chloride flush (NS) 0.9 % injection 3 mL (3 mLs Intravenous Given 10/23/20 2247)  sodium chloride flush (NS) 0.9 % injection 3 mL (has no administration in time range)  0.9 %  sodium chloride infusion (has no administration in time range)  ALPRAZolam (XANAX) tablet 0.25 mg (has no administration in time range)  celecoxib (CELEBREX) capsule 200 mg (has no administration in time range)  diltiazem (CARDIZEM CD) 24 hr capsule 120 mg (120 mg Oral Given 10/23/20 2308)  furosemide (LASIX) tablet 40 mg (40 mg Oral Given 10/23/20 2308)  rosuvastatin (CRESTOR) tablet 5 mg (has no administration in time range)  calcium-vitamin D (OSCAL WITH D) 500-200 MG-UNIT per tablet 1 tablet (has no administration in time range)  diphenhydrAMINE (BENADRYL) capsule 25 mg (has no administration in time range)  aspirin chewable tablet 324 mg (324 mg Oral Given 10/23/20 1814)    ED Course  I have reviewed the triage vital signs and the nursing notes.  Pertinent labs & imaging results that were available during my care of the patient were reviewed by me and considered in my medical decision making (see chart for details).    MDM Rules/Calculators/A&P                          75 yo F with a chief complaints of chest pain.  This is been off and on for the past few weeks.  Is been seen in the ED for this and says an outpatient with her cardiologist.  She feels like the pain has been consistent with seems to build up over time.  Had some transient improvement when she was started on Lasix but feels like it is gotten worse since yesterday.  She saw her cardiologist in the office and she had a positive troponin and was sent here for evaluation.  She had had a cardiac catheterization that appears to be scheduled in 3 days time.  We will repeat trop here.  Repeat EKG.  Patient with ongoing pain will give nitro and aspirin.  Will discuss with  cardiology.   Cards to admit.   CRITICAL CARE Performed by: Cecilio Asper   Total critical care time: 35 minutes  Critical care time was exclusive of separately billable procedures and treating other patients.  Critical care was necessary to treat or prevent imminent or life-threatening deterioration.  Critical care was time spent personally by me on the following activities: development of treatment plan with patient and/or surrogate as well as nursing, discussions with consultants, evaluation of patient's response to treatment, examination of patient, obtaining history from patient or surrogate, ordering and performing treatments and interventions, ordering and review of laboratory studies, ordering and review of radiographic studies, pulse oximetry and re-evaluation of patient's condition.  The patients results and plan were reviewed and discussed.   Any x-rays performed were independently reviewed by myself.  Differential diagnosis were considered with the presenting HPI.  Medications  nitroGLYCERIN (NITROSTAT) SL tablet 0.4 mg (0.4 mg Sublingual Given 10/23/20 1834)  acetaminophen (TYLENOL) tablet 650 mg (has no administration in time range)  ondansetron (ZOFRAN) injection 4 mg (has no administration in time range)  zolpidem (AMBIEN) tablet 5 mg (has no administration in time range)  sodium chloride flush (NS) 0.9 % injection 3 mL (3 mLs Intravenous Given 10/23/20 2247)  sodium chloride flush (NS) 0.9 % injection 3 mL (has no administration in time range)  0.9 %  sodium chloride infusion (has no administration in time range)  ALPRAZolam (XANAX) tablet 0.25 mg (has no administration in time range)  celecoxib (CELEBREX) capsule 200 mg (has no administration in time range)  diltiazem (CARDIZEM CD) 24 hr capsule 120 mg (120 mg Oral Given 10/23/20 2308)  furosemide (LASIX) tablet 40 mg (40 mg Oral Given 10/23/20 2308)  rosuvastatin (CRESTOR) tablet 5 mg (has no administration in  time range)  calcium-vitamin D (OSCAL WITH D) 500-200 MG-UNIT per tablet 1 tablet (has no administration in time range)  diphenhydrAMINE (BENADRYL) capsule 25 mg (has no administration in time range)  aspirin chewable tablet 324 mg (324 mg Oral Given 10/23/20 1814)    Vitals:   10/23/20 1830 10/23/20 1839 10/23/20 1845 10/23/20 2040  BP: (!) 142/43 (!) 116/53 (!) 118/59 (!) 118/59  Pulse: 79 75 75 68  Resp: (!) 22 (!) 24 16 14   Temp:    99.1 F (37.3 C)  TempSrc:    Oral  SpO2: 95% 97% 96% 97%    Final diagnoses:  Unstable angina (HCC)    Admission/ observation were discussed with the admitting physician, patient and/or family and they are comfortable with the plan.    Final Clinical Impression(s) / ED Diagnoses Final diagnoses:  Unstable angina [facility])    Rx / DC Orders ED Discharge Orders    None       Deno Etienne, DO 10/23/20 2319

## 2020-10-24 ENCOUNTER — Other Ambulatory Visit (HOSPITAL_COMMUNITY): Payer: Medicare PPO

## 2020-10-24 ENCOUNTER — Encounter (HOSPITAL_COMMUNITY): Payer: Self-pay | Admitting: Cardiology

## 2020-10-24 DIAGNOSIS — I480 Paroxysmal atrial fibrillation: Secondary | ICD-10-CM

## 2020-10-24 DIAGNOSIS — E119 Type 2 diabetes mellitus without complications: Secondary | ICD-10-CM

## 2020-10-24 DIAGNOSIS — R0600 Dyspnea, unspecified: Secondary | ICD-10-CM

## 2020-10-24 DIAGNOSIS — R079 Chest pain, unspecified: Secondary | ICD-10-CM

## 2020-10-24 LAB — BASIC METABOLIC PANEL
BUN/Creatinine Ratio: 25 (ref 12–28)
BUN: 30 mg/dL — ABNORMAL HIGH (ref 8–27)
CO2: 22 mmol/L (ref 20–29)
Calcium: 9.9 mg/dL (ref 8.7–10.3)
Chloride: 105 mmol/L (ref 96–106)
Creatinine, Ser: 1.2 mg/dL — ABNORMAL HIGH (ref 0.57–1.00)
Glucose: 122 mg/dL — ABNORMAL HIGH (ref 65–99)
Potassium: 5 mmol/L (ref 3.5–5.2)
Sodium: 141 mmol/L (ref 134–144)
eGFR: 47 mL/min/{1.73_m2} — ABNORMAL LOW (ref >59)

## 2020-10-24 LAB — COMPREHENSIVE METABOLIC PANEL
ALT: 18 U/L (ref 0–44)
AST: 17 U/L (ref 15–41)
Albumin: 3.9 g/dL (ref 3.5–5.0)
Alkaline Phosphatase: 84 U/L (ref 38–126)
Anion gap: 11 (ref 5–15)
BUN: 32 mg/dL — ABNORMAL HIGH (ref 8–23)
CO2: 21 mmol/L — ABNORMAL LOW (ref 22–32)
Calcium: 9.5 mg/dL (ref 8.9–10.3)
Chloride: 107 mmol/L (ref 98–111)
Creatinine, Ser: 1.46 mg/dL — ABNORMAL HIGH (ref 0.44–1.00)
GFR, Estimated: 37 mL/min — ABNORMAL LOW (ref >60)
Glucose, Bld: 134 mg/dL — ABNORMAL HIGH (ref 70–99)
Potassium: 4.7 mmol/L (ref 3.5–5.1)
Sodium: 139 mmol/L (ref 135–145)
Total Bilirubin: 1.1 mg/dL (ref 0.3–1.2)
Total Protein: 6.7 g/dL (ref 6.5–8.1)

## 2020-10-24 LAB — PROTIME-INR
INR: 2.4 — ABNORMAL HIGH (ref 0.8–1.2)
Prothrombin Time: 25.6 seconds — ABNORMAL HIGH (ref 11.4–15.2)

## 2020-10-24 LAB — CBC
Hematocrit: 40.3 % (ref 34.0–46.6)
Hemoglobin: 11.7 g/dL (ref 11.1–15.9)
MCH: 18.5 pg — ABNORMAL LOW (ref 26.6–33.0)
MCHC: 29 g/dL — ABNORMAL LOW (ref 31.5–35.7)
MCV: 64 fL — ABNORMAL LOW (ref 79–97)
Platelets: 229 10*3/uL (ref 150–450)
RBC: 6.31 x10E6/uL — ABNORMAL HIGH (ref 3.77–5.28)
RDW: 18.5 % — ABNORMAL HIGH (ref 11.7–15.4)
WBC: 8.7 10*3/uL (ref 3.4–10.8)

## 2020-10-24 LAB — LIPID PANEL
Cholesterol: 151 mg/dL (ref 0–200)
HDL: 36 mg/dL — ABNORMAL LOW (ref >40)
LDL Cholesterol: 69 mg/dL (ref 0–99)
Total CHOL/HDL Ratio: 4.2 RATIO
Triglycerides: 232 mg/dL — ABNORMAL HIGH (ref <150)
VLDL: 46 mg/dL — ABNORMAL HIGH (ref 0–40)

## 2020-10-24 LAB — GLUCOSE, CAPILLARY
Glucose-Capillary: 156 mg/dL — ABNORMAL HIGH (ref 70–99)
Glucose-Capillary: 126 mg/dL — ABNORMAL HIGH (ref 70–99)
Glucose-Capillary: 133 mg/dL — ABNORMAL HIGH (ref 70–99)
Glucose-Capillary: 188 mg/dL — ABNORMAL HIGH (ref 70–99)

## 2020-10-24 LAB — HEMOGLOBIN A1C
Hgb A1c MFr Bld: 7.6 % — ABNORMAL HIGH (ref 4.8–5.6)
Mean Plasma Glucose: 171.42 mg/dL

## 2020-10-24 LAB — TROPONIN I (HIGH SENSITIVITY): Troponin I (High Sensitivity): 13 ng/L (ref <18)

## 2020-10-24 LAB — PRO B NATRIURETIC PEPTIDE: NT-Pro BNP: 558 pg/mL (ref 0–738)

## 2020-10-24 LAB — TSH: TSH: 2.67 u[IU]/mL (ref 0.450–4.500)

## 2020-10-24 MED ORDER — ASPIRIN 81 MG PO CHEW
81.0000 mg | CHEWABLE_TABLET | Freq: Every day | ORAL | Status: DC
  Administered 2020-10-24 – 2020-10-25 (×2): 81 mg via ORAL
  Filled 2020-10-24 (×2): qty 1

## 2020-10-24 MED ORDER — INSULIN ASPART 100 UNIT/ML ~~LOC~~ SOLN
0.0000 [IU] | Freq: Three times a day (TID) | SUBCUTANEOUS | Status: DC
  Administered 2020-10-25: 12:00:00 5 [IU] via SUBCUTANEOUS

## 2020-10-24 MED ORDER — INSULIN ASPART 100 UNIT/ML ~~LOC~~ SOLN: 0.0000 [IU] | Freq: Every day | SUBCUTANEOUS | Status: DC

## 2020-10-24 NOTE — ED Notes (Signed)
Tele  Breakfast Ordered 

## 2020-10-24 NOTE — H&P (Signed)
Cardiology History & Physical    Patient ID: Jenna Mcdonald MRN: [id number], DOB/AGE: [birth year]   Admit date: 10/23/2020  Primary Physician: Josetta Huddle, MD Primary Cardiologist: Fransico Him, MD  Patient Profile    75 year old female with OSA, HTN, PAF s/p AF ablation 2020, mild AS and morbid obesity sent to the ED for shortness of breath and chest pain after positive troponin as an outpatient.  History of Present Illness    75 year old female with past medical history as above who reports symptoms of shortness of breath and chest pain.  She was seen in early February at which point she had lexiscan without ischemia and echo with diastolic dysfunction and mild AS.  She had subseqeunt presentation with similar complaints of chest pain and DOE with minimal exertion on 10/12/20 and was sent home with lasix.  She says during her brief course of diuretics.  She also endorses some orthopnea an dPND though she was lying flat comfortably on my evaluation.  Review of symptoms was otherwise normal.  She is asymptomatic from palpitations though she had recent ziopatch with frequent PVCs.  Confirms complete medication adherence.  She had been arranged for right and left heart catheterization later this week but, in the setting of mild troponin elevation was told to present to ED.   Past Medical History   Past Medical History:  Diagnosis Date  . Anemia   . Aortic stenosis    mild by echo 09/2020 with mean AVG 54mmHg  . Breast CA ([place])    left  . Breast cancer ([place])   . Carotid stenosis    1-39% left  . Degenerative disc disease, lumbar    of the knees  . Diabetes mellitus   . 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[place])   . Fatigue    Severe secondary to to gemfibrozil  . Gastroesophageal reflux disease    denies  . GI bleeding    a. 06/2018: EGD showed multiple nonbleeding duodenal ulcers. 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   . Hammertoe    left second toe  . Hypercholesteremia   . Hypertension   . Insomnia   . Irritable bowel   . Low back pain   . Metabolic syndrome   . Morbid obesity ([place])   . OSA on CPAP \   bipap  . Osteoarthritis   . Osteopenia   . Persistent atrial fibrillation [facility])    s/p afib ablation - Dr. Rayann Heman 08/2018  . Personal history of radiation therapy   . Pneumonia   . PONV (postoperative nausea and vomiting)   . Sigmoid diverticulosis   . Thalassemia minor   . Vitamin D deficiency     Past Surgical History:  Procedure Laterality Date  . ATRIAL FIBRILLATION ABLATION N/A 09/21/2018   Procedure: ATRIAL FIBRILLATION ABLATION;  Surgeon: Thompson Grayer, MD;  Location: [place] [facility];  Service: Cardiovascular;  Laterality: N/A;  . BIOPSY  07/06/2018   Procedure: BIOPSY;  Surgeon: Clarene Essex, MD;  Location: [place];  Service: Endoscopy;;  . BREAST LUMPECTOMY Left 05/28/2009  . BREAST SURGERY Left 10   lumpectomy  . CARDIOVERSION N/A 07/07/2018   Procedure: CARDIOVERSION;  Surgeon: [name], MD;  Location: [facility] ENDOSCOPY;  Service: Cardiovascular;  Laterality: N/A;  . CARDIOVERSION N/A 07/14/2018   Procedure: CARDIOVERSION;  Surgeon: Larey Dresser, MD;  Location: [facility] ENDOSCOPY;  Service: Cardiovascular;  Laterality: N/A;  . CESAREAN SECTION    .  COLONOSCOPY WITH PROPOFOL N/A 07/06/2018   Procedure: COLONOSCOPY WITH PROPOFOL;  Surgeon: Clarene Essex, MD;  Location: [place];  Service: Endoscopy;  Laterality: N/A;  . CYSTECTOMY  70   pilonidal   . DILATION AND CURETTAGE OF UTERUS    . ESOPHAGOGASTRODUODENOSCOPY (EGD) WITH PROPOFOL N/A 07/06/2018   Procedure: ESOPHAGOGASTRODUODENOSCOPY (EGD) WITH PROPOFOL;  Surgeon: Clarene Essex, MD;  Location: [place];  Service: Endoscopy;  Laterality: N/A;  . EYE SURGERY Bilateral 12/14   cataracts  . HOT HEMOSTASIS N/A 07/06/2018   Procedure: HOT HEMOSTASIS (ARGON PLASMA COAGULATION/BICAP);  Surgeon: Clarene Essex, MD;   Location: [place];  Service: Endoscopy;  Laterality: N/A;  . MAXIMUM ACCESS (MAS)POSTERIOR LUMBAR INTERBODY FUSION (PLIF) 2 LEVEL N/A 09/22/2013   Procedure: FOR MAXIMUM ACCESS  POSTERIOR LUMBAR INTERBODY FUSION LUMBAR THREE-FOUR FOUR-FIVE;  Surgeon: Eustace Moore, MD;  Location: [place] [facility];  Service: Neurosurgery;  Laterality: N/A;  . OOPHORECTOMY     wedge section not rem  . 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 REPLACEMENT TOTAL KNEE    . TEE WITHOUT CARDIOVERSION N/A 07/07/2018   Procedure: TRANSESOPHAGEAL ECHOCARDIOGRAM (TEE);  Surgeon: [name], MD;  Location: [place];  Service: Cardiovascular;  Laterality: N/A;  . TEE WITHOUT CARDIOVERSION N/A 09/21/2018   Procedure: TRANSESOPHAGEAL ECHOCARDIOGRAM (TEE);  Surgeon: Acie Fredrickson Wonda Cheng, MD;  Location: [facility] ENDOSCOPY;  Service: Cardiovascular;  Laterality: N/A;  . TONSILLECTOMY    . TUBAL LIGATION       Allergies Allergies  Allergen Reactions  . Metoprolol Shortness Of Breath, Diarrhea and Other (See Comments)    Fatigue   . Gemfibrozil Other (See Comments)    Severe fatigue  . Other Other (See Comments)    Numerous cancer drugs - unknown reaction  . Tamoxifen Other (See Comments)    Bone and body aches   . Tetracyclines & Related Other (See Comments)    Vaginal infections    . Toprol Xl [Metoprolol Tartrate] Other (See Comments)    Hair loss  . Verapamil Other (See Comments)    Unknown  . Vicodin [Hydrocodone-Acetaminophen] Nausea And Vomiting and Other (See Comments)    Patient reports terrible nausea; plain tylenol is ok with patient  . Aromasin [Exemestane] Other (See Comments)    Hair loss     Home Medications    Prior to Admission medications   Medication Sig Start Date End Date Taking? Authorizing Provider  Calcium Carbonate-Vitamin D (CALCIUM 600 + D PO) Take 1 tablet by mouth daily.    Yes [provider]  celecoxib (CELEBREX) 200 MG capsule Take 200 mg by mouth daily.   Yes [provider]  Cholecalciferol (VITAMIN D3) 25 MCG (1000 UT) CAPS Take by mouth.   Yes [provider]  diltiazem (CARDIZEM CD) 120 MG 24 hr capsule Take 1 capsule (120 mg total) by mouth in the morning and at bedtime. 10/23/20  Yes Turner, Eber Hong, MD  diphenhydramine-acetaminophen (TYLENOL PM) 25-500 MG TABS tablet Take 2 tablets by mouth as needed (sleep).    Yes [provider]  furosemide (LASIX) 40 MG tablet Take 1 tablet (40 mg total) by mouth daily. 10/23/20  Yes Turner, Traci R, MD  glimepiride (AMARYL) 4 MG tablet Take 2 mg by mouth at bedtime. 09/28/20  Yes [provider]  metFORMIN (GLUCOPHAGE-XR) 500 MG 24 hr tablet Take 500 mg by mouth 2 (two) times daily.    Yes [provider]  rosuvastatin (CRESTOR) 5 MG tablet Take 5 mg  by mouth once a week. Pt states she is taking weekly per Dr. Inda Merlin.   Yes [provider]  valsartan (DIOVAN) 160 MG tablet 1 tablet   Yes [provider]  warfarin (COUMADIN) 4 MG tablet TAKE ON MONDAY, WEDNESDAY AND FRIDAY AS DIRECTED BY THE COUMADIN CLINIC 09/28/20  Yes Turner, Traci R, MD  warfarin (COUMADIN) 5 MG tablet TAKE ON SUNDAY, TUESDAY, THURSDAY, AND SATURDAY OR TAKE AS DIRECTED BY THE COUMADIN CLINIC. 09/28/20  Yes Turner, Traci R, MD  diclofenac Sodium (VOLTAREN) 1 % GEL Apply 4 g topically daily. Patient not taking: Reported on 10/23/2020    [provider]  [facility] ULTRA test strip 1 each by Other route daily. 04/22/19   [provider]  [place] 30 MG/2ML SOSY 1 syringe intraarticularly once a week Patient not taking: Reported on 10/23/2020 07/11/20   [provider]    Family History    Family History  Problem Relation Age of Onset  . Anesthesia problems Mother   . Diabetes Mother   . Diabetes Father    She indicated that her mother is deceased. She indicated that her father is deceased. She indicated that her maternal grandmother is deceased. She indicated that her  maternal grandfather is deceased. She indicated that her paternal grandmother is deceased. She indicated that her paternal grandfather is deceased.   Social History    Social History   Socioeconomic History  . Marital status: Single    Spouse name: Not on file  . Number of children: Not on file  . Years of education: Not on file  . Highest education level: Not on file  Occupational History  . Not on file  Tobacco Use  . Smoking status: Former Smoker    Packs/day: 0.50    Years: 10.00    Pack years: 5.00    Types: Cigarettes    Quit date: 11/12/2003    Years since quitting: 16.9  . Smokeless tobacco: Never Used  Vaping Use  . Vaping Use: Never used  Substance and Sexual Activity  . Alcohol use: Yes    Comment: occasionally  . Drug use: No  . Sexual activity: Never    Birth control/protection: Post-menopausal  Other Topics Concern  . Not on file  Social History Narrative  . Not on file   Social Determinants of Health   Financial Resource Strain: Not on file  Food Insecurity: Not on file  Transportation Needs: Not on file  Physical Activity: Not on file  Stress: Not on file  Social Connections: Not on file  Intimate Partner Violence: Not on file     Review of Systems    General:  No chills, fever, night sweats or weight changes.  Cardiovascular:  No chest pain, dyspnea on exertion, edema, orthopnea, palpitations, paroxysmal nocturnal dyspnea. Dermatological: No rash, lesions/masses Respiratory: No cough, dyspnea Urologic: No hematuria, dysuria Abdominal:   No nausea, vomiting, diarrhea, bright red blood per rectum, melena, or hematemesis Neurologic:  No visual changes, wkns, changes in mental status. All other systems reviewed and are otherwise negative except as noted above.  Physical Exam    BP (!) 151/69 (BP Location: Right Arm)   Pulse 74   Temp 99.1 F (37.3 C) (Oral)   Resp 14   SpO2 96%  General: Alert, NAD HEENT: Normal  Neck: No bruits or  JVD. Lungs:  Resp regular and unlabored, CTA bilaterally. Heart: Regular rhythm, no s3, s4, or murmurs. Abdomen: Soft, non-tender, non-distended, BS +.  Extremities: Warm. No clubbing, cyanosis or edema. DP/PT/Radials 2+ and equal bilaterally. Psych: Normal affect. Neuro: Alert and oriented. No gross focal deficits. No abnormal movements.  Labs    Troponin (Point of Care Test) No results for input(s): TROPIPOC in the last 72 hours. No results for input(s): CKTOTAL, CKMB, TROPONINI in the last 72 hours. Lab Results  Component Value Date   WBC 7.8 10/23/2020   HGB 11.1 (L) 10/23/2020   HCT 36.9 10/23/2020   MCV 63.2 (L) 10/23/2020   PLT 178 10/23/2020    Recent Labs  Lab 10/23/20 1714  NA 140  K 4.8  CL 108  CO2 24  BUN 29*  CREATININE 1.49*  CALCIUM 9.6  GLUCOSE 141*   Lab Results  Component Value Date   CHOL  05/14/2008    146        ATP III CLASSIFICATION:  <200     mg/dL   Desirable  200-239  mg/dL   Borderline High  >=240    mg/dL   High   HDL 27 (L) 05/14/2008   LDLCALC  05/14/2008    98        Total Cholesterol/HDL:CHD Risk Coronary Heart Disease Risk Table                     Men   Women  1/2 Average Risk   3.4   3.3   TRIG 105 05/14/2008   No results found for: [facility]   Radiology Studies    DG Chest 2 View  Result Date: 10/23/2020 CLINICAL DATA:  Chest pain EXAM: CHEST - 2 VIEW COMPARISON:  10/12/2020, 07/21/2018 FINDINGS: No focal opacity or pleural effusion. Mild diffuse interstitial opacity some of which is felt chronic. Normal cardiomediastinal silhouette. No pneumothorax. IMPRESSION: No active cardiopulmonary disease. Electronically Signed   By: Donavan Foil M.D.   On: 10/23/2020 17:39   DG Chest 2 View  Result Date: 10/12/2020 CLINICAL DATA:  Short of breath, chest heaviness for 1 month EXAM: CHEST - 2 VIEW COMPARISON:  07/21/2018 FINDINGS: Frontal and lateral views of the chest demonstrate a stable cardiac silhouette. Mild increased central  vascular congestion with diffuse interstitial prominence, favor mild volume overload. No acute airspace disease, effusion, or pneumothorax. No acute bony abnormalities. IMPRESSION: 1. Findings consistent with mild interstitial edema. Electronically Signed   By: Randa Ngo M.D.   On: 10/12/2020 16:33   MYOCARDIAL PERFUSION IMAGING  Result Date: 10/04/2020  The left ventricular ejection fraction is moderately decreased (30-44%).  Nuclear stress EF: 38%.  There was no ST segment deviation noted during stress.  The study is normal.  This is a low risk study.  Normal pharmacologic nuclear stress test with no evidence for prior infarct or ischemia. Very frequent PACs and short runs of atrial tachycardia. LVEF appears underestimated, correlation with an echocardiogram is recommended.  ECHOCARDIOGRAM COMPLETE  Result Date: 10/09/2020    ECHOCARDIOGRAM REPORT   Patient Name:   Jenna Mcdonald Date of Exam: 10/09/2020 Medical Rec #:  [id number]          Height:       65.0 in Accession #:    [id number]         Weight:       255.0 lb Date of Birth:  1945/09/17           BSA:          2.193 m Patient Age:    3 years  BP:           140/54 mmHg Patient Gender: F                  HR:           87 bpm. Exam Location:  [address] Procedure: 2D Echo, Cardiac Doppler and Color Doppler Indications:     I35.0 Aortic Stenosis  History:         Patient has prior history of Echocardiogram examinations, most                  recent 09/21/2018. CAD, Aortic Valve Disease, Arrythmias:Atrial                  Fibrillation, Signs/Symptoms:Shortness of Breath, Dyspnea and                  Chest Pain; Risk Factors:Hypertension, Diabetes, Dyslipidemia,                  Former Smoker and Sleep Apnea. Left Breast Cancer status post                  Radiation and Lumpectomy, Obesity, Mild Aortic Stenosis.  Sonographer:     Deliah Boston RDCS Referring Phys:  [id number] TRACI R TURNER Diagnosing Phys: Mertie Moores MD   Sonographer Comments: Patient arranged to see A-fib clinic tomorrow for new onset A-fib. (spoke with Dr. Radford Pax while patient in office) IMPRESSIONS  1. Left ventricular ejection fraction, by estimation, is 70 to 75%. The left ventricle has hyperdynamic function. The left ventricle has no regional wall motion abnormalities. There is mild left ventricular hypertrophy. Left ventricular diastolic parameters are consistent with Grade II diastolic dysfunction (pseudonormalization).  2. Right ventricular systolic function is normal. The right ventricular size is normal. There is normal pulmonary artery systolic pressure.  3. Left atrial size was moderately dilated.  4. The mitral valve is normal in structure. Trivial mitral valve regurgitation. No evidence of mitral stenosis.  5. The aortic valve is grossly normal. Aortic valve regurgitation is not visualized. Mild aortic valve stenosis. FINDINGS  Left Ventricle: Left ventricular ejection fraction, by estimation, is 70 to 75%. The left ventricle has hyperdynamic function. The left ventricle has no regional wall motion abnormalities. The left ventricular internal cavity size was normal in size. There is mild left ventricular hypertrophy. Left ventricular diastolic parameters are consistent with Grade II diastolic dysfunction (pseudonormalization). Right Ventricle: The right ventricular size is normal. No increase in right ventricular wall thickness. Right ventricular systolic function is normal. There is normal pulmonary artery systolic pressure. The tricuspid regurgitant velocity is 2.84 m/s, and  with an assumed right atrial pressure of 3 mmHg, the estimated right ventricular systolic pressure is 19.1 mmHg. Left Atrium: Left atrial size was moderately dilated. Right Atrium: Right atrial size was normal in size. Pericardium: There is no evidence of pericardial effusion. Mitral Valve: The mitral valve is normal in structure. Trivial mitral valve regurgitation. No evidence  of mitral valve stenosis. Tricuspid Valve: The tricuspid valve is grossly normal. Tricuspid valve regurgitation is trivial. Aortic Valve: The aortic valve is grossly normal. Aortic valve regurgitation is not visualized. Mild aortic stenosis is present. Aortic valve mean gradient measures 11.3 mmHg. Aortic valve peak gradient measures 19.0 mmHg. Aortic valve area, by VTI measures 3.28 cm. Pulmonic Valve: The pulmonic valve was normal in structure. Pulmonic valve regurgitation is not visualized. Aorta: The aortic root and ascending aorta are structurally normal, with no evidence  of dilitation. IAS/Shunts: The atrial septum is grossly normal.  LEFT VENTRICLE PLAX 2D LVIDd:         4.50 cm  Diastology LVIDs:         1.80 cm  LV e' medial:    9.63 cm/s LV PW:         1.00 cm  LV E/e' medial:  13.5 LV IVS:        1.30 cm  LV e' lateral:   7.83 cm/s LVOT diam:     2.60 cm  LV E/e' lateral: 16.6 LV SV:         131 LV SV Index:   60 LVOT Area:     5.31 cm  RIGHT VENTRICLE RV S prime:     19.70 cm/s TAPSE (M-mode): 3.1 cm LEFT ATRIUM             Index       RIGHT ATRIUM           Index LA diam:        4.20 cm 1.91 cm/m  RA Area:     14.70 cm LA Vol (A2C):   96.1 ml 43.82 ml/m RA Volume:   39.90 ml  18.19 ml/m LA Vol (A4C):   93.8 ml 42.77 ml/m LA Biplane Vol: 94.9 ml 43.27 ml/m  AORTIC VALVE AV Area (Vmax):    2.59 cm AV Area (Vmean):   2.55 cm AV Area (VTI):     3.28 cm AV Vmax:           218.00 cm/s AV Vmean:          145.867 cm/s AV VTI:            0.400 m AV Peak Grad:      19.0 mmHg AV Mean Grad:      11.3 mmHg LVOT Vmax:         106.50 cm/s LVOT Vmean:        70.100 cm/s LVOT VTI:          0.247 m LVOT/AV VTI ratio: 0.62  AORTA Ao Root diam: 3.00 cm Ao Asc diam:  3.30 cm MITRAL VALVE                TRICUSPID VALVE MV Area (PHT): cm          TR Peak grad:   32.3 mmHg MV Decel Time: 201 msec     TR Vmax:        284.00 cm/s MV E velocity: 129.67 cm/s MV A velocity: 89.83 cm/s   SHUNTS MV E/A ratio:  1.44          Systemic VTI:  0.25 m                             Systemic Diam: 2.60 cm Mertie Moores MD Electronically signed by Mertie Moores MD Signature Date/Time: 10/09/2020/1:59:59 PM    Final (Updated)     ECG & Cardiac Imaging    Jenna Mcdonald 10/04/2020 Study Highlights    The left ventricular ejection fraction is moderately decreased (30-44%).  Nuclear stress EF: 38%.  There was no ST segment deviation noted during stress.  The study is normal.  This is a low risk study.  Normal pharmacologic nuclear stress test with no evidence for prior infarct or ischemia. Very frequent PACs and short runs of atrial tachycardia. LVEF appears underestimated, correlation with an echocardiogram is recommended.  2D  echo 10/09/2020 IMPRESSIONS  1. Left ventricular ejection fraction, by estimation, is 70 to 75%. The  left ventricle has hyperdynamic function. The left ventricle has no  regional wall motion abnormalities. There is mild left ventricular  hypertrophy. Left ventricular diastolic  parameters are consistent with Grade II diastolic dysfunction  (pseudonormalization).  2. Right ventricular systolic function is normal. The right ventricular  size is normal. There is normal pulmonary artery systolic pressure.  3. Left atrial size was moderately dilated.  4. The mitral valve is normal in structure. Trivial mitral valve  regurgitation. No evidence of mitral stenosis.  5. The aortic valve is grossly normal. Aortic valve regurgitation is not  visualized. Mild aortic valve stenosis.   EKG NSR with PACs and PVCs stable from prior.  Assessment & Plan    75 year old female with OSA, HTN, PAF s/p AF ablation 2020, mild AS and morbid obesity sent to the ED for shortness of breath and chest pain after positive troponin as an outpatient.  She has had ongoing similar complaints for the past two months.  The only time symptoms have improved was during short course of diuretics.  Will admit  for observation with positive troponin though I don't think symptoms are consistent with unstable angina.  Suspect underlying HFpEF though she certainly has risk factors for underlying CAD.  Problem list Dyspnea Chest pain Paroxysmal AF Mild aortic stenosis CKD3a T2DM Morbid obesity OSA on CPAP   Plan Dyspnea Chest pain - Lasix 40 mg IV BID - Monitor electrolytes on IV diuretics, telemetry - Plan L/RHC on Thursday (currently on warfarin) - Moderate intensity statin - Aspirin; can discontinue if no significant CAD on warfarin  Paroxysmal AF - Hold warfarin in anticipation of cardiac catheterization - Continue diltiazem 120 mg IBD  T2DM - SSI - Good candidate for SGLT2i at discharge  OSA on CPAP - Continue CPAP with sleep  Mild aortic stenosis CKD3a Morbid obesity  Nutrition: Heart healthy DVT ppx: warfarin GI ppx: None Advanced Care Planning: Full code  Signed, Delight Hoh, MD 10/24/2020, 5:05 AM

## 2020-10-24 NOTE — Progress Notes (Signed)
[place] for heparin Indication: chest pain/ACS  Allergies  Allergen Reactions  . Metoprolol Shortness Of Breath, Diarrhea and Other (See Comments)    Fatigue   . Gemfibrozil Other (See Comments)    Severe fatigue  . Other Other (See Comments)    Numerous cancer drugs - unknown reaction  . Tamoxifen Other (See Comments)    Bone and body aches   . Tetracyclines & Related Other (See Comments)    Vaginal infections    . Toprol Xl [Metoprolol Tartrate] Other (See Comments)    Hair loss  . Verapamil Other (See Comments)    Unknown  . Vicodin [Hydrocodone-Acetaminophen] Nausea And Vomiting and Other (See Comments)    Patient reports terrible nausea; plain tylenol is ok with patient  . Aromasin [Exemestane] Other (See Comments)    Hair loss     Patient Measurements: Height: 5\' 5"  (165.1 cm) Weight: 111.2 kg (245 lb 3.2 oz) IBW/kg (Calculated) : 57 Heparin Dosing Weight: 83 kg   Vital Signs: Temp: 98.3 F (36.8 C) (03/02 0800) Temp Source: Oral (03/02 0800) BP: 157/74 (03/02 0800) Pulse Rate: 73 (03/02 0800)  Labs: Recent Labs    10/23/20 1133 10/23/20 1329 10/23/20 1714 10/23/20 1714 10/23/20 1914 10/23/20 2135 10/24/20 0344  HGB  --  11.7 11.1*  --   --   --   --   HCT  --  40.3 36.9  --   --   --   --   PLT  --  229 178  --   --   --   --   LABPROT  --   --   --   --   --  27.0* 25.6*  INR 3.4*  --   --   --   --  2.6* 2.4*  CREATININE  --  1.20* 1.49*  --   --   --  1.46*  TROPONINIHS  --   --  12   < > 13 13 13    < > = values in this interval not displayed.    Estimated Creatinine Clearance: 41.4 mL/min (A) (by C-G formula based on SCr of 1.46 mg/dL (H)).   Medical History: Past Medical History:  Diagnosis Date  . Anemia   . Aortic stenosis    mild by echo 09/2020 with mean AVG 49mmHg  . Breast CA ([place])    left  . Breast cancer ([place])   . Carotid stenosis    1-39% left  . Degenerative disc disease, lumbar     of the knees  . Diabetes mellitus   . 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[place])   . Fatigue    Severe secondary to to gemfibrozil  . Gastroesophageal reflux disease    denies  . GI bleeding    a. 06/2018: EGD showed multiple nonbleeding duodenal ulcers. 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  . Hammertoe    left second toe  . Hypercholesteremia   . Hypertension   . Insomnia   . Irritable bowel   . Low back pain   . Metabolic syndrome   . Morbid obesity ([place])   . OSA on CPAP \   bipap  . Osteoarthritis   . Osteopenia   . Persistent atrial fibrillation [facility])    s/p afib ablation - Dr. Rayann Heman 08/2018  . Personal history of radiation therapy   . Pneumonia   . PONV (postoperative nausea and vomiting)   .  Sigmoid diverticulosis   . Thalassemia minor   . Vitamin D deficiency     Medications:  Medications Prior to Admission  Medication Sig Dispense Refill Last Dose  . Calcium Carbonate-Vitamin D (CALCIUM 600 + D PO) Take 1 tablet by mouth daily.    10/22/2020 at Unknown time  . celecoxib (CELEBREX) 200 MG capsule Take 200 mg by mouth daily.   10/22/2020 at Unknown time  . Cholecalciferol (VITAMIN D3) 25 MCG (1000 UT) CAPS Take by mouth.   10/22/2020 at Unknown time  . diltiazem (CARDIZEM CD) 120 MG 24 hr capsule Take 1 capsule (120 mg total) by mouth in the morning and at bedtime. 180 capsule 3 10/22/2020 at Unknown time  . diphenhydramine-acetaminophen (TYLENOL PM) 25-500 MG TABS tablet Take 2 tablets by mouth as needed (sleep).    Past Week at Unknown time  . furosemide (LASIX) 40 MG tablet Take 1 tablet (40 mg total) by mouth daily. 90 tablet 3 Past Week at Unknown time  . glimepiride (AMARYL) 4 MG tablet Take 2 mg by mouth at bedtime.   10/22/2020 at Unknown time  . metFORMIN (GLUCOPHAGE-XR) 500 MG 24 hr tablet Take 500 mg by mouth 2 (two) times daily.   3 10/23/2020 at 0800  . rosuvastatin (CRESTOR) 5 MG tablet Take 5 mg by mouth  once a week. Pt states she is taking weekly per Dr. Inda Merlin.   Past Week at Unknown time  . valsartan (DIOVAN) 160 MG tablet 1 tablet     . warfarin (COUMADIN) 4 MG tablet TAKE ON MONDAY, WEDNESDAY AND FRIDAY AS DIRECTED BY THE COUMADIN CLINIC 45 tablet 1 10/22/2020 at Unknown time  . warfarin (COUMADIN) 5 MG tablet TAKE ON SUNDAY, TUESDAY, THURSDAY, AND SATURDAY OR TAKE AS DIRECTED BY THE COUMADIN CLINIC. 60 tablet 1 Past Week at Unknown time  . diclofenac Sodium (VOLTAREN) 1 % GEL Apply 4 g topically daily. (Patient not taking: Reported on 10/23/2020)   Not Taking at Unknown time  . ONETOUCH ULTRA test strip 1 each by Other route daily.     . ORTHOVISC 30 MG/2ML SOSY 1 syringe intraarticularly once a week (Patient not taking: Reported on 10/23/2020)   Completed Course at Unknown time    Assessment: 89 YOF complaining of chest pressure on warfarin at home. Pharmacy consulted to start IV heparin for ACS when INR drifts to less than 2. INR on admission is 2.6  INR down slightly to 2.4. No plans for reversal today. Planning cath in am if INR is down.   Goal of Therapy:  Heparin level 0.3-0.7 units/ml Monitor platelets by anticoagulation protocol: Yes   Plan:  -Recheck INR in AM. Will plan to start IV heparin when INR is less than 2   Erin Hearing PharmD., BCPS Clinical Pharmacist 10/24/2020 10:57 AM

## 2020-10-24 NOTE — Progress Notes (Signed)
  Based on persistent dyspnea and chest pain along with positive troponin, patient was admitted to INPATIENT status with plans for right and left heart cath. Her coumadin was held and she requires bridging with IV heparin drip. She meets inpatient criteria.    Ledora Bottcher, PA-C 10/24/2020, 11:33 AM [place] [address] [place], [place] [address]

## 2020-10-24 NOTE — Progress Notes (Signed)
Patient admitted earlier this morning. She is a patient of Dr. Theodosia Blender who had a recent negative nuclear study but has persistent dyspnea and chest pain. Dr. Radford Pax has arranged right and left catheterization. Troponin T was minimally elevated and she was admitted. She denies chest pain or dyspnea this morning. Her BUN and creatinine are mildly increased with Lasix. We will hold prior to catheterization. INR is 2.4. Hold Coumadin and begin heparin when INR less than 2. Proceed with catheterization tomorrow morning pending follow-up INR and bmet.  Kirk Ruths, MD

## 2020-10-25 ENCOUNTER — Encounter (HOSPITAL_COMMUNITY)
Admission: EM | Disposition: A | Discharge status: Home / Self Care | Payer: Self-pay | Source: Home / Self Care | Attending: Cardiology

## 2020-10-25 DIAGNOSIS — I200 Unstable angina: Secondary | ICD-10-CM

## 2020-10-25 LAB — PROTIME-INR
INR: 2.2 — ABNORMAL HIGH (ref 0.8–1.2)
Prothrombin Time: 23.7 seconds — ABNORMAL HIGH (ref 11.4–15.2)

## 2020-10-25 LAB — BASIC METABOLIC PANEL
Anion gap: 11 (ref 5–15)
BUN: 47 mg/dL — ABNORMAL HIGH (ref 8–23)
CO2: 21 mmol/L — ABNORMAL LOW (ref 22–32)
Calcium: 9 mg/dL (ref 8.9–10.3)
Chloride: 106 mmol/L (ref 98–111)
Creatinine, Ser: 1.81 mg/dL — ABNORMAL HIGH (ref 0.44–1.00)
GFR, Estimated: 29 mL/min — ABNORMAL LOW (ref >60)
Glucose, Bld: 154 mg/dL — ABNORMAL HIGH (ref 70–99)
Potassium: 5.1 mmol/L (ref 3.5–5.1)
Sodium: 138 mmol/L (ref 135–145)

## 2020-10-25 LAB — GLUCOSE, CAPILLARY
Glucose-Capillary: 165 mg/dL — ABNORMAL HIGH (ref 70–99)
Glucose-Capillary: 215 mg/dL — ABNORMAL HIGH (ref 70–99)
Glucose-Capillary: 229 mg/dL — ABNORMAL HIGH (ref 70–99)
Glucose-Capillary: 107 mg/dL — ABNORMAL HIGH (ref 70–99)
Glucose-Capillary: 113 mg/dL — ABNORMAL HIGH (ref 70–99)
Glucose-Capillary: 133 mg/dL — ABNORMAL HIGH (ref 70–99)

## 2020-10-25 SURGERY — LEFT HEART CATH AND CORONARY ANGIOGRAPHY
Anesthesia: LOCAL

## 2020-10-25 MED ORDER — SODIUM CHLORIDE 0.9 % IV SOLN: 250.0000 mL | INTRAVENOUS | Status: DC | PRN

## 2020-10-25 MED ORDER — LOPERAMIDE HCL 2 MG PO CAPS
2.0000 mg | ORAL_CAPSULE | ORAL | Status: DC | PRN
  Administered 2020-10-25: 03:00:00 2 mg via ORAL
  Filled 2020-10-25: qty 1

## 2020-10-25 MED ORDER — SODIUM CHLORIDE 0.9 % IV SOLN: INTRAVENOUS | Status: DC

## 2020-10-25 MED ORDER — ALUM & MAG HYDROXIDE-SIMETH 200-200-20 MG/5ML PO SUSP
30.0000 mL | ORAL | Status: DC | PRN
  Administered 2020-10-25: 03:00:00 30 mL via ORAL
  Filled 2020-10-25: qty 30

## 2020-10-25 NOTE — Progress Notes (Signed)
Progress Note  Patient Name: Jenna Mcdonald Date of Encounter: 10/25/2020  [facility] Cardiologist: Fransico Him, MD   Subjective   No dyspnea or CP  Inpatient Medications    Scheduled Meds: . aspirin  81 mg Oral Daily  . calcium-vitamin D  1 tablet Oral Q breakfast  . celecoxib  200 mg Oral Daily  . diltiazem  120 mg Oral BID  . insulin aspart  0-15 Units Subcutaneous TID WC  . insulin aspart  0-5 Units Subcutaneous QHS  . [START ON 10/28/2020] rosuvastatin  5 mg Oral Weekly  . sodium chloride flush  3 mL Intravenous Q12H   Continuous Infusions: . sodium chloride     PRN Meds: sodium chloride, acetaminophen, ALPRAZolam, alum & mag hydroxide-simeth, diphenhydrAMINE, loperamide, nitroGLYCERIN, ondansetron (ZOFRAN) IV, sodium chloride flush, zolpidem   Vital Signs    Vitals:   10/24/20 1249 10/24/20 1548 10/24/20 2142 10/25/20 0523  BP: (!) 126/57 (!) 148/52 (!) 123/56 124/75  Pulse: 79 74 82 87  Resp: 15 15 16 19   Temp: 98.3 F (36.8 C) 98.2 F (36.8 C) 98.2 F (36.8 C) 98.9 F (37.2 C)  TempSrc: Oral Oral Oral Oral  SpO2: 96% 100% 100% 98%  Weight:    111.7 kg  Height:        Intake/Output Summary (Last 24 hours) at 10/25/2020 0829 Last data filed at 10/24/2020 1000 Gross per 24 hour  Intake 480 ml  Output --  Net 480 ml   Last 3 Weights 10/25/2020 10/24/2020 10/23/2020  Weight (lbs) 246 lb 4.8 oz 245 lb 3.2 oz 250 lb  Weight (kg) 111.721 kg 111.222 kg 113.399 kg      Telemetry    Sinus - Personally Reviewed   Physical Exam   GEN: No acute distress.   Neck: No JVD Cardiac: RRR, 2/6 systolic murmur Respiratory: Clear to auscultation bilaterally. GI: Soft, nontender, non-distended  MS: No edema Neuro:  Nonfocal  Psych: Normal affect   Labs    High Sensitivity Troponin:   Recent Labs  Lab 10/12/20 1828 10/23/20 1714 10/23/20 1914 10/23/20 2135 10/24/20 0344  TROPONINIHS 12 12 13 13 13       Chemistry Recent Labs  Lab  10/23/20 1714 10/24/20 0344 10/25/20 0247  NA 140 139 138  K 4.8 4.7 5.1  CL 108 107 106  CO2 24 21* 21*  GLUCOSE 141* 134* 154*  BUN 29* 32* 47*  CREATININE 1.49* 1.46* 1.81*  CALCIUM 9.6 9.5 9.0  PROT  --  6.7  --   ALBUMIN  --  3.9  --   AST  --  17  --   ALT  --  18  --   ALKPHOS  --  84  --   BILITOT  --  1.1  --   GFRNONAA 36* 37* 29*  ANIONGAP 8 11 11      Hematology Recent Labs  Lab 10/23/20 1329 10/23/20 1714  WBC 8.7 7.8  RBC 6.31* 5.84*  HGB 11.7 11.1*  HCT 40.3 36.9  MCV 64* 63.2*  MCH 18.5* 19.0*  MCHC 29.0* 30.1  RDW 18.5* 18.4*  PLT 229 178    BNP Recent Labs  Lab 10/23/20 1329 10/23/20 2135  BNP  --  192.1*  PROBNP 558  --     Radiology    DG Chest 2 View  Result Date: 10/23/2020 CLINICAL DATA:  Chest pain EXAM: CHEST - 2 VIEW COMPARISON:  10/12/2020, 07/21/2018 FINDINGS: No focal opacity or pleural effusion.  Mild diffuse interstitial opacity some of which is felt chronic. Normal cardiomediastinal silhouette. No pneumothorax. IMPRESSION: No active cardiopulmonary disease. Electronically Signed   By: Donavan Foil M.D.   On: 10/23/2020 17:39    Patient Profile     75 y.o. female with past medical history of obstructive sleep apnea, hypertension, paroxysmal atrial fibrillation status post ablation, mild aortic stenosis, obesity, recent negative nuclear study admitted with dyspnea and chest pain.  Previous plan had been right and left cardiac catheterization.  Echocardiogram February 2022 showed normal LV function, mild left ventricular hypertrophy, grade 2 diastolic dysfunction, moderate left atrial enlargement.  There was mild aortic stenosis with mean gradient 11 mmHg.  Assessment & Plan    1 dyspnea/chest pain-previous nuclear study showed no ischemia.  Per Dr. Radford Pax plan is right and left cardiac catheterization.  INR 2.2 today and creatinine has increased.  We will delay catheterization today.  Tentatively scheduled for tomorrow.  The risk  and benefits including myocardial infarction, CVA and death discussed and she agrees to proceed.  We will gently hydrate beginning this evening prior to procedure.  Follow clinically as she has had difficulties with volume excess.  2 history of paroxysmal atrial fibrillation-patient remains in sinus rhythm.  Continue Cardizem at present dose.  Coumadin on hold.  3 history of mild aortic stenosis-patient will need follow-up echoes in the future.  4 history of obstructive sleep apnea-continue CPAP.  5 acute on chronic stage III kidney disease-creatinine increased to 1.81 today.  We will continue to hold diuretics prior to catheterization to decrease risk of contrast nephropathy.  We will gently hydrate today.  Recheck tomorrow morning.  Will need to limit diet time of catheterization.  No ventriculogram.  Follow renal function closely after procedure.  For questions or updates, please contact [place] Please consult [email or web address] for contact info under        Signed, Kirk Ruths, MD  10/25/2020, 8:29 AM

## 2020-10-25 NOTE — H&P (View-Only) (Signed)
Progress Note  Patient Name: Jenna Mcdonald Date of Encounter: 10/25/2020  [facility] Cardiologist: Fransico Him, MD   Subjective   No dyspnea or CP  Inpatient Medications    Scheduled Meds: . aspirin  81 mg Oral Daily  . calcium-vitamin D  1 tablet Oral Q breakfast  . celecoxib  200 mg Oral Daily  . diltiazem  120 mg Oral BID  . insulin aspart  0-15 Units Subcutaneous TID WC  . insulin aspart  0-5 Units Subcutaneous QHS  . [START ON 10/28/2020] rosuvastatin  5 mg Oral Weekly  . sodium chloride flush  3 mL Intravenous Q12H   Continuous Infusions: . sodium chloride     PRN Meds: sodium chloride, acetaminophen, ALPRAZolam, alum & mag hydroxide-simeth, diphenhydrAMINE, loperamide, nitroGLYCERIN, ondansetron (ZOFRAN) IV, sodium chloride flush, zolpidem   Vital Signs    Vitals:   10/24/20 1249 10/24/20 1548 10/24/20 2142 10/25/20 0523  BP: (!) 126/57 (!) 148/52 (!) 123/56 124/75  Pulse: 79 74 82 87  Resp: 15 15 16 19   Temp: 98.3 F (36.8 C) 98.2 F (36.8 C) 98.2 F (36.8 C) 98.9 F (37.2 C)  TempSrc: Oral Oral Oral Oral  SpO2: 96% 100% 100% 98%  Weight:    111.7 kg  Height:        Intake/Output Summary (Last 24 hours) at 10/25/2020 0829 Last data filed at 10/24/2020 1000 Gross per 24 hour  Intake 480 ml  Output --  Net 480 ml   Last 3 Weights 10/25/2020 10/24/2020 10/23/2020  Weight (lbs) 246 lb 4.8 oz 245 lb 3.2 oz 250 lb  Weight (kg) 111.721 kg 111.222 kg 113.399 kg      Telemetry    Sinus - Personally Reviewed   Physical Exam   GEN: No acute distress.   Neck: No JVD Cardiac: RRR, 2/6 systolic murmur Respiratory: Clear to auscultation bilaterally. GI: Soft, nontender, non-distended  MS: No edema Neuro:  Nonfocal  Psych: Normal affect   Labs    High Sensitivity Troponin:   Recent Labs  Lab 10/12/20 1828 10/23/20 1714 10/23/20 1914 10/23/20 2135 10/24/20 0344  TROPONINIHS 12 12 13 13 13       Chemistry Recent Labs  Lab  10/23/20 1714 10/24/20 0344 10/25/20 0247  NA 140 139 138  K 4.8 4.7 5.1  CL 108 107 106  CO2 24 21* 21*  GLUCOSE 141* 134* 154*  BUN 29* 32* 47*  CREATININE 1.49* 1.46* 1.81*  CALCIUM 9.6 9.5 9.0  PROT  --  6.7  --   ALBUMIN  --  3.9  --   AST  --  17  --   ALT  --  18  --   ALKPHOS  --  84  --   BILITOT  --  1.1  --   GFRNONAA 36* 37* 29*  ANIONGAP 8 11 11      Hematology Recent Labs  Lab 10/23/20 1329 10/23/20 1714  WBC 8.7 7.8  RBC 6.31* 5.84*  HGB 11.7 11.1*  HCT 40.3 36.9  MCV 64* 63.2*  MCH 18.5* 19.0*  MCHC 29.0* 30.1  RDW 18.5* 18.4*  PLT 229 178    BNP Recent Labs  Lab 10/23/20 1329 10/23/20 2135  BNP  --  192.1*  PROBNP 558  --     Radiology    DG Chest 2 View  Result Date: 10/23/2020 CLINICAL DATA:  Chest pain EXAM: CHEST - 2 VIEW COMPARISON:  10/12/2020, 07/21/2018 FINDINGS: No focal opacity or pleural effusion.  Mild diffuse interstitial opacity some of which is felt chronic. Normal cardiomediastinal silhouette. No pneumothorax. IMPRESSION: No active cardiopulmonary disease. Electronically Signed   By: Donavan Foil M.D.   On: 10/23/2020 17:39    Patient Profile     75 y.o. female with past medical history of obstructive sleep apnea, hypertension, paroxysmal atrial fibrillation status post ablation, mild aortic stenosis, obesity, recent negative nuclear study admitted with dyspnea and chest pain.  Previous plan had been right and left cardiac catheterization.  Echocardiogram February 2022 showed normal LV function, mild left ventricular hypertrophy, grade 2 diastolic dysfunction, moderate left atrial enlargement.  There was mild aortic stenosis with mean gradient 11 mmHg.  Assessment & Plan    1 dyspnea/chest pain-previous nuclear study showed no ischemia.  Per Dr. Radford Pax plan is right and left cardiac catheterization.  INR 2.2 today and creatinine has increased.  We will delay catheterization today.  Tentatively scheduled for tomorrow.  The risk  and benefits including myocardial infarction, CVA and death discussed and she agrees to proceed.  We will gently hydrate beginning this evening prior to procedure.  Follow clinically as she has had difficulties with volume excess.  2 history of paroxysmal atrial fibrillation-patient remains in sinus rhythm.  Continue Cardizem at present dose.  Coumadin on hold.  3 history of mild aortic stenosis-patient will need follow-up echoes in the future.  4 history of obstructive sleep apnea-continue CPAP.  5 acute on chronic stage III kidney disease-creatinine increased to 1.81 today.  We will continue to hold diuretics prior to catheterization to decrease risk of contrast nephropathy.  We will gently hydrate today.  Recheck tomorrow morning.  Will need to limit diet time of catheterization.  No ventriculogram.  Follow renal function closely after procedure.  For questions or updates, please contact [place] Please consult [email or web address] for contact info under        Signed, Kirk Ruths, MD  10/25/2020, 8:29 AM

## 2020-10-25 NOTE — Progress Notes (Signed)
[place] for heparin Indication: chest pain/ACS  Allergies  Allergen Reactions  . Metoprolol Shortness Of Breath, Diarrhea and Other (See Comments)    Fatigue   . Gemfibrozil Other (See Comments)    Severe fatigue  . Other Other (See Comments)    Numerous cancer drugs - unknown reaction  . Tamoxifen Other (See Comments)    Bone and body aches   . Tetracyclines & Related Other (See Comments)    Vaginal infections    . Toprol Xl [Metoprolol Tartrate] Other (See Comments)    Hair loss  . Verapamil Other (See Comments)    Unknown  . Vicodin [Hydrocodone-Acetaminophen] Nausea And Vomiting and Other (See Comments)    Patient reports terrible nausea; plain tylenol is ok with patient  . Aromasin [Exemestane] Other (See Comments)    Hair loss     Patient Measurements: Height: 5\' 5"  (165.1 cm) Weight: 111.7 kg (246 lb 4.8 oz) IBW/kg (Calculated) : 57 Heparin Dosing Weight: 83 kg   Vital Signs: Temp: 98.9 F (37.2 C) (03/03 0523) Temp Source: Oral (03/03 0523) BP: 124/75 (03/03 0523) Pulse Rate: 87 (03/03 0523)  Labs: Recent Labs    10/23/20 1329 10/23/20 1329 10/23/20 1714 10/23/20 1914 10/23/20 2135 10/24/20 0344 10/25/20 0247  HGB 11.7  --  11.1*  --   --   --   --   HCT 40.3  --  36.9  --   --   --   --   PLT 229  --  178  --   --   --   --   LABPROT  --   --   --   --  27.0* 25.6* 23.7*  INR  --   --   --   --  2.6* 2.4* 2.2*  CREATININE 1.20*  --  1.49*  --   --  1.46* 1.81*  TROPONINIHS  --    < > 12 13 13 13   --    < > = values in this interval not displayed.    Estimated Creatinine Clearance: 33.5 mL/min (A) (by C-G formula based on SCr of 1.81 mg/dL (H)).   Medical History: Past Medical History:  Diagnosis Date  . Anemia   . Aortic stenosis    mild by echo 09/2020 with mean AVG 73mmHg  . Breast CA ([place])    left  . Breast cancer ([place])   . Carotid stenosis    1-39% left  . Degenerative disc disease,  lumbar    of the knees  . Diabetes mellitus   . Dysrhythmia   . 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[place])   . Fatigue    Severe secondary to to gemfibrozil  . Gastroesophageal reflux disease    denies  . GI bleeding    a. 06/2018: EGD showed multiple nonbleeding duodenal ulcers. 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  . Hammertoe    left second toe  . Hypercholesteremia   . Hypertension   . Insomnia   . Irritable bowel   . Low back pain   . Metabolic syndrome   . Morbid obesity ([place])   . OSA on CPAP \   bipap  . Osteoarthritis   . Osteopenia   . Persistent atrial fibrillation [facility])    s/p afib ablation - Dr. Rayann Heman 08/2018  . Personal history of radiation therapy   . Pneumonia   . PONV (postoperative nausea and vomiting)   .  Sigmoid diverticulosis   . Thalassemia minor   . Vitamin D deficiency     Medications:  Medications Prior to Admission  Medication Sig Dispense Refill Last Dose  . Calcium Carbonate-Vitamin D (CALCIUM 600 + D PO) Take 1 tablet by mouth daily.    10/22/2020 at Unknown time  . celecoxib (CELEBREX) 200 MG capsule Take 200 mg by mouth daily.   10/22/2020 at Unknown time  . Cholecalciferol (VITAMIN D3) 25 MCG (1000 UT) CAPS Take by mouth.   10/22/2020 at Unknown time  . diltiazem (CARDIZEM CD) 120 MG 24 hr capsule Take 1 capsule (120 mg total) by mouth in the morning and at bedtime. 180 capsule 3 10/22/2020 at Unknown time  . diphenhydramine-acetaminophen (TYLENOL PM) 25-500 MG TABS tablet Take 2 tablets by mouth as needed (sleep).    Past Week at Unknown time  . furosemide (LASIX) 40 MG tablet Take 1 tablet (40 mg total) by mouth daily. 90 tablet 3 Past Week at Unknown time  . glimepiride (AMARYL) 4 MG tablet Take 2 mg by mouth at bedtime.   10/22/2020 at Unknown time  . metFORMIN (GLUCOPHAGE-XR) 500 MG 24 hr tablet Take 500 mg by mouth 2 (two) times daily.   3 10/23/2020 at 0800  . rosuvastatin (CRESTOR) 5 MG  tablet Take 5 mg by mouth once a week. Pt states she is taking weekly per Dr. Inda Merlin.   Past Week at Unknown time  . valsartan (DIOVAN) 160 MG tablet 1 tablet     . warfarin (COUMADIN) 4 MG tablet TAKE ON MONDAY, WEDNESDAY AND FRIDAY AS DIRECTED BY THE COUMADIN CLINIC 45 tablet 1 10/22/2020 at Unknown time  . warfarin (COUMADIN) 5 MG tablet TAKE ON SUNDAY, TUESDAY, THURSDAY, AND SATURDAY OR TAKE AS DIRECTED BY THE COUMADIN CLINIC. 60 tablet 1 Past Week at Unknown time  . diclofenac Sodium (VOLTAREN) 1 % GEL Apply 4 g topically daily. (Patient not taking: Reported on 10/23/2020)   Not Taking at Unknown time  . ONETOUCH ULTRA test strip 1 each by Other route daily.     . ORTHOVISC 30 MG/2ML SOSY 1 syringe intraarticularly once a week (Patient not taking: Reported on 10/23/2020)   Completed Course at Unknown time    Assessment: 69 YOF complaining of chest pressure on warfarin at home. Pharmacy consulted to start IV heparin for ACS when INR drifts to less than 2. INR on admission is 2.6 drifted down to 2.2  No plans for reversal today > Planning cath tomorrow am if INR is down.   Goal of Therapy:  Heparin level 0.3-0.7 units/ml Monitor platelets by anticoagulation protocol: Yes   Plan:  -Recheck INR in AM. Will plan to start IV heparin when INR is less than 2    Bonnita Nasuti Pharm.D. CPP, BCPS Clinical Pharmacist [phone] 10/25/2020 8:13 AM

## 2020-10-26 ENCOUNTER — Encounter (HOSPITAL_COMMUNITY)
Admission: EM | Disposition: A | Discharge status: Home / Self Care | Payer: Self-pay | Source: Home / Self Care | Attending: Cardiology

## 2020-10-26 ENCOUNTER — Ambulatory Visit (HOSPITAL_COMMUNITY): Admission: RE | Admit: 2020-10-26 | Payer: Medicare PPO | Source: Home / Self Care | Admitting: Cardiology

## 2020-10-26 ENCOUNTER — Encounter (HOSPITAL_COMMUNITY): Payer: Self-pay | Admitting: Cardiology

## 2020-10-26 DIAGNOSIS — I4819 Other persistent atrial fibrillation: Secondary | ICD-10-CM

## 2020-10-26 DIAGNOSIS — I200 Unstable angina: Secondary | ICD-10-CM | POA: Diagnosis not present

## 2020-10-26 DIAGNOSIS — R0602 Shortness of breath: Secondary | ICD-10-CM

## 2020-10-26 DIAGNOSIS — I4949 Other premature depolarization: Secondary | ICD-10-CM | POA: Diagnosis not present

## 2020-10-26 HISTORY — PX: RIGHT/LEFT HEART CATH AND CORONARY ANGIOGRAPHY: CATH118266

## 2020-10-26 LAB — GLUCOSE, CAPILLARY
Glucose-Capillary: 146 mg/dL — ABNORMAL HIGH (ref 70–99)
Glucose-Capillary: 171 mg/dL — ABNORMAL HIGH (ref 70–99)
Glucose-Capillary: 186 mg/dL — ABNORMAL HIGH (ref 70–99)
Glucose-Capillary: 152 mg/dL — ABNORMAL HIGH (ref 70–99)

## 2020-10-26 LAB — BASIC METABOLIC PANEL
Anion gap: 9 (ref 5–15)
BUN: 45 mg/dL — ABNORMAL HIGH (ref 8–23)
CO2: 21 mmol/L — ABNORMAL LOW (ref 22–32)
Calcium: 8.8 mg/dL — ABNORMAL LOW (ref 8.9–10.3)
Chloride: 106 mmol/L (ref 98–111)
Creatinine, Ser: 1.6 mg/dL — ABNORMAL HIGH (ref 0.44–1.00)
GFR, Estimated: 33 mL/min — ABNORMAL LOW (ref >60)
Glucose, Bld: 162 mg/dL — ABNORMAL HIGH (ref 70–99)
Potassium: 4.8 mmol/L (ref 3.5–5.1)
Sodium: 136 mmol/L (ref 135–145)

## 2020-10-26 LAB — POCT I-STAT EG7
Acid-base deficit: 2 mmol/L (ref 0.0–2.0)
Acid-base deficit: 2 mmol/L (ref 0.0–2.0)
Bicarbonate: 23.9 mmol/L (ref 20.0–28.0)
Bicarbonate: 24.2 mmol/L (ref 20.0–28.0)
Calcium, Ion: 1.19 mmol/L (ref 1.15–1.40)
Calcium, Ion: 1.22 mmol/L (ref 1.15–1.40)
HCT: 33 % — ABNORMAL LOW (ref 36.0–46.0)
HCT: 33 % — ABNORMAL LOW (ref 36.0–46.0)
Hemoglobin: 11.2 g/dL — ABNORMAL LOW (ref 12.0–15.0)
Hemoglobin: 11.2 g/dL — ABNORMAL LOW (ref 12.0–15.0)
O2 Saturation: 68 %
O2 Saturation: 69 %
Potassium: 4.6 mmol/L (ref 3.5–5.1)
Potassium: 4.6 mmol/L (ref 3.5–5.1)
Sodium: 142 mmol/L (ref 135–145)
Sodium: 142 mmol/L (ref 135–145)
TCO2: 25 mmol/L (ref 22–32)
TCO2: 26 mmol/L (ref 22–32)
pCO2, Ven: 44.5 mmHg (ref 44.0–60.0)
pCO2, Ven: 45 mmHg (ref 44.0–60.0)
pH, Ven: 7.338 (ref 7.250–7.430)
pH, Ven: 7.34 (ref 7.250–7.430)
pO2, Ven: 38 mmHg (ref 32.0–45.0)
pO2, Ven: 38 mmHg (ref 32.0–45.0)

## 2020-10-26 LAB — POCT I-STAT 7, (LYTES, BLD GAS, ICA,H+H)
Acid-base deficit: 2 mmol/L (ref 0.0–2.0)
Bicarbonate: 23.3 mmol/L (ref 20.0–28.0)
Calcium, Ion: 1.23 mmol/L (ref 1.15–1.40)
HCT: 34 % — ABNORMAL LOW (ref 36.0–46.0)
Hemoglobin: 11.6 g/dL — ABNORMAL LOW (ref 12.0–15.0)
O2 Saturation: 97 %
Potassium: 4.7 mmol/L (ref 3.5–5.1)
Sodium: 140 mmol/L (ref 135–145)
TCO2: 25 mmol/L (ref 22–32)
pCO2 arterial: 40.2 mmHg (ref 32.0–48.0)
pH, Arterial: 7.372 (ref 7.350–7.450)
pO2, Arterial: 90 mmHg (ref 83.0–108.0)

## 2020-10-26 LAB — PROTIME-INR
INR: 1.7 — ABNORMAL HIGH (ref 0.8–1.2)
Prothrombin Time: 19 seconds — ABNORMAL HIGH (ref 11.4–15.2)

## 2020-10-26 SURGERY — RIGHT/LEFT HEART CATH AND CORONARY ANGIOGRAPHY
Anesthesia: LOCAL

## 2020-10-26 MED ORDER — SODIUM CHLORIDE 0.9% FLUSH: 3.0000 mL | INTRAVENOUS | Status: DC | PRN

## 2020-10-26 MED ORDER — ASPIRIN 81 MG PO CHEW: 81.0000 mg | CHEWABLE_TABLET | Freq: Every day | ORAL | Status: DC

## 2020-10-26 MED ORDER — HEPARIN SODIUM (PORCINE) 1000 UNIT/ML IJ SOLN
INTRAMUSCULAR | Status: AC
  Filled 2020-10-26: qty 1

## 2020-10-26 MED ORDER — ASPIRIN 81 MG PO CHEW: 81.0000 mg | CHEWABLE_TABLET | ORAL | Status: DC

## 2020-10-26 MED ORDER — ASPIRIN 81 MG PO CHEW
81.0000 mg | CHEWABLE_TABLET | ORAL | Status: AC
  Administered 2020-10-26: 81 mg via ORAL
  Filled 2020-10-26: qty 1

## 2020-10-26 MED ORDER — VERAPAMIL HCL 2.5 MG/ML IV SOLN
INTRAVENOUS | Status: DC | PRN
  Administered 2020-10-26: 10 mL via INTRA_ARTERIAL

## 2020-10-26 MED ORDER — SODIUM CHLORIDE 0.9 % IV SOLN: INTRAVENOUS | Status: DC

## 2020-10-26 MED ORDER — SODIUM CHLORIDE 0.9% FLUSH
3.0000 mL | INTRAVENOUS | Status: DC | PRN
  Administered 2020-10-26: 3 mL via INTRAVENOUS

## 2020-10-26 MED ORDER — MIDAZOLAM HCL 2 MG/2ML IJ SOLN
INTRAMUSCULAR | Status: DC | PRN
  Administered 2020-10-26 (×2): 1 mg via INTRAVENOUS

## 2020-10-26 MED ORDER — SODIUM CHLORIDE 0.9 % IV SOLN: INTRAVENOUS | Status: AC

## 2020-10-26 MED ORDER — HEPARIN SODIUM (PORCINE) 1000 UNIT/ML IJ SOLN
INTRAMUSCULAR | Status: DC | PRN
  Administered 2020-10-26: 6000 [IU] via INTRAVENOUS

## 2020-10-26 MED ORDER — SODIUM CHLORIDE 0.9% FLUSH
3.0000 mL | Freq: Two times a day (BID) | INTRAVENOUS | Status: DC
  Administered 2020-10-26 – 2020-10-27 (×3): 3 mL via INTRAVENOUS

## 2020-10-26 MED ORDER — MIDAZOLAM HCL 2 MG/2ML IJ SOLN
INTRAMUSCULAR | Status: AC
  Filled 2020-10-26: qty 2

## 2020-10-26 MED ORDER — HEPARIN (PORCINE) IN NACL 1000-0.9 UT/500ML-% IV SOLN
INTRAVENOUS | Status: AC
  Filled 2020-10-26: qty 1000

## 2020-10-26 MED ORDER — FENTANYL CITRATE (PF) 100 MCG/2ML IJ SOLN
INTRAMUSCULAR | Status: AC
  Filled 2020-10-26: qty 2

## 2020-10-26 MED ORDER — HEPARIN (PORCINE) IN NACL 1000-0.9 UT/500ML-% IV SOLN
INTRAVENOUS | Status: DC | PRN
  Administered 2020-10-26 (×2): 500 mL

## 2020-10-26 MED ORDER — SODIUM CHLORIDE 0.9 % IV SOLN: 250.0000 mL | INTRAVENOUS | Status: DC | PRN

## 2020-10-26 MED ORDER — IOHEXOL 350 MG/ML SOLN
INTRAVENOUS | Status: DC | PRN
  Administered 2020-10-26: 25 mL via INTRA_ARTERIAL

## 2020-10-26 MED ORDER — FENTANYL CITRATE (PF) 100 MCG/2ML IJ SOLN
INTRAMUSCULAR | Status: DC | PRN
  Administered 2020-10-26: 25 ug via INTRAVENOUS

## 2020-10-26 MED ORDER — VERAPAMIL HCL 2.5 MG/ML IV SOLN
INTRAVENOUS | Status: AC
  Filled 2020-10-26: qty 2

## 2020-10-26 MED ORDER — WARFARIN SODIUM 5 MG PO TABS
5.0000 mg | ORAL_TABLET | Freq: Once | ORAL | Status: AC
  Administered 2020-10-26: 5 mg via ORAL
  Filled 2020-10-26: qty 1

## 2020-10-26 MED ORDER — LIDOCAINE HCL (PF) 1 % IJ SOLN
INTRAMUSCULAR | Status: AC
  Filled 2020-10-26: qty 30

## 2020-10-26 MED ORDER — SODIUM CHLORIDE 0.9% FLUSH: 3.0000 mL | Freq: Two times a day (BID) | INTRAVENOUS | Status: DC

## 2020-10-26 MED ORDER — LIDOCAINE HCL (PF) 1 % IJ SOLN
INTRAMUSCULAR | Status: DC | PRN
  Administered 2020-10-26 (×2): 2 mL

## 2020-10-26 MED ORDER — WARFARIN - PHARMACIST DOSING INPATIENT: Freq: Every day | Status: DC

## 2020-10-26 MED ORDER — HYDRALAZINE HCL 20 MG/ML IJ SOLN
10.0000 mg | INTRAMUSCULAR | Status: AC | PRN
Start: 2020-10-26 — End: 2020-10-26

## 2020-10-26 SURGICAL SUPPLY — 13 items
CATH BALLN WEDGE 5F 110CM (CATHETERS) ×2 IMPLANT
CATH INFINITI 5FR ANG PIGTAIL (CATHETERS) ×2 IMPLANT
CATH OPTITORQUE TIG 4.0 5F (CATHETERS) ×2 IMPLANT
DEVICE RAD COMP TR BAND LRG (VASCULAR PRODUCTS) ×2 IMPLANT
GLIDESHEATH SLEND SS 6F .021 (SHEATH) ×2 IMPLANT
GUIDEWIRE INQWIRE 1.5J.035X260 (WIRE) ×1 IMPLANT
INQWIRE 1.5J .035X260CM (WIRE) ×2
KIT HEART LEFT (KITS) ×2 IMPLANT
PACK CARDIAC CATHETERIZATION (CUSTOM PROCEDURE TRAY) ×2 IMPLANT
SHEATH GLIDE SLENDER 4/5FR (SHEATH) ×4 IMPLANT
TRANSDUCER W/STOPCOCK (MISCELLANEOUS) ×2 IMPLANT
TUBING CIL FLEX 10 FLL-RA (TUBING) ×2 IMPLANT
WIRE EMERALD 3MM-J .025X260CM (WIRE) ×2 IMPLANT

## 2020-10-26 NOTE — Progress Notes (Signed)
Progress Note  Patient Name: Jenna Mcdonald Date of Encounter: 10/26/2020  [facility] Cardiologist: Fransico Him, MD   Subjective   No CP or dyspnea  Inpatient Medications    Scheduled Meds: . [START ON 10/27/2020] aspirin  81 mg Oral Daily  . calcium-vitamin D  1 tablet Oral Q breakfast  . celecoxib  200 mg Oral Daily  . diltiazem  120 mg Oral BID  . insulin aspart  0-15 Units Subcutaneous TID WC  . insulin aspart  0-5 Units Subcutaneous QHS  . [START ON 10/28/2020] rosuvastatin  5 mg Oral Weekly  . sodium chloride flush  3 mL Intravenous Q12H   Continuous Infusions: . sodium chloride 75 mL/hr at 10/26/20 0823  . sodium chloride    . sodium chloride     PRN Meds: sodium chloride, acetaminophen, ALPRAZolam, alum & mag hydroxide-simeth, diphenhydrAMINE, hydrALAZINE, loperamide, nitroGLYCERIN, ondansetron (ZOFRAN) IV, sodium chloride flush, zolpidem   Vital Signs    Vitals:   10/26/20 0846 10/26/20 0846 10/26/20 0848 10/26/20 0922  BP:   130/73 (!) 148/49  Pulse: 70 68 99 82  Resp: 14 16 17 18   Temp:    (!) 97.4 F (36.3 C)  TempSrc:    Oral  SpO2: 99% 98% 99% 98%  Weight:      Height:       No intake or output data in the 24 hours ending 10/26/20 0954 Last 3 Weights 10/26/2020 10/26/2020 10/25/2020  Weight (lbs) 249 lb 248 lb 9.6 oz 246 lb 4.8 oz  Weight (kg) 112.946 kg 112.764 kg 111.721 kg      Telemetry    Sinus - Personally Reviewed   Physical Exam   GEN: NAD Neck: Supple Cardiac: RRR, 2/6 systolic murmur, no gallop Respiratory: CTA GI: Soft, NT ND MS: No edema; TR band in place Neuro:  Grossly intact Psych: Normal affect   Labs    High Sensitivity Troponin:   Recent Labs  Lab 10/12/20 1828 10/23/20 1714 10/23/20 1914 10/23/20 2135 10/24/20 0344  TROPONINIHS 12 12 13 13 13       Chemistry Recent Labs  Lab 10/24/20 0344 10/25/20 0247 10/26/20 0259  NA 139 138 136  K 4.7 5.1 4.8  CL 107 106 106  CO2 21* 21* 21*  GLUCOSE  134* 154* 162*  BUN 32* 47* 45*  CREATININE 1.46* 1.81* 1.60*  CALCIUM 9.5 9.0 8.8*  PROT 6.7  --   --   ALBUMIN 3.9  --   --   AST 17  --   --   ALT 18  --   --   ALKPHOS 84  --   --   BILITOT 1.1  --   --   GFRNONAA 37* 29* 33*  ANIONGAP 11 11 9      Hematology Recent Labs  Lab 10/23/20 1329 10/23/20 1714  WBC 8.7 7.8  RBC 6.31* 5.84*  HGB 11.7 11.1*  HCT 40.3 36.9  MCV 64* 63.2*  MCH 18.5* 19.0*  MCHC 29.0* 30.1  RDW 18.5* 18.4*  PLT 229 178    BNP Recent Labs  Lab 10/23/20 1329 10/23/20 2135  BNP  --  192.1*  PROBNP 558  --     Radiology    CARDIAC CATHETERIZATION  Result Date: 10/26/2020  Hemodynamic findings consistent with mild pulmonary hypertension.  Angiographically normal coronary arteries  SUMMARY  Angiographically Normal Coronary Arteries  Mildly elevated RHC pressures with a PA mean of 25 million mercury, PCWP of 24 mmHg and  LVEDP of 20 mmHg.  High V wave Pressure on PCWP - consider valvular disease  Cardiac Output 6.6, Cardiac Index 3.04 by Fick (LV gram not performed) RECOMMENDATIONS  Considered nonischemic etiology of her symptoms including potentially A. fib related HFpEF, also cannot exclude MINOCA (microvascular disease)  Okay to restart warfarin this evening; if bridging with IV heparin recommended, would start 8 hours after sheath removal. [name], MD   Patient Profile     75 y.o. female with past medical history of obstructive sleep apnea, hypertension, paroxysmal atrial fibrillation status post ablation, mild aortic stenosis, obesity, recent negative nuclear study admitted with dyspnea and chest pain.  Previous plan had been right and left cardiac catheterization.  Echocardiogram February 2022 showed normal LV function, mild left ventricular hypertrophy, grade 2 diastolic dysfunction, moderate left atrial enlargement.  There was mild aortic stenosis with mean gradient 11 mmHg.  Assessment & Plan    1 dyspnea/chest pain-cardiac  catheterization today revealed normal coronary arteries.  Pulmonary capillary wedge pressure elevated at 24.  Will begin lasix 40 mg daily tomorrow morning if renal function stable.  2 history of paroxysmal atrial fibrillation-patient remains in sinus rhythm.  Continue Cardizem at present dose.  Begin Coumadin at home dose.  Check INR Tuesday in clinic.  I have discussed apixaban with patient and she will check with her insurance company to see if affordable.  3 history of mild aortic stenosis-patient will need follow-up echoes in the future.  4 history of obstructive sleep apnea-continue CPAP.  5 acute on chronic stage III kidney disease-creatinine 1.6 today.  Will not diurese today.  Recheck tomorrow morning and if renal function stable discharge home.  Would likely need to recheck renal function Monday or Tuesday of next week.  For questions or updates, please contact [place] Please consult [email or web address] for contact info under        Signed, Kirk Ruths, MD  10/26/2020, 9:54 AM

## 2020-10-26 NOTE — Interval H&P Note (Signed)
History and Physical Interval Note:  10/26/2020 7:47 AM  Jenna Mcdonald  has presented today for surgery, with the diagnosis of Chest Pain.  The various methods of treatment have been discussed with the patient and family. After consideration of risks, benefits and other options for treatment, the patient has consented to  Procedure(s): RIGHT/LEFT HEART CATH AND CORONARY ANGIOGRAPHY (N/A)  PERCUTANEOUS CORONARY INTERVENTION  as a surgical intervention.  The patient's history has been reviewed, patient examined, no change in status, stable for surgery.  I have reviewed the patient's chart and labs.  Questions were answered to the patient's satisfaction.    Cath Lab Visit (complete for each Cath Lab visit)  Clinical Evaluation Leading to the Procedure:   ACS: Yes.    Non-ACS:    Anginal Classification: CCS III  Anti-ischemic medical therapy: Minimal Therapy (1 class of medications)  Non-Invasive Test Results: Equivocal test results - Reduced EF on Myoview - normal on Echo.  No ischemia on Myoview,but continued symptoms.  Prior CABG: No previous CABG   [name]

## 2020-10-26 NOTE — Progress Notes (Signed)
[facility]in  Indication: chest pain/ACS  Allergies  Allergen Reactions  . Metoprolol Shortness Of Breath, Diarrhea and Other (See Comments)    Fatigue   . Gemfibrozil Other (See Comments)    Severe fatigue  . Other Other (See Comments)    Numerous cancer drugs - unknown reaction  . Tamoxifen Other (See Comments)    Bone and body aches   . Tetracyclines & Related Other (See Comments)    Vaginal infections    . Toprol Xl [Metoprolol Tartrate] Other (See Comments)    Hair loss  . Verapamil Other (See Comments)    Unknown  . Vicodin [Hydrocodone-Acetaminophen] Nausea And Vomiting and Other (See Comments)    Patient reports terrible nausea; plain tylenol is ok with patient  . Aromasin [Exemestane] Other (See Comments)    Hair loss     Patient Measurements: Height: 5\' 5"  (165.1 cm) Weight: 112.9 kg (249 lb) IBW/kg (Calculated) : 57 Heparin Dosing Weight: 83 kg   Vital Signs: Temp: 97.4 F (36.3 C) (03/04 0922) Temp Source: Oral (03/04 0922) BP: 148/49 (03/04 0922) Pulse Rate: 82 (03/04 0922)  Labs: Recent Labs    10/23/20 1329 10/23/20 1329 10/23/20 1714 10/23/20 1914 10/23/20 2135 10/23/20 2135 10/24/20 0344 10/25/20 0247 10/26/20 0259  HGB 11.7  --  11.1*  --   --   --   --   --   --   HCT 40.3  --  36.9  --   --   --   --   --   --   PLT 229  --  178  --   --   --   --   --   --   LABPROT  --   --   --   --  27.0*   < > 25.6* 23.7* 19.0*  INR  --   --   --   --  2.6*   < > 2.4* 2.2* 1.7*  CREATININE 1.20*  --  1.49*  --   --   --  1.46* 1.81* 1.60*  TROPONINIHS  --    < > 12 13 13   --  13  --   --    < > = values in this interval not displayed.    Estimated Creatinine Clearance: 38.1 mL/min (A) (by C-G formula based on SCr of 1.6 mg/dL (H)).   Medical History: Past Medical History:  Diagnosis Date  . Anemia   . Aortic stenosis    mild by echo 09/2020 with mean AVG 64mmHg  . Breast CA ([place])    left   . Breast cancer ([place])   . Carotid stenosis    1-39% left  . Degenerative disc disease, lumbar    of the knees  . Diabetes mellitus   . Dysrhythmia   . 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[place])   . Fatigue    Severe secondary to to gemfibrozil  . Gastroesophageal reflux disease    denies  . GI bleeding    a. 06/2018: EGD showed multiple nonbleeding duodenal ulcers. 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  . Hammertoe    left second toe  . Hypercholesteremia   . Hypertension   . Insomnia   . Irritable bowel   . Low back pain   . Metabolic syndrome   . Morbid obesity ([place])   . OSA on CPAP \   bipap  . Osteoarthritis   .  Osteopenia   . Persistent atrial fibrillation [facility])    s/p afib ablation - Dr. Rayann Heman 08/2018  . Personal history of radiation therapy   . Pneumonia   . PONV (postoperative nausea and vomiting)   . Sigmoid diverticulosis   . Thalassemia minor   . Vitamin D deficiency     Medications:  Medications Prior to Admission  Medication Sig Dispense Refill Last Dose  . Calcium Carbonate-Vitamin D (CALCIUM 600 + D PO) Take 1 tablet by mouth daily.    10/22/2020 at Unknown time  . celecoxib (CELEBREX) 200 MG capsule Take 200 mg by mouth daily.   10/22/2020 at Unknown time  . Cholecalciferol (VITAMIN D3) 25 MCG (1000 UT) CAPS Take by mouth.   10/22/2020 at Unknown time  . diltiazem (CARDIZEM CD) 120 MG 24 hr capsule Take 1 capsule (120 mg total) by mouth in the morning and at bedtime. 180 capsule 3 10/22/2020 at Unknown time  . diphenhydramine-acetaminophen (TYLENOL PM) 25-500 MG TABS tablet Take 2 tablets by mouth as needed (sleep).    Past Week at Unknown time  . furosemide (LASIX) 40 MG tablet Take 1 tablet (40 mg total) by mouth daily. 90 tablet 3 Past Week at Unknown time  . glimepiride (AMARYL) 4 MG tablet Take 2 mg by mouth at bedtime.   10/22/2020 at Unknown time  . metFORMIN (GLUCOPHAGE-XR) 500 MG 24 hr tablet Take  500 mg by mouth 2 (two) times daily.   3 10/23/2020 at 0800  . rosuvastatin (CRESTOR) 5 MG tablet Take 5 mg by mouth once a week. Pt states she is taking weekly per Dr. Inda Merlin.   Past Week at Unknown time  . valsartan (DIOVAN) 160 MG tablet 1 tablet     . warfarin (COUMADIN) 4 MG tablet TAKE ON MONDAY, WEDNESDAY AND FRIDAY AS DIRECTED BY THE COUMADIN CLINIC 45 tablet 1 10/22/2020 at Unknown time  . warfarin (COUMADIN) 5 MG tablet TAKE ON SUNDAY, TUESDAY, THURSDAY, AND SATURDAY OR TAKE AS DIRECTED BY THE COUMADIN CLINIC. 60 tablet 1 Past Week at Unknown time  . diclofenac Sodium (VOLTAREN) 1 % GEL Apply 4 g topically daily. (Patient not taking: Reported on 10/23/2020)   Not Taking at Unknown time  . ONETOUCH ULTRA test strip 1 each by Other route daily.     . ORTHOVISC 30 MG/2ML SOSY 1 syringe intraarticularly once a week (Patient not taking: Reported on 10/23/2020)   Completed Course at Unknown time    Assessment: 43 YOF complaining of chest pressure on warfarin at home. Pharmacy consulted to start IV heparin for ACS when INR drifts to less than 2. INR on admission is 2.6 drifted down to 2.2   Today's INR down to 1.7, now s/p cath this AM.  Pharmacy asked to resume Coumadin after 5 pm tonight.  No plans for heparin bridge.  PTA Coumadin dose 5 mg daily except 4 mg on MWF.  Ran test claim for Eliquis - would be $40/month for her after prior authorization done.  Patient feels she cannot afford $40/month so I did not begin the PA process.  Goal of Therapy:  Heparin level 0.3-0.7 units/ml Monitor platelets by anticoagulation protocol: Yes   Plan:  Coumadin 5 mg po x 1 tonight. Daily INR.  [place] Crane, Roylene Reason, BCCP Clinical Pharmacist  10/26/2020 12:22 PM   [facility] pharmacy phone numbers are listed on [email or web address]

## 2020-10-26 NOTE — TOC Benefit Eligibility Note (Addendum)
Patient Teacher, English as a foreign language completed.    The patient is currently admitted and upon discharge could possibly be taking Eliquis 5 mg.  The current 30 day co-pay is, $40.00 if a Prior Authorization is completed..   The patient is insured through [place] patient has Medicare, would seek the use of a grant if available. If not, would seek manufacturer assistance if the co-pay is unaffordable.   Lyndel Safe, [place] Patient Advocate Specialist [place] Ant[facility] Stewardship Team Direct Number: [phone]  Fax: [phone]

## 2020-10-27 DIAGNOSIS — N1832 Chronic kidney disease, stage 3b: Secondary | ICD-10-CM

## 2020-10-27 DIAGNOSIS — I5032 Chronic diastolic (congestive) heart failure: Secondary | ICD-10-CM

## 2020-10-27 DIAGNOSIS — I493 Ventricular premature depolarization: Secondary | ICD-10-CM | POA: Diagnosis not present

## 2020-10-27 DIAGNOSIS — R0602 Shortness of breath: Secondary | ICD-10-CM | POA: Diagnosis not present

## 2020-10-27 LAB — BASIC METABOLIC PANEL
Anion gap: 9 (ref 5–15)
BUN: 30 mg/dL — ABNORMAL HIGH (ref 8–23)
CO2: 22 mmol/L (ref 22–32)
Calcium: 9 mg/dL (ref 8.9–10.3)
Chloride: 105 mmol/L (ref 98–111)
Creatinine, Ser: 1.33 mg/dL — ABNORMAL HIGH (ref 0.44–1.00)
GFR, Estimated: 42 mL/min — ABNORMAL LOW (ref >60)
Glucose, Bld: 147 mg/dL — ABNORMAL HIGH (ref 70–99)
Potassium: 4.8 mmol/L (ref 3.5–5.1)
Sodium: 136 mmol/L (ref 135–145)

## 2020-10-27 LAB — GLUCOSE, CAPILLARY
Glucose-Capillary: 203 mg/dL — ABNORMAL HIGH (ref 70–99)
Glucose-Capillary: 182 mg/dL — ABNORMAL HIGH (ref 70–99)

## 2020-10-27 LAB — PROTIME-INR
INR: 1.4 — ABNORMAL HIGH (ref 0.8–1.2)
Prothrombin Time: 16.7 seconds — ABNORMAL HIGH (ref 11.4–15.2)

## 2020-10-27 MED ORDER — WARFARIN SODIUM 5 MG PO TABS: 5.0000 mg | ORAL_TABLET | Freq: Once | ORAL | Status: DC

## 2020-10-27 MED ORDER — FUROSEMIDE 40 MG PO TABS
40.0000 mg | ORAL_TABLET | Freq: Every day | ORAL | Status: DC
  Administered 2020-10-27: 40 mg via ORAL
  Filled 2020-10-27: qty 1

## 2020-10-27 NOTE — Progress Notes (Signed)
[facility] for Warfarin Indication: chest pain/ACS  Allergies  Allergen Reactions  . Metoprolol Shortness Of Breath, Diarrhea and Other (See Comments)    Fatigue   . Gemfibrozil Other (See Comments)    Severe fatigue  . Other Other (See Comments)    Numerous cancer drugs - unknown reaction  . Tamoxifen Other (See Comments)    Bone and body aches   . Tetracyclines & Related Other (See Comments)    Vaginal infections    . Toprol Xl [Metoprolol Tartrate] Other (See Comments)    Hair loss  . Verapamil Other (See Comments)    Unknown  . Vicodin [Hydrocodone-Acetaminophen] Nausea And Vomiting and Other (See Comments)    Patient reports terrible nausea; plain tylenol is ok with patient  . Aromasin [Exemestane] Other (See Comments)    Hair loss     Patient Measurements: Height: 5\' 5"  (165.1 cm) Weight: 112.9 kg (249 lb) IBW/kg (Calculated) : 57 kg  Vital Signs: Temp: 98.4 F (36.9 C) (03/05 0311) Temp Source: Oral (03/05 0311) BP: 145/66 (03/05 0311) Pulse Rate: 75 (03/05 0311)  Labs: Recent Labs    10/25/20 0247 10/26/20 0259 10/26/20 0812 10/26/20 0834 10/27/20 0337  HGB  --   --  11.6* 11.2*  11.2*  --   HCT  --   --  34.0* 33.0*  33.0*  --   LABPROT 23.7* 19.0*  --   --  16.7*  INR 2.2* 1.7*  --   --  1.4*  CREATININE 1.81* 1.60*  --   --  1.33*    Estimated Creatinine Clearance: 45.8 mL/min (A) (by C-G formula based on SCr of 1.33 mg/dL (H)).   Medical History: Past Medical History:  Diagnosis Date  . Anemia   . Aortic stenosis    mild by echo 09/2020 with mean AVG 77mmHg  . Breast CA ([place])    left  . Breast cancer ([place])   . Carotid stenosis    1-39% left  . Degenerative disc disease, lumbar    of the knees  . Diabetes mellitus   . Dysrhythmia   . 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[place])   . Fatigue    Severe secondary to to gemfibrozil  . Gastroesophageal reflux disease    denies  . GI bleeding    a. 06/2018: EGD  showed multiple nonbleeding duodenal ulcers. 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  . Hammertoe    left second toe  . Hypercholesteremia   . Hypertension   . Insomnia   . Irritable bowel   . Low back pain   . Metabolic syndrome   . Morbid obesity ([place])   . OSA on CPAP \   bipap  . Osteoarthritis   . Osteopenia   . Persistent atrial fibrillation [facility])    s/p afib ablation - Dr. Rayann Heman 08/2018  . Personal history of radiation therapy   . Pneumonia   . PONV (postoperative nausea and vomiting)   . Sigmoid diverticulosis   . Thalassemia minor   . Vitamin D deficiency     Medications:  Medications Prior to Admission  Medication Sig Dispense Refill Last Dose  . Calcium Carbonate-Vitamin D (CALCIUM 600 + D PO) Take 1 tablet by mouth daily.    10/22/2020 at Unknown time  . celecoxib (CELEBREX) 200 MG capsule Take 200 mg by mouth daily.   10/22/2020 at Unknown time  . Cholecalciferol (VITAMIN D3) 25 MCG (1000 UT)  CAPS Take by mouth.   10/22/2020 at Unknown time  . diltiazem (CARDIZEM CD) 120 MG 24 hr capsule Take 1 capsule (120 mg total) by mouth in the morning and at bedtime. 180 capsule 3 10/22/2020 at Unknown time  . diphenhydramine-acetaminophen (TYLENOL PM) 25-500 MG TABS tablet Take 2 tablets by mouth as needed (sleep).    Past Week at Unknown time  . furosemide (LASIX) 40 MG tablet Take 1 tablet (40 mg total) by mouth daily. 90 tablet 3 Past Week at Unknown time  . glimepiride (AMARYL) 4 MG tablet Take 2 mg by mouth at bedtime.   10/22/2020 at Unknown time  . metFORMIN (GLUCOPHAGE-XR) 500 MG 24 hr tablet Take 500 mg by mouth 2 (two) times daily.   3 10/23/2020 at 0800  . rosuvastatin (CRESTOR) 5 MG tablet Take 5 mg by mouth once a week. Pt states she is taking weekly per Dr. Inda Merlin.   Past Week at Unknown time  . valsartan (DIOVAN) 160 MG tablet 1 tablet     . warfarin (COUMADIN) 4 MG tablet TAKE ON MONDAY, WEDNESDAY AND  FRIDAY AS DIRECTED BY THE COUMADIN CLINIC 45 tablet 1 10/22/2020 at Unknown time  . warfarin (COUMADIN) 5 MG tablet TAKE ON SUNDAY, TUESDAY, THURSDAY, AND SATURDAY OR TAKE AS DIRECTED BY THE COUMADIN CLINIC. 60 tablet 1 Past Week at Unknown time  . diclofenac Sodium (VOLTAREN) 1 % GEL Apply 4 g topically daily. (Patient not taking: Reported on 10/23/2020)   Not Taking at Unknown time  . ONETOUCH ULTRA test strip 1 each by Other route daily.     . ORTHOVISC 30 MG/2ML SOSY 1 syringe intraarticularly once a week (Patient not taking: Reported on 10/23/2020)   Completed Course at Unknown time    Assessment: 75 yo female admitted for dyspnea and chest pain. PTA the patient is on warfarin. Pt is s/p cath, without plans for a heparin bridge. Pharmacy consulted to dose warfarin. PTA warfarin dose 5 mg daily except 4 mg on MWF. Pt also evaluated for apixaban which would be $40 per month for her after a prior authorization but the pt feels she cannot afford apixaban.  INR today is subtherapeutic at 1.4, warfarin restarted 3/4. Pt is without overt bleeding and diet has remained stable. Last Hgb stable at 11.2, platelets 178. No new interacting medications have been started.  Goal of Therapy:  INR 2-3 Monitor platelets by anticoagulation protocol: Yes   Plan:  - Warfarin PO 5 mg x 1 dose - Monitor daily PT/INR and CBC - Monitor for signs and symptoms of bleeding - Follow diet intake and monitor for drug interactions   Shauna Hugh, PharmD, [place]  PGY-1 Pharmacy Resident 10/27/2020 8:44 AM  Please check [email or web address] for unit-specific pharmacy phone numbers.

## 2020-10-27 NOTE — Discharge Instructions (Signed)
PLEASE REMEMBER TO BRING ALL OF YOUR MEDICATIONS TO EACH OF YOUR FOLLOW-UP OFFICE VISITS.  PLEASE ATTEND ALL SCHEDULED FOLLOW-UP APPOINTMENTS.   Activity: Increase activity slowly as tolerated. You may shower, but no soaking baths (or swimming) for 1 week. No driving for 24 hours. No lifting over 5 lbs for 1 week. No sexual activity for 1 week.   You May Return to Work: in 1 week (if applicable)  Wound Care: You may wash cath site gently with soap and water. Keep cath site clean and dry. If you notice pain, swelling, bleeding or pus at your cath site, please call [phone].   You can restart metformin 10/29/20 as previously prescribed

## 2020-10-27 NOTE — Progress Notes (Signed)
Patient had a five beat run of V-tach with a PVC.  Patient states "it just feels like my heart is racing".  No other symptoms.  Cardiac monitoring strip located in the cardiac monitoring in the chart.  Vitals sign within normal limits.  Will continue to monitor.    Donah Driver, RN

## 2020-10-27 NOTE — Progress Notes (Signed)
Progress Note  Patient Name: Jenna Mcdonald Date of Encounter: 10/27/2020  Primary Cardiologist: Fransico Him, MD  Subjective   Had some chest discomfort this morning.  PVCs noted on monitor, no atrial fibrillation.  No shortness of breath.  Inpatient Medications    Scheduled Meds:  calcium-vitamin D  1 tablet Oral Q breakfast   celecoxib  200 mg Oral Daily   diltiazem  120 mg Oral BID   insulin aspart  0-15 Units Subcutaneous TID WC   insulin aspart  0-5 Units Subcutaneous QHS   [START ON 10/28/2020] rosuvastatin  5 mg Oral Weekly   sodium chloride flush  3 mL Intravenous Q12H   warfarin  5 mg Oral ONCE-1600   Warfarin - Pharmacist Dosing Inpatient   Does not apply q1600   Continuous Infusions:  sodium chloride 75 mL/hr at 10/26/20 0823   sodium chloride     PRN Meds: sodium chloride, acetaminophen, ALPRAZolam, alum & mag hydroxide-simeth, diphenhydrAMINE, loperamide, nitroGLYCERIN, ondansetron (ZOFRAN) IV, sodium chloride flush, zolpidem   Vital Signs    Vitals:   10/26/20 0848 10/26/20 0922 10/26/20 1948 10/27/20 0311  BP: 130/73 (!) 148/49 (!) 158/44 (!) 145/66  Pulse: 99 82  75  Resp: 17 18  20   Temp:  (!) 97.4 F (36.3 C) 98 F (36.7 C) 98.4 F (36.9 C)  TempSrc:  Oral Oral Oral  SpO2: 99% 98%  95%  Weight:      Height:        Intake/Output Summary (Last 24 hours) at 10/27/2020 1141 Last data filed at 10/27/2020 0800 Gross per 24 hour  Intake 2673.75 ml  Output --  Net 2673.75 ml   Filed Weights   10/25/20 0523 10/26/20 0206 10/26/20 0500  Weight: 111.7 kg 112.8 kg 112.9 kg    Telemetry    Sinus rhythm with frequent PVCs.  Personally reviewed.  ECG    An ECG dated 10/24/2020 was personally reviewed today and demonstrated:  Sinus rhythm with ventricular bigeminy.  Physical Exam   GEN: No acute distress.   Neck: No JVD. Cardiac: RRR with ectopy, no gallop.  Respiratory: Nonlabored. Clear to auscultation bilaterally. GI:  Soft, nontender, bowel sounds present. MS: No edema; No deformity. Neuro:  Nonfocal. Psych: Alert and oriented x 3. Normal affect.  Labs    Chemistry Recent Labs  Lab 10/24/20 0344 10/25/20 0247 10/26/20 0259 10/26/20 0812 10/26/20 0834 10/27/20 0337  NA 139 138 136 140 142   142 136  K 4.7 5.1 4.8 4.7 4.6   4.6 4.8  CL 107 106 106  --   --  105  CO2 21* 21* 21*  --   --  22  GLUCOSE 134* 154* 162*  --   --  147*  BUN 32* 47* 45*  --   --  30*  CREATININE 1.46* 1.81* 1.60*  --   --  1.33*  CALCIUM 9.5 9.0 8.8*  --   --  9.0  PROT 6.7  --   --   --   --   --   ALBUMIN 3.9  --   --   --   --   --   AST 17  --   --   --   --   --   ALT 18  --   --   --   --   --   ALKPHOS 84  --   --   --   --   --  BILITOT 1.1  --   --   --   --   --   GFRNONAA 37* 29* 33*  --   --  42*  ANIONGAP 11 11 9   --   --  9     Hematology Recent Labs  Lab 10/23/20 1329 10/23/20 1714 10/26/20 0812 10/26/20 0834  WBC 8.7 7.8  --   --   RBC 6.31* 5.84*  --   --   HGB 11.7 11.1* 11.6* 11.2*   11.2*  HCT 40.3 36.9 34.0* 33.0*   33.0*  MCV 64* 63.2*  --   --   MCH 18.5* 19.0*  --   --   MCHC 29.0* 30.1  --   --   RDW 18.5* 18.4*  --   --   PLT 229 178  --   --     Cardiac Enzymes Recent Labs  Lab 10/12/20 1828 10/23/20 1714 10/23/20 1914 10/23/20 2135 10/24/20 0344  TROPONINIHS 12 12 13 13 13     BNP Recent Labs  Lab 10/23/20 1329 10/23/20 2135  BNP  --  192.1*  PROBNP 558  --      Radiology    CARDIAC CATHETERIZATION  Result Date: 10/26/2020  Hemodynamic findings consistent with mild pulmonary hypertension.  Angiographically normal coronary arteries  SUMMARY  Angiographically Normal Coronary Arteries  Mildly elevated RHC pressures with a PA mean of 25 million mercury, PCWP of 24 mmHg and LVEDP of 20 mmHg.  High V wave Pressure on PCWP - consider valvular disease  Cardiac Output 6.6, Cardiac Index 3.04 by Fick (LV gram not performed) RECOMMENDATIONS  Considered  nonischemic etiology of her symptoms including potentially A. fib related HFpEF, also cannot exclude MINOCA (microvascular disease)  Okay to restart warfarin this evening; if bridging with IV heparin recommended, would start 8 hours after sheath removal. [name], MD   Cardiac Studies   Echocardiogram 10/09/2020: 1. Left ventricular ejection fraction, by estimation, is 70 to 75%. The  left ventricle has hyperdynamic function. The left ventricle has no  regional wall motion abnormalities. There is mild left ventricular  hypertrophy. Left ventricular diastolic  parameters are consistent with Grade II diastolic dysfunction  (pseudonormalization).  2. Right ventricular systolic function is normal. The right ventricular  size is normal. There is normal pulmonary artery systolic pressure.  3. Left atrial size was moderately dilated.  4. The mitral valve is normal in structure. Trivial mitral valve  regurgitation. No evidence of mitral stenosis.  5. The aortic valve is grossly normal. Aortic valve regurgitation is not  visualized. Mild aortic valve stenosis.   Cardiac catheterization 10/26/2020:  Hemodynamic findings consistent with mild pulmonary hypertension.  Angiographically normal coronary arteries   SUMMARY  Angiographically Normal Coronary Arteries  Mildly elevated RHC pressures with a PA mean of 25 million mercury, PCWP of 24 mmHg and LVEDP of 20 mmHg. ? High V wave Pressure on PCWP - consider valvular disease  Cardiac Output 6.6, Cardiac Index 3.04 by Fick (LV gram not performed)  RECOMMENDATIONS  Considered nonischemic etiology of her symptoms including potentially A. fib related HFpEF, also cannot exclude MINOCA (microvascular disease)  Okay to restart warfarin this evening; if bridging with IV heparin recommended, would start 8 hours after sheath removal.  Patient Profile     75 y.o. female with past medical history of obstructive sleep apnea, hypertension,  paroxysmal atrial fibrillation status post ablation, mild aortic stenosis, obesity, recent negative nuclear study admitted with dyspnea and chest pain.  Previous  plan had been right and left cardiac catheterization.  Echocardiogram February 2022 showed normal LV function, mild left ventricular hypertrophy, grade 2 diastolic dysfunction, moderate left atrial enlargement.  There was mild aortic stenosis with mean gradient 11 mmHg.  Assessment & Plan    1.  History of recurrent dyspnea on exertion and chest pain.  Cardiac catheterization from March 4 revealed normal coronary arteries, mildly elevated PA pressure with mean 25 mmHg, also elevated wedge pressure and LVEDP.  Echocardiogram done in February revealed vigorous LVEF with moderate diastolic dysfunction, mildly dilated left atrium, no significant valvular disease beyond mild aortic stenosis.  Possibility of microvascular angina is to be considered.  She also has frequent PVCs that look to be coming from the outflow tract (upright in the inferior leads) with NSVT by recent outpatient monitor.  This could also be contributing to symptoms.  Increased pulmonary pressures likely related to OSA and obesity.  2.  History of paroxysmal atrial fibrillation with previous ablation, remains in sinus rhythm.  CHA2DS2-VASc score is 5.  She is on Coumadin for stroke prophylaxis.  3.  Mild aortic stenosis, unlikely to be causing active symptoms.  4.  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  Also NSVT by recent outpatient monitor.  Could also be contributing to her current symptoms.  She is on Cardizem CD with additional history of PAF.  5.  CKD stage IIIb, creatinine down to 1.33.  Chart reviewed with plan for discharge home today per Dr. Stanford Breed.  Renal function has improved post angiography.  Plan to discharge on Lasix 40 mg daily, continue Coumadin with recheck INR next week and then follow-up with Dr. Radford Pax  in the next few weeks as already scheduled.  Continue Cardizem CD 120 mg twice daily for now, may need to consider switching to beta-blocker or even referral for EP consultation with 22% PVC burden by cardiac monitor may need to be considered for PVC ablation.  Signed, Rozann Lesches, MD  10/27/2020, 11:41 AM

## 2020-10-27 NOTE — Discharge Summary (Signed)
Discharge Summary    Patient ID: Jenna Mcdonald MRN: [id number]; DOB: June 14, 1946  Admit date: 10/23/2020 Discharge date: 10/27/2020  PCP:  Josetta Huddle, [facility]  Cardiologist:  Fransico Him, MD  Advanced Practice Provider:  No care team member to display Electrophysiologist:  Thompson Grayer, MD   Discharge Diagnoses    Active Problems:   Unstable angina [facility])   Shortness of breath    Diagnostic Studies/Procedures    Echocardiogram 10/09/2020: 1. Left ventricular ejection fraction, by estimation, is 70 to 75%. The  left ventricle has hyperdynamic function. The left ventricle has no  regional wall motion abnormalities. There is mild left ventricular  hypertrophy. Left ventricular diastolic  parameters are consistent with Grade II diastolic dysfunction  (pseudonormalization).  2. Right ventricular systolic function is normal. The right ventricular  size is normal. There is normal pulmonary artery systolic pressure.  3. Left atrial size was moderately dilated.  4. The mitral valve is normal in structure. Trivial mitral valve  regurgitation. No evidence of mitral stenosis.  5. The aortic valve is grossly normal. Aortic valve regurgitation is not  visualized. Mild aortic valve stenosis.   Cardiac catheterization 10/26/2020:  Hemodynamic findings consistent with mild pulmonary hypertension.  Angiographically normal coronary arteries  SUMMARY  Angiographically Normal Coronary Arteries  Mildly elevated RHC pressures with a PA mean of 25 million mercury, PCWP of 24 mmHg and LVEDP of 20 mmHg. ? High V wave Pressure on PCWP - consider valvular disease  Cardiac Output 6.6, Cardiac Index 3.04 by Fick (LV gram not performed)  RECOMMENDATIONS  Considered nonischemic etiology of her symptoms including potentially A. fib related HFpEF, also cannot exclude MINOCA (microvascular disease)  Okay to restart warfarin this evening; if  bridging with IV heparin recommended, would start 8 hours after sheath removal.  _____________   History of Present Illness     Wen Munford is a 75 y.o. female with past medical history of obstructive sleep apnea, hypertension, paroxysmal atrial fibrillation status post ablation, mild aortic stenosis, obesity, recent negative nuclear study admitted with dyspnea and chest pain. She was seen outpatient in early February at which point she had lexiscan without ischemia and echo with diastolic dysfunction and mild AS.  She had subseqeunt presentation with similar complaints of chest pain and DOE with minimal exertion on 10/12/20 and was sent home with lasix. She was seen outpatient by Dr. Radford Pax 10/23/20 with similar complaints and HsTrop obtained at that time, in addition to arranging a [facility] for 3/4. She reported HsTrop was elevated and she was instructed to present to the ED.  She also endorses some orthopnea and PND though she was lying flat comfortably on my evaluation.  Review of symptoms was otherwise normal.  She is asymptomatic from palpitations though she had recent ziopatch with frequent PVCs.  Confirms complete medication adherence.    Hospital Course     Consultants: None   1. Acute diastolic CHF: patient presented with recurrent dyspnea on exertion and chest pain. HsTrop's negative this admission and EKG non-ischemic. She underwent [facility] 10/26/20 which revealed normal coronary arteries with elevated right heart pressures. Her elevated PA pressures were felt to be in part due to OSA and obesity. Additionally her frequent PVCs was felt to be contributing to her volume overload. She was started on po lasix 40mg  daily at discharge. - Continue lasix 40mg  daily  2. Paroxysmal atrial fibrillation s/p ablation: maintaining sinus rhythm this admission. Coumadin resumed at discharge -  Continue coumadin as previously prescribed - Continue diltiazem for rate control  3. HTN: BP  intermittently above goal. Home valsartan held in anticipation of heart cath. Cr stable at 1.3 on the day of discharge - Resume home valsartan at discharge - Continue diltiazem  4. Frequent PVCs: recent outpatient monitor with 22% burden of PVCs. Felt this may be contributing to #1.  - Patient to follow-up with Dr. Rayann Heman 11/12/20  - Continue diltiazem  5. CKD stage 3: Cr peaked at 1.8 this admission, down to 1.3 on the day of discharge  - Will plan to repeat BMET in 1 week for close monitoring  Did the patient have an acute coronary syndrome (MI, NSTEMI, STEMI, etc) this admission?:  No                               Did the patient have a percutaneous coronary intervention (stent / angioplasty)?:  No.       _____________  Discharge Vitals Blood pressure (!) 145/66, pulse 75, temperature 98.4 F (36.9 C), temperature source Oral, resp. rate 20, height 5\' 5"  (1.651 m), weight 112.9 kg, SpO2 95 %.  Filed Weights   10/25/20 0523 10/26/20 0206 10/26/20 0500  Weight: 111.7 kg 112.8 kg 112.9 kg    Labs & Radiologic Studies    CBC Recent Labs    10/26/20 0812 10/26/20 0834  HGB 11.6* 11.2*  11.2*  HCT 34.0* 33.0*  27.0*   Basic Metabolic Panel Recent Labs    10/26/20 0259 10/26/20 0812 10/26/20 0834 10/27/20 0337  NA 136   < > 142  142 136  K 4.8   < > 4.6  4.6 4.8  CL 106  --   --  105  CO2 21*  --   --  22  GLUCOSE 162*  --   --  147*  BUN 45*  --   --  30*  CREATININE 1.60*  --   --  1.33*  CALCIUM 8.8*  --   --  9.0   < > = values in this interval not displayed.   Liver Function Tests No results for input(s): AST, ALT, ALKPHOS, BILITOT, PROT, ALBUMIN in the last 72 hours. No results for input(s): LIPASE, AMYLASE in the last 72 hours. High Sensitivity Troponin:   Recent Labs  Lab 10/12/20 1828 10/23/20 1714 10/23/20 1914 10/23/20 2135 10/24/20 0344  TROPONINIHS 12 12 13 13 13     BNP Invalid input(s): POCBNP D-Dimer No results for input(s): DDIMER in  the last 72 hours. Hemoglobin A1C No results for input(s): HGBA1C in the last 72 hours. Fasting Lipid Panel No results for input(s): CHOL, HDL, LDLCALC, TRIG, CHOLHDL, LDLDIRECT in the last 72 hours. Thyroid Function Tests No results for input(s): TSH, T4TOTAL, T3FREE, THYROIDAB in the last 72 hours.  Invalid input(s): FREET3 _____________  DG Chest 2 View  Result Date: 10/23/2020 CLINICAL DATA:  Chest pain EXAM: CHEST - 2 VIEW COMPARISON:  10/12/2020, 07/21/2018 FINDINGS: No focal opacity or pleural effusion. Mild diffuse interstitial opacity some of which is felt chronic. Normal cardiomediastinal silhouette. No pneumothorax. IMPRESSION: No active cardiopulmonary disease. Electronically Signed   By: Donavan Foil M.D.   On: 10/23/2020 17:39   DG Chest 2 View  Result Date: 10/12/2020 CLINICAL DATA:  Short of breath, chest heaviness for 1 month EXAM: CHEST - 2 VIEW COMPARISON:  07/21/2018 FINDINGS: Frontal and lateral views of the chest demonstrate a stable  cardiac silhouette. Mild increased central vascular congestion with diffuse interstitial prominence, favor mild volume overload. No acute airspace disease, effusion, or pneumothorax. No acute bony abnormalities. IMPRESSION: 1. Findings consistent with mild interstitial edema. Electronically Signed   By: Randa Ngo M.D.   On: 10/12/2020 16:33   CARDIAC CATHETERIZATION  Result Date: 10/26/2020  Hemodynamic findings consistent with mild pulmonary hypertension.  Angiographically normal coronary arteries  SUMMARY  Angiographically Normal Coronary Arteries  Mildly elevated RHC pressures with a PA mean of 25 million mercury, PCWP of 24 mmHg and LVEDP of 20 mmHg.  High V wave Pressure on PCWP - consider valvular disease  Cardiac Output 6.6, Cardiac Index 3.04 by Fick (LV gram not performed) RECOMMENDATIONS  Considered nonischemic etiology of her symptoms including potentially A. fib related HFpEF, also cannot exclude MINOCA (microvascular  disease)  Okay to restart warfarin this evening; if bridging with IV heparin recommended, would start 8 hours after sheath removal. [name], MD  MYOCARDIAL PERFUSION IMAGING  Result Date: 10/04/2020  The left ventricular ejection fraction is moderately decreased (30-44%).  Nuclear stress EF: 38%.  There was no ST segment deviation noted during stress.  The study is normal.  This is a low risk study.  Normal pharmacologic nuclear stress test with no evidence for prior infarct or ischemia. Very frequent PACs and short runs of atrial tachycardia. LVEF appears underestimated, correlation with an echocardiogram is recommended.  ECHOCARDIOGRAM COMPLETE  Result Date: 10/09/2020    ECHOCARDIOGRAM REPORT   Patient Name:   MARJORY MEINTS Date of Exam: 10/09/2020 Medical Rec #:  [id number]          Height:       65.0 in Accession #:    [id number]         Weight:       255.0 lb Date of Birth:  12-23-1945           BSA:          2.193 m Patient Age:    36 years           BP:           140/54 mmHg Patient Gender: F                  HR:           87 bpm. Exam Location:  [address] Procedure: 2D Echo, Cardiac Doppler and Color Doppler Indications:     I35.0 Aortic Stenosis  History:         Patient has prior history of Echocardiogram examinations, most                  recent 09/21/2018. CAD, Aortic Valve Disease, Arrythmias:Atrial                  Fibrillation, Signs/Symptoms:Shortness of Breath, Dyspnea and                  Chest Pain; Risk Factors:Hypertension, Diabetes, Dyslipidemia,                  Former Smoker and Sleep Apnea. Left Breast Cancer status post                  Radiation and Lumpectomy, Obesity, Mild Aortic Stenosis.  Sonographer:     Deliah Boston RDCS Referring Phys:  [id number] TRACI R TURNER Diagnosing Phys: Mertie Moores MD  Sonographer Comments: Patient arranged to see A-fib clinic tomorrow for new onset A-fib. (spoke  with Dr. Radford Pax while patient in office) IMPRESSIONS  1. Left  ventricular ejection fraction, by estimation, is 70 to 75%. The left ventricle has hyperdynamic function. The left ventricle has no regional wall motion abnormalities. There is mild left ventricular hypertrophy. Left ventricular diastolic parameters are consistent with Grade II diastolic dysfunction (pseudonormalization).  2. Right ventricular systolic function is normal. The right ventricular size is normal. There is normal pulmonary artery systolic pressure.  3. Left atrial size was moderately dilated.  4. The mitral valve is normal in structure. Trivial mitral valve regurgitation. No evidence of mitral stenosis.  5. The aortic valve is grossly normal. Aortic valve regurgitation is not visualized. Mild aortic valve stenosis. FINDINGS  Left Ventricle: Left ventricular ejection fraction, by estimation, is 70 to 75%. The left ventricle has hyperdynamic function. The left ventricle has no regional wall motion abnormalities. The left ventricular internal cavity size was normal in size. There is mild left ventricular hypertrophy. Left ventricular diastolic parameters are consistent with Grade II diastolic dysfunction (pseudonormalization). Right Ventricle: The right ventricular size is normal. No increase in right ventricular wall thickness. Right ventricular systolic function is normal. There is normal pulmonary artery systolic pressure. The tricuspid regurgitant velocity is 2.84 m/s, and  with an assumed right atrial pressure of 3 mmHg, the estimated right ventricular systolic pressure is 78.9 mmHg. Left Atrium: Left atrial size was moderately dilated. Right Atrium: Right atrial size was normal in size. Pericardium: There is no evidence of pericardial effusion. Mitral Valve: The mitral valve is normal in structure. Trivial mitral valve regurgitation. No evidence of mitral valve stenosis. Tricuspid Valve: The tricuspid valve is grossly normal. Tricuspid valve regurgitation is trivial. Aortic Valve: The aortic valve is  grossly normal. Aortic valve regurgitation is not visualized. Mild aortic stenosis is present. Aortic valve mean gradient measures 11.3 mmHg. Aortic valve peak gradient measures 19.0 mmHg. Aortic valve area, by VTI measures 3.28 cm. Pulmonic Valve: The pulmonic valve was normal in structure. Pulmonic valve regurgitation is not visualized. Aorta: The aortic root and ascending aorta are structurally normal, with no evidence of dilitation. IAS/Shunts: The atrial septum is grossly normal.  LEFT VENTRICLE PLAX 2D LVIDd:         4.50 cm  Diastology LVIDs:         1.80 cm  LV e' medial:    9.63 cm/s LV PW:         1.00 cm  LV E/e' medial:  13.5 LV IVS:        1.30 cm  LV e' lateral:   7.83 cm/s LVOT diam:     2.60 cm  LV E/e' lateral: 16.6 LV SV:         131 LV SV Index:   60 LVOT Area:     5.31 cm  RIGHT VENTRICLE RV S prime:     19.70 cm/s TAPSE (M-mode): 3.1 cm LEFT ATRIUM             Index       RIGHT ATRIUM           Index LA diam:        4.20 cm 1.91 cm/m  RA Area:     14.70 cm LA Vol (A2C):   96.1 ml 43.82 ml/m RA Volume:   39.90 ml  18.19 ml/m LA Vol (A4C):   93.8 ml 42.77 ml/m LA Biplane Vol: 94.9 ml 43.27 ml/m  AORTIC VALVE AV Area (Vmax):    2.59 cm AV Area (Vmean):  2.55 cm AV Area (VTI):     3.28 cm AV Vmax:           218.00 cm/s AV Vmean:          145.867 cm/s AV VTI:            0.400 m AV Peak Grad:      19.0 mmHg AV Mean Grad:      11.3 mmHg LVOT Vmax:         106.50 cm/s LVOT Vmean:        70.100 cm/s LVOT VTI:          0.247 m LVOT/AV VTI ratio: 0.62  AORTA Ao Root diam: 3.00 cm Ao Asc diam:  3.30 cm MITRAL VALVE                TRICUSPID VALVE MV Area (PHT): cm          TR Peak grad:   32.3 mmHg MV Decel Time: 201 msec     TR Vmax:        284.00 cm/s MV E velocity: 129.67 cm/s MV A velocity: 89.83 cm/s   SHUNTS MV E/A ratio:  1.44         Systemic VTI:  0.25 m                             Systemic Diam: 2.60 cm Mertie Moores MD Electronically signed by Mertie Moores MD Signature Date/Time:  10/09/2020/1:59:59 PM    Final (Updated)    Disposition   Pt is being discharged home today in good condition.  Follow-up Plans & Appointments     Follow-up Information    Sueanne Margarita, MD Follow up on 11/15/2020.   Specialty: Cardiology Why: Please arrive 15 minutes early for your 3:40pm post-hospital cardiology appointment.  Contact information: [address] [place] [address] [phone]        Thompson Grayer, MD Follow up on 11/12/2020.   Specialty: Cardiology Why: Please arrive 15 minutes early for your 9:30am post-hospital cardiology appointment Contact information: [place] [address] [phone]        [place] [facility] Follow up on 11/02/2020.   Specialty: Cardiology Why: Please present for blood work at your convenience on 11/02/20 for close monitoring of your kidney function Contact information: [address], [place] [phone]               Discharge Medications   Allergies as of 10/27/2020      Reactions   Metoprolol Shortness Of Breath, Diarrhea, Other (See Comments)   Fatigue   Gemfibrozil Other (See Comments)   Severe fatigue   Other Other (See Comments)   Numerous cancer drugs - unknown reaction   Tamoxifen Other (See Comments)   Bone and body aches   Tetracyclines & Related Other (See Comments)   Vaginal infections    Toprol Xl [metoprolol Tartrate] Other (See Comments)   Hair loss   Verapamil Other (See Comments)   Unknown   Vicodin [hydrocodone-acetaminophen] Nausea And Vomiting, Other (See Comments)   Patient reports terrible nausea; plain tylenol is ok with patient   Aromasin [exemestane] Other (See Comments)   Hair loss      Medication List    TAKE these medications   CALCIUM 600 + D PO Take 1 tablet by mouth daily.   celecoxib  200 MG capsule Commonly known as: CELEBREX Take 200 mg by mouth daily.   diclofenac  Sodium 1 % Gel Commonly known as: VOLTAREN Apply 4 g topically daily.   diltiazem 120 MG 24 hr capsule Commonly known as: CARDIZEM CD Take 1 capsule (120 mg total) by mouth in the morning and at bedtime.   diphenhydramine-acetaminophen 25-500 MG Tabs tablet Commonly known as: TYLENOL PM Take 2 tablets by mouth as needed (sleep).   furosemide 40 MG tablet Commonly known as: LASIX Take 1 tablet (40 mg total) by mouth daily.   glimepiride 4 MG tablet Commonly known as: AMARYL Take 2 mg by mouth at bedtime.   metFORMIN 500 MG 24 hr tablet Commonly known as: GLUCOPHAGE-XR Take 500 mg by mouth 2 (two) times daily.   OneTouch Ultra test strip Generic drug: glucose blood 1 each by Other route daily.   OrthoVisc 30 MG/2ML Sosy Generic drug: Hyaluronan 1 syringe intraarticularly once a week   rosuvastatin 5 MG tablet Commonly known as: CRESTOR Take 5 mg by mouth once a week. Pt states she is taking weekly per Dr. Inda Merlin.   valsartan 160 MG tablet Commonly known as: DIOVAN 1 tablet   Vitamin D3 25 MCG (1000 UT) Caps Take by mouth.   warfarin 5 MG tablet Commonly known as: COUMADIN Take as directed. If you are unsure how to take this medication, talk to your nurse or doctor. Original instructions: TAKE ON SUNDAY, TUESDAY, THURSDAY, AND SATURDAY OR TAKE AS DIRECTED BY THE COUMADIN CLINIC.   warfarin 4 MG tablet Commonly known as: COUMADIN Take as directed. If you are unsure how to take this medication, talk to your nurse or doctor. Original instructions: TAKE ON MONDAY, WEDNESDAY AND FRIDAY AS DIRECTED BY THE COUMADIN CLINIC          Outstanding Labs/Studies   BMET 11/02/20  Duration of Discharge Encounter   Greater than 30 minutes including physician time.  Signed, Abigail Butts, PA-C 10/27/2020, 2:10 PM

## 2020-10-29 ENCOUNTER — Telehealth: Payer: Self-pay

## 2020-10-29 DIAGNOSIS — I493 Ventricular premature depolarization: Secondary | ICD-10-CM

## 2020-10-29 NOTE — Telephone Encounter (Signed)
The patient has been notified of the result and verbalized understanding.  All questions (if any) were answered. Cardiac MRI has been ordered. Patient sees Dr. Rayann Heman on 3/22. Patient advised to keep follow up appointment and continue Cardizem.  Patient also states that she has been taking Lasix 20 mg only once per day instead of twice. She denies any increased SOB, weight gain or swelling.  Does report that she has had some lightheadedness recently.

## 2020-10-29 NOTE — Telephone Encounter (Signed)
-----   Message from Sueanne Margarita, MD sent at 10/29/2020  9:12 AM EST ----- Event monitor shows elevated PVC load at > 20% and likely etiology of patient's symptoms.  Please get a cardiac MRI with gad for PVCs.  Refer to Dr. Quentin Ore for EP evaluation.  Her cath showed no obstructive CAD>  She is intolerant to BB and will continue for now on Cardizem

## 2020-10-30 ENCOUNTER — Telehealth: Payer: Self-pay | Admitting: Cardiology

## 2020-10-30 NOTE — Telephone Encounter (Signed)
Spoke with patient regarding preferred weekdays and times for scheduling the Cardiac MRI ordered by Dr. Radford Pax.  Informed patient as soon as we hear regarding the insurance prior authorization--I will be in touch with the appointment information.  Patient voiced her understanding.

## 2020-10-31 ENCOUNTER — Encounter: Payer: Self-pay | Admitting: Cardiology

## 2020-10-31 DIAGNOSIS — M533 Sacrococcygeal disorders, not elsewhere classified: Secondary | ICD-10-CM | POA: Diagnosis not present

## 2020-10-31 DIAGNOSIS — R030 Elevated blood-pressure reading, without diagnosis of hypertension: Secondary | ICD-10-CM | POA: Diagnosis not present

## 2020-10-31 NOTE — Telephone Encounter (Signed)
Spoke with patient regarding the Tuesday 11/20/20 2:00pm Cardiac MRI appointment at [facility]---arrival time is 1:30pm--1st floor admissions office.  Will mail information to patient and it is available in My Chart.  Patient voiced her understanding.

## 2020-11-02 ENCOUNTER — Other Ambulatory Visit: Payer: Self-pay

## 2020-11-02 ENCOUNTER — Other Ambulatory Visit: Payer: Medicare PPO | Admitting: *Deleted

## 2020-11-02 ENCOUNTER — Ambulatory Visit (INDEPENDENT_AMBULATORY_CARE_PROVIDER_SITE_OTHER): Payer: Medicare PPO

## 2020-11-02 DIAGNOSIS — I4891 Unspecified atrial fibrillation: Secondary | ICD-10-CM | POA: Diagnosis not present

## 2020-11-02 DIAGNOSIS — Z5181 Encounter for therapeutic drug level monitoring: Secondary | ICD-10-CM | POA: Diagnosis not present

## 2020-11-02 DIAGNOSIS — N1832 Chronic kidney disease, stage 3b: Secondary | ICD-10-CM

## 2020-11-02 LAB — BASIC METABOLIC PANEL
Anion gap: 9 (ref 5–15)
BUN: 33 mg/dL — ABNORMAL HIGH (ref 8–23)
CO2: 25 mmol/L (ref 22–32)
Calcium: 10.1 mg/dL (ref 8.9–10.3)
Chloride: 104 mmol/L (ref 98–111)
Creatinine, Ser: 1.66 mg/dL — ABNORMAL HIGH (ref 0.44–1.00)
GFR, Estimated: 32 mL/min — ABNORMAL LOW (ref >60)
Glucose, Bld: 186 mg/dL — ABNORMAL HIGH (ref 70–99)
Potassium: 5 mmol/L (ref 3.5–5.1)
Sodium: 138 mmol/L (ref 135–145)

## 2020-11-02 LAB — POCT INR: INR: 1.5 — AB (ref 2.0–3.0)

## 2020-11-02 NOTE — Patient Instructions (Signed)
Description   Take 6mg  today and tomorrow, then resume same dosage 5mg  daily, except for the 4mg  on Mondays, Wednesdays and Fridays. Recheck INR in 1 week. [facility] [phone] Main [phone]

## 2020-11-06 ENCOUNTER — Telehealth: Payer: Self-pay | Admitting: Cardiology

## 2020-11-06 NOTE — Telephone Encounter (Signed)
Patient called to see if we received the paperwork she sent over Friday. The papers need to be signed by Heron Nay. She just wanted to verify that Dr. Radford Pax received it

## 2020-11-06 NOTE — Telephone Encounter (Signed)
Left message for patient letting her know that I was advised by one of my colleagues that the paperwork had been put in Dr. Theodosia Blender mailbox. Dr. Radford Pax is working in the hospital this week so she will not be able to sign it until next week. Advised to call back with any questions or concerns.

## 2020-11-07 NOTE — Telephone Encounter (Signed)
Left message for patient to call back in regards to paperwork.  Received letter that she dropped off from [facility] that denied coverage from her hospital stay. Letter in the mail was received from [facility] stating that the patient's hospital stay has been covered.  Asked patient to call back and let us know if Humans paperwork still needs to be completed.

## 2020-11-12 ENCOUNTER — Encounter: Payer: Self-pay | Admitting: Internal Medicine

## 2020-11-12 ENCOUNTER — Other Ambulatory Visit: Payer: Self-pay

## 2020-11-12 ENCOUNTER — Ambulatory Visit (INDEPENDENT_AMBULATORY_CARE_PROVIDER_SITE_OTHER): Payer: Medicare PPO | Admitting: Internal Medicine

## 2020-11-12 ENCOUNTER — Ambulatory Visit (INDEPENDENT_AMBULATORY_CARE_PROVIDER_SITE_OTHER): Payer: Medicare PPO | Admitting: *Deleted

## 2020-11-12 VITALS — BP 124/56 | HR 82 | Ht 65.0 in | Wt 252.2 lb

## 2020-11-12 DIAGNOSIS — Z5181 Encounter for therapeutic drug level monitoring: Secondary | ICD-10-CM

## 2020-11-12 DIAGNOSIS — I10 Essential (primary) hypertension: Secondary | ICD-10-CM | POA: Diagnosis not present

## 2020-11-12 DIAGNOSIS — I4891 Unspecified atrial fibrillation: Secondary | ICD-10-CM | POA: Diagnosis not present

## 2020-11-12 DIAGNOSIS — E7800 Pure hypercholesterolemia, unspecified: Secondary | ICD-10-CM

## 2020-11-12 DIAGNOSIS — G4733 Obstructive sleep apnea (adult) (pediatric): Secondary | ICD-10-CM

## 2020-11-12 DIAGNOSIS — I4819 Other persistent atrial fibrillation: Secondary | ICD-10-CM

## 2020-11-12 LAB — POCT INR: INR: 2.9 (ref 2.0–3.0)

## 2020-11-12 MED ORDER — DILTIAZEM HCL ER COATED BEADS 120 MG PO CP24: 120.0000 mg | ORAL_CAPSULE | Freq: Every day | ORAL | 3 refills | Status: DC

## 2020-11-12 NOTE — Patient Instructions (Addendum)
Medication Instructions:  Reduce your diltiazem to 120 mg daily  Stop Celebrex  Your physician recommends that you continue on your current medications as directed. Please refer to the Current Medication list given to you today.  Labwork: None ordered.  Testing/Procedures: None ordered.  Follow-Up: Your physician wants you to follow-up in: 6 months with     Tommye Standard, PA-C     You will receive a reminder letter in the mail two months in advance. If you don't receive a letter, please call our office to schedule the follow-up appointment.  Any Other Special Instructions Will Be Listed Below (If Applicable).  If you need a refill on your cardiac medications before your next appointment, please call your pharmacy.

## 2020-11-12 NOTE — Progress Notes (Signed)
PCP: Josetta Huddle, MD Primary Cardiologist: Dr Radford Pax Primary EP: Dr Rayann Heman  Jenna Mcdonald is a 75 y.o. female who presents today for routine electrophysiology followup.  Since last being seen in our clinic, the patient reports doing reasonably well.  AF is controlled.  She is not very active.  + SOB.  She has had diastolic dysfunction.  + PVCs.  Today, she denies symptoms of chest pain,  lower extremity edema, dizziness, presyncope, or syncope.  The patient is otherwise without complaint today.   Past Medical History:  Diagnosis Date  . Anemia   . Aortic stenosis    mild by echo 09/2020 with mean AVG 68mmHg  . Breast CA ([place])    left  . Breast cancer ([place])   . Carotid stenosis    1-39% left  . Degenerative disc disease, lumbar    of the knees  . Diabetes mellitus   . Dysrhythmia   . 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[place])   . Fatigue    Severe secondary to to gemfibrozil  . Gastroesophageal reflux disease    denies  . GI bleeding    a. 06/2018: EGD showed multiple nonbleeding duodenal ulcers. 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  . Hammertoe    left second toe  . Hypercholesteremia   . Hypertension   . Insomnia   . Irritable bowel   . Low back pain   . Metabolic syndrome   . Morbid obesity ([place])   . OSA on CPAP \   bipap  . Osteoarthritis   . Osteopenia   . Persistent atrial fibrillation [facility])    s/p afib ablation - Dr. Rayann Heman 08/2018  . Personal history of radiation therapy   . Pneumonia   . PONV (postoperative nausea and vomiting)   . Sigmoid diverticulosis   . Thalassemia minor   . Vitamin D deficiency    Past Surgical History:  Procedure Laterality Date  . ATRIAL FIBRILLATION ABLATION N/A 09/21/2018   Procedure: ATRIAL FIBRILLATION ABLATION;  Surgeon: Thompson Grayer, MD;  Location: [place] [facility];  Service: Cardiovascular;  Laterality: N/A;  . BIOPSY  07/06/2018   Procedure: BIOPSY;   Surgeon: Clarene Essex, MD;  Location: [place];  Service: Endoscopy;;  . BREAST LUMPECTOMY Left 05/28/2009  . BREAST SURGERY Left 10   lumpectomy  . CARDIOVERSION N/A 07/07/2018   Procedure: CARDIOVERSION;  Surgeon: [name], MD;  Location: [facility] ENDOSCOPY;  Service: Cardiovascular;  Laterality: N/A;  . CARDIOVERSION N/A 07/14/2018   Procedure: CARDIOVERSION;  Surgeon: Larey Dresser, MD;  Location: [facility] ENDOSCOPY;  Service: Cardiovascular;  Laterality: N/A;  . CESAREAN SECTION    . COLONOSCOPY WITH PROPOFOL N/A 07/06/2018   Procedure: COLONOSCOPY WITH PROPOFOL;  Surgeon: Clarene Essex, MD;  Location: [place];  Service: Endoscopy;  Laterality: N/A;  . CYSTECTOMY  70   pilonidal   . DILATION AND CURETTAGE OF UTERUS    . ESOPHAGOGASTRODUODENOSCOPY (EGD) WITH PROPOFOL N/A 07/06/2018   Procedure: ESOPHAGOGASTRODUODENOSCOPY (EGD) WITH PROPOFOL;  Surgeon: Clarene Essex, MD;  Location: [place];  Service: Endoscopy;  Laterality: N/A;  . EYE SURGERY Bilateral 12/14   cataracts  . HOT HEMOSTASIS N/A 07/06/2018   Procedure: HOT HEMOSTASIS (ARGON PLASMA COAGULATION/BICAP);  Surgeon: Clarene Essex, MD;  Location: [place];  Service: Endoscopy;  Laterality: N/A;  . MAXIMUM ACCESS (MAS)POSTERIOR LUMBAR INTERBODY FUSION (PLIF) 2 LEVEL N/A 09/22/2013   Procedure: FOR MAXIMUM ACCESS  POSTERIOR LUMBAR INTERBODY FUSION LUMBAR THREE-FOUR FOUR-FIVE;  Surgeon: Eustace Moore, MD;  Location: [facility] NEURO ORS;  Service: Neurosurgery;  Laterality: N/A;  . OOPHORECTOMY     wedge section not rem  . 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 REPLACEMENT TOTAL KNEE    . RIGHT/LEFT HEART CATH AND CORONARY ANGIOGRAPHY N/A 10/26/2020   Procedure: RIGHT/LEFT HEART CATH AND CORONARY ANGIOGRAPHY;  Surgeon: Leonie Man, MD;  Location: [place] [facility];  Service: Cardiovascular;  Laterality: N/A;  . TEE WITHOUT CARDIOVERSION N/A 07/07/2018   Procedure: TRANSESOPHAGEAL ECHOCARDIOGRAM (TEE);  Surgeon: [name],  MD;  Location: [place];  Service: Cardiovascular;  Laterality: N/A;  . TEE WITHOUT CARDIOVERSION N/A 09/21/2018   Procedure: TRANSESOPHAGEAL ECHOCARDIOGRAM (TEE);  Surgeon: Acie Fredrickson Wonda Cheng, MD;  Location: [facility] ENDOSCOPY;  Service: Cardiovascular;  Laterality: N/A;  . TONSILLECTOMY    . TUBAL LIGATION      ROS- all systems are reviewed and negatives except as per HPI above  Current Outpatient Medications  Medication Sig Dispense Refill  . Calcium Carbonate-Vitamin D (CALCIUM 600 + D PO) Take 1 tablet by mouth daily.     . celecoxib (CELEBREX) 200 MG capsule Take 200 mg by mouth daily.    . Cholecalciferol (VITAMIN D3) 25 MCG (1000 UT) CAPS Take by mouth.    . diclofenac Sodium (VOLTAREN) 1 % GEL Apply 4 g topically daily.    Marland Kitchen diltiazem (CARDIZEM CD) 120 MG 24 hr capsule Take 1 capsule (120 mg total) by mouth in the morning and at bedtime. 180 capsule 3  . diphenhydramine-acetaminophen (TYLENOL PM) 25-500 MG TABS tablet Take 2 tablets by mouth as needed (sleep).     . furosemide (LASIX) 20 MG tablet Take 20 mg by mouth daily.    Marland Kitchen glimepiride (AMARYL) 4 MG tablet Take 2 mg by mouth at bedtime.    . metFORMIN (GLUCOPHAGE-XR) 500 MG 24 hr tablet Take 500 mg by mouth 2 (two) times daily.   3  . ONETOUCH ULTRA test strip 1 each by Other route daily.    . rosuvastatin (CRESTOR) 5 MG tablet Take 5 mg by mouth once a week. Pt states she is taking weekly per Dr. Inda Merlin.    . valsartan (DIOVAN) 160 MG tablet 1 tablet    . warfarin (COUMADIN) 4 MG tablet TAKE ON MONDAY, WEDNESDAY AND FRIDAY AS DIRECTED BY THE COUMADIN CLINIC 45 tablet 1  . warfarin (COUMADIN) 5 MG tablet TAKE ON SUNDAY, TUESDAY, THURSDAY, AND SATURDAY OR TAKE AS DIRECTED BY THE COUMADIN CLINIC. 60 tablet 1   No current facility-administered medications for this visit.    Physical Exam: Vitals:   11/12/20 0947  BP: (!) 124/56  Pulse: 82  SpO2: 98%  Weight: 252 lb 3.2 oz (114.4 kg)  Height: 5\' 5"  (1.651 m)    GEN- The  patient is well appearing, alert and oriented x 3 today.   Head- normocephalic, atraumatic Eyes-  Sclera clear, conjunctiva pink Ears- hearing intact Oropharynx- clear Lungs- Clear to ausculation bilaterally, normal work of breathing Heart- Regular rate and rhythm with ectopy GI- soft, NT, ND, + BS Extremities- no clubbing, cyanosis, or edema  Wt Readings from Last 3 Encounters:  11/12/20 252 lb 3.2 oz (114.4 kg)  10/26/20 249 lb (112.9 kg)  10/23/20 250 lb (113.4 kg)    EKG tracing ordered today is personally reviewed and shows sinus with PVCs  Echo 10/09/20- EF 705 with hyperdynamic function, moderate LA enlargement  Assessment and Plan:  1. Persistent atrial fibrillation She has done  very well post ablation off AAD therapy No afib by recent event monitor chads2vasc score is 6.  She is on coumadin  2. HTN with stage III renal failure (last Cr 1.6) Stop celebrex Regular activity advised  3. Obesity Body mass index is 41.97 kg/m. Lifestyle modification again advised Will refer to [facility] exercise program  4. OSA Follows with Dr Radford Pax  5. Chronic diastolic dysfunction Stable No change required today Sodium restriction/ weight loss  6. PVCs She has frequent PVCs These are likely outflow tract in nature.  Given narrow qrs, they likely arise from near the normal conduction system.  I would not favor ablation. She has not tolerated beta blockers or verapamil previously I am not convinced that she is symptomatic from these.  She is clear that she does not wish to consider additional medicines today Stop celebrex which may be contributing We could consider flecainide if she decides to consider medicines in the future.  7. SOB/ chest discomfort Cath reviewed I have strongly encouraged regular exercise and weight loss.  Refer to [facility] AF program again today   Risks, benefits and potential toxicities for medications prescribed and/or refilled reviewed with patient today.    Follow-up with Dr Radford Pax as scheduled Return to see EP APP every 6 months  Thompson Grayer MD, [facility] 11/12/2020 10:07 AM

## 2020-11-12 NOTE — Patient Instructions (Signed)
Description   Continue taking Warfarin 5mg  daily, except for the 4mg  on Mondays, Wednesdays and Fridays. Recheck INR in 2 weeks. Coumadin Clinic [phone] Main [phone]

## 2020-11-15 ENCOUNTER — Ambulatory Visit (INDEPENDENT_AMBULATORY_CARE_PROVIDER_SITE_OTHER): Payer: Medicare PPO | Admitting: Cardiology

## 2020-11-15 ENCOUNTER — Encounter: Payer: Self-pay | Admitting: Cardiology

## 2020-11-15 ENCOUNTER — Other Ambulatory Visit: Payer: Self-pay

## 2020-11-15 ENCOUNTER — Ambulatory Visit: Payer: Medicare PPO | Admitting: Cardiology

## 2020-11-15 VITALS — BP 136/62 | HR 58 | Ht 65.0 in | Wt 252.0 lb

## 2020-11-15 DIAGNOSIS — I480 Paroxysmal atrial fibrillation: Secondary | ICD-10-CM

## 2020-11-15 DIAGNOSIS — R079 Chest pain, unspecified: Secondary | ICD-10-CM

## 2020-11-15 DIAGNOSIS — I5032 Chronic diastolic (congestive) heart failure: Secondary | ICD-10-CM | POA: Diagnosis not present

## 2020-11-15 DIAGNOSIS — I10 Essential (primary) hypertension: Secondary | ICD-10-CM | POA: Diagnosis not present

## 2020-11-15 DIAGNOSIS — I350 Nonrheumatic aortic (valve) stenosis: Secondary | ICD-10-CM

## 2020-11-15 DIAGNOSIS — E7800 Pure hypercholesterolemia, unspecified: Secondary | ICD-10-CM

## 2020-11-15 DIAGNOSIS — I493 Ventricular premature depolarization: Secondary | ICD-10-CM | POA: Diagnosis not present

## 2020-11-15 DIAGNOSIS — E119 Type 2 diabetes mellitus without complications: Secondary | ICD-10-CM | POA: Diagnosis not present

## 2020-11-15 DIAGNOSIS — R0600 Dyspnea, unspecified: Secondary | ICD-10-CM

## 2020-11-15 DIAGNOSIS — R0609 Other forms of dyspnea: Secondary | ICD-10-CM

## 2020-11-15 NOTE — Addendum Note (Signed)
Addended by: Antonieta Iba on: 11/15/2020 03:29 PM   Modules accepted: Orders

## 2020-11-15 NOTE — Patient Instructions (Signed)
Medication Instructions:  Your physician recommends that you continue on your current medications as directed. Please refer to the Current Medication list given to you today.  *If you need a refill on your cardiac medications before your next appointment, please call your pharmacy*  Lab Work: Come back fasting for a lipid panel and CMET  Follow-Up: At [facility], you and your health needs are our priority.  As part of our continuing mission to provide you with exceptional heart care, we have created designated Provider Care Teams.  These Care Teams include your primary Cardiologist (physician) and Advanced Practice Providers (APPs -  Physician Assistants and Nurse Practitioners) who all work together to provide you with the care you need, when you need it.  Your next appointment:   3 month(s)  The format for your next appointment:   In Person  Provider:   You will see one of the following Advanced Practice Providers on your designated Care Team:    Melina Copa, PA-C  Ermalinda Barrios, PA-C

## 2020-11-15 NOTE — Progress Notes (Signed)
Cardiology Office Note:    Date:  11/15/2020   ID:  Jenna Mcdonald, DOB 1946/05/07, MRN [id number]  PCP:  Josetta Huddle, MD  Cardiologist:  Fransico Him, MD    Referring MD: Josetta Huddle, MD   Chief Complaint  Patient presents with  . Coronary Artery Disease  . Hypertension  . Hyperlipidemia  . Atrial Fibrillation  . Congestive Heart Failure  . Sleep Apnea    History of Present Illness:    Jenna Mcdonald is a 75 y.o. female with a hx of OSA on BiPAP, HTN, PAF s/p afib ablation 2020, mild AS by echo 1/2018and morbid obesity.  2D echo 08/2018 showed normal LVF with mild AS and mild MR.   In February she was having problems with DOE and her chest feeling tight and then her heart starts beating fast.  This occured with just walking down her driveway ([id number]).  Leane Call 10/04/2020 showed no ischemia and 2D echo showed normal heart function with diastolic dysfunction and moderate LAE with mild AS and stable from prior echo.   She was recently seen back in [place] clinic 10/10/2020 and was noted to have frequent PVCs which were noted at time of stress test and her 2D echo.  She also reported that she had gained over 20lbs in the past year.  She then presented to the ER 10/12/20 with complaints of chest pain and DOE and ruled out with normal hsTrop but did have vascular congestion on CXR but refused Lasix and was discharged home.  She was having bigeminal PVCs at that time. A Zio Patch was ordered to assess PVC load.  She was seen back in afib clinic on 10/15/20 and was complaining of abdominal bloating and was started on Lasix 40mg  daily for 3 days and then taper down to 20mg  daily.   When I saw her last she was having a lot of problems still with SOB.  She ran out of Lasix and was now having more SOB.  Her SOB improved on Lasix. She was still  eating some salty foods.    She continued to have chest heaviness that was intermittent and awakened with chest pain the am I saw her  and was still having CP.  In the office she was very SOB.  We sent labs off showing an elevated hsTrop and she was sent to the ER for further evaluation.  She underwent right and left heart cath on 3/4 showing normal coronary arteries with elevated right heart pressures and PHTN which was felt to be due to CHF, OSA and obesity.  She was having frequent PVCs which were felt to be contributing to her volume overload and was started on Lasix 40mg  daily.    Due to a PVC load of 22% on heart monitor, she was seen by Dr. Rayann Heman and PVCs felt to originate from the outflow tract and ablation not favored. She had not tolerated BB or Verapamil in the past and Dr. Rayann Heman said there was nothing else to offer.   She is here today for followup and is doing well since her hospital discharge.  She denies any chest pain or pressure, SOB, DOE, PND, orthopnea, LE edema, dizziness or syncope. She does feel the palpitations at times but not really bothersome. She is compliant with her meds and is tolerating meds with no SE but says that she felt Lasix 20mg  daily was too much and is now taking Lasix 20mg  daily.    Past Medical History:  Diagnosis Date  . Anemia   . Aortic stenosis    mild by echo 09/2020 with mean AVG 40mmHg  . Breast CA ([place])    left  . Breast cancer ([place])   . Carotid stenosis    1-39% left  . Degenerative disc disease, lumbar    of the knees  . Diabetes mellitus   . Dysrhythmia   . 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[place])   . Fatigue    Severe secondary to to gemfibrozil  . Gastroesophageal reflux disease    denies  . GI bleeding    a. 06/2018: EGD showed multiple nonbleeding duodenal ulcers. 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  . Hammertoe    left second toe  . Hypercholesteremia   . Hypertension   . Insomnia   . Irritable bowel   . Low back pain   . Metabolic syndrome   . Morbid obesity ([place])   . OSA on CPAP \   bipap  .  Osteoarthritis   . Osteopenia   . Persistent atrial fibrillation [facility])    s/p afib ablation - Dr. Rayann Heman 08/2018  . Personal history of radiation therapy   . Pneumonia   . PONV (postoperative nausea and vomiting)   . Sigmoid diverticulosis   . Thalassemia minor   . Vitamin D deficiency     Past Surgical History:  Procedure Laterality Date  . ATRIAL FIBRILLATION ABLATION N/A 09/21/2018   Procedure: ATRIAL FIBRILLATION ABLATION;  Surgeon: Thompson Grayer, MD;  Location: [place] CV LAB;  Service: Cardiovascular;  Laterality: N/A;  . BIOPSY  07/06/2018   Procedure: BIOPSY;  Surgeon: Clarene Essex, MD;  Location: [place];  Service: Endoscopy;;  . BREAST LUMPECTOMY Left 05/28/2009  . BREAST SURGERY Left 10   lumpectomy  . CARDIOVERSION N/A 07/07/2018   Procedure: CARDIOVERSION;  Surgeon: [name], MD;  Location[facility] ENDOSCOPY;  Service: Cardiovascular;  Laterality: N/A;  . CARDIOVERSION N/A 07/14/2018   Procedure: CARDIOVERSION;  Surgeon: Larey Dresser, MD;  Location: [facility] ENDOSCOPY;  Service: Cardiovascular;  Laterality: N/A;  . CESAREAN SECTION    . COLONOSCOPY WITH PROPOFOL N/A 07/06/2018   Procedure: COLONOSCOPY WITH PROPOFOL;  Surgeon: Clarene Essex, MD;  Location: [place];  Service: Endoscopy;  Laterality: N/A;  . CYSTECTOMY  70   pilonidal   . DILATION AND CURETTAGE OF UTERUS    . ESOPHAGOGASTRODUODENOSCOPY (EGD) WITH PROPOFOL N/A 07/06/2018   Procedure: ESOPHAGOGASTRODUODENOSCOPY (EGD) WITH PROPOFOL;  Surgeon: Clarene Essex, MD;  Location: [place];  Service: Endoscopy;  Laterality: N/A;  . EYE SURGERY Bilateral 12/14   cataracts  . HOT HEMOSTASIS N/A 07/06/2018   Procedure: HOT HEMOSTASIS (ARGON PLASMA COAGULATION/BICAP);  Surgeon: Clarene Essex, MD;  Location: [place];  Service: Endoscopy;  Laterality: N/A;  . MAXIMUM ACCESS (MAS)POSTERIOR LUMBAR INTERBODY FUSION (PLIF) 2 LEVEL N/A 09/22/2013   Procedure: FOR MAXIMUM ACCESS  POSTERIOR LUMBAR INTERBODY  FUSION LUMBAR THREE-FOUR FOUR-FIVE;  Surgeon: Eustace Moore, MD;  Location: [place] NEURO ORS;  Service: Neurosurgery;  Laterality: N/A;  . OOPHORECTOMY     wedge section not rem  . 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 REPLACEMENT TOTAL KNEE    . RIGHT/LEFT HEART CATH AND CORONARY ANGIOGRAPHY N/A 10/26/2020   Procedure: RIGHT/LEFT HEART CATH AND CORONARY ANGIOGRAPHY;  Surgeon: Leonie Man, MD;  Location: [place] CV LAB;  Service: Cardiovascular;  Laterality: N/A;  . TEE WITHOUT CARDIOVERSION N/A 07/07/2018   Procedure: TRANSESOPHAGEAL ECHOCARDIOGRAM (TEE);  Surgeon:  [name], MD;  Location: [place];  Service: Cardiovascular;  Laterality: N/A;  . TEE WITHOUT CARDIOVERSION N/A 09/21/2018   Procedure: TRANSESOPHAGEAL ECHOCARDIOGRAM (TEE);  Surgeon: Acie Fredrickson Wonda Cheng, MD;  Location: [facility] ENDOSCOPY;  Service: Cardiovascular;  Laterality: N/A;  . TONSILLECTOMY    . TUBAL LIGATION      Current Medications: Current Meds  Medication Sig  . Calcium Carbonate-Vitamin D (CALCIUM 600 + D PO) Take 1 tablet by mouth daily.   . Cholecalciferol (VITAMIN D3) 25 MCG (1000 UT) CAPS Take by mouth.  . diclofenac Sodium (VOLTAREN) 1 % GEL Apply 4 g topically daily.  Marland Kitchen diltiazem (CARDIZEM CD) 120 MG 24 hr capsule Take 1 capsule (120 mg total) by mouth daily.  . diphenhydramine-acetaminophen (TYLENOL PM) 25-500 MG TABS tablet Take 2 tablets by mouth as needed (sleep).   . furosemide (LASIX) 20 MG tablet Take 20 mg by mouth daily.  Marland Kitchen glimepiride (AMARYL) 4 MG tablet Take 2 mg by mouth at bedtime.  . metFORMIN (GLUCOPHAGE-XR) 500 MG 24 hr tablet Take 500 mg by mouth 2 (two) times daily.   Glory Rosebush ULTRA test strip 1 each by Other route daily.  . rosuvastatin (CRESTOR) 5 MG tablet Take 5 mg by mouth once a week. Pt states she is taking weekly per Dr. Inda Merlin.  . valsartan (DIOVAN) 160 MG tablet 1 tablet  . warfarin (COUMADIN) 4 MG tablet TAKE ON MONDAY, WEDNESDAY AND FRIDAY AS DIRECTED BY THE COUMADIN  CLINIC  . warfarin (COUMADIN) 5 MG tablet TAKE ON SUNDAY, TUESDAY, THURSDAY, AND SATURDAY OR TAKE AS DIRECTED BY THE COUMADIN CLINIC.     Allergies:   Metoprolol, Gemfibrozil, Other, Tamoxifen, Tetracyclines & related, Toprol xl [metoprolol tartrate], Verapamil, Vicodin [hydrocodone-acetaminophen], and Aromasin [exemestane]   Social History   Socioeconomic History  . Marital status: Widowed    Spouse name: Not on file  . Number of children: Not on file  . Years of education: Not on file  . Highest education level: Not on file  Occupational History  . Not on file  Tobacco Use  . Smoking status: Former Smoker    Packs/day: 0.50    Years: 10.00    Pack years: 5.00    Types: Cigarettes    Quit date: 11/12/2003    Years since quitting: 17.0  . Smokeless tobacco: Never Used  Vaping Use  . Vaping Use: Never used  Substance and Sexual Activity  . Alcohol use: Yes    Comment: occasionally  . Drug use: No  . Sexual activity: Not Currently    Birth control/protection: Post-menopausal  Other Topics Concern  . Not on file  Social History Narrative  . Not on file   Social Determinants of Health   Financial Resource Strain: Not on file  Food Insecurity: Not on file  Transportation Needs: Not on file  Physical Activity: Not on file  Stress: Not on file  Social Connections: Not on file     Family History: The patient's family history includes Anesthesia problems in her mother; Diabetes in her father and mother.  ROS:   Please see the history of present illness.    ROS  All other systems reviewed and negative.   EKGs/Labs/Other Studies Reviewed:    The following studies were reviewed today: PAP compliance download  Veva Holes 10/04/2020 Study Highlights    The left ventricular ejection fraction is moderately decreased (30-44%).  Nuclear stress EF: 38%.  There was no ST segment deviation noted during stress.  The  study is normal.  This is a low risk  study.   Normal pharmacologic nuclear stress test with no evidence for prior infarct or ischemia. Very frequent PACs and short runs of atrial tachycardia. LVEF appears underestimated, correlation with an echocardiogram is recommended.  2D echo 10/09/2020 IMPRESSIONS  1. Left ventricular ejection fraction, by estimation, is 70 to 75%. The  left ventricle has hyperdynamic function. The left ventricle has no  regional wall motion abnormalities. There is mild left ventricular  hypertrophy. Left ventricular diastolic  parameters are consistent with Grade II diastolic dysfunction  (pseudonormalization).  2. Right ventricular systolic function is normal. The right ventricular  size is normal. There is normal pulmonary artery systolic pressure.  3. Left atrial size was moderately dilated.  4. The mitral valve is normal in structure. Trivial mitral valve  regurgitation. No evidence of mitral stenosis.  5. The aortic valve is grossly normal. Aortic valve regurgitation is not  visualized. Mild aortic valve stenosis.    EKG:  EKG is  ordered today and showed NSR with PACs and PVCs  Recent Labs: 10/23/2020: B Natriuretic Peptide 192.1; NT-Pro BNP 558; Platelets 178; TSH 4.853 10/24/2020: ALT 18 10/26/2020: Hemoglobin 11.2; Hemoglobin 11.2 11/02/2020: BUN 33; Creatinine, Ser 1.66; Potassium 5.0; Sodium 138   Recent Lipid Panel    Component Value Date/Time   CHOL 151 10/24/2020 0344   TRIG 232 (H) 10/24/2020 0344   HDL 36 (L) 10/24/2020 0344   CHOLHDL 4.2 10/24/2020 0344   VLDL 46 (H) 10/24/2020 0344   LDLCALC 69 10/24/2020 0344    Physical Exam:    VS:  BP 136/62   Pulse (!) 58   Ht 5\' 5"  (1.651 m)   Wt 252 lb (114.3 kg)   BMI 41.93 kg/m     Wt Readings from Last 3 Encounters:  11/15/20 252 lb (114.3 kg)  11/12/20 252 lb 3.2 oz (114.4 kg)  10/26/20 249 lb (112.9 kg)     GEN: Well nourished, well developed in no acute distress HEENT: Normal NECK: No JVD; No carotid  bruits LYMPHATICS: No lymphadenopathy CARDIAC:RRR, no murmurs, rubs, gallops with occasional ectopy RESPIRATORY:  Clear to auscultation without rales, wheezing or rhonchi  ABDOMEN: Soft, non-tender, non-distended MUSCULOSKELETAL:  No edema; No deformity  SKIN: Warm and dry NEUROLOGIC:  Alert and oriented x 3 PSYCHIATRIC:  Normal affect    ASSESSMENT:    1. Nonrheumatic aortic valve stenosis   2. Essential hypertension   3. Paroxysmal atrial fibrillation (HCC)   4. Obesity, Class III, BMI 40-49.9 (morbid obesity) ([place])   5. Type 2 diabetes mellitus without complication, without long-term current use of insulin (HCC)   6. Chest pain of uncertain etiology   7. PVC (premature ventricular contraction)   8. DOE (dyspnea on exertion)   9. Chronic diastolic heart failure ([place])   10. Pure hypercholesterolemia    PLAN:    In order of problems listed above:  Aortic stenosis -2D echo 08/2018 showed very mild AS with mean AVG 70mmHg. -repeat echo 10/09/2020 was stable with mean AVG 11.61mmHg  HTN -BP is adequately controlled on exam today -continue Cardizem CD 120mg  BID and Valsartan 160mg  daily   PAF -s/p afib ablation 08/2018 -she continues to maintain NSR -continue Cardizem and warfarin -she denies any bleeding problems on warfarin -Hbg was 11.2 on last hospital admit  Obesity -I have encouraged her to get into a routine exercise program and cut back on carbs and portions.  -Dr. Rayann Heman referred her to the  [facility] for weight loss  Type 2 DM -followed by PCP -continue metformin 500mg  BID and Glimepiride -she would benefit from [place] but concerned about cost>>I will check with PharmD  Chest pain -she has recently had problems with chest tightness and DOE with minimal exertion walking down her driveway -she has known CAD with coronary CTA 08/2018 showing 25-49% LAD stenosis -Leane Call 09/2020 showed no ischemia -recent cardiac cath earlier this month showed normal coronary  arteries with elevated filling pressure c/w CHF -continue statin  PVCs -noted recently on stress test, echo and EKG -ziopatch showed 22% PVC load -seen by Ep and felt to be of outflow tract origin and EP felt she was not really symptomatic -she has not tolerated BB or Verapamil in the past and was kept on Cardizem  DOE -suspect this is multifactorial from obesity, sedentary state, PVCs, chronic diastolic CHF -she had elevated filling pressures on cath and restarted on lasix -PHTN noted and multifactorial from OSA, obesity, CHF -continue diuretics  Chronic diastolic CHF -she does not appear volume overloaded on exam -continue Lasix 40mg  daily  HLD -continue Crestor 5mg  weekly -LDL was 69 -her TAGS were elevated at hospitalization but no fasting  -repeat FLp   Followup with me in 3 months  Medication Adjustments/Labs and Tests Ordered: Current medicines are reviewed at length with the patient today.  Concerns regarding medicines are outlined above.  No orders of the defined types were placed in this encounter.  No orders of the defined types were placed in this encounter.   Signed, Fransico Him, MD  11/15/2020 3:18 PM    [place]

## 2020-11-16 ENCOUNTER — Telehealth: Payer: Self-pay

## 2020-11-16 ENCOUNTER — Other Ambulatory Visit: Payer: Medicare PPO | Admitting: *Deleted

## 2020-11-16 DIAGNOSIS — E7800 Pure hypercholesterolemia, unspecified: Secondary | ICD-10-CM | POA: Diagnosis not present

## 2020-11-16 LAB — COMPREHENSIVE METABOLIC PANEL
ALT: 15 IU/L (ref 0–32)
AST: 14 IU/L (ref 0–40)
Albumin/Globulin Ratio: 1.7 (ref 1.2–2.2)
Albumin: 4.4 g/dL (ref 3.7–4.7)
Alkaline Phosphatase: 103 IU/L (ref 44–121)
BUN/Creatinine Ratio: 18 (ref 12–28)
BUN: 27 mg/dL (ref 8–27)
Bilirubin Total: 0.6 mg/dL (ref 0.0–1.2)
CO2: 22 mmol/L (ref 20–29)
Calcium: 9.5 mg/dL (ref 8.7–10.3)
Chloride: 103 mmol/L (ref 96–106)
Creatinine, Ser: 1.54 mg/dL — ABNORMAL HIGH (ref 0.57–1.00)
Globulin, Total: 2.6 g/dL (ref 1.5–4.5)
Glucose: 150 mg/dL — ABNORMAL HIGH (ref 65–99)
Potassium: 4.6 mmol/L (ref 3.5–5.2)
Sodium: 142 mmol/L (ref 134–144)
Total Protein: 7 g/dL (ref 6.0–8.5)
eGFR: 35 mL/min/{1.73_m2} — ABNORMAL LOW (ref >59)

## 2020-11-16 LAB — LIPID PANEL
Chol/HDL Ratio: 4 ratio (ref 0.0–4.4)
Cholesterol, Total: 166 mg/dL (ref 100–199)
HDL: 41 mg/dL (ref >39)
LDL Chol Calc (NIH): 83 mg/dL (ref 0–99)
Triglycerides: 252 mg/dL — ABNORMAL HIGH (ref 0–149)
VLDL Cholesterol Cal: 42 mg/dL — ABNORMAL HIGH (ref 5–40)

## 2020-11-16 NOTE — Telephone Encounter (Signed)
Received referral from AFIB clinic for PREP LVMT pt requesting call back to discuss.

## 2020-11-19 ENCOUNTER — Telehealth: Payer: Self-pay

## 2020-11-19 ENCOUNTER — Telehealth (HOSPITAL_COMMUNITY): Payer: Self-pay | Admitting: *Deleted

## 2020-11-19 DIAGNOSIS — R109 Unspecified abdominal pain: Secondary | ICD-10-CM | POA: Diagnosis not present

## 2020-11-19 DIAGNOSIS — N289 Disorder of kidney and ureter, unspecified: Secondary | ICD-10-CM | POA: Diagnosis not present

## 2020-11-19 DIAGNOSIS — I509 Heart failure, unspecified: Secondary | ICD-10-CM | POA: Diagnosis not present

## 2020-11-19 DIAGNOSIS — R3915 Urgency of urination: Secondary | ICD-10-CM | POA: Diagnosis not present

## 2020-11-19 DIAGNOSIS — R350 Frequency of micturition: Secondary | ICD-10-CM | POA: Diagnosis not present

## 2020-11-19 NOTE — Telephone Encounter (Signed)
Attempted to call patient regarding upcoming cardiac MRI appointment. Unable to leave a message.  Timica Marcom RN Navigator Cardiac Imaging [facility] [phone] Office [phone] Cell  

## 2020-11-19 NOTE — Telephone Encounter (Signed)
Talked with pt about referral to [place] she needs to exercise Having problems with urinating frequently at night she needs to work out before beginning exercise program Would prefer AM class at [facility] Will notify her when one is planned for her.

## 2020-11-19 NOTE — Telephone Encounter (Signed)
-----   Message from Sueanne Margarita, MD sent at 11/16/2020  9:10 AM EDT ----- Thanks Berton Mount  Stop Amaryl and start Jardiance 10mg  daily. Please send a not to her PCP that we are changing her meds  Traci ----- Message ----- From: Leeroy Bock, RPH-CPP Sent: 11/15/2020   4:13 PM EDT To: Sueanne Margarita, MD  It looks like Vania Rea is actually cheaper than Farxiga on her plan. Vania Rea is a Tier 2 ($40/1 month or $80/3 month supply). Wilder Glade is a Tier 3 ($64/1 month or $128/3 month supply).  Thanks, Jinny Blossom ----- Message ----- From: Sueanne Margarita, MD Sent: 11/15/2020   3:23 PM EDT To: Leeroy Bock, RPH-CPP  Megan  This patient is having problems with chronic diastolic CHF >>not sure if amaryl is contributing.  SHe is concerned about starting Wilder Glade due to cost - can you tell me what her out of pocket would be

## 2020-11-19 NOTE — Telephone Encounter (Signed)
Spoke with the patient and advised her on recommendation to stop Amaryl and start on Jardiance 10 mg daily.  Patient states that she is willing to switch but would like to make sure it is okay with her PCP first. She states that she saw Dr. Inda Merlin today due to some issues with frequent urination and he has ordered some testing. She would like to make sure he is agreeable with this plan prior to starting.  Advised patient that I would reach out to Dr. Inda Merlin and let her know.

## 2020-11-20 ENCOUNTER — Ambulatory Visit (HOSPITAL_COMMUNITY)
Admission: RE | Admit: 2020-11-20 | Discharge: 2020-11-20 | Disposition: A | Discharge status: Home / Self Care | Payer: Medicare PPO | Source: Ambulatory Visit | Attending: Cardiology | Admitting: Cardiology

## 2020-11-20 ENCOUNTER — Other Ambulatory Visit: Payer: Self-pay

## 2020-11-20 DIAGNOSIS — I493 Ventricular premature depolarization: Secondary | ICD-10-CM | POA: Insufficient documentation

## 2020-11-20 MED ORDER — DIAZEPAM 5 MG PO TABS: ORAL_TABLET | ORAL | 0 refills | Status: DC

## 2020-11-20 MED ORDER — EMPAGLIFLOZIN 10 MG PO TABS: 10.0000 mg | ORAL_TABLET | Freq: Every day | ORAL | 3 refills | Status: DC

## 2020-11-20 MED ORDER — GADOBUTROL 1 MMOL/ML IV SOLN
10.0000 mL | Freq: Once | INTRAVENOUS | Status: AC | PRN
  Administered 2020-11-20: 10 mL via INTRAVENOUS

## 2020-11-20 NOTE — Telephone Encounter (Signed)
Dr. Inda Merlin is agreeable with the patient stopping Amaryl and trying Jardiance. Rx has been sent in.

## 2020-11-20 NOTE — Telephone Encounter (Signed)
Spoke with the patient and advised her that Dr. Radford Pax will call in a prescription for Valium 5 mg for her to take upon arrival to her MRI. Advised patient that she will need to have someone drive her there and back. Patient verbalized understanding.

## 2020-11-21 DIAGNOSIS — N289 Disorder of kidney and ureter, unspecified: Secondary | ICD-10-CM | POA: Diagnosis not present

## 2020-11-21 DIAGNOSIS — N2889 Other specified disorders of kidney and ureter: Secondary | ICD-10-CM | POA: Diagnosis not present

## 2020-11-21 DIAGNOSIS — N281 Cyst of kidney, acquired: Secondary | ICD-10-CM | POA: Diagnosis not present

## 2020-11-23 ENCOUNTER — Ambulatory Visit (INDEPENDENT_AMBULATORY_CARE_PROVIDER_SITE_OTHER): Payer: Medicare PPO

## 2020-11-23 ENCOUNTER — Other Ambulatory Visit: Payer: Self-pay

## 2020-11-23 ENCOUNTER — Telehealth: Payer: Self-pay

## 2020-11-23 DIAGNOSIS — Z5181 Encounter for therapeutic drug level monitoring: Secondary | ICD-10-CM

## 2020-11-23 DIAGNOSIS — I4891 Unspecified atrial fibrillation: Secondary | ICD-10-CM

## 2020-11-23 DIAGNOSIS — E7800 Pure hypercholesterolemia, unspecified: Secondary | ICD-10-CM

## 2020-11-23 LAB — POCT INR: INR: 2.3 (ref 2.0–3.0)

## 2020-11-23 MED ORDER — ROSUVASTATIN CALCIUM 5 MG PO TABS: 5.0000 mg | ORAL_TABLET | ORAL | 3 refills | Status: DC

## 2020-11-23 NOTE — Telephone Encounter (Signed)
-----   Message from Rollen Sox, [facility] sent at 11/23/2020  8:29 AM EDT ----- Recommend vascepa 2 grams BID due to elevated triglycerides and patient's known CAD.  Patient only takes rosuvastatin once weekly.  Would she be able to increase this to twice or three times weekly?  ----- Message ----- From: Antonieta Iba, RN Sent: 11/23/2020   7:34 AM EDT To: Cv Div Pharmd  Dr. Radford Pax would like recommendations from lipid clinic

## 2020-11-23 NOTE — Patient Instructions (Signed)
-   Continue taking Warfarin 5mg  daily, except for the 4mg  on Mondays, Wednesdays and Fridays.  - Recheck INR in 4 weeks.  Coumadin Clinic [phone] Main [phone]

## 2020-11-23 NOTE — Telephone Encounter (Signed)
The patient has been notified of the result and verbalized understanding.  All questions (if any) were answered. Antonieta Iba, RN 11/23/2020 2:36 PM  Patient does not want to start on Vascepa. She is willing to increase rosuvastatin to 5 mg three times per week.

## 2020-12-03 DIAGNOSIS — G4733 Obstructive sleep apnea (adult) (pediatric): Secondary | ICD-10-CM | POA: Diagnosis not present

## 2020-12-18 ENCOUNTER — Other Ambulatory Visit: Payer: Self-pay

## 2020-12-18 ENCOUNTER — Ambulatory Visit (INDEPENDENT_AMBULATORY_CARE_PROVIDER_SITE_OTHER): Payer: Medicare PPO

## 2020-12-18 DIAGNOSIS — Z5181 Encounter for therapeutic drug level monitoring: Secondary | ICD-10-CM | POA: Diagnosis not present

## 2020-12-18 DIAGNOSIS — I4891 Unspecified atrial fibrillation: Secondary | ICD-10-CM | POA: Diagnosis not present

## 2020-12-18 LAB — POCT INR: INR: 2.5 (ref 2.0–3.0)

## 2020-12-18 NOTE — Patient Instructions (Signed)
Description   Continue taking Warfarin 5mg daily, except for the 4mg on Mondays, Wednesdays and Fridays. Recheck INR in 6 weeks. [facility] [phone] Main [phone]     

## 2020-12-26 DIAGNOSIS — M1711 Unilateral primary osteoarthritis, right knee: Secondary | ICD-10-CM | POA: Diagnosis not present

## 2021-01-08 ENCOUNTER — Telehealth: Payer: Self-pay | Admitting: Cardiology

## 2021-01-08 ENCOUNTER — Other Ambulatory Visit: Payer: Medicare PPO

## 2021-01-08 ENCOUNTER — Other Ambulatory Visit: Payer: Self-pay

## 2021-01-08 DIAGNOSIS — E7800 Pure hypercholesterolemia, unspecified: Secondary | ICD-10-CM | POA: Diagnosis not present

## 2021-01-08 DIAGNOSIS — Z5181 Encounter for therapeutic drug level monitoring: Secondary | ICD-10-CM

## 2021-01-08 LAB — LIPID PANEL
Chol/HDL Ratio: 2.8 ratio (ref 0.0–4.4)
Cholesterol, Total: 119 mg/dL (ref 100–199)
HDL: 43 mg/dL (ref >39)
LDL Chol Calc (NIH): 45 mg/dL (ref 0–99)
Triglycerides: 189 mg/dL — ABNORMAL HIGH (ref 0–149)
VLDL Cholesterol Cal: 31 mg/dL (ref 5–40)

## 2021-01-08 LAB — ALT: ALT: 16 IU/L (ref 0–32)

## 2021-01-08 NOTE — Telephone Encounter (Signed)
Left message for patient to call back  

## 2021-01-08 NOTE — Telephone Encounter (Signed)
Patient came to me while waiting for lab work and stated she would like to speak to Dr Theodosia Blender nurse. Stated she had quick taking her Lasik due to leg cramps and not being able to sleep for two weeks. She told me the leg cramps had recently stopped and wanted to know what she needed to do now.

## 2021-01-09 NOTE — Telephone Encounter (Signed)
Spoke with the patient who states that she has been having bad leg cramps at night and has been frustrated with how often she has been having to use the restroom. She states that she discontinued taking Lasix on Saturday and has not had any cramping since. She denies any increased swelling or shortness of breath since discontinuing. She wants to know if she should resume Lasix at a lower dose or if she can stop taking it all together. Verified that patient was talking about Lasix rather than Crestor. She states that she has been tolerating her increased dose of crestor without any issues.

## 2021-01-11 NOTE — Telephone Encounter (Signed)
Left message for patient to call back  

## 2021-01-14 MED ORDER — FUROSEMIDE 20 MG PO TABS: 20.0000 mg | ORAL_TABLET | ORAL | 3 refills | Status: DC | PRN

## 2021-01-14 NOTE — Telephone Encounter (Signed)
See MyChart message

## 2021-01-14 NOTE — Addendum Note (Signed)
Addended by: Antonieta Iba on: 01/14/2021 11:07 AM   Modules accepted: Orders

## 2021-01-29 ENCOUNTER — Other Ambulatory Visit: Payer: Self-pay

## 2021-01-29 ENCOUNTER — Ambulatory Visit (INDEPENDENT_AMBULATORY_CARE_PROVIDER_SITE_OTHER): Payer: Medicare PPO | Admitting: *Deleted

## 2021-01-29 DIAGNOSIS — I4891 Unspecified atrial fibrillation: Secondary | ICD-10-CM

## 2021-01-29 DIAGNOSIS — Z5181 Encounter for therapeutic drug level monitoring: Secondary | ICD-10-CM

## 2021-01-29 LAB — POCT INR: INR: 2.8 (ref 2.0–3.0)

## 2021-01-29 NOTE — Patient Instructions (Signed)
Description   Today take 7.5mg  then continue taking Warfarin 5mg  daily, except for the 4mg  on Mondays, Wednesdays and Fridays.  Recheck INR in 6 weeks. Coumadin Clinic [phone] Main [phone]

## 2021-02-01 DIAGNOSIS — M1711 Unilateral primary osteoarthritis, right knee: Secondary | ICD-10-CM | POA: Diagnosis not present

## 2021-02-05 ENCOUNTER — Ambulatory Visit: Payer: Medicare PPO | Admitting: Physician Assistant

## 2021-02-08 DIAGNOSIS — M1711 Unilateral primary osteoarthritis, right knee: Secondary | ICD-10-CM | POA: Diagnosis not present

## 2021-02-15 DIAGNOSIS — M1711 Unilateral primary osteoarthritis, right knee: Secondary | ICD-10-CM | POA: Diagnosis not present

## 2021-03-04 DIAGNOSIS — G4733 Obstructive sleep apnea (adult) (pediatric): Secondary | ICD-10-CM | POA: Diagnosis not present

## 2021-03-12 ENCOUNTER — Other Ambulatory Visit: Payer: Self-pay

## 2021-03-12 ENCOUNTER — Ambulatory Visit (INDEPENDENT_AMBULATORY_CARE_PROVIDER_SITE_OTHER): Payer: Medicare PPO | Admitting: *Deleted

## 2021-03-12 DIAGNOSIS — I4891 Unspecified atrial fibrillation: Secondary | ICD-10-CM

## 2021-03-12 DIAGNOSIS — Z79899 Other long term (current) drug therapy: Secondary | ICD-10-CM | POA: Diagnosis not present

## 2021-03-12 DIAGNOSIS — R202 Paresthesia of skin: Secondary | ICD-10-CM | POA: Diagnosis not present

## 2021-03-12 DIAGNOSIS — Z5181 Encounter for therapeutic drug level monitoring: Secondary | ICD-10-CM | POA: Diagnosis not present

## 2021-03-12 DIAGNOSIS — Z7984 Long term (current) use of oral hypoglycemic drugs: Secondary | ICD-10-CM | POA: Diagnosis not present

## 2021-03-12 DIAGNOSIS — Z0000 Encounter for general adult medical examination without abnormal findings: Secondary | ICD-10-CM | POA: Diagnosis not present

## 2021-03-12 DIAGNOSIS — I10 Essential (primary) hypertension: Secondary | ICD-10-CM | POA: Diagnosis not present

## 2021-03-12 DIAGNOSIS — E1165 Type 2 diabetes mellitus with hyperglycemia: Secondary | ICD-10-CM | POA: Diagnosis not present

## 2021-03-12 DIAGNOSIS — E559 Vitamin D deficiency, unspecified: Secondary | ICD-10-CM | POA: Diagnosis not present

## 2021-03-12 DIAGNOSIS — G4733 Obstructive sleep apnea (adult) (pediatric): Secondary | ICD-10-CM | POA: Diagnosis not present

## 2021-03-12 DIAGNOSIS — D6869 Other thrombophilia: Secondary | ICD-10-CM | POA: Diagnosis not present

## 2021-03-12 DIAGNOSIS — Z1389 Encounter for screening for other disorder: Secondary | ICD-10-CM | POA: Diagnosis not present

## 2021-03-12 DIAGNOSIS — M545 Low back pain, unspecified: Secondary | ICD-10-CM | POA: Diagnosis not present

## 2021-03-12 LAB — POCT INR: INR: 2.8 (ref 2.0–3.0)

## 2021-03-12 NOTE — Patient Instructions (Signed)
Description   Continue taking Warfarin 5mg  daily, except for the 4mg  on Mondays, Wednesdays and Fridays. Recheck INR in 6 weeks. Coumadin Clinic [phone] Main [phone]

## 2021-03-19 DIAGNOSIS — H43813 Vitreous degeneration, bilateral: Secondary | ICD-10-CM | POA: Diagnosis not present

## 2021-03-19 DIAGNOSIS — E119 Type 2 diabetes mellitus without complications: Secondary | ICD-10-CM | POA: Diagnosis not present

## 2021-03-19 DIAGNOSIS — H04123 Dry eye syndrome of bilateral lacrimal glands: Secondary | ICD-10-CM | POA: Diagnosis not present

## 2021-03-19 DIAGNOSIS — H35373 Puckering of macula, bilateral: Secondary | ICD-10-CM | POA: Diagnosis not present

## 2021-03-19 DIAGNOSIS — Z961 Presence of intraocular lens: Secondary | ICD-10-CM | POA: Diagnosis not present

## 2021-03-19 DIAGNOSIS — H35342 Macular cyst, hole, or pseudohole, left eye: Secondary | ICD-10-CM | POA: Diagnosis not present

## 2021-03-19 DIAGNOSIS — H0102B Squamous blepharitis left eye, upper and lower eyelids: Secondary | ICD-10-CM | POA: Diagnosis not present

## 2021-03-19 DIAGNOSIS — H0102A Squamous blepharitis right eye, upper and lower eyelids: Secondary | ICD-10-CM | POA: Diagnosis not present

## 2021-04-03 DIAGNOSIS — M65331 Trigger finger, right middle finger: Secondary | ICD-10-CM | POA: Diagnosis not present

## 2021-04-05 ENCOUNTER — Telehealth: Payer: Self-pay | Admitting: *Deleted

## 2021-04-05 NOTE — Telephone Encounter (Signed)
   [facility] Pre-operative Risk Assessment    Patient Name: Jenna Mcdonald  DOB: 10/06/1945 MRN: [id number]  HEARTCARE STAFF:  - IMPORTANT!!!!!! Under Visit Info/Reason for Call, type in Other and utilize the format Clearance MM/DD/YY or Clearance TBD. Do not use dashes or single digits. - Please review there is not already an duplicate clearance open for this procedure. - If request is for dental extraction, please clarify the # of teeth to be extracted. - If the patient is currently at the dentist's office, call Pre-Op Callback Staff (MA/nurse) to input urgent request.  - If the patient is not currently in the dentist office, please route to the Pre-Op pool.  Request for surgical clearance:  What type of surgery is being performed? RIGHT MIDDLE FINGER TRIGGER RELEASE  When is this surgery scheduled? 04/22/21  What type of clearance is required (medical clearance vs. Pharmacy clearance to hold med vs. Both)? BOTH  Are there any medications that need to be held prior to surgery and how long?  WARFARIN  Practice name and name of physician performing surgery? [facility]; DR. Roseanne Kaufman  What is the office phone number? [phone]   7.   What is the office fax number? [place]  8.   Anesthesia type (None, local, MAC, general) ? LOCAL   [name] 04/05/2021, 12:56 PM  _________________________________________________________________   (provider comments below)

## 2021-04-08 NOTE — Telephone Encounter (Signed)
Patient with diagnosis of atrial fibrillation on warfarin for anticoagulation.    Procedure: right middle finger trigger release Date of procedure: 04/22/21   CHA2DS2-VASc Score = 5  This indicates a 7.2% annual risk of stroke. The patient's score is based upon: CHF History: No HTN History: Yes Diabetes History: Yes Stroke History: No Vascular Disease History: No Age Score: 2 Gender Score: 1    CrCl 40 Platelet count 178  Per office protocol, patient can hold warfarin for 5 days prior to procedure.   Patient will not need bridging with Lovenox (enoxaparin) around procedure.  Patient should restart warfarin on the evening of procedure or day after, at discretion of procedure MD

## 2021-04-08 NOTE — Telephone Encounter (Signed)
   Name: Jenna Mcdonald  DOB: 21-Oct-1945  MRN: [id number]   Primary Cardiologist: Fransico Him, MD  Chart reviewed as part of pre-operative protocol coverage. Patient was contacted 04/08/2021 in reference to pre-operative risk assessment for pending surgery as outlined below.  Jenna Mcdonald was last seen on 11/15/20 by Dr. Radford Pax.  Since that day, Jenna Mcdonald has done fine from a cardiac standpoint. She has been limited in activity by knee pain, though is still able to complete 4 METs without anginal complaints.   Therefore, based on ACC/AHA guidelines, the patient would be at acceptable risk for the planned procedure without further cardiovascular testing.   The patient was advised that if she develops new symptoms prior to surgery to contact our office to arrange for a follow-up visit, and she verbalized understanding.  Per pharmacy recommendations, patient an hold coumadin 5 days prior to her upcoming surgery with plans to restart as soon as she is cleared to do so by her surgeon.  I will route this recommendation to the requesting party via Epic fax function and remove from pre-op pool. Please call with questions.  Abigail Butts, PA-C 04/08/2021, 1:05 PM

## 2021-04-12 ENCOUNTER — Telehealth: Payer: Self-pay | Admitting: Cardiology

## 2021-04-12 NOTE — Telephone Encounter (Signed)
Spoke with the patient who reports that under her left she has a tender area that she describes as a dull ache. She states that she does not feel a lump and there is no swelling or redness that she can see. 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 I have advised the patient to call her PCP.

## 2021-04-12 NOTE — Telephone Encounter (Signed)
   pt states she is having a pain under her arm, she thinks its a lymphnode.. would like to speak with a nurse, please advise.

## 2021-04-17 ENCOUNTER — Ambulatory Visit (INDEPENDENT_AMBULATORY_CARE_PROVIDER_SITE_OTHER): Payer: Medicare PPO

## 2021-04-17 ENCOUNTER — Other Ambulatory Visit: Payer: Self-pay

## 2021-04-17 DIAGNOSIS — I4891 Unspecified atrial fibrillation: Secondary | ICD-10-CM

## 2021-04-17 DIAGNOSIS — Z5181 Encounter for therapeutic drug level monitoring: Secondary | ICD-10-CM | POA: Diagnosis not present

## 2021-04-17 LAB — POCT INR: INR: 3.1 — AB (ref 2.0–3.0)

## 2021-04-17 NOTE — Patient Instructions (Signed)
Description   Start holding Warfarin today, prior to procedure on Monday 04/22/21.   Patient should restart warfarin on the evening of procedure or day after, at discretion of procedure MD, resume previous dosage regimen '5mg'$  daily, except for the '4mg'$  on Mondays, Wednesdays and Fridays . Recheck INR in 1 week post procedure. [facility] [phone] Main [phone]

## 2021-04-22 DIAGNOSIS — Z4789 Encounter for other orthopedic aftercare: Secondary | ICD-10-CM | POA: Diagnosis not present

## 2021-04-22 DIAGNOSIS — M65331 Trigger finger, right middle finger: Secondary | ICD-10-CM | POA: Diagnosis not present

## 2021-05-01 ENCOUNTER — Other Ambulatory Visit: Payer: Self-pay

## 2021-05-01 ENCOUNTER — Ambulatory Visit (INDEPENDENT_AMBULATORY_CARE_PROVIDER_SITE_OTHER): Payer: Medicare PPO

## 2021-05-01 DIAGNOSIS — Z5181 Encounter for therapeutic drug level monitoring: Secondary | ICD-10-CM

## 2021-05-01 DIAGNOSIS — I4891 Unspecified atrial fibrillation: Secondary | ICD-10-CM | POA: Diagnosis not present

## 2021-05-01 LAB — POCT INR: INR: 1.9 — AB (ref 2.0–3.0)

## 2021-05-01 NOTE — Patient Instructions (Signed)
Take 1.5 of the '4mg'$  tablets and then resume  '5mg'$  daily, except for the '4mg'$  on Mondays, Wednesdays and Fridays . INR 3 weeks. [facility] [phone] Main [phone]

## 2021-05-06 DIAGNOSIS — M79641 Pain in right hand: Secondary | ICD-10-CM | POA: Diagnosis not present

## 2021-05-20 ENCOUNTER — Other Ambulatory Visit: Payer: Self-pay

## 2021-05-20 ENCOUNTER — Ambulatory Visit (INDEPENDENT_AMBULATORY_CARE_PROVIDER_SITE_OTHER): Payer: Medicare PPO

## 2021-05-20 DIAGNOSIS — Z5181 Encounter for therapeutic drug level monitoring: Secondary | ICD-10-CM | POA: Diagnosis not present

## 2021-05-20 DIAGNOSIS — I4891 Unspecified atrial fibrillation: Secondary | ICD-10-CM

## 2021-05-20 DIAGNOSIS — M25641 Stiffness of right hand, not elsewhere classified: Secondary | ICD-10-CM | POA: Diagnosis not present

## 2021-05-20 LAB — POCT INR: INR: 2.8 (ref 2.0–3.0)

## 2021-05-20 NOTE — Patient Instructions (Signed)
resume  5mg  daily, except for the 4mg  on Mondays, Wednesdays and Fridays . INR 3 weeks. [facility] [phone] Main [phone]; Dental procedure 10/5

## 2021-05-27 ENCOUNTER — Other Ambulatory Visit: Payer: Self-pay | Admitting: Internal Medicine

## 2021-05-27 DIAGNOSIS — Z1231 Encounter for screening mammogram for malignant neoplasm of breast: Secondary | ICD-10-CM

## 2021-05-30 DIAGNOSIS — M1711 Unilateral primary osteoarthritis, right knee: Secondary | ICD-10-CM | POA: Diagnosis not present

## 2021-06-03 ENCOUNTER — Other Ambulatory Visit: Payer: Self-pay | Admitting: Cardiology

## 2021-06-03 DIAGNOSIS — G4733 Obstructive sleep apnea (adult) (pediatric): Secondary | ICD-10-CM | POA: Diagnosis not present

## 2021-06-03 NOTE — Telephone Encounter (Signed)
Prescription refill request received for warfarin Lov: 11/15/20 Radford Pax)  Next INR check: 06/06/21 Warfarin tablet strength: 5mg   Appropriate dose and refill sent to requested pharmacy.

## 2021-06-06 ENCOUNTER — Other Ambulatory Visit: Payer: Self-pay

## 2021-06-06 ENCOUNTER — Ambulatory Visit (INDEPENDENT_AMBULATORY_CARE_PROVIDER_SITE_OTHER): Payer: Medicare PPO

## 2021-06-06 DIAGNOSIS — Z5181 Encounter for therapeutic drug level monitoring: Secondary | ICD-10-CM | POA: Diagnosis not present

## 2021-06-06 DIAGNOSIS — I4891 Unspecified atrial fibrillation: Secondary | ICD-10-CM | POA: Diagnosis not present

## 2021-06-06 DIAGNOSIS — M1711 Unilateral primary osteoarthritis, right knee: Secondary | ICD-10-CM | POA: Diagnosis not present

## 2021-06-06 LAB — POCT INR: INR: 2.2 (ref 2.0–3.0)

## 2021-06-06 NOTE — Patient Instructions (Signed)
resume 5mg  daily, except for the 4mg  on Mondays, Wednesdays and Fridays . INR 6 weeks. [facility] [phone] Main [phone];

## 2021-06-12 DIAGNOSIS — I4891 Unspecified atrial fibrillation: Secondary | ICD-10-CM | POA: Diagnosis not present

## 2021-06-12 DIAGNOSIS — M179 Osteoarthritis of knee, unspecified: Secondary | ICD-10-CM | POA: Diagnosis not present

## 2021-06-12 DIAGNOSIS — D6869 Other thrombophilia: Secondary | ICD-10-CM | POA: Diagnosis not present

## 2021-06-12 DIAGNOSIS — M65331 Trigger finger, right middle finger: Secondary | ICD-10-CM | POA: Diagnosis not present

## 2021-06-12 DIAGNOSIS — G4700 Insomnia, unspecified: Secondary | ICD-10-CM | POA: Diagnosis not present

## 2021-06-12 DIAGNOSIS — Z7984 Long term (current) use of oral hypoglycemic drugs: Secondary | ICD-10-CM | POA: Diagnosis not present

## 2021-06-12 DIAGNOSIS — I10 Essential (primary) hypertension: Secondary | ICD-10-CM | POA: Diagnosis not present

## 2021-06-12 DIAGNOSIS — E1165 Type 2 diabetes mellitus with hyperglycemia: Secondary | ICD-10-CM | POA: Diagnosis not present

## 2021-06-12 DIAGNOSIS — Z23 Encounter for immunization: Secondary | ICD-10-CM | POA: Diagnosis not present

## 2021-06-13 DIAGNOSIS — M1711 Unilateral primary osteoarthritis, right knee: Secondary | ICD-10-CM | POA: Diagnosis not present

## 2021-07-01 ENCOUNTER — Other Ambulatory Visit: Payer: Self-pay

## 2021-07-01 ENCOUNTER — Ambulatory Visit
Admission: RE | Admit: 2021-07-01 | Discharge: 2021-07-01 | Disposition: A | Discharge status: Home / Self Care | Payer: Medicare PPO | Source: Ambulatory Visit | Attending: Internal Medicine | Admitting: Internal Medicine

## 2021-07-01 DIAGNOSIS — Z1231 Encounter for screening mammogram for malignant neoplasm of breast: Secondary | ICD-10-CM | POA: Diagnosis not present

## 2021-07-15 ENCOUNTER — Other Ambulatory Visit: Payer: Self-pay

## 2021-07-15 ENCOUNTER — Ambulatory Visit (INDEPENDENT_AMBULATORY_CARE_PROVIDER_SITE_OTHER): Payer: Medicare PPO

## 2021-07-15 DIAGNOSIS — I4891 Unspecified atrial fibrillation: Secondary | ICD-10-CM | POA: Diagnosis not present

## 2021-07-15 DIAGNOSIS — Z5181 Encounter for therapeutic drug level monitoring: Secondary | ICD-10-CM

## 2021-07-15 LAB — POCT INR: INR: 3.3 — AB (ref 2.0–3.0)

## 2021-07-15 NOTE — Patient Instructions (Signed)
-   take 2 mg warfarin tonight, then - resume 5mg  daily, except for the 4mg  on Mondays, Wednesdays and Fridays .  - INR 6 weeks.  [facility] [phone] Main [phone];

## 2021-07-26 ENCOUNTER — Telehealth: Payer: Self-pay | Admitting: Cardiology

## 2021-07-26 DIAGNOSIS — R252 Cramp and spasm: Secondary | ICD-10-CM

## 2021-07-26 DIAGNOSIS — M6281 Muscle weakness (generalized): Secondary | ICD-10-CM

## 2021-07-26 NOTE — Telephone Encounter (Signed)
Pt aware of recommendations and agrees with plan ./cy 

## 2021-07-26 NOTE — Telephone Encounter (Signed)
Pt c/o medication issue:  1. Name of Medication: empagliflozin (JARDIANCE) 10 MG TABS tablet  2. How are you currently taking this medication (dosage and times per day)? As directed   3. Are you having a reaction (difficulty breathing--STAT)? Patient is not sure if the medication is causing her symptoms  4. What is your medication issue? Full body aches, cramps everywhere, fatigue, fingers and toes are numb.  The patient said she has been putting off calling in hopes her symptoms got better but they haven't

## 2021-07-26 NOTE — Telephone Encounter (Signed)
Lm to call back ./cy 

## 2021-07-26 NOTE — Telephone Encounter (Signed)
Patient was returning call 

## 2021-07-26 NOTE — Telephone Encounter (Signed)
Spoke with pt re message and pt has noted symptoms have worsened over the course of 6 months cramping to finger,toes and groin Per pt hurting so bad last night could hardly walk upstairs to go to bed Pt also notes muscle weakness Per pt Crestor was started a little before the Jardiance so not sure if one of these meds is the culprit Will forward to Dr Radford Pax for review and recommendations ./cy

## 2021-07-29 ENCOUNTER — Other Ambulatory Visit: Payer: Self-pay

## 2021-07-29 ENCOUNTER — Other Ambulatory Visit: Payer: Medicare PPO | Admitting: *Deleted

## 2021-07-29 DIAGNOSIS — M6281 Muscle weakness (generalized): Secondary | ICD-10-CM | POA: Diagnosis not present

## 2021-07-29 DIAGNOSIS — R252 Cramp and spasm: Secondary | ICD-10-CM | POA: Diagnosis not present

## 2021-07-29 LAB — BASIC METABOLIC PANEL
BUN/Creatinine Ratio: 19 (ref 12–28)
BUN: 23 mg/dL (ref 8–27)
CO2: 23 mmol/L (ref 20–29)
Calcium: 9.3 mg/dL (ref 8.7–10.3)
Chloride: 103 mmol/L (ref 96–106)
Creatinine, Ser: 1.19 mg/dL — ABNORMAL HIGH (ref 0.57–1.00)
Glucose: 211 mg/dL — ABNORMAL HIGH (ref 70–99)
Potassium: 4.7 mmol/L (ref 3.5–5.2)
Sodium: 139 mmol/L (ref 134–144)
eGFR: 48 mL/min/{1.73_m2} — ABNORMAL LOW (ref >59)

## 2021-07-29 LAB — MAGNESIUM: Magnesium: 2.1 mg/dL (ref 1.6–2.3)

## 2021-07-30 ENCOUNTER — Encounter: Payer: Self-pay | Admitting: Cardiology

## 2021-08-02 ENCOUNTER — Other Ambulatory Visit: Payer: Medicare PPO

## 2021-08-05 DIAGNOSIS — H43813 Vitreous degeneration, bilateral: Secondary | ICD-10-CM | POA: Diagnosis not present

## 2021-08-05 DIAGNOSIS — E119 Type 2 diabetes mellitus without complications: Secondary | ICD-10-CM | POA: Diagnosis not present

## 2021-08-05 DIAGNOSIS — H5310 Unspecified subjective visual disturbances: Secondary | ICD-10-CM | POA: Diagnosis not present

## 2021-08-05 DIAGNOSIS — H35342 Macular cyst, hole, or pseudohole, left eye: Secondary | ICD-10-CM | POA: Diagnosis not present

## 2021-08-05 DIAGNOSIS — H35373 Puckering of macula, bilateral: Secondary | ICD-10-CM | POA: Diagnosis not present

## 2021-08-05 DIAGNOSIS — Z961 Presence of intraocular lens: Secondary | ICD-10-CM | POA: Diagnosis not present

## 2021-08-05 DIAGNOSIS — H04123 Dry eye syndrome of bilateral lacrimal glands: Secondary | ICD-10-CM | POA: Diagnosis not present

## 2021-08-07 DIAGNOSIS — M13849 Other specified arthritis, unspecified hand: Secondary | ICD-10-CM | POA: Diagnosis not present

## 2021-08-07 DIAGNOSIS — M25641 Stiffness of right hand, not elsewhere classified: Secondary | ICD-10-CM | POA: Diagnosis not present

## 2021-08-07 DIAGNOSIS — Z4789 Encounter for other orthopedic aftercare: Secondary | ICD-10-CM | POA: Diagnosis not present

## 2021-08-14 DIAGNOSIS — H5310 Unspecified subjective visual disturbances: Secondary | ICD-10-CM | POA: Diagnosis not present

## 2021-08-14 DIAGNOSIS — H04123 Dry eye syndrome of bilateral lacrimal glands: Secondary | ICD-10-CM | POA: Diagnosis not present

## 2021-08-27 ENCOUNTER — Other Ambulatory Visit: Payer: Self-pay

## 2021-08-27 ENCOUNTER — Ambulatory Visit (INDEPENDENT_AMBULATORY_CARE_PROVIDER_SITE_OTHER): Payer: Medicare PPO

## 2021-08-27 DIAGNOSIS — I4891 Unspecified atrial fibrillation: Secondary | ICD-10-CM | POA: Diagnosis not present

## 2021-08-27 DIAGNOSIS — Z5181 Encounter for therapeutic drug level monitoring: Secondary | ICD-10-CM

## 2021-08-27 LAB — POCT INR: INR: 3.4 — AB (ref 2.0–3.0)

## 2021-08-27 NOTE — Patient Instructions (Addendum)
-   skip warfarin tonight, then  - since you are on Flagyl TID, cut your 5 mg tabs in 1/2 on Thursday, Saturday and Sunday (continue 4 mg on other days) - Recheck INR on Monday Normal dosage:  5mg  daily, except for the 4mg  on Mondays, Wednesdays and Fridays .   [facility] [phone] Main [phone]

## 2021-09-02 ENCOUNTER — Other Ambulatory Visit: Payer: Self-pay

## 2021-09-02 ENCOUNTER — Ambulatory Visit (INDEPENDENT_AMBULATORY_CARE_PROVIDER_SITE_OTHER): Payer: Medicare PPO | Admitting: *Deleted

## 2021-09-02 DIAGNOSIS — Z5181 Encounter for therapeutic drug level monitoring: Secondary | ICD-10-CM | POA: Diagnosis not present

## 2021-09-02 DIAGNOSIS — I4891 Unspecified atrial fibrillation: Secondary | ICD-10-CM | POA: Diagnosis not present

## 2021-09-02 LAB — POCT INR: INR: 2.7 (ref 2.0–3.0)

## 2021-09-02 NOTE — Patient Instructions (Signed)
Description   Today take 2mg  (1/2 tablet of 4mg ; will finish flagyl Wednesday) then resume taking 5mg  daily except for the 4mg  on Mondays, Wednesdays and Fridays. Recheck INR in 1 week with MD appt. [facility] Clinic [phone] Main [phone]

## 2021-09-12 ENCOUNTER — Other Ambulatory Visit: Payer: Self-pay

## 2021-09-12 ENCOUNTER — Ambulatory Visit (INDEPENDENT_AMBULATORY_CARE_PROVIDER_SITE_OTHER): Payer: Medicare PPO

## 2021-09-12 ENCOUNTER — Encounter: Payer: Self-pay | Admitting: Cardiology

## 2021-09-12 ENCOUNTER — Ambulatory Visit (INDEPENDENT_AMBULATORY_CARE_PROVIDER_SITE_OTHER): Payer: Medicare PPO | Admitting: Cardiology

## 2021-09-12 VITALS — BP 130/72 | HR 83 | Ht 64.5 in | Wt 246.0 lb

## 2021-09-12 DIAGNOSIS — E119 Type 2 diabetes mellitus without complications: Secondary | ICD-10-CM

## 2021-09-12 DIAGNOSIS — I10 Essential (primary) hypertension: Secondary | ICD-10-CM

## 2021-09-12 DIAGNOSIS — I350 Nonrheumatic aortic (valve) stenosis: Secondary | ICD-10-CM | POA: Diagnosis not present

## 2021-09-12 DIAGNOSIS — I4891 Unspecified atrial fibrillation: Secondary | ICD-10-CM | POA: Diagnosis not present

## 2021-09-12 DIAGNOSIS — I4819 Other persistent atrial fibrillation: Secondary | ICD-10-CM | POA: Diagnosis not present

## 2021-09-12 DIAGNOSIS — Z5181 Encounter for therapeutic drug level monitoring: Secondary | ICD-10-CM | POA: Diagnosis not present

## 2021-09-12 LAB — POCT INR: INR: 2.7 (ref 2.0–3.0)

## 2021-09-12 NOTE — Addendum Note (Signed)
Addended by: Antonieta Iba on: 09/12/2021 09:47 AM   Modules accepted: Orders

## 2021-09-12 NOTE — Progress Notes (Signed)
Cardiology Office Note:    Date:  09/12/2021   ID:  Jenna Mcdonald, DOB Sep 16, 1945, MRN [id number]  PCP:  Josetta Huddle, MD  Cardiologist:  Fransico Him, MD    Referring MD: Josetta Huddle, MD   Chief Complaint  Patient presents with   Hypertension   Hyperlipidemia   Atrial Fibrillation   Shortness of Breath   Congestive Heart Failure   Aortic Stenosis    History of Present Illness:    Jenna Mcdonald is a 76 y.o. female with a hx of OSA on BiPAP, HTN, PAF s/p afib ablation 2020, mild AS by echo 08/2016 and morbid obesity.  2D echo 08/2018 showed normal LVF with mild AS and mild MR.   Leane Call 10/04/2020 for DOE showed no ischemia and 2D echo showed normal heart function with diastolic dysfunction and moderate LAE with mild AS and stable from prior echo.   She was seen in afib clinic 10/10/2020 and was noted to have frequent PVCs which were noted at time of stress test and her 2D echo.  She also reported that she had gained over 20lbs in the past year.  She then presented to the ER 10/12/20 with complaints of chest pain and DOE and ruled out with normal hsTrop but did have vascular congestion on CXR but refused Lasix and was discharged home.  She was having bigeminal PVCs at that time. A Zio Patch was ordered to assess PVC load.  She was seen back in afib clinic on 10/15/20 and was complaining of abdominal bloating and was started on Lasix 40mg  daily for 3 days and then taper down to 20mg  daily.   \She underwent right and left heart cath ffor CP and SOB on 10/26/20 showing normal coronary arteries with elevated right heart pressures and PHTN which was felt to be due to CHF, OSA and obesity.  She was having frequent PVCs which were felt to be contributing to her volume overload and was started on Lasix 40mg  daily.    Due to a PVC load of 22% on heart monitor, she was seen by Dr. Rayann Heman and PVCs felt to originate from the outflow tract and ablation not favored. She had  not tolerated BB or Verapamil in the past and Dr. Rayann Heman said there was nothing else to offer.   She is here today for followup and is doing well.  She denies any chest pain or pressure, SOB, DOE, PND, orthopnea, LE edema, dizziness or syncope.She thinks that she had 1 episode of atrial fibrillation a while back that resolved on its own. She is compliant with her meds and is tolerating meds with no SE.     She is doing well with her BiPAP device and thinks that she has gotten used to it.  She tolerates the mask and feels the pressure is adequate.  She denies any significant mouth or nasal dryness or nasal congestion.  She does not think that he snores.   She does not feel very rested in the am as she wakes up to go to the bathroom several times nightly.   Past Medical History:  Diagnosis Date   Anemia    Aortic stenosis    mild by echo 09/2020 with mean AVG 20mmHg   Breast CA ([place])    left   Breast cancer (HCC)    Carotid stenosis    1-39% left   Degenerative disc disease, lumbar    of the knees   Diabetes mellitus  Dysrhythmia    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HCC)    Fatigue    Severe secondary to to gemfibrozil   Gastroesophageal reflux disease    denies   GI bleeding    a. 06/2018: EGD showed multiple nonbleeding duodenal ulcers. 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   Hammertoe    left second toe   Hypercholesteremia    Hypertension    Insomnia    Irritable bowel    Low back pain    Metabolic syndrome    Morbid obesity (HCC)    OSA on CPAP \   bipap   Osteoarthritis    Osteopenia    Persistent atrial fibrillation [facility])    s/p afib ablation - Dr. Rayann Heman 08/2018   Personal history of radiation therapy    Pneumonia    PONV (postoperative nausea and vomiting)    Sigmoid diverticulosis    Thalassemia minor    Vitamin D deficiency     Past Surgical History:  Procedure Laterality Date   ATRIAL FIBRILLATION  ABLATION N/A 09/21/2018   Procedure: ATRIAL FIBRILLATION ABLATION;  Surgeon: Thompson Grayer, MD;  Location: [place] CV LAB;  Service: Cardiovascular;  Laterality: N/A;   BIOPSY  07/06/2018   Procedure: BIOPSY;  Surgeon: Clarene Essex, MD;  Location: [place];  Service: Endoscopy;;   BREAST LUMPECTOMY Left 05/28/2009   BREAST SURGERY Left 10   lumpectomy   CARDIOVERSION N/A 07/07/2018   Procedure: CARDIOVERSION;  Surgeon: [name], MD;  Location[facility] ENDOSCOPY;  Service: Cardiovascular;  Laterality: N/A;   CARDIOVERSION N/A 07/14/2018   Procedure: CARDIOVERSION;  Surgeon: Larey Dresser, MD;  Location: [facility] ENDOSCOPY;  Service: Cardiovascular;  Laterality: N/A;   CESAREAN SECTION     COLONOSCOPY WITH PROPOFOL N/A 07/06/2018   Procedure: COLONOSCOPY WITH PROPOFOL;  Surgeon: Clarene Essex, MD;  Location: [place];  Service: Endoscopy;  Laterality: N/A;   CYSTECTOMY  70   pilonidal    DILATION AND CURETTAGE OF UTERUS     ESOPHAGOGASTRODUODENOSCOPY (EGD) WITH PROPOFOL N/A 07/06/2018   Procedure: ESOPHAGOGASTRODUODENOSCOPY (EGD) WITH PROPOFOL;  Surgeon: Clarene Essex, MD;  Location: [place];  Service: Endoscopy;  Laterality: N/A;   EYE SURGERY Bilateral 12/14   cataracts   HOT HEMOSTASIS N/A 07/06/2018   Procedure: HOT HEMOSTASIS (ARGON PLASMA COAGULATION/BICAP);  Surgeon: Clarene Essex, MD;  Location: [place];  Service: Endoscopy;  Laterality: N/A;   MAXIMUM ACCESS (MAS)POSTERIOR LUMBAR INTERBODY FUSION (PLIF) 2 LEVEL N/A 09/22/2013   Procedure: FOR MAXIMUM ACCESS  POSTERIOR LUMBAR INTERBODY FUSION LUMBAR THREE-FOUR FOUR-FIVE;  Surgeon: Eustace Moore, MD;  Location: [place] NEURO ORS;  Service: Neurosurgery;  Laterality: N/A;   OOPHORECTOMY     wedge section not rem   REPLACEMENT TOTAL KNEE Left 11   REPLACEMENT TOTAL KNEE     RIGHT/LEFT HEART CATH AND CORONARY ANGIOGRAPHY N/A 10/26/2020   Procedure: RIGHT/LEFT HEART CATH AND CORONARY ANGIOGRAPHY;  Surgeon: Leonie Man, MD;   Location: [place] CV LAB;  Service: Cardiovascular;  Laterality: N/A;   TEE WITHOUT CARDIOVERSION N/A 07/07/2018   Procedure: TRANSESOPHAGEAL ECHOCARDIOGRAM (TEE);  Surgeon: [name], MD;  Location: [place];  Service: Cardiovascular;  Laterality: N/A;   TEE WITHOUT CARDIOVERSION N/A 09/21/2018   Procedure: TRANSESOPHAGEAL ECHOCARDIOGRAM (TEE);  Surgeon: Acie Fredrickson Wonda Cheng, MD;  Location: [facility] ENDOSCOPY;  Service: Cardiovascular;  Laterality: N/A;   TONSILLECTOMY     TUBAL LIGATION      Current Medications: Current Meds  Medication Sig  Calcium Carbonate-Vitamin D (CALCIUM 600 + D PO) Take 1 tablet by mouth daily.    Cholecalciferol (VITAMIN D3) 25 MCG (1000 UT) CAPS Take by mouth.   diltiazem (CARDIZEM CD) 120 MG 24 hr capsule Take 1 capsule (120 mg total) by mouth daily.   diphenhydramine-acetaminophen (TYLENOL PM) 25-500 MG TABS tablet Take 2 tablets by mouth as needed (sleep).    glimepiride (AMARYL) 2 MG tablet Take 2 mg by mouth daily with breakfast. Patient takes half tab   metFORMIN (GLUCOPHAGE-XR) 500 MG 24 hr tablet Take 500 mg by mouth 2 (two) times daily.    ONETOUCH ULTRA test strip 1 each by Other route daily.   rosuvastatin (CRESTOR) 5 MG tablet Take 1 tablet (5 mg total) by mouth 3 (three) times a week.   valsartan (DIOVAN) 80 MG tablet Take 80 mg by mouth daily.   warfarin (COUMADIN) 4 MG tablet TAKE ON MONDAY, WEDNESDAY AND FRIDAY AS DIRECTED BY THE COUMADIN CLINIC   warfarin (COUMADIN) 5 MG tablet TAKE ON SUNDAY, TUESDAY, THURSDAY, AND SATURDAY OR TAKE AS DIRECTED BY THE COUMADIN CLINIC.     Allergies:   Metoprolol, Jardiance [empagliflozin], Tamoxifen, Tetracyclines & related, Toprol xl [metoprolol tartrate], Verapamil, Vicodin [hydrocodone-acetaminophen], Aromasin [exemestane], Gemfibrozil, and Other   Social History   Socioeconomic History   Marital status: Widowed    Spouse name: Not on file   Number of children: Not on file   Years of education:  Not on file   Highest education level: Not on file  Occupational History   Not on file  Tobacco Use   Smoking status: Former    Packs/day: 0.50    Years: 10.00    Pack years: 5.00    Types: Cigarettes    Quit date: 11/12/2003    Years since quitting: 17.8   Smokeless tobacco: Never  Vaping Use   Vaping Use: Never used  Substance and Sexual Activity   Alcohol use: Yes    Comment: occasionally   Drug use: No   Sexual activity: Not Currently    Birth control/protection: Post-menopausal  Other Topics Concern   Not on file  Social History Narrative   Not on file   Social Determinants of Health   Financial Resource Strain: Not on file  Food Insecurity: Not on file  Transportation Needs: Not on file  Physical Activity: Not on file  Stress: Not on file  Social Connections: Not on file     Family History: The patient's family history includes Anesthesia problems in her mother; Diabetes in her father and mother.  ROS:   Please see the history of present illness.    ROS  All other systems reviewed and negative.   EKGs/Labs/Other Studies Reviewed:    The following studies were reviewed today: PAP compliance download  [facility]eva Holes 10/04/2020 Study Highlights    The left ventricular ejection fraction is moderately decreased (30-44%). Nuclear stress EF: 38%. There was no ST segment deviation noted during stress. The study is normal. This is a low risk study.   Normal pharmacologic nuclear stress test with no evidence for prior infarct or ischemia. Very frequent PACs and short runs of atrial tachycardia. LVEF appears underestimated, correlation with an echocardiogram is recommended.  2D echo 10/09/2020 IMPRESSIONS   1. Left ventricular ejection fraction, by estimation, is 70 to 75%. The  left ventricle has hyperdynamic function. The left ventricle has no  regional wall motion abnormalities. There is mild left ventricular  hypertrophy. Left ventricular diastolic  parameters are consistent with Grade II diastolic dysfunction  (pseudonormalization).   2. Right ventricular systolic function is normal. The right ventricular  size is normal. There is normal pulmonary artery systolic pressure.   3. Left atrial size was moderately dilated.   4. The mitral valve is normal in structure. Trivial mitral valve  regurgitation. No evidence of mitral stenosis.   5. The aortic valve is grossly normal. Aortic valve regurgitation is not  visualized. Mild aortic valve stenosis.    EKG:  EKG is  not ordered today  Recent Labs: 10/23/2020: B Natriuretic Peptide 192.1; NT-Pro BNP 558; Platelets 178; TSH 4.853 10/26/2020: Hemoglobin 11.2; Hemoglobin 11.2 01/08/2021: ALT 16 07/29/2021: BUN 23; Creatinine, Ser 1.19; Magnesium 2.1; Potassium 4.7; Sodium 139   Recent Lipid Panel    Component Value Date/Time   CHOL 119 01/08/2021 1140   TRIG 189 (H) 01/08/2021 1140   HDL 43 01/08/2021 1140   CHOLHDL 2.8 01/08/2021 1140   CHOLHDL 4.2 10/24/2020 0344   VLDL 46 (H) 10/24/2020 0344   LDLCALC 45 01/08/2021 1140    Physical Exam:    VS:  BP 130/72    Pulse 83    Ht 5' 4.5" (1.638 m)    Wt 246 lb (111.6 kg)    SpO2 98%    BMI 41.57 kg/m     Wt Readings from Last 3 Encounters:  09/12/21 246 lb (111.6 kg)  11/15/20 252 lb (114.3 kg)  11/12/20 252 lb 3.2 oz (114.4 kg)    GEN: Well nourished, well developed in no acute distress HEENT: Normal NECK: No JVD; No carotid bruits LYMPHATICS: No lymphadenopathy CARDIAC:RRR, no murmurs, rubs, gallops RESPIRATORY:  Clear to auscultation without rales, wheezing or rhonchi  ABDOMEN: Soft, non-tender, non-distended MUSCULOSKELETAL:  No edema; No deformity  SKIN: Warm and dry NEUROLOGIC:  Alert and oriented x 3 PSYCHIATRIC:  Normal affect    ASSESSMENT:    1. Nonrheumatic aortic valve stenosis   2. Essential hypertension   3. Persistent atrial fibrillation ([place])   4. Morbid obesity ([place])   5. Type 2 diabetes mellitus  without complication, without long-term current use of insulin (HCC)    PLAN:    In order of problems listed above:  Aortic stenosis -2D echo 08/2018 showed very mild AS with mean AVG 2mmHg. -repeat echo 10/09/2020 was stable with mean AVG 11.15mmHg -We will repeat echo in 1 year  HTN -BP is controlled on exam today -Continue prescription drug management with Cardizem CD 120 mg daily, Diovan 80 mg daily -I have personally reviewed and interpreted outside labs performed by patient's PCP which showed serum creatinine 1.19 potassium 4.7 on 07/29/2021   PAF -s/p afib ablation 08/2018 -she continues to maintain NSR>>she thinks she had 1 episode of PAF in the past year -continue Cardizem and warfarin -she denies any significant bleeding problems on warfarin -check CBC today  Obesity -I have encouraged her to get into a routine exercise program and cut back on carbs and portions.  -Dr. Rayann Heman [facility]  Type 2 DM -followed by PCP -continue metformin 500mg  BID and Glimepiride -she did not tolerate Jardiance  Chest pain -coronary CTA 08/2018 showing 25-49% LAD stenosis but no CAD on cardiac cath 3/22 -Lexiscan myoview 09/2020 showed no ischemia  PVCs -noted on stress test, echo and EKG -ziopatch showed 22% PVC load -seen by Ep and felt to be of outflow tract origin and EP felt she was not really symptomatic -she has  not tolerated BB or Verapamil in the past and was kept on Cardizem -Continue prescription drug management Cardizem CD 120 mg daily with as needed refills  DOE -suspect this is multifactorial from obesity, sedentary state, PVCs, chronic diastolic CHF -she had elevated filling pressures on cath and restarted on lasix -PHTN noted and multifactorial from OSA, obesity, CHF -She does not appear volume overloaded on exam today -Continue Lasix as needed  Chronic diastolic CHF -she appears euvolemic on exam today -She is only taking Lasix on a as  needed basis -she did not tolerate Jardiance  HLD -LDL goal is less than 100  -I have personally reviewed and interpreted outside PCP performed by patient's PCP which showed LDL 48, HDL 39, triglycerides 235 -I will repeat FLP given her hypertriglyceridemia -Continue prescription drug management with Crestor 5 mg 3 times weekly with as needed refills  OSA - The patient is tolerating PAP therapy well without any problems. The PAP download performed by his DME was personally reviewed and interpreted by me today and showed an AHI of 3.1 /hr on BiPAP 12/8 centimeters H2O with 100% compliance in using more than 4 hours nightly.  The patient has been using and benefiting from PAP use and will continue to benefit from therapy.     Followup with me in 1 year  Medication Adjustments/Labs and Tests Ordered: Current medicines are reviewed at length with the patient today.  Concerns regarding medicines are outlined above.  No orders of the defined types were placed in this encounter.  No orders of the defined types were placed in this encounter.   Signed, Fransico Him, MD  09/12/2021 9:38 AM    [place]

## 2021-09-12 NOTE — Patient Instructions (Signed)
Medication Instructions:  Your physician recommends that you continue on your current medications as directed. Please refer to the Current Medication list given to you today.  *If you need a refill on your cardiac medications before your next appointment, please call your pharmacy*  Lab Work: Fasting lipids, ALT, and CBC If you have labs (blood work) drawn today and your tests are completely normal, you will receive your results only by: [place] (if you have MyChart) OR A paper copy in the mail If you have any lab test that is abnormal or we need to change your treatment, we will call you to review the results.  Testing/Procedures: Your physician has requested that you have an echocardiogram in February 2024. Echocardiography is a painless test that uses sound waves to create images of your heart. It provides your doctor with information about the size and shape of your heart and how well your hearts chambers and valves are working. This procedure takes approximately one hour. There are no restrictions for this procedure.  Follow-Up: At [facility], you and your health needs are our priority.  As part of our continuing mission to provide you with exceptional heart care, we have created designated Provider Care Teams.  These Care Teams include your primary Cardiologist (physician) and Advanced Practice Providers (APPs -  Physician Assistants and Nurse Practitioners) who all work together to provide you with the care you need, when you need it.  Your next appointment:   1 year(s)  The format for your next appointment:   In Person  Provider:   Fransico Him, MD

## 2021-09-12 NOTE — Patient Instructions (Signed)
Description   Continue taking 5mg  daily except for the 4mg  on Mondays, Wednesdays and Fridays. Recheck INR in 4 weeks. Coumadin Clinic [phone] Main [phone]

## 2021-09-16 ENCOUNTER — Other Ambulatory Visit: Payer: Self-pay

## 2021-09-16 ENCOUNTER — Other Ambulatory Visit: Payer: Medicare PPO

## 2021-09-16 DIAGNOSIS — I350 Nonrheumatic aortic (valve) stenosis: Secondary | ICD-10-CM

## 2021-09-16 DIAGNOSIS — K529 Noninfective gastroenteritis and colitis, unspecified: Secondary | ICD-10-CM | POA: Diagnosis not present

## 2021-09-16 DIAGNOSIS — E119 Type 2 diabetes mellitus without complications: Secondary | ICD-10-CM | POA: Diagnosis not present

## 2021-09-16 DIAGNOSIS — I4819 Other persistent atrial fibrillation: Secondary | ICD-10-CM | POA: Diagnosis not present

## 2021-09-16 DIAGNOSIS — R1031 Right lower quadrant pain: Secondary | ICD-10-CM | POA: Diagnosis not present

## 2021-09-16 DIAGNOSIS — I10 Essential (primary) hypertension: Secondary | ICD-10-CM

## 2021-09-17 ENCOUNTER — Other Ambulatory Visit: Payer: Medicare PPO

## 2021-09-17 ENCOUNTER — Telehealth: Payer: Self-pay | Admitting: Cardiology

## 2021-09-17 LAB — LIPID PANEL
Chol/HDL Ratio: 2.9 ratio (ref 0.0–4.4)
Cholesterol, Total: 132 mg/dL (ref 100–199)
HDL: 46 mg/dL (ref >39)
LDL Chol Calc (NIH): 52 mg/dL (ref 0–99)
Triglycerides: 210 mg/dL — ABNORMAL HIGH (ref 0–149)
VLDL Cholesterol Cal: 34 mg/dL (ref 5–40)

## 2021-09-17 LAB — CBC
Hematocrit: 36.5 % (ref 34.0–46.6)
Hemoglobin: 10.8 g/dL — ABNORMAL LOW (ref 11.1–15.9)
MCH: 18.3 pg — ABNORMAL LOW (ref 26.6–33.0)
MCHC: 29.6 g/dL — ABNORMAL LOW (ref 31.5–35.7)
MCV: 62 fL — ABNORMAL LOW (ref 79–97)
Platelets: 223 10*3/uL (ref 150–450)
RBC: 5.91 x10E6/uL — ABNORMAL HIGH (ref 3.77–5.28)
RDW: 18.9 % — ABNORMAL HIGH (ref 11.7–15.4)
WBC: 6.3 10*3/uL (ref 3.4–10.8)

## 2021-09-17 LAB — ALT: ALT: 15 IU/L (ref 0–32)

## 2021-09-17 NOTE — Telephone Encounter (Signed)
Patient was returning call for results. Please advise °

## 2021-09-18 NOTE — Telephone Encounter (Signed)
[name], RPH-CPP  09/17/2021  4:13 PM EST     LDL excellent, TG mildly elevated. She does have DM. Would encourage her to limit intake of carbs, sugar and alcohol to help control TG. Can consider adding on Vascepa 2g BID if she's interested - provides about 30% TG reduction as well as CV risk reduction. Copay would be $40/1 month or $80/3 month with her current Medicare state health plan. She should continue on her rosuvastatin.  The patient has been notified of the result and verbalized understanding.  All questions (if any) were answered. Antonieta Iba, RN 09/18/2021 11:45 AM  Patient does not want to try Vascepa. She states that she will work on her diet.

## 2021-09-18 NOTE — Telephone Encounter (Signed)
Patient is following up, again requesting to review lab results.

## 2021-10-01 ENCOUNTER — Encounter: Payer: Self-pay | Admitting: Cardiology

## 2021-10-01 ENCOUNTER — Telehealth: Payer: Self-pay

## 2021-10-01 ENCOUNTER — Other Ambulatory Visit: Payer: Self-pay | Admitting: Cardiology

## 2021-10-01 MED ORDER — WARFARIN SODIUM 4 MG PO TABS: ORAL_TABLET | ORAL | 1 refills | Status: DC

## 2021-10-01 NOTE — Telephone Encounter (Signed)
Patient with diagnosis of afib on warfarin for anticoagulation.    Procedure: APICOECTOMY, WITH POSSIBLE BONE GRAFT? Date of procedure: 10/03/21  CHA2DS2-VASc Score = 7  This indicates a 11.2% annual risk of stroke. The patient's score is based upon: CHF History: 1 HTN History: 1 Diabetes History: 1 Stroke History: 0 Vascular Disease History: 1 Age Score: 2 Gender Score: 1   CrCl 15mL/min using adjusted body weight Platelet count 223K  Patient does not require pre-op antibiotics for dental procedure.  Per office protocol, patient can hold warfarin for 1 day prior to procedure as requested.

## 2021-10-01 NOTE — Addendum Note (Signed)
Addended by: Brynda Peon on: 10/01/2021 10:32 AM   Modules accepted: Orders

## 2021-10-01 NOTE — Telephone Encounter (Signed)
Pt called into office states she is going to have a dental procedure on Thursday 10/03/21, pt wanted to know if she needed to hold her Warfarin.  Advised pt she would need to called the dentist office doing the procedure to find out if they want her to hold Warfarin, since they would know the bleeding risk associated with the procedure.  If pt needs to hold Warfarin they will need to fax a clearance to Dr Radford Pax at [phone], so pt can be cleared to hold Warfarin if necessary. Pt verbalized understanding Pt also states she was started on Doxycycline 100mg  QD x 30 days on 09/30/21 for abscessed tooth.  Discussed with Fuller Canada, PharmD since pt is only taking Doxycycline once daily and has an appt already on 10/08/21 for INR check, will have her keep that follow-up appt, Doxycycline can interact with Warfarin, but since it is only once daily the interaction is usually less clinically significant. Pt is going to call back after she speaks with the dentist office to see if she needs to hold Warfarin prior to procedure and is also going to inquire to see if she needs an INR prior to procedure on 10/02/21. Will await pt call back.

## 2021-10-01 NOTE — Telephone Encounter (Signed)
Pt left VM, returned call to pt.  Pt states the endodontist wants pt to hold Warfarin x 3 days, starting today 2/7, 2/8 and 2/9 for procedure on 2/9.  Pt states this is an emergency procedure, she saw Dr Sue Lush yesterday and he does not want to leave the upper tooth in her mouth. Thursday was the only day this week he could do it and the pt states she will be out of town next week.  Pt states Dr Carmine Savoy office was going to fax a clearance over to our office.  Verified fax is on Carol's desk, clearance note will be started, will await cardiac and anticoagulation clearance.

## 2021-10-01 NOTE — Telephone Encounter (Signed)
° °  Pre-operative Risk Assessment    Patient Name: Jenna Mcdonald  DOB: 11-24-1945 MRN: [id number]     Request for Surgical Clearance    Procedure:   APICOECTOMY, WITH POSSIBLE BONE GRAFT?  Date of Surgery:  Clearance 10/03/21                                 Surgeon:  DR. Heloise Purpura, DDS Surgeon's Group or Practice Name:   Phone number:  [phone] Fax number:  [phone]   Type of Clearance Requested:   - Medical  - Pharmacy:  Hold Warfarin (Coumadin) x 1 DAY PRIOR; THIS WAS CONFIRMED THE NOTES THAT WAS SENT OVER BY DR. Sue Lush TODAY AND CONFIRMED BY PHONE WITH HIS OFFICE   Type of Anesthesia:  Local    Additional requests/questions:    Jiles Prows   10/01/2021, 4:20 PM

## 2021-10-01 NOTE — Telephone Encounter (Signed)
° °  Name: Jenna Mcdonald  DOB: 02/18/1946  MRN: [id number]   Primary Cardiologist: Fransico Him, MD  Chart reviewed as part of pre-operative protocol coverage. Patient was contacted 10/01/2021 in reference to pre-operative risk assessment for pending surgery as outlined below.  Jenna Mcdonald was last seen on 09/12/21 by Dr. Radford Pax. Chart reviewed. Primarily followed for OSA, PAF s/p ablation, PVCs, normal coronaries, dCHF. Echo 09/2020 with EF 70-75%, mild AS. I reached out to patient for update on how she is doing. The patient affirms she has been doing well without any new cardiac symptoms. Therefore, based on ACC/AHA guidelines, the patient would be at acceptable risk for the planned procedure without further cardiovascular testing. The patient was advised that if she develops new symptoms prior to surgery to contact our office to arrange for a follow-up visit, and she verbalized understanding.  As outlined below, our office communicated with Dr. Carmine Savoy office to clarify the amount of time needed off warfarin. Per pharmD, Per office protocol, patient can hold warfarin for 1 day prior to procedure as requested. She does not need pre-op SBE ppx from cardiac standpoint - she does state she is already being treated with doxycycline for tooth infection at the direction of her dental team. She is having some issues with diarrhea which she's had in the past with doxycycline and asked if she can stop this medicine. I directed her to contact Dr. Carmine Savoy office since we did not prescribe the doxycycline. She verbalized understanding.  I will route this recommendation to the requesting party via Epic fax function and remove from pre-op pool. Please call with questions.  Charlie Pitter, PA-C 10/01/2021, 5:20 PM

## 2021-10-08 ENCOUNTER — Ambulatory Visit (INDEPENDENT_AMBULATORY_CARE_PROVIDER_SITE_OTHER): Payer: Medicare PPO

## 2021-10-08 ENCOUNTER — Other Ambulatory Visit: Payer: Self-pay

## 2021-10-08 DIAGNOSIS — I4891 Unspecified atrial fibrillation: Secondary | ICD-10-CM | POA: Diagnosis not present

## 2021-10-08 DIAGNOSIS — M5416 Radiculopathy, lumbar region: Secondary | ICD-10-CM | POA: Diagnosis not present

## 2021-10-08 DIAGNOSIS — M545 Low back pain, unspecified: Secondary | ICD-10-CM | POA: Diagnosis not present

## 2021-10-08 DIAGNOSIS — Z5181 Encounter for therapeutic drug level monitoring: Secondary | ICD-10-CM

## 2021-10-08 LAB — POCT INR: INR: 1.9 — AB (ref 2.0–3.0)

## 2021-10-08 NOTE — Patient Instructions (Signed)
-   take extra 2 mg (1/2 of 4 mg tablet) tonight, then - Continue taking 5mg  daily except for the 4mg  on Mondays, Wednesdays and Fridays.  - Recheck INR in 2 weeks (pt on doxycycline x 30 days after dental procedure on 2/9) [facility] [phone] Main [phone]

## 2021-10-09 ENCOUNTER — Other Ambulatory Visit: Payer: Self-pay | Admitting: Neurological Surgery

## 2021-10-09 DIAGNOSIS — M5416 Radiculopathy, lumbar region: Secondary | ICD-10-CM

## 2021-10-17 DIAGNOSIS — K529 Noninfective gastroenteritis and colitis, unspecified: Secondary | ICD-10-CM | POA: Diagnosis not present

## 2021-10-17 DIAGNOSIS — I10 Essential (primary) hypertension: Secondary | ICD-10-CM | POA: Diagnosis not present

## 2021-10-17 DIAGNOSIS — N1831 Chronic kidney disease, stage 3a: Secondary | ICD-10-CM | POA: Diagnosis not present

## 2021-10-20 ENCOUNTER — Ambulatory Visit
Admission: RE | Admit: 2021-10-20 | Discharge: 2021-10-20 | Disposition: A | Discharge status: Home / Self Care | Payer: Medicare PPO | Source: Ambulatory Visit | Attending: Neurological Surgery | Admitting: Neurological Surgery

## 2021-10-20 ENCOUNTER — Other Ambulatory Visit: Payer: Self-pay

## 2021-10-20 DIAGNOSIS — M2578 Osteophyte, vertebrae: Secondary | ICD-10-CM | POA: Diagnosis not present

## 2021-10-20 DIAGNOSIS — M4316 Spondylolisthesis, lumbar region: Secondary | ICD-10-CM | POA: Diagnosis not present

## 2021-10-20 DIAGNOSIS — M5416 Radiculopathy, lumbar region: Secondary | ICD-10-CM

## 2021-10-20 DIAGNOSIS — M545 Low back pain, unspecified: Secondary | ICD-10-CM | POA: Diagnosis not present

## 2021-10-23 ENCOUNTER — Other Ambulatory Visit: Payer: Self-pay

## 2021-10-23 ENCOUNTER — Ambulatory Visit (INDEPENDENT_AMBULATORY_CARE_PROVIDER_SITE_OTHER): Payer: Medicare PPO | Admitting: *Deleted

## 2021-10-23 DIAGNOSIS — Z5181 Encounter for therapeutic drug level monitoring: Secondary | ICD-10-CM

## 2021-10-23 DIAGNOSIS — I4891 Unspecified atrial fibrillation: Secondary | ICD-10-CM | POA: Diagnosis not present

## 2021-10-23 LAB — POCT INR: INR: 3.4 — AB (ref 2.0–3.0)

## 2021-10-23 NOTE — Patient Instructions (Addendum)
?  Description   ?- Hold warfarin today ?- Then continue taking 5mg  daily except for the 4mg  on Mondays, Wednesdays and Fridays.  ?- Recheck INR in 10 days (pt on doxycycline x 30 days after dental procedure on 2/9). Pt should complete doxy by 3/8 ?[facility] [phone] Main [phone] ?  ?  ?

## 2021-10-24 ENCOUNTER — Encounter: Payer: Self-pay | Admitting: Physical Therapy

## 2021-10-24 ENCOUNTER — Ambulatory Visit: Payer: Medicare PPO | Attending: Neurological Surgery | Admitting: Physical Therapy

## 2021-10-24 DIAGNOSIS — R262 Difficulty in walking, not elsewhere classified: Secondary | ICD-10-CM | POA: Insufficient documentation

## 2021-10-24 DIAGNOSIS — M25561 Pain in right knee: Secondary | ICD-10-CM | POA: Diagnosis not present

## 2021-10-24 DIAGNOSIS — M545 Low back pain, unspecified: Secondary | ICD-10-CM | POA: Diagnosis not present

## 2021-10-24 DIAGNOSIS — M62830 Muscle spasm of back: Secondary | ICD-10-CM | POA: Diagnosis not present

## 2021-10-24 NOTE — Therapy (Signed)
[place] [place], [place], [address] Phone: [phone]   Fax:  [phone]  Physical Therapy Evaluation  Patient Details  Name: Jenna Mcdonald MRN: [id number] Date of Birth: [birth year] Referring Provider (PT): Eustace Moore   Encounter Date: 10/24/2021   PT End of Session - 10/24/21 1618     Visit Number 1    Number of Visits 13    Date for PT Re-Evaluation 12/30/21    Authorization Type [employer]    PT Start Time 1525    PT Stop Time 1608    PT Time Calculation (min) 43 min    Activity Tolerance Patient limited by pain    Behavior During Therapy [facility] for tasks assessed/performed             Past Medical History:  Diagnosis Date   Anemia    Aortic stenosis    mild by echo 09/2020 with mean AVG 22mmHg   Breast CA ([place])    left   Breast cancer ([place])    Carotid stenosis    1-39% left   Degenerative disc disease, lumbar    of the knees   Diabetes mellitus    Dysrhythmia    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[place])    Fatigue    Severe secondary to to gemfibrozil   Gastroesophageal reflux disease    denies   GI bleeding    a. 06/2018: EGD showed multiple nonbleeding duodenal ulcers. 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   Hammertoe    left second toe   Hypercholesteremia    Hypertension    Insomnia    Irritable bowel    Low back pain    Metabolic syndrome    Morbid obesity (HCC)    OSA on CPAP \   bipap   Osteoarthritis    Osteopenia    Persistent atrial fibrillation [facility])    s/p afib ablation - Dr. Rayann Heman 08/2018   Personal history of radiation therapy    Pneumonia    PONV (postoperative nausea and vomiting)    Sigmoid diverticulosis    Thalassemia minor    Vitamin D deficiency     Past Surgical History:  Procedure Laterality Date   ATRIAL FIBRILLATION ABLATION N/A 09/21/2018   Procedure: ATRIAL FIBRILLATION ABLATION;   Surgeon: Thompson Grayer, MD;  Location: [place] CV LAB;  Service: Cardiovascular;  Laterality: N/A;   BIOPSY  07/06/2018   Procedure: BIOPSY;  Surgeon: Clarene Essex, MD;  Location: [place];  Service: Endoscopy;;   BREAST LUMPECTOMY Left 05/28/2009   BREAST SURGERY Left 10   lumpectomy   CARDIOVERSION N/A 07/07/2018   Procedure: CARDIOVERSION;  Surgeon: [name], MD;  Location: [facility] ENDOSCOPY;  Service: Cardiovascular;  Laterality: N/A;   CARDIOVERSION N/A 07/14/2018   Procedure: CARDIOVERSION;  Surgeon: Larey Dresser, MD;  Location: [facility] ENDOSCOPY;  Service: Cardiovascular;  Laterality: N/A;   CESAREAN SECTION     COLONOSCOPY WITH PROPOFOL N/A 07/06/2018   Procedure: COLONOSCOPY WITH PROPOFOL;  Surgeon: Clarene Essex, MD;  Location: [place];  Service: Endoscopy;  Laterality: N/A;   CYSTECTOMY  70   pilonidal    DILATION AND CURETTAGE OF UTERUS     ESOPHAGOGASTRODUODENOSCOPY (EGD) WITH PROPOFOL N/A 07/06/2018   Procedure: ESOPHAGOGASTRODUODENOSCOPY (EGD) WITH PROPOFOL;  Surgeon: Clarene Essex, MD;  Location: [place];  Service: Endoscopy;  Laterality: N/A;   EYE SURGERY Bilateral 12/14   cataracts   HOT HEMOSTASIS N/A  07/06/2018   Procedure: HOT HEMOSTASIS (ARGON PLASMA COAGULATION/BICAP);  Surgeon: Clarene Essex, MD;  Location: [place];  Service: Endoscopy;  Laterality: N/A;   MAXIMUM ACCESS (MAS)POSTERIOR LUMBAR INTERBODY FUSION (PLIF) 2 LEVEL N/A 09/22/2013   Procedure: FOR MAXIMUM ACCESS  POSTERIOR LUMBAR INTERBODY FUSION LUMBAR THREE-FOUR FOUR-FIVE;  Surgeon: Eustace Moore, MD;  Location: [place] NEURO ORS;  Service: Neurosurgery;  Laterality: N/A;   OOPHORECTOMY     wedge section not rem   REPLACEMENT TOTAL KNEE Left 11   REPLACEMENT TOTAL KNEE     RIGHT/LEFT HEART CATH AND CORONARY ANGIOGRAPHY N/A 10/26/2020   Procedure: RIGHT/LEFT HEART CATH AND CORONARY ANGIOGRAPHY;  Surgeon: Leonie Man, MD;  Location: [facility] CV LAB;  Service: Cardiovascular;   Laterality: N/A;   TEE WITHOUT CARDIOVERSION N/A 07/07/2018   Procedure: TRANSESOPHAGEAL ECHOCARDIOGRAM (TEE);  Surgeon: [name], MD;  Location: [place];  Service: Cardiovascular;  Laterality: N/A;   TEE WITHOUT CARDIOVERSION N/A 09/21/2018   Procedure: TRANSESOPHAGEAL ECHOCARDIOGRAM (TEE);  Surgeon: Acie Fredrickson Wonda Cheng, MD;  Location: [facility] ENDOSCOPY;  Service: Cardiovascular;  Laterality: N/A;   TONSILLECTOMY     TUBAL LIGATION      There were no vitals filed for this visit.    Subjective Assessment - 10/24/21 1528     Subjective Patient has a history of 2 level lumbar fusion 2015, left TKA 2011.  Reports that over the past 6 months she feels that she really had increase low back pain and right knee pain.  She reports that she has had more and more difficulty walking and feels that her arms and legs are very weak.  She had a recent MRI that showed some degenerative changes above and below the fusion but not with significant stenosis    Limitations Lifting;Standing;Walking;House hold activities    How long can you stand comfortably? 5 minutes    How long can you walk comfortably? 2 minutes    Patient Stated Goals less pain, walk better, feel stronger    Currently in Pain? Yes    Pain Score 5     Pain Location Back    Pain Orientation Right;Mid;Lower    Pain Descriptors / Indicators Sharp    Pain Type Acute pain    Pain Radiating Towards right knee pain    Pain Onset More than a month ago    Pain Frequency Constant    Aggravating Factors  walking, stairs, activity pain up 9-10/10    Pain Relieving Factors OTC pain meds, heat at best pain a 4-5/10    Effect of Pain on Daily Activities limits everything                [facility] PT Assessment - 10/24/21 0001       Assessment   Medical Diagnosis LBP, right knee pain    Referring Provider (PT) Eustace Moore    Onset Date/Surgical Date 09/26/21    Prior Therapy nothing in the past 7 years      Precautions   Precautions  None      Balance Screen   Has the patient fallen in the past 6 months No    Has the patient had a decrease in activity level because of a fear of falling?  Yes    Is the patient reluctant to leave their home because of a fear of falling?  No      Home Environment   Additional Comments does houseowrk, last year was able to do her own yardwork,  has stairs      Prior Function   Level of Independence Independent    Vocation Retired    Leisure no exercise      Posture/Postural Control   Posture Comments fwd head, rounded shoulders      ROM / Strength   AROM / PROM / Strength AROM;Strength      AROM   Overall AROM Comments Lumbar ROM decreased 25% for flexion, decreased 100% for sidebending and extension    AROM Assessment Site Knee    Right/Left Knee Right    Right Knee Extension 5    Right Knee Flexion 95      Strength   Overall Strength Comments hips 3+/5 with pain in the thighs and the back and knees, knees 4-/5 with pain in the knees      Flexibility   Soft Tissue Assessment /Muscle Length yes    Hamstrings very tight    Quadriceps tight    ITB very tight    Piriformis very tight      Palpation   Patella mobility some crepitus in the right knee tender in the right knee,    Palpation comment very tender in the right lumbar area, tight with spasms in this area, tender in the bilateral buttocks      Standardized Balance Assessment   Standardized Balance Assessment Timed Up and Go Test      Timed Up and Go Test   Normal TUG (seconds) 24    TUG Comments no device                        Objective measurements completed on examination: See above findings.                  PT Short Term Goals - 10/24/21 1643       PT SHORT TERM GOAL #1   Title independent with initial HEP    Time 2    Period Weeks    Status New               PT Long Term Goals - 10/24/21 1643       PT LONG TERM GOAL #1   Title report pain decreased 25%     Time 12    Period Weeks    Status New      PT LONG TERM GOAL #2   Title increase lumbar ROM 25%    Time 12    Period Weeks    Status New      PT LONG TERM GOAL #3   Title report tolerance to standing at 10 minutes to cook a meal    Time 12    Period Weeks    Status New      PT LONG TERM GOAL #4   Title report that she can walk 10 minutes    Time 12    Period Weeks    Status New      PT LONG TERM GOAL #5   Title increase LE strength to 4+/5    Time 12    Period Weeks    Status New                    Plan - 10/24/21 1554     Clinical Impression Statement Patient has a history of lumbar fusion L3-L5, has a left TKA, she reports that over the past 6 months she has had significant increase of LBP, right knee  pain. she reports much more difficulty walking and doing ADL's.  She reports cramps in the legs much of the time.  She has limited right knee motions and severely limited lumbar ROM.  Her TUG time was 24 seconds putting her at an increased risk for falls.  She is very tight in the LE mms, and has spasms in the lumbar and buttocks    Stability/Clinical Decision Making Evolving/Moderate complexity    Clinical Decision Making Moderate    Rehab Potential Good    PT Frequency 2x / week    PT Duration 12 weeks    PT Treatment/Interventions ADLs/Self Care Home Management;Cryotherapy;Electrical Stimulation;Moist Heat;Gait training;Stair training;Functional mobility training;Iontophoresis 4mg /ml Dexamethasone;Therapeutic activities;Therapeutic exercise;Balance training;Neuromuscular re-education;Manual techniques;Patient/family education;Dry needling    PT Home Exercise Plan Access Code: [email or web address]  URL: [email or web address]  Date: 10/24/2021  Prepared by: Lum Babe    Exercises  Hooklying Single Knee to Chest - 1 x daily - 7 x weekly - 2 sets - 10 reps - 10 hold  Supine Double Knee to Chest - 1 x daily - 7 x weekly - 2 sets - 10 reps - 10 hold  Supine Lower  Trunk Rotation - 1 x daily - 7 x weekly - 2 sets - 10 reps - 10 hold  Supine Posterior Pelvic Tilt - 1 x daily - 7 x weekly - 2 sets - 10 reps - 5 hold  Seated March - 1 x daily - 7 x weekly - 2 sets - 10 reps - 1 hold  Seated Long Arc Quad - 1 x daily - 7 x weekly - 2 sets - 10 reps - 1 hold  Seated Ankle Dorsiflexion AROM - 1 x daily - 7 x weekly - 2 sets - 10 reps - 1 hold  Seated Heel Raise - 1 x daily - 7 x weekly - 2 sets - 10 reps - 1 hold  Seated Calf Stretch with Strap - 1 x daily - 7 x weekly - 2 sets - 5 reps - 30 hold    Consulted and Agree with Plan of Care Patient             Patient will benefit from skilled therapeutic intervention in order to improve the following deficits and impairments:  Abnormal gait, Decreased range of motion, Difficulty walking, Decreased mobility, Decreased strength, Postural dysfunction, Improper body mechanics, Impaired flexibility, Decreased balance, Pain, Decreased activity tolerance, Increased muscle spasms  Visit Diagnosis: Acute bilateral low back pain without sciatica - Plan: PT plan of care cert/re-cert  Acute pain of right knee - Plan: PT plan of care cert/re-cert  Difficulty in walking, not elsewhere classified - Plan: PT plan of care cert/re-cert  Muscle spasm of back - Plan: PT plan of care cert/re-cert     Problem List Patient Active Problem List   Diagnosis Date Noted   Shortness of breath    Unstable angina ([place]) 10/23/2020   Secondary hypercoagulable state ([place]) 10/15/2020   Encounter for therapeutic drug monitoring 08/17/2018   CKD (chronic kidney disease) stage 3, GFR 30-59 ml/min (HCC) 07/21/2018   Chronic anemia 07/21/2018   Anemia    Heme positive stool    Atrial fibrillation with RVR ([place]) 07/04/2018   Carotid stenosis    Bruit of left carotid artery 09/22/2017   Aortic stenosis    Heart murmur 09/09/2016   Morbid obesity ([place]) 02/05/2016   Dry eye 05/17/2015   Type 2 diabetes mellitus without complication ([place])  29/93/7169   Breast cancer,  left breast ([place]) 07/22/2014   Obesity, Class III, BMI 40-49.9 (morbid obesity) ([place]) 07/06/2014   Macular pseudohole 05/17/2014   History of surgical procedure 05/17/2014   OSA (obstructive sleep apnea) 11/17/2013   S/P lumbar spinal fusion 09/22/2013   Persistent atrial fibrillation ([place]) 09/11/2011    Class: Acute   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[place]) 09/11/2011    Class: Chronic   Essential hypertension 09/11/2011   Exogenous obesity 09/11/2011   Gastroesophageal reflux disease 09/11/2011   Thalassemia minor 09/11/2011    Class: Chronic   Breast cancer ([place]) 09/11/2011    Class: Chronic   Degenerative joint disease 09/11/2011    Class: Chronic   Irritable bowel disease 09/11/2011    Class: Chronic   Lumbar disc disease 09/11/2011    Sumner Boast, PT 10/24/2021, 4:47 PM  [address] [place] [place], [place], [address] Phone: [phone]   Fax:  [phone]  Name: Breauna Mazzeo MRN: [id number] Date of Birth: [birth year]

## 2021-10-24 NOTE — Patient Instructions (Signed)
Access Code: [email or web address] ?URL: [email or web address] ?Date: 10/24/2021 ?Prepared by: Lum Babe ? ?Exercises ?Hooklying Single Knee to Chest - 1 x daily - 7 x weekly - 2 sets - 10 reps - 10 hold ?Supine Double Knee to Chest - 1 x daily - 7 x weekly - 2 sets - 10 reps - 10 hold ?Supine Lower Trunk Rotation - 1 x daily - 7 x weekly - 2 sets - 10 reps - 10 hold ?Supine Posterior Pelvic Tilt - 1 x daily - 7 x weekly - 2 sets - 10 reps - 5 hold ?Seated March - 1 x daily - 7 x weekly - 2 sets - 10 reps - 1 hold ?Seated Long Arc Quad - 1 x daily - 7 x weekly - 2 sets - 10 reps - 1 hold ?Seated Ankle Dorsiflexion AROM - 1 x daily - 7 x weekly - 2 sets - 10 reps - 1 hold ?Seated Heel Raise - 1 x daily - 7 x weekly - 2 sets - 10 reps - 1 hold ?Seated Calf Stretch with Strap - 1 x daily - 7 x weekly - 2 sets - 5 reps - 30 hold ? ?URL: [email or web address] ?Date: 10/24/2021 ?Prepared by: Lum Babe ? ?Exercises ?Hooklying Single Knee to Chest - 1 x daily - 7 x weekly - 2 sets - 10 reps - 10 hold ?Supine Double Knee to Chest - 1 x daily - 7 x weekly - 2 sets - 10 reps - 10 hold ?Supine Lower Trunk Rotation - 1 x daily - 7 x weekly - 2 sets - 10 reps - 10 hold ?Supine Posterior Pelvic Tilt - 1 x daily - 7 x weekly - 2 sets - 10 reps - 5 hold ?Seated March - 1 x daily - 7 x weekly - 2 sets - 10 reps - 1 hold ?Seated Long Arc Quad - 1 x daily - 7 x weekly - 2 sets - 10 reps - 1 hold ?Seated Ankle Dorsiflexion AROM - 1 x daily - 7 x weekly - 2 sets - 10 reps - 1 hold ?Seated Heel Raise - 1 x daily - 7 x weekly - 2 sets - 10 reps - 1 hold ?Seated Calf Stretch with Strap - 1 x daily - 7 x weekly - 2 sets - 5 reps - 30 hold ? ?

## 2021-10-26 ENCOUNTER — Other Ambulatory Visit: Payer: Self-pay | Admitting: Cardiology

## 2021-10-29 ENCOUNTER — Encounter: Payer: Self-pay | Admitting: Physical Therapy

## 2021-10-29 ENCOUNTER — Other Ambulatory Visit: Payer: Self-pay

## 2021-10-29 ENCOUNTER — Ambulatory Visit: Payer: Medicare PPO | Admitting: Physical Therapy

## 2021-10-29 DIAGNOSIS — M25561 Pain in right knee: Secondary | ICD-10-CM | POA: Diagnosis not present

## 2021-10-29 DIAGNOSIS — M62830 Muscle spasm of back: Secondary | ICD-10-CM

## 2021-10-29 DIAGNOSIS — M545 Low back pain, unspecified: Secondary | ICD-10-CM | POA: Diagnosis not present

## 2021-10-29 DIAGNOSIS — R262 Difficulty in walking, not elsewhere classified: Secondary | ICD-10-CM

## 2021-10-29 NOTE — Therapy (Signed)
[place]. [place], [place], [address] Phone: [phone]   Fax:  [phone]  Physical Therapy Treatment  Patient Details  Name: Jenna Mcdonald MRN: [id number] Date of Birth: 1946-06-16 Referring Provider (PT): Eustace Moore   Encounter Date: 10/29/2021   PT End of Session - 10/29/21 1542     Visit Number 2    Number of Visits 13    Date for PT Re-Evaluation 12/30/21    Authorization Type [employer]    PT Start Time 9201    PT Stop Time 1528    PT Time Calculation (min) 46 min    Activity Tolerance Patient limited by pain    Behavior During Therapy [facility] for tasks assessed/performed             Past Medical History:  Diagnosis Date   Anemia    Aortic stenosis    mild by echo 09/2020 with mean AVG 65mHg   Breast CA ([place]    left   Breast cancer ([place]    Carotid stenosis    1-39% left   Degenerative disc disease, lumbar    of the knees   Diabetes mellitus    Dysrhythmia    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[place]    Fatigue    Severe secondary to to gemfibrozil   Gastroesophageal reflux disease    denies   GI bleeding    a. 06/2018: EGD showed multiple nonbleeding duodenal ulcers. 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   Hammertoe    left second toe   Hypercholesteremia    Hypertension    Insomnia    Irritable bowel    Low back pain    Metabolic syndrome    Morbid obesity (HCC)    OSA on CPAP \   bipap   Osteoarthritis    Osteopenia    Persistent atrial fibrillation ([facility]    s/p afib ablation - Dr. ARayann Heman1/2020   Personal history of radiation therapy    Pneumonia    PONV (postoperative nausea and vomiting)    Sigmoid diverticulosis    Thalassemia minor    Vitamin D deficiency     Past Surgical History:  Procedure Laterality Date   ATRIAL FIBRILLATION ABLATION N/A 09/21/2018   Procedure: ATRIAL FIBRILLATION ABLATION;   Surgeon: AThompson Grayer MD;  [facility];  Service: Cardiovascular;  Laterality: N/A;   BIOPSY  07/06/2018   Procedure: BIOPSY;  Surgeon: MClarene Essex MD;  [place]: [facility]  Service: Endoscopy;;   BREAST LUMPECTOMY Left 05/28/2009   BREAST SURGERY Left 10   lumpectomy   CARDIOVERSION N/A 07/07/2018   Procedure: CARDIOVERSION;  Surgeon: RSkeet Latch MD;  [facility];  Service: Cardiovascular;  Laterality: N/A;   CARDIOVERSION N/A 07/14/2018   Procedure: CARDIOVERSION;  Surgeon: MLarey Dresser MD;  [facility];  Service: Cardiovascular;  Laterality: N/A;   CESAREAN SECTION     COLONOSCOPY WITH PROPOFOL N/A 07/06/2018   Procedure: COLONOSCOPY WITH PROPOFOL;  Surgeon: MClarene Essex MD;  [place]: [place]  Service: Endoscopy;  Laterality: N/A;   CYSTECTOMY  70   pilonidal    DILATION AND CURETTAGE OF UTERUS     ESOPHAGOGASTRODUODENOSCOPY (EGD) WITH PROPOFOL N/A 07/06/2018   Procedure: ESOPHAGOGASTRODUODENOSCOPY (EGD) WITH PROPOFOL;  Surgeon: MClarene Essex MD;  Location: [facility]  Service: Endoscopy;  Laterality: N/A;   EYE SURGERY Bilateral 12/14   cataracts   HOT HEMOSTASIS N/A  07/06/2018   Procedure: HOT HEMOSTASIS (ARGON PLASMA COAGULATION/BICAP);  Surgeon: Clarene Essex, MD;  Location: [place];  Service: Endoscopy;  Laterality: N/A;   MAXIMUM ACCESS (MAS)POSTERIOR LUMBAR INTERBODY FUSION (PLIF) 2 LEVEL N/A 09/22/2013   Procedure: FOR MAXIMUM ACCESS  POSTERIOR LUMBAR INTERBODY FUSION LUMBAR THREE-FOUR FOUR-FIVE;  Surgeon: Eustace Moore, MD;  Location: [place] NEURO ORS;  Service: Neurosurgery;  Laterality: N/A;   OOPHORECTOMY     wedge section not rem   REPLACEMENT TOTAL KNEE Left 11   REPLACEMENT TOTAL KNEE     RIGHT/LEFT HEART CATH AND CORONARY ANGIOGRAPHY N/A 10/26/2020   Procedure: RIGHT/LEFT HEART CATH AND CORONARY ANGIOGRAPHY;  Surgeon: Leonie Man, MD;  Location: [place] CV LAB;  Service: Cardiovascular;   Laterality: N/A;   TEE WITHOUT CARDIOVERSION N/A 07/07/2018   Procedure: TRANSESOPHAGEAL ECHOCARDIOGRAM (TEE);  Surgeon: [name], MD;  Location: [place];  Service: Cardiovascular;  Laterality: N/A;   TEE WITHOUT CARDIOVERSION N/A 09/21/2018   Procedure: TRANSESOPHAGEAL ECHOCARDIOGRAM (TEE);  Surgeon: Acie Fredrickson Wonda Cheng, MD;  Location: [facility];  Service: Cardiovascular;  Laterality: N/A;   TONSILLECTOMY     TUBAL LIGATION      There were no vitals filed for this visit.   Subjective Assessment - 10/29/21 1454     Subjective the hip and back is feeling better and I am having less cramps    Currently in Pain? Yes    Pain Score 6     Pain Location Back    Pain Orientation Right;Mid;Lower                               [facility] Adult PT Treatment/Exercise - 10/29/21 0001       Exercises   Exercises Lumbar      Lumbar Exercises: Aerobic   Nustep level 3 x 4 minutes      Lumbar Exercises: Seated   Other Seated Lumbar Exercises green tband rows and extension 2x10    Other Seated Lumbar Exercises ball b/n knees , clams green tband, HS curl green tband, green tband ankle PF/DF 2x10 each      Lumbar Exercises: Supine   Other Supine Lumbar Exercises butterfly stretch with help    Other Supine Lumbar Exercises feet on ball K2C, trunk rotation, isometric abs                       PT Short Term Goals - 10/29/21 1607       PT SHORT TERM GOAL #1   Title independent with initial HEP    Status Partially Met               PT Long Term Goals - 10/24/21 1643       PT LONG TERM GOAL #1   Title report pain decreased 25%    Time 12    Period Weeks    Status New      PT LONG TERM GOAL #2   Title increase lumbar ROM 25%    Time 12    Period Weeks    Status New      PT LONG TERM GOAL #3   Title report tolerance to standing at 10 minutes to cook a meal    Time 12    Period Weeks    Status New      PT LONG TERM GOAL #4   Title  report that she can walk 10  minutes    Time 12    Period Weeks    Status New      PT LONG TERM GOAL #5   Title increase LE strength to 4+/5    Time 12    Period Weeks    Status New                   Plan - 10/29/21 1543     Clinical Impression Statement Patient reports that she feels better than the first day, she was leary of doing exercises but once we got going she did well, did have some pain and tightness, tends to get cramps in the adductors, very tight adductors, HS and calves.  needed verbal, tactile and visual cues to get abdominal activation    PT Next Visit Plan slowly try to progress movements and function, she tends to have pain with most activities and have cramps in the legs    Consulted and Agree with Plan of Care Patient             Patient will benefit from skilled therapeutic intervention in order to improve the following deficits and impairments:  Abnormal gait, Decreased range of motion, Difficulty walking, Decreased mobility, Decreased strength, Postural dysfunction, Improper body mechanics, Impaired flexibility, Decreased balance, Pain, Decreased activity tolerance, Increased muscle spasms  Visit Diagnosis: Acute bilateral low back pain without sciatica  Acute pain of right knee  Difficulty in walking, not elsewhere classified  Muscle spasm of back     Problem List Patient Active Problem List   Diagnosis Date Noted   Shortness of breath    Unstable angina ([place]) 10/23/2020   Secondary hypercoagulable state ([place]) 10/15/2020   Encounter for therapeutic drug monitoring 08/17/2018   CKD (chronic kidney disease) stage 3, GFR 30-59 ml/min (HCC) 07/21/2018   Chronic anemia 07/21/2018   Anemia    Heme positive stool    Atrial fibrillation with RVR ([place]) 07/04/2018   Carotid stenosis    Bruit of left carotid artery 09/22/2017   Aortic stenosis    Heart murmur 09/09/2016   Morbid obesity ([place]) 02/05/2016   Dry eye 05/17/2015   Type 2  diabetes mellitus without complication ([place]) 87/68/1157   Breast cancer, left breast ([place]) 07/22/2014   Obesity, Class III, BMI 40-49.9 (morbid obesity) ([place]) 07/06/2014   Macular pseudohole 05/17/2014   History of surgical procedure 05/17/2014   OSA (obstructive sleep apnea) 11/17/2013   S/P lumbar spinal fusion 09/22/2013   Persistent atrial fibrillation ([place]) 09/11/2011    Class: Acute   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[place]) 09/11/2011    Class: Chronic   Essential hypertension 09/11/2011   Exogenous obesity 09/11/2011   Gastroesophageal reflux disease 09/11/2011   Thalassemia minor 09/11/2011    Class: Chronic   Breast cancer ([place]) 09/11/2011    Class: Chronic   Degenerative joint disease 09/11/2011    Class: Chronic   Irritable bowel disease 09/11/2011    Class: Chronic   Lumbar disc disease 09/11/2011    Sumner Boast, PT 10/29/2021, 4:07 PM  [address] [place], [place], [address] Phone: [phone]   Fax:  [phone]  Name: Jenna Mcdonald MRN: [id number] Date of Birth: Apr 22, 1946

## 2021-10-31 ENCOUNTER — Ambulatory Visit (INDEPENDENT_AMBULATORY_CARE_PROVIDER_SITE_OTHER): Payer: Medicare PPO | Admitting: *Deleted

## 2021-10-31 ENCOUNTER — Other Ambulatory Visit: Payer: Self-pay

## 2021-10-31 DIAGNOSIS — I4891 Unspecified atrial fibrillation: Secondary | ICD-10-CM | POA: Diagnosis not present

## 2021-10-31 DIAGNOSIS — M5416 Radiculopathy, lumbar region: Secondary | ICD-10-CM | POA: Diagnosis not present

## 2021-10-31 DIAGNOSIS — M545 Low back pain, unspecified: Secondary | ICD-10-CM | POA: Diagnosis not present

## 2021-10-31 DIAGNOSIS — Z5181 Encounter for therapeutic drug level monitoring: Secondary | ICD-10-CM

## 2021-10-31 LAB — POCT INR: INR: 2 (ref 2.0–3.0)

## 2021-10-31 NOTE — Patient Instructions (Addendum)
Description   ?-Take 7.'5mg'$  of warfarin today ?- Then continue taking '5mg'$  daily except for the '4mg'$  on Mondays, Wednesdays and Fridays.  ?- Recheck INR in 6 weeks, per pt request ?[facility] [phone] Main [phone] ?  ?  ?

## 2021-11-07 DIAGNOSIS — M25551 Pain in right hip: Secondary | ICD-10-CM | POA: Diagnosis not present

## 2021-11-07 DIAGNOSIS — M25552 Pain in left hip: Secondary | ICD-10-CM | POA: Diagnosis not present

## 2021-11-08 ENCOUNTER — Other Ambulatory Visit: Payer: Self-pay

## 2021-11-08 ENCOUNTER — Encounter: Payer: Self-pay | Admitting: Physical Therapy

## 2021-11-08 ENCOUNTER — Ambulatory Visit: Payer: Medicare PPO | Admitting: Physical Therapy

## 2021-11-08 DIAGNOSIS — M25561 Pain in right knee: Secondary | ICD-10-CM

## 2021-11-08 DIAGNOSIS — M62830 Muscle spasm of back: Secondary | ICD-10-CM

## 2021-11-08 DIAGNOSIS — M545 Low back pain, unspecified: Secondary | ICD-10-CM | POA: Diagnosis not present

## 2021-11-08 DIAGNOSIS — R262 Difficulty in walking, not elsewhere classified: Secondary | ICD-10-CM

## 2021-11-08 NOTE — Therapy (Signed)
[facility] [address] ?[place], [place], [address] ?Phone: [phone]   Fax:  [phone] ? ?Physical Therapy Treatment ? ?Patient Details  ?Name: Jenna Mcdonald ?MRN: [id number] ?Date of Birth: Dec 16, 1945 ?Referring Provider (PT): Eustace Moore ? ? ?Encounter Date: 11/08/2021 ? ? PT End of Session - 11/08/21 1140   ? ? Visit Number 3   ? Date for PT Re-Evaluation 12/30/21   ? Authorization Type [employer]   ? PT Start Time 1010   ? PT Stop Time 1055   ? PT Time Calculation (min) 45 min   ? Activity Tolerance Patient limited by pain   ? Behavior During Therapy [facility] for tasks assessed/performed   ? ?  ?  ? ?  ? ? ?Past Medical History:  ?Diagnosis Date  ? Anemia   ? Aortic stenosis   ? mild by echo 09/2020 with mean AVG 53mHg  ? Breast CA ([facility]   ? left  ? Breast cancer ([facility]   ? Carotid stenosis   ? 1-39% left  ? Degenerative disc disease, lumbar   ? of the knees  ? Diabetes mellitus   ? Dysrhythmia   ? 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[facility]   ? Fatigue   ? Severe secondary to to gemfibrozil  ? Gastroesophageal reflux disease   ? denies  ? GI bleeding   ? a. 06/2018: EGD showed multiple nonbleeding duodenal ulcers. 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  ? Hammertoe   ? left second toe  ? Hypercholesteremia   ? Hypertension   ? Insomnia   ? Irritable bowel   ? Low back pain   ? Metabolic syndrome   ? Morbid obesity ([facility]   ? OSA on CPAP \  ? bipap  ? Osteoarthritis   ? Osteopenia   ? Persistent atrial fibrillation ([facility]   ? s/p afib ablation - Dr. ARayann Heman1/2020  ? Personal history of radiation therapy   ? Pneumonia   ? PONV (postoperative nausea and vomiting)   ? Sigmoid diverticulosis   ? Thalassemia minor   ? Vitamin D deficiency   ? ? ?Past Surgical History:  ?Procedure Laterality Date  ? ATRIAL FIBRILLATION ABLATION N/A 09/21/2018  ? Procedure: ATRIAL FIBRILLATION ABLATION;  Surgeon: AThompson Grayer MD;   [facility];  Service: Cardiovascular;  Laterality: N/A;  ? BIOPSY  07/06/2018  ? Procedure: BIOPSY;  Surgeon: MClarene Essex MD;  [facility]: [facility]  Service: Endoscopy;;  ? BREAST LUMPECTOMY Left 05/28/2009  ? BREAST SURGERY Left 10  ? lumpectomy  ? CARDIOVERSION N/A 07/07/2018  ? Procedure: CARDIOVERSION;  Surgeon: RSkeet Latch MD;  [place] [facility]  Service: Cardiovascular;  Laterality: N/A;  ? CARDIOVERSION N/A 07/14/2018  ? Procedure: CARDIOVERSION;  Surgeon: MLarey Dresser MD;  [facility];  Service: Cardiovascular;  Laterality: N/A;  ? CESAREAN SECTION    ? COLONOSCOPY WITH PROPOFOL N/A 07/06/2018  ? Procedure: COLONOSCOPY WITH PROPOFOL;  Surgeon: MClarene Essex MD;  [place]  Service: Endoscopy;  Laterality: N/A;  ? CYSTECTOMY  70  ? pilonidal   ? DILATION AND CURETTAGE OF UTERUS    ? ESOPHAGOGASTRODUODENOSCOPY (EGD) WITH PROPOFOL N/A 07/06/2018  ? Procedure: ESOPHAGOGASTRODUODENOSCOPY (EGD) WITH PROPOFOL;  Surgeon: MClarene Essex MD;  [place]  Service: Endoscopy;  Laterality: N/A;  ? EYE SURGERY Bilateral 12/14  ? cataracts  ? HOT HEMOSTASIS N/A 07/06/2018  ? Procedure: HOT HEMOSTASIS (ARGON  PLASMA COAGULATION/BICAP);  Surgeon: Clarene Essex, MD;  Location: [place];  Service: Endoscopy;  Laterality: N/A;  ? MAXIMUM ACCESS (MAS)POSTERIOR LUMBAR INTERBODY FUSION (PLIF) 2 LEVEL N/A 09/22/2013  ? Procedure: FOR MAXIMUM ACCESS  POSTERIOR LUMBAR INTERBODY FUSION LUMBAR THREE-FOUR FOUR-FIVE;  Surgeon: Eustace Moore, MD;  Location: [place] NEURO ORS;  Service: Neurosurgery;  Laterality: N/A;  ? OOPHORECTOMY    ? wedge section not rem  ? REPLACEMENT TOTAL KNEE Left 11  ? REPLACEMENT TOTAL KNEE    ? RIGHT/LEFT HEART CATH AND CORONARY ANGIOGRAPHY N/A 10/26/2020  ? Procedure: RIGHT/LEFT HEART CATH AND CORONARY ANGIOGRAPHY;  Surgeon: Leonie Man, MD;  Location: [place] CV LAB;  Service: Cardiovascular;  Laterality: N/A;  ? TEE WITHOUT  CARDIOVERSION N/A 07/07/2018  ? Procedure: TRANSESOPHAGEAL ECHOCARDIOGRAM (TEE);  Surgeon: [name], MD;  Location: [place];  Service: Cardiovascular;  Laterality: N/A;  ? TEE WITHOUT CARDIOVERSION N/A 09/21/2018  ? Procedure: TRANSESOPHAGEAL ECHOCARDIOGRAM (TEE);  Surgeon: Acie Fredrickson Wonda Cheng, MD;  Location: [place];  Service: Cardiovascular;  Laterality: N/A;  ? TONSILLECTOMY    ? TUBAL LIGATION    ? ? ?There were no vitals filed for this visit. ? ? Subjective Assessment - 11/08/21 1018   ? ? Subjective sharp pain in the hip is a little better, she saw the back surgeon and he felt MRI was unchanged and thought it could be the hips, saw ortopod and he felt it was her back, started her on prednisone   ? Currently in Pain? Yes   ? Pain Score 8    ? Pain Location Back   ? Pain Orientation Right   ? Pain Descriptors / Indicators Sharp   ? Pain Relieving Factors the movements seem to help some   ? ?  ?  ? ?  ? ? ? ? ? ? ? ? ? ? ? ? ? ? ? ? ? ? ? ? [place] Adult PT Treatment/Exercise - 11/08/21 0001   ? ?  ? Lumbar Exercises: Stretches  ? Passive Hamstring Stretch Right;Left;3 reps;20 seconds   ? Piriformis Stretch Right;Left;4 reps;20 seconds   ? Gastroc Stretch Right;Left;4 reps;20 seconds   ?  ? Lumbar Exercises: Aerobic  ? Nustep level 3 x 6 minutes   ?  ? Lumbar Exercises: Seated  ? Other Seated Lumbar Exercises green tband rows and extension 2x10   ? Other Seated Lumbar Exercises ball b/n knees , clams green tband, HS curl green tband, green tband ankle PF/DF 2x10 each   ?  ? Lumbar Exercises: Supine  ? Other Supine Lumbar Exercises butterfly stretch with help   ? Other Supine Lumbar Exercises feet on ball K2C, trunk rotation, isometric abs   ?  ? Manual Therapy  ? Manual Therapy Soft tissue mobilization   ? Soft tissue mobilization to the low back and buttock, focused on the right side   ? ?  ?  ? ?  ? ? ? ? ? ? ? ? ? ? ? ? PT Short Term Goals - 11/08/21 1142   ? ?  ? PT SHORT TERM GOAL #1  ? Title  independent with initial HEP   ? Status Partially Met   ? ?  ?  ? ?  ? ? ? ? PT Long Term Goals - 11/08/21 1142   ? ?  ? PT LONG TERM GOAL #1  ? Title report pain decreased 25%   ? Status On-going   ?  ? PT LONG  TERM GOAL #2  ? Title increase lumbar ROM 25%   ? Status On-going   ?  ? PT LONG TERM GOAL #3  ? Title report tolerance to standing at 10 minutes to cook a meal   ? Status On-going   ? ?  ?  ? ?  ? ? ? ? ? ? ? ? Plan - 11/08/21 1141   ? ? Clinical Impression Statement Patient reports still hurting, does report less cramps in the legs, I added a few more exercies and stretches, and added some STM to the right lumbar and buttock, most of the exercises hurt   ? PT Next Visit Plan slowly try to progress movements and function, she tends to have pain with most activities and have cramps in the legs   ? Consulted and Agree with Plan of Care Patient   ? ?  ?  ? ?  ? ? ?Patient will benefit from skilled therapeutic intervention in order to improve the following deficits and impairments:  Abnormal gait, Decreased range of motion, Difficulty walking, Decreased mobility, Decreased strength, Postural dysfunction, Improper body mechanics, Impaired flexibility, Decreased balance, Pain, Decreased activity tolerance, Increased muscle spasms ? ?Visit Diagnosis: ?Acute bilateral low back pain without sciatica ? ?Acute pain of right knee ? ?Difficulty in walking, not elsewhere classified ? ?Muscle spasm of back ? ? ? ? ?Problem List ?Patient Active Problem List  ? Diagnosis Date Noted  ? Shortness of breath   ? Unstable angina ([place]) 10/23/2020  ? Secondary hypercoagulable state ([place]) 10/15/2020  ? Encounter for therapeutic drug monitoring 08/17/2018  ? CKD (chronic kidney disease) stage 3, GFR 30-59 ml/min (HCC) 07/21/2018  ? Chronic anemia 07/21/2018  ? Anemia   ? Heme positive stool   ? Atrial fibrillation with RVR ([place]) 07/04/2018  ? Carotid stenosis   ? Bruit of left carotid artery 09/22/2017  ? Aortic stenosis   ? Heart  murmur 09/09/2016  ? Morbid obesity ([place]) 02/05/2016  ? Dry eye 05/17/2015  ? Type 2 diabetes mellitus without complication ([place]) 17/51/0258  ? Breast cancer, left breast ([place]) 07/22/2014  ? Obesity, Class III

## 2021-11-11 ENCOUNTER — Ambulatory Visit (INDEPENDENT_AMBULATORY_CARE_PROVIDER_SITE_OTHER): Payer: Medicare PPO | Admitting: *Deleted

## 2021-11-11 ENCOUNTER — Other Ambulatory Visit: Payer: Self-pay

## 2021-11-11 DIAGNOSIS — I4891 Unspecified atrial fibrillation: Secondary | ICD-10-CM

## 2021-11-11 DIAGNOSIS — Z5181 Encounter for therapeutic drug level monitoring: Secondary | ICD-10-CM

## 2021-11-11 LAB — POCT INR: INR: 2.8 (ref 2.0–3.0)

## 2021-11-11 NOTE — Patient Instructions (Addendum)
Description   ?- continue taking '5mg'$  daily except for the '4mg'$  on Mondays, Wednesdays and Fridays.  ?-Eat an extra serving of greens while on prednisone. ?-Recheck INR in 1 week.   ?[facility] Clinic [phone] Main [phone] ?  ?  ?

## 2021-11-13 ENCOUNTER — Encounter: Payer: Self-pay | Admitting: Physical Therapy

## 2021-11-13 ENCOUNTER — Ambulatory Visit: Payer: Medicare PPO | Admitting: Physical Therapy

## 2021-11-13 ENCOUNTER — Other Ambulatory Visit: Payer: Self-pay

## 2021-11-13 DIAGNOSIS — M25561 Pain in right knee: Secondary | ICD-10-CM | POA: Diagnosis not present

## 2021-11-13 DIAGNOSIS — M62830 Muscle spasm of back: Secondary | ICD-10-CM | POA: Diagnosis not present

## 2021-11-13 DIAGNOSIS — R262 Difficulty in walking, not elsewhere classified: Secondary | ICD-10-CM

## 2021-11-13 DIAGNOSIS — M545 Low back pain, unspecified: Secondary | ICD-10-CM

## 2021-11-13 NOTE — Therapy (Signed)
[place] ?[place] ?[address] ?[place], [place], [address] ?Phone: [phone]   Fax:  [phone] ? ?Physical Therapy Treatment ? ?Patient Details  ?Name: Jenna Mcdonald ?MRN: [id number] ?Date of Birth: 1945-10-23 ?Referring Provider (PT): Eustace Moore ? ? ?Encounter Date: 11/13/2021 ? ? PT End of Session - 11/13/21 0935   ? ? Visit Number 4   ? Number of Visits 13   ? Date for PT Re-Evaluation 12/30/21   ? Authorization Type [employer]   ? PT Start Time [phone]   ? PT Stop Time 0840   ? PT Time Calculation (min) 44 min   ? Activity Tolerance Patient limited by pain   ? Behavior During Therapy [facility] for tasks assessed/performed   ? ?  ?  ? ?  ? ? ?Past Medical History:  ?Diagnosis Date  ? Anemia   ? Aortic stenosis   ? mild by echo 09/2020 with mean AVG 74mHg  ? Breast CA ([facility]   ? left  ? Breast cancer ([place]   ? Carotid stenosis   ? 1-39% left  ? Degenerative disc disease, lumbar   ? of the knees  ? Diabetes mellitus   ? Dysrhythmia   ? 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[place]   ? Fatigue   ? Severe secondary to to gemfibrozil  ? Gastroesophageal reflux disease   ? denies  ? GI bleeding   ? a. 06/2018: EGD showed multiple nonbleeding duodenal ulcers. 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  ? Hammertoe   ? left second toe  ? Hypercholesteremia   ? Hypertension   ? Insomnia   ? Irritable bowel   ? Low back pain   ? Metabolic syndrome   ? Morbid obesity ([place]   ? OSA on CPAP \  ? bipap  ? Osteoarthritis   ? Osteopenia   ? Persistent atrial fibrillation ([facility]   ? s/p afib ablation - Dr. ARayann Heman1/2020  ? Personal history of radiation therapy   ? Pneumonia   ? PONV (postoperative nausea and vomiting)   ? Sigmoid diverticulosis   ? Thalassemia minor   ? Vitamin D deficiency   ? ? ?Past Surgical History:  ?Procedure Laterality Date  ? ATRIAL FIBRILLATION ABLATION N/A 09/21/2018  ? Procedure: ATRIAL FIBRILLATION ABLATION;   Surgeon: AThompson Grayer MD;  [facility];  Service: Cardiovascular;  Laterality: N/A;  ? BIOPSY  07/06/2018  ? Procedure: BIOPSY;  Surgeon: MClarene Essex MD;  Location: [facility]  Service: Endoscopy;;  ? BREAST LUMPECTOMY Left 05/28/2009  ? BREAST SURGERY Left 10  ? lumpectomy  ? CARDIOVERSION N/A 07/07/2018  ? Procedure: CARDIOVERSION;  Surgeon: RSkeet Latch MD;  [facility]: [facility]  Service: Cardiovascular;  Laterality: N/A;  ? CARDIOVERSION N/A 07/14/2018  ? Procedure: CARDIOVERSION;  Surgeon: MLarey Dresser MD;  [facility];  Service: Cardiovascular;  Laterality: N/A;  ? CESAREAN SECTION    ? COLONOSCOPY WITH PROPOFOL N/A 07/06/2018  ? Procedure: COLONOSCOPY WITH PROPOFOL;  Surgeon: MClarene Essex MD;  [place] [address]  Service: Endoscopy;  Laterality: N/A;  ? CYSTECTOMY  70  ? pilonidal   ? DILATION AND CURETTAGE OF UTERUS    ? ESOPHAGOGASTRODUODENOSCOPY (EGD) WITH PROPOFOL N/A 07/06/2018  ? Procedure: ESOPHAGOGASTRODUODENOSCOPY (EGD) WITH PROPOFOL;  Surgeon: MClarene Essex MD;  [facility]  Service: Endoscopy;  Laterality: N/A;  ? EYE SURGERY Bilateral 12/14  ? cataracts  ? HOT HEMOSTASIS N/A  07/06/2018  ? Procedure: HOT HEMOSTASIS (ARGON PLASMA COAGULATION/BICAP);  Surgeon: Clarene Essex, MD;  Location: [place];  Service: Endoscopy;  Laterality: N/A;  ? MAXIMUM ACCESS (MAS)POSTERIOR LUMBAR INTERBODY FUSION (PLIF) 2 LEVEL N/A 09/22/2013  ? Procedure: FOR MAXIMUM ACCESS  POSTERIOR LUMBAR INTERBODY FUSION LUMBAR THREE-FOUR FOUR-FIVE;  Surgeon: Eustace Moore, MD;  Location: [place] [facility];  Service: Neurosurgery;  Laterality: N/A;  ? OOPHORECTOMY    ? wedge section not rem  ? REPLACEMENT TOTAL KNEE Left 11  ? REPLACEMENT TOTAL KNEE    ? RIGHT/LEFT HEART CATH AND CORONARY ANGIOGRAPHY N/A 10/26/2020  ? Procedure: RIGHT/LEFT HEART CATH AND CORONARY ANGIOGRAPHY;  Surgeon: Leonie Man, MD;  Location: [place] [facility];  Service: Cardiovascular;   Laterality: N/A;  ? TEE WITHOUT CARDIOVERSION N/A 07/07/2018  ? Procedure: TRANSESOPHAGEAL ECHOCARDIOGRAM (TEE);  Surgeon: [name], MD;  Location: [place];  Service: Cardiovascular;  Laterality: N/A;  ? TEE WITHOUT CARDIOVERSION N/A 09/21/2018  ? Procedure: TRANSESOPHAGEAL ECHOCARDIOGRAM (TEE);  Surgeon: Acie Fredrickson Wonda Cheng, MD;  Location: [place];  Service: Cardiovascular;  Laterality: N/A;  ? TONSILLECTOMY    ? TUBAL LIGATION    ? ? ?There were no vitals filed for this visit. ? ? Subjective Assessment - 11/13/21 0800   ? ? Subjective I am feeling better, I think the prednisone is helping   ? Currently in Pain? Yes   ? Pain Score 4    ? Pain Location Back   ? Pain Descriptors / Indicators Sore   ? Pain Relieving Factors prednisone helps, the stretches seem to help   ? ?  ?  ? ?  ? ? ? ? ? ? ? ? ? ? ? ? ? ? ? ? ? ? ? ? [place] Adult PT Treatment/Exercise - 11/13/21 0001   ? ?  ? Ambulation/Gait  ? Gait Comments worked on stairs step over step up and down 4" and 6"   ?  ? Lumbar Exercises: Stretches  ? Passive Hamstring Stretch Right;Left;3 reps;20 seconds   ? Piriformis Stretch Right;Left;4 reps;20 seconds   ? Gastroc Stretch Right;Left;4 reps;20 seconds   ?  ? Lumbar Exercises: Aerobic  ? Nustep level 3 x 6 minutes   ?  ? Lumbar Exercises: Machines for Strengthening  ? Cybex Knee Flexion 15# 3x10   ? Other Lumbar Machine Exercise 5# row   ?  ? Lumbar Exercises: Seated  ? Other Seated Lumbar Exercises green tband rows and extension 2x10   ? Other Seated Lumbar Exercises ball b/n knees , clams green tband,  green tband ankle PF/DF 2x10 each   ?  ? Lumbar Exercises: Supine  ? Other Supine Lumbar Exercises feet on ball K2C, trunk rotation, isometric abs   ? ?  ?  ? ?  ? ? ? ? ? ? ? ? ? ? ? ? PT Short Term Goals - 11/13/21 0937   ? ?  ? PT SHORT TERM GOAL #1  ? Title independent with initial HEP   ? Status Achieved   ? ?  ?  ? ?  ? ? ? ? PT Long Term Goals - 11/13/21 0937   ? ?  ? PT LONG TERM GOAL #1  ?  Title report pain decreased 25%   ? Status On-going   ?  ? PT LONG TERM GOAL #2  ? Title increase lumbar ROM 25%   ? Status On-going   ?  ? PT LONG TERM GOAL #3  ?  Title report tolerance to standing at 10 minutes to cook a meal   ? Status On-going   ? ?  ?  ? ?  ? ? ? ? ? ? ? ? Plan - 11/13/21 0935   ? ? Clinical Impression Statement Patient reports that she is feeling better, thinks that it is the prednisone that is helping.  She is moving better, she does have pain in the knees and the hip and backs with the exercises, she is more willing to try the exercises today.  She is very tight in the LE mms and does not tolerate much of the stretches but is willing to try, I did add a few more exercises today   ? PT Next Visit Plan slowly try to progress movements and function, she tends to have pain with most activities and have cramps in the legs   ? Consulted and Agree with Plan of Care Patient   ? ?  ?  ? ?  ? ? ?Patient will benefit from skilled therapeutic intervention in order to improve the following deficits and impairments:  Abnormal gait, Decreased range of motion, Difficulty walking, Decreased mobility, Decreased strength, Postural dysfunction, Improper body mechanics, Impaired flexibility, Decreased balance, Pain, Decreased activity tolerance, Increased muscle spasms ? ?Visit Diagnosis: ?Acute bilateral low back pain without sciatica ? ?Acute pain of right knee ? ?Difficulty in walking, not elsewhere classified ? ?Muscle spasm of back ? ? ? ? ?Problem List ?Patient Active Problem List  ? Diagnosis Date Noted  ? Shortness of breath   ? Unstable angina ([place]) 10/23/2020  ? Secondary hypercoagulable state ([place]) 10/15/2020  ? Encounter for therapeutic drug monitoring 08/17/2018  ? CKD (chronic kidney disease) stage 3, GFR 30-59 ml/min (HCC) 07/21/2018  ? Chronic anemia 07/21/2018  ? Anemia   ? Heme positive stool   ? Atrial fibrillation with RVR ([place]) 07/04/2018  ? Carotid stenosis   ? Bruit of left carotid artery  09/22/2017  ? Aortic stenosis   ? Heart murmur 09/09/2016  ? Morbid obesity ([place]) 02/05/2016  ? Dry eye 05/17/2015  ? Type 2 diabetes mellitus without complication ([place]) 99/37/1696  ? Breast cancer, left breast ([place]

## 2021-11-15 ENCOUNTER — Other Ambulatory Visit: Payer: Self-pay

## 2021-11-15 ENCOUNTER — Ambulatory Visit: Payer: Medicare PPO | Admitting: Physical Therapy

## 2021-11-15 DIAGNOSIS — M25561 Pain in right knee: Secondary | ICD-10-CM | POA: Diagnosis not present

## 2021-11-15 DIAGNOSIS — M62830 Muscle spasm of back: Secondary | ICD-10-CM | POA: Diagnosis not present

## 2021-11-15 DIAGNOSIS — M545 Low back pain, unspecified: Secondary | ICD-10-CM

## 2021-11-15 DIAGNOSIS — R262 Difficulty in walking, not elsewhere classified: Secondary | ICD-10-CM

## 2021-11-15 NOTE — Therapy (Signed)
[place] ?[place] ?[address] ?[place], [place], [address] ?Phone: [phone]   Fax:  [phone] ? ?Physical Therapy Treatment ? ?Patient Details  ?Name: Jenna Mcdonald ?MRN: [id number] ?Date of Birth: [birth year] ?Referring Provider (PT): Eustace Moore ? ? ?Encounter Date: 11/15/2021 ? ? PT End of Session - 11/15/21 5625   ? ? Visit Number 5   ? Number of Visits 13   ? Date for PT Re-Evaluation 12/30/21   ? Authorization Type Humana   ? PT Start Time 0840   ? PT Stop Time 0925   ? PT Time Calculation (min) 45 min   ? ?  ?  ? ?  ? ? ?Past Medical History:  ?Diagnosis Date  ? Anemia   ? Aortic stenosis   ? mild by echo 09/2020 with mean AVG 1mHg  ? Breast CA ([facility]   ? left  ? Breast cancer ([facility] [facility]   ? Carotid stenosis   ? 1-39% left  ? Degenerative disc disease, lumbar   ? of the knees  ? Diabetes mellitus   ? Dysrhythmia   ? 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[place]   ? Fatigue   ? Severe secondary to to gemfibrozil  ? Gastroesophageal reflux disease   ? denies  ? GI bleeding   ? a. 06/2018: EGD showed multiple nonbleeding duodenal ulcers. 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  ? Hammertoe   ? left second toe  ? Hypercholesteremia   ? Hypertension   ? Insomnia   ? Irritable bowel   ? Low back pain   ? Metabolic syndrome   ? Morbid obesity (HP[place]   ? OSA on CPAP \  ? bipap  ? Osteoarthritis   ? Osteopenia   ? Persistent atrial fibrillation ([facility]   ? s/p afib ablation - Dr. ARayann Heman1/2020  ? Personal history of radiation therapy   ? Pneumonia   ? PONV (postoperative nausea and vomiting)   ? Sigmoid diverticulosis   ? Thalassemia minor   ? Vitamin D deficiency   ? ? ?Past Surgical History:  ?Procedure Laterality Date  ? ATRIAL FIBRILLATION ABLATION N/A 09/21/2018  ? Procedure: ATRIAL FIBRILLATION ABLATION;  Surgeon: AThompson Grayer MD;  [facility];  Service: Cardiovascular;  Laterality: N/A;  ?  BIOPSY  07/06/2018  ? Procedure: BIOPSY;  Surgeon: MClarene Essex MD;  [place] [facility]  Service: Endoscopy;;  ? BREAST LUMPECTOMY Left 05/28/2009  ? BREAST SURGERY Left 10  ? lumpectomy  ? CARDIOVERSION N/A 07/07/2018  ? Procedure: CARDIOVERSION;  Surgeon: RSkeet Latch MD;  [place] [facility]  Service: Cardiovascular;  Laterality: N/A;  ? CARDIOVERSION N/A 07/14/2018  ? Procedure: CARDIOVERSION;  Surgeon: MLarey Dresser MD;  [facility];  Service: Cardiovascular;  Laterality: N/A;  ? CESAREAN SECTION    ? COLONOSCOPY WITH PROPOFOL N/A 07/06/2018  ? Procedure: COLONOSCOPY WITH PROPOFOL;  Surgeon: MClarene Essex MD;  Location: [facility]  Service: Endoscopy;  Laterality: N/A;  ? CYSTECTOMY  70  ? pilonidal   ? DILATION AND CURETTAGE OF UTERUS    ? ESOPHAGOGASTRODUODENOSCOPY (EGD) WITH PROPOFOL N/A 07/06/2018  ? Procedure: ESOPHAGOGASTRODUODENOSCOPY (EGD) WITH PROPOFOL;  Surgeon: MClarene Essex MD;  [place] [facility]  Service: Endoscopy;  Laterality: N/A;  ? EYE SURGERY Bilateral 12/14  ? cataracts  ? HOT HEMOSTASIS N/A 07/06/2018  ? Procedure: HOT HEMOSTASIS (ARGON PLASMA COAGULATION/BICAP);  Surgeon: MClarene Essex MD;  [place]  Service: Endoscopy;  Laterality: N/A;  ? MAXIMUM ACCESS (MAS)POSTERIOR LUMBAR INTERBODY FUSION (PLIF) 2 LEVEL N/A 09/22/2013  ? Procedure: FOR MAXIMUM ACCESS  POSTERIOR LUMBAR INTERBODY FUSION LUMBAR THREE-FOUR FOUR-FIVE;  Surgeon: Eustace Moore, MD;  Location: [place] [facility];  Service: Neurosurgery;  Laterality: N/A;  ? OOPHORECTOMY    ? wedge section not rem  ? REPLACEMENT TOTAL KNEE Left 11  ? REPLACEMENT TOTAL KNEE    ? RIGHT/LEFT HEART CATH AND CORONARY ANGIOGRAPHY N/A 10/26/2020  ? Procedure: RIGHT/LEFT HEART CATH AND CORONARY ANGIOGRAPHY;  Surgeon: Leonie Man, MD;  Location: [place] [facility];  Service: Cardiovascular;  Laterality: N/A;  ? TEE WITHOUT CARDIOVERSION N/A 07/07/2018  ? Procedure: TRANSESOPHAGEAL ECHOCARDIOGRAM (TEE);   Surgeon: [name], MD;  Location: [place];  Service: Cardiovascular;  Laterality: N/A;  ? TEE WITHOUT CARDIOVERSION N/A 09/21/2018  ? Procedure: TRANSESOPHAGEAL ECHOCARDIOGRAM (TEE);  Surgeon: Acie Fredrickson Wonda Cheng, MD;  Location: [place];  Service: Cardiovascular;  Laterality: N/A;  ? TONSILLECTOMY    ? TUBAL LIGATION    ? ? ?There were no vitals filed for this visit. ? ? Subjective Assessment - 11/15/21 0838   ? ? Subjective sore after last session but okay. MD follow up in next 2 weeks. best I have felt in 1 year with prednisone   ? Currently in Pain? Yes   ? Pain Score 4    ? ?  ?  ? ?  ? ? ? ? ? ? ? ? ? ? ? ? ? ? ? ? ? ? ? ? [place] Adult PT Treatment/Exercise - 11/15/21 0001   ? ?  ? Self-Care  ? Self-Care Lifting;ADL's   vacuuming  ?  ? Lumbar Exercises: Aerobic  ? Nustep level 3 x 6 minutes   ?  ? Lumbar Exercises: Seated  ? Long [employer] on Chair Strengthening;Both;2 sets;10 reps;Weights   ? LAQ on Chair Weights (lbs) 2   ? Other Seated Lumbar Exercises green tband rows and extension 2x10   ? Other Seated Lumbar Exercises ball b/n knees , clams green tband, green tband hip flexion, green tband ankle PF/DF 2x10 each   ?  ? Lumbar Exercises: Supine  ? Ab Set 15 reps;3 seconds   ? Bridge Non-compliant;15 reps   plus KTC and obl  ?  ? Manual Therapy  ? Manual Therapy Passive ROM   ? Passive ROM LE and trunk   ? ?  ?  ? ?  ? ? ? ? ? ? ? ? ? ? ? ? PT Short Term Goals - 11/13/21 0937   ? ?  ? PT SHORT TERM GOAL #1  ? Title independent with initial HEP   ? Status Achieved   ? ?  ?  ? ?  ? ? ? ? PT Long Term Goals - 11/15/21 0921   ? ?  ? PT LONG TERM GOAL #1  ? Title report pain decreased 25%   ? Status Partially Met   ?  ? PT LONG TERM GOAL #2  ? Title increase lumbar ROM 25%   ? Status Partially Met   ?  ? PT LONG TERM GOAL #3  ? Title report tolerance to standing at 10 minutes to cook a meal   ? Baseline 5 min   ? Status Partially Met   ?  ? PT LONG TERM GOAL #4  ? Title report that she can walk 10  minutes   ? Baseline currently reports that  she cannot walk more than 5 minutes   ? Status Partially Met   ? ?  ?  ? ?  ? ? ? ? ? ? ? ? Plan - 11/15/21 0923   ? ? Clinical Impression Statement pt very pleased with progress and feels PT is really helping- worried what is going to happen when she stops prednisone next week. progressed ex and did well, pain with bridge. end range LE an dtrunk strteches taht she tolerated well. educ on posture and BM for lifting and ADLs- esp vaccumming. progressing with goals   ? PT Treatment/Interventions ADLs/Self Care Home Management;Cryotherapy;Electrical Stimulation;Moist Heat;Gait training;Stair training;Functional mobility training;Iontophoresis 25m/ml Dexamethasone;Therapeutic activities;Therapeutic exercise;Balance training;Neuromuscular re-education;Manual techniques;Patient/family education;Dry needling   ? PT Next Visit Plan slowly progress   ? ?  ?  ? ?  ? ? ?Patient will benefit from skilled therapeutic intervention in order to improve the following deficits and impairments:  Abnormal gait, Decreased range of motion, Difficulty walking, Decreased mobility, Decreased strength, Postural dysfunction, Improper body mechanics, Impaired flexibility, Decreased balance, Pain, Decreased activity tolerance, Increased muscle spasms ? ?Visit Diagnosis: ?Acute bilateral low back pain without sciatica ? ?Difficulty in walking, not elsewhere classified ? ? ? ? ?Problem List ?Patient Active Problem List  ? Diagnosis Date Noted  ? Shortness of breath   ? Unstable angina ([place] 10/23/2020  ? Secondary hypercoagulable state ([facility] 10/15/2020  ? Encounter for therapeutic drug monitoring 08/17/2018  ? CKD (chronic kidney disease) stage 3, GFR 30-59 ml/min (HCC) 07/21/2018  ? Chronic anemia 07/21/2018  ? Anemia   ? Heme positive stool   ? Atrial fibrillation with RVR ([place] 07/04/2018  ? Carotid stenosis   ? Bruit of left carotid artery 09/22/2017  ? Aortic stenosis   ? Heart murmur 09/09/2016  ?  Morbid obesity ([place] 02/05/2016  ? Dry eye 05/17/2015  ? Type 2 diabetes mellitus without complication (HS[place] 104/75/3391 ? Breast cancer, left breast ([facility] 07/22/2014  ? Obesity, Class III, BMI 40-49.9 (morbid

## 2021-11-18 ENCOUNTER — Ambulatory Visit (INDEPENDENT_AMBULATORY_CARE_PROVIDER_SITE_OTHER): Payer: Medicare PPO

## 2021-11-18 ENCOUNTER — Other Ambulatory Visit: Payer: Self-pay

## 2021-11-18 DIAGNOSIS — I4891 Unspecified atrial fibrillation: Secondary | ICD-10-CM | POA: Diagnosis not present

## 2021-11-18 DIAGNOSIS — Z5181 Encounter for therapeutic drug level monitoring: Secondary | ICD-10-CM

## 2021-11-18 LAB — POCT INR: INR: 3.1 — AB (ref 2.0–3.0)

## 2021-11-18 NOTE — Patient Instructions (Signed)
Description   ?Take '2mg'$  today, then resume same dosage of Warfarin '5mg'$  daily except for the '4mg'$  on Mondays, Wednesdays and Fridays. Recheck INR in 3 weeks.  [facility] [phone] Main [phone] Call with any new medications. ?If pt wanted to switch to [place] per pharm D- $80/18month $40/154month  ?  ?

## 2021-11-19 ENCOUNTER — Encounter: Payer: Self-pay | Admitting: Physical Therapy

## 2021-11-19 ENCOUNTER — Ambulatory Visit: Payer: Medicare PPO | Admitting: Physical Therapy

## 2021-11-19 DIAGNOSIS — M545 Low back pain, unspecified: Secondary | ICD-10-CM

## 2021-11-19 DIAGNOSIS — M62830 Muscle spasm of back: Secondary | ICD-10-CM | POA: Diagnosis not present

## 2021-11-19 DIAGNOSIS — R262 Difficulty in walking, not elsewhere classified: Secondary | ICD-10-CM

## 2021-11-19 DIAGNOSIS — M25561 Pain in right knee: Secondary | ICD-10-CM | POA: Diagnosis not present

## 2021-11-19 NOTE — Therapy (Signed)
[place] [address] ?[place], [place], [address] ?Phone: [phone]   Fax:  [phone] ? ?Physical Therapy Treatment ? ?Patient Details  ?Name: Jenna Mcdonald ?MRN: [id number] ?Date of Birth: April 06, 1946 ?Referring Provider (PT): Eustace Moore ? ? ?Encounter Date: 11/19/2021 ? ? PT End of Session - 11/19/21 1144   ? ? Visit Number 6   ? Date for PT Re-Evaluation 12/30/21   ? Authorization Type [employer]   ? PT Start Time 1008   ? PT Stop Time 1055   ? PT Time Calculation (min) 47 min   ? Activity Tolerance Patient tolerated treatment well   ? Behavior During Therapy [facility] for tasks assessed/performed   ? ?  ?  ? ?  ? ? ?Past Medical History:  ?Diagnosis Date  ? Anemia   ? Aortic stenosis   ? mild by echo 09/2020 with mean AVG 43mHg  ? Breast CA ([place]   ? left  ? Breast cancer ([place]   ? Carotid stenosis   ? 1-39% left  ? Degenerative disc disease, lumbar   ? of the knees  ? Diabetes mellitus   ? Dysrhythmia   ? 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[place]   ? Fatigue   ? Severe secondary to to gemfibrozil  ? Gastroesophageal reflux disease   ? denies  ? GI bleeding   ? a. 06/2018: EGD showed multiple nonbleeding duodenal ulcers. 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  ? Hammertoe   ? left second toe  ? Hypercholesteremia   ? Hypertension   ? Insomnia   ? Irritable bowel   ? Low back pain   ? Metabolic syndrome   ? Morbid obesity ([place]   ? OSA on CPAP \  ? bipap  ? Osteoarthritis   ? Osteopenia   ? Persistent atrial fibrillation ([facility]   ? s/p afib ablation - Dr. ARayann Heman1/2020  ? Personal history of radiation therapy   ? Pneumonia   ? PONV (postoperative nausea and vomiting)   ? Sigmoid diverticulosis   ? Thalassemia minor   ? Vitamin D deficiency   ? ? ?Past Surgical History:  ?Procedure Laterality Date  ? ATRIAL FIBRILLATION ABLATION N/A 09/21/2018  ? Procedure: ATRIAL FIBRILLATION ABLATION;  Surgeon: AThompson Grayer MD;  [place];  Service: Cardiovascular;  Laterality: N/A;  ? BIOPSY  07/06/2018  ? Procedure: BIOPSY;  Surgeon: MClarene Essex MD;  Location: [place]  Service: Endoscopy;;  ? BREAST LUMPECTOMY Left 05/28/2009  ? BREAST SURGERY Left 10  ? lumpectomy  ? CARDIOVERSION N/A 07/07/2018  ? Procedure: CARDIOVERSION;  Surgeon: RSkeet Latch MD;  [place]: [place]  Service: Cardiovascular;  Laterality: N/A;  ? CARDIOVERSION N/A 07/14/2018  ? Procedure: CARDIOVERSION;  Surgeon: MLarey Dresser MD;  [facility];  Service: Cardiovascular;  Laterality: N/A;  ? CESAREAN SECTION    ? COLONOSCOPY WITH PROPOFOL N/A 07/06/2018  ? Procedure: COLONOSCOPY WITH PROPOFOL;  Surgeon: MClarene Essex MD;  [place]  Service: Endoscopy;  Laterality: N/A;  ? CYSTECTOMY  70  ? pilonidal   ? DILATION AND CURETTAGE OF UTERUS    ? ESOPHAGOGASTRODUODENOSCOPY (EGD) WITH PROPOFOL N/A 07/06/2018  ? Procedure: ESOPHAGOGASTRODUODENOSCOPY (EGD) WITH PROPOFOL;  Surgeon: MClarene Essex MD;  Location: [place]  Service: Endoscopy;  Laterality: N/A;  ? EYE SURGERY Bilateral 12/14  ? cataracts  ? HOT HEMOSTASIS N/A 07/06/2018  ? Procedure: HOT HEMOSTASIS (ARGON  PLASMA COAGULATION/BICAP);  Surgeon: Clarene Essex, MD;  Location: [place];  Service: Endoscopy;  Laterality: N/A;  ? MAXIMUM ACCESS (MAS)POSTERIOR LUMBAR INTERBODY FUSION (PLIF) 2 LEVEL N/A 09/22/2013  ? Procedure: FOR MAXIMUM ACCESS  POSTERIOR LUMBAR INTERBODY FUSION LUMBAR THREE-FOUR FOUR-FIVE;  Surgeon: Eustace Moore, MD;  Location: [place] [facility];  Service: Neurosurgery;  Laterality: N/A;  ? OOPHORECTOMY    ? wedge section not rem  ? REPLACEMENT TOTAL KNEE Left 11  ? REPLACEMENT TOTAL KNEE    ? RIGHT/LEFT HEART CATH AND CORONARY ANGIOGRAPHY N/A 10/26/2020  ? Procedure: RIGHT/LEFT HEART CATH AND CORONARY ANGIOGRAPHY;  Surgeon: Leonie Man, MD;  Location: [place] CV LAB;  Service: Cardiovascular;  Laterality: N/A;  ? TEE  WITHOUT CARDIOVERSION N/A 07/07/2018  ? Procedure: TRANSESOPHAGEAL ECHOCARDIOGRAM (TEE);  Surgeon: [name], MD;  Location: [place];  Service: Cardiovascular;  Laterality: N/A;  ? TEE WITHOUT CARDIOVERSION N/A 09/21/2018  ? Procedure: TRANSESOPHAGEAL ECHOCARDIOGRAM (TEE);  Surgeon: Acie Fredrickson Wonda Cheng, MD;  Location: [place];  Service: Cardiovascular;  Laterality: N/A;  ? TONSILLECTOMY    ? TUBAL LIGATION    ? ? ?There were no vitals filed for this visit. ? ? Subjective Assessment - 11/19/21 1012   ? ? Subjective Doing okay but I am on my last prednisone today and have tapered down I think I am feeling more pain again   ? Currently in Pain? Yes   ? Pain Score 5    ? Pain Location Back   ? Aggravating Factors  tapering from the prednisone   ? ?  ?  ? ?  ? ? ? ? ? ? ? ? ? ? ? ? ? ? ? ? ? ? ? ? [place] Adult PT Treatment/Exercise - 11/19/21 0001   ? ?  ? Lumbar Exercises: Aerobic  ? Nustep level 4 x 6 minutes   ?  ? Lumbar Exercises: Machines for Strengthening  ? Cybex Knee Flexion 15# 3x10   ? Other Lumbar Machine Exercise 5# straight arm pulls cues for posture and core activation   ?  ? Lumbar Exercises: Standing  ? Row Strengthening;20 reps   5#  ?  ? Lumbar Exercises: Seated  ? Sit to Stand 10 reps   2# hand weights  ? Sit to Stand Limitations iso abdominals with ball 15 x 3 sec   ? Other Seated Lumbar Exercises green tband hip abd and flex 15 each   ?  ? Lumbar Exercises: Supine  ? Other Supine Lumbar Exercises feet on ball K2C, trunk rotation, isometric abs   ?  ? Manual Therapy  ? Soft tissue mobilization to the low back and buttock, focused on the right side   ? Passive ROM LE and trunk   ? ?  ?  ? ?  ? ? ? ? ? ? ? ? ? ? ? ? PT Short Term Goals - 11/13/21 0937   ? ?  ? PT SHORT TERM GOAL #1  ? Title independent with initial HEP   ? Status Achieved   ? ?  ?  ? ?  ? ? ? ? PT Long Term Goals - 11/19/21 1149   ? ?  ? PT LONG TERM GOAL #1  ? Title report pain decreased 25%   ? Status Partially Met   ?  ?  PT LONG TERM GOAL #2  ? Title increase lumbar ROM 25%   ? Status Partially Met   ? ?  ?  ? ?  ? ? ? ? ? ? ? ?  Plan - 11/19/21 1145   ? ? Clinical Impression Statement Patient is doing well she is worried that she is going backward because today is her last prednisone, She continued to be able to do all the exercises but with some increased pain.  She is very tight in the calves, HS and piriformis mms   ? PT Next Visit Plan see how she does once the prednisone is over   ? Consulted and Agree with Plan of Care Patient   ? ?  ?  ? ?  ? ? ?Patient will benefit from skilled therapeutic intervention in order to improve the following deficits and impairments:  Abnormal gait, Decreased range of motion, Difficulty walking, Decreased mobility, Decreased strength, Postural dysfunction, Improper body mechanics, Impaired flexibility, Decreased balance, Pain, Decreased activity tolerance, Increased muscle spasms ? ?Visit Diagnosis: ?Acute bilateral low back pain without sciatica ? ?Difficulty in walking, not elsewhere classified ? ? ? ? ?Problem List ?Patient Active Problem List  ? Diagnosis Date Noted  ? Shortness of breath   ? Unstable angina ([place]) 10/23/2020  ? Secondary hypercoagulable state ([place]) 10/15/2020  ? Encounter for therapeutic drug monitoring 08/17/2018  ? CKD (chronic kidney disease) stage 3, GFR 30-59 ml/min (HCC) 07/21/2018  ? Chronic anemia 07/21/2018  ? Anemia   ? Heme positive stool   ? Atrial fibrillation with RVR ([place]) 07/04/2018  ? Carotid stenosis   ? Bruit of left carotid artery 09/22/2017  ? Aortic stenosis   ? Heart murmur 09/09/2016  ? Morbid obesity ([place]) 02/05/2016  ? Dry eye 05/17/2015  ? Type 2 diabetes mellitus without complication ([place]) 65/42/7156  ? Breast cancer, left breast ([place]) 07/22/2014  ? Obesity, Class III, BMI 40-49.9 (morbid obesity) ([place]) 07/06/2014  ? Macular pseudohole 05/17/2014  ? History of surgical procedure 05/17/2014  ? OSA (obstructive sleep apnea) 11/17/2013  ? S/P lumbar  spinal fusion 09/22/2013  ? Persistent atrial fibrillation ([place]) 09/11/2011  ?  Class: Acute  ? 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[place]) 09/11/2011  ?  Class: Chronic  ? Essential hypertension 09/11/2011  ? Exogenous ob

## 2021-11-21 ENCOUNTER — Encounter: Payer: Self-pay | Admitting: Physical Therapy

## 2021-11-21 ENCOUNTER — Ambulatory Visit: Payer: Medicare PPO | Admitting: Physical Therapy

## 2021-11-21 DIAGNOSIS — R262 Difficulty in walking, not elsewhere classified: Secondary | ICD-10-CM

## 2021-11-21 DIAGNOSIS — M25561 Pain in right knee: Secondary | ICD-10-CM

## 2021-11-21 DIAGNOSIS — M545 Low back pain, unspecified: Secondary | ICD-10-CM

## 2021-11-21 DIAGNOSIS — M62830 Muscle spasm of back: Secondary | ICD-10-CM

## 2021-11-21 NOTE — Therapy (Signed)
[place] ?[place] [address] ?[place], [place], [address] ?Phone: [phone]   Fax:  [phone] ? ?Physical Therapy Treatment ? ?Patient Details  ?Name: Jenna Mcdonald ?MRN: [id number] ?Date of Birth: 02/06/1946 ?Referring Provider (PT): Eustace Moore ? ? ?Encounter Date: 11/21/2021 ? ? PT End of Session - 11/21/21 1001   ? ? Visit Number 7   ? Date for PT Re-Evaluation 12/17/21   ? Authorization Type Humana   ? PT Start Time [phone]   ? PT Stop Time 1015   ? PT Time Calculation (min) 50 min   ? Activity Tolerance Patient tolerated treatment well   ? Behavior During Therapy [facility] for tasks assessed/performed   ? ?  ?  ? ?  ? ? ?Past Medical History:  ?Diagnosis Date  ? Anemia   ? Aortic stenosis   ? mild by echo 09/2020 with mean AVG 60mHg  ? Breast CA ([place]   ? left  ? Breast cancer ([place]   ? Carotid stenosis   ? 1-39% left  ? Degenerative disc disease, lumbar   ? of the knees  ? Diabetes mellitus   ? Dysrhythmia   ? 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[place]   ? Fatigue   ? Severe secondary to to gemfibrozil  ? Gastroesophageal reflux disease   ? denies  ? GI bleeding   ? a. 06/2018: EGD showed multiple nonbleeding duodenal ulcers. 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  ? Hammertoe   ? left second toe  ? Hypercholesteremia   ? Hypertension   ? Insomnia   ? Irritable bowel   ? Low back pain   ? Metabolic syndrome   ? Morbid obesity ([place]   ? OSA on CPAP \  ? bipap  ? Osteoarthritis   ? Osteopenia   ? Persistent atrial fibrillation ([facility]   ? s/p afib ablation - Dr. ARayann Heman1/2020  ? Personal history of radiation therapy   ? Pneumonia   ? PONV (postoperative nausea and vomiting)   ? Sigmoid diverticulosis   ? Thalassemia minor   ? Vitamin D deficiency   ? ? ?Past Surgical History:  ?Procedure Laterality Date  ? ATRIAL FIBRILLATION ABLATION N/A 09/21/2018  ? Procedure: ATRIAL FIBRILLATION ABLATION;  Surgeon: AThompson Grayer MD;  [facility];  Service: Cardiovascular;  Laterality: N/A;  ? BIOPSY  07/06/2018  ? Procedure: BIOPSY;  Surgeon: MClarene Essex MD;  Location: [place]  Service: Endoscopy;;  ? BREAST LUMPECTOMY Left 05/28/2009  ? BREAST SURGERY Left 10  ? lumpectomy  ? CARDIOVERSION N/A 07/07/2018  ? Procedure: CARDIOVERSION;  Surgeon: RSkeet Latch MD;  Location: [place]  Service: Cardiovascular;  Laterality: N/A;  ? CARDIOVERSION N/A 07/14/2018  ? Procedure: CARDIOVERSION;  Surgeon: MLarey Dresser MD;  [facility];  Service: Cardiovascular;  Laterality: N/A;  ? CESAREAN SECTION    ? COLONOSCOPY WITH PROPOFOL N/A 07/06/2018  ? Procedure: COLONOSCOPY WITH PROPOFOL;  Surgeon: MClarene Essex MD;  [place]  Service: Endoscopy;  Laterality: N/A;  ? CYSTECTOMY  70  ? pilonidal   ? DILATION AND CURETTAGE OF UTERUS    ? ESOPHAGOGASTRODUODENOSCOPY (EGD) WITH PROPOFOL N/A 07/06/2018  ? Procedure: ESOPHAGOGASTRODUODENOSCOPY (EGD) WITH PROPOFOL;  Surgeon: MClarene Essex MD;  Location: [place]  Service: Endoscopy;  Laterality: N/A;  ? EYE SURGERY Bilateral 12/14  ? cataracts  ? HOT HEMOSTASIS N/A 07/06/2018  ? Procedure: HOT HEMOSTASIS (ARGON  PLASMA COAGULATION/BICAP);  Surgeon: Clarene Essex, MD;  Location: [place];  Service: Endoscopy;  Laterality: N/A;  ? MAXIMUM ACCESS (MAS)POSTERIOR LUMBAR INTERBODY FUSION (PLIF) 2 LEVEL N/A 09/22/2013  ? Procedure: FOR MAXIMUM ACCESS  POSTERIOR LUMBAR INTERBODY FUSION LUMBAR THREE-FOUR FOUR-FIVE;  Surgeon: Eustace Moore, MD;  Location: [place] [facility];  Service: Neurosurgery;  Laterality: N/A;  ? OOPHORECTOMY    ? wedge section not rem  ? REPLACEMENT TOTAL KNEE Left 11  ? REPLACEMENT TOTAL KNEE    ? RIGHT/LEFT HEART CATH AND CORONARY ANGIOGRAPHY N/A 10/26/2020  ? Procedure: RIGHT/LEFT HEART CATH AND CORONARY ANGIOGRAPHY;  Surgeon: Leonie Man, MD;  Location: [place] CV LAB;  Service: Cardiovascular;  Laterality: N/A;  ? TEE  WITHOUT CARDIOVERSION N/A 07/07/2018  ? Procedure: TRANSESOPHAGEAL ECHOCARDIOGRAM (TEE);  Surgeon: [name], MD;  Location: [place];  Service: Cardiovascular;  Laterality: N/A;  ? TEE WITHOUT CARDIOVERSION N/A 09/21/2018  ? Procedure: TRANSESOPHAGEAL ECHOCARDIOGRAM (TEE);  Surgeon: Acie Fredrickson Wonda Cheng, MD;  Location: [place];  Service: Cardiovascular;  Laterality: N/A;  ? TONSILLECTOMY    ? TUBAL LIGATION    ? ? ?There were no vitals filed for this visit. ? ? Subjective Assessment - 11/21/21 0930   ? ? Subjective Reports waking up 3x/night with cramps in the legs,  Reports that she has more pain in the back   ? Currently in Pain? Yes   ? Pain Score 7    ? Pain Location Back   ? Pain Descriptors / Indicators Sore;Cramping;Tightness   ? ?  ?  ? ?  ? ? ? ? ? ? ? ? ? ? ? ? ? ? ? ? ? ? ? ? [place] Adult PT Treatment/Exercise - 11/21/21 0001   ? ?  ? Lumbar Exercises: Stretches  ? Gastroc Stretch Right;Left;4 reps;20 seconds   ?  ? Lumbar Exercises: Aerobic  ? Nustep level 4 x 6 minutes   ?  ? Lumbar Exercises: Machines for Strengthening  ? Other Lumbar Machine Exercise 5# straight arm pulls cues for posture and core activation   ?  ? Lumbar Exercises: Seated  ? Other Seated Lumbar Exercises green tband hip abd and flex 15 each   ? Other Seated Lumbar Exercises ball b/n knees , clams green tband, green tband hip flexion, green tband ankle PF/DF 2x10 each, heel raises, toe raises, isometric abs with ball   ?  ? Modalities  ? Modalities Electrical Stimulation;Moist Heat   ?  ? Moist Heat Therapy  ? Number Minutes Moist Heat 10 Minutes   ? Moist Heat Location Lumbar Spine   ?  ? Electrical Stimulation  ? Electrical Stimulation Location lumbar   ? Electrical Stimulation Action IFC   ? Electrical Stimulation Parameters sitting   ? Electrical Stimulation Goals Pain   ? ?  ?  ? ?  ? ? ? ? ? ? ? ? ? ? ? ? PT Short Term Goals - 11/13/21 0937   ? ?  ? PT SHORT TERM GOAL #1  ? Title independent with initial HEP   ?  Status Achieved   ? ?  ?  ? ?  ? ? ? ? PT Long Term Goals - 11/21/21 1002   ? ?  ? PT LONG TERM GOAL #1  ? Title report pain decreased 25%   ? Status Partially Met   ?  ? PT LONG TERM GOAL #2  ? Title increase lumbar ROM 25%   ? Status  Partially Met   ? ?  ?  ? ?  ? ? ? ? ? ? ? ? Plan - 11/21/21 1001   ? ? Clinical Impression Statement Patient is reporting incresaed pain over the past week without the prednisone, she also is reporting cramps at night and not sleeping well, reports cramps in the calves and the shins.  Did not tolerate the exercises today, did most in sitting   ? PT Next Visit Plan she reports that she will see MD next week   ? Consulted and Agree with Plan of Care Patient   ? ?  ?  ? ?  ? ? ?Patient will benefit from skilled therapeutic intervention in order to improve the following deficits and impairments:  Abnormal gait, Decreased range of motion, Difficulty walking, Decreased mobility, Decreased strength, Postural dysfunction, Improper body mechanics, Impaired flexibility, Decreased balance, Pain, Decreased activity tolerance, Increased muscle spasms ? ?Visit Diagnosis: ?Acute bilateral low back pain without sciatica ? ?Difficulty in walking, not elsewhere classified ? ?Acute pain of right knee ? ?Muscle spasm of back ? ? ? ? ?Problem List ?Patient Active Problem List  ? Diagnosis Date Noted  ? Shortness of breath   ? Unstable angina ([place]) 10/23/2020  ? Secondary hypercoagulable state ([place]) 10/15/2020  ? Encounter for therapeutic drug monitoring 08/17/2018  ? CKD (chronic kidney disease) stage 3, GFR 30-59 ml/min (HCC) 07/21/2018  ? Chronic anemia 07/21/2018  ? Anemia   ? Heme positive stool   ? Atrial fibrillation with RVR ([place]) 07/04/2018  ? Carotid stenosis   ? Bruit of left carotid artery 09/22/2017  ? Aortic stenosis   ? Heart murmur 09/09/2016  ? Morbid obesity ([place]) 02/05/2016  ? Dry eye 05/17/2015  ? Type 2 diabetes mellitus without complication ([place]) 03/79/5583  ? Breast cancer, left  breast ([place]) 07/22/2014  ? Obesity, Class III, BMI 40-49.9 (morbid obesity) ([place]) 07/06/2014  ? Macular pseudohole 05/17/2014  ? History of surgical procedure 05/17/2014  ? OSA (obstructive sleep apnea) 03/26/201

## 2021-11-26 ENCOUNTER — Ambulatory Visit: Payer: Medicare PPO | Attending: Neurological Surgery | Admitting: Physical Therapy

## 2021-11-26 ENCOUNTER — Encounter: Payer: Self-pay | Admitting: Physical Therapy

## 2021-11-26 DIAGNOSIS — M545 Low back pain, unspecified: Secondary | ICD-10-CM | POA: Diagnosis not present

## 2021-11-26 DIAGNOSIS — R262 Difficulty in walking, not elsewhere classified: Secondary | ICD-10-CM | POA: Diagnosis not present

## 2021-11-26 DIAGNOSIS — M25561 Pain in right knee: Secondary | ICD-10-CM | POA: Diagnosis not present

## 2021-11-26 DIAGNOSIS — M62830 Muscle spasm of back: Secondary | ICD-10-CM | POA: Diagnosis not present

## 2021-11-26 NOTE — Therapy (Signed)
[place] ?[place] ?[address] ?[place], [place], [address] ?Phone: [phone]   Fax:  [phone] ? ?Physical Therapy Treatment ? ?Patient Details  ?Name: Jenna Mcdonald ?MRN: [id number] ?Date of Birth: May 26, 1946 ?Referring Provider (PT): Eustace Moore ? ? ?Encounter Date: 11/26/2021 ? ? PT End of Session - 11/26/21 1016   ? ? Visit Number 8   ? Date for PT Re-Evaluation 12/17/21   ? Authorization Type [employer]   ? PT Start Time [phone]   ? PT Stop Time 1015   ? PT Time Calculation (min) 48 min   ? Activity Tolerance Patient tolerated treatment well   ? Behavior During Therapy [facility] for tasks assessed/performed   ? ?  ?  ? ?  ? ? ?Past Medical History:  ?Diagnosis Date  ? Anemia   ? Aortic stenosis   ? mild by echo 09/2020 with mean AVG 67mHg  ? Breast CA ([place]   ? left  ? Breast cancer ([facility]   ? Carotid stenosis   ? 1-39% left  ? Degenerative disc disease, lumbar   ? of the knees  ? Diabetes mellitus   ? Dysrhythmia   ? 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[place]   ? Fatigue   ? Severe secondary to to gemfibrozil  ? Gastroesophageal reflux disease   ? denies  ? GI bleeding   ? a. 06/2018: EGD showed multiple nonbleeding duodenal ulcers. 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  ? Hammertoe   ? left second toe  ? Hypercholesteremia   ? Hypertension   ? Insomnia   ? Irritable bowel   ? Low back pain   ? Metabolic syndrome   ? Morbid obesity ([place]   ? OSA on CPAP \  ? bipap  ? Osteoarthritis   ? Osteopenia   ? Persistent atrial fibrillation ([facility]   ? s/p afib ablation - Dr. ARayann Heman1/2020  ? Personal history of radiation therapy   ? Pneumonia   ? PONV (postoperative nausea and vomiting)   ? Sigmoid diverticulosis   ? Thalassemia minor   ? Vitamin D deficiency   ? ? ?Past Surgical History:  ?Procedure Laterality Date  ? ATRIAL FIBRILLATION ABLATION N/A 09/21/2018  ? Procedure: ATRIAL FIBRILLATION ABLATION;  Surgeon: AThompson Grayer MD;  [facility];  Service: Cardiovascular;  Laterality: N/A;  ? BIOPSY  07/06/2018  ? Procedure: BIOPSY;  Surgeon: MClarene Essex MD;  [place]  Service: Endoscopy;;  ? BREAST LUMPECTOMY Left 05/28/2009  ? BREAST SURGERY Left 10  ? lumpectomy  ? CARDIOVERSION N/A 07/07/2018  ? Procedure: CARDIOVERSION;  Surgeon: RSkeet Latch MD;  [place]  Service: Cardiovascular;  Laterality: N/A;  ? CARDIOVERSION N/A 07/14/2018  ? Procedure: CARDIOVERSION;  Surgeon: MLarey Dresser MD;  [facility];  Service: Cardiovascular;  Laterality: N/A;  ? CESAREAN SECTION    ? COLONOSCOPY WITH PROPOFOL N/A 07/06/2018  ? Procedure: COLONOSCOPY WITH PROPOFOL;  Surgeon: MClarene Essex MD;  Location: [place]  Service: Endoscopy;  Laterality: N/A;  ? CYSTECTOMY  70  ? pilonidal   ? DILATION AND CURETTAGE OF UTERUS    ? ESOPHAGOGASTRODUODENOSCOPY (EGD) WITH PROPOFOL N/A 07/06/2018  ? Procedure: ESOPHAGOGASTRODUODENOSCOPY (EGD) WITH PROPOFOL;  Surgeon: MClarene Essex MD;  [place]  Service: Endoscopy;  Laterality: N/A;  ? EYE SURGERY Bilateral 12/14  ? cataracts  ? HOT HEMOSTASIS N/A 07/06/2018  ? Procedure: HOT HEMOSTASIS (ARGON  PLASMA COAGULATION/BICAP);  Surgeon: Clarene Essex, MD;  Location: [place];  Service: Endoscopy;  Laterality: N/A;  ? MAXIMUM ACCESS (MAS)POSTERIOR LUMBAR INTERBODY FUSION (PLIF) 2 LEVEL N/A 09/22/2013  ? Procedure: FOR MAXIMUM ACCESS  POSTERIOR LUMBAR INTERBODY FUSION LUMBAR THREE-FOUR FOUR-FIVE;  Surgeon: Eustace Moore, MD;  Location: [place] NEURO ORS;  Service: Neurosurgery;  Laterality: N/A;  ? OOPHORECTOMY    ? wedge section not rem  ? REPLACEMENT TOTAL KNEE Left 11  ? REPLACEMENT TOTAL KNEE    ? RIGHT/LEFT HEART CATH AND CORONARY ANGIOGRAPHY N/A 10/26/2020  ? Procedure: RIGHT/LEFT HEART CATH AND CORONARY ANGIOGRAPHY;  Surgeon: Leonie Man, MD;  Location: [place] CV LAB;  Service: Cardiovascular;  Laterality: N/A;  ? TEE  WITHOUT CARDIOVERSION N/A 07/07/2018  ? Procedure: TRANSESOPHAGEAL ECHOCARDIOGRAM (TEE);  Surgeon: [name], MD;  Location: [place];  Service: Cardiovascular;  Laterality: N/A;  ? TEE WITHOUT CARDIOVERSION N/A 09/21/2018  ? Procedure: TRANSESOPHAGEAL ECHOCARDIOGRAM (TEE);  Surgeon: Acie Fredrickson Wonda Cheng, MD;  Location: [place];  Service: Cardiovascular;  Laterality: N/A;  ? TONSILLECTOMY    ? TUBAL LIGATION    ? ? ?There were no vitals filed for this visit. ? ? Subjective Assessment - 11/26/21 0926   ? ? Subjective Patient will see the back MD this Thursday, she reports that on her own she changer her medication as she feels that the medication was causing the cramps, she reports less cramps since the change   ? Currently in Pain? Yes   ? Pain Score 6    ? Pain Location Back   ? Pain Orientation Right;Lower   ? Pain Descriptors / Indicators Sore;Tightness;Aching   ? Aggravating Factors  activity   ? ?  ?  ? ?  ? ? ? ? ? ? ? ? ? ? ? ? ? ? ? ? ? ? ? ? [place] Adult PT Treatment/Exercise - 11/26/21 0001   ? ?  ? Transfers  ? Comments patient inquired about getting up from floor if she falls due to knee issues, we did some problem solving, I demostrated  and then had her try a back to couch and she was elevated off the floor, needed Mod A to get off the floor   ?  ? Ambulation/Gait  ? Gait Comments gait outside across the parking [place] a fe curbs and a small slope, this was very difficult for her pain, SOB, weakness   ?  ? Lumbar Exercises: Stretches  ? Passive Hamstring Stretch Right;Left;3 reps;20 seconds   ? Piriformis Stretch Right;Left;4 reps;20 seconds   ? Gastroc Stretch Right;Left;4 reps;20 seconds   ?  ? Lumbar Exercises: Aerobic  ? Nustep level 5 x 6 minutes   ?  ? Lumbar Exercises: Supine  ? Other Supine Lumbar Exercises feet on ball K2C, trunk rotation, isometric abs   ?  ? Manual Therapy  ? Manual Therapy Manual Traction   ? Manual Traction light manual sheet traction   ? ?  ?  ? ?   ? ? ? ? ? ? ? ? ? ? ? ? PT Short Term Goals - 11/13/21 0937   ? ?  ? PT SHORT TERM GOAL #1  ? Title independent with initial HEP   ? Status Achieved   ? ?  ?  ? ?  ? ? ? ? PT Long Term Goals - 11/21/21 1002   ? ?  ? PT LONG TERM GOAL #1  ? Title report pain decreased 25%   ?  Status Partially Met   ?  ? PT LONG TERM GOAL #2  ? Title increase lumbar ROM 25%   ? Status Partially Met   ? ?  ?  ? ?  ? ? ? ? ? ? ? ? Plan - 11/26/21 1017   ? ? Clinical Impression Statement I walked with patient about 250 feet, this was a struggle for her, has issues with knees and caused low back pain, she was also SOB, she asked about how to get up from floor if she had a fall.  We tried to problem solve and demo this and she needed mod A to get up from her back side and elevated 13' off the floor, she will see MD Thursday, talked about the sheet traction due to the pressure on the calves she did not tolerate well, but thought it helped the back a little   ? PT Next Visit Plan see what MD says   ? Consulted and Agree with Plan of Care Patient   ? ?  ?  ? ?  ? ? ?Patient will benefit from skilled therapeutic intervention in order to improve the following deficits and impairments:  Abnormal gait, Decreased range of motion, Difficulty walking, Decreased mobility, Decreased strength, Postural dysfunction, Improper body mechanics, Impaired flexibility, Decreased balance, Pain, Decreased activity tolerance, Increased muscle spasms ? ?Visit Diagnosis: ?Acute bilateral low back pain without sciatica ? ?Acute pain of right knee ? ?Difficulty in walking, not elsewhere classified ? ?Muscle spasm of back ? ? ? ? ?Problem List ?Patient Active Problem List  ? Diagnosis Date Noted  ? Shortness of breath   ? Unstable angina ([place]) 10/23/2020  ? Secondary hypercoagulable state ([place]) 10/15/2020  ? Encounter for therapeutic drug monitoring 08/17/2018  ? CKD (chronic kidney disease) stage 3, GFR 30-59 ml/min (HCC) 07/21/2018  ? Chronic anemia 07/21/2018  ?  Anemia   ? Heme positive stool   ? Atrial fibrillation with RVR ([place]) 07/04/2018  ? Carotid stenosis   ? Bruit of left carotid artery 09/22/2017  ? Aortic stenosis   ? Heart murmur 09/09/2016  ? Morbid obesity ([place]) 06/1

## 2021-11-28 ENCOUNTER — Ambulatory Visit: Payer: Medicare PPO | Admitting: Physical Therapy

## 2021-11-28 DIAGNOSIS — M25552 Pain in left hip: Secondary | ICD-10-CM | POA: Diagnosis not present

## 2021-11-28 DIAGNOSIS — M545 Low back pain, unspecified: Secondary | ICD-10-CM

## 2021-11-28 DIAGNOSIS — M25561 Pain in right knee: Secondary | ICD-10-CM | POA: Diagnosis not present

## 2021-11-28 DIAGNOSIS — R262 Difficulty in walking, not elsewhere classified: Secondary | ICD-10-CM | POA: Diagnosis not present

## 2021-11-28 DIAGNOSIS — M62830 Muscle spasm of back: Secondary | ICD-10-CM | POA: Diagnosis not present

## 2021-11-28 DIAGNOSIS — M25551 Pain in right hip: Secondary | ICD-10-CM | POA: Diagnosis not present

## 2021-11-28 NOTE — Therapy (Signed)
[place] ?[place] ?[address] ?[place], [place], [address] ?Phone: [phone]   Fax:  [phone] ? ?Physical Therapy Treatment ? ?Patient Details  ?Name: Jenna Mcdonald ?MRN: [id number] ?Date of Birth: [birth year] ?Referring Provider (PT): Eustace Moore ? ? ?Encounter Date: 11/28/2021 ? ? PT End of Session - 11/28/21 1004   ? ? Visit Number 9   ? Number of Visits 13   ? Date for PT Re-Evaluation 12/17/21   ? Authorization Type [employer]   ? PT Start Time [phone]   ? PT Stop Time 1001   ? PT Time Calculation (min) 36 min   ? ?  ?  ? ?  ? ? ?Past Medical History:  ?Diagnosis Date  ? Anemia   ? Aortic stenosis   ? mild by echo 09/2020 with mean AVG 89mHg  ? Breast CA ([place]   ? left  ? Breast cancer ([place]   ? Carotid stenosis   ? 1-39% left  ? Degenerative disc disease, lumbar   ? of the knees  ? Diabetes mellitus   ? Dysrhythmia   ? 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[facility]   ? Fatigue   ? Severe secondary to to gemfibrozil  ? Gastroesophageal reflux disease   ? denies  ? GI bleeding   ? a. 06/2018: EGD showed multiple nonbleeding duodenal ulcers. 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  ? Hammertoe   ? left second toe  ? Hypercholesteremia   ? Hypertension   ? Insomnia   ? Irritable bowel   ? Low back pain   ? Metabolic syndrome   ? Morbid obesity ([place]   ? OSA on CPAP \  ? bipap  ? Osteoarthritis   ? Osteopenia   ? Persistent atrial fibrillation ([facility]   ? s/p afib ablation - Dr. ARayann Heman1/2020  ? Personal history of radiation therapy   ? Pneumonia   ? PONV (postoperative nausea and vomiting)   ? Sigmoid diverticulosis   ? Thalassemia minor   ? Vitamin D deficiency   ? ? ?Past Surgical History:  ?Procedure Laterality Date  ? ATRIAL FIBRILLATION ABLATION N/A 09/21/2018  ? Procedure: ATRIAL FIBRILLATION ABLATION;  Surgeon: AThompson Grayer MD;  [facility];  Service: Cardiovascular;  Laterality: N/A;  ? BIOPSY   07/06/2018  ? Procedure: BIOPSY;  Surgeon: MClarene Essex MD;  [place]  Service: Endoscopy;;  ? BREAST LUMPECTOMY Left 05/28/2009  ? BREAST SURGERY Left 10  ? lumpectomy  ? CARDIOVERSION N/A 07/07/2018  ? Procedure: CARDIOVERSION;  Surgeon: RSkeet Latch MD;  [place]  Service: Cardiovascular;  Laterality: N/A;  ? CARDIOVERSION N/A 07/14/2018  ? Procedure: CARDIOVERSION;  Surgeon: MLarey Dresser MD;  [facility];  Service: Cardiovascular;  Laterality: N/A;  ? CESAREAN SECTION    ? COLONOSCOPY WITH PROPOFOL N/A 07/06/2018  ? Procedure: COLONOSCOPY WITH PROPOFOL;  Surgeon: MClarene Essex MD;  [place]  Service: Endoscopy;  Laterality: N/A;  ? CYSTECTOMY  70  ? pilonidal   ? DILATION AND CURETTAGE OF UTERUS    ? ESOPHAGOGASTRODUODENOSCOPY (EGD) WITH PROPOFOL N/A 07/06/2018  ? Procedure: ESOPHAGOGASTRODUODENOSCOPY (EGD) WITH PROPOFOL;  Surgeon: MClarene Essex MD;  [place]  Service: Endoscopy;  Laterality: N/A;  ? EYE SURGERY Bilateral 12/14  ? cataracts  ? HOT HEMOSTASIS N/A 07/06/2018  ? Procedure: HOT HEMOSTASIS (ARGON PLASMA COAGULATION/BICAP);  Surgeon: MClarene Essex MD;  [place]  Service: Endoscopy;  Laterality: N/A;  ? MAXIMUM ACCESS (MAS)POSTERIOR LUMBAR INTERBODY FUSION (PLIF) 2 LEVEL N/A 09/22/2013  ? Procedure: FOR MAXIMUM ACCESS  POSTERIOR LUMBAR INTERBODY FUSION LUMBAR THREE-FOUR FOUR-FIVE;  Surgeon: Eustace Moore, MD;  Location: [place] NEURO ORS;  Service: Neurosurgery;  Laterality: N/A;  ? OOPHORECTOMY    ? wedge section not rem  ? REPLACEMENT TOTAL KNEE Left 11  ? REPLACEMENT TOTAL KNEE    ? RIGHT/LEFT HEART CATH AND CORONARY ANGIOGRAPHY N/A 10/26/2020  ? Procedure: RIGHT/LEFT HEART CATH AND CORONARY ANGIOGRAPHY;  Surgeon: Leonie Man, MD;  Location: [place] CV LAB;  Service: Cardiovascular;  Laterality: N/A;  ? TEE WITHOUT CARDIOVERSION N/A 07/07/2018  ? Procedure: TRANSESOPHAGEAL ECHOCARDIOGRAM (TEE);  Surgeon:  [name], MD;  Location: [place];  Service: Cardiovascular;  Laterality: N/A;  ? TEE WITHOUT CARDIOVERSION N/A 09/21/2018  ? Procedure: TRANSESOPHAGEAL ECHOCARDIOGRAM (TEE);  Surgeon: Acie [place] Wonda Cheng, MD;  Location: [place];  Service: Cardiovascular;  Laterality: N/A;  ? TONSILLECTOMY    ? TUBAL LIGATION    ? ? ?There were no vitals filed for this visit. ? ? Subjective Assessment - 11/28/21 0932   ? ? Subjective back is bad, trouble standing. seeing MD at 20   ? Currently in Pain? Yes   ? Pain Score 6    ? Pain Location Back   ? ?  ?  ? ?  ? ? ? ? ? ? ? ? ? ? ? ? ? ? ? ? ? ? ? ? [place] Adult PT Treatment/Exercise - 11/28/21 0001   ? ?  ? Lumbar Exercises: Aerobic  ? Nustep level 5 x 6 minutes   ?  ? Lumbar Exercises: Seated  ? Other Seated Lumbar Exercises green tband row and ext 2 sets 10 on dyna disc   pelvic ROM on dyna disc 10 x 4 ways, LAQ  ? Other Seated Lumbar Exercises ball b/n knees , clams green tband, green tband hip flexion, green tband ankle PF/DF 2x10 each, heel raises, toe raises, isometric abs with ball   ?  ? Manual Therapy  ? Manual therapy comments attempetd mech tratcion but machine is broke   ? ?  ?  ? ?  ? ? ? ? ? ? ? ? ? ? ? ? PT Short Term Goals - 11/13/21 0937   ? ?  ? PT SHORT TERM GOAL #1  ? Title independent with initial HEP   ? Status Achieved   ? ?  ?  ? ?  ? ? ? ? PT Long Term Goals - 11/28/21 1005   ? ?  ? PT LONG TERM GOAL #1  ? Title report pain decreased 25%   ? Status Partially Met   ?  ? PT LONG TERM GOAL #2  ? Title increase lumbar ROM 25%   ? Status Partially Met   ?  ? PT LONG TERM GOAL #3  ? Baseline regressed   ? Status Partially Met   ? ?  ?  ? ?  ? ? ? ? ? ? ? ? Plan - 11/28/21 1005   ? ? Clinical Impression Statement regression since off prednisone. tolerates ex better in sitting. attempetd mech traction but our machine was broke. seeing MD at 31   ? PT Next Visit Plan see what MD says   ? ?  ?  ? ?  ? ? ?Patient will benefit from skilled therapeutic  intervention in order to improve the following  deficits and impairments:  Abnormal gait, Decreased range of motion, Difficulty walking, Decreased mobility, Decreased strength, Postural dysfunction, Improper body mechanics, Impaired flexibility, Decreased balance, Pain, Decreased activity tolerance, Increased muscle spasms ? ?Visit Diagnosis: ?Acute bilateral low back pain without sciatica ? ?Difficulty in walking, not elsewhere classified ? ? ? ? ?Problem List ?Patient Active Problem List  ? Diagnosis Date Noted  ? Shortness of breath   ? Unstable angina ([place]) 10/23/2020  ? Secondary hypercoagulable state ([place]) 10/15/2020  ? Encounter for therapeutic drug monitoring 08/17/2018  ? CKD (chronic kidney disease) stage 3, GFR 30-59 ml/min (HCC) 07/21/2018  ? Chronic anemia 07/21/2018  ? Anemia   ? Heme positive stool   ? Atrial fibrillation with RVR ([place]) 07/04/2018  ? Carotid stenosis   ? Bruit of left carotid artery 09/22/2017  ? Aortic stenosis   ? Heart murmur 09/09/2016  ? Morbid obesity ([place]) 02/05/2016  ? Dry eye 05/17/2015  ? Type 2 diabetes mellitus without complication ([place]) 43/56/8616  ? Breast cancer, left breast ([place]) 07/22/2014  ? Obesity, Class III, BMI 40-49.9 (morbid obesity) ([place]) 07/06/2014  ? Macular pseudohole 05/17/2014  ? History of surgical procedure 05/17/2014  ? OSA (obstructive sleep apnea) 11/17/2013  ? S/P lumbar spinal fusion 09/22/2013  ? Persistent atrial fibrillation ([place]) 09/11/2011  ?  Class: Acute  ? 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[place]) 09/11/2011  ?  Class: Chronic  ? Essential hypertension 09/11/2011  ? Exogenous obesity 09/11/2011  ? Gastroesophageal reflux disease 09/11/2011  ? Thalassemia minor 09/11/2011  ?  Class: Chronic  ? Breast cancer ([place]) 09/11/2011  ?  Class: Chronic  ? Degenerative joint disease 09/11/2011  ?  Class: Chronic  ? Irritable bowel disease 09/11/2011  ?  Class: Chronic  ? Lumbar disc disease 09/11/2011  ? ? ?Jenna Mcdonald,Jenna Mcdonald, Jenna Mcdonald ?11/28/2021, 10:06 AM ? ?[facility] ?[address] ?[address] ?[place], [place], [address] ?Phone: [phone]   Fax:  [phone] ? ?Name: Jenna Mcdonald ?MRN: [id number] ?Date of Birth: June 24, 1946

## 2021-12-03 ENCOUNTER — Ambulatory Visit: Payer: Medicare PPO | Admitting: Physical Therapy

## 2021-12-03 DIAGNOSIS — M25561 Pain in right knee: Secondary | ICD-10-CM | POA: Diagnosis not present

## 2021-12-03 DIAGNOSIS — R262 Difficulty in walking, not elsewhere classified: Secondary | ICD-10-CM

## 2021-12-03 DIAGNOSIS — M545 Low back pain, unspecified: Secondary | ICD-10-CM | POA: Diagnosis not present

## 2021-12-03 DIAGNOSIS — M62830 Muscle spasm of back: Secondary | ICD-10-CM | POA: Diagnosis not present

## 2021-12-03 NOTE — Therapy (Signed)
[facility] ?[place], [place], [address] ?Phone: [phone]   Fax:  [phone] ?Progress Note ?Reporting Period 10/24/21 to 12/03/21 ? ?See note below for Objective Data and Assessment of Progress/Goals.  ? ?  ?Physical Therapy Treatment ? ?Patient Details  ?Name: Jenna Mcdonald ?MRN: [id number] ?Date of Birth: [birth year] ?Referring Provider (PT): Eustace Moore ? ? ?Encounter Date: 12/03/2021 ? ? PT End of Session - 12/03/21 0954   ? ? Visit Number 10   ? Number of Visits 13   ? Date for PT Re-Evaluation 12/17/21   ? Authorization Type [employer]   ? PT Start Time [phone]   ? PT Stop Time 1010   ? PT Time Calculation (min) 45 min   ? ?  ?  ? ?  ? ? ?Past Medical History:  ?Diagnosis Date  ? Anemia   ? Aortic stenosis   ? mild by echo 09/2020 with mean AVG 88mHg  ? Breast CA ([place]   ? left  ? Breast cancer ([place]   ? Carotid stenosis   ? 1-39% left  ? Degenerative disc disease, lumbar   ? of the knees  ? Diabetes mellitus   ? Dysrhythmia   ? 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[place]   ? Fatigue   ? Severe secondary to to gemfibrozil  ? Gastroesophageal reflux disease   ? denies  ? GI bleeding   ? a. 06/2018: EGD showed multiple nonbleeding duodenal ulcers. 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  ? Hammertoe   ? left second toe  ? Hypercholesteremia   ? Hypertension   ? Insomnia   ? Irritable bowel   ? Low back pain   ? Metabolic syndrome   ? Morbid obesity ([place]   ? OSA on CPAP \  ? bipap  ? Osteoarthritis   ? Osteopenia   ? Persistent atrial fibrillation ([facility]   ? s/p afib ablation - Dr. ARayann Heman1/2020  ? Personal history of radiation therapy   ? Pneumonia   ? PONV (postoperative nausea and vomiting)   ? Sigmoid diverticulosis   ? Thalassemia minor   ? Vitamin D deficiency   ? ? ?Past Surgical History:  ?Procedure Laterality Date  ? ATRIAL FIBRILLATION ABLATION N/A 09/21/2018  ? Procedure: ATRIAL  FIBRILLATION ABLATION;  Surgeon: AThompson Grayer MD;  [facility];  Service: Cardiovascular;  Laterality: N/A;  ? BIOPSY  07/06/2018  ? Procedure: BIOPSY;  Surgeon: MClarene Essex MD;  [place]  Service: Endoscopy;;  ? BREAST LUMPECTOMY Left 05/28/2009  ? BREAST SURGERY Left 10  ? lumpectomy  ? CARDIOVERSION N/A 07/07/2018  ? Procedure: CARDIOVERSION;  Surgeon: RSkeet Latch MD;  [place]  Service: Cardiovascular;  Laterality: N/A;  ? CARDIOVERSION N/A 07/14/2018  ? Procedure: CARDIOVERSION;  Surgeon: MLarey Dresser MD;  [facility];  Service: Cardiovascular;  Laterality: N/A;  ? CESAREAN SECTION    ? COLONOSCOPY WITH PROPOFOL N/A 07/06/2018  ? Procedure: COLONOSCOPY WITH PROPOFOL;  Surgeon: MClarene Essex MD;  [place]  Service: Endoscopy;  Laterality: N/A;  ? CYSTECTOMY  70  ? pilonidal   ? DILATION AND CURETTAGE OF UTERUS    ? ESOPHAGOGASTRODUODENOSCOPY (EGD) WITH PROPOFOL N/A 07/06/2018  ? Procedure: ESOPHAGOGASTRODUODENOSCOPY (EGD) WITH PROPOFOL;  Surgeon: MClarene Essex MD;  [place]  Service: Endoscopy;  Laterality: N/A;  ? EYE SURGERY Bilateral 12/14  ? cataracts  ? HOT  HEMOSTASIS N/A 07/06/2018  ? Procedure: HOT HEMOSTASIS (ARGON PLASMA COAGULATION/BICAP);  Surgeon: Clarene Essex, MD;  Location: [facility];  Service: Endoscopy;  Laterality: N/A;  ? MAXIMUM ACCESS (MAS)POSTERIOR LUMBAR INTERBODY FUSION (PLIF) 2 LEVEL N/A 09/22/2013  ? Procedure: FOR MAXIMUM ACCESS  POSTERIOR LUMBAR INTERBODY FUSION LUMBAR THREE-FOUR FOUR-FIVE;  Surgeon: Eustace Moore, MD;  Location: [place] NEUR[facility] ORS;  Service: Neurosurgery;  Laterality: N/A;  ? OOPHORECTOMY    ? wedge section not rem  ? REPLACEMENT TOTAL KNEE Left 11  ? REPLACEMENT TOTAL KNEE    ? RIGHT/LEFT HEART CATH AND CORONARY ANGIOGRAPHY N/A 10/26/2020  ? Procedure: RIGHT/LEFT HEART CATH AND CORONARY ANGIOGRAPHY;  Surgeon: Leonie Man, MD;  Location: [place] CV LAB;  Service:  Cardiovascular;  Laterality: N/A;  ? TEE WITHOUT CARDIOVERSION N/A 07/07/2018  ? Procedure: TRANSESOPHAGEAL ECHOCARDIOGRAM (TEE);  Surgeon: [name], MD;  Location: [place];  Service: Cardiovascular;  Laterality: N/A;  ? TEE WITHOUT CARDIOVERSION N/A 09/21/2018  ? Procedure: TRANSESOPHAGEAL ECHOCARDIOGRAM (TEE);  Surgeon: Acie Fredrickson Wonda Cheng, MD;  Location: [facility];  Service: Cardiovascular;  Laterality: N/A;  ? TONSILLECTOMY    ? TUBAL LIGATION    ? ? ?There were no vitals filed for this visit. ? ? Subjective Assessment - 12/03/21 0924   ? ? Subjective 5 mg prednisone daily f0r 30 dyas. no idea what problem is?   ? Currently in Pain? Yes   ? Pain Score 5    ? Pain Location Back   ? ?  ?  ? ?  ? ? ? ? ? ? ? ? ? ? ? ? ? ? ? ? ? ? ? ? [facility] Adult PT Treatment/Exercise - 12/03/21 0001   ? ?  ? Lumbar Exercises: Aerobic  ? Nustep level 5 x 6 minutes   ?  ? Lumbar Exercises: Machines for Strengthening  ? Cybex Lumbar Extension black tband flex and ext 20 x   ? Other Lumbar Machine Exercise lat pull and row 20# 2 sets 10   ?  ? Lumbar Exercises: Standing  ? Other Standing Lumbar Exercises yellow tband hip flex,ext and abd 10 each   ?  ? Lumbar Exercises: Seated  ? Sit to Stand 10 reps   wt ball press  ?  ? Modalities  ? Modalities Traction   ?  ? Traction  ? Type of Traction Lumbar   ? Max (lbs) 45   ? Time 10   ? ?  ?  ? ?  ? ? ? ? ? ? ? ? ? ? ? ? PT Short Term Goals - 11/13/21 0937   ? ?  ? PT SHORT TERM GOAL #1  ? Title independent with initial HEP   ? Status Achieved   ? ?  ?  ? ?  ? ? ? ? PT Long Term Goals - 12/03/21 0954   ? ?  ? PT LONG TERM GOAL #1  ? Title report pain decreased 25%   ? Status Partially Met   ?  ? PT LONG TERM GOAL #2  ? Title increase lumbar ROM 25%   ? Status Partially Met   ?  ? PT LONG TERM GOAL #3  ? Title report tolerance to standing at 10 minutes to cook a meal   ? Baseline about 5 min   ? Status Partially Met   ?  ? PT LONG TERM GOAL #4  ? Title report that she can walk 10  minutes   ?  Baseline currently reports that she cannot walk more than 5 minutes   ? Status Partially Met   ?  ? PT LONG TERM GOAL #5  ? Title increase LE strength to 4+/5   ? Status Partially Met   ? ?  ?  ? ?  ? ? ? ? ? ? ? ? Plan - 12/03/21 0956   ? ? Clinical Impression Statement pt was doing well but when prednisone stopped ain and limitations returned. MD follow up and they are not sure where pain is coming from -low dose prednisose for 30 days. will focus on strengthening while pain is less so progressed ther ex today and she did well. trial of traction. progressing with goals   ? PT Treatment/Interventions ADLs/Self Care Home Management;Cryotherapy;Electrical Stimulation;Moist Heat;Gait training;Stair training;Functional mobility training;Iontophoresis 4m/ml Dexamethasone;Therapeutic activities;Therapeutic exercise;Balance training;Neuromuscular re-education;Manual techniques;Patient/family education;Dry needling   ? PT Next Visit Plan assess traction , progress ex   ? ?  ?  ? ?  ? ? ?Patient will benefit from skilled therapeutic intervention in order to improve the following deficits and impairments:  Abnormal gait, Decreased range of motion, Difficulty walking, Decreased mobility, Decreased strength, Postural dysfunction, Improper body mechanics, Impaired flexibility, Decreased balance, Pain, Decreased activity tolerance, Increased muscle spasms ? ?Visit Diagnosis: ?Acute bilateral low back pain without sciatica ? ?Difficulty in walking, not elsewhere classified ? ? ? ? ?Problem List ?Patient Active Problem List  ? Diagnosis Date Noted  ? Shortness of breath   ? Unstable angina ([place] 10/23/2020  ? Secondary hypercoagulable state ([facility] 10/15/2020  ? Encounter for therapeutic drug monitoring 08/17/2018  ? CKD (chronic kidney disease) stage 3, GFR 30-59 ml/min (HCC) 07/21/2018  ? Chronic anemia 07/21/2018  ? Anemia   ? Heme positive stool   ? Atrial fibrillation with RVR ([place] 07/04/2018  ? Carotid stenosis    ? Bruit of left carotid artery 09/22/2017  ? Aortic stenosis   ? Heart murmur 09/09/2016  ? Morbid obesity ([facility] 02/05/2016  ? Dry eye 05/17/2015  ? Type 2 diabetes mellitus without complication ([facility] 105/69/7948

## 2021-12-05 ENCOUNTER — Encounter: Payer: Self-pay | Admitting: Physical Therapy

## 2021-12-05 ENCOUNTER — Ambulatory Visit (INDEPENDENT_AMBULATORY_CARE_PROVIDER_SITE_OTHER): Payer: Medicare PPO | Admitting: *Deleted

## 2021-12-05 ENCOUNTER — Ambulatory Visit: Payer: Medicare PPO | Admitting: Physical Therapy

## 2021-12-05 DIAGNOSIS — M545 Low back pain, unspecified: Secondary | ICD-10-CM

## 2021-12-05 DIAGNOSIS — I4891 Unspecified atrial fibrillation: Secondary | ICD-10-CM | POA: Diagnosis not present

## 2021-12-05 DIAGNOSIS — R262 Difficulty in walking, not elsewhere classified: Secondary | ICD-10-CM | POA: Diagnosis not present

## 2021-12-05 DIAGNOSIS — M25561 Pain in right knee: Secondary | ICD-10-CM

## 2021-12-05 DIAGNOSIS — Z5181 Encounter for therapeutic drug level monitoring: Secondary | ICD-10-CM

## 2021-12-05 DIAGNOSIS — M62830 Muscle spasm of back: Secondary | ICD-10-CM

## 2021-12-05 LAB — POCT INR: INR: 2.1 (ref 2.0–3.0)

## 2021-12-05 NOTE — Patient Instructions (Signed)
Description   ?Continue same dosage of Warfarin '5mg'$  daily except for the '4mg'$  on Mondays, Wednesdays and Fridays. Recheck INR in 3 weeks.  [facility] [phone] Main [phone] Call with any new medications. ? ?If pt wanted to switch to [place] per pharm D- $80/52month $40/148month  ? ? ?

## 2021-12-05 NOTE — Therapy (Signed)
[facility] ?[place] ?[address] ?[place], [place], [address] ?Phone: [phone]   Fax:  [phone] ? ?Physical Therapy Treatment ? ?Patient Details  ?Name: Jenna Mcdonald ?MRN: [id number] ?Date of Birth: Jul 28, 1946 ?Referring Provider (PT): Eustace Moore ? ? ?Encounter Date: 12/05/2021 ? ? PT End of Session - 12/05/21 0955   ? ? Visit Number 11   ? Number of Visits 13   ? Date for PT Re-Evaluation 12/17/21   ? Authorization Type [employer]   ? PT Start Time 7654   ? PT Stop Time 1008   ? PT Time Calculation (min) 48 min   ? Activity Tolerance Patient tolerated treatment well   ? Behavior During Therapy [facility] for tasks assessed/performed   ? ?  ?  ? ?  ? ? ?Past Medical History:  ?Diagnosis Date  ? Anemia   ? Aortic stenosis   ? mild by echo 09/2020 with mean AVG 37mHg  ? Breast CA ([facility]   ? left  ? Breast cancer ([facility]   ? Carotid stenosis   ? 1-39% left  ? Degenerative disc disease, lumbar   ? of the knees  ? Diabetes mellitus   ? Dysrhythmia   ? 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[place]   ? Fatigue   ? Severe secondary to to gemfibrozil  ? Gastroesophageal reflux disease   ? denies  ? GI bleeding   ? a. 06/2018: EGD showed multiple nonbleeding duodenal ulcers. 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  ? Hammertoe   ? left second toe  ? Hypercholesteremia   ? Hypertension   ? Insomnia   ? Irritable bowel   ? Low back pain   ? Metabolic syndrome   ? Morbid obesity ([facility]   ? OSA on CPAP \  ? bipap  ? Osteoarthritis   ? Osteopenia   ? Persistent atrial fibrillation ([facility]   ? s/p afib ablation - Dr. ARayann Heman1/2020  ? Personal history of radiation therapy   ? Pneumonia   ? PONV (postoperative nausea and vomiting)   ? Sigmoid diverticulosis   ? Thalassemia minor   ? Vitamin D deficiency   ? ? ?Past Surgical History:  ?Procedure Laterality Date  ? ATRIAL FIBRILLATION ABLATION N/A 09/21/2018  ? Procedure: ATRIAL FIBRILLATION  ABLATION;  Surgeon: AThompson Grayer MD;  [facility] [place];  Service: Cardiovascular;  Laterality: N/A;  ? BIOPSY  07/06/2018  ? Procedure: BIOPSY;  Surgeon: MClarene Essex MD;  [facility]  Service: Endoscopy;;  ? BREAST LUMPECTOMY Left 05/28/2009  ? BREAST SURGERY Left 10  ? lumpectomy  ? CARDIOVERSION N/A 07/07/2018  ? Procedure: CARDIOVERSION;  Surgeon: RSkeet Latch MD;  [place]  Service: Cardiovascular;  Laterality: N/A;  ? CARDIOVERSION N/A 07/14/2018  ? Procedure: CARDIOVERSION;  Surgeon: MLarey Dresser MD;  [facility];  Service: Cardiovascular;  Laterality: N/A;  ? CESAREAN SECTION    ? COLONOSCOPY WITH PROPOFOL N/A 07/06/2018  ? Procedure: COLONOSCOPY WITH PROPOFOL;  Surgeon: MClarene Essex MD;  [place]  Service: Endoscopy;  Laterality: N/A;  ? CYSTECTOMY  70  ? pilonidal   ? DILATION AND CURETTAGE OF UTERUS    ? ESOPHAGOGASTRODUODENOSCOPY (EGD) WITH PROPOFOL N/A 07/06/2018  ? Procedure: ESOPHAGOGASTRODUODENOSCOPY (EGD) WITH PROPOFOL;  Surgeon: MClarene Essex MD;  [place]  Service: Endoscopy;  Laterality: N/A;  ? EYE SURGERY Bilateral 12/14  ? cataracts  ? HOT HEMOSTASIS N/A  07/06/2018  ? Procedure: HOT HEMOSTASIS (ARGON PLASMA COAGULATION/BICAP);  Surgeon: Clarene Essex, MD;  Location: [place];  Service: Endoscopy;  Laterality: N/A;  ? MAXIMUM ACCESS (MAS)POSTERIOR LUMBAR INTERBODY FUSION (PLIF) 2 LEVEL N/A 09/22/2013  ? Procedure: FOR MAXIMUM ACCESS  POSTERIOR LUMBAR INTERBODY FUSION LUMBAR THREE-FOUR FOUR-FIVE;  Surgeon: Eustace Moore, MD;  Location: [place] NEURO ORS;  Service: Neurosurgery;  Laterality: N/A;  ? OOPHORECTOMY    ? wedge section not rem  ? REPLACEMENT TOTAL KNEE Left 11  ? REPLACEMENT TOTAL KNEE    ? RIGHT/LEFT HEART CATH AND CORONARY ANGIOGRAPHY N/A 10/26/2020  ? Procedure: RIGHT/LEFT HEART CATH AND CORONARY ANGIOGRAPHY;  Surgeon: Leonie Man, MD;  Location: [place] CV LAB;  Service: Cardiovascular;   Laterality: N/A;  ? TEE WITHOUT CARDIOVERSION N/A 07/07/2018  ? Procedure: TRANSESOPHAGEAL ECHOCARDIOGRAM (TEE);  Surgeon: [name], MD;  Location: [place];  Service: Cardiovascular;  Laterality: N/A;  ? TEE WITHOUT CARDIOVERSION N/A 09/21/2018  ? Procedure: TRANSESOPHAGEAL ECHOCARDIOGRAM (TEE);  Surgeon: Acie Fredrickson Wonda Cheng, MD;  Location: [place];  Service: Cardiovascular;  Laterality: N/A;  ? TONSILLECTOMY    ? TUBAL LIGATION    ? ? ?There were no vitals filed for this visit. ? ? Subjective Assessment - 12/05/21 0926   ? ? Subjective I think the traction helped some.  I have been busy   ? Currently in Pain? Yes   ? Pain Score 5    ? Pain Location Back   ? Pain Orientation Right;Lower   ? Pain Descriptors / Indicators Sore   ? Aggravating Factors  activity   ? Pain Relieving Factors prednisone and traction   ? ?  ?  ? ?  ? ? ? ? ? ? ? ? ? ? ? ? ? ? ? ? ? ? ? ? [place] Adult PT Treatment/Exercise - 12/05/21 0001   ? ?  ? Lumbar Exercises: Aerobic  ? Nustep level 5 x 6 minutes   ?  ? Lumbar Exercises: Machines for Strengthening  ? Cybex Lumbar Extension black tband flex and ext 20 x   ? Other Lumbar Machine Exercise lat pull and row 20# 2 sets 10   ?  ? Lumbar Exercises: Supine  ? Other Supine Lumbar Exercises butterfly stretch with help   ? Other Supine Lumbar Exercises feet on ball K2C, trunk rotation, isometric abs   ?  ? Traction  ? Type of Traction Lumbar   ? Max (lbs) 48   ? Time 12   ? ?  ?  ? ?  ? ? ? ? ? ? ? ? ? ? ? ? PT Short Term Goals - 11/13/21 0937   ? ?  ? PT SHORT TERM GOAL #1  ? Title independent with initial HEP   ? Status Achieved   ? ?  ?  ? ?  ? ? ? ? PT Long Term Goals - 12/03/21 0954   ? ?  ? PT LONG TERM GOAL #1  ? Title report pain decreased 25%   ? Status Partially Met   ?  ? PT LONG TERM GOAL #2  ? Title increase lumbar ROM 25%   ? Status Partially Met   ?  ? PT LONG TERM GOAL #3  ? Title report tolerance to standing at 10 minutes to cook a meal   ? Baseline about 5 min   ?  Status Partially Met   ?  ? PT LONG TERM GOAL #4  ?  Title report that she can walk 10 minutes   ? Baseline currently reports that she cannot walk more than 5 minutes   ? Status Partially Met   ?  ? PT LONG TERM GOAL #5  ? Title increase LE strength to 4+/5   ? Status Partially Met   ? ?  ?  ? ?  ? ? ? ? ? ? ? ? Plan - 12/05/21 0957   ? ? Clinical Impression Statement Patient reported that she thought the last treatment helped, I replicated this and upped the traction by 3 # and 2 minutes.  She is moving better and reports feeling stronger and tolerating cleaning house better.  We will need to reauth her insurance for any visits past 13 and past 12/17/21   ? PT Next Visit Plan continue witht he current plan and do reauth for [facility] next week   ? ?  ?  ? ?  ? ? ?Patient will benefit from skilled therapeutic intervention in order to improve the following deficits and impairments:  Abnormal gait, Decreased range of motion, Difficulty walking, Decreased mobility, Decreased strength, Postural dysfunction, Improper body mechanics, Impaired flexibility, Decreased balance, Pain, Decreased activity tolerance, Increased muscle spasms ? ?Visit Diagnosis: ?Acute bilateral low back pain without sciatica ? ?Difficulty in walking, not elsewhere classified ? ?Muscle spasm of back ? ?Acute pain of right knee ? ? ? ? ?Problem List ?Patient Active Problem List  ? Diagnosis Date Noted  ? Shortness of breath   ? Unstable angina ([place]) 10/23/2020  ? Secondary hypercoagulable state ([place]) 10/15/2020  ? Encounter for therapeutic drug monitoring 08/17/2018  ? CKD (chronic kidney disease) stage 3, GFR 30-59 ml/min (HCC) 07/21/2018  ? Chronic anemia 07/21/2018  ? Anemia   ? Heme positive stool   ? Atrial fibrillation with RVR ([place]) 07/04/2018  ? Carotid stenosis   ? Bruit of left carotid artery 09/22/2017  ? Aortic stenosis   ? Heart murmur 09/09/2016  ? Morbid obesity ([place]) 02/05/2016  ? Dry eye 05/17/2015  ? Type 2 diabetes mellitus without  complication ([place]) 66/59/9357  ? Breast cancer, left breast ([place]) 07/22/2014  ? Obesity, Class III, BMI 40-49.9 (morbid obesity) ([place]) 07/06/2014  ? Macular pseudohole 05/17/2014  ? History of surgical procedu

## 2021-12-10 ENCOUNTER — Ambulatory Visit: Payer: Medicare PPO | Admitting: Physical Therapy

## 2021-12-10 ENCOUNTER — Encounter: Payer: Self-pay | Admitting: Physical Therapy

## 2021-12-10 DIAGNOSIS — M25561 Pain in right knee: Secondary | ICD-10-CM | POA: Diagnosis not present

## 2021-12-10 DIAGNOSIS — R262 Difficulty in walking, not elsewhere classified: Secondary | ICD-10-CM | POA: Diagnosis not present

## 2021-12-10 DIAGNOSIS — M545 Low back pain, unspecified: Secondary | ICD-10-CM

## 2021-12-10 DIAGNOSIS — M62830 Muscle spasm of back: Secondary | ICD-10-CM

## 2021-12-10 NOTE — Therapy (Signed)
[place] [facility] ?[address] ?[place], [place], [address] ?Phone: [phone]   Fax:  [phone] ? ?Physical Therapy Treatment ? ?Patient Details  ?Name: Jenna Mcdonald ?MRN: [id number] ?Date of Birth: 11/20/1945 ?Referring Provider (PT): David S Jones ? ? ?Encounter Date: 12/10/2021 ? ? PT End of Session - 12/10/21 1009   ? ? Visit Number 12   ? Number of Visits 13   ? Date for PT Re-Evaluation 12/17/21   ? Authorization Type [employer]   ? PT Start Time 0927   ? PT Stop Time 1015   ? PT Time Calculation (min) 48 min   ? Activity Tolerance Patient tolerated treatment well   ? Behavior During Therapy WFL for tasks assessed/performed   ? ?  ?  ? ?  ? ? ?Past Medical History:  ?Diagnosis Date  ? Anemia   ? Aortic stenosis   ? mild by echo 09/2020 with mean AVG 11mmHg  ? Breast CA (HCC)   ? left  ? Breast cancer (HCC)   ? Carotid stenosis   ? 1-39% left  ? Degenerative disc disease, lumbar   ? of the knees  ? Diabetes mellitus   ? Dysrhythmia   ? 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HCC)   ? Fatigue   ? Severe secondary to to gemfibrozil  ? Gastroesophageal reflux disease   ? denies  ? GI bleeding   ? a. 06/2018: EGD showed multiple nonbleeding duodenal ulcers. 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  ? Hammertoe   ? left second toe  ? Hypercholesteremia   ? Hypertension   ? Insomnia   ? Irritable bowel   ? Low back pain   ? Metabolic syndrome   ? Morbid obesity (HCC)   ? OSA on CPAP \  ? bipap  ? Osteoarthritis   ? Osteopenia   ? Persistent atrial fibrillation (HCC)   ? s/p afib ablation - Dr. Allred 08/2018  ? Personal history of radiation therapy   ? Pneumonia   ? PONV (postoperative nausea and vomiting)   ? Sigmoid diverticulosis   ? Thalassemia minor   ? Vitamin D deficiency   ? ? ?Past Surgical History:  ?Procedure Laterality Date  ? ATRIAL FIBRILLATION ABLATION N/A 09/21/2018  ? Procedure: ATRIAL FIBRILLATION  ABLATION;  Surgeon: Allred, James, MD;  [facility];  Service: Cardiovascular;  Laterality: N/A;  ? BIOPSY  07/06/2018  ? Procedure: BIOPSY;  Surgeon: Magod, Marc, MD;  [facility]: [facility];  Service: Endoscopy;;  ? BREAST LUMPECTOMY Left 05/28/2009  ? BREAST SURGERY Left 10  ? lumpectomy  ? CARDIOVERSION N/A 07/07/2018  ? Procedure: CARDIOVERSION;  Surgeon: Gaines, Tiffany, MD;  [facility]: [facility];  Service: Cardiovascular;  Laterality: N/A;  ? CARDIOVERSION N/A 07/14/2018  ? Procedure: CARDIOVERSION;  Surgeon: McLean, Dalton S, MD;  [facility];  Service: Cardiovascular;  Laterality: N/A;  ? CESAREAN SECTION    ? COLONOSCOPY WITH PROPOFOL N/A 07/06/2018  ? Procedure: COLONOSCOPY WITH PROPOFOL;  Surgeon: Magod, Marc, MD;  [facility];  Service: Endoscopy;  Laterality: N/A;  ? CYSTECTOMY  70  ? pilonidal   ? DILATION AND CURETTAGE OF UTERUS    ? ESOPHAGOGASTRODUODENOSCOPY (EGD) WITH PROPOFOL N/A 07/06/2018  ? Procedure: ESOPHAGOGASTRODUODENOSCOPY (EGD) WITH PROPOFOL;  Surgeon: Magod, Marc, MD;  [facility];  Service: Endoscopy;  Laterality: N/A;  ? EYE SURGERY Bilateral 12/14  ? cataracts  ? HOT HEMOSTASIS N/A   07/06/2018  ? Procedure: HOT HEMOSTASIS (ARGON PLASMA COAGULATION/BICAP);  Surgeon: Clarene Essex, MD;  Location: [place];  Service: Endoscopy;  Laterality: N/A;  ? MAXIMUM ACCESS (MAS)POSTERIOR LUMBAR INTERBODY FUSION (PLIF) 2 LEVEL N/A 09/22/2013  ? Procedure: FOR MAXIMUM ACCESS  POSTERIOR LUMBAR INTERBODY FUSION LUMBAR THREE-FOUR FOUR-FIVE;  Surgeon: Eustace Moore, MD;  Location: [place] [facility];  Service: Neurosurgery;  Laterality: N/A;  ? OOPHORECTOMY    ? wedge section not rem  ? REPLACEMENT TOTAL KNEE Left 11  ? REPLACEMENT TOTAL KNEE    ? RIGHT/LEFT HEART CATH AND CORONARY ANGIOGRAPHY N/A 10/26/2020  ? Procedure: RIGHT/LEFT HEART CATH AND CORONARY ANGIOGRAPHY;  Surgeon: Leonie Man, MD;  Location: [facility];  Service: Cardiovascular;   Laterality: N/A;  ? TEE WITHOUT CARDIOVERSION N/A 07/07/2018  ? Procedure: TRANSESOPHAGEAL ECHOCARDIOGRAM (TEE);  Surgeon: [name], MD;  Location: [place];  Service: Cardiovascular;  Laterality: N/A;  ? TEE WITHOUT CARDIOVERSION N/A 09/21/2018  ? Procedure: TRANSESOPHAGEAL ECHOCARDIOGRAM (TEE);  Surgeon: Acie Fredrickson Wonda Cheng, MD;  Location: [facility];  Service: Cardiovascular;  Laterality: N/A;  ? TONSILLECTOMY    ? TUBAL LIGATION    ? ? ?There were no vitals filed for this visit. ? ? Subjective Assessment - 12/10/21 0930   ? ? Subjective I really do think the traction helps   ? Currently in Pain? Yes   ? Pain Score 4    ? Pain Location Back   ? Pain Orientation Lower   ? Pain Descriptors / Indicators Sore   ? Pain Relieving Factors traction really helps   ? ?  ?  ? ?  ? ? ? ? ? ? ? ? ? ? ? ? ? ? ? ? ? ? ? ? [facility] Adult PT Treatment/Exercise - 12/10/21 0001   ? ?  ? Lumbar Exercises: Stretches  ? Passive Hamstring Stretch Right;Left;3 reps;20 seconds   ? Single Knee to Chest Stretch Right;Left;3 reps;10 seconds   ? Piriformis Stretch Right;Left;4 reps;20 seconds   ?  ? Lumbar Exercises: Aerobic  ? Nustep level 5 x 6 minutes   ?  ? Lumbar Exercises: Machines for Strengthening  ? Cybex Lumbar Extension black tband flex and ext 20 x   ? Cybex Knee Flexion 20# 2x15   ? Leg Press no weight x10, 20# x 10   ? Other Lumbar Machine Exercise lat pull and row 20# 2 sets 10   ?  ? Lumbar Exercises: Supine  ? Bridge 15 reps   ? Bridge with [employer] 10 reps   ? Bridge with clamshell 15 reps   ?  ? Traction  ? Type of Traction Lumbar   ? Max (lbs) 46   ? Time 12   ? ?  ?  ? ?  ? ? ? ? ? ? ? ? ? ? ? ? PT Short Term Goals - 11/13/21 0937   ? ?  ? PT SHORT TERM GOAL #1  ? Title independent with initial HEP   ? Status Achieved   ? ?  ?  ? ?  ? ? ? ? PT Long Term Goals - 12/10/21 1013   ? ?  ? PT LONG TERM GOAL #1  ? Title report pain decreased 25%   ? Status Partially Met   ?  ? PT LONG TERM GOAL #2  ? Title increase  lumbar ROM 25%   ? Status Partially Met   ?  ? PT LONG TERM GOAL #  3  ? Title report tolerance to standing at 10 minutes to cook a meal   ? Status Partially Met   ? ?  ?  ? ?  ? ? ? ? ? ? ? ? Plan - 12/10/21 1011   ? ? Clinical Impression Statement Patient very pleased with her progress, thinking she is getting stronger and moving better and overall less pain.  She was able to do various bridges today without a lot of diffiuclty and this she was not able to do when she first came here, has diffiuclty relaxing, I also added leg press   ? PT Next Visit Plan will need to do [employer] request for more visits   ? Consulted and Agree with Plan of Care Patient   ? ?  ?  ? ?  ? ? ?Patient will benefit from skilled therapeutic intervention in order to improve the following deficits and impairments:  Abnormal gait, Decreased range of motion, Difficulty walking, Decreased mobility, Decreased strength, Postural dysfunction, Improper body mechanics, Impaired flexibility, Decreased balance, Pain, Decreased activity tolerance, Increased muscle spasms ? ?Visit Diagnosis: ?Acute bilateral low back pain without sciatica ? ?Difficulty in walking, not elsewhere classified ? ?Muscle spasm of back ? ?Acute pain of right knee ? ? ? ? ?Problem List ?Patient Active Problem List  ? Diagnosis Date Noted  ? Shortness of breath   ? Unstable angina (HCC) 10/23/2020  ? Secondary hypercoagulable state (HCC) 10/15/2020  ? Encounter for therapeutic drug monitoring 08/17/2018  ? CKD (chronic kidney disease) stage 3, GFR 30-59 ml/min (HCC) 07/21/2018  ? Chronic anemia 07/21/2018  ? Anemia   ? Heme positive stool   ? Atrial fibrillation with RVR (HCC) 07/04/2018  ? Carotid stenosis   ? Bruit of left carotid artery 09/22/2017  ? Aortic stenosis   ? Heart murmur 09/09/2016  ? Morbid obesity (HCC) 02/05/2016  ? Dry eye 05/17/2015  ? Type 2 diabetes mellitus without complication (HCC) 08/10/2014  ? Breast cancer, left breast (HCC) 07/22/2014  ? Obesity, Class  III, BMI 40-49.9 (morbid obesity) (HCC) 07/06/2014  ? Macular pseudohole 05/17/2014  ? History of surgical procedure 05/17/2014  ? OSA (obstructive sleep apnea) 11/17/2013  ? S/P lumbar spinal fusion 01/2

## 2021-12-12 ENCOUNTER — Encounter: Payer: Self-pay | Admitting: Cardiology

## 2021-12-13 ENCOUNTER — Ambulatory Visit: Payer: Medicare PPO | Admitting: Physical Therapy

## 2021-12-13 DIAGNOSIS — M545 Low back pain, unspecified: Secondary | ICD-10-CM | POA: Diagnosis not present

## 2021-12-13 DIAGNOSIS — M62830 Muscle spasm of back: Secondary | ICD-10-CM | POA: Diagnosis not present

## 2021-12-13 DIAGNOSIS — R262 Difficulty in walking, not elsewhere classified: Secondary | ICD-10-CM

## 2021-12-13 DIAGNOSIS — M25561 Pain in right knee: Secondary | ICD-10-CM

## 2021-12-13 MED ORDER — DILTIAZEM HCL ER COATED BEADS 120 MG PO CP24: 120.0000 mg | ORAL_CAPSULE | Freq: Every day | ORAL | 1 refills | Status: DC

## 2021-12-13 NOTE — Therapy (Signed)
[facility] ?[place], [place], [address] ?Phone: [phone]   Fax:  [phone] ? ?Physical Therapy Treatment ? ?Patient Details  ?Name: Jenna Mcdonald ?MRN: [id number] ?Date of Birth: 08-Dec-1945 ?Referring Provider (PT): Eustace Moore ? ? ?Encounter Date: 12/13/2021 ? ? PT End of Session - 12/13/21 1006   ? ? Visit Number 13   ? Date for PT Re-Evaluation 12/17/21   ? Authorization Type [employer]   ? PT Start Time [phone]   ? PT Stop Time 1015   ? PT Time Calculation (min) 53 min   ? ?  ?  ? ?  ? ? ?Past Medical History:  ?Diagnosis Date  ? Anemia   ? Aortic stenosis   ? mild by echo 09/2020 with mean AVG 61mHg  ? Breast CA ([place]   ? left  ? Breast cancer ([facility]   ? Carotid stenosis   ? 1-39% left  ? Degenerative disc disease, lumbar   ? of the knees  ? Diabetes mellitus   ? Dysrhythmia   ? 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[facility] [facility]   ? Fatigue   ? Severe secondary to to gemfibrozil  ? Gastroesophageal reflux disease   ? denies  ? GI bleeding   ? a. 06/2018: EGD showed multiple nonbleeding duodenal ulcers. 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  ? Hammertoe   ? left second toe  ? Hypercholesteremia   ? Hypertension   ? Insomnia   ? Irritable bowel   ? Low back pain   ? Metabolic syndrome   ? Morbid obesity ([facility]   ? OSA on CPAP \  ? bipap  ? Osteoarthritis   ? Osteopenia   ? Persistent atrial fibrillation ([facility]   ? s/p afib ablation - Dr. ARayann Heman1/2020  ? Personal history of radiation therapy   ? Pneumonia   ? PONV (postoperative nausea and vomiting)   ? Sigmoid diverticulosis   ? Thalassemia minor   ? Vitamin D deficiency   ? ? ?Past Surgical History:  ?Procedure Laterality Date  ? ATRIAL FIBRILLATION ABLATION N/A 09/21/2018  ? Procedure: ATRIAL FIBRILLATION ABLATION;  Surgeon: AThompson Grayer MD;  [facility];  Service: Cardiovascular;  Laterality: N/A;  ? BIOPSY  07/06/2018  ?  Procedure: BIOPSY;  Surgeon: MClarene Essex MD;  [facility]  Service: Endoscopy;;  ? BREAST LUMPECTOMY Left 05/28/2009  ? BREAST SURGERY Left 10  ? lumpectomy  ? CARDIOVERSION N/A 07/07/2018  ? Procedure: CARDIOVERSION;  Surgeon: RSkeet Latch MD;  [facility] [facility]  Service: Cardiovascular;  Laterality: N/A;  ? CARDIOVERSION N/A 07/14/2018  ? Procedure: CARDIOVERSION;  Surgeon: MLarey Dresser MD;  [facility];  Service: Cardiovascular;  Laterality: N/A;  ? CESAREAN SECTION    ? COLONOSCOPY WITH PROPOFOL N/A 07/06/2018  ? Procedure: COLONOSCOPY WITH PROPOFOL;  Surgeon: MClarene Essex MD;  [place] [facility]  Service: Endoscopy;  Laterality: N/A;  ? CYSTECTOMY  70  ? pilonidal   ? DILATION AND CURETTAGE OF UTERUS    ? ESOPHAGOGASTRODUODENOSCOPY (EGD) WITH PROPOFOL N/A 07/06/2018  ? Procedure: ESOPHAGOGASTRODUODENOSCOPY (EGD) WITH PROPOFOL;  Surgeon: MClarene Essex MD;  [facility]  Service: Endoscopy;  Laterality: N/A;  ? EYE SURGERY Bilateral 12/14  ? cataracts  ? HOT HEMOSTASIS N/A 07/06/2018  ? Procedure: HOT HEMOSTASIS (ARGON PLASMA COAGULATION/BICAP);  Surgeon: MClarene Essex MD;  [place] [facility]  Service: Endoscopy;  Laterality: N/A;  ?  MAXIMUM ACCESS (MAS)POSTERIOR LUMBAR INTERBODY FUSION (PLIF) 2 LEVEL N/A 09/22/2013  ? Procedure: FOR MAXIMUM ACCESS  POSTERIOR LUMBAR INTERBODY FUSION LUMBAR THREE-FOUR FOUR-FIVE;  Surgeon: Eustace Moore, MD;  Location: [facility] [facility];  Service: Neurosurgery;  Laterality: N/A;  ? OOPHORECTOMY    ? wedge section not rem  ? REPLACEMENT TOTAL KNEE Left 11  ? REPLACEMENT TOTAL KNEE    ? RIGHT/LEFT HEART CATH AND CORONARY ANGIOGRAPHY N/A 10/26/2020  ? Procedure: RIGHT/LEFT HEART CATH AND CORONARY ANGIOGRAPHY;  Surgeon: Leonie Man, MD;  Location: [facility];  Service: Cardiovascular;  Laterality: N/A;  ? TEE WITHOUT CARDIOVERSION N/A 07/07/2018  ? Procedure: TRANSESOPHAGEAL ECHOCARDIOGRAM (TEE);  Surgeon: [name], MD;  Location: [facility];  Service: Cardiovascular;  Laterality: N/A;  ? TEE WITHOUT CARDIOVERSION N/A 09/21/2018  ? Procedure: TRANSESOPHAGEAL ECHOCARDIOGRAM (TEE);  Surgeon: Acie Fredrickson Wonda Cheng, MD;  Location: [facility];  Service: Cardiovascular;  Laterality: N/A;  ? TONSILLECTOMY    ? TUBAL LIGATION    ? ? ?There were no vitals filed for this visit. ? ? Subjective Assessment - 12/13/21 0921   ? ? Subjective past 2 days were rough- more ex last session but better today. alos not sleeping well. sharp pain in back is much better. awaiting knee injections. I think PT is really helping   ? Pain Score 5    ? Pain Location Back   ? Pain Orientation Lower   ? ?  ?  ? ?  ? ? ? ? ? ? ? ? ? ? ? ? ? ? ? ? ? ? ? ? [facility] Adult PT Treatment/Exercise - 12/13/21 0001   ? ?  ? Lumbar Exercises: Aerobic  ? Nustep level 5 x 6 minutes   ?  ? Lumbar Exercises: Machines for Strengthening  ? Cybex Lumbar Extension black tband flex and ext 20 x   ? Other Lumbar Machine Exercise lat pull and row 20# 2 sets 10   ?  ? Lumbar Exercises: Seated  ? Long [employer] on Chair Strengthening;Both;2 sets;10 reps;Weights   ? LAQ on Chair Weights (lbs) 3   ? Sit to Stand 10 reps   wt ball chest press  ? Other Seated Lumbar Exercises blue tband hip flex and abd 2 sets 10   ? Other Seated Lumbar Exercises green HS curl 2 sets 10   pelvic ROM on sit fit 15 x 4 way  ?  ? Lumbar Exercises: Supine  ? Ab Set 15 reps;3 seconds   ?  ? Traction  ? Type of Traction Lumbar   ? Max (lbs) 47   ? Time 15   ? ?  ?  ? ?  ? ? ? ? ? ? ? ? ? ? ? ? PT Short Term Goals - 11/13/21 0937   ? ?  ? PT SHORT TERM GOAL #1  ? Title independent with initial HEP   ? Status Achieved   ? ?  ?  ? ?  ? ? ? ? PT Long Term Goals - 12/13/21 0928   ? ?  ? PT LONG TERM GOAL #1  ? Title report pain decreased 25%   ? Status Partially Met   ?  ? PT LONG TERM GOAL #2  ? Title increase lumbar ROM 25%   ? Status Partially Met   ?  ? PT LONG TERM GOAL #3  ? Title report tolerance to  standing at 10 minutes to cook a meal   ?  Baseline about 5 min   ? Status Partially Met   ?  ? PT LONG TERM GOAL #4  ? Title report that she can walk 10 minutes   ? Baseline currently reports that she cannot walk more than 5 minutes   ? Status Partially Met   ?  ? PT LONG TERM GOAL #5  ? Title increase LE strength to 4+/5   ? Status Partially Met   ? ?  ?  ? ?  ? ? ? ? ? ? ? ? Plan - 12/13/21 0931   ? ? Clinical Impression Statement pt continues to be very pleased with progress and feels traction really helps. pt was able to do more last session but did have increased pain so adjusted tx today. progressing with goals . awaiting knee injection which she hopes will make her walk better and help back   ? PT Treatment/Interventions ADLs/Self Care Home Management;Cryotherapy;Electrical Stimulation;Moist Heat;Gait training;Stair training;Functional mobility training;Iontophoresis 53m/ml Dexamethasone;Therapeutic activities;Therapeutic exercise;Balance training;Neuromuscular re-education;Manual techniques;Patient/family education;Dry needling   ? PT Next Visit Plan submited [employer] request for more visits   ? ?  ?  ? ?  ? ? ?Patient will benefit from skilled therapeutic intervention in order to improve the following deficits and impairments:  Abnormal gait, Decreased range of motion, Difficulty walking, Decreased mobility, Decreased strength, Postural dysfunction, Improper body mechanics, Impaired flexibility, Decreased balance, Pain, Decreased activity tolerance, Increased muscle spasms ? ?Visit Diagnosis: ?Acute bilateral low back pain without sciatica ? ?Difficulty in walking, not elsewhere classified ? ?Muscle spasm of back ? ?Acute pain of right knee ? ? ? ? ?Problem List ?Patient Active Problem List  ? Diagnosis Date Noted  ? Shortness of breath   ? Unstable angina ([facility] 10/23/2020  ? Secondary hypercoagulable state ([facility] 10/15/2020  ? Encounter for therapeutic drug monitoring 08/17/2018  ? CKD (chronic kidney  disease) stage 3, GFR 30-59 ml/min (HCC) 07/21/2018  ? Chronic anemia 07/21/2018  ? Anemia   ? Heme positive stool   ? Atrial fibrillation with RVR ([facility] 07/04/2018  ? Carotid stenosis   ? Bruit of left carotid artery 01/29/2

## 2021-12-16 DIAGNOSIS — D6869 Other thrombophilia: Secondary | ICD-10-CM | POA: Diagnosis not present

## 2021-12-16 DIAGNOSIS — M47816 Spondylosis without myelopathy or radiculopathy, lumbar region: Secondary | ICD-10-CM | POA: Diagnosis not present

## 2021-12-16 DIAGNOSIS — R350 Frequency of micturition: Secondary | ICD-10-CM | POA: Diagnosis not present

## 2021-12-16 DIAGNOSIS — N1831 Chronic kidney disease, stage 3a: Secondary | ICD-10-CM | POA: Diagnosis not present

## 2021-12-16 DIAGNOSIS — R7309 Other abnormal glucose: Secondary | ICD-10-CM | POA: Diagnosis not present

## 2021-12-16 DIAGNOSIS — M179 Osteoarthritis of knee, unspecified: Secondary | ICD-10-CM | POA: Diagnosis not present

## 2021-12-16 DIAGNOSIS — G4733 Obstructive sleep apnea (adult) (pediatric): Secondary | ICD-10-CM | POA: Diagnosis not present

## 2021-12-16 DIAGNOSIS — I10 Essential (primary) hypertension: Secondary | ICD-10-CM | POA: Diagnosis not present

## 2021-12-16 DIAGNOSIS — I4891 Unspecified atrial fibrillation: Secondary | ICD-10-CM | POA: Diagnosis not present

## 2021-12-17 ENCOUNTER — Telehealth: Payer: Self-pay | Admitting: Cardiology

## 2021-12-17 NOTE — Telephone Encounter (Signed)
Routing to Dr Rayann Heman who performed ablation ?

## 2021-12-17 NOTE — Telephone Encounter (Signed)
Pt c/o medication issue: ? ?1. Name of Medication: warfarin (COUMADIN) 4 MG tablet ?warfarin (COUMADIN) 5 MG tablet ? ?2. How are you currently taking this medication (dosage and times per day)? As prescribed  ? ?3. Are you having a reaction (difficulty breathing--STAT)? No  ? ?4. What is your medication issue? Patient is calling wanting to discuss stopping coumadin since her ablation.   ?

## 2021-12-17 NOTE — Telephone Encounter (Signed)
Patient had a fib ablation on 09/21/18.  Will route to Dr Radford Pax to see if ok to be off anticoagulation. ?

## 2021-12-18 DIAGNOSIS — D6869 Other thrombophilia: Secondary | ICD-10-CM | POA: Diagnosis not present

## 2021-12-18 DIAGNOSIS — M1990 Unspecified osteoarthritis, unspecified site: Secondary | ICD-10-CM | POA: Diagnosis not present

## 2021-12-18 DIAGNOSIS — G4733 Obstructive sleep apnea (adult) (pediatric): Secondary | ICD-10-CM | POA: Diagnosis not present

## 2021-12-18 DIAGNOSIS — Z6841 Body Mass Index (BMI) 40.0 and over, adult: Secondary | ICD-10-CM | POA: Diagnosis not present

## 2021-12-18 DIAGNOSIS — Z7901 Long term (current) use of anticoagulants: Secondary | ICD-10-CM | POA: Diagnosis not present

## 2021-12-18 DIAGNOSIS — I10 Essential (primary) hypertension: Secondary | ICD-10-CM | POA: Diagnosis not present

## 2021-12-18 DIAGNOSIS — I4891 Unspecified atrial fibrillation: Secondary | ICD-10-CM | POA: Diagnosis not present

## 2021-12-18 DIAGNOSIS — E119 Type 2 diabetes mellitus without complications: Secondary | ICD-10-CM | POA: Diagnosis not present

## 2021-12-22 NOTE — Telephone Encounter (Signed)
Her chads2vasc score is quite high.  Ideally, she would stay on anticoagulation.  Could consider long term monitoring with ILR as an alternative.  She is overdue for EP follow-up. ? ?[place], please schedule to see me in [address] office. ? ?Sonia Baller, please make sure she gets preauthorization for possible ILR implant. ? ?Thanks! ?JA ?

## 2021-12-24 ENCOUNTER — Ambulatory Visit: Payer: Medicare PPO | Attending: Neurological Surgery | Admitting: Physical Therapy

## 2021-12-24 ENCOUNTER — Encounter: Payer: Self-pay | Admitting: Physical Therapy

## 2021-12-24 ENCOUNTER — Other Ambulatory Visit: Payer: Self-pay

## 2021-12-24 DIAGNOSIS — I350 Nonrheumatic aortic (valve) stenosis: Secondary | ICD-10-CM

## 2021-12-24 DIAGNOSIS — I5032 Chronic diastolic (congestive) heart failure: Secondary | ICD-10-CM

## 2021-12-24 DIAGNOSIS — M545 Low back pain, unspecified: Secondary | ICD-10-CM | POA: Diagnosis not present

## 2021-12-24 DIAGNOSIS — M25561 Pain in right knee: Secondary | ICD-10-CM | POA: Insufficient documentation

## 2021-12-24 DIAGNOSIS — M62830 Muscle spasm of back: Secondary | ICD-10-CM | POA: Insufficient documentation

## 2021-12-24 DIAGNOSIS — R262 Difficulty in walking, not elsewhere classified: Secondary | ICD-10-CM | POA: Diagnosis not present

## 2021-12-24 NOTE — Therapy (Signed)
[place] ?[address] ?[address] ?[place], [place], [address] ?Phone: [phone]   Fax:  [phone] ? ?Physical Therapy Treatment ? ?Patient Details  ?Name: Jenna Mcdonald ?MRN: [id number] ?Date of Birth: 1946/01/07 ?Referring Provider (PT): Eustace Moore ? ? ?Encounter Date: 12/24/2021 ? ? PT End of Session - 12/24/21 1043   ? ? Visit Number 14   ? Date for PT Re-Evaluation 01/31/22   ? Authorization Type [employer]   ? Authorization Time Period 1/12   ? PT Start Time 1013   ? PT Stop Time 1100   ? PT Time Calculation (min) 47 min   ? Activity Tolerance Patient tolerated treatment well   ? Behavior During Therapy [place] for tasks assessed/performed   ? ?  ?  ? ?  ? ? ?Past Medical History:  ?Diagnosis Date  ? Anemia   ? Aortic stenosis   ? mild by echo 09/2020 with mean AVG 76mHg  ? Breast CA ([place] [place]   ? left  ? Breast cancer ([facility]   ? Carotid stenosis   ? 1-39% left  ? Degenerative disc disease, lumbar   ? of the knees  ? Diabetes mellitus   ? Dysrhythmia   ? 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[place]   ? Fatigue   ? Severe secondary to to gemfibrozil  ? Gastroesophageal reflux disease   ? denies  ? GI bleeding   ? a. 06/2018: EGD showed multiple nonbleeding duodenal ulcers. 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  ? Hammertoe   ? left second toe  ? Hypercholesteremia   ? Hypertension   ? Insomnia   ? Irritable bowel   ? Low back pain   ? Metabolic syndrome   ? Morbid obesity ([place] [place]   ? OSA on CPAP \  ? bipap  ? Osteoarthritis   ? Osteopenia   ? Persistent atrial fibrillation ([place]   ? s/p afib ablation - Dr. ARayann Heman1/2020  ? Personal history of radiation therapy   ? Pneumonia   ? PONV (postoperative nausea and vomiting)   ? Sigmoid diverticulosis   ? Thalassemia minor   ? Vitamin D deficiency   ? ? ?Past Surgical History:  ?Procedure Laterality Date  ? ATRIAL FIBRILLATION ABLATION N/A 09/21/2018  ? Procedure: ATRIAL  FIBRILLATION ABLATION;  Surgeon: AThompson Grayer MD;  [facility];  Service: Cardiovascular;  Laterality: N/A;  ? BIOPSY  07/06/2018  ? Procedure: BIOPSY;  Surgeon: MClarene Essex MD;  [facility]  Service: Endoscopy;;  ? BREAST LUMPECTOMY Left 05/28/2009  ? BREAST SURGERY Left 10  ? lumpectomy  ? CARDIOVERSION N/A 07/07/2018  ? Procedure: CARDIOVERSION;  Surgeon: RSkeet Latch MD;  [facility]: [facility]  Service: Cardiovascular;  Laterality: N/A;  ? CARDIOVERSION N/A 07/14/2018  ? Procedure: CARDIOVERSION;  Surgeon: MLarey Dresser MD;  [facility];  Service: Cardiovascular;  Laterality: N/A;  ? CESAREAN SECTION    ? COLONOSCOPY WITH PROPOFOL N/A 07/06/2018  ? Procedure: COLONOSCOPY WITH PROPOFOL;  Surgeon: MClarene Essex MD;  [facility]  Service: Endoscopy;  Laterality: N/A;  ? CYSTECTOMY  70  ? pilonidal   ? DILATION AND CURETTAGE OF UTERUS    ? ESOPHAGOGASTRODUODENOSCOPY (EGD) WITH PROPOFOL N/A 07/06/2018  ? Procedure: ESOPHAGOGASTRODUODENOSCOPY (EGD) WITH PROPOFOL;  Surgeon: MClarene Essex MD;  [facility]  Service: Endoscopy;  Laterality: N/A;  ? EYE SURGERY Bilateral 12/14  ? cataracts  ? HOT HEMOSTASIS N/A  07/06/2018  ? Procedure: HOT HEMOSTASIS (ARGON PLASMA COAGULATION/BICAP);  Surgeon: Clarene Essex, MD;  Location: [facility];  Service: Endoscopy;  Laterality: N/A;  ? MAXIMUM ACCESS (MAS)POSTERIOR LUMBAR INTERBODY FUSION (PLIF) 2 LEVEL N/A 09/22/2013  ? Procedure: FOR MAXIMUM ACCESS  POSTERIOR LUMBAR INTERBODY FUSION LUMBAR THREE-FOUR FOUR-FIVE;  Surgeon: Eustace Moore, MD;  Location: [place] [facility];  Service: Neurosurgery;  Laterality: N/A;  ? OOPHORECTOMY    ? wedge section not rem  ? REPLACEMENT TOTAL KNEE Left 11  ? REPLACEMENT TOTAL KNEE    ? RIGHT/LEFT HEART CATH AND CORONARY ANGIOGRAPHY N/A 10/26/2020  ? Procedure: RIGHT/LEFT HEART CATH AND CORONARY ANGIOGRAPHY;  Surgeon: Leonie Man, MD;  Location: [facility];  Service:  Cardiovascular;  Laterality: N/A;  ? TEE WITHOUT CARDIOVERSION N/A 07/07/2018  ? Procedure: TRANSESOPHAGEAL ECHOCARDIOGRAM (TEE);  Surgeon: [name], MD;  Location: [facility];  Service: Cardiovascular;  Laterality: N/A;  ? TEE WITHOUT CARDIOVERSION N/A 09/21/2018  ? Procedure: TRANSESOPHAGEAL ECHOCARDIOGRAM (TEE);  Surgeon: Acie Fredrickson Wonda Cheng, MD;  Location: [facility];  Service: Cardiovascular;  Laterality: N/A;  ? TONSILLECTOMY    ? TUBAL LIGATION    ? ? ?There were no vitals filed for this visit. ? ? Subjective Assessment - 12/24/21 1016   ? ? Subjective Patient reports that she had a bad night a lot of LE cramps, reports that she is tired and the legs are hurting   ? Currently in Pain? Yes   ? Pain Score 8    ? Pain Location Back   legs  ? Pain Orientation Lower   ? Pain Descriptors / Indicators Cramping;Aching;Sore;Spasm   ? Aggravating Factors  activity   ? ?  ?  ? ?  ? ? ? ? ? ? ? ? ? ? ? ? ? ? ? ? ? ? ? ? [place] [facility] Adult PT Treatment/Exercise - 12/24/21 0001   ? ?  ? Lumbar Exercises: Stretches  ? Gastroc Stretch Right;Left;4 reps;20 seconds   ? Gastroc Stretch Limitations seated   ?  ? Lumbar Exercises: Aerobic  ? Nustep level 5 x 6 minutes   ?  ? Lumbar Exercises: Machines for Strengthening  ? Other Lumbar Machine Exercise lat pull and row 20# 2 sets 10   ? Other Lumbar Machine Exercise 5# straight arm pulls cues for posture and core activation   ?  ? Lumbar Exercises: Standing  ? Other Standing Lumbar Exercises standing hip abduction caused pain   ?  ? Lumbar Exercises: Seated  ? Long [employer] on Chair Strengthening;Both;2 sets;10 reps;Weights   ? LAQ on Chair Weights (lbs) 3   ? Other Seated Lumbar Exercises marches , heel raise and toe raise   ? Other Seated Lumbar Exercises green HS curl 2 sets 10   ?  ? Traction  ? Type of Traction Lumbar   ? Max (lbs) 50   ? Time 15   ? ?  ?  ? ?  ? ? ? ? ? ? ? ? ? ? ? ? PT Short Term Goals - 11/13/21 0937   ? ?  ? PT SHORT TERM GOAL #1  ? Title independent  with initial HEP   ? Status Achieved   ? ?  ?  ? ?  ? ? ? ? PT Long Term Goals - 12/24/21 1045   ? ?  ? PT LONG TERM GOAL #1  ? Title report pain decreased 25%   ? Status Partially Met   ?  ?  PT LONG TERM GOAL #2  ? Title increase lumbar ROM 25%   ? Status Partially Met   ? ?  ?  ? ?  ? ? ? ? ? ? ? ? Plan - 12/24/21 1044   ? ? Clinical Impression Statement Patient reports a bad night, reports a lot of cramps in the legs and not sleeping, she reports back is okay but the legs really are bothering her today.  She had pain in the right knee with the standing exercises so went back to more sitting exercises   ? PT Next Visit Plan will try to help her back, we talked about the cramps and she is thinking it could be cholesterol meds   ? Consulted and Agree with Plan of Care Patient   ? ?  ?  ? ?  ? ? ?Patient will benefit from skilled therapeutic intervention in order to improve the following deficits and impairments:  Abnormal gait, Decreased range of motion, Difficulty walking, Decreased mobility, Decreased strength, Postural dysfunction, Improper body mechanics, Impaired flexibility, Decreased balance, Pain, Decreased activity tolerance, Increased muscle spasms ? ?Visit Diagnosis: ?Acute bilateral low back pain without sciatica ? ?Difficulty in walking, not elsewhere classified ? ?Muscle spasm of back ? ?Acute pain of right knee ? ? ? ? ?Problem List ?Patient Active Problem List  ? Diagnosis Date Noted  ? Shortness of breath   ? Unstable angina ([place]) 10/23/2020  ? Secondary hypercoagulable state ([place]) 10/15/2020  ? Encounter for therapeutic drug monitoring 08/17/2018  ? CKD (chronic kidney disease) stage 3, GFR 30-59 ml/min (HCC) 07/21/2018  ? Chronic anemia 07/21/2018  ? Anemia   ? Heme positive stool   ? Atrial fibrillation with RVR ([place]) 07/04/2018  ? Carotid stenosis   ? Bruit of left carotid artery 09/22/2017  ? Aortic stenosis   ? Heart murmur 09/09/2016  ? Morbid obesity ([place]) 02/05/2016  ? Dry eye 05/17/2015  ?  Type 2 diabetes mellitus without complication ([place]) 21/06/5519  ? Breast cancer, left breast ([place]) 07/22/2014  ? Obesity, Class III, BMI 40-49.9 (morbid obesity) ([place]) 07/06/2014  ? Macular pseudohole 05/17/2014

## 2021-12-26 ENCOUNTER — Ambulatory Visit (INDEPENDENT_AMBULATORY_CARE_PROVIDER_SITE_OTHER): Payer: Medicare PPO | Admitting: *Deleted

## 2021-12-26 DIAGNOSIS — M1711 Unilateral primary osteoarthritis, right knee: Secondary | ICD-10-CM | POA: Diagnosis not present

## 2021-12-26 DIAGNOSIS — I4891 Unspecified atrial fibrillation: Secondary | ICD-10-CM

## 2021-12-26 DIAGNOSIS — Z5181 Encounter for therapeutic drug level monitoring: Secondary | ICD-10-CM

## 2021-12-26 LAB — POCT INR: INR: 3.1 — AB (ref 2.0–3.0)

## 2021-12-26 NOTE — Patient Instructions (Addendum)
Description   ?Today take 2.'5mg'$  (1/2 tablet) then continue taking Warfarin '5mg'$  daily except for the '4mg'$  on Mondays, Wednesdays and Fridays. Recheck INR in 3 weeks.  [facility] [phone] Main [phone] Call with any new medications. ? ?If pt wanted to switch to [facility] per pharm D- $80/36month $40/180month  ?  ? ?

## 2022-01-01 ENCOUNTER — Ambulatory Visit: Payer: Medicare PPO | Admitting: Physical Therapy

## 2022-01-01 ENCOUNTER — Encounter: Payer: Self-pay | Admitting: Physical Therapy

## 2022-01-01 DIAGNOSIS — M545 Low back pain, unspecified: Secondary | ICD-10-CM

## 2022-01-01 DIAGNOSIS — R262 Difficulty in walking, not elsewhere classified: Secondary | ICD-10-CM | POA: Diagnosis not present

## 2022-01-01 DIAGNOSIS — M25561 Pain in right knee: Secondary | ICD-10-CM | POA: Diagnosis not present

## 2022-01-01 DIAGNOSIS — M62830 Muscle spasm of back: Secondary | ICD-10-CM | POA: Diagnosis not present

## 2022-01-01 NOTE — Therapy (Signed)
[facility] ?[address] ?[address] ?[place], [place], [address] ?Phone: [phone]   Fax:  [phone] ? ?Physical Therapy Treatment ? ?Patient Details  ?Name: Jenna Mcdonald ?MRN: [id number] ?Date of Birth: 1945-11-21 ?Referring Provider (PT): Eustace Moore ? ? ?Encounter Date: 01/01/2022 ? ? PT End of Session - 01/01/22 1022   ? ? Visit Number 15   ? Date for PT Re-Evaluation 01/31/22   ? Authorization Type Hum[employer]   ? Authorization Time Period 2/12   ? PT Start Time [phone]   ? PT Stop Time 1016   ? PT Time Calculation (min) 50 min   ? Activity Tolerance Patient tolerated treatment well   ? Behavior During Therapy [facility] for tasks assessed/performed   ? ?  ?  ? ?  ? ? ?Past Medical History:  ?Diagnosis Date  ? Anemia   ? Aortic stenosis   ? mild by echo 09/2020 with mean AVG 39mHg  ? Breast CA ([facility]   ? left  ? Breast cancer ([facility]   ? Carotid stenosis   ? 1-39% left  ? Degenerative disc disease, lumbar   ? of the knees  ? Diabetes mellitus   ? Dysrhythmia   ? 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[facility] [place]   ? Fatigue   ? Severe secondary to to gemfibrozil  ? Gastroesophageal reflux disease   ? denies  ? GI bleeding   ? a. 06/2018: EGD showed multiple nonbleeding duodenal ulcers. 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  ? Hammertoe   ? left second toe  ? Hypercholesteremia   ? Hypertension   ? Insomnia   ? Irritable bowel   ? Low back pain   ? Metabolic syndrome   ? Morbid obesity ([facility]   ? OSA on CPAP \  ? bipap  ? Osteoarthritis   ? Osteopenia   ? Persistent atrial fibrillation ([facility]   ? s/p afib ablation - Dr. ARayann Heman1/2020  ? Personal history of radiation therapy   ? Pneumonia   ? PONV (postoperative nausea and vomiting)   ? Sigmoid diverticulosis   ? Thalassemia minor   ? Vitamin D deficiency   ? ? ?Past Surgical History:  ?Procedure Laterality Date  ? ATRIAL FIBRILLATION ABLATION N/A 09/21/2018  ? Procedure: ATRIAL  FIBRILLATION ABLATION;  Surgeon: AThompson Grayer MD;  [facility];  Service: Cardiovascular;  Laterality: N/A;  ? BIOPSY  07/06/2018  ? Procedure: BIOPSY;  Surgeon: MClarene Essex MD;  [facility]: [facility]  Service: Endoscopy;;  ? BREAST LUMPECTOMY Left 05/28/2009  ? BREAST SURGERY Left 10  ? lumpectomy  ? CARDIOVERSION N/A 07/07/2018  ? Procedure: CARDIOVERSION;  Surgeon: RSkeet Latch MD;  Location: [facility]  Service: Cardiovascular;  Laterality: N/A;  ? CARDIOVERSION N/A 07/14/2018  ? Procedure: CARDIOVERSION;  Surgeon: MLarey Dresser MD;  [facility];  Service: Cardiovascular;  Laterality: N/A;  ? CESAREAN SECTION    ? COLONOSCOPY WITH PROPOFOL N/A 07/06/2018  ? Procedure: COLONOSCOPY WITH PROPOFOL;  Surgeon: MClarene Essex MD;  [facility]: [facility]  Service: Endoscopy;  Laterality: N/A;  ? CYSTECTOMY  70  ? pilonidal   ? DILATION AND CURETTAGE OF UTERUS    ? ESOPHAGOGASTRODUODENOSCOPY (EGD) WITH PROPOFOL N/A 07/06/2018  ? Procedure: ESOPHAGOGASTRODUODENOSCOPY (EGD) WITH PROPOFOL;  Surgeon: MClarene Essex MD;  [facility]: [facility]  Service: Endoscopy;  Laterality: N/A;  ? EYE SURGERY Bilateral 12/14  ? cataracts  ? HOT HEMOSTASIS N/A  07/06/2018  ? Procedure: HOT HEMOSTASIS (ARGON PLASMA COAGULATION/BICAP);  Surgeon: Clarene Essex, MD;  Location: [facility];  Service: Endoscopy;  Laterality: N/A;  ? MAXIMUM ACCESS (MAS)POSTERIOR LUMBAR INTERBODY FUSION (PLIF) 2 LEVEL N/A 09/22/2013  ? Procedure: FOR MAXIMUM ACCESS  POSTERIOR LUMBAR INTERBODY FUSION LUMBAR THREE-FOUR FOUR-FIVE;  Surgeon: Eustace Moore, MD;  Location: [place] [facility];  Service: Neurosurgery;  Laterality: N/A;  ? OOPHORECTOMY    ? wedge section not rem  ? REPLACEMENT TOTAL KNEE Left 11  ? REPLACEMENT TOTAL KNEE    ? RIGHT/LEFT HEART CATH AND CORONARY ANGIOGRAPHY N/A 10/26/2020  ? Procedure: RIGHT/LEFT HEART CATH AND CORONARY ANGIOGRAPHY;  Surgeon: Leonie Man, MD;  Location: [place] [facility];  Service:  Cardiovascular;  Laterality: N/A;  ? TEE WITHOUT CARDIOVERSION N/A 07/07/2018  ? Procedure: TRANSESOPHAGEAL ECHOCARDIOGRAM (TEE);  Surgeon: [name], MD;  Location: [facility];  Service: Cardiovascular;  Laterality: N/A;  ? TEE WITHOUT CARDIOVERSION N/A 09/21/2018  ? Procedure: TRANSESOPHAGEAL ECHOCARDIOGRAM (TEE);  Surgeon: Acie Fredrickson Wonda Cheng, MD;  Location: [place];  Service: Cardiovascular;  Laterality: N/A;  ? TONSILLECTOMY    ? TUBAL LIGATION    ? ? ?There were no vitals filed for this visit. ? ? Subjective Assessment - 01/01/22 0926   ? ? Subjective I had 3 really good days after the last session   ? Currently in Pain? Yes   ? Pain Score 7    ? Pain Location Back   ? Pain Orientation Lower   ? Pain Descriptors / Indicators Sore   ? Aggravating Factors  vacuum   ? ?  ?  ? ?  ? ? ? ? ? ? ? ? ? ? ? ? ? ? ? ? ? ? ? ? [place] Adult PT Treatment/Exercise - 01/01/22 0001   ? ?  ? Lumbar Exercises: Stretches  ? Passive Hamstring Stretch Right;Left;3 reps;20 seconds   ? Piriformis Stretch Right;Left;4 reps;20 seconds   ? Gastroc Stretch Right;Left;4 reps;20 seconds   ? Gastroc Stretch Limitations seated   ?  ? Lumbar Exercises: Aerobic  ? Nustep level 5 x 6 minutes   ?  ? Lumbar Exercises: Machines for Strengthening  ? Cybex Lumbar Extension black tband flex and ext 20 x   ? Other Lumbar Machine Exercise lat pull and row 20# 2 sets 10, black tband trunk flexion and extension   ? Other Lumbar Machine Exercise 5# straight arm pulls cues for posture and core activation   ?  ? Lumbar Exercises: Standing  ? Other Standing Lumbar Exercises standing hip abduction caused pain   ?  ? Lumbar Exercises: Seated  ? Other Seated Lumbar Exercises green HS curl 2 sets 10   ?  ? Lumbar Exercises: Supine  ? Other Supine Lumbar Exercises butterfly stretch with help   ? Other Supine Lumbar Exercises feet on ball K2C, trunk rotation, isometric abs   ?  ? Traction  ? Type of Traction Lumbar   ? Max (lbs) 50   ? Time 15   ? ?  ?   ? ?  ? ? ? ? ? ? ? ? ? ? ? ? PT Short Term Goals - 11/13/21 0937   ? ?  ? PT SHORT TERM GOAL #1  ? Title independent with initial HEP   ? Status Achieved   ? ?  ?  ? ?  ? ? ? ? PT Long Term Goals - 01/01/22 1028   ? ?  ?  PT LONG TERM GOAL #1  ? Title report pain decreased 25%   ? Status Partially Met   ?  ? PT LONG TERM GOAL #2  ? Title increase lumbar ROM 25%   ? Status Partially Met   ?  ? PT LONG TERM GOAL #3  ? Title report tolerance to standing at 10 minutes to cook a meal   ? Status Partially Met   ? ?  ?  ? ?  ? ? ? ? ? ? ? ? Plan - 01/01/22 1022   ? ? Clinical Impression Statement We have been seeing 1x/week due to her having a series of knee injections.  She reports a great relief over a few days after the last treatment,  She has a difficulty with a lot of the exercises and has a lot of cramps in the legs.  Very tight in the piriformis   ? PT Next Visit Plan will work on the strength and function   ? Consulted and Agree with Plan of Care Patient   ? ?  ?  ? ?  ? ? ?Patient will benefit from skilled therapeutic intervention in order to improve the following deficits and impairments:  Abnormal gait, Decreased range of motion, Difficulty walking, Decreased mobility, Decreased strength, Postural dysfunction, Improper body mechanics, Impaired flexibility, Decreased balance, Pain, Decreased activity tolerance, Increased muscle spasms ? ?Visit Diagnosis: ?Acute bilateral low back pain without sciatica ? ?Difficulty in walking, not elsewhere classified ? ?Muscle spasm of back ? ? ? ? ?Problem List ?Patient Active Problem List  ? Diagnosis Date Noted  ? Shortness of breath   ? Unstable angina ([place]) 10/23/2020  ? Secondary hypercoagulable state ([place]) 10/15/2020  ? Encounter for therapeutic drug monitoring 08/17/2018  ? CKD (chronic kidney disease) stage 3, GFR 30-59 ml/min (HCC) 07/21/2018  ? Chronic anemia 07/21/2018  ? Anemia   ? Heme positive stool   ? Atrial fibrillation with RVR ([place]) 07/04/2018  ? Carotid  stenosis   ? Bruit of left carotid artery 09/22/2017  ? Aortic stenosis   ? Heart murmur 09/09/2016  ? Morbid obesity ([place]) 02/05/2016  ? Dry eye 05/17/2015  ? Type 2 diabetes mellitus without complication ([place]) 12

## 2022-01-02 DIAGNOSIS — M1711 Unilateral primary osteoarthritis, right knee: Secondary | ICD-10-CM | POA: Diagnosis not present

## 2022-01-08 ENCOUNTER — Ambulatory Visit: Payer: Medicare PPO | Admitting: Physical Therapy

## 2022-01-09 DIAGNOSIS — M1711 Unilateral primary osteoarthritis, right knee: Secondary | ICD-10-CM | POA: Diagnosis not present

## 2022-01-13 DIAGNOSIS — D6869 Other thrombophilia: Secondary | ICD-10-CM | POA: Diagnosis not present

## 2022-01-13 DIAGNOSIS — G4733 Obstructive sleep apnea (adult) (pediatric): Secondary | ICD-10-CM | POA: Diagnosis not present

## 2022-01-13 DIAGNOSIS — M179 Osteoarthritis of knee, unspecified: Secondary | ICD-10-CM | POA: Diagnosis not present

## 2022-01-13 DIAGNOSIS — I10 Essential (primary) hypertension: Secondary | ICD-10-CM | POA: Diagnosis not present

## 2022-01-13 DIAGNOSIS — N1831 Chronic kidney disease, stage 3a: Secondary | ICD-10-CM | POA: Diagnosis not present

## 2022-01-13 DIAGNOSIS — I4891 Unspecified atrial fibrillation: Secondary | ICD-10-CM | POA: Diagnosis not present

## 2022-01-13 DIAGNOSIS — M47816 Spondylosis without myelopathy or radiculopathy, lumbar region: Secondary | ICD-10-CM | POA: Diagnosis not present

## 2022-01-13 DIAGNOSIS — R350 Frequency of micturition: Secondary | ICD-10-CM | POA: Diagnosis not present

## 2022-01-14 ENCOUNTER — Ambulatory Visit: Payer: Medicare PPO | Admitting: Physical Therapy

## 2022-01-14 DIAGNOSIS — M545 Low back pain, unspecified: Secondary | ICD-10-CM

## 2022-01-14 DIAGNOSIS — M25561 Pain in right knee: Secondary | ICD-10-CM

## 2022-01-14 DIAGNOSIS — R262 Difficulty in walking, not elsewhere classified: Secondary | ICD-10-CM

## 2022-01-14 DIAGNOSIS — M62830 Muscle spasm of back: Secondary | ICD-10-CM | POA: Diagnosis not present

## 2022-01-14 NOTE — Therapy (Signed)
[place], [place], [address] Phone: [phone]   Fax:  [phone]  Physical Therapy Treatment  Patient Details  Name: Genisis Sonnier MRN: [id number] Date of Birth: March 11, 1946 Referring Provider (PT): Eustace Moore   Encounter Date: 01/14/2022   PT End of Session - 01/14/22 1005     Visit Number 16    Date for PT Re-Evaluation 01/31/22    Authorization Type [employer]    PT Start Time 0930    PT Stop Time 1020    PT Time Calculation (min) 50 min             Past Medical History:  Diagnosis Date   Anemia    Aortic stenosis    mild by echo 09/2020 with mean AVG 26mHg   Breast CA ([place]    left   Breast cancer ([place]    Carotid stenosis    1-39% left   Degenerative disc disease, lumbar    of the knees   Diabetes mellitus    Dysrhythmia    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[facility]    Fatigue    Severe secondary to to gemfibrozil   Gastroesophageal reflux disease    denies   GI bleeding    a. 06/2018: EGD showed multiple nonbleeding duodenal ulcers. 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   Hammertoe    left second toe   Hypercholesteremia    Hypertension    Insomnia    Irritable bowel    Low back pain    Metabolic syndrome    Morbid obesity (HCC)    OSA on CPAP \   bipap   Osteoarthritis    Osteopenia    Persistent atrial fibrillation ([facility]    s/p afib ablation - Dr. ARayann Heman1/2020   Personal history of radiation therapy    Pneumonia    PONV (postoperative nausea and vomiting)    Sigmoid diverticulosis    Thalassemia minor    Vitamin D deficiency     Past Surgical History:  Procedure Laterality Date   ATRIAL FIBRILLATION ABLATION N/A 09/21/2018   Procedure: ATRIAL FIBRILLATION ABLATION;  Surgeon: AThompson Grayer MD;  [facility];  Service: Cardiovascular;  Laterality: N/A;   BIOPSY  07/06/2018    Procedure: BIOPSY;  Surgeon: MClarene Essex MD;  [place]  Service: Endoscopy;;   BREAST LUMPECTOMY Left 05/28/2009   BREAST SURGERY Left 10   lumpectomy   CARDIOVERSION N/A 07/07/2018   Procedure: CARDIOVERSION;  Surgeon: RSkeet Latch MD;  [facility];  Service: Cardiovascular;  Laterality: N/A;   CARDIOVERSION N/A 07/14/2018   Procedure: CARDIOVERSION;  Surgeon: MLarey Dresser MD;  [facility];  Service: Cardiovascular;  Laterality: N/A;   CESAREAN SECTION     COLONOSCOPY WITH PROPOFOL N/A 07/06/2018   Procedure: COLONOSCOPY WITH PROPOFOL;  Surgeon: MClarene Essex MD;  [place]  Service: Endoscopy;  Laterality: N/A;   CYSTECTOMY  70   pilonidal    DILATION AND CURETTAGE OF UTERUS     ESOPHAGOGASTRODUODENOSCOPY (EGD) WITH PROPOFOL N/A 07/06/2018   Procedure: ESOPHAGOGASTRODUODENOSCOPY (EGD) WITH PROPOFOL;  Surgeon: MClarene Essex MD;  [place]  Service: Endoscopy;  Laterality: N/A;   EYE SURGERY Bilateral 12/14   cataracts   HOT HEMOSTASIS N/A 07/06/2018   Procedure: HOT HEMOSTASIS (ARGON PLASMA COAGULATION/BICAP);  Surgeon: MClarene Essex MD;  [place]  Service: Endoscopy;  Laterality: N/A;  MAXIMUM ACCESS (MAS)POSTERIOR LUMBAR INTERBODY FUSION (PLIF) 2 LEVEL N/A 09/22/2013   Procedure: FOR MAXIMUM ACCESS  POSTERIOR LUMBAR INTERBODY FUSION LUMBAR THREE-FOUR FOUR-FIVE;  Surgeon: Eustace Moore, MD;  Location: [place] [facility]UR[facility] ORS;  Service: Neurosurgery;  Laterality: N/A;   OOPHORECTOMY     wedge section not rem   REPLACEMENT TOTAL KNEE Left 11   REPLACEMENT TOTAL KNEE     RIGHT/LEFT HEART CATH AND CORONARY ANGIOGRAPHY N/A 10/26/2020   Procedure: RIGHT/LEFT HEART CATH AND CORONARY ANGIOGRAPHY;  Surgeon: Leonie Man, MD;  Location: [place] CV LAB;  Service: Cardiovascular;  Laterality: N/A;   TEE WITHOUT CARDIOVERSION N/A 07/07/2018   Procedure: TRANSESOPHAGEAL ECHOCARDIOGRAM (TEE);  Surgeon: [name], MD;  Location: [place];  Service: Cardiovascular;  Laterality: N/A;   TEE WITHOUT CARDIOVERSION N/A 09/21/2018   Procedure: TRANSESOPHAGEAL ECHOCARDIOGRAM (TEE);  Surgeon: Acie Fredrickson Wonda Cheng, MD;  Location: [facility];  Service: Cardiovascular;  Laterality: N/A;   TONSILLECTOMY     TUBAL LIGATION      There were no vitals filed for this visit.   Subjective Assessment - 01/14/22 0924     Subjective had shot last week could not walk so cancelled. shot in knee did not help- I think i need replacement but won't until I loose weight. i am weak and need to be stronger    Currently in Pain? Yes    Pain Score 7     Pain Location Back                               [facility] Adult PT Treatment/Exercise - 01/14/22 0001       Lumbar Exercises: Aerobic   Nustep level 5 x 5 minutes      Lumbar Exercises: Machines for Strengthening   Cybex Lumbar Extension black tband flex and ext 20 x    Other Lumbar Machine Exercise lat pull and row 20# 2 sets 10,      Lumbar Exercises: Seated   Long Arc Quad on Chair Strengthening;Both;2 sets;10 reps;Weights    LAQ on Chair Weights (lbs) 3    Sit to Stand 10 reps   wt ball press   Other Seated Lumbar Exercises hip flex and abd 2 sets 10 3#    Other Seated Lumbar Exercises green HS curl 2 sets 10      Lumbar Exercises: Supine   Ab Set 15 reps;3 seconds      Traction   Type of Traction Lumbar    Max (lbs) 50    Time 15                       PT Short Term Goals - 11/13/21 1497       PT SHORT TERM GOAL #1   Title independent with initial HEP    Status Achieved               PT Long Term Goals - 01/01/22 1028       PT LONG TERM GOAL #1   Title report pain decreased 25%    Status Partially Met      PT LONG TERM GOAL #2   Title increase lumbar ROM 25%    Status Partially Met      PT LONG TERM GOAL #3   Title report tolerance to standing at 10 minutes to cook a meal    Status Partially Met  Plan - 01/14/22 1005     Clinical Impression Statement pt arrived frustrated with pain and no relief with injections. trying to get into wt loss class so she could have TKR. pt sttaes she feels PT helps with strengthening as she feels week- slowly progressed back woth some ex for LE and core. traction as she states it gives a couple days relief. n changes withgoals d/t pain    PT Treatment/Interventions ADLs/Self Care Home Management;Cryotherapy;Electrical Stimulation;Moist Heat;Gait training;Stair training;Functional mobility training;Iontophoresis 25m/ml Dexamethasone;Therapeutic activities;Therapeutic exercise;Balance training;Neuromuscular re-education;Manual techniques;Patient/family education;Dry needling    PT Next Visit Plan will work on the strength and function             Patient will benefit from skilled therapeutic intervention in order to improve the following deficits and impairments:  Abnormal gait, Decreased range of motion, Difficulty walking, Decreased mobility, Decreased strength, Postural dysfunction, Improper body mechanics, Impaired flexibility, Decreased balance, Pain, Decreased activity tolerance, Increased muscle spasms  Visit Diagnosis: Acute bilateral low back pain without sciatica  Difficulty in walking, not elsewhere classified  Acute pain of right knee     Problem List Patient Active Problem List   Diagnosis Date Noted   Shortness of breath    Unstable angina ([facility] 10/23/2020   Secondary hypercoagulable state ([facility] 10/15/2020   Encounter for therapeutic drug monitoring 08/17/2018   CKD (chronic kidney disease) stage 3, GFR 30-59 ml/min (HCC) 07/21/2018   Chronic anemia 07/21/2018   Anemia    Heme positive stool    Atrial fibrillation with RVR (HCC) 07/04/2018   Carotid stenosis    Bruit of left carotid artery 09/22/2017   Aortic stenosis    Heart murmur 09/09/2016   Morbid obesity ([facility] 02/05/2016   Dry eye  05/17/2015   Type 2 diabetes mellitus without complication ([facility] 154/04/8118  Breast cancer, left breast ([facility] 07/22/2014   Obesity, Class III, BMI 40-49.9 (morbid obesity) ([facility] 07/06/2014   Macular pseudohole 05/17/2014   History of surgical procedure 05/17/2014   OSA (obstructive sleep apnea) 11/17/2013   S/P lumbar spinal fusion 09/22/2013   Persistent atrial fibrillation ([facility] [place] 09/11/2011    Class: Acute   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[place] 09/11/2011    Class: Chronic   Essential hypertension 09/11/2011   Exogenous obesity 09/11/2011   Gastroesophageal reflux disease 09/11/2011   Thalassemia minor 09/11/2011    Class: Chronic   Breast cancer ([facility] [place] 09/11/2011    Class: Chronic   Degenerative joint disease 09/11/2011    Class: Chronic   Irritable bowel disease 09/11/2011    Class: Chronic   Lumbar disc disease 09/11/2011    Deryl Giroux,ANGIE, PTA 01/14/2022, 10:08 AM  [place] [address]Phone: [phone]  Fax:  [phone] Name: PSuanne MinahanMRN: [id number] of Birth: 210-Feb-1947

## 2022-01-15 ENCOUNTER — Ambulatory Visit: Payer: Medicare PPO | Admitting: Internal Medicine

## 2022-01-15 ENCOUNTER — Ambulatory Visit (INDEPENDENT_AMBULATORY_CARE_PROVIDER_SITE_OTHER): Payer: Medicare PPO | Admitting: Internal Medicine

## 2022-01-15 ENCOUNTER — Encounter: Payer: Self-pay | Admitting: Internal Medicine

## 2022-01-15 VITALS — BP 140/62 | HR 77 | Ht 64.5 in | Wt 253.0 lb

## 2022-01-15 DIAGNOSIS — D6869 Other thrombophilia: Secondary | ICD-10-CM

## 2022-01-15 DIAGNOSIS — I4819 Other persistent atrial fibrillation: Secondary | ICD-10-CM

## 2022-01-15 DIAGNOSIS — I10 Essential (primary) hypertension: Secondary | ICD-10-CM

## 2022-01-15 HISTORY — PX: OTHER SURGICAL HISTORY: SHX169

## 2022-01-15 NOTE — Progress Notes (Addendum)
PCP: Josetta Huddle, MD Primary Cardiologist: Dr Radford Pax Primary EP: Dr Rayann Heman  Jenna Mcdonald is a 76 y.o. female who presents today for routine electrophysiology followup.  Since last being seen in our clinic, the patient reports doing very well.  Today, she denies symptoms of palpitations, chest pain, shortness of breath,  lower extremity edema, dizziness, presyncope, or syncope.  The patient is otherwise without complaint today.   Past Medical History:  Diagnosis Date   Anemia    Aortic stenosis    mild by echo 09/2020 with mean AVG 83mHg   Breast CA ([facility]    left   Breast cancer (HCC)    Carotid stenosis    1-39% left   Degenerative disc disease, lumbar    of the knees   Diabetes mellitus    Dysrhythmia    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HCC)    Fatigue    Severe secondary to to gemfibrozil   Gastroesophageal reflux disease    denies   GI bleeding    a. 06/2018: EGD showed multiple nonbleeding duodenal ulcers. 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   Hammertoe    left second toe   Hypercholesteremia    Hypertension    Insomnia    Irritable bowel    Low back pain    Metabolic syndrome    Morbid obesity (HCC)    OSA on CPAP \   bipap   Osteoarthritis    Osteopenia    Persistent atrial fibrillation ([facility]    s/p afib ablation - Dr. ARayann Heman1/2020   Personal history of radiation therapy    Pneumonia    PONV (postoperative nausea and vomiting)    Sigmoid diverticulosis    Thalassemia minor    Vitamin D deficiency    Past Surgical History:  Procedure Laterality Date   ATRIAL FIBRILLATION ABLATION N/A 09/21/2018   Procedure: ATRIAL FIBRILLATION ABLATION;  Surgeon: AThompson Grayer MD;  [facility];  Service: Cardiovascular;  Laterality: N/A;   BIOPSY  07/06/2018   Procedure: BIOPSY;  Surgeon: MClarene Essex MD;  [place]  Service: Endoscopy;;   BREAST LUMPECTOMY Left  05/28/2009   BREAST SURGERY Left 10   lumpectomy   CARDIOVERSION N/A 07/07/2018   Procedure: CARDIOVERSION;  Surgeon: RSkeet Latch MD;  [facility];  Service: Cardiovascular;  Laterality: N/A;   CARDIOVERSION N/A 07/14/2018   Procedure: CARDIOVERSION;  Surgeon: MLarey Dresser MD;  [facility];  Service: Cardiovascular;  Laterality: N/A;   CESAREAN SECTION     COLONOSCOPY WITH PROPOFOL N/A 07/06/2018   Procedure: COLONOSCOPY WITH PROPOFOL;  Surgeon: MClarene Essex MD;  [place]  Service: Endoscopy;  Laterality: N/A;   CYSTECTOMY  70   pilonidal    DILATION AND CURETTAGE OF UTERUS     ESOPHAGOGASTRODUODENOSCOPY (EGD) WITH PROPOFOL N/A 07/06/2018   Procedure: ESOPHAGOGASTRODUODENOSCOPY (EGD) WITH PROPOFOL;  Surgeon: MClarene Essex MD;  [place]  Service: Endoscopy;  Laterality: N/A;   EYE SURGERY Bilateral 12/14   cataracts   HOT HEMOSTASIS N/A 07/06/2018   Procedure: HOT HEMOSTASIS (ARGON PLASMA COAGULATION/BICAP);  Surgeon: MClarene Essex MD;  [place]  Service: Endoscopy;  Laterality: N/A;   MAXIMUM ACCESS (MAS)POSTERIOR LUMBAR INTERBODY FUSION (PLIF) 2 LEVEL N/A 09/22/2013   Procedure: FOR MAXIMUM ACCESS  POSTERIOR LUMBAR INTERBODY FUSION LUMBAR THREE-FOUR FOUR-FIVE;  Surgeon: DEustace Moore MD;  [facility];  Service: Neurosurgery;  Laterality: N/A;  OOPHORECTOMY     wedge section not rem   REPLACEMENT TOTAL KNEE Left 11   REPLACEMENT TOTAL KNEE     RIGHT/LEFT HEART CATH AND CORONARY ANGIOGRAPHY N/A 10/26/2020   Procedure: RIGHT/LEFT HEART CATH AND CORONARY ANGIOGRAPHY;  Surgeon: Leonie Man, MD;  Location: [place] [facility];  Service: Cardiovascular;  Laterality: N/A;   TEE WITHOUT CARDIOVERSION N/A 07/07/2018   Procedure: TRANSESOPHAGEAL ECHOCARDIOGRAM (TEE);  Surgeon: [name], MD;  Location: [place];  Service: Cardiovascular;  Laterality: N/A;   TEE WITHOUT CARDIOVERSION N/A 09/21/2018    Procedure: TRANSESOPHAGEAL ECHOCARDIOGRAM (TEE);  Surgeon: Thayer Headings, MD;  [facility];  Service: Cardiovascular;  Laterality: N/A;   TONSILLECTOMY     TUBAL LIGATION      ROS- all systems are reviewed and negatives except as per HPI above  Current Outpatient Medications  Medication Sig Dispense Refill   Calcium Carbonate-Vitamin D (CALCIUM 600 + D PO) Take 1 tablet by mouth daily.      Cholecalciferol (VITAMIN D3) 25 MCG (1000 UT) CAPS Take by mouth.     diltiazem (CARDIZEM CD) 120 MG 24 hr capsule Take 1 capsule (120 mg total) by mouth daily. 90 capsule 1   diphenhydramine-acetaminophen (TYLENOL PM) 25-500 MG TABS tablet Take 2 tablets by mouth as needed (sleep).      glimepiride (AMARYL) 2 MG tablet Take 2 mg by mouth daily with breakfast. Patient takes half tab     metFORMIN (GLUCOPHAGE-XR) 500 MG 24 hr tablet Take 500 mg by mouth 2 (two) times daily.   3   ONETOUCH ULTRA test strip 1 each by Other route daily.     warfarin (COUMADIN) 4 MG tablet TAKE ON MONDAY, WEDNESDAY AND FRIDAY AS DIRECTED BY THE COUMADIN CLINIC 45 tablet 1   warfarin (COUMADIN) 5 MG tablet TAKE ON SUNDAY, TUESDAY, THURSDAY, AND SATURDAY OR TAKE AS DIRECTED BY THE COUMADIN CLINIC 52 tablet 2   zolpidem (AMBIEN) 10 MG tablet Take 1 tablet by mouth as needed.     No current facility-administered medications for this visit.    Physical Exam: Vitals:   01/15/22 1135  BP: 140/62  Pulse: 77  SpO2: 96%  Weight: 253 lb (114.8 kg)  Height: 5' 4.5" (1.638 m)    GEN- The patient is well appearing, alert and oriented x 3 today.   Head- normocephalic, atraumatic Eyes-  Sclera clear, conjunctiva pink Ears- hearing intact Oropharynx- clear Lungs- Clear to ausculation bilaterally, normal work of breathing Heart- Regular rate and rhythm, no murmurs, rubs or gallops, PMI not laterally displaced GI- soft, NT, ND, + BS Extremities- no clubbing, cyanosis, or edema  Wt Readings from Last 3  Encounters:  01/15/22 253 lb (114.8 kg)  09/12/21 246 lb (111.6 kg)  11/15/20 252 lb (114.3 kg)    EKG tracing ordered today is personally reviewed and shows sinus  Assessment and Plan:  Persistent atrial fibrillation Well controlled post ablation off AAD therapy Chads2vasc score is 6.  She is on coumadin.  She is clear that she would like to have her coumadin stopped.  We discussed at length options of watchman, [place], or ILR to monitor for AF recurrence.  She would like to stop coumadin and monitor long term with ILR.  I think that this is a very reasonable option.  If she is found to have increased AF then we will consider other options.  2. HTN Stable No change required today  3. Obesity Body mass index is 42.76  kg/m. Lifestyle modification advised  4. Chronic diastolic dysfunction Stable No change required today  5. OSA Follows with Dr Radford Pax  6. Preop She is considering knee surgery No ischemic symptoms and reasonable active. Ok to proceed without further CV testing at this time.  Risks, benefits and potential toxicities for medications prescribed and/or refilled reviewed with patient today.   Thompson Grayer MD, [facility] 01/15/2022 11:44 AM       PROCEDURES:   1. Implantable loop recorder implantation        DESCRIPTION OF PROCEDURE:  Informed written consent was obtained.  The patient required no sedation for the procedure today.  The patients left chest was prepped and draped. Mapping over the patient's chest was performed to identify the appropriate ILR site.  This area was found to be the left parasternal region over the 3rd-4th intercostal space.  The skin overlying this region was infiltrated with lidocaine for local analgesia.  A 0.5-cm incision was made at the implant site.  A subcutaneous ILR pocket was fashioned using a combination of sharp and blunt dissection.  A Medtronic Reveal Linq model [id number] implantable loop recorder (SN [id number]) was then placed into the  pocket R waves were very prominent and measured > 0.2 mV. EBL<1 ml.  Steri- Strips and a sterile dressing were then applied.  There were no early apparent complications.     CONCLUSIONS:   1. Successful implantation of a Medtronic Reveal LINQ implantable loop recorder for cryptogenic stroke  2. No early apparent complications.   Stop coumadin Return to AF clinic in 6 months  Thompson Grayer MD, [facility] 01/15/2022 12:03 PM

## 2022-01-15 NOTE — Progress Notes (Signed)
See my note from today.

## 2022-01-15 NOTE — Patient Instructions (Signed)
Medication Instructions:  Your physician has recommended you make the following change in your medication:  STOP Coumadin  *If you need a refill on your cardiac medications before your next appointment, please call your pharmacy*   Lab Work: None ordered   Testing/Procedures: None ordered   Follow-Up: At [facility], you and your health needs are our priority.  As part of our continuing mission to provide you with exceptional heart care, we have created designated Provider Care Teams.  These Care Teams include your primary Cardiologist (physician) and Advanced Practice Providers (APPs -  Physician Assistants and Nurse Practitioners) who all work together to provide you with the care you need, when you need it.  Your next appointment:   6 month(s)  The format for your next appointment:   In Person  Provider:   You will follow up in the [facility] located at [facility]. Your provider will be: Roderic Palau, NP or Clint R. Fenton, PA-C    Thank you for choosing [facility]!!   [phone]  Other Instructions   Important Information About Sugar        Implantable Loop Recorder Removal, Care After This sheet gives you information about how to care for yourself after your procedure. Your health care provider may also give you more specific instructions. If you have problems or questions, contact your health care provider. What can I expect after the procedure? After the procedure, it is common to have: Soreness or discomfort near the incision. Some swelling or bruising near the incision.  Follow these instructions at home: Incision care  Monitor your cardiac device site for redness, swelling, and drainage. Call the device clinic at [phone] if you experience these symptoms or fever/chills.  Keep the large square bandage on your site for 24 hours and then you may remove it yourself. Keep the steri-strips underneath in place.    You may shower after 24 hours  with the steri-strips in place. They will usually fall off on their own, or may be removed after 10 days. Pat dry.   Avoid lotions, ointments, or perfumes over your incision until it is well-healed.  Please do not submerge in water until your site is completely healed.   If your wound site starts to bleed apply pressure.      If you have any questions/concerns please call the device clinic at [phone].  Activity  Return to your normal activities.  Contact a health care provider if: You have redness, swelling, or pain around your incision. You have a fever.     Implantable Loop Recorder Placement  An implantable loop recorder is a small electronic device that is placed under the skin of your chest. The device records the electrical activity of your heart over a long period of time. Your health care provider can download these recordings to monitor your heart. You may need an implantable loop recorder if you have periods of abnormal heart activity (arrhythmias) or unexplained fainting (syncope). The recorder can be left in place for 1 year or longer. Tell a health care provider about: Any allergies you have. All medicines you are taking, including vitamins, herbs, eye drops, creams, and over-the-counter medicines. Any problems you or family members have had with anesthetic medicines. Any bleeding problems you have. Any surgeries you have had. Any medical conditions you have. Whether you are pregnant or may be pregnant. What are the risks? Generally, this is a safe procedure. However, problems may occur, including: Infection. Bleeding.  Allergic reactions to anesthetic medicines. Damage to nerves or blood vessels. Failure of the device to work. This could require another surgery to replace it. What happens before the procedure?  You may have a physical exam, blood tests, and imaging tests of your heart, such as a chest X-ray. Follow  instructions from your health care provider about eating or drinking restrictions. Ask your health care provider about: Changing or stopping your regular medicines. This is especially important if you are taking diabetes medicines or blood thinners. Taking medicines such as aspirin and ibuprofen. These medicines can thin your blood. Do not take these medicines unless your health care provider tells you to take them. Taking over-the-counter medicines, vitamins, herbs, and supplements. Ask your health care provider how your surgical site will be marked or identified. Ask your health care provider what steps will be taken to help prevent infection. These may include: Removing hair at the surgery site. Washing skin with a germ-killing soap. Plan to have someone take you home from the hospital or clinic. Plan to have a responsible adult care for you for at least 24 hours after you leave the hospital or clinic. This is important. Do not use any products that contain nicotine or tobacco, such as cigarettes and e-cigarettes. If you need help quitting, ask your health care provider. What happens during the procedure? An IV will be inserted into one of your veins. You may be given one or more of the following: A medicine to help you relax (sedative). A medicine to numb the area (local anesthetic). A small incision will be made on the left side of your upper chest. A pocket will be created under your skin. The device will be placed in the pocket. The incision will be closed with stitches (sutures) or adhesive strips. A bandage (dressing) will be placed over the incision. The procedure may vary among health care providers and hospitals. What happens after the procedure? Your blood pressure, heart rate, breathing rate, and blood oxygen level will be monitored until you leave the hospital or clinic. You may be able to go home on the day of your surgery. Before you go home: Your health care provider will  program your recorder. You will learn how to trigger your device with a handheld activator. You will learn how to send recordings to your health care provider. You will get an ID card for your device, and you will be told when to use it. Do not drive for 24 hours if you were given a sedative during your procedure. Summary An implantable loop recorder is a small electronic device that is placed under the skin of your chest to monitor your heart over a long period of time. The recorder can be left in place for 1 year or longer. Plan to have someone take you home from the hospital or clinic. This information is not intended to replace advice given to you by your health care provider. Make sure you discuss any questions you have with your health care provider. Document Revised: 12/11/2020 Document Reviewed: 12/11/2020 Elsevier Patient Education  [place].

## 2022-01-16 ENCOUNTER — Ambulatory Visit: Payer: Medicare PPO | Admitting: Physical Therapy

## 2022-01-16 ENCOUNTER — Telehealth: Payer: Self-pay

## 2022-01-16 ENCOUNTER — Encounter: Payer: Self-pay | Admitting: Physical Therapy

## 2022-01-16 DIAGNOSIS — R262 Difficulty in walking, not elsewhere classified: Secondary | ICD-10-CM

## 2022-01-16 DIAGNOSIS — M545 Low back pain, unspecified: Secondary | ICD-10-CM | POA: Diagnosis not present

## 2022-01-16 DIAGNOSIS — M62830 Muscle spasm of back: Secondary | ICD-10-CM

## 2022-01-16 DIAGNOSIS — M25561 Pain in right knee: Secondary | ICD-10-CM | POA: Diagnosis not present

## 2022-01-16 NOTE — Telephone Encounter (Signed)
LVM for patient to call device back

## 2022-01-16 NOTE — Telephone Encounter (Signed)
The patient left a voicemail stating she woke up to some blood being outside the bandage. She would like for the nurse to give her a call back.

## 2022-01-16 NOTE — Telephone Encounter (Signed)
Spoke with patient she stated that site was not actively bleeding she stated that the blood was not maroon colored as in old blood advised patient to wait one more day to take outside bandage off due to her being on Coumadin, advised patient that if the site does start bleeding to hold pressure for ten minutes, patient asked about home monitor, had to have [employer] help patient with home monitor.

## 2022-01-16 NOTE — Therapy (Signed)
[place]. [place], [place], [address] Phone: [phone]   Fax:  [phone]  Physical Therapy Treatment  Patient Details  Name: Jenna Mcdonald MRN: [id number] Date of Birth: 07-27-1946 Referring Provider (PT): Eustace Moore   Encounter Date: 01/16/2022   PT End of Session - 01/16/22 0941     Visit Number 68    Date for PT Re-Evaluation 01/31/22    Authorization Type [employer]    Authorization Time Period 4/12    PT Start Time 0927    PT Stop Time 7824    PT Time Calculation (min) 48 min    Activity Tolerance Patient tolerated treatment well    Behavior During Therapy [facility] for tasks assessed/performed             Past Medical History:  Diagnosis Date   Anemia    Aortic stenosis    mild by echo 09/2020 with mean AVG 64mHg   Breast CA ([place]    left   Breast cancer ([place]    Carotid stenosis    1-39% left   Degenerative disc disease, lumbar    of the knees   Diabetes mellitus    Dysrhythmia    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[place]    Fatigue    Severe secondary to to gemfibrozil   Gastroesophageal reflux disease    denies   GI bleeding    a. 06/2018: EGD showed multiple nonbleeding duodenal ulcers. 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   Hammertoe    left second toe   Hypercholesteremia    Hypertension    Insomnia    Irritable bowel    Low back pain    Metabolic syndrome    Morbid obesity (HCC)    OSA on CPAP \   bipap   Osteoarthritis    Osteopenia    Persistent atrial fibrillation ([facility]    s/p afib ablation - Dr. ARayann Heman1/2020   Personal history of radiation therapy    Pneumonia    PONV (postoperative nausea and vomiting)    Sigmoid diverticulosis    Thalassemia minor    Vitamin D deficiency     Past Surgical History:  Procedure Laterality Date   ATRIAL FIBRILLATION ABLATION N/A 09/21/2018   Procedure: ATRIAL  FIBRILLATION ABLATION;  Surgeon: AThompson Grayer MD;  [place];  Service: Cardiovascular;  Laterality: N/A;   BIOPSY  07/06/2018   Procedure: BIOPSY;  Surgeon: MClarene Essex MD;  [facility];  Service: Endoscopy;;   BREAST LUMPECTOMY Left 05/28/2009   BREAST SURGERY Left 2010   lumpectomy   CARDIOVERSION N/A 07/07/2018   Procedure: CARDIOVERSION;  Surgeon: RSkeet Latch MD;  [facility];  Service: Cardiovascular;  Laterality: N/A;   CARDIOVERSION N/A 07/14/2018   Procedure: CARDIOVERSION;  Surgeon: MLarey Dresser MD;  [facility];  Service: Cardiovascular;  Laterality: N/A;   CESAREAN SECTION     COLONOSCOPY WITH PROPOFOL N/A 07/06/2018   Procedure: COLONOSCOPY WITH PROPOFOL;  Surgeon: MClarene Essex MD;  [place]  Service: Endoscopy;  Laterality: N/A;   CYSTECTOMY  1970   pilonidal    DILATION AND CURETTAGE OF UTERUS     ESOPHAGOGASTRODUODENOSCOPY (EGD) WITH PROPOFOL N/A 07/06/2018   Procedure: ESOPHAGOGASTRODUODENOSCOPY (EGD) WITH PROPOFOL;  Surgeon: MClarene Essex MD;  [place]  Service: Endoscopy;  Laterality: N/A;   EYE SURGERY Bilateral 07/2013   cataracts   HOT HEMOSTASIS N/A  07/06/2018   Procedure: HOT HEMOSTASIS (ARGON PLASMA COAGULATION/BICAP);  Surgeon: Clarene Essex, MD;  Location: [place];  Service: Endoscopy;  Laterality: N/A;   implantable loop recorder placement  01/15/2022   Medtronic Reveal Linq model [id number] implantable loop recorder (SN [id number]) implanted for AF management by Dr Rayann Heman   MAXIMUM ACCESS (MAS)POSTERIOR LUMBAR INTERBODY FUSION (PLIF) 2 LEVEL N/A 09/22/2013   Procedure: FOR MAXIMUM ACCESS  POSTERIOR LUMBAR INTERBODY FUSION LUMBAR THREE-FOUR FOUR-FIVE;  Surgeon: Eustace Moore, MD;  Location: [place] NEURO ORS;  Service: Neurosurgery;  Laterality: N/A;   OOPHORECTOMY     wedge section not rem   REPLACEMENT TOTAL KNEE Left 2011   REPLACEMENT TOTAL KNEE     RIGHT/LEFT HEART  CATH AND CORONARY ANGIOGRAPHY N/A 10/26/2020   Procedure: RIGHT/LEFT HEART CATH AND CORONARY ANGIOGRAPHY;  Surgeon: Leonie Man, MD;  Location: [place] CV LAB;  Service: Cardiovascular;  Laterality: N/A;   TEE WITHOUT CARDIOVERSION N/A 07/07/2018   Procedure: TRANSESOPHAGEAL ECHOCARDIOGRAM (TEE);  Surgeon: [name], MD;  Location: [place];  Service: Cardiovascular;  Laterality: N/A;   TEE WITHOUT CARDIOVERSION N/A 09/21/2018   Procedure: TRANSESOPHAGEAL ECHOCARDIOGRAM (TEE);  Surgeon: Acie Fredrickson Wonda Cheng, MD;  Location: [facility]OSCOPY;  Service: Cardiovascular;  Laterality: N/A;   TONSILLECTOMY     TUBAL LIGATION      There were no vitals filed for this visit.   Subjective Assessment - 01/16/22 0942     Subjective I had a loop recorder inserted yesterday, I can't do much wiht my arms and I have a little bleeding at the site    Currently in Pain? Yes    Pain Score 4     Pain Location Back    Pain Relieving Factors this is really helps                               [facility] Adult PT Treatment/Exercise - 01/16/22 0001       Lumbar Exercises: Aerobic   Nustep level 4 x 5 minutes LE's only      Lumbar Exercises: Seated   Long Arc Quad on Chair Strengthening;Both;2 sets;10 reps;Weights    LAQ on Chair Weights (lbs) 3    Other Seated Lumbar Exercises hip flex and abd 2 sets 10 3#    Other Seated Lumbar Exercises green HS curl 2 sets 10      Lumbar Exercises: Supine   Bridge 15 reps    Other Supine Lumbar Exercises knee to chest trunk rotation      Traction   Type of Traction Lumbar    Max (lbs) 50    Time 15                       PT Short Term Goals - 11/13/21 7262       PT SHORT TERM GOAL #1   Title independent with initial HEP    Status Achieved               PT Long Term Goals - 01/16/22 0953       PT LONG TERM GOAL #1   Title report pain decreased 25%    Status Partially Met      PT LONG TERM GOAL #2    Title increase lumbar ROM 25%    Status Partially Met      PT LONG TERM GOAL #3   Title report tolerance to standing at  10 minutes to cook a meal    Status Partially Met                   Plan - 01/16/22 0953     Clinical Impression Statement Patient reports less back pain, she did have a loop recorder placed in her chest yesterday, she does have some bleeding, I elected to not have her do any arm motions today to avoid stress on this area, she does feel better back pain wise.  I started asking about her after PT plans, she thinks the HEP will be best    PT Next Visit Plan will work on the strength and function    Consulted and Agree with Plan of Care Patient             Patient will benefit from skilled therapeutic intervention in order to improve the following deficits and impairments:  Abnormal gait, Decreased range of motion, Difficulty walking, Decreased mobility, Decreased strength, Postural dysfunction, Improper body mechanics, Impaired flexibility, Decreased balance, Pain, Decreased activity tolerance, Increased muscle spasms  Visit Diagnosis: Acute bilateral low back pain without sciatica  Difficulty in walking, not elsewhere classified  Acute pain of right knee  Muscle spasm of back     Problem List Patient Active Problem List   Diagnosis Date Noted   Shortness of breath    Unstable angina ([place]) 10/23/2020   Secondary hypercoagulable state ([place]) 10/15/2020   Encounter for therapeutic drug monitoring 08/17/2018   CKD (chronic kidney disease) stage 3, GFR 30-59 ml/min (HCC) 07/21/2018   Chronic anemia 07/21/2018   Anemia    Heme positive stool    Atrial fibrillation with RVR ([place]) 07/04/2018   Carotid stenosis    Bruit of left carotid artery 09/22/2017   Aortic stenosis    Heart murmur 09/09/2016   Morbid obesity ([place]) 02/05/2016   Dry eye 05/17/2015   Type 2 diabetes mellitus without complication ([place]) 74/14/2395   Breast cancer, left breast  ([place]) 07/22/2014   Obesity, Class III, BMI 40-49.9 (morbid obesity) ([place]) 07/06/2014   Macular pseudohole 05/17/2014   History of surgical procedure 05/17/2014   OSA (obstructive sleep apnea) 11/17/2013   S/P lumbar spinal fusion 09/22/2013   Persistent atrial fibrillation ([place]) 09/11/2011    Class: Acute   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[place]) 09/11/2011    Class: Chronic   Essential hypertension 09/11/2011   Exogenous obesity 09/11/2011   Gastroesophageal reflux disease 09/11/2011   Thalassemia minor 09/11/2011    Class: Chronic   Breast cancer ([place]) 09/11/2011    Class: Chronic   Degenerative joint disease 09/11/2011    Class: Chronic   Irritable bowel disease 09/11/2011    Class: Chronic   Lumbar disc disease 09/11/2011    Sumner Boast, PT 01/16/2022, 10:01 AM  [place]. [place], [place], [address] Phone: [phone]   Fax:  [phone]  Name: Jenna Mcdonald MRN: [id number] Date of Birth: [birth year]

## 2022-01-21 ENCOUNTER — Encounter: Payer: Self-pay | Admitting: Physical Therapy

## 2022-01-21 ENCOUNTER — Ambulatory Visit: Payer: Medicare PPO | Admitting: Physical Therapy

## 2022-01-21 DIAGNOSIS — M545 Low back pain, unspecified: Secondary | ICD-10-CM | POA: Diagnosis not present

## 2022-01-21 DIAGNOSIS — M62830 Muscle spasm of back: Secondary | ICD-10-CM

## 2022-01-21 DIAGNOSIS — R262 Difficulty in walking, not elsewhere classified: Secondary | ICD-10-CM

## 2022-01-21 DIAGNOSIS — M25561 Pain in right knee: Secondary | ICD-10-CM

## 2022-01-21 NOTE — Therapy (Signed)
[place]. [place], [place], [address] Phone: [phone]   Fax:  [phone]  Physical Therapy Treatment  Patient Details  Name: Jenna Mcdonald General MRN: [id number] Date of Birth: Mar 21, 1946 Referring Provider (PT): Eustace Moore   Encounter Date: 01/21/2022   PT End of Session - 01/21/22 0925     Visit Number 18    Date for PT Re-Evaluation 01/31/22    Authorization Type [employer]    Authorization Time Period 5/12    PT Start Time 0920    PT Stop Time 1010    PT Time Calculation (min) 50 min    Activity Tolerance Patient tolerated treatment well    Behavior During Therapy [facility] for tasks assessed/performed             Past Medical History:  Diagnosis Date   Anemia    Aortic stenosis    mild by echo 09/2020 with mean AVG 18mHg   [place]ast CA [place]    left   [place]    Carotid stenosis    1-39% left   Degenerative disc disease, lumbar    of the knees   Diabetes mellitus    Dysrhythmia    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[place]    Fatigue    Severe secondary to to gemfibrozil   Gastroesophageal reflux disease    denies   GI bleeding    a. 06/2018: EGD showed multiple nonbleeding duodenal ulcers. 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   Hammertoe    left second toe   Hypercholesteremia    Hypertension    Insomnia    Irritable bowel    Low back pain    Metabolic syndrome    Morbid obesity (HCC)    OSA on CPAP _0   bipap   Osteoarthritis    Osteopenia    Persistent atrial fibrillation ([facility]    s/p afib ablation - Dr. ARayann Heman1/2020   Personal history of radiation therapy    Pneumonia    PONV (postoperative nausea and vomiting)    Sigmoid diverticulosis    Thalassemia minor    Vitamin D deficiency     Past Surgical History:  Procedure Laterality Date   ATRIAL FIBRILLATION ABLATION N/A 09/21/2018   Procedure: ATRIAL  FIBRILLATION ABLATION;  Surgeon: AThompson Grayer MD;  [place];  Service: Cardiovascular;  Laterality: N/A;   BIOPSY  07/06/2018   Procedure: BIOPSY;  Surgeon: MClarene Essex MD;  [facility];  Service: Endoscopy;;   BREAST LUMPECTOMY Left 05/28/2009   BREAST SURGERY Left 2010   lumpectomy   CARDIOVERSION N/A 07/07/2018   Procedure: CARDIOVERSION;  Surgeon: RSkeet Latch MD;  [facility];  Service: Cardiovascular;  Laterality: N/A;   CARDIOVERSION N/A 07/14/2018   Procedure: CARDIOVERSION;  Surgeon: MLarey Dresser MD;  [facility];  Service: Cardiovascular;  Laterality: N/A;   CESAREAN SECTION     COLONOSCOPY WITH PROPOFOL N/A 07/06/2018   Procedure: COLONOSCOPY WITH PROPOFOL;  Surgeon: MClarene Essex MD;  [place]  Service: Endoscopy;  Laterality: N/A;   CYSTECTOMY  1970   pilonidal    DILATION AND CURETTAGE OF UTERUS     ESOPHAGOGASTRODUODENOSCOPY (EGD) WITH PROPOFOL N/A 07/06/2018   Procedure: ESOPHAGOGASTRODUODENOSCOPY (EGD) WITH PROPOFOL;  Surgeon: MClarene Essex MD;  [place]  Service: Endoscopy;  Laterality: N/A;   EYE SURGERY Bilateral 07/2013   cataracts   HOT HEMOSTASIS N/A  07/06/2018   Procedure: HOT HEMOSTASIS (ARGON PLASMA COAGULATION/BICAP);  Surgeon: Clarene Essex, MD;  Location: [place];  Service: Endoscopy;  Laterality: N/A;   implantable loop recorder placement  01/15/2022   Medtronic Reveal Linq model [id number] implantable loop recorder (SN [id number]) implanted for AF management by Dr Rayann Heman   MAXIMUM ACCESS (MAS)POSTERIOR LUMBAR INTERBODY FUSION (PLIF) 2 LEVEL N/A 09/22/2013   Procedure: FOR MAXIMUM ACCESS  POSTERIOR LUMBAR INTERBODY FUSION LUMBAR THREE-FOUR FOUR-FIVE;  Surgeon: Eustace Moore, MD;  Location: [place] NEURO ORS;  Service: Neurosurgery;  Laterality: N/A;   OOPHORECTOMY     wedge section not rem   REPLACEMENT TOTAL KNEE Left 2011   REPLACEMENT TOTAL KNEE     RIGHT/LEFT HEART  CATH AND CORONARY ANGIOGRAPHY N/A 10/26/2020   Procedure: RIGHT/LEFT HEART CATH AND CORONARY ANGIOGRAPHY;  Surgeon: Leonie Man, MD;  Location: [place] CV LAB;  Service: Cardiovascular;  Laterality: N/A;   TEE WITHOUT CARDIOVERSION N/A 07/07/2018   Procedure: TRANSESOPHAGEAL ECHOCARDIOGRAM (TEE);  Surgeon: [name], MD;  Location: [place];  Service: Cardiovascular;  Laterality: N/A;   TEE WITHOUT CARDIOVERSION N/A 09/21/2018   Procedure: TRANSESOPHAGEAL ECHOCARDIOGRAM (TEE);  Surgeon: Acie Fredrickson Wonda Cheng, MD;  Location[facility] END[facility]COPY;  Service: Cardiovascular;  Laterality: N/A;   TONSILLECTOMY     TUBAL LIGATION      There were no vitals filed for this visit.   Subjective Assessment - 01/21/22 0926     Subjective I am doing okay, less bleeding from the loop insertion, my knee still hurts after the injections    Currently in Pain? Yes    Pain Score 5     Pain Location Back    Pain Orientation Lower    Aggravating Factors  walking, standing                               [facility] Adult PT Treatment/Exercise - 01/21/22 0001       Lumbar Exercises: Stretches   Passive Hamstring Stretch Right;Left;3 reps;20 seconds    Piriformis Stretch Right;Left;4 reps;20 seconds      Lumbar Exercises: Aerobic   Nustep level 4 x 6 minutes      Lumbar Exercises: Machines for Strengthening   Cybex Lumbar Extension black tband flex and ext 20 x    Cybex Knee Flexion 20# 2x15    Other Lumbar Machine Exercise lat pull and row 20# 2 sets 10,    Other Lumbar Machine Exercise 5# straight arm pulls cues for posture and core activation      Traction   Type of Traction Lumbar    Max (lbs) 50    Time 15                       PT Short Term Goals - 11/13/21 6063       PT SHORT TERM GOAL #1   Title independent with initial HEP    Status Achieved               PT Long Term Goals - 01/21/22 0160       PT LONG TERM GOAL #1   Title report pain  decreased 25%    Status Partially Met      PT LONG TERM GOAL #2   Title increase lumbar ROM 25%    Status Partially Met      PT LONG TERM GOAL #3   Title report tolerance to  standing at 10 minutes to cook a meal    Status Partially Met                   Plan - 01/21/22 1001     Clinical Impression Statement Patient reports that she gets so stiff over the weekend, she reports that she always feels better when she leaves here.  She suffers with knee pain with walking, standing and exercises.  She reports that she will be seeing a knee surgeon as she has not had much relief with the injections    PT Next Visit Plan continue with strength and flexibility             Patient will benefit from skilled therapeutic intervention in order to improve the following deficits and impairments:  Abnormal gait, Decreased range of motion, Difficulty walking, Decreased mobility, Decreased strength, Postural dysfunction, Improper body mechanics, Impaired flexibility, Decreased balance, Pain, Decreased activity tolerance, Increased muscle spasms  Visit Diagnosis: Acute bilateral low back pain without sciatica  Difficulty in walking, not elsewhere classified  Acute pain of right knee  Muscle spasm of back     Problem List Patient Active Problem List   Diagnosis Date Noted   Shortness of breath    Unstable angina ([place]) 10/23/2020   Secondary hypercoagulable state ([place]) 10/15/2020   Encounter for therapeutic drug monitoring 08/17/2018   CKD (chronic kidney disease) stage 3, GFR 30-59 ml/min (HCC) 07/21/2018   Chronic anemia 07/21/2018   Anemia    Heme positive stool    Atrial fibrillation with RVR ([place]) 07/04/2018   Carotid stenosis    Bruit of left carotid artery 09/22/2017   Aortic stenosis    Heart murmur 09/09/2016   Morbid obesity ([place]) 02/05/2016   Dry eye 05/17/2015   Type 2 diabetes mellitus without complication ([place]) 76/19/5093   Breast cancer, left breast ([place])  07/22/2014   Obesity, Class III, BMI 40-49.9 (morbid obesity) ([place]) 07/06/2014   Macular pseudohole 05/17/2014   History of surgical procedure 05/17/2014   OSA (obstructive sleep apnea) 11/17/2013   S/P lumbar spinal fusion 09/22/2013   Persistent atrial fibrillation ([place]) 09/11/2011    Class: Acute   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[place]) 09/11/2011    Class: Chronic   Essential hypertension 09/11/2011   Exogenous obesity 09/11/2011   Gastroesophageal reflux disease 09/11/2011   Thalassemia minor 09/11/2011    Class: Chronic   Breast cancer ([place]) 09/11/2011    Class: Chronic   Degenerative joint disease 09/11/2011    Class: Chronic   Irritable bowel disease 09/11/2011    Class: Chronic   Lumbar disc disease 09/11/2011    Sumner Boast, PT 01/21/2022, 10:03 AM  [place]. [place], [place], [address] Phone: [phone]   Fax:  [phone]  Name: Madilynne Mullan MRN: [id number] Date of Birth: 02/12/1946

## 2022-01-23 ENCOUNTER — Ambulatory Visit: Payer: Medicare PPO | Attending: Neurological Surgery | Admitting: Physical Therapy

## 2022-01-23 DIAGNOSIS — R262 Difficulty in walking, not elsewhere classified: Secondary | ICD-10-CM | POA: Diagnosis not present

## 2022-01-23 DIAGNOSIS — M25561 Pain in right knee: Secondary | ICD-10-CM | POA: Diagnosis not present

## 2022-01-23 DIAGNOSIS — M62830 Muscle spasm of back: Secondary | ICD-10-CM | POA: Insufficient documentation

## 2022-01-23 DIAGNOSIS — M545 Low back pain, unspecified: Secondary | ICD-10-CM | POA: Diagnosis not present

## 2022-01-23 NOTE — Therapy (Signed)
Jenna Mcdonald. [place], [place], [address] Phone: [phone]   Fax:  [phone]  Physical Therapy Treatment  Patient Details  Name: Jenna Mcdonald MRN: [id number] Date of Birth: [birth year] Referring Provider (PT): Eustace Moore   Encounter Date: Jenna Mcdonald   PT End of Session - 01/23/22 1002     Visit Number 19    Date for PT Re-Evaluation 01/31/22    Authorization Type [employer]    PT Start Time 0930    PT Stop Time 8333    PT Time Calculation (min) 45 min             Past Medical History:  Diagnosis Date   Anemia    Aortic stenosis    mild by echo 09/2020 with mean AVG 47mHg   Breast CA ([facility]    left   Breast cancer ([facility] [place]    Carotid stenosis    1-39% left   Degenerative disc disease, lumbar    of the knees   Diabetes mellitus    Dysrhythmia    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[place]    Fatigue    Severe secondary to to gemfibrozil   Gastroesophageal reflux disease    denies   GI bleeding    a. 06/2018: EGD showed multiple nonbleeding duodenal ulcers. 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   Hammertoe    left second toe   Hypercholesteremia    Hypertension    Insomnia    Irritable bowel    Low back pain    Metabolic syndrome    Morbid obesity (HCC)    OSA on CPAP \   bipap   Osteoarthritis    Osteopenia    Persistent atrial fibrillation ([facility]    s/p afib ablation - Dr. ARayann Heman1/2020   Personal history of radiation therapy    Pneumonia    PONV (postoperative nausea and vomiting)    Sigmoid diverticulosis    Thalassemia minor    Vitamin D deficiency     Past Surgical History:  Procedure Laterality Date   ATRIAL FIBRILLATION ABLATION N/A 09/21/2018   Procedure: ATRIAL FIBRILLATION ABLATION;  Surgeon: AThompson Grayer MD;  [facility];  Service: Cardiovascular;  Laterality: N/A;   BIOPSY  07/06/2018    Procedure: BIOPSY;  Surgeon: MClarene Essex MD;  [facility];  Service: Endoscopy;;   BREAST LUMPECTOMY Left 05/28/2009   BREAST SURGERY Left 2010   lumpectomy   CARDIOVERSION N/A 07/07/2018   Procedure: CARDIOVERSION;  Surgeon: RSkeet Latch MD;  [facility];  Service: Cardiovascular;  Laterality: N/A;   CARDIOVERSION N/A 07/14/2018   Procedure: CARDIOVERSION;  Surgeon: MLarey Dresser MD;  [facility];  Service: Cardiovascular;  Laterality: N/A;   CESAREAN SECTION     COLONOSCOPY WITH PROPOFOL N/A 07/06/2018   Procedure: COLONOSCOPY WITH PROPOFOL;  Surgeon: MClarene Essex MD;  [place]  Service: Endoscopy;  Laterality: N/A;   CYSTECTOMY  1970   pilonidal    DILATION AND CURETTAGE OF UTERUS     ESOPHAGOGASTRODUODENOSCOPY (EGD) WITH PROPOFOL N/A 07/06/2018   Procedure: ESOPHAGOGASTRODUODENOSCOPY (EGD) WITH PROPOFOL;  Surgeon: MClarene Essex MD;  [facility]  Service: Endoscopy;  Laterality: N/A;   EYE SURGERY Bilateral 07/2013   cataracts   HOT HEMOSTASIS N/A 07/06/2018   Procedure: HOT HEMOSTASIS (ARGON PLASMA COAGULATION/BICAP);  Surgeon: MClarene Essex MD;  [place] [facility]  Service: Endoscopy;  Laterality: N/A;  implantable loop recorder placement  01/15/2022   Medtronic Reveal Linq model [id number] implantable loop recorder (SN [id number]) implanted for AF management by Dr Rayann Heman   MAXIMUM ACCESS (MAS)POSTERIOR LUMBAR INTERBODY FUSION (PLIF) 2 LEVEL N/A 09/22/2013   Procedure: FOR MAXIMUM ACCESS  POSTERIOR LUMBAR INTERBODY FUSION LUMBAR THREE-FOUR FOUR-FIVE;  Surgeon: Eustace Moore, MD;  Location: [place] [facility]URO ORS;  Service: Neurosurgery;  Laterality: N/A;   OOPHORECTOMY     wedge section not rem   REPLACEMENT TOTAL KNEE Left 2011   REPLACEMENT TOTAL KNEE     RIGHT/LEFT HEART CATH AND CORONARY ANGIOGRAPHY N/A 10/26/2020   Procedure: RIGHT/LEFT HEART CATH AND CORONARY ANGIOGRAPHY;  Surgeon: Leonie Man, MD;  Location: [place] CV LAB;  Service: Cardiovascular;  Laterality: N/A;   TEE WITHOUT CARDIOVERSION N/A 07/07/2018   Procedure: TRANSESOPHAGEAL ECHOCARDIOGRAM (TEE);  Surgeon: [name], MD;  Location: [place];  Service: Cardiovascular;  Laterality: N/A;   TEE WITHOUT CARDIOVERSION N/A 09/21/2018   Procedure: TRANSESOPHAGEAL ECHOCARDIOGRAM (TEE);  Surgeon: Acie Fredrickson Wonda Cheng, MD;  [facility];  Service: Cardiovascular;  Laterality: N/A;   TONSILLECTOMY     TUBAL LIGATION      There were no vitals filed for this visit.   Subjective Assessment - 01/23/22 0934     Subjective alittel sore after last session but better yesterday and today    Currently in Pain? Yes    Pain Score 5     Pain Location Back                               [facility] Adult PT Treatment/Exercise - 01/23/22 0001       Lumbar Exercises: Aerobic   Nustep level 5 x 6 minutes      Lumbar Exercises: Machines for Strengthening   Cybex Lumbar Extension black tband flex and ext 20 x    Other Lumbar Machine Exercise lat pull and row 20# 2 sets 10,      Lumbar Exercises: Seated   Other Seated Lumbar Exercises wt ball chest press 10 x the rotation 10 each      Lumbar Exercises: Supine   Ab Set 15 reps;3 seconds    Bent Knee Raise 20 reps   green tband   Bridge 15 reps;Non-compliant   then obl 10 each side   Bridge with [employer] 15 reps;Compliant    Bridge with clamshell 15 reps;Compliant   green tband     Moist Heat Therapy   Number Minutes Moist Heat 15 Minutes    Moist Heat Location Lumbar Spine      Traction   Type of Traction Lumbar    Max (lbs) 50    Time 15      Manual Therapy   Manual Therapy Passive ROM    Passive ROM LE and trunk                       PT Short Term Goals - 11/13/21 0937       PT SHORT TERM GOAL #1   Title independent with initial HEP    Status Achieved               PT Long Term Goals - 01/21/22 2878       PT LONG TERM  GOAL #1   Title report pain decreased 25%    Status Partially Met      PT LONG TERM  GOAL #2   Title increase lumbar ROM 25%    Status Partially Met      PT LONG TERM GOAL #3   Title report tolerance to standing at 10 minutes to cook a meal    Status Partially Met                   Plan - 01/23/22 1002     Clinical Impression Statement pt tolerated ther ex well and improved PROM/stretching tolerance. noted increased flexibility since last time Jenna Mcdonald worked with pt. pt contiues to get relief with tarction.    PT Treatment/Interventions ADLs/Self Care Home Management;Cryotherapy;Electrical Stimulation;Moist Heat;Gait training;Stair training;Functional mobility training;Iontophoresis 42m/ml Dexamethasone;Therapeutic activities;Therapeutic exercise;Balance training;Neuromuscular re-education;Manual techniques;Patient/family education;Dry needling    PT Next Visit Plan continue with strength and flexibility             Patient will benefit from skilled therapeutic intervention in order to improve the following deficits and impairments:  Abnormal gait, Decreased range of motion, Difficulty walking, Decreased mobility, Decreased strength, Postural dysfunction, Improper body mechanics, Impaired flexibility, Decreased balance, Pain, Decreased activity tolerance, Increased muscle spasms  Visit Diagnosis: Acute bilateral low back pain without sciatica  Difficulty in walking, not elsewhere classified  Acute pain of right knee  Muscle spasm of back     Problem List Patient Active Problem List   Diagnosis Date Noted   Shortness of breath    Unstable angina ([facility] 10/23/2020   Secondary hypercoagulable state ([facility] 10/15/2020   Encounter for therapeutic drug monitoring 08/17/2018   CKD (chronic kidney disease) stage 3, GFR 30-59 ml/min (HCC) 07/21/2018   Chronic anemia 07/21/2018   Anemia    Heme positive stool    Atrial fibrillation with RVR ([facility] 07/04/2018   Carotid  stenosis    Bruit of left carotid artery 09/22/2017   Aortic stenosis    Heart murmur 09/09/2016   Morbid obesity ([facility] 02/05/2016   Dry eye 05/17/2015   Type 2 diabetes mellitus without complication ([facility] 183/66/2947  Breast cancer, left breast ([facility] 07/22/2014   Obesity, Class III, BMI 40-49.9 (morbid obesity) ([facility] 07/06/2014   Macular pseudohole 05/17/2014   History of surgical procedure 05/17/2014   OSA (obstructive sleep apnea) 11/17/2013   S/P lumbar spinal fusion 09/22/2013   Persistent atrial fibrillation ([facility] 09/11/2011    Class: Acute   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[place] 09/11/2011    Class: Chronic   Essential hypertension 09/11/2011   Exogenous obesity 09/11/2011   Gastroesophageal reflux disease 09/11/2011   Thalassemia minor 09/11/2011    Class: Chronic   Breast cancer ([facility] 09/11/2011    Class: Chronic   Degenerative joint disease 09/11/2011    Class: Chronic   Irritable bowel disease 09/11/2011    Class: Chronic   Lumbar disc disease 09/11/2011    Jenna Mcdonald,Jenna Mcdonald, Jenna Mcdonald Jenna Mcdonald, Jenna Mcdonald  [place] [address]Phone: [phone]  Fax:  [phone] Name: Jenna OstromMRN: [id number] of Birth: Mcdonald

## 2022-01-28 ENCOUNTER — Ambulatory Visit: Payer: Medicare PPO | Admitting: Physical Therapy

## 2022-01-28 ENCOUNTER — Encounter: Payer: Self-pay | Admitting: Physical Therapy

## 2022-01-28 DIAGNOSIS — M25561 Pain in right knee: Secondary | ICD-10-CM | POA: Diagnosis not present

## 2022-01-28 DIAGNOSIS — M62830 Muscle spasm of back: Secondary | ICD-10-CM

## 2022-01-28 DIAGNOSIS — M545 Low back pain, unspecified: Secondary | ICD-10-CM | POA: Diagnosis not present

## 2022-01-28 DIAGNOSIS — R262 Difficulty in walking, not elsewhere classified: Secondary | ICD-10-CM | POA: Diagnosis not present

## 2022-01-28 NOTE — Therapy (Signed)
[place]. [place], [place], [address] Phone: [phone]   Fax:  [phone] Progress Note Reporting Period 10/24/21 to 01/28/22  See note below for Objective Data and Assessment of Progress/Goals.     Physical Therapy Treatment  Patient Details  Name: Jenna Mcdonald MRN: [id number] Date of Birth: 1945/10/04 Referring Provider (PT): Eustace Moore   Encounter Date: 01/28/2022   PT End of Session - 01/28/22 0922     Visit Number 20    Date for PT Re-Evaluation 01/31/22    Authorization Type [employer]    Authorization Time Period 7/12    PT Start Time 0922    PT Stop Time 1013    PT Time Calculation (min) 51 min    Activity Tolerance Patient tolerated treatment well    Behavior During Therapy [facility] for tasks assessed/performed             Past Medical History:  Diagnosis Date   Anemia    Aortic stenosis    mild by echo 09/2020 with mean AVG 10mHg   Breast CA ([place]    left   Breast cancer ([facility]    Carotid stenosis    1-39% left   Degenerative disc disease, lumbar    of the knees   Diabetes mellitus    Dysrhythmia    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[facility]    Fatigue    Severe secondary to to gemfibrozil   Gastroesophageal reflux disease    denies   GI bleeding    a. 06/2018: EGD showed multiple nonbleeding duodenal ulcers. 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   Hammertoe    left second toe   Hypercholesteremia    Hypertension    Insomnia    Irritable bowel    Low back pain    Metabolic syndrome    Morbid obesity (HCC)    OSA on CPAP \   bipap   Osteoarthritis    Osteopenia    Persistent atrial fibrillation ([facility]    s/p afib ablation - Dr. ARayann Heman1/2020   Personal history of radiation therapy    Pneumonia    PONV (postoperative nausea and vomiting)    Sigmoid diverticulosis    Thalassemia minor    Vitamin D deficiency      Past Surgical History:  Procedure Laterality Date   ATRIAL FIBRILLATION ABLATION N/A 09/21/2018   Procedure: ATRIAL FIBRILLATION ABLATION;  Surgeon: AThompson Grayer MD;  [facility];  Service: Cardiovascular;  Laterality: N/A;   BIOPSY  07/06/2018   Procedure: BIOPSY;  Surgeon: MClarene Essex MD;  [facility];  Service: Endoscopy;;   BREAST LUMPECTOMY Left 05/28/2009   BREAST SURGERY Left 2010   lumpectomy   CARDIOVERSION N/A 07/07/2018   Procedure: CARDIOVERSION;  Surgeon: RSkeet Latch MD;  [facility];  Service: Cardiovascular;  Laterality: N/A;   CARDIOVERSION N/A 07/14/2018   Procedure: CARDIOVERSION;  Surgeon: MLarey Dresser MD;  [facility];  Service: Cardiovascular;  Laterality: N/A;   CESAREAN SECTION     COLONOSCOPY WITH PROPOFOL N/A 07/06/2018   Procedure: COLONOSCOPY WITH PROPOFOL;  Surgeon: MClarene Essex MD;  Location: [place]  Service: Endoscopy;  Laterality: N/A;   CYSTECTOMY  1970   pilonidal    DILATION AND CURETTAGE OF UTERUS     ESOPHAGOGASTRODUODENOSCOPY (EGD) WITH PROPOFOL N/A 07/06/2018   Procedure: ESOPHAGOGASTRODUODENOSCOPY (EGD) WITH PROPOFOL;  Surgeon: MClarene Essex MD;  [facility]  Service: Endoscopy;  Laterality: N/A;   EYE SURGERY Bilateral 07/2013   cataracts   HOT HEMOSTASIS N/A 07/06/2018   Procedure: HOT HEMOSTASIS (ARGON PLASMA COAGULATION/BICAP);  Surgeon: Clarene Essex, MD;  Location: [place];  Service: Endoscopy;  Laterality: N/A;   implantable loop recorder placement  01/15/2022   Medtronic Reveal Linq model [id number] implantable loop recorder (SN [id number]) implanted for AF management by Dr Rayann Heman   MAXIMUM ACCESS (MAS)POSTERIOR LUMBAR INTERBODY FUSION (PLIF) 2 LEVEL N/A 09/22/2013   Procedure: FOR MAXIMUM ACCESS  POSTERIOR LUMBAR INTERBODY FUSION LUMBAR THREE-FOUR FOUR-FIVE;  Surgeon: Eustace Moore, MD;  Location: [place] NEURO ORS;  Service: Neurosurgery;  Laterality: N/A;    OOPHORECTOMY     wedge section not rem   REPLACEMENT TOTAL KNEE Left 2011   REPLACEMENT TOTAL KNEE     RIGHT/LEFT HEART CATH AND CORONARY ANGIOGRAPHY N/A 10/26/2020   Procedure: RIGHT/LEFT HEART CATH AND CORONARY ANGIOGRAPHY;  Surgeon: Leonie Man, MD;  Location: [place] CV LAB;  Service: Cardiovascular;  Laterality: N/A;   TEE WITHOUT CARDIOVERSION N/A 07/07/2018   Procedure: TRANSESOPHAGEAL ECHOCARDIOGRAM (TEE);  Surgeon: [name], MD;  Location: [place];  Service: Cardiovascular;  Laterality: N/A;   TEE WITHOUT CARDIOVERSION N/A 09/21/2018   Procedure: TRANSESOPHAGEAL ECHOCARDIOGRAM (TEE);  Surgeon: Acie Fredrickson Wonda Cheng, MD;  [facility];  Service: Cardiovascular;  Laterality: N/A;   TONSILLECTOMY     TUBAL LIGATION      There were no vitals filed for this visit.   Subjective Assessment - 01/28/22 0922     Subjective Feeling a little better in the back, knees still hurt.  I think the stretching really helps    Currently in Pain? Yes    Pain Score 3     Pain Location Back    Pain Orientation Lower    Pain Descriptors / Indicators Sore    Pain Relieving Factors stretching                               [facility] Adult PT Treatment/Exercise - 01/28/22 0001       Lumbar Exercises: Stretches   Passive Hamstring Stretch Right;Left;3 reps;20 seconds    Lower Trunk Rotation 4 reps;20 seconds    Piriformis Stretch Right;Left;4 reps;20 seconds      Lumbar Exercises: Aerobic   Tread Mill .5 mph x 2 minutes with one rest break    UBE (Upper Arm Bike) level 1 x 3 minutes    Recumbent Bike 1 minute, difficult due to knee ROM    Nustep level 5 x 6 minutes      Lumbar Exercises: Machines for Strengthening   Cybex Lumbar Extension black tband flex and ext 20 x    Other Lumbar Machine Exercise lat pull and row 20# 2 sets 10,    Other Lumbar Machine Exercise 5# straight arm pulls cues for posture and core activation      Traction   Type of  Traction Lumbar    Max (lbs) 55    Hold Time static    Time 15                     PT Education - 01/28/22 1006     Education Details started talking and educating patient on options for after PT with HEP, vs our gym, vs pool, vs Y    Person(s) Educated Patient    Methods Explanation  Comprehension Verbalized understanding              PT Short Term Goals - 11/13/21 3086       PT SHORT TERM GOAL #1   Title independent with initial HEP    Status Achieved               PT Long Term Goals - 01/21/22 5784       PT LONG TERM GOAL #1   Title report pain decreased 25%    Status Partially Met      PT LONG TERM GOAL #2   Title increase lumbar ROM 25%    Status Partially Met      PT LONG TERM GOAL #3   Title report tolerance to standing at 10 minutes to cook a meal    Status Partially Met                   Plan - 01/28/22 1006     Clinical Impression Statement Started talking about what she can do after PT and gave her options and started more aerobic type activities.  She struggled on the bike and the treadmill but was not in incresaed pain.    PT Next Visit Plan continue to talk with her about options after PT    Consulted and Agree with Plan of Care Patient             Patient will benefit from skilled therapeutic intervention in order to improve the following deficits and impairments:  Abnormal gait, Decreased range of motion, Difficulty walking, Decreased mobility, Decreased strength, Postural dysfunction, Improper body mechanics, Impaired flexibility, Decreased balance, Pain, Decreased activity tolerance, Increased muscle spasms  Visit Diagnosis: Acute bilateral low back pain without sciatica  Difficulty in walking, not elsewhere classified  Acute pain of right knee  Muscle spasm of back     Problem List Patient Active Problem List   Diagnosis Date Noted   Shortness of breath    Unstable angina ([place]) 10/23/2020    Secondary hypercoagulable state ([place]) 10/15/2020   Encounter for therapeutic drug monitoring 08/17/2018   CKD (chronic kidney disease) stage 3, GFR 30-59 ml/min (HCC) 07/21/2018   Chronic anemia 07/21/2018   Anemia    Heme positive stool    Atrial fibrillation with RVR ([place]) 07/04/2018   Carotid stenosis    Bruit of left carotid artery 09/22/2017   Aortic stenosis    Heart murmur 09/09/2016   Morbid obesity ([place]) 02/05/2016   Dry eye 05/17/2015   Type 2 diabetes mellitus without complication ([place]) 69/62/9528   Breast cancer, left breast ([place]) 07/22/2014   Obesity, Class III, BMI 40-49.9 (morbid obesity) ([place]) 07/06/2014   Macular pseudohole 05/17/2014   History of surgical procedure 05/17/2014   OSA (obstructive sleep apnea) 11/17/2013   S/P lumbar spinal fusion 09/22/2013   Persistent atrial fibrillation ([place]) 09/11/2011    Class: Acute   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[place]) 09/11/2011    Class: Chronic   Essential hypertension 09/11/2011   Exogenous obesity 09/11/2011   Gastroesophageal reflux disease 09/11/2011   Thalassemia minor 09/11/2011    Class: Chronic   Breast cancer ([place]) 09/11/2011    Class: Chronic   Degenerative joint disease 09/11/2011    Class: Chronic   Irritable bowel disease 09/11/2011    Class: Chronic   Lumbar disc disease 09/11/2011    Sumner Boast, PT 01/28/2022, 10:08 AM  [place]. [place], [place],  [address] Phone: [phone]   Fax:  [phone]  Name: Akeylah Hendel MRN: [id number] Date of Birth: [birth year]

## 2022-01-31 ENCOUNTER — Ambulatory Visit: Payer: Medicare PPO | Admitting: Physical Therapy

## 2022-01-31 DIAGNOSIS — M545 Low back pain, unspecified: Secondary | ICD-10-CM | POA: Diagnosis not present

## 2022-01-31 DIAGNOSIS — M62830 Muscle spasm of back: Secondary | ICD-10-CM | POA: Diagnosis not present

## 2022-01-31 DIAGNOSIS — R262 Difficulty in walking, not elsewhere classified: Secondary | ICD-10-CM

## 2022-01-31 DIAGNOSIS — M25561 Pain in right knee: Secondary | ICD-10-CM | POA: Diagnosis not present
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
## 2022-02-04 ENCOUNTER — Ambulatory Visit: Payer: Medicare PPO | Admitting: Physical Therapy

## 2022-02-04 DIAGNOSIS — M25561 Pain in right knee: Secondary | ICD-10-CM | POA: Diagnosis not present

## 2022-02-04 DIAGNOSIS — R262 Difficulty in walking, not elsewhere classified: Secondary | ICD-10-CM

## 2022-02-04 DIAGNOSIS — M62830 Muscle spasm of back: Secondary | ICD-10-CM | POA: Diagnosis not present

## 2022-02-04 DIAGNOSIS — M545 Low back pain, unspecified: Secondary | ICD-10-CM

## 2022-02-04 NOTE — Therapy (Signed)
[place] [place], [place], [address] Phone: [phone]   Fax:  [phone]  Physical Therapy Treatment  Patient Details  Name: Jenna Mcdonald MRN: [id number] Date of Birth: [birth year] Referring Provider (PT): Eustace Moore   Encounter Date: 02/04/2022   PT End of Session - 02/04/22 1000     Visit Number 22    Date for PT Re-Evaluation 03/14/22    Authorization Type [employer]    Authorization Time Period 1/6             Past Medical History:  Diagnosis Date   Anemia    Aortic stenosis    mild by echo 09/2020 with mean AVG 61mHg   Breast CA ([facility]    left   Breast cancer ([facility]    Carotid stenosis    1-39% left   Degenerative disc disease, lumbar    of the knees   Diabetes mellitus    Dysrhythmia    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[place]    Fatigue    Severe secondary to to gemfibrozil   Gastroesophageal reflux disease    denies   GI bleeding    a. 06/2018: EGD showed multiple nonbleeding duodenal ulcers. 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   Hammertoe    left second toe   Hypercholesteremia    Hypertension    Insomnia    Irritable bowel    Low back pain    Metabolic syndrome    Morbid obesity (HCC)    OSA on CPAP \   bipap   Osteoarthritis    Osteopenia    Persistent atrial fibrillation ([facility]    s/p afib ablation - Dr. ARayann Heman1/2020   Personal history of radiation therapy    Pneumonia    PONV (postoperative nausea and vomiting)    Sigmoid diverticulosis    Thalassemia minor    Vitamin D deficiency     Past Surgical History:  Procedure Laterality Date   ATRIAL FIBRILLATION ABLATION N/A 09/21/2018   Procedure: ATRIAL FIBRILLATION ABLATION;  Surgeon: AThompson Grayer MD;  [facility];  Service: Cardiovascular;  Laterality: N/A;   BIOPSY  07/06/2018   Procedure: BIOPSY;  Surgeon: MClarene Essex MD;   [facility];  Service: Endoscopy;;   BREAST LUMPECTOMY Left 05/28/2009   BREAST SURGERY Left 2010   lumpectomy   CARDIOVERSION N/A 07/07/2018   Procedure: CARDIOVERSION;  Surgeon: RSkeet Latch MD;  [facility];  Service: Cardiovascular;  Laterality: N/A;   CARDIOVERSION N/A 07/14/2018   Procedure: CARDIOVERSION;  Surgeon: MLarey Dresser MD;  [facility];  Service: Cardiovascular;  Laterality: N/A;   CESAREAN SECTION     COLONOSCOPY WITH PROPOFOL N/A 07/06/2018   Procedure: COLONOSCOPY WITH PROPOFOL;  Surgeon: MClarene Essex MD;  [place]  Service: Endoscopy;  Laterality: N/A;   CYSTECTOMY  1970   pilonidal    DILATION AND CURETTAGE OF UTERUS     ESOPHAGOGASTRODUODENOSCOPY (EGD) WITH PROPOFOL N/A 07/06/2018   Procedure: ESOPHAGOGASTRODUODENOSCOPY (EGD) WITH PROPOFOL;  Surgeon: MClarene Essex MD;  [place]  Service: Endoscopy;  Laterality: N/A;   EYE SURGERY Bilateral 07/2013   cataracts   HOT HEMOSTASIS N/A 07/06/2018   Procedure: HOT HEMOSTASIS (ARGON PLASMA COAGULATION/BICAP);  Surgeon: MClarene Essex MD;  [place] [place]  Service: Endoscopy;  Laterality: N/A;   implantable loop recorder placement  01/15/2022   Medtronic Reveal Linq model [id number] loop recorder (  SN [id number]) implanted for AF management by Dr Rayann Heman   MAXIMUM ACCESS (MAS)POSTERIOR LUMBAR INTERBODY FUSION (PLIF) 2 LEVEL N/A 09/22/2013   Procedure: FOR MAXIMUM ACCESS  POSTERIOR LUMBAR INTERBODY FUSION LUMBAR THREE-FOUR FOUR-FIVE;  Surgeon: Eustace Moore, MD;  Location: [place] NEURO ORS;  Service: Neurosurgery;  Laterality: N/A;   OOPHORECTOMY     wedge section not rem   REPLACEMENT TOTAL KNEE Left 2011   REPLACEMENT TOTAL KNEE     RIGHT/LEFT HEART CATH AND CORONARY ANGIOGRAPHY N/A 10/26/2020   Procedure: RIGHT/LEFT HEART CATH AND CORONARY ANGIOGRAPHY;  Surgeon: Leonie Man, MD;  Location: [place] CV LAB;  Service: Cardiovascular;   Laterality: N/A;   TEE WITHOUT CARDIOVERSION N/A 07/07/2018   Procedure: TRANSESOPHAGEAL ECHOCARDIOGRAM (TEE);  Surgeon: [name], MD;  Location: [place];  Service: Cardiovascular;  Laterality: N/A;   TEE WITHOUT CARDIOVERSION N/A 09/21/2018   Procedure: TRANSESOPHAGEAL ECHOCARDIOGRAM (TEE);  Surgeon: Acie Fredrickson Wonda Cheng, MD;  Location[facility]OSCOPY;  Service: Cardiovascular;  Laterality: N/A;   TONSILLECTOMY     TUBAL LIGATION      There were no vitals filed for this visit.   Subjective Assessment - 02/04/22 0921     Subjective doing okay, knee cracking. stomach has been bothering. finally got approval for wellness.wt loss program    Currently in Pain? Yes    Pain Score 5     Pain Location Knee                               [facility] Adult PT Treatment/Exercise - 02/04/22 0001       Lumbar Exercises: Aerobic   Nustep level 5 x 6 minutes      Lumbar Exercises: Machines for Strengthening   Cybex Lumbar Extension black tband flex and ext 20 x    Other Lumbar Machine Exercise lat pull and row 20# 2 sets 10,      Lumbar Exercises: Seated   Long Arc Quad on Chair Strengthening;Both;2 sets;10 reps;Weights    LAQ on Chair Weights (lbs) 3    Sit to Stand 10 reps   wt ball press   Other Seated Lumbar Exercises green tband row an dshld ext 2 sets 10   wt ball rotation 10 each   Other Seated Lumbar Exercises green HS curl 2 sets 10      Lumbar Exercises: Supine   Ab Set 15 reps;3 seconds    Other Supine Lumbar Exercises feet on ball KTC, bridge and obl 15 x      Traction   Type of Traction Lumbar    Max (lbs) 55    Hold Time static    Time 15      Manual Therapy   Manual Therapy Passive ROM    Passive ROM LE and trunk                       PT Short Term Goals - 11/13/21 7564       PT SHORT TERM GOAL #1   Title independent with initial HEP    Status Achieved               PT Long Term Goals - 02/04/22 1002       PT LONG  TERM GOAL #1   Title report pain decreased 25%    Status Partially Met      PT LONG TERM GOAL #2   Title increase  lumbar ROM 25%    Status Partially Met      PT LONG TERM GOAL #3   Title report tolerance to standing at 10 minutes to cook a meal    Status Partially Met      PT LONG TERM GOAL #4   Title report that she can walk 10 minutes    Status Partially Met      PT LONG TERM GOAL #5   Title increase LE strength to 4+/5    Status Partially Met                   Plan - 02/04/22 1001     Clinical Impression Statement progressing ther ex with cuing. pt continues to have pain but relief on PT days. pt states wellness program next week. progressing with goals    PT Treatment/Interventions ADLs/Self Care Home Management;Cryotherapy;Electrical Stimulation;Moist Heat;Gait training;Stair training;Functional mobility training;Iontophoresis 13m/ml Dexamethasone;Therapeutic activities;Therapeutic exercise;Balance training;Neuromuscular re-education;Manual techniques;Patient/family education;Dry needling    PT Next Visit Plan renewal done             Patient will benefit from skilled therapeutic intervention in order to improve the following deficits and impairments:  Abnormal gait, Decreased range of motion, Difficulty walking, Decreased mobility, Decreased strength, Postural dysfunction, Improper body mechanics, Impaired flexibility, Decreased balance, Pain, Decreased activity tolerance, Increased muscle spasms  Visit Diagnosis: Acute bilateral low back pain without sciatica  Difficulty in walking, not elsewhere classified  Acute pain of right knee     Problem List Patient Active Problem List   Diagnosis Date Noted   Shortness of breath    Unstable angina (H[place] 10/23/2020   Secondary hypercoagulable state ([place] 10/15/2020   Encounter for therapeutic drug monitoring 08/17/2018   CKD (chronic kidney disease) stage 3, GFR 30-59 ml/min (HCC) 07/21/2018   Chronic  anemia 07/21/2018   Anemia    Heme positive stool    Atrial fibrillation with RVR (HPort [place] 07/04/2018   Carotid stenosis    Bruit of left carotid artery 09/22/2017   Aortic stenosis    Heart murmur 09/09/2016   Morbid obesity (H[place] 02/05/2016   Dry eye 05/17/2015   Type 2 diabetes mellitus without complication (HGilles[place] 116/05/9603  Breast cancer, left breast ([place] 07/22/2014   Obesity, Class III, BMI 40-49.9 (morbid obesity) (HC[place] 07/06/2014   Macular pseudohole 05/17/2014   History of surgical procedure 05/17/2014   OSA (obstructive sleep apnea) 11/17/2013   S/P lumbar spinal fusion 09/22/2013   Persistent atrial fibrillation (HB[place] 09/11/2011    Class: Acute   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HD[place] 09/11/2011    Class: Chronic   Essential hypertension 09/11/2011   Exogenous obesity 09/11/2011   Gastroesophageal reflux disease 09/11/2011   Thalassemia minor 09/11/2011    Class: Chronic   Breast cancer (H[place] 09/11/2011    Class: Chronic   Degenerative joint disease 09/11/2011    Class: Chronic   Irritable bowel disease 09/11/2011    Class: Chronic   Lumbar disc disease 09/11/2011    Metha Kolasa,ANGIE, PTA 02/04/2022, 10:04 AM  [place] [address]Phone: [phone]  Fax:  [phone] Name: PLaqueena HincheyMRN: [id number] of Birth: 2Oct 12, 1947

## 2022-02-06 ENCOUNTER — Ambulatory Visit: Payer: Medicare PPO | Admitting: Physical Therapy

## 2022-02-12 DIAGNOSIS — R1084 Generalized abdominal pain: Secondary | ICD-10-CM | POA: Diagnosis not present

## 2022-02-13 ENCOUNTER — Ambulatory Visit: Payer: Medicare PPO | Admitting: Physical Therapy

## 2022-02-13 ENCOUNTER — Ambulatory Visit
Admission: RE | Admit: 2022-02-13 | Discharge: 2022-02-13 | Disposition: A | Discharge status: Home / Self Care | Payer: Medicare PPO | Source: Ambulatory Visit | Attending: Internal Medicine | Admitting: Internal Medicine

## 2022-02-13 ENCOUNTER — Other Ambulatory Visit: Payer: Self-pay | Admitting: Internal Medicine

## 2022-02-13 DIAGNOSIS — K869 Disease of pancreas, unspecified: Secondary | ICD-10-CM

## 2022-02-13 DIAGNOSIS — R1084 Generalized abdominal pain: Secondary | ICD-10-CM

## 2022-02-13 DIAGNOSIS — K5730 Diverticulosis of large intestine without perforation or abscess without bleeding: Secondary | ICD-10-CM | POA: Diagnosis not present

## 2022-02-13 MED ORDER — IOPAMIDOL (ISOVUE-300) INJECTION 61%
80.0000 mL | Freq: Once | INTRAVENOUS | Status: AC | PRN
  Administered 2022-02-13: 80 mL via INTRAVENOUS

## 2022-02-14 ENCOUNTER — Other Ambulatory Visit: Payer: Medicare PPO

## 2022-02-14 ENCOUNTER — Ambulatory Visit
Admission: RE | Admit: 2022-02-14 | Discharge: 2022-02-14 | Disposition: A | Discharge status: Home / Self Care | Payer: Medicare PPO | Source: Ambulatory Visit | Attending: Internal Medicine | Admitting: Internal Medicine

## 2022-02-14 DIAGNOSIS — K769 Liver disease, unspecified: Secondary | ICD-10-CM | POA: Diagnosis not present

## 2022-02-14 DIAGNOSIS — K869 Disease of pancreas, unspecified: Secondary | ICD-10-CM

## 2022-02-14 DIAGNOSIS — K862 Cyst of pancreas: Secondary | ICD-10-CM | POA: Diagnosis not present

## 2022-02-14 MED ORDER — GADOBENATE DIMEGLUMINE 529 MG/ML IV SOLN
20.0000 mL | Freq: Once | INTRAVENOUS | Status: AC | PRN
  Administered 2022-02-14: 20 mL via INTRAVENOUS

## 2022-02-19 ENCOUNTER — Ambulatory Visit (INDEPENDENT_AMBULATORY_CARE_PROVIDER_SITE_OTHER): Payer: Medicare PPO

## 2022-02-19 DIAGNOSIS — I480 Paroxysmal atrial fibrillation: Secondary | ICD-10-CM

## 2022-02-20 DIAGNOSIS — M1711 Unilateral primary osteoarthritis, right knee: Secondary | ICD-10-CM | POA: Diagnosis not present

## 2022-02-20 LAB — CUP PACEART REMOTE DEVICE CHECK
Date Time Interrogation Session: 20230627204933
Implantable Pulse Generator Implant Date: 20230524

## 2022-02-21 ENCOUNTER — Ambulatory Visit: Payer: Medicare PPO | Admitting: Physical Therapy

## 2022-02-21 DIAGNOSIS — R262 Difficulty in walking, not elsewhere classified: Secondary | ICD-10-CM | POA: Diagnosis not present

## 2022-02-21 DIAGNOSIS — M545 Low back pain, unspecified: Secondary | ICD-10-CM | POA: Diagnosis not present

## 2022-02-21 DIAGNOSIS — M25561 Pain in right knee: Secondary | ICD-10-CM

## 2022-02-21 DIAGNOSIS — M62830 Muscle spasm of back: Secondary | ICD-10-CM | POA: Diagnosis not present

## 2022-02-21 NOTE — Therapy (Signed)
[place]. [place], [place], [address] Phone: [phone]   Fax:  [phone]  Physical Therapy Treatment  Patient Details  Name: Jenna Mcdonald MRN: [id number] Date of Birth: June 09, 1946 Referring Provider (PT): Eustace Moore   Encounter Date: 02/21/2022   PT End of Session - 02/21/22 0950     Visit Number 23    Date for PT Re-Evaluation 03/14/22    Authorization Type [employer]    PT Start Time 0915    PT Stop Time 1000    PT Time Calculation (min) 45 min             Past Medical History:  Diagnosis Date   Anemia    Aortic stenosis    mild by echo 09/2020 with mean AVG 68mHg   Breast CA ([facility]    left   Breast cancer ([facility]    Carotid stenosis    1-39% left   Degenerative disc disease, lumbar    of the knees   Diabetes mellitus    Dysrhythmia    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[facility]    Fatigue    Severe secondary to to gemfibrozil   Gastroesophageal reflux disease    denies   GI bleeding    a. 06/2018: EGD showed multiple nonbleeding duodenal ulcers. 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   Hammertoe    left second toe   Hypercholesteremia    Hypertension    Insomnia    Irritable bowel    Low back pain    Metabolic syndrome    Morbid obesity (HCC)    OSA on CPAP \   bipap   Osteoarthritis    Osteopenia    Persistent atrial fibrillation ([facility]    s/p afib ablation - Dr. ARayann Heman1/2020   Personal history of radiation therapy    Pneumonia    PONV (postoperative nausea and vomiting)    Sigmoid diverticulosis    Thalassemia minor    Vitamin D deficiency     Past Surgical History:  Procedure Laterality Date   ATRIAL FIBRILLATION ABLATION N/A 09/21/2018   Procedure: ATRIAL FIBRILLATION ABLATION;  Surgeon: AThompson Grayer MD;  [facility];  Service: Cardiovascular;  Laterality: N/A;   BIOPSY  07/06/2018    Procedure: BIOPSY;  Surgeon: MClarene Essex MD;  [facility];  Service: Endoscopy;;   BREAST LUMPECTOMY Left 05/28/2009   BREAST SURGERY Left 2010   lumpectomy   CARDIOVERSION N/A 07/07/2018   Procedure: CARDIOVERSION;  Surgeon: RSkeet Latch MD;  [facility]  Service: Cardiovascular;  Laterality: N/A;   CARDIOVERSION N/A 07/14/2018   Procedure: CARDIOVERSION;  Surgeon: MLarey Dresser MD;  [facility];  Service: Cardiovascular;  Laterality: N/A;   CESAREAN SECTION     COLONOSCOPY WITH PROPOFOL N/A 07/06/2018   Procedure: COLONOSCOPY WITH PROPOFOL;  Surgeon: MClarene Essex MD;  [place]  Service: Endoscopy;  Laterality: N/A;   CYSTECTOMY  1970   pilonidal    DILATION AND CURETTAGE OF UTERUS     ESOPHAGOGASTRODUODENOSCOPY (EGD) WITH PROPOFOL N/A 07/06/2018   Procedure: ESOPHAGOGASTRODUODENOSCOPY (EGD) WITH PROPOFOL;  Surgeon: MClarene Essex MD;  [place]  Service: Endoscopy;  Laterality: N/A;   EYE SURGERY Bilateral 07/2013   cataracts   HOT HEMOSTASIS N/A 07/06/2018   Procedure: HOT HEMOSTASIS (ARGON PLASMA COAGULATION/BICAP);  Surgeon: MClarene Essex MD;  [place]  Service: Endoscopy;  Laterality: N/A;  implantable loop recorder placement  01/15/2022   Medtronic Reveal Linq model [id number] implantable loop recorder (SN [id number]) implanted for AF management by Dr Rayann Heman   MAXIMUM ACCESS (MAS)POSTERIOR LUMBAR INTERBODY FUSION (PLIF) 2 LEVEL N/A 09/22/2013   Procedure: FOR MAXIMUM ACCESS  POSTERIOR LUMBAR INTERBODY FUSION LUMBAR THREE-FOUR FOUR-FIVE;  Surgeon: Eustace Moore, MD;  Location: [place] NEURO ORS;  Service: Neurosurgery;  Laterality: N/A;   OOPHORECTOMY     wedge section not rem   REPLACEMENT TOTAL KNEE Left 2011   REPLACEMENT TOTAL KNEE     RIGHT/LEFT HEART CATH AND CORONARY ANGIOGRAPHY N/A 10/26/2020   Procedure: RIGHT/LEFT HEART CATH AND CORONARY ANGIOGRAPHY;  Surgeon: Leonie Man, MD;  Location: [place] CV LAB;  Service: Cardiovascular;  Laterality: N/A;   TEE WITHOUT CARDIOVERSION N/A 07/07/2018   Procedure: TRANSESOPHAGEAL ECHOCARDIOGRAM (TEE);  Surgeon: [name], MD;  Location: [place];  Service: Cardiovascular;  Laterality: N/A;   TEE WITHOUT CARDIOVERSION N/A 09/21/2018   Procedure: TRANSESOPHAGEAL ECHOCARDIOGRAM (TEE);  Surgeon: Acie Fredrickson Wonda Cheng, MD;  Location: [facility]COPY;  Service: Cardiovascular;  Laterality: N/A;   TONSILLECTOMY     TUBAL LIGATION      There were no vitals filed for this visit.   Subjective Assessment - 02/21/22 0942     Subjective been a rough few weeks- abdominal hurting had blood work , CT and MRI, spot on kidney and pancrease but they are just watching as they feel not cance. blood work shoed ecoli so given antbiotic. better but not great. rt knee is awful. really not sure what i can do but hated to cancel again                               [facility] Adult PT Treatment/Exercise - 02/21/22 0001       Lumbar Exercises: Machines for Strengthening   Cybex Lumbar Extension black tband flex and ext 20 x    Other Lumbar Machine Exercise lat pull and row 20# 2 sets 10,      Manual Therapy   Manual Therapy Passive ROM    Manual therapy comments increased BIL swelling +2 pitting edema with painful and tenderness RT calf med/lateral ( no heat or redness)    Soft tissue mobilization retrogarde massage in elevation BIL    Passive ROM LE and trunk                       PT Short Term Goals - 11/13/21 1916       PT SHORT TERM GOAL #1   Title independent with initial HEP    Status Achieved               PT Long Term Goals - 02/04/22 1002       PT LONG TERM GOAL #1   Title report pain decreased 25%    Status Partially Met      PT LONG TERM GOAL #2   Title increase lumbar ROM 25%    Status Partially Met      PT LONG TERM GOAL #3   Title report tolerance to standing at 10 minutes to cook a meal     Status Partially Met      PT LONG TERM GOAL #4   Title report that she can walk 10 minutes    Status Partially Met      PT LONG TERM GOAL #5   Title  increase LE strength to 4+/5    Status Partially Met                   Plan - 02/21/22 0950     Clinical Impression Statement arrived with increased pain and unsure of what she can tolerate ( see subj) pt declines knee ex d/t pain. BIL LE swelling and tenderness RT calf. PROM and STW to LE and trunk. educ on elevation above heart at home 20-30 min 2 x daily to see if helps with swelling. trunk ex. no goals met d/t set back with other issues. no tarction d/t abdominal issues    PT Treatment/Interventions ADLs/Self Care Home Management;Cryotherapy;Electrical Stimulation;Moist Heat;Gait training;Stair training;Functional mobility training;Iontophoresis 7m/ml Dexamethasone;Therapeutic activities;Therapeutic exercise;Balance training;Neuromuscular re-education;Manual techniques;Patient/family education;Dry needling    PT Next Visit Plan assess and progress as tolerated             Patient will benefit from skilled therapeutic intervention in order to improve the following deficits and impairments:  Abnormal gait, Decreased range of motion, Difficulty walking, Decreased mobility, Decreased strength, Postural dysfunction, Improper body mechanics, Impaired flexibility, Decreased balance, Pain, Decreased activity tolerance, Increased muscle spasms  Visit Diagnosis: Acute bilateral low back pain without sciatica  Difficulty in walking, not elsewhere classified  Acute pain of right knee     Problem List Patient Active Problem List   Diagnosis Date Noted   Shortness of breath    Unstable angina ([place] 10/23/2020   Secondary hypercoagulable state ([facility] 10/15/2020   Encounter for therapeutic drug monitoring 08/17/2018   CKD (chronic kidney disease) stage 3, GFR 30-59 ml/min (HCC) 07/21/2018   Chronic anemia 07/21/2018   Anemia     Heme positive stool    Atrial fibrillation with RVR ([facility] 07/04/2018   Carotid stenosis    Bruit of left carotid artery 09/22/2017   Aortic stenosis    Heart murmur 09/09/2016   Morbid obesity ([facility] [place] 02/05/2016   Dry eye 05/17/2015   Type 2 diabetes mellitus without complication (HD[place] 147/42/5956  Breast cancer, left breast ([facility] 07/22/2014   Obesity, Class III, BMI 40-49.9 (morbid obesity) ([facility] [place] 07/06/2014   Macular pseudohole 05/17/2014   History of surgical procedure 05/17/2014   OSA (obstructive sleep apnea) 11/17/2013   S/P lumbar spinal fusion 09/22/2013   Persistent atrial fibrillation (HH[place] 09/11/2011    Class: Acute   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HD[place] 09/11/2011    Class: Chronic   Essential hypertension 09/11/2011   Exogenous obesity 09/11/2011   Gastroesophageal reflux disease 09/11/2011   Thalassemia minor 09/11/2011    Class: Chronic   Breast cancer (H[facility] [place] 09/11/2011    Class: Chronic   Degenerative joint disease 09/11/2011    Class: Chronic   Irritable bowel disease 09/11/2011    Class: Chronic   Lumbar disc disease 09/11/2011    Jenna Mcdonald,Jenna Mcdonald, PTA 02/21/2022, 9:53 AM  [place] [address]Phone: [phone]  Fax:  [phone] Name: PKylinn ShropshireMRN: [id number] of Birth: 2August 24, 1947

## 2022-02-27 ENCOUNTER — Ambulatory Visit: Payer: Medicare PPO | Attending: Neurological Surgery | Admitting: Physical Therapy

## 2022-02-27 DIAGNOSIS — M545 Low back pain, unspecified: Secondary | ICD-10-CM

## 2022-02-27 DIAGNOSIS — R262 Difficulty in walking, not elsewhere classified: Secondary | ICD-10-CM | POA: Diagnosis not present

## 2022-02-27 DIAGNOSIS — M62830 Muscle spasm of back: Secondary | ICD-10-CM | POA: Insufficient documentation

## 2022-02-27 DIAGNOSIS — M25561 Pain in right knee: Secondary | ICD-10-CM

## 2022-02-27 NOTE — Therapy (Signed)
[place]. [place], [place], [address] Phone: [phone]   Fax:  [phone]  Physical Therapy Treatment  Patient Details  Name: Jenna Mcdonald MRN: [id number] Date of Birth: 03-Jan-1946 Referring Provider (PT): Eustace Moore   Encounter Date: 02/27/2022   PT End of Session - 02/27/22 1058     Visit Number 24    Date for PT Re-Evaluation 03/14/22    PT Start Time 1015    PT Stop Time 1100    PT Time Calculation (min) 45 min             Past Medical History:  Diagnosis Date   Anemia    Aortic stenosis    mild by echo 09/2020 with mean AVG 49mHg   Breast CA ([place]    left   Breast cancer ([facility]    Carotid stenosis    1-39% left   Degenerative disc disease, lumbar    of the knees   Diabetes mellitus    Dysrhythmia    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HS[place]    Fatigue    Severe secondary to to gemfibrozil   Gastroesophageal reflux disease    denies   GI bleeding    a. 06/2018: EGD showed multiple nonbleeding duodenal ulcers. 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   Hammertoe    left second toe   Hypercholesteremia    Hypertension    Insomnia    Irritable bowel    Low back pain    Metabolic syndrome    Morbid obesity (HCC)    OSA on CPAP \   bipap   Osteoarthritis    Osteopenia    [facility]    s/p afib ablation - Dr. ARayann Heman1/2020   Personal history of radiation therapy    Pneumonia    PONV (postoperative nausea and vomiting)    Sigmoid diverticulosis    Thalassemia minor    Vitamin D deficiency     Past Surgical History:  Procedure Laterality Date   ATRIAL FIBRILLATION ABLATION N/A 09/21/2018   Procedure: ATRIAL FIBRILLATION ABLATION;  Surgeon: AThompson Grayer MD;  [place] [place];  Service: Cardiovascular;  Laterality: N/A;   BIOPSY  07/06/2018   Procedure: BIOPSY;  Surgeon: MClarene Essex MD;  [facility];  Service: Endoscopy;;   BREAST LUMPECTOMY Left 05/28/2009   BREAST SURGERY Left 2010   lumpectomy   CARDIOVERSION N/A 07/07/2018   Procedure: CARDIOVERSION;  Surgeon: RSkeet Latch MD;  [facility];  Service: Cardiovascular;  Laterality: N/A;   CARDIOVERSION N/A 07/14/2018   Procedure: CARDIOVERSION;  Surgeon: MLarey Dresser MD;  [facility];  Service: Cardiovascular;  Laterality: N/A;   CESAREAN SECTION     COLONOSCOPY WITH PROPOFOL N/A 07/06/2018   Procedure: COLONOSCOPY WITH PROPOFOL;  Surgeon: MClarene Essex MD;  [place]  Service: Endoscopy;  Laterality: N/A;   CYSTECTOMY  1970   pilonidal    DILATION AND CURETTAGE OF UTERUS     ESOPHAGOGASTRODUODENOSCOPY (EGD) WITH PROPOFOL N/A 07/06/2018   Procedure: ESOPHAGOGASTRODUODENOSCOPY (EGD) WITH PROPOFOL;  Surgeon: MClarene Essex MD;  Location: [place]  Service: Endoscopy;  Laterality: N/A;   EYE SURGERY Bilateral 07/2013   cataracts   HOT HEMOSTASIS N/A 07/06/2018   Procedure: HOT HEMOSTASIS (ARGON PLASMA COAGULATION/BICAP);  Surgeon: MClarene Essex MD;  Location: [place]  Service: Endoscopy;  Laterality: N/A;   implantable loop recorder placement  01/15/2022  Medtronic Reveal Linq model [id number] implantable loop recorder (SN [id number]) implanted for AF management by Dr Rayann Heman   MAXIMUM ACCESS (MAS)POSTERIOR LUMBAR INTERBODY FUSION (PLIF) 2 LEVEL N/A 09/22/2013   Procedure: FOR MAXIMUM ACCESS  POSTERIOR LUMBAR INTERBODY FUSION LUMBAR THREE-FOUR FOUR-FIVE;  Surgeon: Eustace Moore, MD;  Location: [place] NEURO ORS;  Service: Neurosurgery;  Laterality: N/A;   OOPHORECTOMY     wedge section not rem   REPLACEMENT TOTAL KNEE Left 2011   REPLACEMENT TOTAL KNEE     RIGHT/LEFT HEART CATH AND CORONARY ANGIOGRAPHY N/A 10/26/2020   Procedure: RIGHT/LEFT HEART CATH AND CORONARY ANGIOGRAPHY;  Surgeon: Leonie Man, MD;  Location: [place] CV LAB;  Service:  Cardiovascular;  Laterality: N/A;   TEE WITHOUT CARDIOVERSION N/A 07/07/2018   Procedure: TRANSESOPHAGEAL ECHOCARDIOGRAM (TEE);  Surgeon: [name], MD;  Location: [place];  Service: Cardiovascular;  Laterality: N/A;   TEE WITHOUT CARDIOVERSION N/A 09/21/2018   Procedure: TRANSESOPHAGEAL ECHOCARDIOGRAM (TEE);  Surgeon: Acie Fredrickson Wonda Cheng, MD;  Location[facility] ENDOSCOPY;  Service: Cardiovascular;  Laterality: N/A;   TONSILLECTOMY     TUBAL LIGATION      There were no vitals filed for this visit.   Subjective Assessment - 02/27/22 1019     Subjective legs are tender and cramps but elevation helps. abdominal better after antibiotics.    Currently in Pain? Yes    Pain Location Knee    Multiple Pain Sites Yes    Pain Score 5    Pain Location Back                               [facility] Adult PT Treatment/Exercise - 02/27/22 0001       Lumbar Exercises: Machines for Strengthening   Cybex Lumbar Extension black tband flex and ext 20 x    Other Lumbar Machine Exercise lat pull and row 20# 2 sets 10,      Lumbar Exercises: Standing   Other Standing Lumbar Exercises standing hip ext, flex and abd 3# 10 x      Lumbar Exercises: Seated   Other Seated Lumbar Exercises on dyna disc pelvic ROM 15 x 4 way, green tband trunk rotation 12 each way,row and shld ext 12 x each. 3# LAQ,hip flex and abd 12 x each   cued to activate core     Lumbar Exercises: Supine   Ab Set 15 reps;3 seconds    Bridge Non-compliant;15 reps    Other Supine Lumbar Exercises knee to chest ,trunk rotation   wth ball     Manual Therapy   Manual Therapy Passive ROM    Passive ROM LE and trunk                       PT Short Term Goals - 11/13/21 5638       PT SHORT TERM GOAL #1   Title independent with initial HEP    Status Achieved               PT Long Term Goals - 02/27/22 1059       PT LONG TERM GOAL #1   Title report pain decreased 25%    Status Partially  Met      PT LONG TERM GOAL #2   Title increase lumbar ROM 25%    Status Achieved      PT LONG TERM GOAL #3   Title report tolerance to standing  at 10 minutes to cook a meal    Baseline about 5 min    Status Partially Met      PT LONG TERM GOAL #4   Title report that she can walk 10 minutes    Baseline currently reports that she cannot walk more than 5 minutes    Status Partially Met      PT LONG TERM GOAL #5   Title increase LE strength to 4+/5    Status Partially Met                   Plan - 02/27/22 1058     Clinical Impression Statement pt arrived feeling much better than last session. tolerated increased ex and activity. started talking about what ex to do after D/C as she has 3 visits left    PT Treatment/Interventions ADLs/Self Care Home Management;Cryotherapy;Electrical Stimulation;Moist Heat;Gait training;Stair training;Functional mobility training;Iontophoresis 13m/ml Dexamethasone;Therapeutic activities;Therapeutic exercise;Balance training;Neuromuscular re-education;Manual techniques;Patient/family education;Dry needling    PT Next Visit Plan 3 visits left... prepare for D/C             Patient will benefit from skilled therapeutic intervention in order to improve the following deficits and impairments:  Abnormal gait, Decreased range of motion, Difficulty walking, Decreased mobility, Decreased strength, Postural dysfunction, Improper body mechanics, Impaired flexibility, Decreased balance, Pain, Decreased activity tolerance, Increased muscle spasms  Visit Diagnosis: Acute bilateral low back pain without sciatica  Difficulty in walking, not elsewhere classified  Acute pain of right knee     Problem List Patient Active Problem List   Diagnosis Date Noted   Shortness of breath    Unstable angina (HSol[place] 10/23/2020   Secondary hypercoagulable state (HB[place] 10/15/2020   Encounter for therapeutic drug monitoring 08/17/2018   CKD (chronic kidney disease)  stage 3, GFR 30-59 ml/min (HCC) 07/21/2018   Chronic anemia 07/21/2018   Anemia    Heme positive stool    Atrial fibrillation with RVR (HR[place] 07/04/2018   Carotid stenosis    Bruit of left carotid artery 09/22/2017   Aortic stenosis    Heart murmur 09/09/2016   Morbid obesity (HH[place] 02/05/2016   Dry eye 05/17/2015   Type 2 diabetes mellitus without complication (H[place] 109/81/1914  Breast cancer, left breast (H[place] 07/22/2014   Obesity, Class III, BMI 40-49.9 (morbid obesity) (HT[place] 07/06/2014   Macular pseudohole 05/17/2014   History of surgical procedure 05/17/2014   OSA (obstructive sleep apnea) 11/17/2013   S/P lumbar spinal fusion 09/22/2013   Persistent atrial fibrillation (H[place] 09/11/2011    Class: Acute   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HMad[place] 09/11/2011    Class: Chronic   Essential hypertension 09/11/2011   Exogenous obesity 09/11/2011   Gastroesophageal reflux disease 09/11/2011   Thalassemia minor 09/11/2011    Class: Chronic   Breast cancer (HEll[place] 09/11/2011    Class: Chronic   Degenerative joint disease 09/11/2011    Class: Chronic   Irritable bowel disease 09/11/2011    Class: Chronic   Lumbar disc disease 09/11/2011    Dayvin Aber,ANGIE, PTA 02/27/2022, 11:01 AM  [place] [address]Phone: [phone]  Fax:  [phone] Name: PCletus MehlhoffMRN: [id number] of Birth: [birth year]

## 2022-03-03 ENCOUNTER — Telehealth: Payer: Self-pay

## 2022-03-03 NOTE — Telephone Encounter (Signed)
The patient called to get clarification about her upcoming remote checks. She asked me is it normal to have some soreness where her arm pit start and the  area in between the implant? I told her the nurse will give her a call at the the [phone].

## 2022-03-03 NOTE — Telephone Encounter (Signed)
Spoke with patient, patient stated that she has been doing PT and noted the soreness after PT appointments informed patient that this is normal to have some soreness after exercise with a newer implant, patient denied redness swelling or drainage at incision site.

## 2022-03-05 ENCOUNTER — Encounter: Payer: Self-pay | Admitting: Physical Therapy

## 2022-03-05 ENCOUNTER — Ambulatory Visit: Payer: Medicare PPO | Admitting: Physical Therapy

## 2022-03-05 DIAGNOSIS — M62830 Muscle spasm of back: Secondary | ICD-10-CM | POA: Diagnosis not present

## 2022-03-05 DIAGNOSIS — M25561 Pain in right knee: Secondary | ICD-10-CM

## 2022-03-05 DIAGNOSIS — R262 Difficulty in walking, not elsewhere classified: Secondary | ICD-10-CM

## 2022-03-05 DIAGNOSIS — M545 Low back pain, unspecified: Secondary | ICD-10-CM | POA: Diagnosis not present

## 2022-03-05 NOTE — Therapy (Signed)
[facility]. [place], [place], [address] Phone: [phone]   Fax:  [phone]  Physical Therapy Treatment  Patient Details  Name: Jenna Mcdonald MRN: [id number] Date of Birth: 09/07/1945 Referring Provider (PT): Eustace Moore   Encounter Date: 03/05/2022   PT End of Session - 03/05/22 1315     Visit Number 25    Date for PT Re-Evaluation 03/14/22    Authorization Type [employer]    Authorization Time Period 4/6    PT Start Time 1308    PT Stop Time 1400    PT Time Calculation (min) 52 min    Activity Tolerance Patient tolerated treatment well    Behavior During Therapy [facility] for tasks assessed/performed             Past Medical History:  Diagnosis Date   Anemia    Aortic stenosis    mild by echo 09/2020 with mean AVG 38mHg   Breast CA ([place]    left   Breast cancer ([place]    Carotid stenosis    1-39% left   Degenerative disc disease, lumbar    of the knees   Diabetes mellitus    Dysrhythmia    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[place]    Fatigue    Severe secondary to to gemfibrozil   Gastroesophageal reflux disease    denies   GI bleeding    a. 06/2018: EGD showed multiple nonbleeding duodenal ulcers. 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   Hammertoe    left second toe   Hypercholesteremia    Hypertension    Insomnia    Irritable bowel    Low back pain    Metabolic syndrome    Morbid obesity (HCC)    OSA on CPAP \   bipap   Osteoarthritis    Osteopenia    Persistent atrial fibrillation ([facility]    s/p afib ablation - Dr. ARayann Heman1/2020   Personal history of radiation therapy    Pneumonia    PONV (postoperative nausea and vomiting)    Sigmoid diverticulosis    Thalassemia minor    Vitamin D deficiency     Past Surgical History:  Procedure Laterality Date   ATRIAL FIBRILLATION ABLATION N/A 09/21/2018   Procedure: ATRIAL  FIBRILLATION ABLATION;  Surgeon: AThompson Grayer MD;  [place];  Service: Cardiovascular;  Laterality: N/A;   BIOPSY  07/06/2018   Procedure: BIOPSY;  Surgeon: MClarene Essex MD;  [facility];  Service: Endoscopy;;   BREAST LUMPECTOMY Left 05/28/2009   BREAST SURGERY Left 2010   lumpectomy   CARDIOVERSION N/A 07/07/2018   Procedure: CARDIOVERSION;  Surgeon: RSkeet Latch MD;  [facility];  Service: Cardiovascular;  Laterality: N/A;   CARDIOVERSION N/A 07/14/2018   Procedure: CARDIOVERSION;  Surgeon: MLarey Dresser MD;  [facility];  Service: Cardiovascular;  Laterality: N/A;   CESAREAN SECTION     COLONOSCOPY WITH PROPOFOL N/A 07/06/2018   Procedure: COLONOSCOPY WITH PROPOFOL;  Surgeon: MClarene Essex MD;  [place]  Service: Endoscopy;  Laterality: N/A;   CYSTECTOMY  1970   pilonidal    DILATION AND CURETTAGE OF UTERUS     ESOPHAGOGASTRODUODENOSCOPY (EGD) WITH PROPOFOL N/A 07/06/2018   Procedure: ESOPHAGOGASTRODUODENOSCOPY (EGD) WITH PROPOFOL;  Surgeon: MClarene Essex MD;  [place]  Service: Endoscopy;  Laterality: N/A;   EYE SURGERY Bilateral 07/2013   cataracts   HOT HEMOSTASIS N/A  07/06/2018   Procedure: HOT HEMOSTASIS (ARGON PLASMA COAGULATION/BICAP);  Surgeon: Clarene Essex, MD;  Location: [place];  Service: Endoscopy;  Laterality: N/A;   implantable loop recorder placement  01/15/2022   Medtronic Reveal Linq model [id number] implantable loop recorder (SN [id number]) implanted for AF management by Dr Rayann Heman   MAXIMUM ACCESS (MAS)POSTERIOR LUMBAR INTERBODY FUSION (PLIF) 2 LEVEL N/A 09/22/2013   Procedure: FOR MAXIMUM ACCESS  POSTERIOR LUMBAR INTERBODY FUSION LUMBAR THREE-FOUR FOUR-FIVE;  Surgeon: Eustace Moore, MD;  Location: [place] NEURO ORS;  Service: Neurosurgery;  Laterality: N/A;   OOPHORECTOMY     wedge section not rem   REPLACEMENT TOTAL KNEE Left 2011   REPLACEMENT TOTAL KNEE     RIGHT/LEFT HEART  CATH AND CORONARY ANGIOGRAPHY N/A 10/26/2020   Procedure: RIGHT/LEFT HEART CATH AND CORONARY ANGIOGRAPHY;  Surgeon: Leonie Man, MD;  Location: [place] CV LAB;  Service: Cardiovascular;  Laterality: N/A;   TEE WITHOUT CARDIOVERSION N/A 07/07/2018   Procedure: TRANSESOPHAGEAL ECHOCARDIOGRAM (TEE);  Surgeon: [name], MD;  Location: [place];  Service: Cardiovascular;  Laterality: N/A;   TEE WITHOUT CARDIOVERSION N/A 09/21/2018   Procedure: TRANSESOPHAGEAL ECHOCARDIOGRAM (TEE);  Surgeon: Acie Fredrickson Wonda Cheng, MD;  Location: [facility]COPY;  Service: Cardiovascular;  Laterality: N/A;   TONSILLECTOMY     TUBAL LIGATION      There were no vitals filed for this visit.   Subjective Assessment - 03/05/22 1315     Subjective I have started a diet and i am doing well, we weighed here today and she was 241# she feels that is 7-9 # of loss over the past 2 weeks, reports that she feels like she is moving better and having less back pain, still leg pain  Still with leg cramps at night    Currently in Pain? Yes    Pain Score 4     Pain Location Knee    Pain Orientation Right    Pain Descriptors / Indicators Sore    Aggravating Factors  walking                               [facility] Adult PT Treatment/Exercise - 03/05/22 0001       Lumbar Exercises: Aerobic   Nustep level 5 x 8 minutes      Lumbar Exercises: Machines for Strengthening   Cybex Lumbar Extension black tband flex and ext 20 x    Other Lumbar Machine Exercise lat pull and row 20# 2 sets 10,    Other Lumbar Machine Exercise 5# straight arm pulls cues for posture and core activation      Lumbar Exercises: Standing   Other Standing Lumbar Exercises standing hip ext, flex and abd 3# 2x10                     PT Education - 03/05/22 1414     Education Details Spent quite some time on education of what to do after PT, educated on BMI, BMR, calories and talked about exercises that she could do  at home    Person(s) Educated Patient    Methods Explanation;Demonstration;Handout    Comprehension Verbalized understanding              PT Short Term Goals - 11/13/21 0937       PT SHORT TERM GOAL #1   Title independent with initial HEP    Status Achieved  PT Long Term Goals - 03/05/22 1415       PT LONG TERM GOAL #1   Title report pain decreased 25%    Status Partially Met      PT LONG TERM GOAL #2   Title increase lumbar ROM 25%    Status Achieved      PT LONG TERM GOAL #3   Title report tolerance to standing at 10 minutes to cook a meal    Status Achieved                   Plan - 03/05/22 1415     Clinical Impression Statement REally started the education process for patient for after D/C, she has changed her diet and lost 12 # over the past 2 months.  We talked about BMI, BMR, calories, exercises etc..  We talked about her going to a pool or a gym, she reports that she needs a TKA but the MD won't do it due to her BMI.  She feels like she is moving better and easier    PT Next Visit Plan next week will need to give her handout on the exercises and the weights so she can take that to the gym    Consulted and Agree with Plan of Care Patient             Patient will benefit from skilled therapeutic intervention in order to improve the following deficits and impairments:  Abnormal gait, Decreased range of motion, Difficulty walking, Decreased mobility, Decreased strength, Postural dysfunction, Improper body mechanics, Impaired flexibility, Decreased balance, Pain, Decreased activity tolerance, Increased muscle spasms  Visit Diagnosis: Acute bilateral low back pain without sciatica  Difficulty in walking, not elsewhere classified  Acute pain of right knee  Muscle spasm of back     Problem List Patient Active Problem List   Diagnosis Date Noted   Shortness of breath    Unstable angina ([place]) 10/23/2020   Secondary  hypercoagulable state ([place]) 10/15/2020   Encounter for therapeutic drug monitoring 08/17/2018   CKD (chronic kidney disease) stage 3, GFR 30-59 ml/min (HCC) 07/21/2018   Chronic anemia 07/21/2018   Anemia    Heme positive stool    Atrial fibrillation with RVR ([place]) 07/04/2018   Carotid stenosis    Bruit of left carotid artery 09/22/2017   Aortic stenosis    Heart murmur 09/09/2016   Morbid obesity ([place]) 02/05/2016   Dry eye 05/17/2015   Type 2 diabetes mellitus without complication ([place]) 05/31/1218   Breast cancer, left breast ([place]) 07/22/2014   Obesity, Class III, BMI 40-49.9 (morbid obesity) ([place]) 07/06/2014   Macular pseudohole 05/17/2014   History of surgical procedure 05/17/2014   OSA (obstructive sleep apnea) 11/17/2013   S/P lumbar spinal fusion 09/22/2013   Persistent atrial fibrillation ([place]) 09/11/2011    Class: Acute   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[place]) 09/11/2011    Class: Chronic   Essential hypertension 09/11/2011   Exogenous obesity 09/11/2011   Gastroesophageal reflux disease 09/11/2011   Thalassemia minor 09/11/2011    Class: Chronic   Breast cancer ([place]) 09/11/2011    Class: Chronic   Degenerative joint disease 09/11/2011    Class: Chronic   Irritable bowel disease 09/11/2011    Class: Chronic   Lumbar disc disease 09/11/2011    Sumner Boast, PT 03/05/2022, 2:18 PM  [facility]. [place], [place], [address] Phone: [phone]   Fax:  [phone]  Name: Mardene Celeste  Harlym Gehling MRN: [id number] Date of Birth: 04-Oct-1945

## 2022-03-06 ENCOUNTER — Ambulatory Visit: Payer: Medicare PPO | Admitting: Physical Therapy

## 2022-03-06 DIAGNOSIS — M25561 Pain in right knee: Secondary | ICD-10-CM | POA: Diagnosis not present

## 2022-03-06 DIAGNOSIS — M545 Low back pain, unspecified: Secondary | ICD-10-CM

## 2022-03-06 DIAGNOSIS — R262 Difficulty in walking, not elsewhere classified: Secondary | ICD-10-CM | POA: Diagnosis not present

## 2022-03-06 DIAGNOSIS — M62830 Muscle spasm of back: Secondary | ICD-10-CM | POA: Diagnosis not present

## 2022-03-06 NOTE — Therapy (Signed)
[facility]. [place], [place], [address] Phone: [phone]   Fax:  [phone]  Physical Therapy Treatment  Patient Details  Name: Jenna Mcdonald MRN: [id number] Date of Birth: [birth year] Referring Provider (PT): Eustace Moore   Encounter Date: 03/06/2022   PT End of Session - 03/06/22 1012     Visit Number 26    Date for PT Re-Evaluation 03/14/22    Authorization Type [employer]    PT Start Time 0927    PT Stop Time 5176    PT Time Calculation (min) 48 min             Past Medical History:  Diagnosis Date   Anemia    Aortic stenosis    mild by echo 09/2020 with mean AVG 43mHg   Breast CA ([place]    left   Breast cancer ([facility] [place]    Carotid stenosis    1-39% left   Degenerative disc disease, lumbar    of the knees   Diabetes mellitus    Dysrhythmia    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[place]    Fatigue    Severe secondary to to gemfibrozil   Gastroesophageal reflux disease    denies   GI bleeding    a. 06/2018: EGD showed multiple nonbleeding duodenal ulcers. 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   Hammertoe    left second toe   Hypercholesteremia    Hypertension    Insomnia    Irritable bowel    Low back pain    Metabolic syndrome    Morbid obesity (HCC)    OSA on CPAP \   bipap   Osteoarthritis    Osteopenia    Persistent atrial fibrillation ([facility]    s/p afib ablation - Dr. ARayann Heman1/2020   Personal history of radiation therapy    Pneumonia    PONV (postoperative nausea and vomiting)    Sigmoid diverticulosis    Thalassemia minor    Vitamin D deficiency     Past Surgical History:  Procedure Laterality Date   ATRIAL FIBRILLATION ABLATION N/A 09/21/2018   Procedure: ATRIAL FIBRILLATION ABLATION;  Surgeon: AThompson Grayer MD;  [facility];  Service: Cardiovascular;  Laterality: N/A;   BIOPSY  07/06/2018    Procedure: BIOPSY;  Surgeon: MClarene Essex MD;  [facility];  Service: Endoscopy;;   BREAST LUMPECTOMY Left 05/28/2009   BREAST SURGERY Left 2010   lumpectomy   CARDIOVERSION N/A 07/07/2018   Procedure: CARDIOVERSION;  Surgeon: RSkeet Latch MD;  [facility];  Service: Cardiovascular;  Laterality: N/A;   CARDIOVERSION N/A 07/14/2018   Procedure: CARDIOVERSION;  Surgeon: MLarey Dresser MD;  [facility];  Service: Cardiovascular;  Laterality: N/A;   CESAREAN SECTION     COLONOSCOPY WITH PROPOFOL N/A 07/06/2018   Procedure: COLONOSCOPY WITH PROPOFOL;  Surgeon: MClarene Essex MD;  [place] [facility]  Service: Endoscopy;  Laterality: N/A;   CYSTECTOMY  1970   pilonidal    DILATION AND CURETTAGE OF UTERUS     ESOPHAGOGASTRODUODENOSCOPY (EGD) WITH PROPOFOL N/A 07/06/2018   Procedure: ESOPHAGOGASTRODUODENOSCOPY (EGD) WITH PROPOFOL;  Surgeon: MClarene Essex MD;  [place] [facility]  Service: Endoscopy;  Laterality: N/A;   EYE SURGERY Bilateral 07/2013   cataracts   HOT HEMOSTASIS N/A 07/06/2018   Procedure: HOT HEMOSTASIS (ARGON PLASMA COAGULATION/BICAP);  Surgeon: MClarene Essex MD;  [place]  Service: Endoscopy;  Laterality: N/A;  implantable loop recorder placement  01/15/2022   Medtronic Reveal Linq model [id number] implantable loop recorder (SN [id number]) implanted for AF management by Dr Rayann Heman   MAXIMUM ACCESS (MAS)POSTERIOR LUMBAR INTERBODY FUSION (PLIF) 2 LEVEL N/A 09/22/2013   Procedure: FOR MAXIMUM ACCESS  POSTERIOR LUMBAR INTERBODY FUSION LUMBAR THREE-FOUR FOUR-FIVE;  Surgeon: Eustace Moore, MD;  Location: [facility] NEURO ORS;  Service: Neurosurgery;  Laterality: N/A;   OOPHORECTOMY     wedge section not rem   REPLACEMENT TOTAL KNEE Left 2011   REPLACEMENT TOTAL KNEE     RIGHT/LEFT HEART CATH AND CORONARY ANGIOGRAPHY N/A 10/26/2020   Procedure: RIGHT/LEFT HEART CATH AND CORONARY ANGIOGRAPHY;  Surgeon: Leonie Man, MD;  Location: [place] CV LAB;  Service: Cardiovascular;  Laterality: N/A;   TEE WITHOUT CARDIOVERSION N/A 07/07/2018   Procedure: TRANSESOPHAGEAL ECHOCARDIOGRAM (TEE);  Surgeon: [name], MD;  Location: [place];  Service: Cardiovascular;  Laterality: N/A;   TEE WITHOUT CARDIOVERSION N/A 09/21/2018   Procedure: TRANSESOPHAGEAL ECHOCARDIOGRAM (TEE);  Surgeon: Acie Fredrickson Wonda Cheng, MD;  Location: [facility]COPY;  Service: Cardiovascular;  Laterality: N/A;   TONSILLECTOMY     TUBAL LIGATION      There were no vitals filed for this visit.   Subjective Assessment - 03/06/22 0930     Subjective talked alot about diet yesterday- starting wellness program Monday. alittle sore but oaky    Currently in Pain? Yes    Pain Score 4     Pain Location Knee    Pain Orientation Right                               [facility] Adult PT Treatment/Exercise - 03/06/22 0001       Lumbar Exercises: Aerobic   Nustep level 5 x 8 minutes      Lumbar Exercises: Machines for Strengthening   Cybex Lumbar Extension black tband flex and ext 20 x    Other Lumbar Machine Exercise lat pull and row 20# 2 sets 10,      Lumbar Exercises: Standing   Other Standing Lumbar Exercises standing hip ext, flex and abd 3# 2x10      Lumbar Exercises: Seated   Long Arc Quad on Chair Strengthening;Both;2 sets;10 reps;Weights    LAQ on Chair Weights (lbs) 3    Sit to Stand 10 reps   wt ball press   Other Seated Lumbar Exercises on dyna disc pelvic ROM 15 x 4 way, green tband trunk rotation 12x      Lumbar Exercises: Supine   Ab Set 15 reps;3 seconds   then lower abs with hip flex into ball 10 x each   Other Supine Lumbar Exercises feet on ball KTC, bridge and obl 15 x      Manual Therapy   Manual Therapy Passive ROM    Manual therapy comments pt struggling with cramping with ther ex    Passive ROM LE and trunk                       PT Short Term Goals - 11/13/21 2122       PT SHORT TERM GOAL  #1   Title independent with initial HEP    Status Achieved               PT Long Term Goals - 03/05/22 1415       PT LONG TERM GOAL #1   Title  report pain decreased 25%    Status Partially Met      PT LONG TERM GOAL #2   Title increase lumbar ROM 25%    Status Achieved      PT LONG TERM GOAL #3   Title report tolerance to standing at 10 minutes to cook a meal    Status Achieved                   Plan - 03/06/22 1012     Clinical Impression Statement folowed up and answered questions re: conversation yesterday about diet. progressed ex and did well minus some cramping. continued to educ throughout session on ex after D/C    PT Treatment/Interventions ADLs/Self Care Home Management;Cryotherapy;Electrical Stimulation;Moist Heat;Gait training;Stair training;Functional mobility training;Iontophoresis 32m/ml Dexamethasone;Therapeutic activities;Therapeutic exercise;Balance training;Neuromuscular re-education;Manual techniques;Patient/family education;Dry needling    PT Next Visit Plan next week will need to give her handout on the exercises and the weights so she can take that to the gym. D/C             Patient will benefit from skilled therapeutic intervention in order to improve the following deficits and impairments:  Abnormal gait, Decreased range of motion, Difficulty walking, Decreased mobility, Decreased strength, Postural dysfunction, Improper body mechanics, Impaired flexibility, Decreased balance, Pain, Decreased activity tolerance, Increased muscle spasms  Visit Diagnosis: Acute bilateral low back pain without sciatica  Difficulty in walking, not elsewhere classified  Acute pain of right knee     Problem List Patient Active Problem List   Diagnosis Date Noted   Shortness of breath    Unstable angina ([place] 10/23/2020   Secondary hypercoagulable state ([facility] 10/15/2020   Encounter for therapeutic drug monitoring 08/17/2018   CKD (chronic kidney  disease) stage 3, GFR 30-59 ml/min (HCC) 07/21/2018   Chronic anemia 07/21/2018   Anemia    Heme positive stool    Atrial fibrillation with RVR ([place] 07/04/2018   Carotid stenosis    Bruit of left carotid artery 09/22/2017   Aortic stenosis    Heart murmur 09/09/2016   Morbid obesity ([place] 02/05/2016   Dry eye 05/17/2015   Type 2 diabetes mellitus without complication ([place] 166/59/9357  Breast cancer, left breast ([place] 07/22/2014   Obesity, Class III, BMI 40-49.9 (morbid obesity) ([facility] 07/06/2014   Macular pseudohole 05/17/2014   History of surgical procedure 05/17/2014   OSA (obstructive sleep apnea) 11/17/2013   S/P lumbar spinal fusion 09/22/2013   Persistent atrial fibrillation ([place] 09/11/2011    Class: Acute   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[place] 09/11/2011    Class: Chronic   Essential hypertension 09/11/2011   Exogenous obesity 09/11/2011   Gastroesophageal reflux disease 09/11/2011   Thalassemia minor 09/11/2011    Class: Chronic   Breast cancer ([place] 09/11/2011    Class: Chronic   Degenerative joint disease 09/11/2011    Class: Chronic   Irritable bowel disease 09/11/2011    Class: Chronic   Lumbar disc disease 09/11/2011    Zaiya Annunziato,ANGIE, PTA 03/06/2022, 10:14 AM  [place] [address]Phone: [phone]  Fax:  [phone] Name: PLuciana CammarataMRN: [id number] of Birth: 212-01-1946

## 2022-03-10 DIAGNOSIS — Z1331 Encounter for screening for depression: Secondary | ICD-10-CM | POA: Diagnosis not present

## 2022-03-10 DIAGNOSIS — R0602 Shortness of breath: Secondary | ICD-10-CM | POA: Diagnosis not present

## 2022-03-10 DIAGNOSIS — D649 Anemia, unspecified: Secondary | ICD-10-CM | POA: Diagnosis not present

## 2022-03-10 DIAGNOSIS — Z79899 Other long term (current) drug therapy: Secondary | ICD-10-CM | POA: Diagnosis not present

## 2022-03-10 DIAGNOSIS — R5383 Other fatigue: Secondary | ICD-10-CM | POA: Diagnosis not present

## 2022-03-10 DIAGNOSIS — E118 Type 2 diabetes mellitus with unspecified complications: Secondary | ICD-10-CM | POA: Diagnosis not present

## 2022-03-10 DIAGNOSIS — R300 Dysuria: Secondary | ICD-10-CM | POA: Diagnosis not present

## 2022-03-10 DIAGNOSIS — R252 Cramp and spasm: Secondary | ICD-10-CM | POA: Diagnosis not present

## 2022-03-10 DIAGNOSIS — L299 Pruritus, unspecified: Secondary | ICD-10-CM | POA: Diagnosis not present

## 2022-03-11 NOTE — Progress Notes (Signed)
[facility] Summary Report / Loop Recorder 

## 2022-03-12 ENCOUNTER — Ambulatory Visit: Payer: Medicare PPO | Admitting: Physical Therapy

## 2022-03-12 ENCOUNTER — Encounter: Payer: Self-pay | Admitting: Physical Therapy

## 2022-03-12 DIAGNOSIS — M25561 Pain in right knee: Secondary | ICD-10-CM

## 2022-03-12 DIAGNOSIS — M545 Low back pain, unspecified: Secondary | ICD-10-CM

## 2022-03-12 DIAGNOSIS — R262 Difficulty in walking, not elsewhere classified: Secondary | ICD-10-CM | POA: Diagnosis not present

## 2022-03-12 DIAGNOSIS — M62830 Muscle spasm of back: Secondary | ICD-10-CM | POA: Diagnosis not present

## 2022-03-12 NOTE — Therapy (Signed)
[place] [place], [place], [address] Phone: [phone]   Fax:  [phone]  Physical Therapy Treatment  Patient Details  Name: Jenna Mcdonald MRN: [id number] Date of Birth: April 10, 1946 Referring Provider (PT): Eustace Moore   Encounter Date: 03/12/2022   PT End of Session - 03/12/22 0933     Visit Number 27    Date for PT Re-Evaluation 03/14/22    Authorization Type [employer]    PT Start Time 0927    Activity Tolerance Patient tolerated treatment well    Behavior During Therapy [facility] for tasks assessed/performed             Past Medical History:  Diagnosis Date   Anemia    Aortic stenosis    mild by echo 09/2020 with mean AVG 21mmHg   Breast CA ([place])    left   Breast cancer ([place])    Carotid stenosis    1-39% left   Degenerative disc disease, lumbar    of the knees   Diabetes mellitus    Dysrhythmia    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[place])    Fatigue    Severe secondary to to gemfibrozil   Gastroesophageal reflux disease    denies   GI bleeding    a. 06/2018: EGD showed multiple nonbleeding duodenal ulcers. 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   Hammertoe    left second toe   Hypercholesteremia    Hypertension    Insomnia    Irritable bowel    Low back pain    Metabolic syndrome    Morbid obesity (HCC)    OSA on CPAP \   bipap   Osteoarthritis    Osteopenia    Persistent atrial fibrillation [facility])    s/p afib ablation - Dr. Rayann Heman 08/2018   Personal history of radiation therapy    Pneumonia    PONV (postoperative nausea and vomiting)    Sigmoid diverticulosis    Thalassemia minor    Vitamin D deficiency     Past Surgical History:  Procedure Laterality Date   ATRIAL FIBRILLATION ABLATION N/A 09/21/2018   Procedure: ATRIAL FIBRILLATION ABLATION;  Surgeon: Thompson Grayer, MD;  Location: [place] CV LAB;  Service:  Cardiovascular;  Laterality: N/A;   BIOPSY  07/06/2018   Procedure: BIOPSY;  Surgeon: Clarene Essex, MD;  Location: [facility] ENDOSCOPY;  Service: Endoscopy;;   BREAST LUMPECTOMY Left 05/28/2009   BREAST SURGERY Left 2010   lumpectomy   CARDIOVERSION N/A 07/07/2018   Procedure: CARDIOVERSION;  Surgeon: [name], MD;  Location: [facility] ENDOSCOPY;  Service: Cardiovascular;  Laterality: N/A;   CARDIOVERSION N/A 07/14/2018   Procedure: CARDIOVERSION;  Surgeon: Larey Dresser, MD;  Location: [facility] ENDOSCOPY;  Service: Cardiovascular;  Laterality: N/A;   CESAREAN SECTION     COLONOSCOPY WITH PROPOFOL N/A 07/06/2018   Procedure: COLONOSCOPY WITH PROPOFOL;  Surgeon: Clarene Essex, MD;  Location: [place];  Service: Endoscopy;  Laterality: N/A;   CYSTECTOMY  1970   pilonidal    DILATION AND CURETTAGE OF UTERUS     ESOPHAGOGASTRODUODENOSCOPY (EGD) WITH PROPOFOL N/A 07/06/2018   Procedure: ESOPHAGOGASTRODUODENOSCOPY (EGD) WITH PROPOFOL;  Surgeon: Clarene Essex, MD;  Location: [place];  Service: Endoscopy;  Laterality: N/A;   EYE SURGERY Bilateral 07/2013   cataracts   HOT HEMOSTASIS N/A 07/06/2018   Procedure: HOT HEMOSTASIS (ARGON PLASMA COAGULATION/BICAP);  Surgeon: Clarene Essex, MD;  Location: [place];  Service: Endoscopy;  Laterality:  N/A;   implantable loop recorder placement  01/15/2022   Medtronic Reveal Linq model [id number] implantable loop recorder (SN [id number]) implanted for AF management by Dr Rayann Heman   MAXIMUM ACCESS (MAS)POSTERIOR LUMBAR INTERBODY FUSION (PLIF) 2 LEVEL N/A 09/22/2013   Procedure: FOR MAXIMUM ACCESS  POSTERIOR LUMBAR INTERBODY FUSION LUMBAR THREE-FOUR FOUR-FIVE;  Surgeon: Eustace Moore, MD;  Location: [place] NEURO ORS;  Service: Neurosurgery;  Laterality: N/A;   OOPHORECTOMY     wedge section not rem   REPLACEMENT TOTAL KNEE Left 2011   REPLACEMENT TOTAL KNEE     RIGHT/LEFT HEART CATH AND CORONARY ANGIOGRAPHY N/A 10/26/2020   Procedure: RIGHT/LEFT HEART CATH AND CORONARY  ANGIOGRAPHY;  Surgeon: Leonie Man, MD;  Location: [place] CV LAB;  Service: Cardiovascular;  Laterality: N/A;   TEE WITHOUT CARDIOVERSION N/A 07/07/2018   Procedure: TRANSESOPHAGEAL ECHOCARDIOGRAM (TEE);  Surgeon: [name], MD;  Location: [place];  Service: Cardiovascular;  Laterality: N/A;   TEE WITHOUT CARDIOVERSION N/A 09/21/2018   Procedure: TRANSESOPHAGEAL ECHOCARDIOGRAM (TEE);  Surgeon: Acie Fredrickson Wonda Cheng, MD;  Location: [facility] ENDOSCOPY;  Service: Cardiovascular;  Laterality: N/A;   TONSILLECTOMY     TUBAL LIGATION      There were no vitals filed for this visit.   Subjective Assessment - 03/12/22 0933     Subjective I saw the wellness doctor last week, I am very encouraged, no pain    Currently in Pain? No/denies                               [facility] Adult PT Treatment/Exercise - 03/12/22 0001       Lumbar Exercises: Aerobic   Nustep level 5 x 10 minutes      Lumbar Exercises: Machines for Strengthening   Cybex Knee Flexion 20# 2x15    Other Lumbar Machine Exercise lat pull and row 20# 2 sets 10,    Other Lumbar Machine Exercise bi's and tri's                       PT Short Term Goals - 11/13/21 2197       PT SHORT TERM GOAL #1   Title independent with initial HEP    Status Achieved               PT Long Term Goals - 03/12/22 1029       PT LONG TERM GOAL #1   Title report pain decreased 25%    Status Achieved      PT LONG TERM GOAL #2   Title increase lumbar ROM 25%    Status Achieved      PT LONG TERM GOAL #3   Title report tolerance to standing at 10 minutes to cook a meal    Status Achieved      PT LONG TERM GOAL #4   Title report that she can walk 10 minutes    Status Achieved      PT LONG TERM GOAL #5   Title increase LE strength to 4+/5    Status Achieved                   Plan - 03/12/22 1029     Clinical Impression Statement Went over all exercises gave her handouts and pix  of the gym exercises that we have done here and what I think is safe at the gym, advised against knee extension and leg  press due to knees, went over how to alter steps per minute and level on the nustep to increase cardio and calorie burn.  Went over forn and put hints on the handout and questions to ask the gym people, she reports that she feels that she can do it if she has someone there that helps    PT Next Visit Plan d/c with goals met    Consulted and Agree with Plan of Care Patient             Patient will benefit from skilled therapeutic intervention in order to improve the following deficits and impairments:  Abnormal gait, Decreased range of motion, Difficulty walking, Decreased mobility, Decreased strength, Postural dysfunction, Improper body mechanics, Impaired flexibility, Decreased balance, Pain, Decreased activity tolerance, Increased muscle spasms  Visit Diagnosis: Acute bilateral low back pain without sciatica  Difficulty in walking, not elsewhere classified  Acute pain of right knee  Muscle spasm of back     Problem List Patient Active Problem List   Diagnosis Date Noted   Shortness of breath    Unstable angina ([place]) 10/23/2020   Secondary hypercoagulable state ([place]) 10/15/2020   Encounter for therapeutic drug monitoring 08/17/2018   CKD (chronic kidney disease) stage 3, GFR 30-59 ml/min (HCC) 07/21/2018   Chronic anemia 07/21/2018   Anemia    Heme positive stool    Atrial fibrillation with RVR ([place]) 07/04/2018   Carotid stenosis    Bruit of left carotid artery 09/22/2017   Aortic stenosis    Heart murmur 09/09/2016   Morbid obesity ([place]) 02/05/2016   Dry eye 05/17/2015   Type 2 diabetes mellitus without complication ([place]) 03/47/4259   Breast cancer, left breast ([place]) 07/22/2014   Obesity, Class III, BMI 40-49.9 (morbid obesity) ([place]) 07/06/2014   Macular pseudohole 05/17/2014   History of surgical procedure 05/17/2014   OSA (obstructive sleep apnea)  11/17/2013   S/P lumbar spinal fusion 09/22/2013   Persistent atrial fibrillation ([place]) 09/11/2011    Class: Acute   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 [place], [place], [address] Phone: [phone]   Fax:  [phone]  Name: Jaysa Kise MRN: [id number] Date of Birth: Aug 27, 1945

## 2022-03-19 ENCOUNTER — Other Ambulatory Visit: Payer: Self-pay | Admitting: Internal Medicine

## 2022-03-19 DIAGNOSIS — Z0000 Encounter for general adult medical examination without abnormal findings: Secondary | ICD-10-CM | POA: Diagnosis not present

## 2022-03-19 DIAGNOSIS — I4891 Unspecified atrial fibrillation: Secondary | ICD-10-CM | POA: Diagnosis not present

## 2022-03-19 DIAGNOSIS — Z1331 Encounter for screening for depression: Secondary | ICD-10-CM | POA: Diagnosis not present

## 2022-03-19 DIAGNOSIS — G4733 Obstructive sleep apnea (adult) (pediatric): Secondary | ICD-10-CM | POA: Diagnosis not present

## 2022-03-19 DIAGNOSIS — I700 Atherosclerosis of aorta: Secondary | ICD-10-CM | POA: Diagnosis not present

## 2022-03-19 DIAGNOSIS — I5032 Chronic diastolic (congestive) heart failure: Secondary | ICD-10-CM | POA: Diagnosis not present

## 2022-03-19 DIAGNOSIS — M8589 Other specified disorders of bone density and structure, multiple sites: Secondary | ICD-10-CM | POA: Diagnosis not present

## 2022-03-19 DIAGNOSIS — N1831 Chronic kidney disease, stage 3a: Secondary | ICD-10-CM | POA: Diagnosis not present

## 2022-03-19 DIAGNOSIS — M8580 Other specified disorders of bone density and structure, unspecified site: Secondary | ICD-10-CM

## 2022-03-19 DIAGNOSIS — I10 Essential (primary) hypertension: Secondary | ICD-10-CM | POA: Diagnosis not present

## 2022-03-19 DIAGNOSIS — E559 Vitamin D deficiency, unspecified: Secondary | ICD-10-CM | POA: Diagnosis not present

## 2022-03-19 DIAGNOSIS — E1165 Type 2 diabetes mellitus with hyperglycemia: Secondary | ICD-10-CM | POA: Diagnosis not present

## 2022-03-19 DIAGNOSIS — D500 Iron deficiency anemia secondary to blood loss (chronic): Secondary | ICD-10-CM | POA: Diagnosis not present

## 2022-03-20 ENCOUNTER — Ambulatory Visit (INDEPENDENT_AMBULATORY_CARE_PROVIDER_SITE_OTHER): Payer: Medicare PPO

## 2022-03-20 ENCOUNTER — Other Ambulatory Visit: Payer: Self-pay | Admitting: Internal Medicine

## 2022-03-20 DIAGNOSIS — M8580 Other specified disorders of bone density and structure, unspecified site: Secondary | ICD-10-CM

## 2022-03-20 DIAGNOSIS — I4819 Other persistent atrial fibrillation: Secondary | ICD-10-CM

## 2022-03-21 ENCOUNTER — Telehealth: Payer: Self-pay

## 2022-03-21 NOTE — Telephone Encounter (Signed)
The patient called to get results of transmission.

## 2022-03-24 DIAGNOSIS — Z6841 Body Mass Index (BMI) 40.0 and over, adult: Secondary | ICD-10-CM | POA: Diagnosis not present

## 2022-03-24 DIAGNOSIS — D508 Other iron deficiency anemias: Secondary | ICD-10-CM | POA: Diagnosis not present

## 2022-03-24 DIAGNOSIS — E781 Pure hyperglyceridemia: Secondary | ICD-10-CM | POA: Diagnosis not present

## 2022-03-24 DIAGNOSIS — I10 Essential (primary) hypertension: Secondary | ICD-10-CM | POA: Diagnosis not present

## 2022-03-24 DIAGNOSIS — E65 Localized adiposity: Secondary | ICD-10-CM | POA: Diagnosis not present

## 2022-03-24 DIAGNOSIS — E1169 Type 2 diabetes mellitus with other specified complication: Secondary | ICD-10-CM | POA: Diagnosis not present

## 2022-03-24 LAB — CUP PACEART REMOTE DEVICE CHECK
Date Time Interrogation Session: 20230730204842
Implantable Pulse Generator Implant Date: 20230524

## 2022-03-29 ENCOUNTER — Other Ambulatory Visit: Payer: Self-pay | Admitting: Cardiology

## 2022-03-31 ENCOUNTER — Ambulatory Visit
Admission: RE | Admit: 2022-03-31 | Discharge: 2022-03-31 | Disposition: A | Discharge status: Home / Self Care | Payer: Medicare PPO | Source: Ambulatory Visit | Attending: Internal Medicine | Admitting: Internal Medicine

## 2022-03-31 DIAGNOSIS — Z780 Asymptomatic menopausal state: Secondary | ICD-10-CM | POA: Diagnosis not present

## 2022-03-31 DIAGNOSIS — M85852 Other specified disorders of bone density and structure, left thigh: Secondary | ICD-10-CM | POA: Diagnosis not present

## 2022-03-31 DIAGNOSIS — M8580 Other specified disorders of bone density and structure, unspecified site: Secondary | ICD-10-CM

## 2022-03-31 NOTE — Telephone Encounter (Signed)
Pt no longer on warfarin per Dr. Rayann Heman on 01/15/2022; will decline refill at this time.

## 2022-04-11 NOTE — Progress Notes (Signed)
[facility] Summary Report / Loop Recorder 

## 2022-04-25 ENCOUNTER — Ambulatory Visit (INDEPENDENT_AMBULATORY_CARE_PROVIDER_SITE_OTHER): Payer: Medicare PPO

## 2022-04-25 DIAGNOSIS — I4819 Other persistent atrial fibrillation: Secondary | ICD-10-CM

## 2022-04-29 LAB — CUP PACEART REMOTE DEVICE CHECK
Date Time Interrogation Session: 20230901204653
Implantable Pulse Generator Implant Date: 20230524

## 2022-05-01 ENCOUNTER — Other Ambulatory Visit: Payer: Self-pay | Admitting: Internal Medicine

## 2022-05-01 DIAGNOSIS — D2362 Other benign neoplasm of skin of left upper limb, including shoulder: Secondary | ICD-10-CM | POA: Diagnosis not present

## 2022-05-01 DIAGNOSIS — L918 Other hypertrophic disorders of the skin: Secondary | ICD-10-CM | POA: Diagnosis not present

## 2022-05-01 DIAGNOSIS — L218 Other seborrheic dermatitis: Secondary | ICD-10-CM | POA: Diagnosis not present

## 2022-05-01 DIAGNOSIS — D2262 Melanocytic nevi of left upper limb, including shoulder: Secondary | ICD-10-CM | POA: Diagnosis not present

## 2022-05-01 DIAGNOSIS — D1801 Hemangioma of skin and subcutaneous tissue: Secondary | ICD-10-CM | POA: Diagnosis not present

## 2022-05-01 DIAGNOSIS — L821 Other seborrheic keratosis: Secondary | ICD-10-CM | POA: Diagnosis not present

## 2022-05-01 DIAGNOSIS — L814 Other melanin hyperpigmentation: Secondary | ICD-10-CM | POA: Diagnosis not present

## 2022-05-01 DIAGNOSIS — L738 Other specified follicular disorders: Secondary | ICD-10-CM | POA: Diagnosis not present

## 2022-05-01 DIAGNOSIS — L820 Inflamed seborrheic keratosis: Secondary | ICD-10-CM | POA: Diagnosis not present

## 2022-05-01 DIAGNOSIS — D485 Neoplasm of uncertain behavior of skin: Secondary | ICD-10-CM | POA: Diagnosis not present

## 2022-05-05 DIAGNOSIS — E669 Obesity, unspecified: Secondary | ICD-10-CM | POA: Diagnosis not present

## 2022-05-05 DIAGNOSIS — E781 Pure hyperglyceridemia: Secondary | ICD-10-CM | POA: Diagnosis not present

## 2022-05-05 DIAGNOSIS — Z6839 Body mass index (BMI) 39.0-39.9, adult: Secondary | ICD-10-CM | POA: Diagnosis not present

## 2022-05-05 DIAGNOSIS — E1169 Type 2 diabetes mellitus with other specified complication: Secondary | ICD-10-CM | POA: Diagnosis not present

## 2022-05-05 DIAGNOSIS — I10 Essential (primary) hypertension: Secondary | ICD-10-CM | POA: Diagnosis not present

## 2022-05-05 DIAGNOSIS — E65 Localized adiposity: Secondary | ICD-10-CM | POA: Diagnosis not present

## 2022-05-14 NOTE — Progress Notes (Signed)
[facility] Summary Report / Loop Recorder 

## 2022-05-26 ENCOUNTER — Ambulatory Visit (INDEPENDENT_AMBULATORY_CARE_PROVIDER_SITE_OTHER): Payer: Medicare PPO

## 2022-05-26 DIAGNOSIS — I4819 Other persistent atrial fibrillation: Secondary | ICD-10-CM | POA: Diagnosis not present

## 2022-05-29 ENCOUNTER — Other Ambulatory Visit: Payer: Self-pay | Admitting: Internal Medicine

## 2022-05-29 DIAGNOSIS — Z1231 Encounter for screening mammogram for malignant neoplasm of breast: Secondary | ICD-10-CM

## 2022-05-29 LAB — CUP PACEART REMOTE DEVICE CHECK
Date Time Interrogation Session: 20231004204728
Implantable Pulse Generator Implant Date: 20230524

## 2022-06-02 ENCOUNTER — Telehealth: Payer: Self-pay

## 2022-06-02 NOTE — Telephone Encounter (Signed)
I spoke with patient and attempted to explain that her remote transmission shows normal device function. Advised that we only call if there are concerns on the report. Also offered for patient to call each month to review transmission with nurse. Patient stated she did not want to call each month. Explained to patient that I do not have access to her mychart, what she can see or what is released but advised patient I am happy to connect her to the correct person/persons. Patients name and phone number taken and explained to patient I would pass along to my supervisor for further assistance. Advised patient as soon as we know more information someone will follow up with her. Patient voiced understanding.

## 2022-06-02 NOTE — Telephone Encounter (Signed)
The patient called very upset because she can not read the report. I explained to her she can call us and the nurse will be more than happy to interpret the report. I explained to her that we do not have any control on what is put into her my chart. I let the patient speak with Leigh, rn.

## 2022-06-05 NOTE — Telephone Encounter (Signed)
Outreach made to Pt.  Loop report received in June via [place] released provided documentation on her device.  Her last 2 reports did not have that information released.  Will follow up.

## 2022-06-09 NOTE — Progress Notes (Signed)
[facility] Summary Report / Loop Recorder 

## 2022-06-16 NOTE — Telephone Encounter (Signed)
Left detailed message for Pt.  Advised loop reports should all be visible now via MyChart.  Requested Pt check and confirm at her leisure.    Left device clinic phone number to call back.

## 2022-06-16 NOTE — Telephone Encounter (Signed)
Call back received from Pt.  She is still not getting the loop summary reports via [employer].  She has checked.  Will need to follow up with Epic representative.

## 2022-06-17 DIAGNOSIS — M25561 Pain in right knee: Secondary | ICD-10-CM | POA: Diagnosis not present

## 2022-06-17 DIAGNOSIS — E65 Localized adiposity: Secondary | ICD-10-CM | POA: Diagnosis not present

## 2022-06-17 DIAGNOSIS — E1169 Type 2 diabetes mellitus with other specified complication: Secondary | ICD-10-CM | POA: Diagnosis not present

## 2022-06-17 DIAGNOSIS — K5909 Other constipation: Secondary | ICD-10-CM | POA: Diagnosis not present

## 2022-06-17 DIAGNOSIS — Z6841 Body Mass Index (BMI) 40.0 and over, adult: Secondary | ICD-10-CM | POA: Diagnosis not present

## 2022-06-17 DIAGNOSIS — G8929 Other chronic pain: Secondary | ICD-10-CM | POA: Diagnosis not present

## 2022-06-30 ENCOUNTER — Ambulatory Visit (INDEPENDENT_AMBULATORY_CARE_PROVIDER_SITE_OTHER): Payer: Medicare PPO

## 2022-06-30 DIAGNOSIS — I4819 Other persistent atrial fibrillation: Secondary | ICD-10-CM | POA: Diagnosis not present

## 2022-07-02 LAB — CUP PACEART REMOTE DEVICE CHECK
Date Time Interrogation Session: 20231106204706
Implantable Pulse Generator Implant Date: 20230524

## 2022-07-03 ENCOUNTER — Ambulatory Visit
Admission: RE | Admit: 2022-07-03 | Discharge: 2022-07-03 | Disposition: A | Discharge status: Home / Self Care | Payer: Medicare PPO | Source: Ambulatory Visit | Attending: Internal Medicine | Admitting: Internal Medicine

## 2022-07-03 DIAGNOSIS — Z1231 Encounter for screening mammogram for malignant neoplasm of breast: Secondary | ICD-10-CM

## 2022-07-14 ENCOUNTER — Ambulatory Visit (HOSPITAL_COMMUNITY)
Admission: RE | Admit: 2022-07-14 | Discharge: 2022-07-14 | Disposition: A | Discharge status: Home / Self Care | Payer: Medicare PPO | Source: Ambulatory Visit | Attending: Physician Assistant | Admitting: Physician Assistant

## 2022-07-14 VITALS — BP 136/60 | HR 64 | Ht 64.5 in | Wt 230.8 lb

## 2022-07-14 DIAGNOSIS — I5032 Chronic diastolic (congestive) heart failure: Secondary | ICD-10-CM | POA: Insufficient documentation

## 2022-07-14 DIAGNOSIS — D6869 Other thrombophilia: Secondary | ICD-10-CM | POA: Diagnosis not present

## 2022-07-14 DIAGNOSIS — I493 Ventricular premature depolarization: Secondary | ICD-10-CM | POA: Diagnosis not present

## 2022-07-14 DIAGNOSIS — Z79899 Other long term (current) drug therapy: Secondary | ICD-10-CM | POA: Insufficient documentation

## 2022-07-14 DIAGNOSIS — G4733 Obstructive sleep apnea (adult) (pediatric): Secondary | ICD-10-CM | POA: Insufficient documentation

## 2022-07-14 DIAGNOSIS — I4819 Other persistent atrial fibrillation: Secondary | ICD-10-CM | POA: Diagnosis not present

## 2022-07-14 DIAGNOSIS — Z87891 Personal history of nicotine dependence: Secondary | ICD-10-CM | POA: Insufficient documentation

## 2022-07-14 DIAGNOSIS — I110 Hypertensive heart disease with heart failure: Secondary | ICD-10-CM | POA: Insufficient documentation

## 2022-07-14 NOTE — Progress Notes (Signed)
Primary Care Physician: Josetta Huddle, MD Referring Physician: [facility] f/u EP: Dr. Myles Gip Cardiologist: Dr.Turner   Jenna Mcdonald is a 76 y.o. female with a h/o persistent afib that was  hospitalized 07/20/18  for failure of flecainide to maintain  SR. She was started on amiodarone. Dr. Rayann Heman consulted and thought pt may be an ablation candidate.  She is on warfarin.   I last saw pt  10/19/18. She was s/p ablation x one month. She reports that she had done exceptionally well. She had not noted any afib. She was having however, some GI issues that she thought was from amiodarone and stopped drug.   I am now seeing pt, 10/10/20, at the request of Dr. Radford Pax. She was having an echo yesterday and the echo tech noted irregular heart beat and reported she was in afib, but I feel it  was the premature beats , not afib. Ekg  today shows SR with frequent PVC's. She recently had a stress test and echo for reporting to Dr. Radford Pax that she is short of breath walking to her mailbox with pressure in her chest for the last last several weeks. PC's were noted at time of stress test as well.  She reports that she has gained around 20 lbs over the last year. Her BP is very elevated today. She checks very rarely;y at home. Her stress test did not show any ischemia but EF was reported moderately decreased. Dr. Radford Pax thought it was 2/2 to the PC's and wanted to see EF from echo, results  are pending from yesterday. She has OSA and is using CPAP.   Follow up in the AF clinic 10/15/20. Patient presented to ED on 10/12/20 with chest heaviness and dyspnea on exertion. ACS was ruled out but she did have evidence of vascular congestion on CXR. She refused Lasix and was discharged home. ECG showed SR with bigeminal PVCs. She continues to have dyspnea with exertion and orthopnea.   Follow up in the AF clinic 07/14/22. Patient reports that she has had pain on the left side of her chest and up into her shoulder where her  ILR is located. She states it started when the device was implanted. The ILR shows 0% afib burden. She is off anticoagulation. She as lost ~22 lbs over the past year.   Today, she denies symptoms of PND, dizziness, presyncope, syncope, or neurologic sequela. The patient is tolerating medications without difficulties and is otherwise without complaint today.   Past Medical History:  Diagnosis Date   Anemia    Aortic stenosis    mild by echo 09/2020 with mean AVG 60mHg   Breast CA ([place]    left   Breast cancer (HCC)    Carotid stenosis    1-39% left   Degenerative disc disease, lumbar    of the knees   Diabetes mellitus    Dysrhythmia    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HCC)    Fatigue    Severe secondary to to gemfibrozil   Gastroesophageal reflux disease    denies   GI bleeding    a. 06/2018: EGD showed multiple nonbleeding duodenal ulcers. 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   Hammertoe    left second toe   Hypercholesteremia    Hypertension    Insomnia    Irritable bowel    Low back pain    Metabolic syndrome    Morbid obesity (HCC)    OSA on CPAP \  bipap   Osteoarthritis    Osteopenia    Persistent atrial fibrillation [facility])    s/p afib ablation - Dr. Rayann Heman 08/2018   Personal history of radiation therapy    Pneumonia    PONV (postoperative nausea and vomiting)    Sigmoid diverticulosis    Thalassemia minor    Vitamin D deficiency    Past Surgical History:  Procedure Laterality Date   ATRIAL FIBRILLATION ABLATION N/A 09/21/2018   Procedure: ATRIAL FIBRILLATION ABLATION;  Surgeon: Thompson Grayer, MD;  Location: [place] CV LAB;  Service: Cardiovascular;  Laterality: N/A;   BIOPSY  07/06/2018   Procedure: BIOPSY;  Surgeon: Clarene Essex, MD;  Location: [facility] ENDOSCOPY;  Service: Endoscopy;;   BREAST LUMPECTOMY Left 05/28/2009   BREAST SURGERY Left 2010   lumpectomy   CARDIOVERSION N/A 07/07/2018    Procedure: CARDIOVERSION;  Surgeon: [name], MD;  Location: [facility] ENDOSCOPY;  Service: Cardiovascular;  Laterality: N/A;   CARDIOVERSION N/A 07/14/2018   Procedure: CARDIOVERSION;  Surgeon: Larey Dresser, MD;  Location: [facility] ENDOSCOPY;  Service: Cardiovascular;  Laterality: N/A;   CESAREAN SECTION     COLONOSCOPY WITH PROPOFOL N/A 07/06/2018   Procedure: COLONOSCOPY WITH PROPOFOL;  Surgeon: Clarene Essex, MD;  Location: [place];  Service: Endoscopy;  Laterality: N/A;   CYSTECTOMY  1970   pilonidal    DILATION AND CURETTAGE OF UTERUS     ESOPHAGOGASTRODUODENOSCOPY (EGD) WITH PROPOFOL N/A 07/06/2018   Procedure: ESOPHAGOGASTRODUODENOSCOPY (EGD) WITH PROPOFOL;  Surgeon: Clarene Essex, MD;  Location: [place];  Service: Endoscopy;  Laterality: N/A;   EYE SURGERY Bilateral 07/2013   cataracts   HOT HEMOSTASIS N/A 07/06/2018   Procedure: HOT HEMOSTASIS (ARGON PLASMA COAGULATION/BICAP);  Surgeon: Clarene Essex, MD;  Location: [place];  Service: Endoscopy;  Laterality: N/A;   implantable loop recorder placement  01/15/2022   Medtronic Reveal Linq model [id number] implantable loop recorder (SN [id number]) implanted for AF management by Dr Rayann Heman   MAXIMUM ACCESS (MAS)POSTERIOR LUMBAR INTERBODY FUSION (PLIF) 2 LEVEL N/A 09/22/2013   Procedure: FOR MAXIMUM ACCESS  POSTERIOR LUMBAR INTERBODY FUSION LUMBAR THREE-FOUR FOUR-FIVE;  Surgeon: Eustace Moore, MD;  Location: [place] NEURO ORS;  Service: Neurosurgery;  Laterality: N/A;   OOPHORECTOMY     wedge section not rem   REPLACEMENT TOTAL KNEE Left 2011   REPLACEMENT TOTAL KNEE     RIGHT/LEFT HEART CATH AND CORONARY ANGIOGRAPHY N/A 10/26/2020   Procedure: RIGHT/LEFT HEART CATH AND CORONARY ANGIOGRAPHY;  Surgeon: Leonie Man, MD;  Location: [place] CV LAB;  Service: Cardiovascular;  Laterality: N/A;   TEE WITHOUT CARDIOVERSION N/A 07/07/2018   Procedure: TRANSESOPHAGEAL ECHOCARDIOGRAM (TEE);  Surgeon: [name], MD;  Location: [place];  Service: Cardiovascular;  Laterality: N/A;   TEE WITHOUT CARDIOVERSION N/A 09/21/2018   Procedure: TRANSESOPHAGEAL ECHOCARDIOGRAM (TEE);  Surgeon: Acie Fredrickson Wonda Cheng, MD;  Location: [facility] ENDOSCOPY;  Service: Cardiovascular;  Laterality: N/A;   TONSILLECTOMY     TUBAL LIGATION      Current Outpatient Medications  Medication Sig Dispense Refill   betamethasone dipropionate 0.05 % lotion Apply topically as needed.     Calcium Carbonate-Vitamin D (CALCIUM 600 + D PO) Take 1 tablet by mouth daily.      Cholecalciferol (VITAMIN D3) 25 MCG (1000 UT) CAPS Take 1 capsule by mouth every morning.     diltiazem (CARDIZEM CD) 120 MG 24 hr capsule TAKE 1 CAPSULE BY MOUTH EVERY DAY 90 capsule 2   diphenhydramine-acetaminophen (TYLENOL PM) 25-500 MG TABS tablet Take  2 tablets by mouth as needed (sleep).      ketoconazole (NIZORAL) 2 % shampoo Apply 1 Application topically as needed.     ONETOUCH ULTRA test strip 1 each by Other route daily.     RYBELSUS 3 MG TABS Take 1 tablet by mouth every morning.     zolpidem (AMBIEN) 10 MG tablet Take 1 tablet by mouth as needed.     No current facility-administered medications for this encounter.    Allergies  Allergen Reactions   Metoprolol Shortness Of Breath, Diarrhea and Other (See Comments)    Fatigue    Amiodarone Other (See Comments)   Anastrozole Other (See Comments)    Other Reaction(s): Fatigue   Calcitriol Other (See Comments)   Furosemide     Other Reaction(s): cramps   Jardiance [Empagliflozin] Other (See Comments)    Severe muscle and bone pain   Tamoxifen Other (See Comments)    Bone and body aches    Tetracyclines & Related Other (See Comments)    Vaginal infections     Toprol Xl [Metoprolol Tartrate] Other (See Comments)    Hair loss   Verapamil Other (See Comments)    Unknown   Vicodin [Hydrocodone-Acetaminophen] Nausea And Vomiting and Other (See Comments)    Patient reports terrible nausea; plain tylenol is ok with  patient   Aromasin [Exemestane] Other (See Comments)    Hair loss    Gemfibrozil Other (See Comments)    Severe fatigue   Other Other (See Comments)    Numerous cancer drugs - unknown reaction    Social History   Socioeconomic History   Marital status: Widowed    Spouse name: Not on file   Number of children: Not on file   Years of education: Not on file   Highest education level: Not on file  Occupational History   Not on file  Tobacco Use   Smoking status: Former    Packs/day: 0.50    Years: 10.00    Total pack years: 5.00    Types: Cigarettes    Quit date: 11/12/2003    Years since quitting: 18.6   Smokeless tobacco: Never  Vaping Use   Vaping Use: Never used  Substance and Sexual Activity   Alcohol use: Yes    Comment: occasionally   Drug use: No   Sexual activity: Not Currently    Birth control/protection: Post-menopausal  Other Topics Concern   Not on file  Social History Narrative   Not on file   Social Determinants of Health   Financial Resource Strain: Not on file  Food Insecurity: Not on file  Transportation Needs: Not on file  Physical Activity: Not on file  Stress: Not on file  Social Connections: Not on file  Intimate Partner Violence: Not on file    Family History  Problem Relation Age of Onset   Anesthesia problems Mother    Diabetes Mother    Diabetes Father     ROS- All systems are reviewed and negative except as per the HPI above  Physical Exam: Vitals:   07/14/22 0944  BP: 136/60  Pulse: 64  Weight: 104.7 kg  Height: 5' 4.5" (1.638 m)    Wt Readings from Last 3 Encounters:  07/14/22 104.7 kg  01/15/22 114.8 kg  09/12/21 111.6 kg    Labs: Lab Results  Component Value Date   NA 139 07/29/2021   K 4.7 07/29/2021   CL 103 07/29/2021   CO2 23 07/29/2021   GLUCOSE  211 (H) 07/29/2021   BUN 23 07/29/2021   CREATININE 1.19 (H) 07/29/2021   CALCIUM 9.3 07/29/2021   MG 2.1 07/29/2021   Lab Results  Component Value Date    INR 3.1 (A) 12/26/2021   Lab Results  Component Value Date   CHOL 132 09/16/2021   HDL 46 09/16/2021   LDLCALC 52 09/16/2021   TRIG 210 (H) 09/16/2021    GEN- The patient is a well appearing elderly female, alert and oriented x 3 today.   HEENT-head normocephalic, atraumatic, sclera clear, conjunctiva pink, hearing intact, trachea midline. Lungs- Clear to ausculation bilaterally, normal work of breathing Heart- Regular rate and rhythm, no rubs or gallops, 1-0/2 systolic murmur  GI- soft, NT, ND, + BS Extremities- no clubbing, cyanosis, or edema MS- no significant deformity or atrophy Skin- no rash or lesion Psych- euthymic mood, full affect Neuro- strength and sensation are intact   EKG-  SR, NST Vent. rate 64 BPM PR interval 158 ms QRS duration 76 ms QT/QTcB 380/392 ms   Echo- 10/09/20  1. Left ventricular ejection fraction, by estimation, is 70 to 75%. The  left ventricle has hyperdynamic function. The left ventricle has no  regional wall motion abnormalities. There is mild left ventricular  hypertrophy. Left ventricular diastolic parameters are consistent with Grade II diastolic dysfunction (pseudonormalization).   2. Right ventricular systolic function is normal. The right ventricular  size is normal. There is normal pulmonary artery systolic pressure.   3. Left atrial size was moderately dilated.   4. The mitral valve is normal in structure. Trivial mitral valve  regurgitation. No evidence of mitral stenosis.   5. The aortic valve is grossly normal. Aortic valve regurgitation is not  visualized. Mild aortic valve stenosis.    CHA2DS2-VASc Score = 7  The patient's score is based upon: CHF History: 1 HTN History: 1 Diabetes History: 1 Stroke History: 0 Vascular Disease History: 1 Age Score: 2 Gender Score: 1       ASSESSMENT AND PLAN: 1. Persistent Atrial Fibrillation (ICD10:  I48.19) The patient's CHA2DS2-VASc score is 7, indicating a 11.2% annual risk  of stroke.   S/p ablation 09/21/18 ILR shows 0% afib burden.  She is off anticoagulation per her preference. She would consider resuming if ILR shows recurrent afib.  Continue diltiazem 120 mg daily  2. Secondary Hypercoagulable State (ICD10:  D68.69) The patient is at significant risk for stroke/thromboembolism based upon her CHA2DS2-VASc Score of 7.  However, the patient is not on anticoagulation due to her preference.   3. HTN Stable, no changes today.  4. Chronic HFpEF Fluid status appears stable today.  5. PVCs Frequent PVCs 22% burden on monitor Ablation not favored per Dr Rayann Heman Continue diltiazem    Follow up with Dr Radford Pax as scheduled. Follow up with Dr Myles Gip to establish care.    [facility] [address] [place], [place] [address] [phone]

## 2022-07-16 DIAGNOSIS — M1711 Unilateral primary osteoarthritis, right knee: Secondary | ICD-10-CM | POA: Diagnosis not present

## 2022-07-21 DIAGNOSIS — E1169 Type 2 diabetes mellitus with other specified complication: Secondary | ICD-10-CM | POA: Diagnosis not present

## 2022-07-21 DIAGNOSIS — Z6841 Body Mass Index (BMI) 40.0 and over, adult: Secondary | ICD-10-CM | POA: Diagnosis not present

## 2022-07-23 DIAGNOSIS — M1711 Unilateral primary osteoarthritis, right knee: Secondary | ICD-10-CM | POA: Diagnosis not present

## 2022-07-31 DIAGNOSIS — M1711 Unilateral primary osteoarthritis, right knee: Secondary | ICD-10-CM | POA: Diagnosis not present

## 2022-08-04 ENCOUNTER — Ambulatory Visit (INDEPENDENT_AMBULATORY_CARE_PROVIDER_SITE_OTHER): Payer: Medicare PPO

## 2022-08-04 DIAGNOSIS — I4819 Other persistent atrial fibrillation: Secondary | ICD-10-CM | POA: Diagnosis not present

## 2022-08-05 LAB — CUP PACEART REMOTE DEVICE CHECK
Date Time Interrogation Session: 20231210231323
Implantable Pulse Generator Implant Date: 20230524

## 2022-08-06 NOTE — Progress Notes (Signed)
[facility] Summary Report / Loop Recorder 

## 2022-08-15 DIAGNOSIS — Z961 Presence of intraocular lens: Secondary | ICD-10-CM | POA: Diagnosis not present

## 2022-08-15 DIAGNOSIS — H524 Presbyopia: Secondary | ICD-10-CM | POA: Diagnosis not present

## 2022-08-15 DIAGNOSIS — E119 Type 2 diabetes mellitus without complications: Secondary | ICD-10-CM | POA: Diagnosis not present

## 2022-08-15 DIAGNOSIS — H43813 Vitreous degeneration, bilateral: Secondary | ICD-10-CM | POA: Diagnosis not present

## 2022-08-15 DIAGNOSIS — H04123 Dry eye syndrome of bilateral lacrimal glands: Secondary | ICD-10-CM | POA: Diagnosis not present

## 2022-08-15 DIAGNOSIS — H35342 Macular cyst, hole, or pseudohole, left eye: Secondary | ICD-10-CM | POA: Diagnosis not present

## 2022-08-15 DIAGNOSIS — H35373 Puckering of macula, bilateral: Secondary | ICD-10-CM | POA: Diagnosis not present

## 2022-08-20 ENCOUNTER — Other Ambulatory Visit: Payer: Self-pay | Admitting: Internal Medicine

## 2022-08-20 DIAGNOSIS — K869 Disease of pancreas, unspecified: Secondary | ICD-10-CM

## 2022-09-03 DIAGNOSIS — R252 Cramp and spasm: Secondary | ICD-10-CM | POA: Diagnosis not present

## 2022-09-03 DIAGNOSIS — E65 Localized adiposity: Secondary | ICD-10-CM | POA: Diagnosis not present

## 2022-09-03 DIAGNOSIS — Z6841 Body Mass Index (BMI) 40.0 and over, adult: Secondary | ICD-10-CM | POA: Diagnosis not present

## 2022-09-03 DIAGNOSIS — E1165 Type 2 diabetes mellitus with hyperglycemia: Secondary | ICD-10-CM | POA: Diagnosis not present

## 2022-09-03 DIAGNOSIS — D500 Iron deficiency anemia secondary to blood loss (chronic): Secondary | ICD-10-CM | POA: Diagnosis not present

## 2022-09-04 ENCOUNTER — Other Ambulatory Visit: Payer: Medicare PPO

## 2022-09-04 ENCOUNTER — Encounter: Payer: Self-pay | Admitting: Cardiovascular Disease

## 2022-09-04 ENCOUNTER — Ambulatory Visit: Payer: Medicare PPO | Attending: Cardiovascular Disease | Admitting: Cardiovascular Disease

## 2022-09-04 VITALS — BP 134/72 | HR 84 | Ht 64.5 in | Wt 229.0 lb

## 2022-09-04 DIAGNOSIS — I4891 Unspecified atrial fibrillation: Secondary | ICD-10-CM

## 2022-09-04 DIAGNOSIS — G4733 Obstructive sleep apnea (adult) (pediatric): Secondary | ICD-10-CM | POA: Diagnosis not present

## 2022-09-04 NOTE — Progress Notes (Signed)
Electrophysiology Office Note:    Date:  09/04/2022   ID:  Jenna Mcdonald, DOB 02-18-1946, MRN [id number]  PCP:  Josetta Huddle, MD   [place] [facility] Cardiologist:  Fransico Him, MD Electrophysiologist:  Melida Quitter, MD     Referring MD: Josetta Huddle, MD   History of Present Illness:    Jenna Mcdonald is a 77 y.o. female with a hx listed below, significant for persistent atrial fibrillation, referred for arrhythmia management.  She has a long history of AF, maintained with flecainide in the past.  She was hospitalized with persistent atrial fibrillation in 2019 after failure of flecainide.  She started on amiodarone.  She underwent ablation in 2020.  Amiodarone was stopped due to GI distress.  In 2022 she was noted to have an irregular heart rhythm, but this appeared to be due to PVCs.  Later in 2022, she had admission for chest heaviness and was noted to have signs of heart failure with ventricular bigeminy on telemetry.  Medtronic Linq 2 loop recorder was placed in May 2023.  It has not shown any atrial fibrillation, so anticoagulation was discontinued.  Her PVC burden has been low.  She has complained of some left arm soreness since the device was implanted.   Past Medical History:  Diagnosis Date   Anemia    Aortic stenosis    mild by echo 09/2020 with mean AVG 36mHg   [place]    [place]   Breast cancer (HCC)    Carotid stenosis    1-39% left   Degenerative disc disease, lumbar    of the knees   Diabetes mellitus    Dysrhythmia    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HCC)    Fatigue    Severe secondary to to gemfibrozil   Gastroesophageal reflux disease    denies   GI bleeding    a. 06/2018: EGD showed multiple nonbleeding duodenal ulcers. 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   Hammertoe    left second toe   Hypercholesteremia    Hypertension    Insomnia    Irritable  bowel    Low back pain    Metabolic syndrome    Morbid obesity (HCC)    OSA on CPAP \   bipap   Osteoarthritis    Osteopenia    Persistent atrial fibrillation ([facility]    s/p afib ablation - Dr. ARayann Heman1/2020   Personal history of radiation therapy    Pneumonia    PONV (postoperative nausea and vomiting)    Sigmoid diverticulosis    Thalassemia minor    Vitamin D deficiency     Past Surgical History:  Procedure Laterality Date   ATRIAL FIBRILLATION ABLATION N/A 09/21/2018   Procedure: ATRIAL FIBRILLATION ABLATION;  Surgeon: AThompson Grayer MD;  [place];  Service: Cardiovascular;  Laterality: N/A;   BIOPSY  07/06/2018   Procedure: BIOPSY;  Surgeon: MClarene Essex MD;  [facility];  Service: Endoscopy;;   BREAST LUMPECTOMY Left 05/28/2009   BREAST SURGERY Left 2010   lumpectomy   CARDIOVERSION N/A 07/07/2018   Procedure: CARDIOVERSION;  Surgeon: RSkeet Latch MD;  [facility];  Service: Cardiovascular;  Laterality: N/A;   CARDIOVERSION N/A 07/14/2018   Procedure: CARDIOVERSION;  Surgeon: MLarey Dresser MD;  [facility];  Service: Cardiovascular;  Laterality: N/A;   CESAREAN SECTION     COLONOSCOPY WITH PROPOFOL N/A 07/06/2018   Procedure: COLONOSCOPY WITH  PROPOFOL;  Surgeon: Clarene Essex, MD;  Location: [place];  Service: Endoscopy;  Laterality: N/A;   CYSTECTOMY  1970   pilonidal    DILATION AND CURETTAGE OF UTERUS     ESOPHAGOGASTRODUODENOSCOPY (EGD) WITH PROPOFOL N/A 07/06/2018   Procedure: ESOPHAGOGASTRODUODENOSCOPY (EGD) WITH PROPOFOL;  Surgeon: Clarene Essex, MD;  Location: [place];  Service: Endoscopy;  Laterality: N/A;   EYE SURGERY Bilateral 07/2013   cataracts   HOT HEMOSTASIS N/A 07/06/2018   Procedure: HOT HEMOSTASIS (ARGON PLASMA COAGULATION/BICAP);  Surgeon: Clarene Essex, MD;  Location: [place];  Service: Endoscopy;  Laterality: N/A;   implantable loop recorder placement  01/15/2022   Medtronic  Reveal Linq model [id number] implantable loop recorder (SN [id number]) implanted for AF management by Dr Rayann Heman   MAXIMUM ACCESS (MAS)POSTERIOR LUMBAR INTERBODY FUSION (PLIF) 2 LEVEL N/A 09/22/2013   Procedure: FOR MAXIMUM ACCESS  POSTERIOR LUMBAR INTERBODY FUSION LUMBAR THREE-FOUR FOUR-FIVE;  Surgeon: Eustace Moore, MD;  Location: [place] NEURO ORS;  Service: Neurosurgery;  Laterality: N/A;   OOPHORECTOMY     wedge section not rem   REPLACEMENT TOTAL KNEE Left 2011   REPLACEMENT TOTAL KNEE     RIGHT/LEFT HEART CATH AND CORONARY ANGIOGRAPHY N/A 10/26/2020   Procedure: RIGHT/LEFT HEART CATH AND CORONARY ANGIOGRAPHY;  Surgeon: Leonie Man, MD;  Location: [place] CV LAB;  Service: Cardiovascular;  Laterality: N/A;   TEE WITHOUT CARDIOVERSION N/A 07/07/2018   Procedure: TRANSESOPHAGEAL ECHOCARDIOGRAM (TEE);  Surgeon: [name], MD;  Location: [place];  Service: Cardiovascular;  Laterality: N/A;   TEE WITHOUT CARDIOVERSION N/A 09/21/2018   Procedure: TRANSESOPHAGEAL ECHOCARDIOGRAM (TEE);  Surgeon: Acie Fredrickson Wonda Cheng, MD;  [facility];  Service: Cardiovascular;  Laterality: N/A;   TONSILLECTOMY     TUBAL LIGATION      Current Medications: Current Meds  Medication Sig   betamethasone dipropionate 0.05 % lotion Apply topically as needed.   Calcium Carbonate-Vitamin D (CALCIUM 600 + D PO) Take 1 tablet by mouth daily.    Cholecalciferol (VITAMIN D3) 25 MCG (1000 UT) CAPS Take 1 capsule by mouth every morning.   diltiazem (CARDIZEM CD) 120 MG 24 hr capsule TAKE 1 CAPSULE BY MOUTH EVERY DAY   diphenhydramine-acetaminophen (TYLENOL PM) 25-500 MG TABS tablet Take 2 tablets by mouth as needed (sleep).    ketoconazole (NIZORAL) 2 % shampoo Apply 1 Application topically as needed.   Multiple Vitamin (MULTIVITAMIN) tablet Take 1 tablet by mouth daily.   ONETOUCH ULTRA test strip 1 each by Other route daily.   TRULICITY 4.00 QQ/7.6PP SOPN Inject 1.5 mg into the skin once a week.    zolpidem (AMBIEN) 10 MG tablet Take 1 tablet by mouth as needed.   [DISCONTINUED] RYBELSUS 3 MG TABS Take 1 tablet by mouth every morning.     Allergies:   Metoprolol, Amiodarone, Anastrozole, Calcitriol, Furosemide, Jardiance [empagliflozin], Tamoxifen, Tetracyclines & related, Toprol xl [metoprolol tartrate], Verapamil, Vicodin [hydrocodone-acetaminophen], Aromasin [exemestane], Gemfibrozil, and Other   Social History   Socioeconomic History   Marital status: Widowed    Spouse name: Not on file   Number of children: Not on file   Years of education: Not on file   Highest education level: Not on file  Occupational History   Not on file  Tobacco Use   Smoking status: Former    Packs/day: 0.50    Years: 10.00    Total pack years: 5.00    Types: Cigarettes    Quit date: 11/12/2003    Years since quitting:  18.8   Smokeless tobacco: Never  Vaping Use   Vaping Use: Never used  Substance and Sexual Activity   Alcohol use: Yes    Comment: occasionally   Drug use: No   Sexual activity: Not Currently    Birth control/protection: Post-menopausal  Other Topics Concern   Not on file  Social History Narrative   Not on file   Social Determinants of Health   Financial Resource Strain: Not on file  Food Insecurity: Not on file  Transportation Needs: Not on file  Physical Activity: Not on file  Stress: Not on file  Social Connections: Not on file     Family History: The patient's family history includes Anesthesia problems in her mother; Diabetes in her father and mother.  ROS:   Please see the history of present illness.    All other systems reviewed and are negative.  EKGs/Labs/Other Studies Reviewed Today:     Recent Labs: 09/16/2021: ALT 15; Hemoglobin 10.8; Platelets 223     Physical Exam:    VS:  BP 134/72   Pulse 84   Ht 5' 4.5" (1.638 m)   Wt 229 lb (103.9 kg)   SpO2 98%   BMI 38.70 kg/m     Wt Readings from Last 3 Encounters:  09/04/22 229 lb (103.9 kg)   07/14/22 230 lb 12.8 oz (104.7 kg)  01/15/22 253 lb (114.8 kg)     GEN: Well nourished, well developed in no acute distress CARDIAC: RRR, no murmurs, rubs, gallops The device site is normal -- mild tenderness, edema, drainage, redness, threatened erosion. RESPIRATORY:  Normal work of breathing MUSCULOSKELETAL:  edema    ASSESSMENT & PLAN:    History of atrial fibrillation: s/p ablation. Maintaining sinus. She is off anticoagulation per patient's strong preference, and maintaining sinus rhythm monitored with loop recorder PVCs: burden monitored with loop recorder Loop recorder in place: Medtronic Linq II functioning normally        Medication Adjustments/Labs and Tests Ordered: Current medicines are reviewed at length with the patient today.  Concerns regarding medicines are outlined above.  No orders of the defined types were placed in this encounter.  No orders of the defined types were placed in this encounter.    Signed, Melida Quitter, MD  09/04/2022 11:25 AM    [place]

## 2022-09-04 NOTE — Patient Instructions (Signed)
Medication Instructions:  Your physician recommends that you continue on your current medications as directed. Please refer to the Current Medication list given to you today.  *If you need a refill on your cardiac medications before your next appointment, please call your pharmacy*  Lab Work: NONE  Testing/Procedures: NONE  Follow-Up: As needed with Dr. Myles Gip

## 2022-09-07 ENCOUNTER — Ambulatory Visit
Admission: RE | Admit: 2022-09-07 | Discharge: 2022-09-07 | Disposition: A | Discharge status: Home / Self Care | Payer: Medicare PPO | Source: Ambulatory Visit | Attending: Internal Medicine | Admitting: Internal Medicine

## 2022-09-07 DIAGNOSIS — K869 Disease of pancreas, unspecified: Secondary | ICD-10-CM

## 2022-09-07 MED ORDER — GADOPICLENOL 0.5 MMOL/ML IV SOLN
10.0000 mL | Freq: Once | INTRAVENOUS | Status: AC | PRN
  Administered 2022-09-07: 10 mL via INTRAVENOUS

## 2022-09-08 ENCOUNTER — Ambulatory Visit (INDEPENDENT_AMBULATORY_CARE_PROVIDER_SITE_OTHER): Payer: Medicare PPO

## 2022-09-08 DIAGNOSIS — I4891 Unspecified atrial fibrillation: Secondary | ICD-10-CM | POA: Diagnosis not present

## 2022-09-10 LAB — CUP PACEART REMOTE DEVICE CHECK
Date Time Interrogation Session: 20240112231217
Implantable Pulse Generator Implant Date: 20230524

## 2022-09-11 DIAGNOSIS — Z8679 Personal history of other diseases of the circulatory system: Secondary | ICD-10-CM | POA: Diagnosis not present

## 2022-09-11 DIAGNOSIS — E1122 Type 2 diabetes mellitus with diabetic chronic kidney disease: Secondary | ICD-10-CM | POA: Diagnosis not present

## 2022-09-11 DIAGNOSIS — I10 Essential (primary) hypertension: Secondary | ICD-10-CM | POA: Diagnosis not present

## 2022-09-11 DIAGNOSIS — I5032 Chronic diastolic (congestive) heart failure: Secondary | ICD-10-CM | POA: Diagnosis not present

## 2022-09-11 DIAGNOSIS — G729 Myopathy, unspecified: Secondary | ICD-10-CM | POA: Diagnosis not present

## 2022-09-11 DIAGNOSIS — I700 Atherosclerosis of aorta: Secondary | ICD-10-CM | POA: Diagnosis not present

## 2022-09-11 DIAGNOSIS — N1831 Chronic kidney disease, stage 3a: Secondary | ICD-10-CM | POA: Diagnosis not present

## 2022-09-11 DIAGNOSIS — G4733 Obstructive sleep apnea (adult) (pediatric): Secondary | ICD-10-CM | POA: Diagnosis not present

## 2022-09-11 NOTE — Progress Notes (Signed)
[facility] Summary Report / Loop Recorder

## 2022-09-22 ENCOUNTER — Encounter: Payer: Self-pay | Admitting: Cardiology

## 2022-09-22 ENCOUNTER — Ambulatory Visit: Payer: Medicare PPO | Attending: Cardiology | Admitting: Cardiology

## 2022-09-22 ENCOUNTER — Telehealth: Payer: Self-pay | Admitting: *Deleted

## 2022-09-22 VITALS — BP 140/60 | HR 89 | Ht 64.5 in | Wt 218.6 lb

## 2022-09-22 DIAGNOSIS — E7800 Pure hypercholesterolemia, unspecified: Secondary | ICD-10-CM

## 2022-09-22 DIAGNOSIS — I5032 Chronic diastolic (congestive) heart failure: Secondary | ICD-10-CM | POA: Diagnosis not present

## 2022-09-22 DIAGNOSIS — I10 Essential (primary) hypertension: Secondary | ICD-10-CM | POA: Diagnosis not present

## 2022-09-22 DIAGNOSIS — G4733 Obstructive sleep apnea (adult) (pediatric): Secondary | ICD-10-CM | POA: Diagnosis not present

## 2022-09-22 DIAGNOSIS — I480 Paroxysmal atrial fibrillation: Secondary | ICD-10-CM

## 2022-09-22 DIAGNOSIS — I350 Nonrheumatic aortic (valve) stenosis: Secondary | ICD-10-CM

## 2022-09-22 DIAGNOSIS — R0609 Other forms of dyspnea: Secondary | ICD-10-CM

## 2022-09-22 DIAGNOSIS — Z87898 Personal history of other specified conditions: Secondary | ICD-10-CM | POA: Diagnosis not present

## 2022-09-22 DIAGNOSIS — I493 Ventricular premature depolarization: Secondary | ICD-10-CM | POA: Diagnosis not present

## 2022-09-22 NOTE — Telephone Encounter (Signed)
Prior Authorization for [facility] sent to [facility] via Phone. Reference # .  READY- NO PA REQ

## 2022-09-22 NOTE — Addendum Note (Signed)
Addended by: Joni Reining on: 09/22/2022 09:11 AM   Modules accepted: Orders

## 2022-09-22 NOTE — Progress Notes (Signed)
Cardiology Office Note:    Date:  09/22/2022   ID:  Jenna Mcdonald, DOB [birth year], MRN [id number]  PCP:  Charlane Ferretti, MD  Cardiologist:  Fransico Him, MD    Referring MD: Josetta Huddle, MD   Chief Complaint  Patient presents with   Aortic Stenosis   Hypertension   Atrial Fibrillation   Congestive Heart Failure   Hyperlipidemia   Sleep Apnea    History of Present Illness:    Jenna Mcdonald is a 77 y.o. female with a hx of OSA on BiPAP, HTN, PAF s/p afib ablation 2020, mild AS by echo 08/2016 and morbid obesity.  2D echo 08/2018 showed normal LVF with mild AS and mild MR.   Leane Call 10/04/2020 for DOE showed no ischemia and 2D echo showed normal heart function with diastolic dysfunction and moderate LAE with mild AS and stable from prior echo.   She was seen in afib clinic 10/10/2020 and was noted to have frequent PVCs which were noted at time of stress test and her 2D echo.  She also reported that she had gained over 20lbs in the past year.  She then presented to the ER 10/12/20 with complaints of chest pain and DOE and ruled out with normal hsTrop but did have vascular congestion on CXR but refused Lasix and was discharged home.  She was having bigeminal PVCs at that time. A Zio Patch was ordered to assess PVC load.  She was seen back in afib clinic on 10/15/20 and was complaining of abdominal bloating and was started on Lasix '40mg'$  daily for 3 days and then taper down to '20mg'$  daily.   She underwent right and left heart cath ffor CP and SOB on 10/26/20 showing normal coronary arteries with elevated right heart pressures and PHTN which was felt to be due to CHF, OSA and obesity.  She was having frequent PVCs which were felt to be contributing to her volume overload and was started on Lasix '40mg'$  daily.    Due to a PVC load of 22% on heart monitor, she was seen by Dr. Rayann Heman and PVCs felt to originate from the outflow tract and ablation not favored. She had not  tolerated BB or Verapamil in the past and Dr. Rayann Heman said there was nothing else to offer. She is now followed by Dr. Myles Gip and has an ILR in place.  She is here today for followup and is doing well.  She denies any chest pain or pressure, SOB, DOE, PND, orthopnea, LE edema, dizziness, palpitations or syncope. She is compliant with her meds and is tolerating meds with no SE.    She is doing well with her PAP device.  She tolerates the mask and feels the pressure is adequate.  Since going on PAP she feels rested in the am and has no significant daytime sleepiness.  She denies any significant mouth or nasal dryness or nasal congestion.  She does not think that she snores.  She would like to see if she still has sleep apnea as she would like to get off PAP therapy.   Past Medical History:  Diagnosis Date   Anemia    Aortic stenosis    mild by echo 09/2020 with mean AVG 4mHg   Breast CA ([place]    left   Breast cancer (HCC)    Carotid stenosis    1-39% left   Degenerative disc disease, lumbar    of the knees   Diabetes mellitus  Dysrhythmia    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HCC)    Fatigue    Severe secondary to to gemfibrozil   Gastroesophageal reflux disease    denies   GI bleeding    a. 06/2018: EGD showed multiple nonbleeding duodenal ulcers. 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   Hammertoe    left second toe   Hypercholesteremia    Hypertension    Insomnia    Irritable bowel    Low back pain    Metabolic syndrome    Morbid obesity (HCC)    OSA on CPAP \   bipap   Osteoarthritis    Osteopenia    Persistent atrial fibrillation [facility])    s/p afib ablation - Dr. Rayann Heman 08/2018   Personal history of radiation therapy    Pneumonia    PONV (postoperative nausea and vomiting)    Sigmoid diverticulosis    Thalassemia minor    Vitamin D deficiency     Past Surgical History:  Procedure Laterality Date   ATRIAL  FIBRILLATION ABLATION N/A 09/21/2018   Procedure: ATRIAL FIBRILLATION ABLATION;  Surgeon: Thompson Grayer, MD;  Location: [place] CV LAB;  Service: Cardiovascular;  Laterality: N/A;   BIOPSY  07/06/2018   Procedure: BIOPSY;  Surgeon: Clarene Essex, MD;  [facility];  Service: Endoscopy;;   BREAST LUMPECTOMY Left 05/28/2009   BREAST SURGERY Left 2010   lumpectomy   CARDIOVERSION N/A 07/07/2018   Procedure: CARDIOVERSION;  Surgeon: [name], MD;  [facility]COPY;  Service: Cardiovascular;  Laterality: N/A;   CARDIOVERSION N/A 07/14/2018   Procedure: CARDIOVERSION;  Surgeon: Larey Dresser, MD;  Location: [facility] ENDOSCOPY;  Service: Cardiovascular;  Laterality: N/A;   CESAREAN SECTION     COLONOSCOPY WITH PROPOFOL N/A 07/06/2018   Procedure: COLONOSCOPY WITH PROPOFOL;  Surgeon: Clarene Essex, MD;  Location: [place];  Service: Endoscopy;  Laterality: N/A;   CYSTECTOMY  1970   pilonidal    DILATION AND CURETTAGE OF UTERUS     ESOPHAGOGASTRODUODENOSCOPY (EGD) WITH PROPOFOL N/A 07/06/2018   Procedure: ESOPHAGOGASTRODUODENOSCOPY (EGD) WITH PROPOFOL;  Surgeon: Clarene Essex, MD;  Location: [place];  Service: Endoscopy;  Laterality: N/A;   EYE SURGERY Bilateral 07/2013   cataracts   HOT HEMOSTASIS N/A 07/06/2018   Procedure: HOT HEMOSTASIS (ARGON PLASMA COAGULATION/BICAP);  Surgeon: Clarene Essex, MD;  Location: [place];  Service: Endoscopy;  Laterality: N/A;   implantable loop recorder placement  01/15/2022   Medtronic Reveal Linq model [id number] implantable loop recorder (SN [id number]) implanted for AF management by Dr Rayann Heman   MAXIMUM ACCESS (MAS)POSTERIOR LUMBAR INTERBODY FUSION (PLIF) 2 LEVEL N/A 09/22/2013   Procedure: FOR MAXIMUM ACCESS  POSTERIOR LUMBAR INTERBODY FUSION LUMBAR THREE-FOUR FOUR-FIVE;  Surgeon: Eustace Moore, MD;  Location: [place] NEURO ORS;  Service: Neurosurgery;  Laterality: N/A;   OOPHORECTOMY     wedge section not rem   REPLACEMENT TOTAL KNEE  Left 2011   REPLACEMENT TOTAL KNEE     RIGHT/LEFT HEART CATH AND CORONARY ANGIOGRAPHY N/A 10/26/2020   Procedure: RIGHT/LEFT HEART CATH AND CORONARY ANGIOGRAPHY;  Surgeon: Leonie Man, MD;  Location: [place] CV LAB;  Service: Cardiovascular;  Laterality: N/A;   TEE WITHOUT CARDIOVERSION N/A 07/07/2018   Procedure: TRANSESOPHAGEAL ECHOCARDIOGRAM (TEE);  Surgeon: [name], MD;  Location: [place];  Service: Cardiovascular;  Laterality: N/A;   TEE WITHOUT CARDIOVERSION N/A 09/21/2018   Procedure: TRANSESOPHAGEAL ECHOCARDIOGRAM (TEE);  Surgeon: Thayer Headings, MD;  [facility] ENDOSCOPY;  Service: Cardiovascular;  Laterality: N/A;   TONSILLECTOMY     TUBAL LIGATION      Current Medications: Current Meds  Medication Sig   betamethasone dipropionate 0.05 % lotion Apply topically as needed.   Calcium Carbonate-Vitamin D (CALCIUM 600 + D PO) Take 1 tablet by mouth daily.    Cholecalciferol (VITAMIN D3) 25 MCG (1000 UT) CAPS Take 1 capsule by mouth every morning.   diltiazem (CARDIZEM CD) 120 MG 24 hr capsule TAKE 1 CAPSULE BY MOUTH EVERY DAY   diphenhydramine-acetaminophen (TYLENOL PM) 25-500 MG TABS tablet Take 2 tablets by mouth as needed (sleep).    ketoconazole (NIZORAL) 2 % shampoo Apply 1 Application topically as needed.   Multiple Vitamin (MULTIVITAMIN) tablet Take 1 tablet by mouth daily.   ONETOUCH ULTRA test strip 1 each by Other route daily.   TRULICITY 1.5 ZR/0.0TM SOPN Inject 1.5 mg into the skin once a week.   zolpidem (AMBIEN) 10 MG tablet Take 1 tablet by mouth as needed.   [DISCONTINUED] TRULICITY 2.26 JF/3.5KT SOPN Inject 1.5 mg into the skin once a week.     Allergies:   Metoprolol, Amiodarone, Anastrozole, Calcitriol, Furosemide, Jardiance [empagliflozin], Tamoxifen, Tetracyclines & related, Toprol xl [metoprolol tartrate], Verapamil, Vicodin [hydrocodone-acetaminophen], Aromasin [exemestane], Gemfibrozil, and Other   Social History   Socioeconomic  History   Marital status: Widowed    Spouse name: Not on file   Number of children: Not on file   Years of education: Not on file   Highest education level: Not on file  Occupational History   Not on file  Tobacco Use   Smoking status: Former    Packs/day: 0.50    Years: 10.00    Total pack years: 5.00    Types: Cigarettes    Quit date: 11/12/2003    Years since quitting: 18.8   Smokeless tobacco: Never  Vaping Use   Vaping Use: Never used  Substance and Sexual Activity   Alcohol use: Yes    Comment: occasionally   Drug use: No   Sexual activity: Not Currently    Birth control/protection: Post-menopausal  Other Topics Concern   Not on file  Social History Narrative   Not on file   Social Determinants of Health   Financial Resource Strain: Not on file  Food Insecurity: Not on file  Transportation Needs: Not on file  Physical Activity: Not on file  Stress: Not on file  Social Connections: Not on file     Family History: The patient's family history includes Anesthesia problems in her mother; Diabetes in her father and mother.  ROS:   Please see the history of present illness.    ROS  All other systems reviewed and negative.   EKGs/Labs/Other Studies Reviewed:    The following studies were reviewed today: PAP compliance download  [facility] Holes 10/04/2020 Study Highlights    The left ventricular ejection fraction is moderately decreased (30-44%). Nuclear stress EF: 38%. There was no ST segment deviation noted during stress. The study is normal. This is a low risk study.   Normal pharmacologic nuclear stress test with no evidence for prior infarct or ischemia. Very frequent PACs and short runs of atrial tachycardia. LVEF appears underestimated, correlation with an echocardiogram is recommended.  2D echo 10/09/2020 IMPRESSIONS   1. Left ventricular ejection fraction, by estimation, is 70 to 75%. The  left ventricle has hyperdynamic function. The left  ventricle has no  regional wall motion abnormalities. There is mild left ventricular  hypertrophy.  Left ventricular diastolic  parameters are consistent with Grade II diastolic dysfunction  (pseudonormalization).   2. Right ventricular systolic function is normal. The right ventricular  size is normal. There is normal pulmonary artery systolic pressure.   3. Left atrial size was moderately dilated.   4. The mitral valve is normal in structure. Trivial mitral valve  regurgitation. No evidence of mitral stenosis.   5. The aortic valve is grossly normal. Aortic valve regurgitation is not  visualized. Mild aortic valve stenosis.    EKG:  EKG is not ordered today  Recent Labs: No results found for requested labs within last 365 days.   Recent Lipid Panel    Component Value Date/Time   CHOL 132 09/16/2021 0904   TRIG 210 (H) 09/16/2021 0904   HDL 46 09/16/2021 0904   CHOLHDL 2.9 09/16/2021 0904   CHOLHDL 4.2 10/24/2020 0344   VLDL 46 (H) 10/24/2020 0344   LDLCALC 52 09/16/2021 0904    Physical Exam:    VS:  BP (!) 140/60   Pulse 89   Ht 5' 4.5" (1.638 m)   Wt 218 lb 9.6 oz (99.2 kg)   SpO2 96%   BMI 36.94 kg/m     Wt Readings from Last 3 Encounters:  09/22/22 218 lb 9.6 oz (99.2 kg)  09/04/22 229 lb (103.9 kg)  07/14/22 230 lb 12.8 oz (104.7 kg)    GEN: Well nourished, well developed in no acute distress HEENT: Normal NECK: No JVD; No carotid bruits LYMPHATICS: No lymphadenopathy CARDIAC:RRR, no murmurs, rubs, gallops RESPIRATORY:  Clear to auscultation without rales, wheezing or rhonchi  ABDOMEN: Soft, non-tender, non-distended MUSCULOSKELETAL:  No edema; No deformity  SKIN: Warm and dry NEUROLOGIC:  Alert and oriented x 3 PSYCHIATRIC:  Normal affect  ASSESSMENT:    1. Nonrheumatic aortic valve stenosis   2. Essential hypertension   3. Paroxysmal atrial fibrillation (HCC)   4. History of chest pain   5. PVC (premature ventricular contraction)   6. DOE  (dyspnea on exertion)   7. Chronic diastolic heart failure ([place])   8. Pure hypercholesterolemia    PLAN:    In order of problems listed above:  Aortic stenosis -2D echo 08/2018 showed very mild AS with mean AVG 27mHg. -repeat echo 10/09/2020 was stable with mean AVG 11.372mg -repeat 2D echo  HTN -BP is controlled -Continue drug management with Cardizem CD 120 mg daily a with as needed refills   PAF -s/p afib ablation 08/2018 -She has made a normal sinus rhythm with no palpitations -Continue drug management with Cardizem CD 120 mg daily with as needed refills  Hx of CP -coronary CTA 08/2018 showing 25-49% LAD stenosis but no CAD on cardiac cath 3/22 -Lexiscan myoview 09/2020 showed no ischemia  PVCs -noted on stress test, echo and EKG -ziopatch showed 22% PVC load -seen by Ep and felt to be of outflow tract origin and EP felt she was not really symptomatic -she has not tolerated BB or Verapamil in the past and was kept on Cardizem -Continue Cardizem CD 120 mg daily   DOE -suspect this is multifactorial from obesity, sedentary state, PVCs, chronic diastolic CHF -she had elevated filling pressures on cath and restarted on lasix -PHTN noted and multifactorial from OSA, obesity, CHF -She does not appear volume overloaded on exam today -Continue Lasix as needed  Chronic diastolic CHF -she appears euvolemic on exam today -She appears euvolemic on exam today -She is only taking Lasix on a as needed basis -she  did not tolerate Jardiance  HLD -LDL goal is less than 100  -I have personally reviewed and interpreted outside PCP performed by patient's PCP which showed LDL 75 and HDL 34 on 03/10/2022 -She is statin intolerant  OSA - The patient is tolerating PAP therapy well without any problems. The PAP download performed by his DME was personally reviewed and interpreted by me today and showed an AHI of 3.1 /hr on 12/18 cm H2O with 93 % compliance in using more than 4 hours nightly.   The patient has been using and benefiting from PAP use and will continue to benefit from therapy.  -she would like to see if she still has OSA that requires treatment so I will get a HST off BiPAP but I told her I suspect she still has OSA    Followup with me in 1 year  Medication Adjustments/Labs and Tests Ordered: Current medicines are reviewed at length with the patient today.  Concerns regarding medicines are outlined above.  No orders of the defined types were placed in this encounter.  No orders of the defined types were placed in this encounter.   Signed, Fransico Him, MD  09/22/2022 8:44 AM    [place]

## 2022-09-22 NOTE — Telephone Encounter (Signed)
Pt has given PIN# [id number] and ok to proceed. I have reviewed the instructions again with the pt. Called and made the patient aware that she may proceed with the [facility] Sleep Study. PIN # provided to the patient. Patient made aware that she will be contacted after the test has been read with the results and any recommendations. Patient verbalized understanding and thanked me for the call.    Pt will do the sleep study one night this week. Pt aware we will call with results when they are in.

## 2022-09-22 NOTE — Patient Instructions (Signed)
Medication Instructions:  Your physician recommends that you continue on your current medications as directed. Please refer to the Current Medication list given to you today.  *If you need a refill on your cardiac medications before your next appointment, please call your pharmacy*   Lab Work: None.  If you have labs (blood work) drawn today and your tests are completely normal, you will receive your results only by: [place] (if you have MyChart) OR A paper copy in the mail If you have any lab test that is abnormal or we need to change your treatment, we will call you to review the results.   Testing/Procedures: Your physician has recommended that you have a sleep study. A home sleep study (Itamar) will be sent to your home. Please complete this test without using your cpap.   This test records several body functions during sleep, including: brain activity, eye movement, oxygen and carbon dioxide blood levels, heart rate and rhythm, breathing rate and rhythm, the flow of air through your mouth and nose, snoring, body muscle movements, and chest and belly movement.    Follow-Up: At [facility], you and your health needs are our priority.  As part of our continuing mission to provide you with exceptional heart care, we have created designated Provider Care Teams.  These Care Teams include your primary Cardiologist (physician) and Advanced Practice Providers (APPs -  Physician Assistants and Nurse Practitioners) who all work together to provide you with the care you need, when you need it.  We recommend signing up for the patient portal called "MyChart".  Sign up information is provided on this After Visit Summary.  MyChart is used to connect with patients for Virtual Visits (Telemedicine).  Patients are able to view lab/test results, encounter notes, upcoming appointments, etc.  Non-urgent messages can be sent to your provider as well.   To learn more about what you can do  with MyChart, go to [email or web address].    Your next appointment:   1 year(s)  Provider:   Fransico Him, MD

## 2022-09-22 NOTE — Telephone Encounter (Signed)
Pt was seen in the office today by Dr. Radford Pax who ordered an Itamar study. Pt agreeable to signed waiver and not open the box until she has been called with the PIN#.

## 2022-09-24 ENCOUNTER — Ambulatory Visit (HOSPITAL_COMMUNITY): Payer: Medicare PPO | Attending: Cardiology

## 2022-09-24 ENCOUNTER — Encounter: Payer: Self-pay | Admitting: Cardiology

## 2022-09-24 DIAGNOSIS — I350 Nonrheumatic aortic (valve) stenosis: Secondary | ICD-10-CM

## 2022-09-24 DIAGNOSIS — I5032 Chronic diastolic (congestive) heart failure: Secondary | ICD-10-CM | POA: Diagnosis not present

## 2022-09-24 LAB — ECHOCARDIOGRAM COMPLETE
AR max vel: 1.77 cm2
AV Area VTI: 1.85 cm2
AV Area mean vel: 1.41 cm2
AV Mean grad: 11 mmHg
AV Peak grad: 18.8 mmHg
Ao pk vel: 2.17 m/s
Area-P 1/2: 3.21 cm2
S' Lateral: 2 cm

## 2022-09-25 ENCOUNTER — Ambulatory Visit (INDEPENDENT_AMBULATORY_CARE_PROVIDER_SITE_OTHER): Payer: Medicare PPO | Admitting: Podiatry

## 2022-09-25 ENCOUNTER — Telehealth: Payer: Self-pay

## 2022-09-25 ENCOUNTER — Encounter: Payer: Self-pay | Admitting: Podiatry

## 2022-09-25 DIAGNOSIS — L600 Ingrowing nail: Secondary | ICD-10-CM | POA: Diagnosis not present

## 2022-09-25 DIAGNOSIS — I350 Nonrheumatic aortic (valve) stenosis: Secondary | ICD-10-CM

## 2022-09-25 NOTE — Telephone Encounter (Signed)
-----  Message from Sueanne Margarita, MD sent at 09/24/2022  2:43 PM EST ----- 2D echo showed normal heart function with EF 70-75%, mildly thickened heart muscle,  mildly enlarged LA, calcified mitral valve annulus normal for age, mild AS>>stable from prior echo 2/22>>repeat echo 1 year

## 2022-09-25 NOTE — Telephone Encounter (Signed)
Reviewed results with patient who verbalizes understanding of normal heart function, mildly thickened heart muscle, mildly enlarged LA and mild AS which is stable from her last Echo. Verbalizes plan to check Echo again in one year. Order placed. Forwarded to scheduling team.

## 2022-09-25 NOTE — Patient Instructions (Signed)

## 2022-09-27 ENCOUNTER — Encounter (INDEPENDENT_AMBULATORY_CARE_PROVIDER_SITE_OTHER): Payer: Medicare PPO | Admitting: Cardiology

## 2022-09-27 DIAGNOSIS — G4733 Obstructive sleep apnea (adult) (pediatric): Secondary | ICD-10-CM

## 2022-09-28 DIAGNOSIS — D6869 Other thrombophilia: Secondary | ICD-10-CM | POA: Diagnosis not present

## 2022-09-28 DIAGNOSIS — Z853 Personal history of malignant neoplasm of breast: Secondary | ICD-10-CM | POA: Diagnosis not present

## 2022-09-28 DIAGNOSIS — I10 Essential (primary) hypertension: Secondary | ICD-10-CM | POA: Diagnosis not present

## 2022-09-28 DIAGNOSIS — G4733 Obstructive sleep apnea (adult) (pediatric): Secondary | ICD-10-CM | POA: Diagnosis not present

## 2022-09-28 DIAGNOSIS — Z6837 Body mass index (BMI) 37.0-37.9, adult: Secondary | ICD-10-CM | POA: Diagnosis not present

## 2022-09-28 DIAGNOSIS — I4891 Unspecified atrial fibrillation: Secondary | ICD-10-CM | POA: Diagnosis not present

## 2022-09-28 DIAGNOSIS — Z7985 Long-term (current) use of injectable non-insulin antidiabetic drugs: Secondary | ICD-10-CM | POA: Diagnosis not present

## 2022-09-28 DIAGNOSIS — E119 Type 2 diabetes mellitus without complications: Secondary | ICD-10-CM | POA: Diagnosis not present

## 2022-09-29 ENCOUNTER — Ambulatory Visit: Payer: Medicare PPO | Attending: Cardiology

## 2022-09-29 DIAGNOSIS — G4733 Obstructive sleep apnea (adult) (pediatric): Secondary | ICD-10-CM

## 2022-09-29 NOTE — Progress Notes (Signed)
Subjective:   Patient ID: Jenna Mcdonald, female   DOB: 77 y.o.   MRN: [id number]   HPI Patient presents with chronic ingrown toenail of the both big toes that are sore and make walking difficult.  Patient states has tried soft shoes has tried different modalities without relief of symptoms and states that they need to be corrected   ROS      Objective:  Physical Exam  Vascular status found to be intact with patient found to have incurvation of the hallux nails both feet medial border painful when pressed and make shoe gear difficult with no erythema edema or drainage noted.  Good digital perfusion well-oriented chronic ingrown toe     Assessment:  Chronic ingrown toenail of the big toes both feet medial border with pain with no indication of infection     Plan:  H&P reviewed recommended correction she is excited to get this done.  I did explain preoperative procedure she wants surgery and today and will traded each big toe 60 mg like Marcaine mixture sterile prep done using sterile instrumentation and went ahead remove the medial borders exposed matrix applied phenol 3 applications 30 seconds followed by alcohol lavage sterile dressing gave instructions on soaks and reappoint

## 2022-09-29 NOTE — Procedures (Signed)
SLEEP STUDY REPORT Patient Information Study Date: 09/27/2022 Patient Name: Jenna Mcdonald Patient ID: [id number] Birth Date: 1945-11-28 Age: 77 Gender: Female BMI: 37.3 (W=218 lb, H=5' 4'') Stopbang: 4 Referring Physician: Fransico Him, MD  TEST DESCRIPTION: Home sleep apnea testing was completed using the WatchPat, a Type 1 device, utilizing peripheral arterial tonometry (PAT), chest movement, actigraphy, pulse oximetry, pulse rate, body position and snore. AHI was calculated with apnea and hypopnea using valid sleep time as the denominator. RDI includes apneas, hypopneas, and RERAs. The data acquired and the scoring of sleep and all associated events were performed in accordance with the recommended standards and specifications as outlined in the AASM Manual for the Scoring of Sleep and Associated Events 2.2.0 (2015).   FINDINGS:   1. Mild to moderate Obstructive Sleep Apnea with AHI 13.8/hr.   2. No Central Sleep Apnea with pAHIc 0/hr.   3. Oxygen desaturations as low as 73%.   4. Moderate snoring was present. O2 sats were < 88% for 64mn.   5. Total sleep time was 1 hrs and 48 min.   6. 0% of total sleep time was spent in REM sleep.   7. Normal sleep onset latency at 23 min.   8. No REM sleep.   9. Total awakenings were 3.  10. Arrhythmia detection:  None.  DIAGNOSIS: Mild to moderate Obstructive Sleep Apnea (G47.33) Nocturnal Hypoxemia  RECOMMENDATIONS:   1.  Clinical correlation of these findings is necessary.  The decision to treat obstructive sleep apnea (OSA) is usually based on the presence of apnea symptoms or the presence of associated medical conditions such as Hypertension, Congestive Heart Failure, Atrial Fibrillation or Obesity.  The most common symptoms of OSA are snoring, gasping for breath while sleeping, daytime sleepiness and fatigue.   2.  Initiating apnea therapy is recommended given the presence of symptoms and/or associated conditions. Recommend  proceeding with one of the following:     a.  Auto-CPAP therapy with a pressure range of 5-20cm H2O.     b.  An oral appliance (OA) that can be obtained from certain dentists with expertise in sleep medicine.  These are primarily of use in non-obese patients with mild and moderate disease.     c.  An ENT consultation which may be useful to look for specific causes of obstruction and possible treatment options.     d.  If patient is intolerant to PAP therapy, consider referral to ENT for evaluation for hypoglossal nerve stimulator.   3.  Close follow-up is necessary to ensure success with CPAP or oral appliance therapy for maximum benefit.  4.  A follow-up oximetry study on CPAP is recommended to assess the adequacy of therapy and determine the need for supplemental oxygen or the potential need for Bi-level therapy.  An arterial blood gas to determine the adequacy of baseline ventilation and oxygenation should also be considered.  5.  Healthy sleep recommendations include:  adequate nightly sleep (normal 7-9 hrs/night), avoidance of caffeine after noon and alcohol near bedtime, and maintaining a sleep environment that is cool, dark and quiet.  6.  Weight loss for overweight patients is recommended.  Even modest amounts of weight loss can significantly improve the severity of sleep apnea.  7.  Snoring recommendations include:  weight loss where appropriate, side sleeping, and avoidance of alcohol before bed.  8.  Operation of motor vehicle should be avoided when sleepy.  Signature: TFransico Him MD; [facility] Diplomat, American Board of Sleep Medicine Electronically  Signed: 09/29/2022

## 2022-09-30 ENCOUNTER — Telehealth: Payer: Self-pay | Admitting: *Deleted

## 2022-09-30 DIAGNOSIS — G4733 Obstructive sleep apnea (adult) (pediatric): Secondary | ICD-10-CM

## 2022-09-30 NOTE — Telephone Encounter (Signed)
-----   Message from Lauralee Evener, [place] sent at 09/30/2022 12:23 PM EST -----  ----- Message ----- From: Sueanne Margarita, MD Sent: 09/29/2022  12:24 PM EST To: [facility] Sleep Studies  Patient has significant OSA with nocturnal hypoxemia off PAP and needs to stay on BiPAP and current settings.  Please get an overnight pulse ox on BiPAP

## 2022-09-30 NOTE — Telephone Encounter (Signed)
The patient has been notified of the result and verbalized understanding.  All questions (if any) were answered. Marolyn Hammock, [place] 09/30/2022 6:17 PM     Will get an overnight pulse ox on BiPAP

## 2022-10-02 ENCOUNTER — Encounter (HOSPITAL_COMMUNITY): Payer: Self-pay | Admitting: *Deleted

## 2022-10-02 IMAGING — DX DG CHEST 2V
1 series · 1 of 1 positions shown · non-contrast
Comparison: 10/12/2020, 07/21/2018

CLINICAL DATA: Chest pain

EXAM:
CHEST - 2 VIEW

[chest pa]
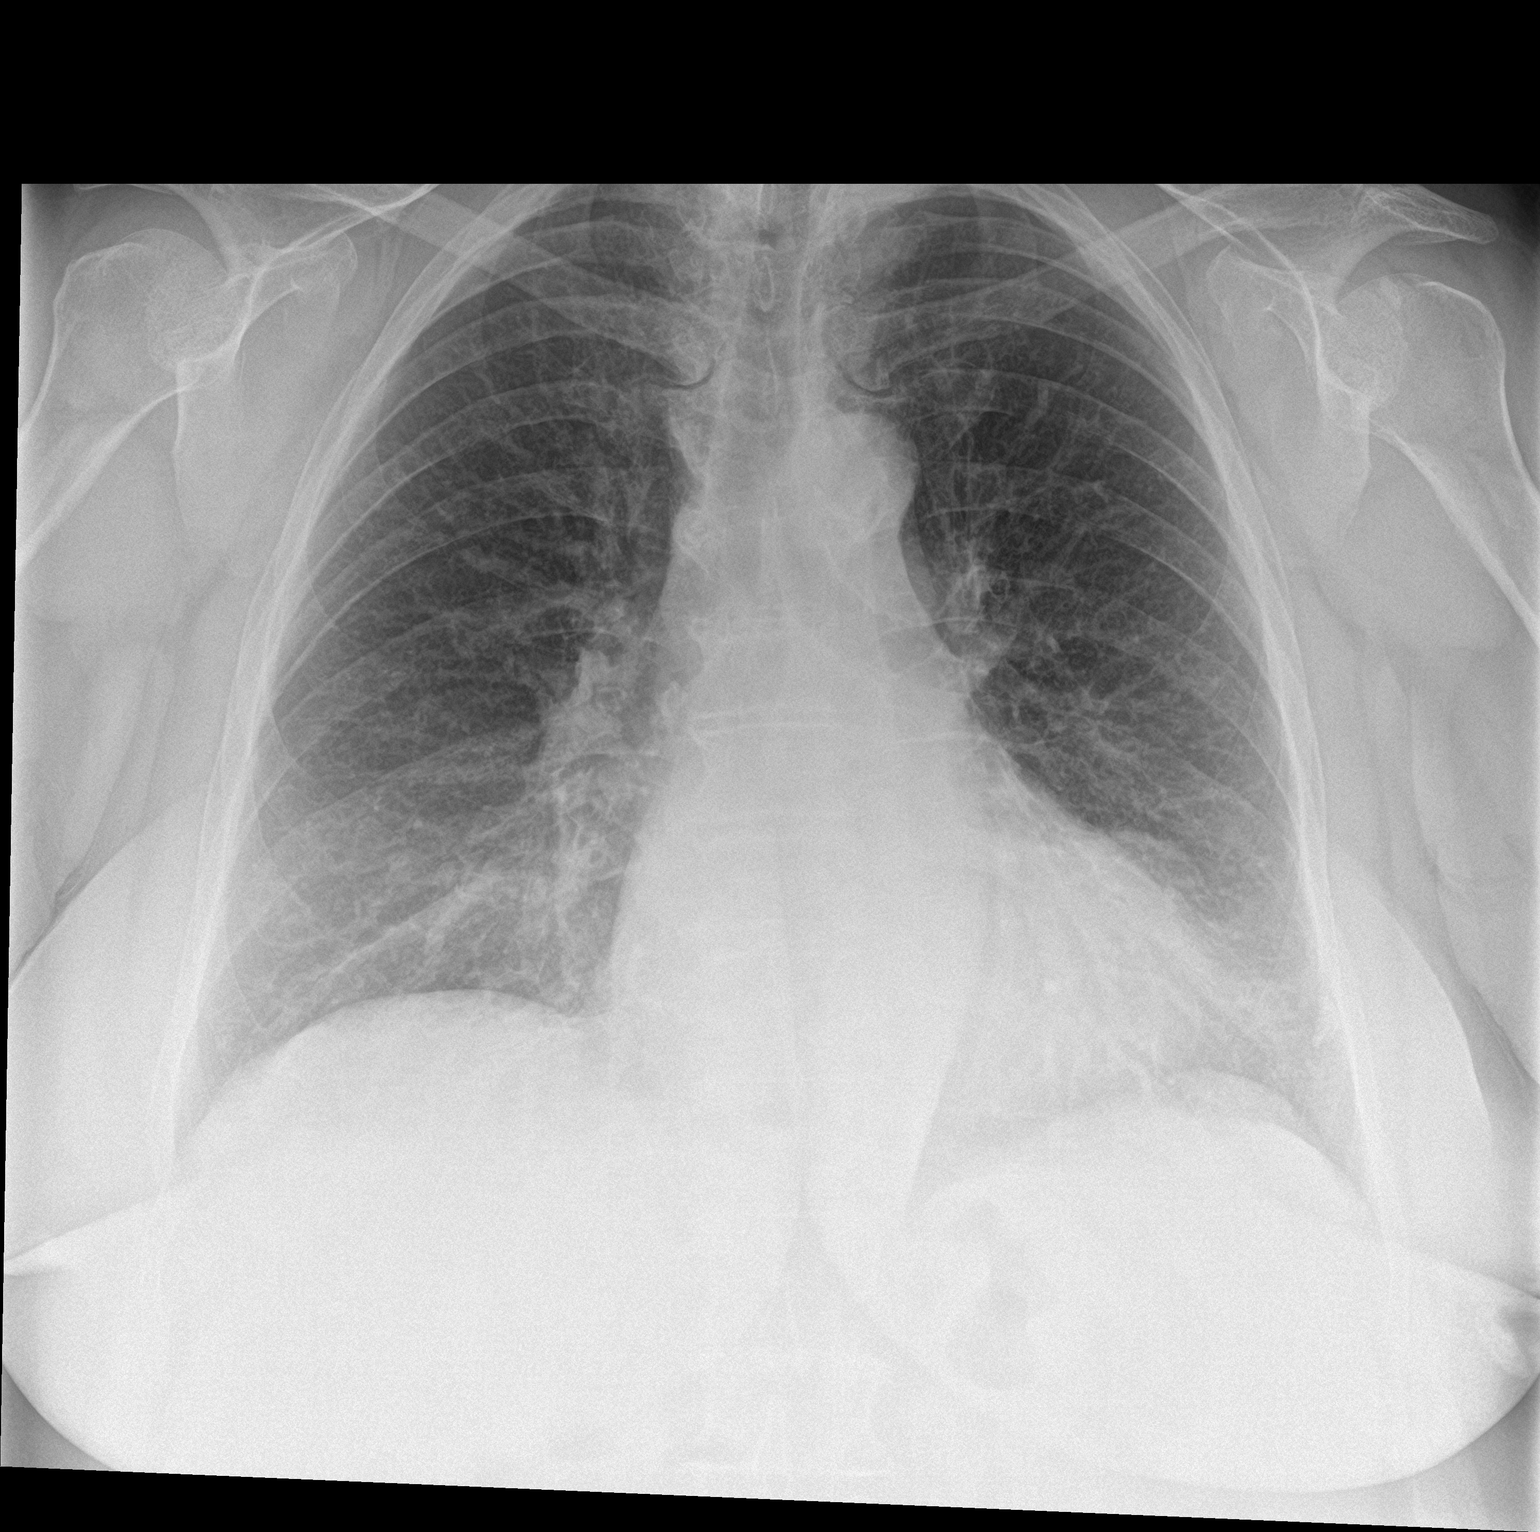

[1 of 1 positions shown; findings below may reference images not displayed]

FINDINGS: No focal opacity or pleural effusion. Mild diffuse interstitial
opacity some of which is felt chronic. Normal cardiomediastinal
silhouette. No pneumothorax.
IMPRESSION: No active cardiopulmonary disease.

## 2022-10-12 LAB — CUP PACEART REMOTE DEVICE CHECK
Date Time Interrogation Session: 20240214231232
Implantable Pulse Generator Implant Date: 20230524

## 2022-10-13 ENCOUNTER — Ambulatory Visit (INDEPENDENT_AMBULATORY_CARE_PROVIDER_SITE_OTHER): Payer: Medicare PPO

## 2022-10-13 DIAGNOSIS — I480 Paroxysmal atrial fibrillation: Secondary | ICD-10-CM | POA: Diagnosis not present

## 2022-10-15 DIAGNOSIS — E65 Localized adiposity: Secondary | ICD-10-CM | POA: Diagnosis not present

## 2022-10-15 DIAGNOSIS — G8929 Other chronic pain: Secondary | ICD-10-CM | POA: Diagnosis not present

## 2022-10-15 DIAGNOSIS — Z6837 Body mass index (BMI) 37.0-37.9, adult: Secondary | ICD-10-CM | POA: Diagnosis not present

## 2022-10-15 DIAGNOSIS — E1169 Type 2 diabetes mellitus with other specified complication: Secondary | ICD-10-CM | POA: Diagnosis not present

## 2022-10-15 DIAGNOSIS — M545 Low back pain, unspecified: Secondary | ICD-10-CM | POA: Diagnosis not present

## 2022-10-16 DIAGNOSIS — R0683 Snoring: Secondary | ICD-10-CM | POA: Diagnosis not present

## 2022-10-16 DIAGNOSIS — G4730 Sleep apnea, unspecified: Secondary | ICD-10-CM | POA: Diagnosis not present

## 2022-10-21 ENCOUNTER — Telehealth: Payer: Self-pay | Admitting: Cardiology

## 2022-10-21 NOTE — Telephone Encounter (Signed)
Patient calling to discuss pulse oximetry results. Advised pt once the MD has review and released the result someone from the office will contact her . Patient voiced understanding.

## 2022-10-21 NOTE — Telephone Encounter (Signed)
Patient is requesting to discuss pulse oximetry study results.

## 2022-10-22 DIAGNOSIS — F4321 Adjustment disorder with depressed mood: Secondary | ICD-10-CM | POA: Diagnosis not present

## 2022-10-23 ENCOUNTER — Ambulatory Visit (INDEPENDENT_AMBULATORY_CARE_PROVIDER_SITE_OTHER): Payer: Medicare PPO | Admitting: Podiatry

## 2022-10-23 DIAGNOSIS — L600 Ingrowing nail: Secondary | ICD-10-CM | POA: Diagnosis not present

## 2022-10-23 NOTE — Progress Notes (Signed)
  Subjective:  Patient ID: Jenna Mcdonald, female    DOB: January 25, 1946,  MRN: [id number]  Chief Complaint  Patient presents with   Toe Pain    Follow-up. R Foot pain    77 y.o. female presents with the above complaint. History confirmed with patient.  Still having issues after her ingrown nail removal a few weeks ago  Objective:  Physical Exam: warm, good capillary refill, no trophic changes or ulcerative lesions, normal DP and PT pulses, normal sensory exam, and medial border of right hallux has some thickened skin that is still tender but there is no recurrence of ingrown or paronychia no signs of infection, left hallux doing well.  Assessment:   1. Ingrown nail      Plan:  Patient was evaluated and treated and all questions answered.  Most of her pain and sensitivity is around some roughened thick skin which I expect will soften and resolve with time.  I recommend she continue to do soaking and utilize a file or pumice stone and moisturizing lotion to soften this.  I do not see an indication currently to repeat the procedure which will restart the healing process and there is no evidence of recurrent ingrown or new foot infection.  Return if symptoms worsen or fail to improve.

## 2022-10-23 NOTE — Patient Instructions (Signed)
Keep soaking the toe at least once a day. After this, use a file to get the rough skin off then apply a moisturizing lotion. Wear the silicone pads on the toe when you are in shoes

## 2022-10-27 NOTE — Progress Notes (Signed)
[facility] Summary Report / Loop Recorder 

## 2022-10-28 DIAGNOSIS — F4323 Adjustment disorder with mixed anxiety and depressed mood: Secondary | ICD-10-CM | POA: Diagnosis not present

## 2022-10-30 ENCOUNTER — Telehealth: Payer: Self-pay | Admitting: Cardiology

## 2022-10-30 NOTE — Telephone Encounter (Signed)
Pt would like a callback regarding Oximetry test results. Please advise.

## 2022-11-04 DIAGNOSIS — F4323 Adjustment disorder with mixed anxiety and depressed mood: Secondary | ICD-10-CM | POA: Diagnosis not present

## 2022-11-05 NOTE — Telephone Encounter (Addendum)
Reached out to patient left message to let her know her results have not been read yet by Dr Radford Pax. We will contact her as soon as it has been read.

## 2022-11-11 ENCOUNTER — Encounter: Payer: Self-pay | Admitting: Physical Therapy

## 2022-11-11 ENCOUNTER — Ambulatory Visit: Payer: Medicare PPO | Attending: Family Medicine | Admitting: Physical Therapy

## 2022-11-11 ENCOUNTER — Ambulatory Visit (INDEPENDENT_AMBULATORY_CARE_PROVIDER_SITE_OTHER): Payer: Medicare PPO

## 2022-11-11 DIAGNOSIS — R262 Difficulty in walking, not elsewhere classified: Secondary | ICD-10-CM

## 2022-11-11 DIAGNOSIS — M5459 Other low back pain: Secondary | ICD-10-CM

## 2022-11-11 DIAGNOSIS — M62830 Muscle spasm of back: Secondary | ICD-10-CM | POA: Diagnosis not present

## 2022-11-11 DIAGNOSIS — I480 Paroxysmal atrial fibrillation: Secondary | ICD-10-CM

## 2022-11-11 NOTE — Therapy (Signed)
OUTPATIENT PHYSICAL THERAPY THORACOLUMBAR EVALUATION   Patient Name: Jenna Mcdonald MRN: [id number] DOB:1945/10/24, 77 y.o., female Today's Date: 11/11/2022  END OF SESSION:  PT End of Session - 11/11/22 1017     Visit Number 1    Number of Visits 13    Date for PT Re-Evaluation 01/27/23    Authorization Type [employer]    PT Start Time 1014    PT Stop Time 1100    PT Time Calculation (min) 46 min    Activity Tolerance Patient tolerated treatment well    Behavior During Therapy [facility] for tasks assessed/performed             Past Medical History:  Diagnosis Date   Anemia    Aortic stenosis    mild by echo 09/2020 and 08/2022 with mean AVG 18mmHg   Breast CA ([place])    left   Breast cancer (HCC)    Carotid stenosis    1-39% left   Degenerative disc disease, lumbar    of the knees   Diabetes mellitus    Dysrhythmia    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[place])    Encounter for therapeutic drug monitoring 08/17/2018   Fatigue    Severe secondary to to gemfibrozil   Gastroesophageal reflux disease    denies   GI bleeding    a. 06/2018: EGD showed multiple nonbleeding duodenal ulcers. 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   Hammertoe    left second toe   Hypercholesteremia    Hypertension    Insomnia    Irritable bowel    Low back pain    Metabolic syndrome    Morbid obesity (HCC)    OSA on CPAP \   bipap   Osteoarthritis    Osteopenia    Persistent atrial fibrillation [facility])    s/p afib ablation - Dr. Rayann Heman 08/2018   Personal history of radiation therapy    Pneumonia    PONV (postoperative nausea and vomiting)    Sigmoid diverticulosis    Thalassemia minor    Unstable angina ([place]) 10/23/2020   Vitamin D deficiency    Past Surgical History:  Procedure Laterality Date   ATRIAL FIBRILLATION ABLATION N/A 09/21/2018   Procedure: ATRIAL FIBRILLATION ABLATION;  Surgeon: Thompson Grayer, MD;  Location: [place] CV LAB;  Service: Cardiovascular;  Laterality: N/A;   BIOPSY  07/06/2018   Procedure: BIOPSY;  Surgeon: Clarene Essex, MD;  Location: [facility] ENDOSCOPY;  Service: Endoscopy;;   BREAST LUMPECTOMY Left 05/28/2009   BREAST SURGERY Left 2010   lumpectomy   CARDIOVERSION N/A 07/07/2018   Procedure: CARDIOVERSION;  Surgeon: [name], MD;  Location: [facility] ENDOSCOPY;  Service: Cardiovascular;  Laterality: N/A;   CARDIOVERSION N/A 07/14/2018   Procedure: CARDIOVERSION;  Surgeon: Larey Dresser, MD;  Location[facility] ENDOSCOPY;  Service: Cardiovascular;  Laterality: N/A;   CESAREAN SECTION     COLONOSCOPY WITH PROPOFOL N/A 07/06/2018   Procedure: COLONOSCOPY WITH PROPOFOL;  Surgeon: Clarene Essex, MD;  Location: [place];  Service: Endoscopy;  Laterality: N/A;   CYSTECTOMY  1970   pilonidal    DILATION AND CURETTAGE OF UTERUS     ESOPHAGOGASTRODUODENOSCOPY (EGD) WITH PROPOFOL N/A 07/06/2018   Procedure: ESOPHAGOGASTRODUODENOSCOPY (EGD) WITH PROPOFOL;  Surgeon: Clarene Essex, MD;  Location: [place];  Service: Endoscopy;  Laterality: N/A;   EYE SURGERY Bilateral 07/2013   cataracts   HOT HEMOSTASIS N/A 07/06/2018   Procedure: HOT HEMOSTASIS (ARGON PLASMA COAGULATION/BICAP);  Surgeon: Watt Climes,  Altamese Dilling, MD;  Location: [place];  Service: Endoscopy;  Laterality: N/A;   implantable loop recorder placement  01/15/2022   Medtronic Reveal Linq model [id number] implantable loop recorder (SN [id number]) implanted for AF management by Dr Rayann Heman   MAXIMUM ACCESS (MAS)POSTERIOR LUMBAR INTERBODY FUSION (PLIF) 2 LEVEL N/A 09/22/2013   Procedure: FOR MAXIMUM ACCESS  POSTERIOR LUMBAR INTERBODY FUSION LUMBAR THREE-FOUR FOUR-FIVE;  Surgeon: Eustace Moore, MD;  Location: [place] NEURO ORS;  Service: Neurosurgery;  Laterality: N/A;   OOPHORECTOMY     wedge section not rem   REPLACEMENT TOTAL KNEE Left 2011   REPLACEMENT TOTAL KNEE     RIGHT/LEFT HEART CATH AND CORONARY ANGIOGRAPHY N/A 10/26/2020   Procedure:  RIGHT/LEFT HEART CATH AND CORONARY ANGIOGRAPHY;  Surgeon: Leonie Man, MD;  Location: [place] CV LAB;  Service: Cardiovascular;  Laterality: N/A;   TEE WITHOUT CARDIOVERSION N/A 07/07/2018   Procedure: TRANSESOPHAGEAL ECHOCARDIOGRAM (TEE);  Surgeon: [name], MD;  Location: [place];  Service: Cardiovascular;  Laterality: N/A;   TEE WITHOUT CARDIOVERSION N/A 09/21/2018   Procedure: TRANSESOPHAGEAL ECHOCARDIOGRAM (TEE);  Surgeon: Acie Fredrickson Wonda Cheng, MD;  Location: [facility] ENDOSCOPY;  Service: Cardiovascular;  Laterality: N/A;   TONSILLECTOMY     TUBAL LIGATION     Patient Active Problem List   Diagnosis Date Noted   Shortness of breath    Unstable angina ([place]) 10/23/2020   Secondary hypercoagulable state ([place]) 10/15/2020   Encounter for therapeutic drug monitoring 08/17/2018   CKD (chronic kidney disease) stage 3, GFR 30-59 ml/min (HCC) 07/21/2018   Chronic anemia 07/21/2018   Anemia    Heme positive stool    Atrial fibrillation with RVR ([place]) 07/04/2018   Carotid stenosis    Bruit of left carotid artery 09/22/2017   Aortic stenosis    Heart murmur 09/09/2016   Morbid obesity ([place]) 02/05/2016   Dry eye 05/17/2015   Type 2 diabetes mellitus without complication ([place]) [id number]   Breast cancer, left breast ([place]) 07/22/2014   Obesity, Class III, BMI 40-49.9 (morbid obesity) ([place]) 07/06/2014   Macular pseudohole 05/17/2014   History of surgical procedure 05/17/2014   OSA (obstructive sleep apnea) 11/17/2013   S/P lumbar spinal fusion 09/22/2013   Persistent atrial fibrillation ([place]) 09/11/2011    Class: Acute   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[place]) 09/11/2011    Class: Chronic   Essential hypertension 09/11/2011   Exogenous obesity 09/11/2011   Gastroesophageal reflux disease 09/11/2011   Thalassemia minor 09/11/2011    Class: Chronic   Breast cancer ([place]) 09/11/2011    Class: Chronic   Degenerative joint disease 09/11/2011    Class: Chronic   Irritable bowel disease  09/11/2011    Class: Chronic   Lumbar disc disease 09/11/2011    PCP: Louis Matte, MD  REFERRING PROVIDER: Parke Simmers, DO  REFERRING DIAG: chronic bag and knee pain  Rationale for Evaluation and Treatment: Rehabilitation  THERAPY DIAG:  Other low back pain  Difficulty in walking, not elsewhere classified  Muscle spasm of back  ONSET DATE: 10/14/22  SUBJECTIVE:  SUBJECTIVE STATEMENT: Patient reports that she is doing well overall, she has lost about 35# and her knees are feeling better, she is able to be more active and the low back is still really hurting.  She has a history of low back surgery fusion L3-5  PERTINENT HISTORY:  See above  PAIN:  Are you having pain? 8/10 in the low back, worse with standing , walking and activity, sit and rest pain can be a 5/10 but it is always there, ache, feels weak int eh core  PRECAUTIONS: Other: lumbar fusion 2015  WEIGHT BEARING RESTRICTIONS: No  FALLS:  Has patient fallen in last 6 months? No  LIVING ENVIRONMENT: Lives with: lives with their family Lives in: House/apartment Stairs: Yes: Internal: 15 steps; can reach both Has following equipment at home: Single point cane and uses the cane for long distances grocery shopping  OCCUPATION: retired  PLOF: Independent and gardens, does Haematologist, cooks and cleans  PATIENT GOALS: strengthen core and have less pain  NEXT MD VISIT: 11/18/22  OBJECTIVE:   DIAGNOSTIC FINDINGS:  No recent x-rays   PATIENT SURVEYS:  ODI = 70% disability  COGNITION: Overall cognitive status: Within functional limits for tasks assessed     SENSATION: WFL  MUSCLE LENGTH: Very tight HS, piriformis and quads  POSTURE: rounded shoulders and forward head  PALPATION: Tender and tight in the low back mms, tightness iin  the rhomboids and the upper traps  LUMBAR ROM:   AROM eval  Flexion Decreased 50%  Extension Decreased 100%  Right lateral flexion Decreased 75%  Left lateral flexion Decreased 75%  Right rotation   Left rotation    (Blank rows = not tested)  LOWER EXTREMITY ROM:     Active  Right eval Left eval  Hip flexion    Hip extension    Hip abduction    Hip adduction    Hip internal rotation    Hip external rotation    Knee flexion 10 5  Knee extension 95 100  Ankle dorsiflexion    Ankle plantarflexion    Ankle inversion    Ankle eversion     (Blank rows = not tested)  LOWER EXTREMITY MMT:    MMT Right eval Left eval  Hip flexion 4- 4-  Hip extension 4- 4-  Hip abduction 4- 4-  Hip adduction    Hip internal rotation    Hip external rotation    Knee flexion 3+ 4-  Knee extension 3+ 4-  Ankle dorsiflexion 4 4  Ankle plantarflexion    Ankle inversion    Ankle eversion     (Blank rows = not tested)  FUNCTIONAL TESTS:  3 minute walk test: 220 feet had to stop at 33minute 30 seconds ODI:  GAIT: Distance walked: 220 feet Assistive device utilized: Single point cane Level of assistance: Complete Independence Comments: stopped due to back pain  TODAY'S TREATMENT:  DATE:     PATIENT EDUCATION:  Education details: POC Person educated: Patient Education method: Explanation Education comprehension: verbalized understanding  HOME EXERCISE PROGRAM: TBD  ASSESSMENT:  CLINICAL IMPRESSION: Patient is a 77 y.o. female who was seen today for physical therapy evaluation and treatment for chronic low back pain.  She has a history of a 3 level fusion.  We saw her about a year ago and she did very well, she has lost about 35# since then and reports that her knees are feeling much better, but her back is the issue, we tried a 3 minute walk test and she  could not go more than a minute and a half about 200 feet due to back pain.   OBJECTIVE IMPAIRMENTS: Abnormal gait, cardiopulmonary status limiting activity, decreased activity tolerance, decreased balance, decreased coordination, decreased endurance, decreased mobility, difficulty walking, decreased ROM, decreased strength, increased fascial restrictions, impaired perceived functional ability, increased muscle spasms, impaired flexibility, improper body mechanics, and pain.   REHAB POTENTIAL: Good  CLINICAL DECISION MAKING: Stable/uncomplicated  EVALUATION COMPLEXITY: Low   GOALS: Goals reviewed with patient? Yes  SHORT TERM GOALS: Target date: 11/24/22  Independent with initial HEP Goal status: INITIAL  LONG TERM GOALS: Target date: 02/11/23  Independent iwht posture and body mechanics Goal status: INITIAL  2.  Walk 500 feet in a 3 minute walk test Goal status: INITIAL  3.  Decrease pain 25% Goal status: INITIAL  4.  Be able to cook a meal without pain > 5/10 Goal status: INITIAL  5.  Independent with HEP or gym Goal status: INITIAL  PLAN:  PT FREQUENCY: 1-2x/week  PT DURATION: 12 weeks  PLANNED INTERVENTIONS: Therapeutic exercises, Therapeutic activity, Neuromuscular re-education, Balance training, Gait training, Patient/Family education, Self Care, Joint mobilization, Dry Needling, Electrical stimulation, Cryotherapy, Moist heat, and Manual therapy.  PLAN FOR NEXT SESSION: slowly start gym activities and flexibility   Sumner Boast, PT 11/11/2022, 10:18 AM

## 2022-11-12 DIAGNOSIS — F4323 Adjustment disorder with mixed anxiety and depressed mood: Secondary | ICD-10-CM | POA: Diagnosis not present

## 2022-11-12 LAB — CUP PACEART REMOTE DEVICE CHECK
Date Time Interrogation Session: 20240318231218
Implantable Pulse Generator Implant Date: 20230524

## 2022-11-18 ENCOUNTER — Ambulatory Visit: Payer: Medicare PPO | Admitting: Physical Therapy

## 2022-11-18 ENCOUNTER — Encounter: Payer: Self-pay | Admitting: Physical Therapy

## 2022-11-18 DIAGNOSIS — R262 Difficulty in walking, not elsewhere classified: Secondary | ICD-10-CM

## 2022-11-18 DIAGNOSIS — M5459 Other low back pain: Secondary | ICD-10-CM | POA: Diagnosis not present

## 2022-11-18 DIAGNOSIS — M62830 Muscle spasm of back: Secondary | ICD-10-CM

## 2022-11-18 NOTE — Therapy (Signed)
OUTPATIENT PHYSICAL THERAPY THORACOLUMBAR TREATMENT   Patient Name: Jenna Mcdonald MRN: [id number] DOB:1945/09/30, 77 y.o., female Today's Date: 11/18/2022  END OF SESSION:  PT End of Session - 11/18/22 0803     Visit Number 2    Number of Visits 13    Date for PT Re-Evaluation 01/27/23    Authorization Type [employer]    PT Start Time 0755    PT Stop Time 0840    PT Time Calculation (min) 45 min    Activity Tolerance Patient tolerated treatment well    Behavior During Therapy [facility] for tasks assessed/performed             Past Medical History:  Diagnosis Date   Anemia    Aortic stenosis    mild by echo 09/2020 and 08/2022 with mean AVG 7mmHg   Breast CA ([place])    left   Breast cancer (HCC)    Carotid stenosis    1-39% left   Degenerative disc disease, lumbar    of the knees   Diabetes mellitus    Dysrhythmia    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[place])    Encounter for therapeutic drug monitoring 08/17/2018   Fatigue    Severe secondary to to gemfibrozil   Gastroesophageal reflux disease    denies   GI bleeding    a. 06/2018: EGD showed multiple nonbleeding duodenal ulcers. 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   Hammertoe    left second toe   Hypercholesteremia    Hypertension    Insomnia    Irritable bowel    Low back pain    Metabolic syndrome    Morbid obesity (HCC)    OSA on CPAP \   bipap   Osteoarthritis    Osteopenia    Persistent atrial fibrillation [facility])    s/p afib ablation - Dr. Rayann Heman 08/2018   Personal history of radiation therapy    Pneumonia    PONV (postoperative nausea and vomiting)    Sigmoid diverticulosis    Thalassemia minor    Unstable angina ([place]) 10/23/2020   Vitamin D deficiency    Past Surgical History:  Procedure Laterality Date   ATRIAL FIBRILLATION ABLATION N/A 09/21/2018   Procedure: ATRIAL FIBRILLATION ABLATION;  Surgeon: Thompson Grayer, MD;  Location: [place] CV LAB;  Service: Cardiovascular;  Laterality: N/A;   BIOPSY  07/06/2018   Procedure: BIOPSY;  Surgeon: Clarene Essex, MD;  Location: [facility] ENDOSCOPY;  Service: Endoscopy;;   BREAST LUMPECTOMY Left 05/28/2009   BREAST SURGERY Left 2010   lumpectomy   CARDIOVERSION N/A 07/07/2018   Procedure: CARDIOVERSION;  Surgeon: [name], MD;  Location: [facility] ENDOSCOPY;  Service: Cardiovascular;  Laterality: N/A;   CARDIOVERSION N/A 07/14/2018   Procedure: CARDIOVERSION;  Surgeon: Larey Dresser, MD;  Location: [facility] ENDOSCOPY;  Service: Cardiovascular;  Laterality: N/A;   CESAREAN SECTION     COLONOSCOPY WITH PROPOFOL N/A 07/06/2018   Procedure: COLONOSCOPY WITH PROPOFOL;  Surgeon: Clarene Essex, MD;  Location: [place];  Service: Endoscopy;  Laterality: N/A;   CYSTECTOMY  1970   pilonidal    DILATION AND CURETTAGE OF UTERUS     ESOPHAGOGASTRODUODENOSCOPY (EGD) WITH PROPOFOL N/A 07/06/2018   Procedure: ESOPHAGOGASTRODUODENOSCOPY (EGD) WITH PROPOFOL;  Surgeon: Clarene Essex, MD;  Location: [place];  Service: Endoscopy;  Laterality: N/A;   EYE SURGERY Bilateral 07/2013   cataracts   HOT HEMOSTASIS N/A 07/06/2018   Procedure: HOT HEMOSTASIS (ARGON PLASMA COAGULATION/BICAP);  Surgeon: Watt Climes,  Altamese Dilling, MD;  Location: [place];  Service: Endoscopy;  Laterality: N/A;   implantable loop recorder placement  01/15/2022   Medtronic Reveal Linq model [id number] implantable loop recorder (SN [id number]) implanted for AF management by Dr Rayann Heman   MAXIMUM ACCESS (MAS)POSTERIOR LUMBAR INTERBODY FUSION (PLIF) 2 LEVEL N/A 09/22/2013   Procedure: FOR MAXIMUM ACCESS  POSTERIOR LUMBAR INTERBODY FUSION LUMBAR THREE-FOUR FOUR-FIVE;  Surgeon: Eustace Moore, MD;  Location: [place] NEURO ORS;  Service: Neurosurgery;  Laterality: N/A;   OOPHORECTOMY     wedge section not rem   REPLACEMENT TOTAL KNEE Left 2011   REPLACEMENT TOTAL KNEE     RIGHT/LEFT HEART CATH AND CORONARY ANGIOGRAPHY N/A 10/26/2020   Procedure:  RIGHT/LEFT HEART CATH AND CORONARY ANGIOGRAPHY;  Surgeon: Leonie Man, MD;  Location: [place] CV LAB;  Service: Cardiovascular;  Laterality: N/A;   TEE WITHOUT CARDIOVERSION N/A 07/07/2018   Procedure: TRANSESOPHAGEAL ECHOCARDIOGRAM (TEE);  Surgeon: [name], MD;  Location: [place];  Service: Cardiovascular;  Laterality: N/A;   TEE WITHOUT CARDIOVERSION N/A 09/21/2018   Procedure: TRANSESOPHAGEAL ECHOCARDIOGRAM (TEE);  Surgeon: Acie Fredrickson Wonda Cheng, MD;  Location: [facility] ENDOSCOPY;  Service: Cardiovascular;  Laterality: N/A;   TONSILLECTOMY     TUBAL LIGATION     Patient Active Problem List   Diagnosis Date Noted   Shortness of breath    Unstable angina ([place]) 10/23/2020   Secondary hypercoagulable state ([place]) 10/15/2020   Encounter for therapeutic drug monitoring 08/17/2018   CKD (chronic kidney disease) stage 3, GFR 30-59 ml/min (HCC) 07/21/2018   Chronic anemia 07/21/2018   Anemia    Heme positive stool    Atrial fibrillation with RVR ([place]) 07/04/2018   Carotid stenosis    Bruit of left carotid artery 09/22/2017   Aortic stenosis    Heart murmur 09/09/2016   Morbid obesity ([place]) 02/05/2016   Dry eye 05/17/2015   Type 2 diabetes mellitus without complication ([place]) 123456   Breast cancer, left breast ([place]) 07/22/2014   Obesity, Class III, BMI 40-49.9 (morbid obesity) ([place]) 07/06/2014   Macular pseudohole 05/17/2014   History of surgical procedure 05/17/2014   OSA (obstructive sleep apnea) 11/17/2013   S/P lumbar spinal fusion 09/22/2013   Persistent atrial fibrillation ([place]) 09/11/2011    Class: Acute   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[place]) 09/11/2011    Class: Chronic   Essential hypertension 09/11/2011   Exogenous obesity 09/11/2011   Gastroesophageal reflux disease 09/11/2011   Thalassemia minor 09/11/2011    Class: Chronic   Breast cancer ([place]) 09/11/2011    Class: Chronic   Degenerative joint disease 09/11/2011    Class: Chronic   Irritable bowel disease  09/11/2011    Class: Chronic   Lumbar disc disease 09/11/2011    PCP: Louis Matte, MD  REFERRING PROVIDER: Parke Simmers, DO  REFERRING DIAG: chronic bag and knee pain  Rationale for Evaluation and Treatment: Rehabilitation  THERAPY DIAG:  Other low back pain  Difficulty in walking, not elsewhere classified  Muscle spasm of back  ONSET DATE: 10/14/22  SUBJECTIVE:  SUBJECTIVE STATEMENT: Patient reports that she did some gardening yesterday, reports a little more pain today  She has a history of low back surgery fusion L3-5  PERTINENT HISTORY:  See above  PAIN:  Are you having pain? 8/10 in the low back, worse with standing , walking and activity, sit and rest pain can be a 5/10 but it is always there, ache, feels weak int eh core  PRECAUTIONS: Other: lumbar fusion 2015  WEIGHT BEARING RESTRICTIONS: No  FALLS:  Has patient fallen in last 6 months? No  LIVING ENVIRONMENT: Lives with: lives with their family Lives in: House/apartment Stairs: Yes: Internal: 15 steps; can reach both Has following equipment at home: Single point cane and uses the cane for long distances grocery shopping  OCCUPATION: retired  PLOF: Independent and gardens, does Haematologist, cooks and cleans  PATIENT GOALS: strengthen core and have less pain  NEXT MD VISIT: 11/18/22  OBJECTIVE:   DIAGNOSTIC FINDINGS:  No recent x-rays   PATIENT SURVEYS:  ODI = 70% disability  COGNITION: Overall cognitive status: Within functional limits for tasks assessed     SENSATION: WFL  MUSCLE LENGTH: Very tight HS, piriformis and quads  POSTURE: rounded shoulders and forward head  PALPATION: Tender and tight in the low back mms, tightness iin the rhomboids and the upper traps  LUMBAR ROM:   AROM eval  Flexion Decreased 50%   Extension Decreased 100%  Right lateral flexion Decreased 75%  Left lateral flexion Decreased 75%  Right rotation   Left rotation    (Blank rows = not tested)  LOWER EXTREMITY ROM:     Active  Right eval Left eval  Hip flexion    Hip extension    Hip abduction    Hip adduction    Hip internal rotation    Hip external rotation    Knee flexion 10 5  Knee extension 95 100  Ankle dorsiflexion    Ankle plantarflexion    Ankle inversion    Ankle eversion     (Blank rows = not tested)  LOWER EXTREMITY MMT:    MMT Right eval Left eval  Hip flexion 4- 4-  Hip extension 4- 4-  Hip abduction 4- 4-  Hip adduction    Hip internal rotation    Hip external rotation    Knee flexion 3+ 4-  Knee extension 3+ 4-  Ankle dorsiflexion 4 4  Ankle plantarflexion    Ankle inversion    Ankle eversion     (Blank rows = not tested)  FUNCTIONAL TESTS:  3 minute walk test: 220 feet had to stop at 79minute 30 seconds ODI:  GAIT: Distance walked: 220 feet Assistive device utilized: Single point cane Level of assistance: Complete Independence Comments: stopped due to back pain  TODAY'S TREATMENT:  DATE:   11/18/22 Bike level 1 x 5 minutes Nustep level 5 x 5 minutes Calf stretches Seated rows 20# 2x10 Lats 20# LEg curls 20# 2x10 2.5# marches, Hip abduction and extension Balance on airex balance beam side stpping and tandem walking On airex ball toss Passive HS and piriformis stretches  PATIENT EDUCATION:  Education details: POC Person educated: Patient Education method: Explanation Education comprehension: verbalized understanding  HOME EXERCISE PROGRAM: TBD  ASSESSMENT:  CLINICAL IMPRESSION: Patient reports feeling better is going to try to walk for some exercise today.  She tolerated all exercises with some fatigue, piriforimis and calves are  tight OBJECTIVE IMPAIRMENTS: Abnormal gait, cardiopulmonary status limiting activity, decreased activity tolerance, decreased balance, decreased coordination, decreased endurance, decreased mobility, difficulty walking, decreased ROM, decreased strength, increased fascial restrictions, impaired perceived functional ability, increased muscle spasms, impaired flexibility, improper body mechanics, and pain.   REHAB POTENTIAL: Good  CLINICAL DECISION MAKING: Stable/uncomplicated  EVALUATION COMPLEXITY: Low   GOALS: Goals reviewed with patient? Yes  SHORT TERM GOALS: Target date: 11/24/22  Independent with initial HEP Goal status: INITIAL  LONG TERM GOALS: Target date: 02/11/23  Independent iwht posture and body mechanics Goal status: INITIAL  2.  Walk 500 feet in a 3 minute walk test Goal status: INITIAL  3.  Decrease pain 25% Goal status: INITIAL  4.  Be able to cook a meal without pain > 5/10 Goal status: INITIAL  5.  Independent with HEP or gym Goal status: INITIAL  PLAN:  PT FREQUENCY: 1-2x/week  PT DURATION: 12 weeks  PLANNED INTERVENTIONS: Therapeutic exercises, Therapeutic activity, Neuromuscular re-education, Balance training, Gait training, Patient/Family education, Self Care, Joint mobilization, Dry Needling, Electrical stimulation, Cryotherapy, Moist heat, and Manual therapy.  PLAN FOR NEXT SESSION: slowly start gym activities and flexibility   Sumner Boast, PT 11/18/2022, 8:04 AM

## 2022-11-19 DIAGNOSIS — F4323 Adjustment disorder with mixed anxiety and depressed mood: Secondary | ICD-10-CM | POA: Diagnosis not present

## 2022-11-25 DIAGNOSIS — F4323 Adjustment disorder with mixed anxiety and depressed mood: Secondary | ICD-10-CM | POA: Diagnosis not present

## 2022-11-26 DIAGNOSIS — G4709 Other insomnia: Secondary | ICD-10-CM | POA: Diagnosis not present

## 2022-11-26 DIAGNOSIS — Z6836 Body mass index (BMI) 36.0-36.9, adult: Secondary | ICD-10-CM | POA: Diagnosis not present

## 2022-11-26 DIAGNOSIS — F418 Other specified anxiety disorders: Secondary | ICD-10-CM | POA: Diagnosis not present

## 2022-11-26 DIAGNOSIS — E1169 Type 2 diabetes mellitus with other specified complication: Secondary | ICD-10-CM | POA: Diagnosis not present

## 2022-11-26 NOTE — Progress Notes (Signed)
[facility] Summary Report / Loop Recorder 

## 2022-11-27 ENCOUNTER — Ambulatory Visit: Payer: Medicare PPO | Attending: Family Medicine | Admitting: Physical Therapy

## 2022-11-27 DIAGNOSIS — M25561 Pain in right knee: Secondary | ICD-10-CM | POA: Diagnosis not present

## 2022-11-27 DIAGNOSIS — M5459 Other low back pain: Secondary | ICD-10-CM | POA: Diagnosis not present

## 2022-11-27 DIAGNOSIS — M62830 Muscle spasm of back: Secondary | ICD-10-CM | POA: Insufficient documentation

## 2022-11-27 DIAGNOSIS — R262 Difficulty in walking, not elsewhere classified: Secondary | ICD-10-CM | POA: Insufficient documentation

## 2022-11-27 NOTE — Therapy (Signed)
OUTPATIENT PHYSICAL THERAPY THORACOLUMBAR TREATMENT   Patient Name: Jenna Mcdonald MRN: [id number] DOB:15-Oct-1945, 77 y.o., female Today's Date: 11/27/2022  END OF SESSION:  PT End of Session - 11/27/22 0837     Visit Number 3    Number of Visits 13    Date for PT Re-Evaluation 01/27/23    Authorization Type [employer]    PT Start Time [phone]    PT Stop Time 0920    PT Time Calculation (min) 45 min             Past Medical History:  Diagnosis Date   Anemia    Aortic stenosis    mild by echo 09/2020 and 08/2022 with mean AVG 94mmHg   Breast CA ([place])    left   Breast cancer ([place])    Carotid stenosis    1-39% left   Degenerative disc disease, lumbar    of the knees   Diabetes mellitus    Dysrhythmia    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[place])    Encounter for therapeutic drug monitoring 08/17/2018   Fatigue    Severe secondary to to gemfibrozil   Gastroesophageal reflux disease    denies   GI bleeding    a. 06/2018: EGD showed multiple nonbleeding duodenal ulcers. 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   Hammertoe    left second toe   Hypercholesteremia    Hypertension    Insomnia    Irritable bowel    Low back pain    Metabolic syndrome    Morbid obesity (HCC)    OSA on CPAP \   bipap   Osteoarthritis    Osteopenia    Persistent atrial fibrillation [facility])    s/p afib ablation - Dr. Rayann Heman 08/2018   Personal history of radiation therapy    Pneumonia    PONV (postoperative nausea and vomiting)    Sigmoid diverticulosis    Thalassemia minor    Unstable angina ([place]) 10/23/2020   Vitamin D deficiency    Past Surgical History:  Procedure Laterality Date   ATRIAL FIBRILLATION ABLATION N/A 09/21/2018   Procedure: ATRIAL FIBRILLATION ABLATION;  Surgeon: Thompson Grayer, MD;  Location: [place] CV LAB;  Service: Cardiovascular;  Laterality: N/A;   BIOPSY  07/06/2018   Procedure: BIOPSY;  Surgeon: Clarene Essex, MD;  Location: [facility] ENDOSCOPY;  Service: Endoscopy;;   BREAST LUMPECTOMY Left 05/28/2009   BREAST SURGERY Left 2010   lumpectomy   CARDIOVERSION N/A 07/07/2018   Procedure: CARDIOVERSION;  Surgeon: [name], MD;  Location: [facility] ENDOSCOPY;  Service: Cardiovascular;  Laterality: N/A;   CARDIOVERSION N/A 07/14/2018   Procedure: CARDIOVERSION;  Surgeon: Larey Dresser, MD;  Location: [facility] ENDOSCOPY;  Service: Cardiovascular;  Laterality: N/A;   CESAREAN SECTION     COLONOSCOPY WITH PROPOFOL N/A 07/06/2018   Procedure: COLONOSCOPY WITH PROPOFOL;  Surgeon: Clarene Essex, MD;  Location: [place];  Service: Endoscopy;  Laterality: N/A;   CYSTECTOMY  1970   pilonidal    DILATION AND CURETTAGE OF UTERUS     ESOPHAGOGASTRODUODENOSCOPY (EGD) WITH PROPOFOL N/A 07/06/2018   Procedure: ESOPHAGOGASTRODUODENOSCOPY (EGD) WITH PROPOFOL;  Surgeon: Clarene Essex, MD;  Location: [place];  Service: Endoscopy;  Laterality: N/A;   EYE SURGERY Bilateral 07/2013   cataracts   HOT HEMOSTASIS N/A 07/06/2018   Procedure: HOT HEMOSTASIS (ARGON PLASMA COAGULATION/BICAP);  Surgeon: Clarene Essex, MD;  Location: [place];  Service: Endoscopy;  Laterality: N/A;   implantable loop recorder placement  01/15/2022   Medtronic Reveal Linq model [id number] implantable loop recorder (SN [id number]) implanted for AF management by Dr Rayann Heman   MAXIMUM ACCESS (MAS)POSTERIOR LUMBAR INTERBODY FUSION (PLIF) 2 LEVEL N/A 09/22/2013   Procedure: FOR MAXIMUM ACCESS  POSTERIOR LUMBAR INTERBODY FUSION LUMBAR THREE-FOUR FOUR-FIVE;  Surgeon: Eustace Moore, MD;  Location: [place] NEURO ORS;  Service: Neurosurgery;  Laterality: N/A;   OOPHORECTOMY     wedge section not rem   REPLACEMENT TOTAL KNEE Left 2011   REPLACEMENT TOTAL KNEE     RIGHT/LEFT HEART CATH AND CORONARY ANGIOGRAPHY N/A 10/26/2020   Procedure: RIGHT/LEFT HEART CATH AND CORONARY ANGIOGRAPHY;  Surgeon: Leonie Man, MD;  Location: [place] CV LAB;  Service:  Cardiovascular;  Laterality: N/A;   TEE WITHOUT CARDIOVERSION N/A 07/07/2018   Procedure: TRANSESOPHAGEAL ECHOCARDIOGRAM (TEE);  Surgeon: [name], MD;  Location: [place];  Service: Cardiovascular;  Laterality: N/A;   TEE WITHOUT CARDIOVERSION N/A 09/21/2018   Procedure: TRANSESOPHAGEAL ECHOCARDIOGRAM (TEE);  Surgeon: Thayer Headings, MD;  Location: [facility] ENDOSCOPY;  Service: Cardiovascular;  Laterality: N/A;   TONSILLECTOMY     TUBAL LIGATION     Patient Active Problem List   Diagnosis Date Noted   Shortness of breath    Unstable angina 10/23/2020   Secondary hypercoagulable state 10/15/2020   Encounter for therapeutic drug monitoring 08/17/2018   CKD (chronic kidney disease) stage 3, GFR 30-59 ml/min 07/21/2018   Chronic anemia 07/21/2018   Anemia    Heme positive stool    Atrial fibrillation with RVR 07/04/2018   Carotid stenosis    Bruit of left carotid artery 09/22/2017   Aortic stenosis    Heart murmur 09/09/2016   Morbid obesity 02/05/2016   Dry eye 05/17/2015   Type 2 diabetes mellitus without complication 123456   Breast cancer, left breast 07/22/2014   Obesity, Class III, BMI 40-49.9 (morbid obesity) ([place]) 07/06/2014   Macular pseudohole 05/17/2014   History of surgical procedure 05/17/2014   OSA (obstructive sleep apnea) 11/17/2013   S/P lumbar spinal fusion 09/22/2013   Persistent atrial fibrillation ([place]) 09/11/2011    Class: Acute   Empty sella syndrome 09/11/2011    Class: Chronic   Essential hypertension 09/11/2011   Exogenous obesity 09/11/2011   Gastroesophageal reflux disease 09/11/2011   Thalassemia minor 09/11/2011    Class: Chronic   Breast cancer 09/11/2011    Class: Chronic   Degenerative joint disease 09/11/2011    Class: Chronic   Irritable bowel disease 09/11/2011    Class: Chronic   Lumbar disc disease 09/11/2011    PCP: Louis Matte, MD  REFERRING PROVIDER: Parke Simmers, DO  REFERRING DIAG: chronic bag and knee pain  Rationale  for Evaluation and Treatment: Rehabilitation  THERAPY DIAG:  Other low back pain  Difficulty in walking, not elsewhere classified  Acute pain of right knee  ONSET DATE: 10/14/22  SUBJECTIVE:  SUBJECTIVE STATEMENT: feeling good, still can't get off ground. Walking at track PERTINENT HISTORY:  See above  PAIN:  Are you having pain? Back and knee 8/10   PRECAUTIONS: Other: lumbar fusion 2015  WEIGHT BEARING RESTRICTIONS: No  FALLS:  Has patient fallen in last 6 months? No  LIVING ENVIRONMENT: Lives with: lives with their family Lives in: House/apartment Stairs: Yes: Internal: 15 steps; can reach both Has following equipment at home: Single point cane and uses the cane for long distances grocery shopping  OCCUPATION: retired  PLOF: Independent and gardens, does Haematologist, cooks and cleans  PATIENT GOALS: strengthen core and have less pain  NEXT MD VISIT: 11/18/22  OBJECTIVE:   DIAGNOSTIC FINDINGS:  No recent x-rays   PATIENT SURVEYS:  ODI = 70% disability  COGNITION: Overall cognitive status: Within functional limits for tasks assessed     SENSATION: WFL  MUSCLE LENGTH: Very tight HS, piriformis and quads  POSTURE: rounded shoulders and forward head  PALPATION: Tender and tight in the low back mms, tightness iin the rhomboids and the upper traps  LUMBAR ROM:   AROM eval  Flexion Decreased 50%  Extension Decreased 100%  Right lateral flexion Decreased 75%  Left lateral flexion Decreased 75%  Right rotation   Left rotation    (Blank rows = not tested)  LOWER EXTREMITY ROM:     Active  Right eval Left eval  Hip flexion    Hip extension    Hip abduction    Hip adduction    Hip internal rotation    Hip external rotation    Knee flexion 10 5  Knee extension 95  100  Ankle dorsiflexion    Ankle plantarflexion    Ankle inversion    Ankle eversion     (Blank rows = not tested)  LOWER EXTREMITY MMT:    MMT Right eval Left eval  Hip flexion 4- 4-  Hip extension 4- 4-  Hip abduction 4- 4-  Hip adduction    Hip internal rotation    Hip external rotation    Knee flexion 3+ 4-  Knee extension 3+ 4-  Ankle dorsiflexion 4 4  Ankle plantarflexion    Ankle inversion    Ankle eversion     (Blank rows = not tested)  FUNCTIONAL TESTS:  3 minute walk test: 220 feet had to stop at 65minute 30 seconds ODI:  GAIT: Distance walked: 220 feet Assistive device utilized: Single point cane Level of assistance: Complete Independence Comments: stopped due to back pain  TODAY'S TREATMENT:                                                                                                                              DATE:    11/27/22 Nustep L 5 21min UBE L 4 2 min fwd/2 min back Black tband trunk flex and ext 2 sets 10 Seated Row and Lat pull 20# 2 sets 10 HS curl 20# 2  sets 10 STS from mat 10 x 3# LAQ 2 sets 10 3# marching 2 sets 10 Black bar DF and PF 2 sets 10 Amb 3 min for stamina      11/18/22 Bike level 1 x 5 minutes Nustep level 5 x 5 minutes Calf stretches Seated rows 20# 2x10 Lats 20# LEg curls 20# 2x10 2.5# marches, Hip abduction and extension Balance on airex balance beam side stpping and tandem walking On airex ball toss Passive HS and piriformis stretches  PATIENT EDUCATION:  Education details: POC Person educated: Patient Education method: Explanation Education comprehension: verbalized understanding  HOME EXERCISE PROGRAM: TBD  ASSESSMENT:  CLINICAL IMPRESSION: pt arrives stating pain as always but overall states feeling better and is very motivated so progressed ex. STG met OBJECTIVE IMPAIRMENTS: Abnormal gait, cardiopulmonary status limiting activity, decreased activity tolerance, decreased balance, decreased  coordination, decreased endurance, decreased mobility, difficulty walking, decreased ROM, decreased strength, increased fascial restrictions, impaired perceived functional ability, increased muscle spasms, impaired flexibility, improper body mechanics, and pain.   REHAB POTENTIAL: Good  CLINICAL DECISION MAKING: Stable/uncomplicated  EVALUATION COMPLEXITY: Low   GOALS: Goals reviewed with patient? Yes  SHORT TERM GOALS: Target date: 11/24/22  Independent with initial HEP Goal status: 11/27/22 MET  LONG TERM GOALS: Target date: 02/11/23  Independent iwht posture and body mechanics Goal status: INITIAL  2.  Walk 500 feet in a 3 minute walk test Goal status: INITIAL  3.  Decrease pain 25% Goal status: INITIAL  4.  Be able to cook a meal without pain > 5/10 Goal status: INITIAL  5.  Independent with HEP or gym Goal status: INITIAL  PLAN:  PT FREQUENCY: 1-2x/week  PT DURATION: 12 weeks  PLANNED INTERVENTIONS: Therapeutic exercises, Therapeutic activity, Neuromuscular re-education, Balance training, Gait training, Patient/Family education, Self Care, Joint mobilization, Dry Needling, Electrical stimulation, Cryotherapy, Moist heat, and Manual therapy.  PLAN FOR NEXT SESSION: slowly start gym activities and flexibility   Ziasia Lenoir,ANGIE, PTA 11/27/2022, 8:38 AM [facility] [place]. [place], [place], [address] Phone: [phone]   Fax:  [phone]  Patient Details  Name: Jenna Mcdonald MRN: [id number] Date of Birth: 1946/08/24 Referring Provider:  Charlane Ferretti, MD  Encounter Date: 11/27/2022

## 2022-12-04 ENCOUNTER — Ambulatory Visit: Payer: Medicare PPO | Admitting: Physical Therapy

## 2022-12-06 DIAGNOSIS — G4733 Obstructive sleep apnea (adult) (pediatric): Secondary | ICD-10-CM | POA: Diagnosis not present

## 2022-12-08 ENCOUNTER — Other Ambulatory Visit: Payer: Self-pay | Admitting: Internal Medicine

## 2022-12-08 DIAGNOSIS — I10 Essential (primary) hypertension: Secondary | ICD-10-CM | POA: Diagnosis not present

## 2022-12-08 DIAGNOSIS — Z853 Personal history of malignant neoplasm of breast: Secondary | ICD-10-CM | POA: Diagnosis not present

## 2022-12-08 DIAGNOSIS — N6321 Unspecified lump in the left breast, upper outer quadrant: Secondary | ICD-10-CM

## 2022-12-09 ENCOUNTER — Ambulatory Visit: Payer: Medicare PPO | Admitting: Physical Therapy

## 2022-12-09 ENCOUNTER — Encounter: Payer: Self-pay | Admitting: Physical Therapy

## 2022-12-09 DIAGNOSIS — R262 Difficulty in walking, not elsewhere classified: Secondary | ICD-10-CM | POA: Diagnosis not present

## 2022-12-09 DIAGNOSIS — M5459 Other low back pain: Secondary | ICD-10-CM | POA: Diagnosis not present

## 2022-12-09 DIAGNOSIS — M25561 Pain in right knee: Secondary | ICD-10-CM

## 2022-12-09 DIAGNOSIS — M62830 Muscle spasm of back: Secondary | ICD-10-CM | POA: Diagnosis not present

## 2022-12-09 NOTE — Therapy (Signed)
OUTPATIENT PHYSICAL THERAPY THORACOLUMBAR TREATMENT   Patient Name: Jenna Mcdonald MRN: [id number] DOB:07-Nov-1945, 77 y.o., female Today's Date: 12/09/2022  END OF SESSION:  PT End of Session - 12/09/22 0930     Visit Number 4    Number of Visits 13    Date for PT Re-Evaluation 01/27/23    Authorization Type [employer]    PT Start Time 0930    PT Stop Time 1015    PT Time Calculation (min) 45 min    Activity Tolerance Patient tolerated treatment well    Behavior During Therapy [facility] for tasks assessed/performed             Past Medical History:  Diagnosis Date   Anemia    Aortic stenosis    mild by echo 09/2020 and 08/2022 with mean AVG   Breast CA    left   Breast cancer    Carotid stenosis    1-39% left   Degenerative disc disease, lumbar    of the knees   Diabetes mellitus    Dysrhythmia    Empty sella syndrome    Encounter for therapeutic drug monitoring 08/17/2018   Fatigue    Severe secondary to to gemfibrozil   Gastroesophageal reflux disease    denies   GI bleeding    a. 06/2018: EGD showed multiple nonbleeding duodenal ulcers. 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   Hammertoe    left second toe   Hypercholesteremia    Hypertension    Insomnia    Irritable bowel    Low back pain    Metabolic syndrome    Morbid obesity    OSA on CPAP \   bipap   Osteoarthritis    Osteopenia    Persistent atrial fibrillation    s/p afib ablation - Dr. Johney Frame 08/2018   Personal history of radiation therapy    Pneumonia    PONV (postoperative nausea and vomiting)    Sigmoid diverticulosis    Thalassemia minor    Unstable angina 10/23/2020   Vitamin D deficiency    Past Surgical History:  Procedure Laterality Date   ATRIAL FIBRILLATION ABLATION N/A 09/21/2018   Procedure: ATRIAL FIBRILLATION ABLATION;  Surgeon: Hillis Range, MD;  Location: [facility] INVASIVE CV LAB;  Service:  Cardiovascular;  Laterality: N/A;   BIOPSY  07/06/2018   Procedure: BIOPSY;  Surgeon: Vida Rigger, MD;  Location: [facility] ENDOSCOPY;  Service: Endoscopy;;   BREAST LUMPECTOMY Left 05/28/2009   BREAST SURGERY Left 2010   lumpectomy   CARDIOVERSION N/A 07/07/2018   Procedure: CARDIOVERSION;  Surgeon: Chilton Si, MD;  Location: [facility] ENDOSCOPY;  Service: Cardiovascular;  Laterality: N/A;   CARDIOVERSION N/A 07/14/2018   Procedure: CARDIOVERSION;  Surgeon: Laurey Morale, MD;  Location: [facility] ENDOSCOPY;  Service: Cardiovascular;  Laterality: N/A;   CESAREAN SECTION     COLONOSCOPY WITH PROPOFOL N/A 07/06/2018   Procedure: COLONOSCOPY WITH PROPOFOL;  Surgeon: Vida Rigger, MD;  Location: [facility] ENDOSCOPY;  Service: Endoscopy;  Laterality: N/A;   CYSTECTOMY  1970   pilonidal    DILATION AND CURETTAGE OF UTERUS     ESOPHAGOGASTRODUODENOSCOPY (EGD) WITH PROPOFOL N/A 07/06/2018   Procedure: ESOPHAGOGASTRODUODENOSCOPY (EGD) WITH PROPOFOL;  Surgeon: Vida Rigger, MD;  Location: [facility] ENDOSCOPY;  Service: Endoscopy;  Laterality: N/A;   EYE SURGERY Bilateral 07/2013   cataracts   HOT HEMOSTASIS N/A 07/06/2018   Procedure: HOT HEMOSTASIS (ARGON PLASMA COAGULATION/BICAP);  Surgeon: Vida Rigger, MD;  Location: [facility] ENDOSCOPY;  Service: Endoscopy;  Laterality: N/A;   implantable loop recorder placement  01/15/2022   [employer]tr[employer] Reveal Linq model [id number] implantable loop recorder (SN [id number]) implanted for AF management by Dr Johney Frame   MAXIMUM ACCESS (MAS)POSTERIOR LUMBAR INTERBODY FUSION (PLIF) 2 LEVEL N/A 09/22/2013   Procedure: FOR MAXIMUM ACCESS  POSTERIOR LUMBAR INTERBODY FUSION LUMBAR THREE-FOUR FOUR-FIVE;  Surgeon: Tia Alert, MD;  Location: [facility] NEURO ORS;  Service: Neurosurgery;  Laterality: N/A;   OOPHORECTOMY     wedge section not rem   REPLACEMENT TOTAL KNEE Left 2011   REPLACEMENT TOTAL KNEE     RIGHT/LEFT HEART CATH AND CORONARY ANGIOGRAPHY N/A 10/26/2020   Procedure: RIGHT/LEFT HEART CATH AND CORONARY  ANGIOGRAPHY;  Surgeon: Marykay Lex, MD;  Location: [facility] INVASIVE CV LAB;  Service: Cardiovascular;  Laterality: N/A;   TEE WITHOUT CARDIOVERSION N/A 07/07/2018   Procedure: TRANSESOPHAGEAL ECHOCARDIOGRAM (TEE);  Surgeon: Chilton Si, MD;  Location: [facility] ENDOSCOPY;  Service: Cardiovascular;  Laterality: N/A;   TEE WITHOUT CARDIOVERSION N/A 09/21/2018   Procedure: TRANSESOPHAGEAL ECHOCARDIOGRAM (TEE);  Surgeon: Vesta Mixer, MD;  Location: [facility] ENDOSCOPY;  Service: Cardiovascular;  Laterality: N/A;   TONSILLECTOMY     TUBAL LIGATION     Patient Active Problem List   Diagnosis Date Noted   Shortness of breath    Unstable angina 10/23/2020   Secondary hypercoagulable state 10/15/2020   Encounter for therapeutic drug monitoring 08/17/2018   CKD (chronic kidney disease) stage 3, GFR 30-59 ml/min 07/21/2018   Chronic anemia 07/21/2018   Anemia    Heme positive stool    Atrial fibrillation with RVR 07/04/2018   Carotid stenosis    Bruit of left carotid artery 09/22/2017   Aortic stenosis    Heart murmur 09/09/2016   Morbid obesity 02/05/2016   Dry eye 05/17/2015   Type 2 diabetes mellitus without complication 08/10/2014   Breast cancer, left breast 07/22/2014   Obesity, Class III, BMI 40-49.9 (morbid obesity) (HCC) 07/06/2014   Macular pseudohole 05/17/2014   History of surgical procedure 05/17/2014   OSA (obstructive sleep apnea) 11/17/2013   S/P lumbar spinal fusion 09/22/2013   Persistent atrial fibrillation (HCC) 09/11/2011    Class: Acute   Empty sella syndrome 09/11/2011    Class: Chronic   Essential hypertension 09/11/2011   Exogenous obesity 09/11/2011   Gastroesophageal reflux disease 09/11/2011   Thalassemia minor 09/11/2011    Class: Chronic   Breast cancer 09/11/2011    Class: Chronic   Degenerative joint disease 09/11/2011    Class: Chronic   Irritable bowel disease 09/11/2011    Class: Chronic   Lumbar disc disease 09/11/2011    PCP: Margaretann Loveless,  MD  REFERRING PROVIDER: Baird Lyons, DO  REFERRING DIAG: chronic bag and knee pain  Rationale for Evaluation and Treatment: Rehabilitation  THERAPY DIAG:  Other low back pain  Difficulty in walking, not elsewhere classified  Acute pain of right knee  Muscle spasm of back  ONSET DATE: 10/14/22  SUBJECTIVE:  SUBJECTIVE STATEMENT:I am still feeling better, still some paina nd soreness but doing well PERTINENT HISTORY:  See above  PAIN:  Are you having pain? Back and knee 5/10   PRECAUTIONS: Other: lumbar fusion 2015  WEIGHT BEARING RESTRICTIONS: No  FALLS:  Has patient fallen in last 6 months? No  LIVING ENVIRONMENT: Lives with: lives with their family Lives in: House/apartment Stairs: Yes: Internal: 15 steps; can reach both Has following equipment at home: Single point cane and uses the cane for long distances grocery shopping  OCCUPATION: retired  PLOF: Independent and gardens, does Presenter, broadcasting, cooks and cleans  PATIENT GOALS: strengthen core and have less pain  NEXT MD VISIT: 11/18/22  OBJECTIVE:   DIAGNOSTIC FINDINGS:  No recent x-rays   PATIENT SURVEYS:  ODI = 70% disability  COGNITION: Overall cognitive status: Within functional limits for tasks assessed     SENSATION: WFL  MUSCLE LENGTH: Very tight HS, piriformis and quads  POSTURE: rounded shoulders and forward head  PALPATION: Tender and tight in the low back mms, tightness iin the rhomboids and the upper traps  LUMBAR ROM:   AROM eval 12/09/22  Flexion Decreased 50% 25%  Extension Decreased 100% Decreased 100%  Right lateral flexion Decreased 75% Decreased 75%  Left lateral flexion Decreased 75% Decreased 75%  Right rotation    Left rotation     (Blank rows = not tested)  LOWER EXTREMITY ROM:      Active  Right eval Left eval  Hip flexion    Hip extension    Hip abduction    Hip adduction    Hip internal rotation    Hip external rotation    Knee flexion 10 5  Knee extension 95 100  Ankle dorsiflexion    Ankle plantarflexion    Ankle inversion    Ankle eversion     (Blank rows = not tested)  LOWER EXTREMITY MMT:    MMT Right eval Left eval  Hip flexion 4- 4-  Hip extension 4- 4-  Hip abduction 4- 4-  Hip adduction    Hip internal rotation    Hip external rotation    Knee flexion 3+ 4-  Knee extension 3+ 4-  Ankle dorsiflexion 4 4  Ankle plantarflexion    Ankle inversion    Ankle eversion     (Blank rows = not tested)  FUNCTIONAL TESTS:  3 minute walk test: 220 feet had to stop at 30 seconds ODI:  GAIT: Distance walked: 220 feet Assistive device utilized: Single point cane Level of assistance: Complete Independence Comments: stopped due to back pain  TODAY'S TREATMENT:                                                                                                                              DATE:   12/09/22 Nustep level 5 x 10 minutes UBE level 4 x 6 minutes Black tband trunk extension and flexion Seated row 20# 2x15 Lats  20# 2x15 Calf stretches Worked on getting up and down from the floor used the large mat quadruple folded, she could kneel and get up with bed and then kneel and get up without the bed Ball squeeze with bridges Isometric abs Passive HS and piriformis stretches  11/27/22 Nustep L 5 UBE L 4 2 min fwd/2 min back Black tband trunk flex and ext 2 sets 10 Seated Row and Lat pull 20# 2 sets 10 HS curl 20# 2 sets 10 STS from mat 10 x 3# LAQ 2 sets 10 3# marching 2 sets 10 Black bar DF and PF 2 sets 10 Amb 3 min for stamina  11/18/22 Bike level 1 x 5 minutes Nustep level 5 x 5 minutes Calf stretches Seated rows 20# 2x10 Lats 20# LEg curls 20# 2x10 2.5# marches, Hip abduction and extension Balance on airex  balance beam side stpping and tandem walking On airex ball toss Passive HS and piriformis stretches  PATIENT EDUCATION:  Education details: POC Person educated: Patient Education method: Explanation Education comprehension: verbalized understanding  HOME EXERCISE PROGRAM: TBD  ASSESSMENT:  CLINICAL IMPRESSION: Patient doing very well, she reports that she did walk at the track one day and feels like she did okay.  She went on a small vacation last week.  I pushed her endurance a little today with longer times on cardio, practiced the getting up from the floor, this is difficult for her also she is very tight in the piriformis  OBJECTIVE IMPAIRMENTS: Abnormal gait, cardiopulmonary status limiting activity, decreased activity tolerance, decreased balance, decreased coordination, decreased endurance, decreased mobility, difficulty walking, decreased ROM, decreased strength, increased fascial restrictions, impaired perceived functional ability, increased muscle spasms, impaired flexibility, improper body mechanics, and pain.   REHAB POTENTIAL: Good  CLINICAL DECISION MAKING: Stable/uncomplicated  EVALUATION COMPLEXITY: Low   GOALS: Goals reviewed with patient? Yes  SHORT TERM GOALS: Target date: 11/24/22  Independent with initial HEP Goal status: 11/27/22 MET  LONG TERM GOALS: Target date: 02/11/23  Independent iwht posture and body mechanics Goal status:progressing 12/09/22  2.  Walk 500 feet in a 3 minute walk test Goal status:ongoing 12/09/22  3.  Decrease pain 25% Goal status: ongoing 12/09/22  4.  Be able to cook a meal without pain > 5/10 Goal status: INITIAL  5.  Independent with HEP or gym Goal status: INITIAL  PLAN:  PT FREQUENCY: 1-2x/week  PT DURATION: 12 weeks  PLANNED INTERVENTIONS: Therapeutic exercises, Therapeutic activity, Neuromuscular re-education, Balance training, Gait training, Patient/Family education, Self Care, Joint mobilization, Dry Needling,  Electrical stimulation, Cryotherapy, Moist heat, and Manual therapy.  PLAN FOR NEXT SESSION: continue to push her to be more active and as functional as possible   Jearld Lesch, PT 12/09/2022, 9:30 AM [facility] [address] [facility] [place], [place], [address] Phone: [phone]   Fax:  [phone]

## 2022-12-10 DIAGNOSIS — F4323 Adjustment disorder with mixed anxiety and depressed mood: Secondary | ICD-10-CM | POA: Diagnosis not present

## 2022-12-15 ENCOUNTER — Ambulatory Visit (INDEPENDENT_AMBULATORY_CARE_PROVIDER_SITE_OTHER): Payer: Medicare PPO

## 2022-12-15 DIAGNOSIS — I480 Paroxysmal atrial fibrillation: Secondary | ICD-10-CM

## 2022-12-16 ENCOUNTER — Ambulatory Visit: Payer: Medicare PPO | Admitting: Physical Therapy

## 2022-12-16 ENCOUNTER — Ambulatory Visit
Admission: RE | Admit: 2022-12-16 | Discharge: 2022-12-16 | Disposition: A | Discharge status: Home / Self Care | Payer: Medicare PPO | Source: Ambulatory Visit | Attending: Internal Medicine | Admitting: Internal Medicine

## 2022-12-16 DIAGNOSIS — M25561 Pain in right knee: Secondary | ICD-10-CM | POA: Diagnosis not present

## 2022-12-16 DIAGNOSIS — N6321 Unspecified lump in the left breast, upper outer quadrant: Secondary | ICD-10-CM

## 2022-12-16 DIAGNOSIS — R92312 Mammographic fatty tissue density, left breast: Secondary | ICD-10-CM | POA: Diagnosis not present

## 2022-12-16 DIAGNOSIS — M5459 Other low back pain: Secondary | ICD-10-CM | POA: Diagnosis not present

## 2022-12-16 DIAGNOSIS — R262 Difficulty in walking, not elsewhere classified: Secondary | ICD-10-CM | POA: Diagnosis not present

## 2022-12-16 DIAGNOSIS — M62830 Muscle spasm of back: Secondary | ICD-10-CM | POA: Diagnosis not present

## 2022-12-16 LAB — CUP PACEART REMOTE DEVICE CHECK
Date Time Interrogation Session: 20240420230326
Implantable Pulse Generator Implant Date: 20230524

## 2022-12-16 NOTE — Therapy (Signed)
OUTPATIENT PHYSICAL THERAPY THORACOLUMBAR TREATMENT   Patient Name: Jenna Mcdonald MRN: [id number] DOB:08-Sep-1945, 77 y.o., female Today's Date: 12/16/2022  END OF SESSION:  PT End of Session - 12/16/22 0845     Visit Number 5    Date for PT Re-Evaluation 01/27/23    Authorization Type [employer]    PT Start Time 0845    PT Stop Time 0930    PT Time Calculation (min) 45 min             Past Medical History:  Diagnosis Date   Anemia    Aortic stenosis    mild by echo 09/2020 and 08/2022 with mean AVG   Breast CA    left   Breast cancer    Carotid stenosis    1-39% left   Degenerative disc disease, lumbar    of the knees   Diabetes mellitus    Dysrhythmia    Empty sella syndrome    Encounter for therapeutic drug monitoring 08/17/2018   Fatigue    Severe secondary to to gemfibrozil   Gastroesophageal reflux disease    denies   GI bleeding    a. 06/2018: EGD showed multiple nonbleeding duodenal ulcers. 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   Hammertoe    left second toe   Hypercholesteremia    Hypertension    Insomnia    Irritable bowel    Low back pain    Metabolic syndrome    Morbid obesity    OSA on CPAP \   bipap   Osteoarthritis    Osteopenia    Persistent atrial fibrillation    s/p afib ablation - Dr. Johney Frame 08/2018   Personal history of radiation therapy    Pneumonia    PONV (postoperative nausea and vomiting)    Sigmoid diverticulosis    Thalassemia minor    Unstable angina 10/23/2020   Vitamin D deficiency    Past Surgical History:  Procedure Laterality Date   ATRIAL FIBRILLATION ABLATION N/A 09/21/2018   Procedure: ATRIAL FIBRILLATION ABLATION;  Surgeon: Hillis Range, MD;  Location: [facility] INVASIVE CV LAB;  Service: Cardiovascular;  Laterality: N/A;   BIOPSY  07/06/2018   Procedure: BIOPSY;  Surgeon: Vida Rigger, MD;  Location: [facility] ENDOSCOPY;  Service: Endoscopy;;    BREAST LUMPECTOMY Left 05/28/2009   BREAST SURGERY Left 2010   lumpectomy   CARDIOVERSION N/A 07/07/2018   Procedure: CARDIOVERSION;  Surgeon: Chilton Si, MD;  Location: [facility] ENDOSCOPY;  Service: Cardiovascular;  Laterality: N/A;   CARDIOVERSION N/A 07/14/2018   Procedure: CARDIOVERSION;  Surgeon: Laurey Morale, MD;  Location: [facility] ENDOSCOPY;  Service: Cardiovascular;  Laterality: N/A;   CESAREAN SECTION     COLONOSCOPY WITH PROPOFOL N/A 07/06/2018   Procedure: COLONOSCOPY WITH PROPOFOL;  Surgeon: Vida Rigger, MD;  Location: [facility] ENDOSCOPY;  Service: Endoscopy;  Laterality: N/A;   CYSTECTOMY  1970   pilonidal    DILATION AND CURETTAGE OF UTERUS     ESOPHAGOGASTRODUODENOSCOPY (EGD) WITH PROPOFOL N/A 07/06/2018   Procedure: ESOPHAGOGASTRODUODENOSCOPY (EGD) WITH PROPOFOL;  Surgeon: Vida Rigger, MD;  Location: [facility] ENDOSCOPY;  Service: Endoscopy;  Laterality: N/A;   EYE SURGERY Bilateral 07/2013   cataracts   HOT HEMOSTASIS N/A 07/06/2018   Procedure: HOT HEMOSTASIS (ARGON PLASMA COAGULATION/BICAP);  Surgeon: Vida Rigger, MD;  Location: [facility] ENDOSCOPY;  Service: Endoscopy;  Laterality: N/A;   implantable loop recorder placement  01/15/2022   Medtronic Reveal Linq model [id number] implantable loop recorder (SN [id number])  implanted for AF management by Dr Johney Frame   MAXIMUM ACCESS (MAS)POSTERIOR LUMBAR INTERBODY FUSION (PLIF) 2 LEVEL N/A 09/22/2013   Procedure: FOR MAXIMUM ACCESS  POSTERIOR LUMBAR INTERBODY FUSION LUMBAR THREE-FOUR FOUR-FIVE;  Surgeon: Tia Alert, MD;  Location: [facility] NEURO ORS;  Service: Neurosurgery;  Laterality: N/A;   OOPHORECTOMY     wedge section not rem   REPLACEMENT TOTAL KNEE Left 2011   REPLACEMENT TOTAL KNEE     RIGHT/LEFT HEART CATH AND CORONARY ANGIOGRAPHY N/A 10/26/2020   Procedure: RIGHT/LEFT HEART CATH AND CORONARY ANGIOGRAPHY;  Surgeon: Marykay Lex, MD;  Location: [facility] INVASIVE CV LAB;  Service: Cardiovascular;  Laterality: N/A;   TEE WITHOUT CARDIOVERSION N/A  07/07/2018   Procedure: TRANSESOPHAGEAL ECHOCARDIOGRAM (TEE);  Surgeon: Chilton Si, MD;  Location: [facility] ENDOSCOPY;  Service: Cardiovascular;  Laterality: N/A;   TEE WITHOUT CARDIOVERSION N/A 09/21/2018   Procedure: TRANSESOPHAGEAL ECHOCARDIOGRAM (TEE);  Surgeon: Vesta Mixer, MD;  Location: [facility] ENDOSCOPY;  Service: Cardiovascular;  Laterality: N/A;   TONSILLECTOMY     TUBAL LIGATION     Patient Active Problem List   Diagnosis Date Noted   Shortness of breath    Unstable angina 10/23/2020   Secondary hypercoagulable state 10/15/2020   Encounter for therapeutic drug monitoring 08/17/2018   CKD (chronic kidney disease) stage 3, GFR 30-59 ml/min 07/21/2018   Chronic anemia 07/21/2018   Anemia    Heme positive stool    Atrial fibrillation with RVR 07/04/2018   Carotid stenosis    Bruit of left carotid artery 09/22/2017   Aortic stenosis    Heart murmur 09/09/2016   Morbid obesity 02/05/2016   Dry eye 05/17/2015   Type 2 diabetes mellitus without complication 08/10/2014   Breast cancer, left breast 07/22/2014   Obesity, Class III, BMI 40-49.9 (morbid obesity) (HCC) 07/06/2014   Macular pseudohole 05/17/2014   History of surgical procedure 05/17/2014   OSA (obstructive sleep apnea) 11/17/2013   S/P lumbar spinal fusion 09/22/2013   Persistent atrial fibrillation (HCC) 09/11/2011    Class: Acute   Empty sella syndrome 09/11/2011    Class: Chronic   Essential hypertension 09/11/2011   Exogenous obesity 09/11/2011   Gastroesophageal reflux disease 09/11/2011   Thalassemia minor 09/11/2011    Class: Chronic   Breast cancer 09/11/2011    Class: Chronic   Degenerative joint disease 09/11/2011    Class: Chronic   Irritable bowel disease 09/11/2011    Class: Chronic   Lumbar disc disease 09/11/2011    PCP: Margaretann Loveless, MD  REFERRING PROVIDER: Baird Lyons, DO  REFERRING DIAG: chronic bag and knee pain  Rationale for Evaluation and Treatment: Rehabilitation  THERAPY DIAG:   Other low back pain  Difficulty in walking, not elsewhere classified  Acute pain of right knee  Muscle spasm of back  ONSET DATE: 10/14/22  SUBJECTIVE:  SUBJECTIVE STATEMENT:I am still feeling better, doing more. Still walking up to 2 laps .Back sore from lifting soil. Lump on left breast, going today fo rmammo PERTINENT HISTORY:  See above  PAIN:  Are you having pain? Back and knee 5/10   PRECAUTIONS: Other: lumbar fusion 2015  WEIGHT BEARING RESTRICTIONS: No  FALLS:  Has patient fallen in last 6 months? No  LIVING ENVIRONMENT: Lives with: lives with their family Lives in: House/apartment Stairs: Yes: Internal: 15 steps; can reach both Has following equipment at home: Single point cane and uses the cane for long distances grocery shopping  OCCUPATION: retired  PLOF: Independent and gardens, does Presenter, broadcasting, cooks and cleans  PATIENT GOALS: strengthen core and have less pain  NEXT MD VISIT: 11/18/22  OBJECTIVE:   DIAGNOSTIC FINDINGS:  No recent x-rays   PATIENT SURVEYS:  ODI = 70% disability  COGNITION: Overall cognitive status: Within functional limits for tasks assessed     SENSATION: WFL  MUSCLE LENGTH: Very tight HS, piriformis and quads  POSTURE: rounded shoulders and forward head  PALPATION: Tender and tight in the low back mms, tightness iin the rhomboids and the upper traps  LUMBAR ROM:   AROM eval 12/09/22  Flexion Decreased 50% 25%  Extension Decreased 100% Decreased 100%  Right lateral flexion Decreased 75% Decreased 75%  Left lateral flexion Decreased 75% Decreased 75%  Right rotation    Left rotation     (Blank rows = not tested)  LOWER EXTREMITY ROM:     Active  Right eval Left eval  Hip flexion    Hip extension    Hip abduction    Hip  adduction    Hip internal rotation    Hip external rotation    Knee flexion 10 5  Knee extension 95 100  Ankle dorsiflexion    Ankle plantarflexion    Ankle inversion    Ankle eversion     (Blank rows = not tested)  LOWER EXTREMITY MMT:    MMT Right eval Left eval  Hip flexion 4- 4-  Hip extension 4- 4-  Hip abduction 4- 4-  Hip adduction    Hip internal rotation    Hip external rotation    Knee flexion 3+ 4-  Knee extension 3+ 4-  Ankle dorsiflexion 4 4  Ankle plantarflexion    Ankle inversion    Ankle eversion     (Blank rows = not tested)  FUNCTIONAL TESTS:  3 minute walk test: 220 feet had to stop at 30 seconds ODI:  GAIT: Distance walked: 220 feet Assistive device utilized: Single point cane Level of assistance: Complete Independence Comments: stopped due to back pain  TODAY'S TREATMENT:                                                                                                                              DATE:    12/16/22 Nustep L 5 UBE L4 6 min Black  tband trunk extension and flexion 20 x Seated row 20# 2x15 Lats 20# 2x15 Farmers carry 7# 2 laps each hand STS with wt ball press 12 x Wt ball OH ext 10 x Wt ball trunk rotation 10 x each    12/09/22 Nustep level 5 x 10 minutes UBE level 4 x 6 minutes Black tband trunk extension and flexion Seated row 20# 2x15 Lats 20# 2x15 Calf stretches Worked on getting up and down from the floor used the large mat quadruple folded, she could kneel and get up with bed and then kneel and get up without the bed Ball squeeze with bridges Isometric abs Passive HS and piriformis stretches  11/27/22 Nustep L 5 UBE L 4 2 min fwd/2 min back Black tband trunk flex and ext 2 sets 10 Seated Row and Lat pull 20# 2 sets 10 HS curl 20# 2 sets 10 STS from mat 10 x 3# LAQ 2 sets 10 3# marching 2 sets 10 Black bar DF and PF 2 sets 10 Amb 3 min for stamina  11/18/22 Bike level 1 x 5  minutes Nustep level 5 x 5 minutes Calf stretches Seated rows 20# 2x10 Lats 20# LEg curls 20# 2x10 2.5# marches, Hip abduction and extension Balance on airex balance beam side stpping and tandem walking On airex ball toss Passive HS and piriformis stretches  PATIENT EDUCATION:  Education details: POC Person educated: Patient Education method: Explanation Education comprehension: verbalized understanding  HOME EXERCISE PROGRAM: TBD  ASSESSMENT:  CLINICAL IMPRESSION: Pt still reports doing well overall and has increased her walking. Pt states more active but did hurt back lifting soil. Continued to work on progressing core strength and stamina- 16 min cardio without rest   OBJECTIVE IMPAIRMENTS: Abnormal gait, cardiopulmonary status limiting activity, decreased activity tolerance, decreased balance, decreased coordination, decreased endurance, decreased mobility, difficulty walking, decreased ROM, decreased strength, increased fascial restrictions, impaired perceived functional ability, increased muscle spasms, impaired flexibility, improper body mechanics, and pain.   REHAB POTENTIAL: Good  CLINICAL DECISION MAKING: Stable/uncomplicated  EVALUATION COMPLEXITY: Low   GOALS: Goals reviewed with patient? Yes  SHORT TERM GOALS: Target date: 11/24/22  Independent with initial HEP Goal status: 11/27/22 MET  LONG TERM GOALS: Target date: 02/11/23  Independent iwht posture and body mechanics Goal status:progressing 12/09/22 and 12/16/22  2.  Walk 500 feet in a 3 minute walk test Goal status:ongoing 12/09/22  3.  Decrease pain 25% Goal status: ongoing 12/09/22  4.  Be able to cook a meal without pain > 5/10 Goal status: progressing 12/16/22  5.  Independent with HEP or gym Goal status: progressing 12/16/22  PLAN:  PT FREQUENCY: 1-2x/week  PT DURATION: 12 weeks  PLANNED INTERVENTIONS: Therapeutic exercises, Therapeutic activity, Neuromuscular re-education, Balance training,  Gait training, Patient/Family education, Self Care, Joint mobilization, Dry Needling, Electrical stimulation, Cryotherapy, Moist heat, and Manual therapy.  PLAN FOR NEXT SESSION: continue to push her to be more active and as functional as possible   Rowin Bayron,ANGIE, PTA 12/16/2022, 8:45 AM [facility] at [facility] [address] [facility]. [place], [place], [address] Phone: [phone]   Fax:  [phone] at [facility] [address]. [place], [place], [address] Phone: [phone]   Fax:  [phone]  Patient Details  Name: Jenna Mcdonald MRN: [id number] Date of Birth: June 21, 1946 Referring Provider:  Thana Ates, MD  Encounter Date: 12/16/2022   Suanne Marker, PTA 12/16/2022, 8:45 AM  [place]  [facility] at [place]. [place], [place], [address] Phone: [phone]   Fax:  [phone]

## 2022-12-23 ENCOUNTER — Ambulatory Visit: Payer: Medicare PPO | Admitting: Physical Therapy

## 2022-12-23 DIAGNOSIS — R262 Difficulty in walking, not elsewhere classified: Secondary | ICD-10-CM

## 2022-12-23 DIAGNOSIS — M25561 Pain in right knee: Secondary | ICD-10-CM | POA: Diagnosis not present

## 2022-12-23 DIAGNOSIS — M5459 Other low back pain: Secondary | ICD-10-CM | POA: Diagnosis not present

## 2022-12-23 DIAGNOSIS — M62830 Muscle spasm of back: Secondary | ICD-10-CM | POA: Diagnosis not present

## 2022-12-23 NOTE — Therapy (Signed)
OUTPATIENT PHYSICAL THERAPY THORACOLUMBAR TREATMENT   Patient Name: Jenna Mcdonald MRN: [id number] DOB:Jun 28, 1946, 77 y.o., female Today's Date: 12/23/2022  END OF SESSION:  PT End of Session - 12/23/22 0835     Visit Number 6    Number of Visits 13    Date for PT Re-Evaluation 01/27/23    Authorization Type [employer]    PT Start Time 0840    PT Stop Time 0920    PT Time Calculation (min) 40 min             Past Medical History:  Diagnosis Date   Anemia    Aortic stenosis    mild by echo 09/2020 and 08/2022 with mean AVG   Breast CA (HCC)    left   Breast cancer (HCC)    Carotid stenosis    1-39% left   Degenerative disc disease, lumbar    of the knees   Diabetes mellitus    Dysrhythmia    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HCC)    Encounter for therapeutic drug monitoring 08/17/2018   Fatigue    Severe secondary to to gemfibrozil   Gastroesophageal reflux disease    denies   GI bleeding    a. 06/2018: EGD showed multiple nonbleeding duodenal ulcers. 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   Hammertoe    left second toe   Hypercholesteremia    Hypertension    Insomnia    Irritable bowel    Low back pain    Metabolic syndrome    Morbid obesity (HCC)    OSA on CPAP \   bipap   Osteoarthritis    Osteopenia    Persistent atrial fibrillation [facility])    s/p afib ablation - Dr. Johney Frame 08/2018   Personal history of radiation therapy    Pneumonia    PONV (postoperative nausea and vomiting)    Sigmoid diverticulosis    Thalassemia minor    Unstable angina (HCC) 10/23/2020   Vitamin D deficiency    Past Surgical History:  Procedure Laterality Date   ATRIAL FIBRILLATION ABLATION N/A 09/21/2018   Procedure: ATRIAL FIBRILLATION ABLATION;  Surgeon: Hillis Range, MD;  Location: [facility] INVASIVE CV LAB;  Service: Cardiovascular;  Laterality: N/A;   BIOPSY  07/06/2018   Procedure: BIOPSY;  Surgeon: Vida Rigger, MD;  Location: [facility] ENDOSCOPY;  Service: Endoscopy;;   BREAST LUMPECTOMY Left 05/28/2009   BREAST SURGERY Left 2010   lumpectomy   CARDIOVERSION N/A 07/07/2018   Procedure: CARDIOVERSION;  Surgeon: Chilton Si, MD;  Location: [facility] ENDOSCOPY;  Service: Cardiovascular;  Laterality: N/A;   CARDIOVERSION N/A 07/14/2018   Procedure: CARDIOVERSION;  Surgeon: Laurey Morale, MD;  Location: [facility] ENDOSCOPY;  Service: Cardiovascular;  Laterality: N/A;   CESAREAN SECTION     COLONOSCOPY WITH PROPOFOL N/A 07/06/2018   Procedure: COLONOSCOPY WITH PROPOFOL;  Surgeon: Vida Rigger, MD;  Location: [facility] ENDOSCOPY;  Service: Endoscopy;  Laterality: N/A;   CYSTECTOMY  1970   pilonidal    DILATION AND CURETTAGE OF UTERUS     ESOPHAGOGASTRODUODENOSCOPY (EGD) WITH PROPOFOL N/A 07/06/2018   Procedure: ESOPHAGOGASTRODUODENOSCOPY (EGD) WITH PROPOFOL;  Surgeon: Vida Rigger, MD;  Location: [facility] ENDOSCOPY;  Service: Endoscopy;  Laterality: N/A;   EYE SURGERY Bilateral 07/2013   cataracts   HOT HEMOSTASIS N/A 07/06/2018   Procedure: HOT HEMOSTASIS (ARGON PLASMA COAGULATION/BICAP);  Surgeon: Vida Rigger, MD;  Location: [facility] ENDOSCOPY;  Service: Endoscopy;  Laterality: N/A;   implantable loop recorder placement  01/15/2022   [employer]tr[employer] Reveal Linq model [id number] implantable loop recorder (SN [id number]) implanted for AF management by Dr Johney Frame   MAXIMUM ACCESS (MAS)POSTERIOR LUMBAR INTERBODY FUSION (PLIF) 2 LEVEL N/A 09/22/2013   Procedure: FOR MAXIMUM ACCESS  POSTERIOR LUMBAR INTERBODY FUSION LUMBAR THREE-FOUR FOUR-FIVE;  Surgeon: Tia Alert, MD;  Location: [facility] NEURO ORS;  Service: Neurosurgery;  Laterality: N/A;   OOPHORECTOMY     wedge section not rem   REPLACEMENT TOTAL KNEE Left 2011   REPLACEMENT TOTAL KNEE     RIGHT/LEFT HEART CATH AND CORONARY ANGIOGRAPHY N/A 10/26/2020   Procedure: RIGHT/LEFT HEART CATH AND CORONARY ANGIOGRAPHY;  Surgeon: Marykay Lex, MD;  Location: [facility] INVASIVE CV LAB;  Service:  Cardiovascular;  Laterality: N/A;   TEE WITHOUT CARDIOVERSION N/A 07/07/2018   Procedure: TRANSESOPHAGEAL ECHOCARDIOGRAM (TEE);  Surgeon: Chilton Si, MD;  Location: [facility] ENDOSCOPY;  Service: Cardiovascular;  Laterality: N/A;   TEE WITHOUT CARDIOVERSION N/A 09/21/2018   Procedure: TRANSESOPHAGEAL ECHOCARDIOGRAM (TEE);  Surgeon: Elease Hashimoto Deloris Ping, MD;  Location: [facility] ENDOSCOPY;  Service: Cardiovascular;  Laterality: N/A;   TONSILLECTOMY     TUBAL LIGATION     Patient Active Problem List   Diagnosis Date Noted   Shortness of breath    Unstable angina (HCC) 10/23/2020   Secondary hypercoagulable state (HCC) 10/15/2020   Encounter for therapeutic drug monitoring 08/17/2018   CKD (chronic kidney disease) stage 3, GFR 30-59 ml/min (HCC) 07/21/2018   Chronic anemia 07/21/2018   Anemia    Heme positive stool    Atrial fibrillation with RVR (HCC) 07/04/2018   Carotid stenosis    Bruit of left carotid artery 09/22/2017   Aortic stenosis    Heart murmur 09/09/2016   Morbid obesity (HCC) 02/05/2016   Dry eye 05/17/2015   Type 2 diabetes mellitus without complication (HCC) 08/10/2014   Breast cancer, left breast (HCC) 07/22/2014   Obesity, Class III, BMI 40-49.9 (morbid obesity) (HCC) 07/06/2014   Macular pseudohole 05/17/2014   History of surgical procedure 05/17/2014   OSA (obstructive sleep apnea) 11/17/2013   S/P lumbar spinal fusion 09/22/2013   Persistent atrial fibrillation (HCC) 09/11/2011    Class: Acute   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HCC) 09/11/2011    Class: Chronic   Essential hypertension 09/11/2011   Exogenous obesity 09/11/2011   Gastroesophageal reflux disease 09/11/2011   Thalassemia minor 09/11/2011    Class: Chronic   Breast cancer (HCC) 09/11/2011    Class: Chronic   Degenerative joint disease 09/11/2011    Class: Chronic   Irritable bowel disease 09/11/2011    Class: Chronic   Lumbar disc disease 09/11/2011    PCP: Margaretann Loveless, MD  REFERRING PROVIDER: Baird Lyons,  DO  REFERRING DIAG: chronic bag and knee pain  Rationale for Evaluation and Treatment: Rehabilitation  THERAPY DIAG:  Other low back pain  Difficulty in walking, not elsewhere classified  Acute pain of right knee  ONSET DATE: 10/14/22  SUBJECTIVE:  SUBJECTIVE STATEMENT: almost cancelled, back and RT knee hurting. Planting ,moving pots and pressure washing drive  4 way 15 x eachhas increased pain   PERTINENT HISTORY:  See above  PAIN:  Are you having pain? Back and knee 7/10   PRECAUTIONS: Other: lumbar fusion 2015  WEIGHT BEARING RESTRICTIONS: No  FALLS:  Has patient fallen in last 6 months? No  LIVING ENVIRONMENT: Lives with: lives with their family Lives in: House/apartment Stairs: Yes: Internal: 15 steps; can reach both Has following equipment at home: Single point cane and uses the cane for long distances grocery shopping  OCCUPATION: retired  PLOF: Independent and gardens, does Presenter, broadcasting, cooks and cleans  PATIENT GOALS: strengthen core and have less pain  NEXT MD VISIT: 11/18/22  OBJECTIVE:   DIAGNOSTIC FINDINGS:  No recent x-rays   PATIENT SURVEYS:  ODI = 70% disability  COGNITION: Overall cognitive status: Within functional limits for tasks assessed     SENSATION: WFL  MUSCLE LENGTH: Very tight HS, piriformis and quads  POSTURE: rounded shoulders and forward head  PALPATION: Tender and tight in the low back mms, tightness iin the rhomboids and the upper traps  LUMBAR ROM:   AROM eval 12/09/22  Flexion Decreased 50% 25%  Extension Decreased 100% Decreased 100%  Right lateral flexion Decreased 75% Decreased 75%  Left lateral flexion Decreased 75% Decreased 75%  Right rotation    Left rotation     (Blank rows = not tested)  LOWER EXTREMITY ROM:      Active  Right eval Left eval  Hip flexion    Hip extension    Hip abduction    Hip adduction    Hip internal rotation    Hip external rotation    Knee flexion 10 5  Knee extension 95 100  Ankle dorsiflexion    Ankle plantarflexion    Ankle inversion    Ankle eversion     (Blank rows = not tested)  LOWER EXTREMITY MMT:    MMT Right eval Left eval  Hip flexion 4- 4-  Hip extension 4- 4-  Hip abduction 4- 4-  Hip adduction    Hip internal rotation    Hip external rotation    Knee flexion 3+ 4-  Knee extension 3+ 4-  Ankle dorsiflexion 4 4  Ankle plantarflexion    Ankle inversion    Ankle eversion     (Blank rows = not tested)  FUNCTIONAL TESTS:  3 minute walk test: 220 feet had to stop at 30 seconds ODI:  GAIT: Distance walked: 220 feet Assistive device utilized: Single point cane Level of assistance: Complete Independence Comments: stopped due to back pain  TODAY'S TREATMENT:                                                                                                                              DATE:    12/23/22 Amb outside 6 min, slower than normal d/t pain Sitting  on dynadisc pelvic ROM 15 x 4 way Red tband scap stab on dynadisc 4 ways 15 x each Feet on ball bridge, KTC, obl, iso abdominals PROM LE and trunk- gentle  12/16/22 Nustep L 5 UBE L4 6 min Black tband trunk extension and flexion 20 x Seated row 20# 2x15 Lats 20# 2x15 Farmers carry 7# 2 laps each hand STS with wt ball press 12 x Wt ball OH ext 10 x Wt ball trunk rotation 10 x each    12/09/22 Nustep level 5 x 10 minutes UBE level 4 x 6 minutes Black tband trunk extension and flexion Seated row 20# 2x15 Lats 20# 2x15 Calf stretches Worked on getting up and down from the floor used the large mat quadruple folded, she could kneel and get up with bed and then kneel and get up without the bed Ball squeeze with bridges Isometric abs Passive HS and piriformis  stretches  11/27/22 Nustep L 5 UBE L 4 2 min fwd/2 min back Black tband trunk flex and ext 2 sets 10 Seated Row and Lat pull 20# 2 sets 10 HS curl 20# 2 sets 10 STS from mat 10 x 3# LAQ 2 sets 10 3# marching 2 sets 10 Black bar DF and PF 2 sets 10 Amb 3 min for stamina  11/18/22 Bike level 1 x 5 minutes Nustep level 5 x 5 minutes Calf stretches Seated rows 20# 2x10 Lats 20# LEg curls 20# 2x10 2.5# marches, Hip abduction and extension Balance on airex balance beam side stpping and tandem walking On airex ball toss Passive HS and piriformis stretches  PATIENT EDUCATION:  Education details: POC Person educated: Patient Education method: Explanation Education comprehension: verbalized understanding  HOME EXERCISE PROGRAM: TBD  ASSESSMENT:  CLINICAL IMPRESSION: pt arrived with increased LBP and RT knee after doing a lot of planting and pressure washing drive. Modified session d/t pain and spent time on Body mech to decrease stress/strain on back and knees and she VU. No goals met this week. OBJECTIVE IMPAIRMENTS: Abnormal gait, cardiopulmonary status limiting activity, decreased activity tolerance, decreased balance, decreased coordination, decreased endurance, decreased mobility, difficulty walking, decreased ROM, decreased strength, increased fascial restrictions, impaired perceived functional ability, increased muscle spasms, impaired flexibility, improper body mechanics, and pain.   REHAB POTENTIAL: Good  CLINICAL DECISION MAKING: Stable/uncomplicated  EVALUATION COMPLEXITY: Low   GOALS: Goals reviewed with patient? Yes  SHORT TERM GOALS: Target date: 11/24/22  Independent with initial HEP Goal status: 11/27/22 MET  LONG TERM GOALS: Target date: 02/11/23  Independent iwht posture and body mechanics Goal status:progressing 12/09/22 and 12/16/22  2.  Walk 500 feet in a 3 minute walk test Goal status:ongoing 12/09/22  3.  Decrease pain 25% Goal status: ongoing  12/09/22  4.  Be able to cook a meal without pain > 5/10 Goal status: progressing 12/16/22  5.  Independent with HEP or gym Goal status: progressing 12/16/22  PLAN:  PT FREQUENCY: 1-2x/week  PT DURATION: 12 weeks  PLANNED INTERVENTIONS: Therapeutic exercises, Therapeutic activity, Neuromuscular re-education, Balance training, Gait training, Patient/Family education, Self Care, Joint mobilization, Dry Needling, Electrical stimulation, Cryotherapy, Moist heat, and Manual therapy.  PLAN FOR NEXT SESSION: continue to push her to be more active and as functional as possible   Javaughn Opdahl,ANGIE, PTA 12/23/2022, 9:35 AM [place] [facility] at [facility] [facility]. [place], [place], [address] Phone: [phone]   Fax:  [phone] [address] [facility]  [address] [place], [place], [address] Phone: [phone]   Fax:  [phone]  Patient Details  Name: Jenna Mcdonald MRN: [id number] Date of Birth: 1945/12/01 Referring Provider:  Thana Ates, MD  Encounter Date: 12/23/2022

## 2022-12-23 NOTE — Progress Notes (Signed)
[facility] Summary Report / Loop Recorder 

## 2022-12-24 DIAGNOSIS — F4323 Adjustment disorder with mixed anxiety and depressed mood: Secondary | ICD-10-CM | POA: Diagnosis not present

## 2022-12-30 ENCOUNTER — Ambulatory Visit: Payer: Medicare PPO | Attending: Family Medicine | Admitting: Physical Therapy

## 2022-12-30 ENCOUNTER — Encounter: Payer: Self-pay | Admitting: Physical Therapy

## 2022-12-30 DIAGNOSIS — M25561 Pain in right knee: Secondary | ICD-10-CM | POA: Diagnosis not present

## 2022-12-30 DIAGNOSIS — M5459 Other low back pain: Secondary | ICD-10-CM | POA: Diagnosis not present

## 2022-12-30 DIAGNOSIS — M62830 Muscle spasm of back: Secondary | ICD-10-CM | POA: Diagnosis not present

## 2022-12-30 DIAGNOSIS — R262 Difficulty in walking, not elsewhere classified: Secondary | ICD-10-CM | POA: Diagnosis not present

## 2022-12-30 NOTE — Therapy (Signed)
OUTPATIENT PHYSICAL THERAPY THORACOLUMBAR TREATMENT   Patient Name: Jenna Mcdonald MRN: [id number] DOB:[birth year], 77 y.o., female Today's Date: 12/30/2022  END OF SESSION:  PT End of Session - 12/30/22 0931     Visit Number 7    Number of Visits 13    Date for PT Re-Evaluation 01/27/23    Authorization Type [employer]    PT Start Time 0930    PT Stop Time 1015    PT Time Calculation (min) 45 min    Activity Tolerance Patient tolerated treatment well    Behavior During Therapy [facility] for tasks assessed/performed             Past Medical History:  Diagnosis Date   Anemia    Aortic stenosis    mild by echo 09/2020 and 08/2022 with mean AVG   Breast CA (HCC)    left   Breast cancer (HCC)    Carotid stenosis    1-39% left   Degenerative disc disease, lumbar    of the knees   Diabetes mellitus    Dysrhythmia    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HCC)    Encounter for therapeutic drug monitoring 08/17/2018   Fatigue    Severe secondary to to gemfibrozil   Gastroesophageal reflux disease    denies   GI bleeding    a. 06/2018: EGD showed multiple nonbleeding duodenal ulcers. 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   Hammertoe    left second toe   Hypercholesteremia    Hypertension    Insomnia    Irritable bowel    Low back pain    Metabolic syndrome    Morbid obesity (HCC)    OSA on CPAP \   bipap   Osteoarthritis    Osteopenia    Persistent atrial fibrillation [facility])    s/p afib ablation - Dr. Johney Frame 08/2018   Personal history of radiation therapy    Pneumonia    PONV (postoperative nausea and vomiting)    Sigmoid diverticulosis    Thalassemia minor    Unstable angina (HCC) 10/23/2020   Vitamin D deficiency    Past Surgical History:  Procedure Laterality Date   ATRIAL FIBRILLATION ABLATION N/A 09/21/2018   Procedure: ATRIAL FIBRILLATION ABLATION;  Surgeon: Hillis Range, MD;  Location: [facility]  INVASIVE CV LAB;  Service: Cardiovascular;  Laterality: N/A;   BIOPSY  07/06/2018   Procedure: BIOPSY;  Surgeon: Vida Rigger, MD;  Location: [facility] ENDOSCOPY;  Service: Endoscopy;;   BREAST LUMPECTOMY Left 05/28/2009   BREAST SURGERY Left 2010   lumpectomy   CARDIOVERSION N/A 07/07/2018   Procedure: CARDIOVERSION;  Surgeon: Chilton Si, MD;  Location: [facility] ENDOSCOPY;  Service: Cardiovascular;  Laterality: N/A;   CARDIOVERSION N/A 07/14/2018   Procedure: CARDIOVERSION;  Surgeon: Laurey Morale, MD;  Location: [facility] ENDOSCOPY;  Service: Cardiovascular;  Laterality: N/A;   CESAREAN SECTION     COLONOSCOPY WITH PROPOFOL N/A 07/06/2018   Procedure: COLONOSCOPY WITH PROPOFOL;  Surgeon: Vida Rigger, MD;  Location: [facility] ENDOSCOPY;  Service: Endoscopy;  Laterality: N/A;   CYSTECTOMY  1970   pilonidal    DILATION AND CURETTAGE OF UTERUS     ESOPHAGOGASTRODUODENOSCOPY (EGD) WITH PROPOFOL N/A 07/06/2018   Procedure: ESOPHAGOGASTRODUODENOSCOPY (EGD) WITH PROPOFOL;  Surgeon: Vida Rigger, MD;  Location: [facility] ENDOSCOPY;  Service: Endoscopy;  Laterality: N/A;   EYE SURGERY Bilateral 07/2013   cataracts   HOT HEMOSTASIS N/A 07/06/2018   Procedure: HOT HEMOSTASIS (ARGON PLASMA COAGULATION/BICAP);  Surgeon: Ewing Schlein,  Vernia Buff, MD;  Location: [facility] ENDOSCOPY;  Service: Endoscopy;  Laterality: N/A;   implantable loop recorder placement  01/15/2022   Medtronic Reveal Linq model [id number] implantable loop recorder (SN [id number]) implanted for AF management by Dr Johney Frame   MAXIMUM ACCESS (MAS)POSTERIOR LUMBAR INTERBODY FUSION (PLIF) 2 LEVEL N/A 09/22/2013   Procedure: FOR MAXIMUM ACCESS  POSTERIOR LUMBAR INTERBODY FUSION LUMBAR THREE-FOUR FOUR-FIVE;  Surgeon: Tia Alert, MD;  Location: [facility] NEURO ORS;  Service: Neurosurgery;  Laterality: N/A;   OOPHORECTOMY     wedge section not rem   REPLACEMENT TOTAL KNEE Left 2011   REPLACEMENT TOTAL KNEE     RIGHT/LEFT HEART CATH AND CORONARY ANGIOGRAPHY N/A 10/26/2020   Procedure:  RIGHT/LEFT HEART CATH AND CORONARY ANGIOGRAPHY;  Surgeon: Marykay Lex, MD;  Location: [facility] INVASIVE CV LAB;  Service: Cardiovascular;  Laterality: N/A;   TEE WITHOUT CARDIOVERSION N/A 07/07/2018   Procedure: TRANSESOPHAGEAL ECHOCARDIOGRAM (TEE);  Surgeon: Chilton Si, MD;  Location: [facility] ENDOSCOPY;  Service: Cardiovascular;  Laterality: N/A;   TEE WITHOUT CARDIOVERSION N/A 09/21/2018   Procedure: TRANSESOPHAGEAL ECHOCARDIOGRAM (TEE);  Surgeon: Elease Hashimoto Deloris Ping, MD;  Location: [facility] ENDOSCOPY;  Service: Cardiovascular;  Laterality: N/A;   TONSILLECTOMY     TUBAL LIGATION     Patient Active Problem List   Diagnosis Date Noted   Shortness of breath    Unstable angina (HCC) 10/23/2020   Secondary hypercoagulable state (HCC) 10/15/2020   Encounter for therapeutic drug monitoring 08/17/2018   CKD (chronic kidney disease) stage 3, GFR 30-59 ml/min (HCC) 07/21/2018   Chronic anemia 07/21/2018   Anemia    Heme positive stool    Atrial fibrillation with RVR (HCC) 07/04/2018   Carotid stenosis    Bruit of left carotid artery 09/22/2017   Aortic stenosis    Heart murmur 09/09/2016   Morbid obesity (HCC) 02/05/2016   Dry eye 05/17/2015   Type 2 diabetes mellitus without complication (HCC) 08/10/2014   Breast cancer, left breast (HCC) 07/22/2014   Obesity, Class III, BMI 40-49.9 (morbid obesity) (HCC) 07/06/2014   Macular pseudohole 05/17/2014   History of surgical procedure 05/17/2014   OSA (obstructive sleep apnea) 11/17/2013   S/P lumbar spinal fusion 09/22/2013   Persistent atrial fibrillation (HCC) 09/11/2011    Class: Acute   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HCC) 09/11/2011    Class: Chronic   Essential hypertension 09/11/2011   Exogenous obesity 09/11/2011   Gastroesophageal reflux disease 09/11/2011   Thalassemia minor 09/11/2011    Class: Chronic   Breast cancer (HCC) 09/11/2011    Class: Chronic   Degenerative joint disease 09/11/2011    Class: Chronic   Irritable bowel disease  09/11/2011    Class: Chronic   Lumbar disc disease 09/11/2011    PCP: Margaretann Loveless, MD  REFERRING PROVIDER: Baird Lyons, DO  REFERRING DIAG: chronic bag and knee pain  Rationale for Evaluation and Treatment: Rehabilitation  THERAPY DIAG:  Other low back pain  Difficulty in walking, not elsewhere classified  Acute pain of right knee  Muscle spasm of back  ONSET DATE: 10/14/22  SUBJECTIVE:  SUBJECTIVE STATEMENT: Still with some knee pain and c/o being very tired   PERTINENT HISTORY:  See above  PAIN:  Are you having pain? Back and knee 7/10   PRECAUTIONS: Other: lumbar fusion 2015  WEIGHT BEARING RESTRICTIONS: No  FALLS:  Has patient fallen in last 6 months? No  LIVING ENVIRONMENT: Lives with: lives with their family Lives in: House/apartment Stairs: Yes: Internal: 15 steps; can reach both Has following equipment at home: Single point cane and uses the cane for long distances grocery shopping  OCCUPATION: retired  PLOF: Independent and gardens, does Presenter, broadcasting, cooks and cleans  PATIENT GOALS: strengthen core and have less pain  NEXT MD VISIT: 11/18/22  OBJECTIVE:   DIAGNOSTIC FINDINGS:  No recent x-rays   PATIENT SURVEYS:  ODI = 70% disability  COGNITION: Overall cognitive status: Within functional limits for tasks assessed     SENSATION: WFL  MUSCLE LENGTH: Very tight HS, piriformis and quads  POSTURE: rounded shoulders and forward head  PALPATION: Tender and tight in the low back mms, tightness iin the rhomboids and the upper traps  LUMBAR ROM:   AROM eval 12/09/22  Flexion Decreased 50% 25%  Extension Decreased 100% Decreased 100%  Right lateral flexion Decreased 75% Decreased 75%  Left lateral flexion Decreased 75% Decreased 75%  Right rotation    Left  rotation     (Blank rows = not tested)  LOWER EXTREMITY ROM:     Active  Right eval Left eval  Hip flexion    Hip extension    Hip abduction    Hip adduction    Hip internal rotation    Hip external rotation    Knee flexion 10 5  Knee extension 95 100  Ankle dorsiflexion    Ankle plantarflexion    Ankle inversion    Ankle eversion     (Blank rows = not tested)  LOWER EXTREMITY MMT:    MMT Right eval Left eval  Hip flexion 4- 4-  Hip extension 4- 4-  Hip abduction 4- 4-  Hip adduction    Hip internal rotation    Hip external rotation    Knee flexion 3+ 4-  Knee extension 3+ 4-  Ankle dorsiflexion 4 4  Ankle plantarflexion    Ankle inversion    Ankle eversion     (Blank rows = not tested)  FUNCTIONAL TESTS:  3 minute walk test: 220 feet had to stop at 30 seconds ODI:  GAIT: Distance walked: 220 feet Assistive device utilized: Single point cane Level of assistance: Complete Independence Comments: stopped due to back pain  TODAY'S TREATMENT:                                                                                                                              DATE:   12/30/22 Nustep level 4 x 9 minutes 5# straight arm pulls with cues for core and posture 20# 2x10 triceps 5# biceps 2x10 Feet  on ball K2C, rotation, bridge, iso abs Passive stretch of the HS and piriformis Pateint was frustrated about the lack of weight loss, we talked about her choices and the weight that she has lost is sustainable and what she can do to keep up the momentum that she has  12/23/22 Amb outside 6 min, slower than normal d/t pain Sitting on dynadisc pelvic ROM 15 x 4 way Red tband scap stab on dynadisc 4 ways 15 x each Feet on ball bridge, KTC, obl, iso abdominals PROM LE and trunk- gentle  12/16/22 Nustep L 5 UBE L4 6 min Black tband trunk extension and flexion 20 x Seated row 20# 2x15 Lats 20# 2x15 Farmers carry 7# 2 laps each hand STS with wt ball  press 12 x Wt ball OH ext 10 x Wt ball trunk rotation 10 x each    12/09/22 Nustep level 5 x 10 minutes UBE level 4 x 6 minutes Black tband trunk extension and flexion Seated row 20# 2x15 Lats 20# 2x15 Calf stretches Worked on getting up and down from the floor used the large mat quadruple folded, she could kneel and get up with bed and then kneel and get up without the bed Ball squeeze with bridges Isometric abs Passive HS and piriformis stretches  11/27/22 Nustep L 5 UBE L 4 2 min fwd/2 min back Black tband trunk flex and ext 2 sets 10 Seated Row and Lat pull 20# 2 sets 10 HS curl 20# 2 sets 10 STS from mat 10 x 3# LAQ 2 sets 10 3# marching 2 sets 10 Black bar DF and PF 2 sets 10 Amb 3 min for stamina  11/18/22 Bike level 1 x 5 minutes Nustep level 5 x 5 minutes Calf stretches Seated rows 20# 2x10 Lats 20# LEg curls 20# 2x10 2.5# marches, Hip abduction and extension Balance on airex balance beam side stpping and tandem walking On airex ball toss Passive HS and piriformis stretches  PATIENT EDUCATION:  Education details: POC Person educated: Patient Education method: Explanation Education comprehension: verbalized understanding  HOME EXERCISE PROGRAM: TBD  ASSESSMENT:  CLINICAL IMPRESSION: Patient continues to have increased LBP and knee pain, she has been active in the garden, she reports some frustration with the pain and the difficulty it causes with her keeping up with her exercises, we discussed this and the weight loss as well OBJECTIVE IMPAIRMENTS: Abnormal gait, cardiopulmonary status limiting activity, decreased activity tolerance, decreased balance, decreased coordination, decreased endurance, decreased mobility, difficulty walking, decreased ROM, decreased strength, increased fascial restrictions, impaired perceived functional ability, increased muscle spasms, impaired flexibility, improper body mechanics, and pain.   REHAB POTENTIAL:  Good  CLINICAL DECISION MAKING: Stable/uncomplicated  EVALUATION COMPLEXITY: Low   GOALS: Goals reviewed with patient? Yes  SHORT TERM GOALS: Target date: 11/24/22  Independent with initial HEP Goal status: 11/27/22 MET  LONG TERM GOALS: Target date: 02/11/23  Independent iwht posture and body mechanics Goal status:progressing 12/09/22 and 12/16/22  2.  Walk 500 feet in a 3 minute walk test Goal status:ongoing 12/09/22  3.  Decrease pain 25% Goal status: ongoing 12/30/22  4.  Be able to cook a meal without pain > 5/10 Goal status: progressing 12/30/22  5.  Independent with HEP or gym Goal status: progressing 12/16/22  PLAN:  PT FREQUENCY: 1-2x/week  PT DURATION: 12 weeks  PLANNED INTERVENTIONS: Therapeutic exercises, Therapeutic activity, Neuromuscular re-education, Balance training, Gait training, Patient/Family education, Self Care, Joint mobilization, Dry Needling, Electrical stimulation, Cryotherapy, Moist heat,  and Manual therapy.  PLAN FOR NEXT SESSION: continue to push her to be more active and as functional as possible   Jearld Lesch, PT 12/30/2022, 9:32 AM [place]

## 2023-01-01 ENCOUNTER — Encounter: Payer: Self-pay | Admitting: Physical Therapy

## 2023-01-01 ENCOUNTER — Ambulatory Visit: Payer: Medicare PPO | Admitting: Physical Therapy

## 2023-01-01 DIAGNOSIS — M62830 Muscle spasm of back: Secondary | ICD-10-CM

## 2023-01-01 DIAGNOSIS — R262 Difficulty in walking, not elsewhere classified: Secondary | ICD-10-CM

## 2023-01-01 DIAGNOSIS — M25561 Pain in right knee: Secondary | ICD-10-CM

## 2023-01-01 DIAGNOSIS — M5459 Other low back pain: Secondary | ICD-10-CM

## 2023-01-01 NOTE — Therapy (Signed)
OUTPATIENT PHYSICAL THERAPY THORACOLUMBAR TREATMENT   Patient Name: Jenna Mcdonald MRN: [id number] DOB:01/05/1946, 77 y.o., female Today's Date: 01/01/2023  END OF SESSION:  PT End of Session - 01/01/23 0928     Visit Number 8    Number of Visits 13    Date for PT Re-Evaluation 01/27/23    Authorization Type [employer]    PT Start Time 0926    PT Stop Time 1015    PT Time Calculation (min) 49 min    Activity Tolerance Patient tolerated treatment well    Behavior During Therapy [facility] for tasks assessed/performed             Past Medical History:  Diagnosis Date   Anemia    Aortic stenosis    mild by echo 09/2020 and 08/2022 with mean AVG   Breast CA (HCC)    left   Breast cancer (HCC)    Carotid stenosis    1-39% left   Degenerative disc disease, lumbar    of the knees   Diabetes mellitus    Dysrhythmia    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HCC)    Encounter for therapeutic drug monitoring 08/17/2018   Fatigue    Severe secondary to to gemfibrozil   Gastroesophageal reflux disease    denies   GI bleeding    a. 06/2018: EGD showed multiple nonbleeding duodenal ulcers. 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   Hammertoe    left second toe   Hypercholesteremia    Hypertension    Insomnia    Irritable bowel    Low back pain    Metabolic syndrome    Morbid obesity (HCC)    OSA on CPAP \   bipap   Osteoarthritis    Osteopenia    Persistent atrial fibrillation [facility])    s/p afib ablation - Dr. Johney Frame 08/2018   Personal history of radiation therapy    Pneumonia    PONV (postoperative nausea and vomiting)    Sigmoid diverticulosis    Thalassemia minor    Unstable angina (HCC) 10/23/2020   Vitamin D deficiency    Past Surgical History:  Procedure Laterality Date   ATRIAL FIBRILLATION ABLATION N/A 09/21/2018   Procedure: ATRIAL FIBRILLATION ABLATION;  Surgeon: Hillis Range, MD;  Location: [facility]  INVASIVE CV LAB;  Service: Cardiovascular;  Laterality: N/A;   BIOPSY  07/06/2018   Procedure: BIOPSY;  Surgeon: Vida Rigger, MD;  Location: [facility] ENDOSCOPY;  Service: Endoscopy;;   BREAST LUMPECTOMY Left 05/28/2009   BREAST SURGERY Left 2010   lumpectomy   CARDIOVERSION N/A 07/07/2018   Procedure: CARDIOVERSION;  Surgeon: Chilton Si, MD;  Location: [facility] ENDOSCOPY;  Service: Cardiovascular;  Laterality: N/A;   CARDIOVERSION N/A 07/14/2018   Procedure: CARDIOVERSION;  Surgeon: Laurey Morale, MD;  Location: [facility] ENDOSCOPY;  Service: Cardiovascular;  Laterality: N/A;   CESAREAN SECTION     COLONOSCOPY WITH PROPOFOL N/A 07/06/2018   Procedure: COLONOSCOPY WITH PROPOFOL;  Surgeon: Vida Rigger, MD;  Location: [facility] ENDOSCOPY;  Service: Endoscopy;  Laterality: N/A;   CYSTECTOMY  1970   pilonidal    DILATION AND CURETTAGE OF UTERUS     ESOPHAGOGASTRODUODENOSCOPY (EGD) WITH PROPOFOL N/A 07/06/2018   Procedure: ESOPHAGOGASTRODUODENOSCOPY (EGD) WITH PROPOFOL;  Surgeon: Vida Rigger, MD;  Location: [facility] ENDOSCOPY;  Service: Endoscopy;  Laterality: N/A;   EYE SURGERY Bilateral 07/2013   cataracts   HOT HEMOSTASIS N/A 07/06/2018   Procedure: HOT HEMOSTASIS (ARGON PLASMA COAGULATION/BICAP);  Surgeon: Ewing Schlein,  Vernia Buff, MD;  Location: [facility] ENDOSCOPY;  Service: Endoscopy;  Laterality: N/A;   implantable loop recorder placement  01/15/2022   Medtronic Reveal Linq model [id number] implantable loop recorder (SN [id number]) implanted for AF management by Dr Johney Frame   MAXIMUM ACCESS (MAS)POSTERIOR LUMBAR INTERBODY FUSION (PLIF) 2 LEVEL N/A 09/22/2013   Procedure: FOR MAXIMUM ACCESS  POSTERIOR LUMBAR INTERBODY FUSION LUMBAR THREE-FOUR FOUR-FIVE;  Surgeon: Tia Alert, MD;  Location: [facility] NEURO ORS;  Service: Neurosurgery;  Laterality: N/A;   OOPHORECTOMY     wedge section not rem   REPLACEMENT TOTAL KNEE Left 2011   REPLACEMENT TOTAL KNEE     RIGHT/LEFT HEART CATH AND CORONARY ANGIOGRAPHY N/A 10/26/2020   Procedure:  RIGHT/LEFT HEART CATH AND CORONARY ANGIOGRAPHY;  Surgeon: Marykay Lex, MD;  Location: [facility] INVASIVE CV LAB;  Service: Cardiovascular;  Laterality: N/A;   TEE WITHOUT CARDIOVERSION N/A 07/07/2018   Procedure: TRANSESOPHAGEAL ECHOCARDIOGRAM (TEE);  Surgeon: Chilton Si, MD;  Location: [facility] ENDOSCOPY;  Service: Cardiovascular;  Laterality: N/A;   TEE WITHOUT CARDIOVERSION N/A 09/21/2018   Procedure: TRANSESOPHAGEAL ECHOCARDIOGRAM (TEE);  Surgeon: Elease Hashimoto Deloris Ping, MD;  Location: [facility] ENDOSCOPY;  Service: Cardiovascular;  Laterality: N/A;   TONSILLECTOMY     TUBAL LIGATION     Patient Active Problem List   Diagnosis Date Noted   Shortness of breath    Unstable angina (HCC) 10/23/2020   Secondary hypercoagulable state (HCC) 10/15/2020   Encounter for therapeutic drug monitoring 08/17/2018   CKD (chronic kidney disease) stage 3, GFR 30-59 ml/min (HCC) 07/21/2018   Chronic anemia 07/21/2018   Anemia    Heme positive stool    Atrial fibrillation with RVR (HCC) 07/04/2018   Carotid stenosis    Bruit of left carotid artery 09/22/2017   Aortic stenosis    Heart murmur 09/09/2016   Morbid obesity (HCC) 02/05/2016   Dry eye 05/17/2015   Type 2 diabetes mellitus without complication (HCC) 08/10/2014   Breast cancer, left breast (HCC) 07/22/2014   Obesity, Class III, BMI 40-49.9 (morbid obesity) (HCC) 07/06/2014   Macular pseudohole 05/17/2014   History of surgical procedure 05/17/2014   OSA (obstructive sleep apnea) 11/17/2013   S/P lumbar spinal fusion 09/22/2013   Persistent atrial fibrillation (HCC) 09/11/2011    Class: Acute   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HCC) 09/11/2011    Class: Chronic   Essential hypertension 09/11/2011   Exogenous obesity 09/11/2011   Gastroesophageal reflux disease 09/11/2011   Thalassemia minor 09/11/2011    Class: Chronic   Breast cancer (HCC) 09/11/2011    Class: Chronic   Degenerative joint disease 09/11/2011    Class: Chronic   Irritable bowel disease  09/11/2011    Class: Chronic   Lumbar disc disease 09/11/2011    PCP: Margaretann Loveless, MD  REFERRING PROVIDER: Baird Lyons, DO  REFERRING DIAG: chronic bag and knee pain  Rationale for Evaluation and Treatment: Rehabilitation  THERAPY DIAG:  Other low back pain  Difficulty in walking, not elsewhere classified  Acute pain of right knee  Muscle spasm of back  ONSET DATE: 10/14/22  SUBJECTIVE:  SUBJECTIVE STATEMENT: Still with some knee pain and c/o being very tired, less knee pain from previous   PERTINENT HISTORY:  See above  PAIN:  Are you having pain? Back and knee 7/10   PRECAUTIONS: Other: lumbar fusion 2015  WEIGHT BEARING RESTRICTIONS: No  FALLS:  Has patient fallen in last 6 months? No  LIVING ENVIRONMENT: Lives with: lives with their family Lives in: House/apartment Stairs: Yes: Internal: 15 steps; can reach both Has following equipment at home: Single point cane and uses the cane for long distances grocery shopping  OCCUPATION: retired  PLOF: Independent and gardens, does Presenter, broadcasting, cooks and cleans  PATIENT GOALS: strengthen core and have less pain  NEXT MD VISIT: 11/18/22  OBJECTIVE:   DIAGNOSTIC FINDINGS:  No recent x-rays   PATIENT SURVEYS:  ODI = 70% disability  COGNITION: Overall cognitive status: Within functional limits for tasks assessed     SENSATION: WFL  MUSCLE LENGTH: Very tight HS, piriformis and quads  POSTURE: rounded shoulders and forward head  PALPATION: Tender and tight in the low back mms, tightness iin the rhomboids and the upper traps  LUMBAR ROM:   AROM eval 12/09/22  Flexion Decreased 50% 25%  Extension Decreased 100% Decreased 100%  Right lateral flexion Decreased 75% Decreased 75%  Left lateral flexion Decreased 75% Decreased 75%   Right rotation    Left rotation     (Blank rows = not tested)  LOWER EXTREMITY ROM:     Active  Right eval Left eval  Hip flexion    Hip extension    Hip abduction    Hip adduction    Hip internal rotation    Hip external rotation    Knee flexion 10 5  Knee extension 95 100  Ankle dorsiflexion    Ankle plantarflexion    Ankle inversion    Ankle eversion     (Blank rows = not tested)  LOWER EXTREMITY MMT:    MMT Right eval Left eval  Hip flexion 4- 4-  Hip extension 4- 4-  Hip abduction 4- 4-  Hip adduction    Hip internal rotation    Hip external rotation    Knee flexion 3+ 4-  Knee extension 3+ 4-  Ankle dorsiflexion 4 4  Ankle plantarflexion    Ankle inversion    Ankle eversion     (Blank rows = not tested)  FUNCTIONAL TESTS:  3 minute walk test: 220 feet had to stop at 30 seconds ODI:  GAIT: Distance walked: 220 feet Assistive device utilized: Single point cane Level of assistance: Complete Independence Comments: stopped due to back pain  TODAY'S TREATMENT:                                                                                                                              DATE:   01/01/23 Nustep level 5 x 8 minutes 5# straight arm pulls on the airex cues for core and posture  Leg press 20# 2x10 Leg curls 20# 2x10 Tried extension but this caused some knee pain 3# LAQ 3# hip extension 3# hip abduction Gait outside around the parking [place] some pain coming up slope 25# rows 25# lats Black tband lumbar extension Bike x 3 minutes difficulty with knee ROM  12/30/22 Nustep level 4 x 9 minutes 5# straight arm pulls with cues for core and posture 20# 2x10 triceps 5# biceps 2x10 Feet on ball K2C, rotation, bridge, iso abs Passive stretch of the HS and piriformis Pateint was frustrated about the lack of weight loss, we talked about her choices and the weight that she has lost is sustainable and what she can do to keep up the momentum  that she has  12/23/22 Amb outside 6 min, slower than normal d/t pain Sitting on dynadisc pelvic ROM 15 x 4 way Red tband scap stab on dynadisc 4 ways 15 x each Feet on ball bridge, KTC, obl, iso abdominals PROM LE and trunk- gentle  12/16/22 Nustep L 5 UBE L4 6 min Black tband trunk extension and flexion 20 x Seated row 20# 2x15 Lats 20# 2x15 Farmers carry 7# 2 laps each hand STS with wt ball press 12 x Wt ball OH ext 10 x Wt ball trunk rotation 10 x each    12/09/22 Nustep level 5 x 10 minutes UBE level 4 x 6 minutes Black tband trunk extension and flexion Seated row 20# 2x15 Lats 20# 2x15 Calf stretches Worked on getting up and down from the floor used the large mat quadruple folded, she could kneel and get up with bed and then kneel and get up without the bed Ball squeeze with bridges Isometric abs Passive HS and piriformis stretches  11/27/22 Nustep L 5 UBE L 4 2 min fwd/2 min back Black tband trunk flex and ext 2 sets 10 Seated Row and Lat pull 20# 2 sets 10 HS curl 20# 2 sets 10 STS from mat 10 x 3# LAQ 2 sets 10 3# marching 2 sets 10 Black bar DF and PF 2 sets 10 Amb 3 min for stamina  11/18/22 Bike level 1 x 5 minutes Nustep level 5 x 5 minutes Calf stretches Seated rows 20# 2x10 Lats 20# LEg curls 20# 2x10 2.5# marches, Hip abduction and extension Balance on airex balance beam side stpping and tandem walking On airex ball toss Passive HS and piriformis stretches  PATIENT EDUCATION:  Education details: POC Person educated: Patient Education method: Explanation Education comprehension: verbalized understanding  HOME EXERCISE PROGRAM: TBD  ASSESSMENT:  CLINICAL IMPRESSION: Patient doing better today, less pain overall, she is reporting that she is remaining active, we did walk outside today and did well but had some pain in the knee coming up the hill.  I feel like she is doing better today and I encouraged her to walk again but limit  slopes and not cause pain.  She did really struggle with knee ROM on the bike OBJECTIVE IMPAIRMENTS: Abnormal gait, cardiopulmonary status limiting activity, decreased activity tolerance, decreased balance, decreased coordination, decreased endurance, decreased mobility, difficulty walking, decreased ROM, decreased strength, increased fascial restrictions, impaired perceived functional ability, increased muscle spasms, impaired flexibility, improper body mechanics, and pain.   REHAB POTENTIAL: Good  CLINICAL DECISION MAKING: Stable/uncomplicated  EVALUATION COMPLEXITY: Low   GOALS: Goals reviewed with patient? Yes  SHORT TERM GOALS: Target date: 11/24/22  Independent with initial HEP Goal status: 11/27/22 MET  LONG TERM GOALS: Target date: 02/11/23  Independent iwht  posture and body mechanics Goal status:met 01/01/23  2.  Walk 500 feet in a 3 minute walk test Goal status:ongoing 12/09/22  3.  Decrease pain 25% Goal status: ongoing 12/30/22  4.  Be able to cook a meal without pain > 5/10 Goal status: progressing 12/30/22  5.  Independent with HEP or gym Goal status: progressing 01/01/23  PLAN:  PT FREQUENCY: 1-2x/week  PT DURATION: 12 weeks  PLANNED INTERVENTIONS: Therapeutic exercises, Therapeutic activity, Neuromuscular re-education, Balance training, Gait training, Patient/Family education, Self Care, Joint mobilization, Dry Needling, Electrical stimulation, Cryotherapy, Moist heat, and Manual therapy.  PLAN FOR NEXT SESSION: continue to push her to be more active and as functional as possible may look at requesting more visits if needed in the future   Jearld Lesch, PT 01/01/2023, 9:30 AM [place]

## 2023-01-06 DIAGNOSIS — H9042 Sensorineural hearing loss, unilateral, left ear, with unrestricted hearing on the contralateral side: Secondary | ICD-10-CM | POA: Diagnosis not present

## 2023-01-06 DIAGNOSIS — H6121 Impacted cerumen, right ear: Secondary | ICD-10-CM | POA: Diagnosis not present

## 2023-01-07 ENCOUNTER — Ambulatory Visit: Payer: Medicare PPO | Admitting: Physical Therapy

## 2023-01-07 ENCOUNTER — Encounter: Payer: Self-pay | Admitting: Physical Therapy

## 2023-01-07 DIAGNOSIS — M25561 Pain in right knee: Secondary | ICD-10-CM

## 2023-01-07 DIAGNOSIS — M5459 Other low back pain: Secondary | ICD-10-CM

## 2023-01-07 DIAGNOSIS — M62830 Muscle spasm of back: Secondary | ICD-10-CM

## 2023-01-07 DIAGNOSIS — R262 Difficulty in walking, not elsewhere classified: Secondary | ICD-10-CM

## 2023-01-07 DIAGNOSIS — F418 Other specified anxiety disorders: Secondary | ICD-10-CM | POA: Diagnosis not present

## 2023-01-07 DIAGNOSIS — E1169 Type 2 diabetes mellitus with other specified complication: Secondary | ICD-10-CM | POA: Diagnosis not present

## 2023-01-07 DIAGNOSIS — Z6836 Body mass index (BMI) 36.0-36.9, adult: Secondary | ICD-10-CM | POA: Diagnosis not present

## 2023-01-07 NOTE — Therapy (Signed)
OUTPATIENT PHYSICAL THERAPY THORACOLUMBAR TREATMENT   Patient Name: Jenna Mcdonald MRN: [id number] DOB:1945/09/12, 77 y.o., female Today's Date: 01/07/2023  END OF SESSION:  PT End of Session - 01/07/23 1020     Visit Number 9    Number of Visits 13    Date for PT Re-Evaluation 01/27/23    Authorization Type [employer]    PT Start Time 1015    PT Stop Time 1100    PT Time Calculation (min) 45 min    Activity Tolerance Patient tolerated treatment well    Behavior During Therapy [facility] for tasks assessed/performed             Past Medical History:  Diagnosis Date   Anemia    Aortic stenosis    mild by echo 09/2020 and 08/2022 with mean AVG   Breast CA (HCC)    left   Breast cancer (HCC)    Carotid stenosis    1-39% left   Degenerative disc disease, lumbar    of the knees   Diabetes mellitus    Dysrhythmia    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HCC)    Encounter for therapeutic drug monitoring 08/17/2018   Fatigue    Severe secondary to to gemfibrozil   Gastroesophageal reflux disease    denies   GI bleeding    a. 06/2018: EGD showed multiple nonbleeding duodenal ulcers. 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   Hammertoe    left second toe   Hypercholesteremia    Hypertension    Insomnia    Irritable bowel    Low back pain    Metabolic syndrome    Morbid obesity (HCC)    OSA on CPAP \   bipap   Osteoarthritis    Osteopenia    Persistent atrial fibrillation [facility])    s/p afib ablation - Dr. Johney Frame 08/2018   Personal history of radiation therapy    Pneumonia    PONV (postoperative nausea and vomiting)    Sigmoid diverticulosis    Thalassemia minor    Unstable angina (HCC) 10/23/2020   Vitamin D deficiency    Past Surgical History:  Procedure Laterality Date   ATRIAL FIBRILLATION ABLATION N/A 09/21/2018   Procedure: ATRIAL FIBRILLATION ABLATION;  Surgeon: Hillis Range, MD;  Location: [facility]  INVASIVE CV LAB;  Service: Cardiovascular;  Laterality: N/A;   BIOPSY  07/06/2018   Procedure: BIOPSY;  Surgeon: Vida Rigger, MD;  Location: [facility] ENDOSCOPY;  Service: Endoscopy;;   BREAST LUMPECTOMY Left 05/28/2009   BREAST SURGERY Left 2010   lumpectomy   CARDIOVERSION N/A 07/07/2018   Procedure: CARDIOVERSION;  Surgeon: Chilton Si, MD;  Location: [facility] ENDOSCOPY;  Service: Cardiovascular;  Laterality: N/A;   CARDIOVERSION N/A 07/14/2018   Procedure: CARDIOVERSION;  Surgeon: Laurey Morale, MD;  Location: [facility] ENDOSCOPY;  Service: Cardiovascular;  Laterality: N/A;   CESAREAN SECTION     COLONOSCOPY WITH PROPOFOL N/A 07/06/2018   Procedure: COLONOSCOPY WITH PROPOFOL;  Surgeon: Vida Rigger, MD;  Location: [facility] ENDOSCOPY;  Service: Endoscopy;  Laterality: N/A;   CYSTECTOMY  1970   pilonidal    DILATION AND CURETTAGE OF UTERUS     ESOPHAGOGASTRODUODENOSCOPY (EGD) WITH PROPOFOL N/A 07/06/2018   Procedure: ESOPHAGOGASTRODUODENOSCOPY (EGD) WITH PROPOFOL;  Surgeon: Vida Rigger, MD;  Location: [facility] ENDOSCOPY;  Service: Endoscopy;  Laterality: N/A;   EYE SURGERY Bilateral 07/2013   cataracts   HOT HEMOSTASIS N/A 07/06/2018   Procedure: HOT HEMOSTASIS (ARGON PLASMA COAGULATION/BICAP);  Surgeon: Ewing Schlein,  Vernia Buff, MD;  Location: [facility] ENDOSCOPY;  Service: Endoscopy;  Laterality: N/A;   implantable loop recorder placement  01/15/2022   Medtronic Reveal Linq model [id number] implantable loop recorder (SN [id number]) implanted for AF management by Dr Johney Frame   MAXIMUM ACCESS (MAS)POSTERIOR LUMBAR INTERBODY FUSION (PLIF) 2 LEVEL N/A 09/22/2013   Procedure: FOR MAXIMUM ACCESS  POSTERIOR LUMBAR INTERBODY FUSION LUMBAR THREE-FOUR FOUR-FIVE;  Surgeon: Tia Alert, MD;  Location: [facility] NEURO ORS;  Service: Neurosurgery;  Laterality: N/A;   OOPHORECTOMY     wedge section not rem   REPLACEMENT TOTAL KNEE Left 2011   REPLACEMENT TOTAL KNEE     RIGHT/LEFT HEART CATH AND CORONARY ANGIOGRAPHY N/A 10/26/2020   Procedure:  RIGHT/LEFT HEART CATH AND CORONARY ANGIOGRAPHY;  Surgeon: Marykay Lex, MD;  Location: [facility] INVASIVE CV LAB;  Service: Cardiovascular;  Laterality: N/A;   TEE WITHOUT CARDIOVERSION N/A 07/07/2018   Procedure: TRANSESOPHAGEAL ECHOCARDIOGRAM (TEE);  Surgeon: Chilton Si, MD;  Location: [facility] ENDOSCOPY;  Service: Cardiovascular;  Laterality: N/A;   TEE WITHOUT CARDIOVERSION N/A 09/21/2018   Procedure: TRANSESOPHAGEAL ECHOCARDIOGRAM (TEE);  Surgeon: Elease Hashimoto Deloris Ping, MD;  Location: [facility] ENDOSCOPY;  Service: Cardiovascular;  Laterality: N/A;   TONSILLECTOMY     TUBAL LIGATION     Patient Active Problem List   Diagnosis Date Noted   Shortness of breath    Unstable angina (HCC) 10/23/2020   Secondary hypercoagulable state (HCC) 10/15/2020   Encounter for therapeutic drug monitoring 08/17/2018   CKD (chronic kidney disease) stage 3, GFR 30-59 ml/min (HCC) 07/21/2018   Chronic anemia 07/21/2018   Anemia    Heme positive stool    Atrial fibrillation with RVR (HCC) 07/04/2018   Carotid stenosis    Bruit of left carotid artery 09/22/2017   Aortic stenosis    Heart murmur 09/09/2016   Morbid obesity (HCC) 02/05/2016   Dry eye 05/17/2015   Type 2 diabetes mellitus without complication (HCC) 08/10/2014   Breast cancer, left breast (HCC) 07/22/2014   Obesity, Class III, BMI 40-49.9 (morbid obesity) (HCC) 07/06/2014   Macular pseudohole 05/17/2014   History of surgical procedure 05/17/2014   OSA (obstructive sleep apnea) 11/17/2013   S/P lumbar spinal fusion 09/22/2013   Persistent atrial fibrillation (HCC) 09/11/2011    Class: Acute   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HCC) 09/11/2011    Class: Chronic   Essential hypertension 09/11/2011   Exogenous obesity 09/11/2011   Gastroesophageal reflux disease 09/11/2011   Thalassemia minor 09/11/2011    Class: Chronic   Breast cancer (HCC) 09/11/2011    Class: Chronic   Degenerative joint disease 09/11/2011    Class: Chronic   Irritable bowel disease  09/11/2011    Class: Chronic   Lumbar disc disease 09/11/2011    PCP: Margaretann Loveless, MD  REFERRING PROVIDER: Baird Lyons, DO  REFERRING DIAG: chronic bag and knee pain  Rationale for Evaluation and Treatment: Rehabilitation  THERAPY DIAG:  Other low back pain  Difficulty in walking, not elsewhere classified  Acute pain of right knee  Muscle spasm of back  ONSET DATE: 10/14/22  SUBJECTIVE:  SUBJECTIVE STATEMENT: Knee pain continues, reports back is "okay"   PERTINENT HISTORY:  See above  PAIN:  Are you having pain? Back and knee 7/10   PRECAUTIONS: Other: lumbar fusion 2015  WEIGHT BEARING RESTRICTIONS: No  FALLS:  Has patient fallen in last 6 months? No  LIVING ENVIRONMENT: Lives with: lives with their family Lives in: House/apartment Stairs: Yes: Internal: 15 steps; can reach both Has following equipment at home: Single point cane and uses the cane for long distances grocery shopping  OCCUPATION: retired  PLOF: Independent and gardens, does Presenter, broadcasting, cooks and cleans  PATIENT GOALS: strengthen core and have less pain  NEXT MD VISIT: 11/18/22  OBJECTIVE:   DIAGNOSTIC FINDINGS:  No recent x-rays   PATIENT SURVEYS:  ODI = 70% disability  COGNITION: Overall cognitive status: Within functional limits for tasks assessed     SENSATION: WFL  MUSCLE LENGTH: Very tight HS, piriformis and quads  POSTURE: rounded shoulders and forward head  PALPATION: Tender and tight in the low back mms, tightness iin the rhomboids and the upper traps  LUMBAR ROM:   AROM eval 12/09/22  Flexion Decreased 50% 25%  Extension Decreased 100% Decreased 100%  Right lateral flexion Decreased 75% Decreased 75%  Left lateral flexion Decreased 75% Decreased 75%  Right rotation    Left rotation      (Blank rows = not tested)  LOWER EXTREMITY ROM:     Active  Right eval Left eval  Hip flexion    Hip extension    Hip abduction    Hip adduction    Hip internal rotation    Hip external rotation    Knee flexion 10 5  Knee extension 95 100  Ankle dorsiflexion    Ankle plantarflexion    Ankle inversion    Ankle eversion     (Blank rows = not tested)  LOWER EXTREMITY MMT:    MMT Right eval Left eval  Hip flexion 4- 4-  Hip extension 4- 4-  Hip abduction 4- 4-  Hip adduction    Hip internal rotation    Hip external rotation    Knee flexion 3+ 4-  Knee extension 3+ 4-  Ankle dorsiflexion 4 4  Ankle plantarflexion    Ankle inversion    Ankle eversion     (Blank rows = not tested)  FUNCTIONAL TESTS:  3 minute walk test: 220 feet had to stop at 30 seconds ODI:  GAIT: Distance walked: 220 feet Assistive device utilized: Single point cane Level of assistance: Complete Independence Comments: stopped due to back pain  TODAY'S TREATMENT:                                                                                                                              DATE:   01/07/23 Bike Level 4 x 3 minutes had to stop due to knee pain Leg curls 25# 3x10 Rows 25# 3x10 Lats 20# 3x10 Black tband lumbar  extension Black tband trunk flexion 3# standing march and hip abduction 3# LAQ Nustep level 5 x 8 mintues Feet on ball K2C, rotation, bridge and isometric abs  01/01/23 Nustep level 5 x 8 minutes 5# straight arm pulls on the airex cues for core and posture Leg press 20# 2x10 Leg curls 20# 2x10 Tried extension but this caused some knee pain 3# LAQ 3# hip extension 3# hip abduction Gait outside around the parking [place] some pain coming up slope 25# rows 25# lats Black tband lumbar extension Bike x 3 minutes difficulty with knee ROM  12/30/22 Nustep level 4 x 9 minutes 5# straight arm pulls with cues for core and posture 20# 2x10 triceps 5# biceps  2x10 Feet on ball K2C, rotation, bridge, iso abs Passive stretch of the HS and piriformis Pateint Mcdonald frustrated about the lack of weight loss, we talked about her choices and the weight that she has lost is sustainable and what she can do to keep up the momentum that she has  12/23/22 Amb outside 6 min, slower than normal d/t pain Sitting on dynadisc pelvic ROM 15 x 4 way Red tband scap stab on dynadisc 4 ways 15 x each Feet on ball bridge, KTC, obl, iso abdominals PROM LE and trunk- gentle  12/16/22 Nustep L 5 UBE L4 6 min Black tband trunk extension and flexion 20 x Seated row 20# 2x15 Lats 20# 2x15 Farmers carry 7# 2 laps each hand STS with wt ball press 12 x Wt ball OH ext 10 x Wt ball trunk rotation 10 x each    12/09/22 Nustep level 5 x 10 minutes UBE level 4 x 6 minutes Black tband trunk extension and flexion Seated row 20# 2x15 Lats 20# 2x15 Calf stretches Worked on getting up and down from the floor used the large mat quadruple folded, she could kneel and get up with bed and then kneel and get up without the bed Ball squeeze with bridges Isometric abs Passive HS and piriformis stretches  11/27/22 Nustep L 5 UBE L 4 2 min fwd/2 min back Black tband trunk flex and ext 2 sets 10 Seated Row and Lat pull 20# 2 sets 10 HS curl 20# 2 sets 10 STS from mat 10 x 3# LAQ 2 sets 10 3# marching 2 sets 10 Black bar DF and PF 2 sets 10 Amb 3 min for stamina  11/18/22 Bike level 1 x 5 minutes Nustep level 5 x 5 minutes Calf stretches Seated rows 20# 2x10 Lats 20# LEg curls 20# 2x10 2.5# marches, Hip abduction and extension Balance on airex balance beam side stpping and tandem walking On airex ball toss Passive HS and piriformis stretches  PATIENT EDUCATION:  Education details: POC Person educated: Patient Education method: Explanation Education comprehension: verbalized understanding  HOME EXERCISE PROGRAM: TBD  ASSESSMENT:  CLINICAL IMPRESSION:  Patient frustrated with the knee pain, the back is still hurting but overall is better, she really struggled on the bike today with her knee, we stopped at 3 minutes.  She reports that because the weather Mcdonald so nice this weekend she Mcdonald pretty active and did okay, reports that the past few days with all the rain she has not done much OBJECTIVE IMPAIRMENTS: Abnormal gait, cardiopulmonary status limiting activity, decreased activity tolerance, decreased balance, decreased coordination, decreased endurance, decreased mobility, difficulty walking, decreased ROM, decreased strength, increased fascial restrictions, impaired perceived functional ability, increased muscle spasms, impaired flexibility, improper body mechanics, and pain.   REHAB POTENTIAL:  Good  CLINICAL DECISION MAKING: Stable/uncomplicated  EVALUATION COMPLEXITY: Low   GOALS: Goals reviewed with patient? Yes  SHORT TERM GOALS: Target date: 11/24/22  Independent with initial HEP Goal status: 11/27/22 MET  LONG TERM GOALS: Target date: 02/11/23  Independent iwht posture and body mechanics Goal status:met 01/01/23  2.  Walk 500 feet in a 3 minute walk test Goal status:ongoing 12/09/22  3.  Decrease pain 25% Goal status: ongoing 12/30/22  4.  Be able to cook a meal without pain > 5/10 Goal status: progressing 01/07/23  5.  Independent with HEP or gym Goal status: progressing 01/07/23  PLAN:  PT FREQUENCY: 1-2x/week  PT DURATION: 12 weeks  PLANNED INTERVENTIONS: Therapeutic exercises, Therapeutic activity, Neuromuscular re-education, Balance training, Gait training, Patient/Family education, Self Care, Joint mobilization, Dry Needling, Electrical stimulation, Cryotherapy, Moist heat, and Manual therapy.  PLAN FOR NEXT SESSION: continue to push her to be more active and as functional as possible may look at requesting more visits if needed in the future   Jearld Lesch, PT 01/07/2023, 10:22 AM [place]

## 2023-01-08 DIAGNOSIS — H903 Sensorineural hearing loss, bilateral: Secondary | ICD-10-CM | POA: Diagnosis not present

## 2023-01-09 ENCOUNTER — Other Ambulatory Visit (HOSPITAL_BASED_OUTPATIENT_CLINIC_OR_DEPARTMENT_OTHER): Payer: Self-pay

## 2023-01-09 MED ORDER — MOUNJARO 5 MG/0.5ML ~~LOC~~ SOAJ
5.0000 mg | SUBCUTANEOUS | 3 refills | Status: DC
  Filled 2023-01-09: qty 2, 28d supply, fill #0
  Filled 2023-01-30: qty 2, 28d supply, fill #1
  Filled 2023-02-27: qty 2, 28d supply, fill #2
  Filled 2023-03-27 (×2): qty 2, 28d supply, fill #3

## 2023-01-13 ENCOUNTER — Ambulatory Visit: Payer: Medicare PPO | Admitting: Physical Therapy

## 2023-01-13 ENCOUNTER — Encounter: Payer: Self-pay | Admitting: Physical Therapy

## 2023-01-13 DIAGNOSIS — M62830 Muscle spasm of back: Secondary | ICD-10-CM

## 2023-01-13 DIAGNOSIS — M25561 Pain in right knee: Secondary | ICD-10-CM

## 2023-01-13 DIAGNOSIS — M5459 Other low back pain: Secondary | ICD-10-CM | POA: Diagnosis not present

## 2023-01-13 DIAGNOSIS — R262 Difficulty in walking, not elsewhere classified: Secondary | ICD-10-CM | POA: Diagnosis not present

## 2023-01-13 NOTE — Therapy (Signed)
OUTPATIENT PHYSICAL THERAPY THORACOLUMBAR TREATMENT Progress Note Reporting Period 11/11/22 to 01/13/23  See note below for Objective Data and Assessment of Progress/Goals.      Patient Name: Jenna Mcdonald MRN: [id number] DOB:12-Nov-1945, 77 y.o., female Today's Date: 01/13/2023  END OF SESSION:  PT End of Session - 01/13/23 0927     Visit Number 10    Number of Visits 13    Date for PT Re-Evaluation 01/27/23    Authorization Type [employer]    PT Start Time 0928    PT Stop Time 1012    PT Time Calculation (min) 44 min    Activity Tolerance Patient tolerated treatment well    Behavior During Therapy [facility] for tasks assessed/performed             Past Medical History:  Diagnosis Date   Anemia    Aortic stenosis    mild by echo 09/2020 and 08/2022 with mean AVG   Breast CA (HCC)    left   Breast cancer (HCC)    Carotid stenosis    1-39% left   Degenerative disc disease, lumbar    of the knees   Diabetes mellitus    Dysrhythmia    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HCC)    Encounter for therapeutic drug monitoring 08/17/2018   Fatigue    Severe secondary to to gemfibrozil   Gastroesophageal reflux disease    denies   GI bleeding    a. 06/2018: EGD showed multiple nonbleeding duodenal ulcers. 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   Hammertoe    left second toe   Hypercholesteremia    Hypertension    Insomnia    Irritable bowel    Low back pain    Metabolic syndrome    Morbid obesity (HCC)    OSA on CPAP \   bipap   Osteoarthritis    Osteopenia    Persistent atrial fibrillation [facility])    s/p afib ablation - Dr. Johney Frame 08/2018   Personal history of radiation therapy    Pneumonia    PONV (postoperative nausea and vomiting)    Sigmoid diverticulosis    Thalassemia minor    Unstable angina (HCC) 10/23/2020   Vitamin D deficiency    Past Surgical History:  Procedure Laterality Date   ATRIAL  FIBRILLATION ABLATION N/A 09/21/2018   Procedure: ATRIAL FIBRILLATION ABLATION;  Surgeon: Hillis Range, MD;  Location: [facility] INVASIVE CV LAB;  Service: Cardiovascular;  Laterality: N/A;   BIOPSY  07/06/2018   Procedure: BIOPSY;  Surgeon: Vida Rigger, MD;  Location: [facility] ENDOSCOPY;  Service: Endoscopy;;   BREAST LUMPECTOMY Left 05/28/2009   BREAST SURGERY Left 2010   lumpectomy   CARDIOVERSION N/A 07/07/2018   Procedure: CARDIOVERSION;  Surgeon: Chilton Si, MD;  Location: [facility] ENDOSCOPY;  Service: Cardiovascular;  Laterality: N/A;   CARDIOVERSION N/A 07/14/2018   Procedure: CARDIOVERSION;  Surgeon: Laurey Morale, MD;  Location: [facility] ENDOSCOPY;  Service: Cardiovascular;  Laterality: N/A;   CESAREAN SECTION     COLONOSCOPY WITH PROPOFOL N/A 07/06/2018   Procedure: COLONOSCOPY WITH PROPOFOL;  Surgeon: Vida Rigger, MD;  Location: [facility] ENDOSCOPY;  Service: Endoscopy;  Laterality: N/A;   CYSTECTOMY  1970   pilonidal    DILATION AND CURETTAGE OF UTERUS     ESOPHAGOGASTRODUODENOSCOPY (EGD) WITH PROPOFOL N/A 07/06/2018   Procedure: ESOPHAGOGASTRODUODENOSCOPY (EGD) WITH PROPOFOL;  Surgeon: Vida Rigger, MD;  Location[facility] ENDOSCOPY;  Service: Endoscopy;  Laterality: N/A;   EYE SURGERY Bilateral  07/2013   cataracts   HOT HEMOSTASIS N/A 07/06/2018   Procedure: HOT HEMOSTASIS (ARGON PLASMA COAGULATION/BICAP);  Surgeon: Vida Rigger, MD;  Location: [facility] ENDOSCOPY;  Service: Endoscopy;  Laterality: N/A;   implantable loop recorder placement  01/15/2022   Medtronic Reveal Linq model [id number] implantable loop recorder (SN [id number]) implanted for AF management by Dr Johney Frame   MAXIMUM ACCESS (MAS)POSTERIOR LUMBAR INTERBODY FUSION (PLIF) 2 LEVEL N/A 09/22/2013   Procedure: FOR MAXIMUM ACCESS  POSTERIOR LUMBAR INTERBODY FUSION LUMBAR THREE-FOUR FOUR-FIVE;  Surgeon: Tia Alert, MD;  Location: [facility] NEURO ORS;  Service: Neurosurgery;  Laterality: N/A;   OOPHORECTOMY     wedge section not rem   REPLACEMENT TOTAL KNEE  Left 2011   REPLACEMENT TOTAL KNEE     RIGHT/LEFT HEART CATH AND CORONARY ANGIOGRAPHY N/A 10/26/2020   Procedure: RIGHT/LEFT HEART CATH AND CORONARY ANGIOGRAPHY;  Surgeon: Marykay Lex, MD;  Location: [facility] INVASIVE CV LAB;  Service: Cardiovascular;  Laterality: N/A;   TEE WITHOUT CARDIOVERSION N/A 07/07/2018   Procedure: TRANSESOPHAGEAL ECHOCARDIOGRAM (TEE);  Surgeon: Chilton Si, MD;  Location: [facility] ENDOSCOPY;  Service: Cardiovascular;  Laterality: N/A;   TEE WITHOUT CARDIOVERSION N/A 09/21/2018   Procedure: TRANSESOPHAGEAL ECHOCARDIOGRAM (TEE);  Surgeon: Elease Hashimoto Deloris Ping, MD;  Location: [facility] ENDOSCOPY;  Service: Cardiovascular;  Laterality: N/A;   TONSILLECTOMY     TUBAL LIGATION     Patient Active Problem List   Diagnosis Date Noted   Shortness of breath    Unstable angina (HCC) 10/23/2020   Secondary hypercoagulable state (HCC) 10/15/2020   Encounter for therapeutic drug monitoring 08/17/2018   CKD (chronic kidney disease) stage 3, GFR 30-59 ml/min (HCC) 07/21/2018   Chronic anemia 07/21/2018   Anemia    Heme positive stool    Atrial fibrillation with RVR (HCC) 07/04/2018   Carotid stenosis    Bruit of left carotid artery 09/22/2017   Aortic stenosis    Heart murmur 09/09/2016   Morbid obesity (HCC) 02/05/2016   Dry eye 05/17/2015   Type 2 diabetes mellitus without complication (HCC) 08/10/2014   Breast cancer, left breast (HCC) 07/22/2014   Obesity, Class III, BMI 40-49.9 (morbid obesity) (HCC) 07/06/2014   Macular pseudohole 05/17/2014   History of surgical procedure 05/17/2014   OSA (obstructive sleep apnea) 11/17/2013   S/P lumbar spinal fusion 09/22/2013   Persistent atrial fibrillation (HCC) 09/11/2011    Class: Acute   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HCC) 09/11/2011    Class: Chronic   Essential hypertension 09/11/2011   Exogenous obesity 09/11/2011   Gastroesophageal reflux disease 09/11/2011   Thalassemia minor 09/11/2011    Class: Chronic   Breast cancer (HCC)  09/11/2011    Class: Chronic   Degenerative joint disease 09/11/2011    Class: Chronic   Irritable bowel disease 09/11/2011    Class: Chronic   Lumbar disc disease 09/11/2011    PCP: Margaretann Loveless, MD  REFERRING PROVIDER: Baird Lyons, DO  REFERRING DIAG: chronic bag and knee pain  Rationale for Evaluation and Treatment: Rehabilitation  THERAPY DIAG:  Other low back pain  Difficulty in walking, not elsewhere classified  Acute pain of right knee  Muscle spasm of back  ONSET DATE: 10/14/22  SUBJECTIVE:  SUBJECTIVE STATEMENT: I am having some cramps in my legs, thighs and inner thighs, really hurts  PERTINENT HISTORY:  See above  PAIN:  Are you having pain? Back and knee 7/10   PRECAUTIONS: Other: lumbar fusion 2015  WEIGHT BEARING RESTRICTIONS: No  FALLS:  Has patient fallen in last 6 months? No  LIVING ENVIRONMENT: Lives with: lives with their family Lives in: House/apartment Stairs: Yes: Internal: 15 steps; can reach both Has following equipment at home: Single point cane and uses the cane for long distances grocery shopping  OCCUPATION: retired  PLOF: Independent and gardens, does Presenter, broadcasting, cooks and cleans  PATIENT GOALS: strengthen core and have less pain  NEXT MD VISIT: 11/18/22  OBJECTIVE:   DIAGNOSTIC FINDINGS:  No recent x-rays   PATIENT SURVEYS:  ODI = 70% disability  COGNITION: Overall cognitive status: Within functional limits for tasks assessed     SENSATION: WFL  MUSCLE LENGTH: Very tight HS, piriformis and quads  POSTURE: rounded shoulders and forward head  PALPATION: Tender and tight in the low back mms, tightness iin the rhomboids and the upper traps  LUMBAR ROM:   AROM eval 12/09/22  Flexion Decreased 50% 25%  Extension Decreased 100% Decreased  100%  Right lateral flexion Decreased 75% Decreased 75%  Left lateral flexion Decreased 75% Decreased 75%  Right rotation    Left rotation     (Blank rows = not tested)  LOWER EXTREMITY ROM:     Active  Right eval Left eval  Hip flexion    Hip extension    Hip abduction    Hip adduction    Hip internal rotation    Hip external rotation    Knee flexion 10 5  Knee extension 95 100  Ankle dorsiflexion    Ankle plantarflexion    Ankle inversion    Ankle eversion     (Blank rows = not tested)  LOWER EXTREMITY MMT:    MMT Right eval Left eval  Hip flexion 4- 4-  Hip extension 4- 4-  Hip abduction 4- 4-  Hip adduction    Hip internal rotation    Hip external rotation    Knee flexion 3+ 4-  Knee extension 3+ 4-  Ankle dorsiflexion 4 4  Ankle plantarflexion    Ankle inversion    Ankle eversion     (Blank rows = not tested)  FUNCTIONAL TESTS:  3 minute walk test: 220 feet had to stop at 30 seconds 01/13/23 = 440 feet   GAIT: Distance walked: 220 feet Assistive device utilized: Single point cane Level of assistance: Complete Independence Comments: stopped due to back pain  TODAY'S TREATMENT:                                                                                                                              DATE:   01/13/23 Gait one short lap around the back building one rest due to fatigue and some cramping  in the thighs coming up the hill Nustep level 5 x 10 minutes Feet on ball K2C, rotation, posterior isometrics, abdominal isometrics Passive stretch LE's very tight and sore today  01/07/23 Bike Level 4 x 3 minutes had to stop due to knee pain Leg curls 25# 3x10 Rows 25# 3x10 Lats 20# 3x10 Black tband lumbar extension Black tband trunk flexion 3# standing march and hip abduction 3# LAQ Nustep level 5 x 8 mintues Feet on ball K2C, rotation, bridge and isometric abs  01/01/23 Nustep level 5 x 8 minutes 5# straight arm pulls on the airex  cues for core and posture Leg press 20# 2x10 Leg curls 20# 2x10 Tried extension but this caused some knee pain 3# LAQ 3# hip extension 3# hip abduction Gait outside around the parking [place] some pain coming up slope 25# rows 25# lats Black tband lumbar extension Bike x 3 minutes difficulty with knee ROM  12/30/22 Nustep level 4 x 9 minutes 5# straight arm pulls with cues for core and posture 20# 2x10 triceps 5# biceps 2x10 Feet on ball K2C, rotation, bridge, iso abs Passive stretch of the HS and piriformis Pateint was frustrated about the lack of weight loss, we talked about her choices and the weight that she has lost is sustainable and what she can do to keep up the momentum that she has  12/23/22 Amb outside 6 min, slower than normal d/t pain Sitting on dynadisc pelvic ROM 15 x 4 way Red tband scap stab on dynadisc 4 ways 15 x each Feet on ball bridge, KTC, obl, iso abdominals PROM LE and trunk- gentle  12/16/22 Nustep L 5 UBE L4 6 min Black tband trunk extension and flexion 20 x Seated row 20# 2x15 Lats 20# 2x15 Farmers carry 7# 2 laps each hand STS with wt ball press 12 x Wt ball OH ext 10 x Wt ball trunk rotation 10 x each    12/09/22 Nustep level 5 x 10 minutes UBE level 4 x 6 minutes Black tband trunk extension and flexion Seated row 20# 2x15 Lats 20# 2x15 Calf stretches Worked on getting up and down from the floor used the large mat quadruple folded, she could kneel and get up with bed and then kneel and get up without the bed Ball squeeze with bridges Isometric abs Passive HS and piriformis stretches  PATIENT EDUCATION:  Education details: POC Person educated: Patient Education method: Explanation Education comprehension: verbalized understanding  HOME EXERCISE PROGRAM: TBD  ASSESSMENT:  CLINICAL IMPRESSION: Doing better, she was able to walk a longer distance with a short rest due to fatigue, she did c/o cramping in the adductors, they are  super tight and very tender.  I discussed light stretches at home and the use of a massager to help with the tightness and the pain and cramps, overall she reports that she is doing more and having less pain OBJECTIVE IMPAIRMENTS: Abnormal gait, cardiopulmonary status limiting activity, decreased activity tolerance, decreased balance, decreased coordination, decreased endurance, decreased mobility, difficulty walking, decreased ROM, decreased strength, increased fascial restrictions, impaired perceived functional ability, increased muscle spasms, impaired flexibility, improper body mechanics, and pain.   REHAB POTENTIAL: Good  CLINICAL DECISION MAKING: Stable/uncomplicated  EVALUATION COMPLEXITY: Low   GOALS: Goals reviewed with patient? Yes  SHORT TERM GOALS: Target date: 11/24/22  Independent with initial HEP Goal status: 11/27/22 MET  LONG TERM GOALS: Target date: 02/11/23  Independent iwht posture and body mechanics Goal status:met 01/01/23  2.  Walk 500 feet  in a 3 minute walk test Goal status:ongoing 01/13/23  3.  Decrease pain 25% Goal status: ongoing 12/30/22  4.  Be able to cook a meal without pain > 5/10 Goal status: progressing 01/07/23  5.  Independent with HEP or gym Goal status: progressing 01/07/23  PLAN:  PT FREQUENCY: 1-2x/week  PT DURATION: 12 weeks  PLANNED INTERVENTIONS: Therapeutic exercises, Therapeutic activity, Neuromuscular re-education, Balance training, Gait training, Patient/Family education, Self Care, Joint mobilization, Dry Needling, Electrical stimulation, Cryotherapy, Moist heat, and Manual therapy.  PLAN FOR NEXT SESSION: continue to push her to be more active and as functional as possible may look at requesting more visits if needed in the future   Jearld Lesch, PT 01/13/2023, 9:37 AM [place]

## 2023-01-15 ENCOUNTER — Ambulatory Visit: Payer: Medicare PPO | Admitting: Physical Therapy

## 2023-01-15 DIAGNOSIS — R262 Difficulty in walking, not elsewhere classified: Secondary | ICD-10-CM

## 2023-01-15 DIAGNOSIS — M62830 Muscle spasm of back: Secondary | ICD-10-CM | POA: Diagnosis not present

## 2023-01-15 DIAGNOSIS — M25561 Pain in right knee: Secondary | ICD-10-CM | POA: Diagnosis not present

## 2023-01-15 DIAGNOSIS — M5459 Other low back pain: Secondary | ICD-10-CM

## 2023-01-15 NOTE — Therapy (Signed)
OUTPATIENT PHYSICAL THERAPY THORACOLUMBAR TREATMENT    Patient Name: Jenna Mcdonald MRN: [id number] DOB:February 06, 1946, 77 y.o., female Today's Date: 01/15/2023  END OF SESSION:  PT End of Session - 01/15/23 0920     Visit Number 11    Date for PT Re-Evaluation 01/27/23    Authorization Type [employer]    PT Start Time 0928    PT Stop Time 1013    PT Time Calculation (min) 45 min             Past Medical History:  Diagnosis Date   Anemia    Aortic stenosis    mild by echo 09/2020 and 08/2022 with mean AVG   Breast CA (HCC)    left   Breast cancer (HCC)    Carotid stenosis    1-39% left   Degenerative disc disease, lumbar    of the knees   Diabetes mellitus    Dysrhythmia    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HCC)    Encounter for therapeutic drug monitoring 08/17/2018   Fatigue    Severe secondary to to gemfibrozil   Gastroesophageal reflux disease    denies   GI bleeding    a. 06/2018: EGD showed multiple nonbleeding duodenal ulcers. 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   Hammertoe    left second toe   Hypercholesteremia    Hypertension    Insomnia    Irritable bowel    Low back pain    Metabolic syndrome    Morbid obesity (HCC)    OSA on CPAP \   bipap   Osteoarthritis    Osteopenia    Persistent atrial fibrillation [facility])    s/p afib ablation - Dr. Johney Frame 08/2018   Personal history of radiation therapy    Pneumonia    PONV (postoperative nausea and vomiting)    Sigmoid diverticulosis    Thalassemia minor    Unstable angina (HCC) 10/23/2020   Vitamin D deficiency    Past Surgical History:  Procedure Laterality Date   ATRIAL FIBRILLATION ABLATION N/A 09/21/2018   Procedure: ATRIAL FIBRILLATION ABLATION;  Surgeon: Hillis Range, MD;  Location: [facility] INVASIVE CV LAB;  Service: Cardiovascular;  Laterality: N/A;   BIOPSY  07/06/2018   Procedure: BIOPSY;  Surgeon: Vida Rigger, MD;  Location:  [facility] ENDOSCOPY;  Service: Endoscopy;;   BREAST LUMPECTOMY Left 05/28/2009   BREAST SURGERY Left 2010   lumpectomy   CARDIOVERSION N/A 07/07/2018   Procedure: CARDIOVERSION;  Surgeon: Chilton Si, MD;  Location: [facility] ENDOSCOPY;  Service: Cardiovascular;  Laterality: N/A;   CARDIOVERSION N/A 07/14/2018   Procedure: CARDIOVERSION;  Surgeon: Laurey Morale, MD;  Location: [facility] ENDOSCOPY;  Service: Cardiovascular;  Laterality: N/A;   CESAREAN SECTION     COLONOSCOPY WITH PROPOFOL N/A 07/06/2018   Procedure: COLONOSCOPY WITH PROPOFOL;  Surgeon: Vida Rigger, MD;  Location: [facility] ENDOSCOPY;  Service: Endoscopy;  Laterality: N/A;   CYSTECTOMY  1970   pilonidal    DILATION AND CURETTAGE OF UTERUS     ESOPHAGOGASTRODUODENOSCOPY (EGD) WITH PROPOFOL N/A 07/06/2018   Procedure: ESOPHAGOGASTRODUODENOSCOPY (EGD) WITH PROPOFOL;  Surgeon: Vida Rigger, MD;  Location: [facility] ENDOSCOPY;  Service: Endoscopy;  Laterality: N/A;   EYE SURGERY Bilateral 07/2013   cataracts   HOT HEMOSTASIS N/A 07/06/2018   Procedure: HOT HEMOSTASIS (ARGON PLASMA COAGULATION/BICAP);  Surgeon: Vida Rigger, MD;  Location: [facility] ENDOSCOPY;  Service: Endoscopy;  Laterality: N/A;   implantable loop recorder placement  01/15/2022   Medtronic Reveal Linq  model [id number] implantable loop recorder (SN [id number]) implanted for AF management by Dr Johney Frame   MAXIMUM ACCESS (MAS)POSTERIOR LUMBAR INTERBODY FUSION (PLIF) 2 LEVEL N/A 09/22/2013   Procedure: FOR MAXIMUM ACCESS  POSTERIOR LUMBAR INTERBODY FUSION LUMBAR THREE-FOUR FOUR-FIVE;  Surgeon: Tia Alert, MD;  Location: [facility] NEURO ORS;  Service: Neurosurgery;  Laterality: N/A;   OOPHORECTOMY     wedge section not rem   REPLACEMENT TOTAL KNEE Left 2011   REPLACEMENT TOTAL KNEE     RIGHT/LEFT HEART CATH AND CORONARY ANGIOGRAPHY N/A 10/26/2020   Procedure: RIGHT/LEFT HEART CATH AND CORONARY ANGIOGRAPHY;  Surgeon: Marykay Lex, MD;  Location: [facility] INVASIVE CV LAB;  Service: Cardiovascular;  Laterality:  N/A;   TEE WITHOUT CARDIOVERSION N/A 07/07/2018   Procedure: TRANSESOPHAGEAL ECHOCARDIOGRAM (TEE);  Surgeon: Chilton Si, MD;  Location: [facility] ENDOSCOPY;  Service: Cardiovascular;  Laterality: N/A;   TEE WITHOUT CARDIOVERSION N/A 09/21/2018   Procedure: TRANSESOPHAGEAL ECHOCARDIOGRAM (TEE);  Surgeon: Elease Hashimoto Deloris Ping, MD;  Location: [facility] ENDOSCOPY;  Service: Cardiovascular;  Laterality: N/A;   TONSILLECTOMY     TUBAL LIGATION     Patient Active Problem List   Diagnosis Date Noted   Shortness of breath    Unstable angina (HCC) 10/23/2020   Secondary hypercoagulable state (HCC) 10/15/2020   Encounter for therapeutic drug monitoring 08/17/2018   CKD (chronic kidney disease) stage 3, GFR 30-59 ml/min (HCC) 07/21/2018   Chronic anemia 07/21/2018   Anemia    Heme positive stool    Atrial fibrillation with RVR (HCC) 07/04/2018   Carotid stenosis    Bruit of left carotid artery 09/22/2017   Aortic stenosis    Heart murmur 09/09/2016   Morbid obesity (HCC) 02/05/2016   Dry eye 05/17/2015   Type 2 diabetes mellitus without complication (HCC) 08/10/2014   Breast cancer, left breast (HCC) 07/22/2014   Obesity, Class III, BMI 40-49.9 (morbid obesity) (HCC) 07/06/2014   Macular pseudohole 05/17/2014   History of surgical procedure 05/17/2014   OSA (obstructive sleep apnea) 11/17/2013   S/P lumbar spinal fusion 09/22/2013   Persistent atrial fibrillation (HCC) 09/11/2011    Class: Acute   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HCC) 09/11/2011    Class: Chronic   Essential hypertension 09/11/2011   Exogenous obesity 09/11/2011   Gastroesophageal reflux disease 09/11/2011   Thalassemia minor 09/11/2011    Class: Chronic   Breast cancer (HCC) 09/11/2011    Class: Chronic   Degenerative joint disease 09/11/2011    Class: Chronic   Irritable bowel disease 09/11/2011    Class: Chronic   Lumbar disc disease 09/11/2011    PCP: Margaretann Loveless, MD  REFERRING PROVIDER: Baird Lyons, DO  REFERRING DIAG: chronic bag  and knee pain  Rationale for Evaluation and Treatment: Rehabilitation  THERAPY DIAG:  Other low back pain  Difficulty in walking, not elsewhere classified  ONSET DATE: 10/14/22  SUBJECTIVE:  SUBJECTIVE STATEMENT: last session really helped my cramps , but now pain in RT LB/buttock. Knees okay- working to get gel inj again  PERTINENT HISTORY:  See above  PAIN:  Are you having pain? Back 9/10 RT , knees okay   PRECAUTIONS: Other: lumbar fusion 2015  WEIGHT BEARING RESTRICTIONS: No  FALLS:  Has patient fallen in last 6 months? No  LIVING ENVIRONMENT: Lives with: lives with their family Lives in: House/apartment Stairs: Yes: Internal: 15 steps; can reach both Has following equipment at home: Single point cane and uses the cane for long distances grocery shopping  OCCUPATION: retired  PLOF: Independent and gardens, does Presenter, broadcasting, cooks and cleans  PATIENT GOALS: strengthen core and have less pain  NEXT MD VISIT: 11/18/22  OBJECTIVE:   DIAGNOSTIC FINDINGS:  No recent x-rays   PATIENT SURVEYS:  ODI = 70% disability  COGNITION: Overall cognitive status: Within functional limits for tasks assessed     SENSATION: WFL  MUSCLE LENGTH: Very tight HS, piriformis and quads  POSTURE: rounded shoulders and forward head  PALPATION: Tender and tight in the low back mms, tightness iin the rhomboids and the upper traps  LUMBAR ROM:   AROM eval 12/09/22  Flexion Decreased 50% 25%  Extension Decreased 100% Decreased 100%  Right lateral flexion Decreased 75% Decreased 75%  Left lateral flexion Decreased 75% Decreased 75%  Right rotation    Left rotation     (Blank rows = not tested)  LOWER EXTREMITY ROM:     Active  Right eval Left eval  Hip flexion    Hip extension    Hip  abduction    Hip adduction    Hip internal rotation    Hip external rotation    Knee flexion 10 5  Knee extension 95 100  Ankle dorsiflexion    Ankle plantarflexion    Ankle inversion    Ankle eversion     (Blank rows = not tested)  LOWER EXTREMITY MMT:    MMT Right eval Left eval  Hip flexion 4- 4-  Hip extension 4- 4-  Hip abduction 4- 4-  Hip adduction    Hip internal rotation    Hip external rotation    Knee flexion 3+ 4-  Knee extension 3+ 4-  Ankle dorsiflexion 4 4  Ankle plantarflexion    Ankle inversion    Ankle eversion     (Blank rows = not tested)  FUNCTIONAL TESTS:  3 minute walk test: 220 feet had to stop at 30 seconds 01/13/23 = 440 feet   GAIT: Distance walked: 220 feet Assistive device utilized: Single point cane Level of assistance: Complete Independence Comments: stopped due to back pain  TODAY'S TREATMENT:                                                                                                                              DATE:    01/15/23 Nustep L 6 10 min PROM  LE and trunk, STW to RT LB and buttuck with and without theragun- very tender Ppt, ppt with marching and clams- green tband Feet on ball small bridge,K2C, rotation, posterior isometrics, abdominal isometrics Rows 25# 3x10 Lats 20# 3x10 Black tband lumbar extension Black tband trunk flexion DN RT LB by Octavio Graves PT    01/13/23 Gait one short lap around the back building one rest due to fatigue and some cramping in the thighs coming up the hill Nustep level 5 x 10 minutes Feet on ball K2C, rotation, posterior isometrics, abdominal isometrics Passive stretch LE's very tight and sore today  01/07/23 Bike Level 4 x 3 minutes had to stop due to knee pain Leg curls 25# 3x10 Rows 25# 3x10 Lats 20# 3x10 Black tband lumbar extension Black tband trunk flexion 3# standing march and hip abduction 3# LAQ Nustep level 5 x 8 mintues Feet on ball K2C, rotation, bridge and  isometric abs  01/01/23 Nustep level 5 x 8 minutes 5# straight arm pulls on the airex cues for core and posture Leg press 20# 2x10 Leg curls 20# 2x10 Tried extension but this caused some knee pain 3# LAQ 3# hip extension 3# hip abduction Gait outside around the parking [place] some pain coming up slope 25# rows 25# lats Black tband lumbar extension Bike x 3 minutes difficulty with knee ROM  12/30/22 Nustep level 4 x 9 minutes 5# straight arm pulls with cues for core and posture 20# 2x10 triceps 5# biceps 2x10 Feet on ball K2C, rotation, bridge, iso abs Passive stretch of the HS and piriformis Pateint was frustrated about the lack of weight loss, we talked about her choices and the weight that she has lost is sustainable and what she can do to keep up the momentum that she has  12/23/22 Amb outside 6 min, slower than normal d/t pain Sitting on dynadisc pelvic ROM 15 x 4 way Red tband scap stab on dynadisc 4 ways 15 x each Feet on ball bridge, KTC, obl, iso abdominals PROM LE and trunk- gentle  12/16/22 Nustep L 5 UBE L4 6 min Black tband trunk extension and flexion 20 x Seated row 20# 2x15 Lats 20# 2x15 Farmers carry 7# 2 laps each hand STS with wt ball press 12 x Wt ball OH ext 10 x Wt ball trunk rotation 10 x each    12/09/22 Nustep level 5 x 10 minutes UBE level 4 x 6 minutes Black tband trunk extension and flexion Seated row 20# 2x15 Lats 20# 2x15 Calf stretches Worked on getting up and down from the floor used the large mat quadruple folded, she could kneel and get up with bed and then kneel and get up without the bed Ball squeeze with bridges Isometric abs Passive HS and piriformis stretches  PATIENT EDUCATION:  Education details: POC Person educated: Patient Education method: Explanation Education comprehension: verbalized understanding  HOME EXERCISE PROGRAM: TBD  ASSESSMENT:  CLINICAL IMPRESSION: pt arrived feeling better with less cramping  but pain in RT LB/buttock.progressed func and strength with STW and stretching to address tightness- very tender RT buttock. Ex as tolerted but some pain and limitations in reps on RT side vs left. DN to try and alleviate pain   OBJECTIVE IMPAIRMENTS: Abnormal gait, cardiopulmonary status limiting activity, decreased activity tolerance, decreased balance, decreased coordination, decreased endurance, decreased mobility, difficulty walking, decreased ROM, decreased strength, increased fascial restrictions, impaired perceived functional ability, increased muscle spasms, impaired flexibility, improper body mechanics, and pain.   REHAB POTENTIAL:  Good  CLINICAL DECISION MAKING: Stable/uncomplicated  EVALUATION COMPLEXITY: Low   GOALS: Goals reviewed with patient? Yes  SHORT TERM GOALS: Target date: 11/24/22  Independent with initial HEP Goal status: 11/27/22 MET  LONG TERM GOALS: Target date: 02/11/23  Independent iwht posture and body mechanics Goal status:met 01/01/23  2.  Walk 500 feet in a 3 minute walk test Goal status:ongoing 01/13/23  3.  Decrease pain 25% Goal status: ongoing 12/30/22  4.  Be able to cook a meal without pain > 5/10 Goal status: progressing 01/07/23  5.  Independent with HEP or gym Goal status: progressing 01/07/23  PLAN:  PT FREQUENCY: 1-2x/week  PT DURATION: 12 weeks  PLANNED INTERVENTIONS: Therapeutic exercises, Therapeutic activity, Neuromuscular re-education, Balance training, Gait training, Patient/Family education, Self Care, Joint mobilization, Dry Needling, Electrical stimulation, Cryotherapy, Moist heat, and Manual therapy.  PLAN FOR NEXT SESSION: continue to push her to be more active and as functional as possible may look at requesting more visits if needed in the future   Swetha Rayle,ANGIE, PTA 01/15/2023, 9:21 AM [facility] at [facility] [address] [facility] [place], [place], [address] Phone:  [phone]   Fax:  [phone]  Patient Details  Name: Kaiden Licea MRN: [id number] Date of Birth: 29-Dec-1945 Referring Provider:  Thana Ates, MD  Encounter Date: 01/15/2023   Suanne Marker, PTA 01/15/2023, 9:21 AM  [facility] at [facility] [address] [place], [place], [address] Phone: [phone]   Fax:  [phone]

## 2023-01-16 LAB — CUP PACEART REMOTE DEVICE CHECK
Date Time Interrogation Session: 20240523230604
Implantable Pulse Generator Implant Date: 20230524

## 2023-01-20 ENCOUNTER — Ambulatory Visit (INDEPENDENT_AMBULATORY_CARE_PROVIDER_SITE_OTHER): Payer: Medicare PPO

## 2023-01-20 ENCOUNTER — Ambulatory Visit: Payer: Medicare PPO | Admitting: Physical Therapy

## 2023-01-20 DIAGNOSIS — I480 Paroxysmal atrial fibrillation: Secondary | ICD-10-CM | POA: Diagnosis not present

## 2023-01-21 ENCOUNTER — Ambulatory Visit: Payer: Medicare PPO | Admitting: Physical Therapy

## 2023-01-21 ENCOUNTER — Encounter: Payer: Self-pay | Admitting: Physical Therapy

## 2023-01-21 DIAGNOSIS — M5459 Other low back pain: Secondary | ICD-10-CM | POA: Diagnosis not present

## 2023-01-21 DIAGNOSIS — M62830 Muscle spasm of back: Secondary | ICD-10-CM | POA: Diagnosis not present

## 2023-01-21 DIAGNOSIS — M25561 Pain in right knee: Secondary | ICD-10-CM | POA: Diagnosis not present

## 2023-01-21 DIAGNOSIS — R262 Difficulty in walking, not elsewhere classified: Secondary | ICD-10-CM | POA: Diagnosis not present

## 2023-01-21 NOTE — Progress Notes (Signed)
[facility] Summary Report / Loop Recorder 

## 2023-01-21 NOTE — Therapy (Signed)
OUTPATIENT PHYSICAL THERAPY THORACOLUMBAR TREATMENT    Patient Name: Jenna Mcdonald MRN: [id number] DOB:Sep 13, 1945, 77 y.o., female Today's Date: 01/21/2023  END OF SESSION:  PT End of Session - 01/21/23 1314     Visit Number 12    Number of Visits 13    Date for PT Re-Evaluation 03/13/23    Authorization Type [employer]    PT Start Time 1310    PT Stop Time 1400    PT Time Calculation (min) 50 min    Activity Tolerance Patient tolerated treatment well    Behavior During Therapy [facility] for tasks assessed/performed             Past Medical History:  Diagnosis Date   Anemia    Aortic stenosis    mild by echo 09/2020 and 08/2022 with mean AVG   Breast CA (HCC)    left   Breast cancer (HCC)    Carotid stenosis    1-39% left   Degenerative disc disease, lumbar    of the knees   Diabetes mellitus    Dysrhythmia    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HCC)    Encounter for therapeutic drug monitoring 08/17/2018   Fatigue    Severe secondary to to gemfibrozil   Gastroesophageal reflux disease    denies   GI bleeding    a. 06/2018: EGD showed multiple nonbleeding duodenal ulcers. 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   Hammertoe    left second toe   Hypercholesteremia    Hypertension    Insomnia    Irritable bowel    Low back pain    Metabolic syndrome    Morbid obesity (HCC)    OSA on CPAP \   bipap   Osteoarthritis    Osteopenia    Persistent atrial fibrillation [facility])    s/p afib ablation - Dr. Johney Frame 08/2018   Personal history of radiation therapy    Pneumonia    PONV (postoperative nausea and vomiting)    Sigmoid diverticulosis    Thalassemia minor    Unstable angina (HCC) 10/23/2020   Vitamin D deficiency    Past Surgical History:  Procedure Laterality Date   ATRIAL FIBRILLATION ABLATION N/A 09/21/2018   Procedure: ATRIAL FIBRILLATION ABLATION;  Surgeon: Hillis Range, MD;  Location: [facility]  INVASIVE CV LAB;  Service: Cardiovascular;  Laterality: N/A;   BIOPSY  07/06/2018   Procedure: BIOPSY;  Surgeon: Vida Rigger, MD;  Location: [facility] ENDOSCOPY;  Service: Endoscopy;;   BREAST LUMPECTOMY Left 05/28/2009   BREAST SURGERY Left 2010   lumpectomy   CARDIOVERSION N/A 07/07/2018   Procedure: CARDIOVERSION;  Surgeon: Chilton Si, MD;  Location: [facility] ENDOSCOPY;  Service: Cardiovascular;  Laterality: N/A;   CARDIOVERSION N/A 07/14/2018   Procedure: CARDIOVERSION;  Surgeon: Laurey Morale, MD;  Location: [facility] ENDOSCOPY;  Service: Cardiovascular;  Laterality: N/A;   CESAREAN SECTION     COLONOSCOPY WITH PROPOFOL N/A 07/06/2018   Procedure: COLONOSCOPY WITH PROPOFOL;  Surgeon: Vida Rigger, MD;  Location: [facility] ENDOSCOPY;  Service: Endoscopy;  Laterality: N/A;   CYSTECTOMY  1970   pilonidal    DILATION AND CURETTAGE OF UTERUS     ESOPHAGOGASTRODUODENOSCOPY (EGD) WITH PROPOFOL N/A 07/06/2018   Procedure: ESOPHAGOGASTRODUODENOSCOPY (EGD) WITH PROPOFOL;  Surgeon: Vida Rigger, MD;  Location: [facility] ENDOSCOPY;  Service: Endoscopy;  Laterality: N/A;   EYE SURGERY Bilateral 07/2013   cataracts   HOT HEMOSTASIS N/A 07/06/2018   Procedure: HOT HEMOSTASIS (ARGON PLASMA COAGULATION/BICAP);  Surgeon:  Vida Rigger, MD;  Location: [facility] ENDOSCOPY;  Service: Endoscopy;  Laterality: N/A;   implantable loop recorder placement  01/15/2022   [employer] Reveal Linq model [id number] implantable loop recorder (SN [id number]) implanted for AF management by Dr Johney Frame   MAXIMUM ACCESS (MAS)POSTERIOR LUMBAR INTERBODY FUSION (PLIF) 2 LEVEL N/A 09/22/2013   Procedure: FOR MAXIMUM ACCESS  POSTERIOR LUMBAR INTERBODY FUSION LUMBAR THREE-FOUR FOUR-FIVE;  Surgeon: Tia Alert, MD;  Location: [facility] NEURO ORS;  Service: Neurosurgery;  Laterality: N/A;   OOPHORECTOMY     wedge section not rem   REPLACEMENT TOTAL KNEE Left 2011   REPLACEMENT TOTAL KNEE     RIGHT/LEFT HEART CATH AND CORONARY ANGIOGRAPHY N/A 10/26/2020   Procedure:  RIGHT/LEFT HEART CATH AND CORONARY ANGIOGRAPHY;  Surgeon: Marykay Lex, MD;  Location: [facility] INVASIVE CV LAB;  Service: Cardiovascular;  Laterality: N/A;   TEE WITHOUT CARDIOVERSION N/A 07/07/2018   Procedure: TRANSESOPHAGEAL ECHOCARDIOGRAM (TEE);  Surgeon: Chilton Si, MD;  Location: [facility] ENDOSCOPY;  Service: Cardiovascular;  Laterality: N/A;   TEE WITHOUT CARDIOVERSION N/A 09/21/2018   Procedure: TRANSESOPHAGEAL ECHOCARDIOGRAM (TEE);  Surgeon: Elease Hashimoto Deloris Ping, MD;  Location: [facility] ENDOSCOPY;  Service: Cardiovascular;  Laterality: N/A;   TONSILLECTOMY     TUBAL LIGATION     Patient Active Problem List   Diagnosis Date Noted   Shortness of breath    Unstable angina (HCC) 10/23/2020   Secondary hypercoagulable state (HCC) 10/15/2020   Encounter for therapeutic drug monitoring 08/17/2018   CKD (chronic kidney disease) stage 3, GFR 30-59 ml/min (HCC) 07/21/2018   Chronic anemia 07/21/2018   Anemia    Heme positive stool    Atrial fibrillation with RVR (HCC) 07/04/2018   Carotid stenosis    Bruit of left carotid artery 09/22/2017   Aortic stenosis    Heart murmur 09/09/2016   Morbid obesity (HCC) 02/05/2016   Dry eye 05/17/2015   Type 2 diabetes mellitus without complication (HCC) 08/10/2014   Breast cancer, left breast (HCC) 07/22/2014   Obesity, Class III, BMI 40-49.9 (morbid obesity) (HCC) 07/06/2014   Macular pseudohole 05/17/2014   History of surgical procedure 05/17/2014   OSA (obstructive sleep apnea) 11/17/2013   S/P lumbar spinal fusion 09/22/2013   Persistent atrial fibrillation (HCC) 09/11/2011    Class: Acute   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HCC) 09/11/2011    Class: Chronic   Essential hypertension 09/11/2011   Exogenous obesity 09/11/2011   Gastroesophageal reflux disease 09/11/2011   Thalassemia minor 09/11/2011    Class: Chronic   Breast cancer (HCC) 09/11/2011    Class: Chronic   Degenerative joint disease 09/11/2011    Class: Chronic   Irritable bowel disease  09/11/2011    Class: Chronic   Lumbar disc disease 09/11/2011    PCP: Margaretann Loveless, MD  REFERRING PROVIDER: Baird Lyons, DO  REFERRING DIAG: chronic bag and knee pain  Rationale for Evaluation and Treatment: Rehabilitation  THERAPY DIAG:  Other low back pain  Difficulty in walking, not elsewhere classified  Acute pain of right knee  Muscle spasm of back  ONSET DATE: 10/14/22  SUBJECTIVE:  SUBJECTIVE STATEMENT: I am getting better, just have ups and downs with the back and the knees, cramps at times really get my thighs  PERTINENT HISTORY:  See above  PAIN:  Are you having pain? Back 9/10 RT , knees okay   PRECAUTIONS: Other: lumbar fusion 2015  WEIGHT BEARING RESTRICTIONS: No  FALLS:  Has patient fallen in last 6 months? No  LIVING ENVIRONMENT: Lives with: lives with their family Lives in: House/apartment Stairs: Yes: Internal: 15 steps; can reach both Has following equipment at home: Single point cane and uses the cane for long distances grocery shopping  OCCUPATION: retired  PLOF: Independent and gardens, does Presenter, broadcasting, cooks and cleans  PATIENT GOALS: strengthen core and have less pain  NEXT MD VISIT: 11/18/22  OBJECTIVE:   DIAGNOSTIC FINDINGS:  No recent x-rays   PATIENT SURVEYS:  ODI = 70% disability  COGNITION: Overall cognitive status: Within functional limits for tasks assessed     SENSATION: WFL  MUSCLE LENGTH: Very tight HS, piriformis and quads  POSTURE: rounded shoulders and forward head  PALPATION: Tender and tight in the low back mms, tightness iin the rhomboids and the upper traps  LUMBAR ROM:   AROM eval 12/09/22  Flexion Decreased 50% 25%  Extension Decreased 100% Decreased 100%  Right lateral flexion Decreased 75% Decreased 75%  Left lateral  flexion Decreased 75% Decreased 75%  Right rotation    Left rotation     (Blank rows = not tested)  LOWER EXTREMITY ROM:     Active  Right eval Left eval  Hip flexion    Hip extension    Hip abduction    Hip adduction    Hip internal rotation    Hip external rotation    Knee flexion 10 5  Knee extension 95 100  Ankle dorsiflexion    Ankle plantarflexion    Ankle inversion    Ankle eversion     (Blank rows = not tested)  LOWER EXTREMITY MMT:    MMT Right eval Left eval  Hip flexion 4- 4-  Hip extension 4- 4-  Hip abduction 4- 4-  Hip adduction    Hip internal rotation    Hip external rotation    Knee flexion 3+ 4-  Knee extension 3+ 4-  Ankle dorsiflexion 4 4  Ankle plantarflexion    Ankle inversion    Ankle eversion     (Blank rows = not tested)  FUNCTIONAL TESTS:  3 minute walk test: 220 feet had to stop at 30 seconds 01/13/23 = 440 feet 6 minute walk test = 600 feet TUG = 16 seconds   GAIT: Distance walked: 220 feet Assistive device utilized: Single point cane Level of assistance: Complete Independence Comments: stopped due to back pain  TODAY'S TREATMENT:  DATE:   01/21/23 Nustep level 5 x 10 minutes Black tband lumbar extension Leg curls 20# 3x10 Green tband clamshells Supine green tband hip extension Bridges Passive stretch LE's STM to the right buttock and lumbar area  01/15/23 Nustep L 6 10 min PROM LE and trunk, STW to RT LB and buttuck with and without theragun- very tender Ppt, ppt with marching and clams- green tband Feet on ball small bridge,K2C, rotation, posterior isometrics, abdominal isometrics Rows 25# 3x10 Lats 20# 3x10 Black tband lumbar extension Black tband trunk flexion DN RT LB by Octavio Graves PT  01/13/23 Gait one short lap around the back building one rest due to fatigue and some cramping  in the thighs coming up the hill Nustep level 5 x 10 minutes Feet on ball K2C, rotation, posterior isometrics, abdominal isometrics Passive stretch LE's very tight and sore today  01/07/23 Bike Level 4 x 3 minutes had to stop due to knee pain Leg curls 25# 3x10 Rows 25# 3x10 Lats 20# 3x10 Black tband lumbar extension Black tband trunk flexion 3# standing march and hip abduction 3# LAQ Nustep level 5 x 8 mintues Feet on ball K2C, rotation, bridge and isometric abs  01/01/23 Nustep level 5 x 8 minutes 5# straight arm pulls on the airex cues for core and posture Leg press 20# 2x10 Leg curls 20# 2x10 Tried extension but this caused some knee pain 3# LAQ 3# hip extension 3# hip abduction Gait outside around the parking [place] some pain coming up slope 25# rows 25# lats Black tband lumbar extension Bike x 3 minutes difficulty with knee ROM  12/30/22 Nustep level 4 x 9 minutes 5# straight arm pulls with cues for core and posture 20# 2x10 triceps 5# biceps 2x10 Feet on ball K2C, rotation, bridge, iso abs Passive stretch of the HS and piriformis Pateint was frustrated about the lack of weight loss, we talked about her choices and the weight that she has lost is sustainable and what she can do to keep up the momentum that she has  12/23/22 Amb outside 6 min, slower than normal d/t pain Sitting on dynadisc pelvic ROM 15 x 4 way Red tband scap stab on dynadisc 4 ways 15 x each Feet on ball bridge, KTC, obl, iso abdominals PROM LE and trunk- gentle  12/16/22 Nustep L 5 UBE L4 6 min Black tband trunk extension and flexion 20 x Seated row 20# 2x15 Lats 20# 2x15 Farmers carry 7# 2 laps each hand STS with wt ball press 12 x Wt ball OH ext 10 x Wt ball trunk rotation 10 x each  PATIENT EDUCATION:  Education details: POC Person educated: Patient Education method: Explanation Education comprehension: verbalized understanding  HOME EXERCISE  PROGRAM: TBD  ASSESSMENT:  CLINICAL IMPRESSION: Patient has really been doing better, less overall pain, still with some LBP and some knee pain at times that limits, she is very tight in the LE's and we are starting to work on this.  She has improved her 3 minute walk test and was able to complete a 6 minute walk test.  She has been able to resume some gardening.  OBJECTIVE IMPAIRMENTS: Abnormal gait, cardiopulmonary status limiting activity, decreased activity tolerance, decreased balance, decreased coordination, decreased endurance, decreased mobility, difficulty walking, decreased ROM, decreased strength, increased fascial restrictions, impaired perceived functional ability, increased muscle spasms, impaired flexibility, improper body mechanics, and pain.   REHAB POTENTIAL: Good  CLINICAL DECISION MAKING: Stable/uncomplicated  EVALUATION COMPLEXITY: Low   GOALS: Goals  reviewed with patient? Yes  SHORT TERM GOALS: Target date: 11/24/22  Independent with initial HEP Goal status: 11/27/22 MET  LONG TERM GOALS: Target date: 02/11/23  Independent iwht posture and body mechanics Goal status:met 01/01/23  2.  Walk 500 feet in a 3 minute walk test Goal status:ongoing 01/21/23  3.  Decrease pain 25% Goal status: progressing 01/21/23  4.  Be able to cook a meal without pain > 5/10 Goal status: progressing 01/21/23  5.  Independent with HEP or gym Goal status: progressing 01/07/23  PLAN:  PT FREQUENCY: 1-2x/week  PT DURATION: 12 weeks  PLANNED INTERVENTIONS: Therapeutic exercises, Therapeutic activity, Neuromuscular re-education, Balance training, Gait training, Patient/Family education, Self Care, Joint mobilization, Dry Needling, Electrical stimulation, Cryotherapy, Moist heat, and Manual therapy.  PLAN FOR NEXT SESSION: continue to push her to be more active and as functional as possible may look at requesting more visits if needed in the future   Jearld Lesch,  PT 01/21/2023, 1:16 PM [facility] [address], [place], [address] Phone: [phone]   Fax:  [phone]

## 2023-01-22 ENCOUNTER — Ambulatory Visit: Payer: Medicare PPO | Admitting: Physical Therapy

## 2023-01-25 ENCOUNTER — Emergency Department (HOSPITAL_COMMUNITY): Payer: Medicare PPO

## 2023-01-25 ENCOUNTER — Emergency Department (HOSPITAL_COMMUNITY)
Admission: EM | Admit: 2023-01-25 | Discharge: 2023-01-25 | Disposition: A | Discharge status: Home / Self Care | Payer: Medicare PPO | Attending: Emergency Medicine | Admitting: Emergency Medicine

## 2023-01-25 DIAGNOSIS — R42 Dizziness and giddiness: Secondary | ICD-10-CM | POA: Diagnosis not present

## 2023-01-25 DIAGNOSIS — E119 Type 2 diabetes mellitus without complications: Secondary | ICD-10-CM | POA: Insufficient documentation

## 2023-01-25 DIAGNOSIS — Z853 Personal history of malignant neoplasm of breast: Secondary | ICD-10-CM | POA: Diagnosis not present

## 2023-01-25 DIAGNOSIS — I499 Cardiac arrhythmia, unspecified: Secondary | ICD-10-CM | POA: Diagnosis not present

## 2023-01-25 DIAGNOSIS — Z7901 Long term (current) use of anticoagulants: Secondary | ICD-10-CM | POA: Insufficient documentation

## 2023-01-25 DIAGNOSIS — R079 Chest pain, unspecified: Secondary | ICD-10-CM | POA: Diagnosis not present

## 2023-01-25 DIAGNOSIS — I10 Essential (primary) hypertension: Secondary | ICD-10-CM | POA: Diagnosis not present

## 2023-01-25 DIAGNOSIS — R000 Tachycardia, unspecified: Secondary | ICD-10-CM | POA: Diagnosis not present

## 2023-01-25 DIAGNOSIS — R0789 Other chest pain: Secondary | ICD-10-CM | POA: Diagnosis present

## 2023-01-25 DIAGNOSIS — I480 Paroxysmal atrial fibrillation: Secondary | ICD-10-CM | POA: Diagnosis not present

## 2023-01-25 DIAGNOSIS — R002 Palpitations: Secondary | ICD-10-CM | POA: Diagnosis not present

## 2023-01-25 DIAGNOSIS — I4891 Unspecified atrial fibrillation: Secondary | ICD-10-CM | POA: Diagnosis not present

## 2023-01-25 LAB — COMPREHENSIVE METABOLIC PANEL
ALT: 18 U/L (ref 0–44)
AST: 19 U/L (ref 15–41)
Albumin: 3.7 g/dL (ref 3.5–5.0)
Alkaline Phosphatase: 77 U/L (ref 38–126)
Anion gap: 14 (ref 5–15)
BUN: 27 mg/dL — ABNORMAL HIGH (ref 8–23)
CO2: 23 mmol/L (ref 22–32)
Calcium: 9.7 mg/dL (ref 8.9–10.3)
Chloride: 105 mmol/L (ref 98–111)
Creatinine, Ser: 1.47 mg/dL — ABNORMAL HIGH (ref 0.44–1.00)
GFR, Estimated: 37 mL/min — ABNORMAL LOW (ref >60)
Glucose, Bld: 148 mg/dL — ABNORMAL HIGH (ref 70–99)
Potassium: 4.3 mmol/L (ref 3.5–5.1)
Sodium: 142 mmol/L (ref 135–145)
Total Bilirubin: 0.8 mg/dL (ref 0.3–1.2)
Total Protein: 6.4 g/dL — ABNORMAL LOW (ref 6.5–8.1)

## 2023-01-25 LAB — CBC WITH DIFFERENTIAL/PLATELET
Abs Immature Granulocytes: 0.03 10*3/uL (ref 0.00–0.07)
Basophils Absolute: 0.1 10*3/uL (ref 0.0–0.1)
Basophils Relative: 1 %
Eosinophils Absolute: 0 10*3/uL (ref 0.0–0.5)
Eosinophils Relative: 1 %
HCT: 38 % (ref 36.0–46.0)
Hemoglobin: 11.2 g/dL — ABNORMAL LOW (ref 12.0–15.0)
Immature Granulocytes: 0 %
Lymphocytes Relative: 17 %
Lymphs Abs: 1.2 10*3/uL (ref 0.7–4.0)
MCH: 18.7 pg — ABNORMAL LOW (ref 26.0–34.0)
MCHC: 29.5 g/dL — ABNORMAL LOW (ref 30.0–36.0)
MCV: 63.3 fL — ABNORMAL LOW (ref 80.0–100.0)
Monocytes Absolute: 0.5 10*3/uL (ref 0.1–1.0)
Monocytes Relative: 8 %
Neutro Abs: 5.3 10*3/uL (ref 1.7–7.7)
Neutrophils Relative %: 73 %
Platelets: 180 10*3/uL (ref 150–400)
RBC: 6 MIL/uL — ABNORMAL HIGH (ref 3.87–5.11)
RDW: 17.6 % — ABNORMAL HIGH (ref 11.5–15.5)
WBC: 7.1 10*3/uL (ref 4.0–10.5)
nRBC: 0 % (ref 0.0–0.2)

## 2023-01-25 LAB — MAGNESIUM: Magnesium: 2 mg/dL (ref 1.7–2.4)

## 2023-01-25 LAB — TROPONIN I (HIGH SENSITIVITY): Troponin I (High Sensitivity): 33 ng/L — ABNORMAL HIGH (ref <18)

## 2023-01-25 LAB — TSH: TSH: 1.706 u[IU]/mL (ref 0.350–4.500)

## 2023-01-25 MED ORDER — ETOMIDATE 2 MG/ML IV SOLN
INTRAVENOUS | Status: DC | PRN
  Administered 2023-01-25: 10 mg via INTRAVENOUS

## 2023-01-25 MED ORDER — DILTIAZEM HCL 30 MG PO TABS
30.0000 mg | ORAL_TABLET | Freq: Once | ORAL | Status: AC
  Administered 2023-01-25: 30 mg via ORAL
  Filled 2023-01-25: qty 1

## 2023-01-25 MED ORDER — APIXABAN 5 MG PO TABS: 5.0000 mg | ORAL_TABLET | Freq: Two times a day (BID) | ORAL | 1 refills | Status: DC

## 2023-01-25 MED ORDER — ETOMIDATE 2 MG/ML IV SOLN
10.0000 mg | Freq: Once | INTRAVENOUS | Status: AC
  Administered 2023-01-25: 10 mg via INTRAVENOUS
  Filled 2023-01-25: qty 10

## 2023-01-25 MED ORDER — LACTATED RINGERS IV BOLUS
1000.0000 mL | Freq: Once | INTRAVENOUS | Status: AC
  Administered 2023-01-25: 1000 mL via INTRAVENOUS

## 2023-01-25 MED ORDER — APIXABAN 5 MG PO TABS
5.0000 mg | ORAL_TABLET | Freq: Two times a day (BID) | ORAL | Status: DC
  Administered 2023-01-25: 5 mg via ORAL
  Filled 2023-01-25: qty 1

## 2023-01-25 MED ORDER — DILTIAZEM HCL 25 MG/5ML IV SOLN
10.0000 mg | Freq: Once | INTRAVENOUS | Status: AC
  Administered 2023-01-25: 10 mg via INTRAVENOUS
  Filled 2023-01-25: qty 5

## 2023-01-25 MED ORDER — ONDANSETRON HCL 4 MG/2ML IJ SOLN
4.0000 mg | Freq: Once | INTRAMUSCULAR | Status: AC
  Administered 2023-01-25: 4 mg via INTRAVENOUS
  Filled 2023-01-25: qty 2

## 2023-01-25 MED ORDER — APIXABAN (ELIQUIS) EDUCATION KIT FOR DVT/PE PATIENTS: PACK | Freq: Once | Status: DC

## 2023-01-25 NOTE — Consult Note (Signed)
Cardiology Consultation   Patient ID: Jenna Mcdonald MRN: [id number]; DOB: 21-Mar-1946  Admit date: 01/25/2023 Date of Consult: 01/25/2023  PCP:  Thana Ates, MD   [facility] Providers Cardiologist:  Armanda Magic, MD  Electrophysiologist:  Maurice Small, MD       Patient Profile:   Jenna Mcdonald is a 77 y.o. female with OSA on BiPAP, HTN, pAF s/p AF ablation (2020), and mild AS who is being seen 01/25/2023 for the evaluation of AF with RVR at the request of emergency department.  History of Present Illness:   Jenna Mcdonald was in her USOH until the evening prior to presentation when she developed chest discomfort and palpitations with associated occasional dizziness.  She thought she was in atrial fibrillation at that time, but was not sure.  Her symptoms persisted throughout the night into the morning.  She decided to finally present to the emergency room, when she felt presyncopal while making a cake in the kitchen.  She states that she is normally able to tell when she goes into atrial fibrillation, but has been free from atrial arrhythmias since her ablation in 2020.  She is fairly certain that her atrial fibrillation started when her symptoms started yesterday evening at 6:30 PM.  She is not currently on anticoagulation, but is on diltiazem at home.       Past Medical History:  Diagnosis Date   Anemia    Aortic stenosis    mild by echo 09/2020 and 08/2022 with mean AVG   Breast CA (HCC)    left   Breast cancer (HCC)    Carotid stenosis    1-39% left   Degenerative disc disease, lumbar    of the knees   Diabetes mellitus    Dysrhythmia    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HCC)    Encounter for therapeutic drug monitoring 08/17/2018   Fatigue    Severe secondary to to gemfibrozil   Gastroesophageal reflux disease    denies   GI bleeding    a. 06/2018: EGD showed multiple nonbleeding duodenal ulcers. Colonoscopy showed diverticulosis and  multiple colonic angiodysplastic lesion treated with argon plasma.   Hammertoe    left second toe   Hypercholesteremia    Hypertension    Insomnia    Irritable bowel    Low back pain    Metabolic syndrome    Morbid obesity (HCC)    OSA on CPAP \   bipap   Osteoarthritis    Osteopenia    Persistent atrial fibrillation [facility]    s/p afib ablation - Dr. Johney Frame 08/2018   Personal history of radiation therapy    Pneumonia    PONV (postoperative nausea and vomiting)    Sigmoid diverticulosis    Thalassemia minor    Unstable angina (HCC) 10/23/2020   Vitamin D deficiency     Past Surgical History:  Procedure Laterality Date   ATRIAL FIBRILLATION ABLATION N/A 09/21/2018   Procedure: ATRIAL FIBRILLATION ABLATION;  Surgeon: Hillis Range, MD;  Location: [facility] INVASIVE CV LAB;  Service: Cardiovascular;  Laterality: N/A;   BIOPSY  07/06/2018   Procedure: BIOPSY;  Surgeon: Vida Rigger, MD;  Location: [facility] ENDOSCOPY;  Service: Endoscopy;;   BREAST LUMPECTOMY Left 05/28/2009   BREAST SURGERY Left 2010   lumpectomy   CARDIOVERSION N/A 07/07/2018   Procedure: CARDIOVERSION;  Surgeon: Chilton Si, MD;  Location: [facility] ENDOSCOPY;  Service: Cardiovascular;  Laterality: N/A;   CARDIOVERSION N/A 07/14/2018   Procedure: CARDIOVERSION;  Surgeon: Laurey Morale, MD;  Location: [facility] ENDOSCOPY;  Service: Cardiovascular;  Laterality: N/A;   CESAREAN SECTION     COLONOSCOPY WITH PROPOFOL N/A 07/06/2018   Procedure: COLONOSCOPY WITH PROPOFOL;  Surgeon: Vida Rigger, MD;  Location: [facility] ENDOSCOPY;  Service: Endoscopy;  Laterality: N/A;   CYSTECTOMY  1970   pilonidal    DILATION AND CURETTAGE OF UTERUS     ESOPHAGOGASTRODUODENOSCOPY (EGD) WITH PROPOFOL N/A 07/06/2018   Procedure: ESOPHAGOGASTRODUODENOSCOPY (EGD) WITH PROPOFOL;  Surgeon: Vida Rigger, MD;  Location: [facility] ENDOSCOPY;  Service: Endoscopy;  Laterality: N/A;   EYE SURGERY Bilateral 07/2013   cataracts   HOT HEMOSTASIS N/A 07/06/2018   Procedure:  HOT HEMOSTASIS (ARGON PLASMA COAGULATION/BICAP);  Surgeon: Vida Rigger, MD;  Location: [facility] ENDOSCOPY;  Service: Endoscopy;  Laterality: N/A;   implantable loop recorder placement  01/15/2022   Medtronic Reveal Linq model [id number] implantable loop recorder (SN [id number]) implanted for AF management by Dr Johney Frame   MAXIMUM ACCESS (MAS)POSTERIOR LUMBAR INTERBODY FUSION (PLIF) 2 LEVEL N/A 09/22/2013   Procedure: FOR MAXIMUM ACCESS  POSTERIOR LUMBAR INTERBODY FUSION LUMBAR THREE-FOUR FOUR-FIVE;  Surgeon: Tia Alert, MD;  Location: [facility] NEURO ORS;  Service: Neurosurgery;  Laterality: N/A;   OOPHORECTOMY     wedge section not rem   REPLACEMENT TOTAL KNEE Left 2011   REPLACEMENT TOTAL KNEE     RIGHT/LEFT HEART CATH AND CORONARY ANGIOGRAPHY N/A 10/26/2020   Procedure: RIGHT/LEFT HEART CATH AND CORONARY ANGIOGRAPHY;  Surgeon: Marykay Lex, MD;  Location: [facility] INVASIVE CV LAB;  Service: Cardiovascular;  Laterality: N/A;   TEE WITHOUT CARDIOVERSION N/A 07/07/2018   Procedure: TRANSESOPHAGEAL ECHOCARDIOGRAM (TEE);  Surgeon: Chilton Si, MD;  Location: [facility] ENDOSCOPY;  Service: Cardiovascular;  Laterality: N/A;   TEE WITHOUT CARDIOVERSION N/A 09/21/2018   Procedure: TRANSESOPHAGEAL ECHOCARDIOGRAM (TEE);  Surgeon: Elease Hashimoto Deloris Ping, MD;  Location: [facility] ENDOSCOPY;  Service: Cardiovascular;  Laterality: N/A;   TONSILLECTOMY     TUBAL LIGATION       Home Medications:  Prior to Admission medications   Medication Sig Start Date End Date Taking? Authorizing Provider  betamethasone dipropionate 0.05 % lotion Apply topically as needed. 05/01/22   [provider]  Calcium Carbonate-Vitamin D (CALCIUM 600 + D PO) Take 1 tablet by mouth daily.     [provider]  Cholecalciferol (VITAMIN D3) 25 MCG (1000 UT) CAPS Take 1 capsule by mouth every morning.    [provider]  diltiazem (CARDIZEM CD) 120 MG 24 hr capsule TAKE 1 CAPSULE BY MOUTH EVERY DAY 05/01/22   Allred, Fayrene Fearing, MD   diphenhydramine-acetaminophen (TYLENOL PM) 25-500 MG TABS tablet Take 2 tablets by mouth as needed (sleep).     [provider]  ketoconazole (NIZORAL) 2 % shampoo Apply 1 Application topically as needed. 03/19/22   [provider]  Multiple Vitamin (MULTIVITAMIN) tablet Take 1 tablet by mouth daily.    [provider]  [facility] ULTRA test strip 1 each by Other route daily. 04/22/19   [provider]  tirzepatide Greggory Keen) 5 MG/0.5ML Pen Inject 5 mg into the skin once a week. 01/09/23     TRULICITY 1.5 MG/0.5ML SOPN Inject 1.5 mg into the skin once a week. 09/15/22   [provider]  zolpidem (AMBIEN) 10 MG tablet Take 1 tablet by mouth as needed. 01/06/22   [provider]    Inpatient Medications: Scheduled Meds:  diltiazem  10 mg Intravenous Once   etomidate  10 mg Intravenous Once   ondansetron (  ZOFRAN) IV  4 mg Intravenous Once   Continuous Infusions:  PRN Meds:   Allergies:    Allergies  Allergen Reactions   Metoprolol Shortness Of Breath, Diarrhea and Other (See Comments)    Fatigue    Amiodarone Other (See Comments)   Anastrozole Other (See Comments)    Other Reaction(s): Fatigue   Calcitriol Other (See Comments)   Furosemide     Other Reaction(s): cramps   Jardiance [Empagliflozin] Other (See Comments)    Severe muscle and bone pain   Tamoxifen Other (See Comments)    Bone and body aches    Tetracyclines & Related Other (See Comments)    Vaginal infections     Toprol Xl [Metoprolol Tartrate] Other (See Comments)    Hair loss   Verapamil Other (See Comments)    Unknown   Vicodin [Hydrocodone-Acetaminophen] Nausea And Vomiting and Other (See Comments)    Patient reports terrible nausea; plain tylenol is ok with patient   Aromasin [Exemestane] Other (See Comments)    Hair loss    Gemfibrozil Other (See Comments)    Severe fatigue   Other Other (See Comments)    Numerous cancer drugs - unknown reaction     Social History:   Social History   Socioeconomic History   Marital status: Widowed    Spouse name: Not on file   Number of children: Not on file   Years of education: Not on file   Highest education level: Not on file  Occupational History   Not on file  Tobacco Use   Smoking status: Former    Packs/day: 0.50    Years: 10.00    Additional pack years: 0.00    Total pack years: 5.00    Types: Cigarettes    Quit date: 11/12/2003    Years since quitting: 19.2   Smokeless tobacco: Never  Vaping Use   Vaping Use: Never used  Substance and Sexual Activity   Alcohol use: Yes    Comment: occasionally   Drug use: No   Sexual activity: Not Currently    Birth control/protection: Post-menopausal  Other Topics Concern   Not on file  Social History Narrative   Not on file   Social Determinants of Health   Financial Resource Strain: Not on file  Food Insecurity: Not on file  Transportation Needs: Not on file  Physical Activity: Not on file  Stress: Not on file  Social Connections: Not on file  Intimate Partner Violence: Not on file    Family History:    Family History  Problem Relation Age of Onset   Anesthesia problems Mother    Diabetes Mother    Diabetes Father      ROS:  Please see the history of present illness.   All other ROS reviewed and negative.     Physical Exam/Data:   Vitals:   01/25/23 1754 01/25/23 1800 01/25/23 1815 01/25/23 1900  BP: 123/72 131/74 (!) 145/84   Pulse: (!) 131 86 74 82  Resp: 15 (!) 22 19 (!) 24  Temp: 98.1 F (36.7 C)     TempSrc: Oral     SpO2: 100% 99% 100% 99%   No intake or output data in the 24 hours ending 01/25/23 1924    09/22/2022    8:33 AM 09/04/2022   10:32 AM 07/14/2022    9:44 AM  Last 3 Weights  Weight (lbs) 218 lb 9.6 oz 229 lb 230 lb 12.8 oz  Weight (kg) 99.156 kg 103.874  kg 104.69 kg     There is no height or weight on file to calculate BMI.  General:  Well nourished, well developed, in no acute  distress HEENT: normal Cardiac:  normal S1, S2; tachycardic, irregular rhythm; III/VI murmur appreciated best at RUSB, no rubs/gallops Lungs:  clear to auscultation bilaterally, no wheezing, rhonchi or rales  Abd: soft, nontender, no hepatomegaly  Ext: no edema Musculoskeletal:  No deformities, BUE and BLE strength normal and equal Skin: warm and dry  Neuro:  No focal abnormalities noted Psych:  Normal affect   EKG:  The EKG was personally reviewed . Telemetry:  Telemetry was personally reviewed.   Laboratory Data:  High Sensitivity Troponin:   Recent Labs  Lab 01/25/23 1807  TROPONINIHS 33*     Chemistry Recent Labs  Lab 01/25/23 1807  NA 142  K 4.3  CL 105  CO2 23  GLUCOSE 148*  BUN 27*  CREATININE 1.47*  CALCIUM 9.7  MG 2.0  GFRNONAA 37*  ANIONGAP 14    Recent Labs  Lab 01/25/23 1807  PROT 6.4*  ALBUMIN 3.7  AST 19  ALT 18  ALKPHOS 77  BILITOT 0.8   Lipids No results for input(s): "CHOL", "TRIG", "HDL", "LABVLDL", "LDLCALC", "CHOLHDL" in the last 168 hours.  Hematology Recent Labs  Lab 01/25/23 1807  WBC 7.1  RBC 6.00*  HGB 11.2*  HCT 38.0  MCV 63.3*  MCH 18.7*  MCHC 29.5*  RDW 17.6*  PLT 180   Thyroid No results for input(s): "TSH", "FREET4" in the last 168 hours.  BNPNo results for input(s): "BNP", "PROBNP" in the last 168 hours.  DDimer No results for input(s): "DDIMER" in the last 168 hours.   Radiology/Studies:  DG CHEST PORT 1 VIEW  Result Date: 01/25/2023 CLINICAL DATA:  Atrial fibrillation. Chest pain and palpitations. Dizziness. EXAM: PORTABLE CHEST 1 VIEW COMPARISON:  10/23/2020 FINDINGS: A loop recorder is present. Borderline heart size with normal pulmonary vascularity. Lungs are clear. No pleural effusions. No pneumothorax. Mediastinal contours appear intact. Calcified aorta. Degenerative changes in the spine and shoulders. IMPRESSION: Borderline heart size.  No evidence of active pulmonary disease. Electronically Signed   By:  Burman Nieves M.D.   On: 01/25/2023 18:33     Assessment and Plan:   Jenna Mcdonald is a 77 y.o. female with OSA on BiPAP, HTN, pAF s/p AF ablation (2020), and mild AS who is being seen 01/25/2023 for the evaluation of AF with RVR.  Given her atrial fibrillation has been ongoing for while under 48 hours, I recommended starting anticoagulation with apixaban 5 mg twice daily and DC cardioversion. The patient was amenable to this course of action and all questions were answered.  Start anticoagulation with apixaban 5 BID DCCV in the ED  Continue home diltiazem Discharge home later today, if stable Follow-up with outpatient cardiology    Risk Assessment/Risk Scores:          CHA2DS2-VASc Score =  3  For questions or updates, please contact [facility] Please consult [email or web address] for contact info under    Signed, Ralene Ok, MD  01/25/2023 7:24 PM

## 2023-01-25 NOTE — Sedation Documentation (Signed)
Pt shocked at 120J; Pt Returned to NSR at 2054 per Dr. Doran Durand

## 2023-01-25 NOTE — Discharge Instructions (Signed)
You were seen today for palpitations.  You were in atrial fibrillation and were cardioverted.  You are being started on a blood thinner called Eliquis.  You should start the medication tomorrow morning and take it twice daily.  You will receive a phone call to schedule follow-up with cardiology, you should also follow-up with your primary care provider.  If you develop repeat palpitations, dizziness, fainting, chest pain, difficulty breathing you should return to the ED.  You are at an increased risk of bleeding while taking the Eliquis.

## 2023-01-25 NOTE — ED Provider Notes (Signed)
[place] EMERGENCY DEPARTMENT AT [facility] Provider Note   CSN: [id number] Arrival date & time: 01/25/23  1743     History  Chief Complaint  Patient presents with   Chest Pain    Jenna Mcdonald is a 77 y.o. female.  77 year old female history of OSA on BiPAP, hypertension, paroxysmal A-fib status post ablation 2020 on diltiazem no anticoagulation, mild AS, obesity, breast cancer, diabetes, hypertension, hyperlipidemia presenting for chest pain palpitations.  Patient states around 6:30 PM last night she had sudden onset palpitations.  She had some intermittent chest tightness.  Today she had some dizziness and lightheadedness so called EMS.  With EMS she was tachycardic in atrial fibrillation.  She was given aspirin.  She currently has no chest pain but still has palpitations.  No dizziness or shortness of breath currently.  No pleuritic pain or history of DVT or PE.  No lower extremity edema.  She does take diltiazem 120 mg long-acting daily in the mornings which she took this morning.  She is not on anticoagulation.  No fevers or chills.  She does report she has had poor p.o. intake recently after starting Mounjaro.  No vomiting or diarrhea.   Chest Pain Associated symptoms: palpitations        Home Medications Prior to Admission medications   Medication Sig Start Date End Date Taking? Authorizing Provider  bismuth subsalicylate (PEPTO BISMOL) 262 MG chewable tablet Chew 262-524 mg by mouth 3 (three) times daily as needed for indigestion.   Yes [provider]  Calcium Carbonate-Vitamin D (CALCIUM 600 + D PO) Take 1 tablet by mouth daily.    Yes [provider]  Cholecalciferol (VITAMIN D3) 25 MCG (1000 UT) CAPS Take 1,000 Units by mouth every morning.   Yes [provider]  diltiazem (CARDIZEM CD) 120 MG 24 hr capsule TAKE 1 CAPSULE BY MOUTH EVERY DAY Patient taking differently: Take 120 mg by mouth daily. 05/01/22  Yes Allred, Fayrene Fearing,  MD  diphenhydramine-acetaminophen (TYLENOL PM) 25-500 MG TABS tablet Take 2 tablets by mouth at bedtime as needed (sleep).   Yes [provider]  Multiple Vitamin (MULTIVITAMIN) tablet Take 1 tablet by mouth daily with breakfast.   Yes [provider]  ondansetron (ZOFRAN) 4 MG tablet Take 4 mg by mouth every 8 (eight) hours as needed for nausea or vomiting.   Yes [provider]  tirzepatide Greggory Keen) 5 MG/0.5ML Pen Inject 5 mg into the skin once a week. Patient taking differently: Inject 5 mg into the skin every Sunday. 01/09/23  Yes   zolpidem (AMBIEN) 5 MG tablet Take 5 mg by mouth at bedtime as needed for sleep.   Yes [provider]  apixaban (ELIQUIS) 5 MG TABS tablet Take 1 tablet (5 mg total) by mouth 2 (two) times daily. 01/25/23   Fulton Reek, MD  [facility] ULTRA test strip 1 each by Other route daily. 04/22/19   [provider]      Allergies    Metoprolol, Amiodarone, Anastrozole, Calcitriol, Furosemide, Hydrocodone-acetaminophen, Jardiance [empagliflozin], Tamoxifen, Tetracyclines & related, Toprol xl [metoprolol tartrate], Verapamil, Vicodin [hydrocodone-acetaminophen], Aromasin [exemestane], Gemfibrozil, and Other    Review of Systems   Review of Systems  Cardiovascular:  Positive for chest pain and palpitations.  All other systems reviewed and are negative.   Physical Exam Updated Vital Signs BP (!) 112/57   Pulse 83   Temp 97.8 F (36.6 C) (Oral)   Resp 17   SpO2 98%  Physical Exam  Vitals and nursing note reviewed.  Constitutional:      General: She is not in acute distress.    Appearance: She is well-developed.  HENT:     Head: Normocephalic and atraumatic.  Eyes:     Conjunctiva/sclera: Conjunctivae normal.  Cardiovascular:     Rate and Rhythm: Tachycardia present. Rhythm irregular.     Pulses:          Radial pulses are 2+ on the right side and 2+ on the left side.       Dorsalis pedis pulses are 2+ on the  right side and 2+ on the left side.     Heart sounds: No murmur heard. Pulmonary:     Effort: Pulmonary effort is normal. No respiratory distress.     Breath sounds: Normal breath sounds.  Abdominal:     Palpations: Abdomen is soft.     Tenderness: There is no abdominal tenderness.  Musculoskeletal:        General: No swelling.     Cervical back: Neck supple.     Right lower leg: No edema.     Left lower leg: No edema.  Skin:    General: Skin is warm and dry.     Capillary Refill: Capillary refill takes less than 2 seconds.  Neurological:     General: No focal deficit present.     Mental Status: She is alert and oriented to person, place, and time.  Psychiatric:        Mood and Affect: Mood normal.     ED Results / Procedures / Treatments   Labs (all labs ordered are listed, but only abnormal results are displayed) Labs Reviewed  CBC WITH DIFFERENTIAL/PLATELET - Abnormal; Notable for the following components:      Result Value   RBC 6.00 (*)    Hemoglobin 11.2 (*)    MCV 63.3 (*)    MCH 18.7 (*)    MCHC 29.5 (*)    RDW 17.6 (*)    All other components within normal limits  COMPREHENSIVE METABOLIC PANEL - Abnormal; Notable for the following components:   Glucose, Bld 148 (*)    BUN 27 (*)    Creatinine, Ser 1.47 (*)    Total Protein 6.4 (*)    GFR, Estimated 37 (*)    All other components within normal limits  TROPONIN I (HIGH SENSITIVITY) - Abnormal; Notable for the following components:   Troponin I (High Sensitivity) 33 (*)    All other components within normal limits  TSH  MAGNESIUM    EKG EKG Interpretation  Date/Time:  Sunday January 25 2023 17:52:26 EDT Ventricular Rate:  128 PR Interval:    QRS Duration: 84 QT Interval:  280 QTC Calculation: 409 R Axis:   76 Text Interpretation: Atrial fibrillation Nonspecific repol abnormality, diffuse leads Confirmed by Glyn Ade [phone]) on 01/25/2023 6:58:02 PM  Radiology DG CHEST PORT 1 VIEW  Result  Date: 01/25/2023 CLINICAL DATA:  Atrial fibrillation. Chest pain and palpitations. Dizziness. EXAM: PORTABLE CHEST 1 VIEW COMPARISON:  10/23/2020 FINDINGS: A loop recorder is present. Borderline heart size with normal pulmonary vascularity. Lungs are clear. No pleural effusions. No pneumothorax. Mediastinal contours appear intact. Calcified aorta. Degenerative changes in the spine and shoulders. IMPRESSION: Borderline heart size.  No evidence of active pulmonary disease. Electronically Signed   By: Burman Nieves M.D.   On: 01/25/2023 18:33    Procedures .Sedation  Date/Time: 01/25/2023 10:30 PM  Performed by: Fulton Reek, MD Authorized by:  Glyn Ade, MD   Consent:    Consent obtained:  Written   Consent given by:  Patient   Risks discussed:  Allergic reaction, prolonged hypoxia resulting in organ damage, prolonged sedation necessitating reversal, dysrhythmia, inadequate sedation, respiratory compromise necessitating ventilatory assistance and intubation, vomiting and nausea   Alternatives discussed:  Analgesia without sedation Universal protocol:    Procedure explained and questions answered to patient or proxy's satisfaction: yes     Immediately prior to procedure, a time out was called: yes     Patient identity confirmed:  Arm band, hospital-assigned identification number and verbally with patient Indications:    Procedure performed:  Cardioversion   Procedure necessitating sedation performed by:  Physician performing sedation Pre-sedation assessment:    Time since last food or drink:  4 hours   ASA classification: class 2 - patient with mild systemic disease     Mouth opening:  3 or more finger widths   Thyromental distance:  4 finger widths   Mallampati score:  II - soft palate, uvula, fauces visible   Neck mobility: normal     Pre-sedation assessments completed and reviewed: airway patency, cardiovascular function, hydration status, mental status, nausea/vomiting, pain  level, respiratory function and temperature   Immediate pre-procedure details:    Reassessment: Patient reassessed immediately prior to procedure     Reviewed: vital signs, relevant labs/tests and NPO status     Verified: bag valve mask available, emergency equipment available, intubation equipment available, IV patency confirmed, oxygen available, reversal medications available and suction available   Procedure details (see MAR for exact dosages):    Preoxygenation:  Nasal cannula   Sedation:  Etomidate   Intended level of sedation: moderate (conscious sedation)   Analgesia:  None   Intra-procedure monitoring:  Blood pressure monitoring, cardiac monitor, continuous pulse oximetry, continuous capnometry, frequent LOC assessments and frequent vital sign checks   Intra-procedure events: none     Total Provider sedation time (minutes):  10 Post-procedure details:    Attendance: Constant attendance by certified staff until patient recovered     Recovery: Patient returned to pre-procedure baseline     Post-sedation assessments completed and reviewed: airway patency, cardiovascular function, hydration status, mental status, nausea/vomiting, pain level, respiratory function and temperature     Patient is stable for discharge or admission: yes     Procedure completion:  Tolerated well, no immediate complications .Cardioversion  Date/Time: 01/25/2023 10:32 PM  Performed by: Fulton Reek, MD Authorized by: Glyn Ade, MD   Consent:    Consent obtained:  Written   Consent given by:  Patient   Risks discussed:  Death, induced arrhythmia and pain   Alternatives discussed:  Rate-control medication Universal protocol:    Procedure explained and questions answered to patient or proxy's satisfaction: yes     Patient identity confirmed:  Verbally with patient and hospital-assigned identification number Pre-procedure details:    Cardioversion basis:  Elective   Rhythm:  Atrial fibrillation    Electrode placement:  Anterior-posterior Patient sedated: Yes. Refer to sedation procedure documentation for details of sedation.  Attempt one:    Cardioversion mode:  Synchronous   Waveform:  Biphasic   Shock (Joules):  120   Shock outcome:  Conversion to normal sinus rhythm Post-procedure details:    Patient status:  Awake   Patient tolerance of procedure:  Tolerated well, no immediate complications     Medications Ordered in ED Medications  etomidate (AMIDATE) injection (10 mg Intravenous Given 01/25/23 2054)  apixaban (  ELIQUIS) tablet 5 mg (5 mg Oral Given 01/25/23 2228)  lactated ringers bolus 1,000 mL (0 mLs Intravenous Stopped 01/25/23 2052)  diltiazem (CARDIZEM) injection 10 mg (10 mg Intravenous Given 01/25/23 1923)  etomidate (AMIDATE) injection 10 mg (10 mg Intravenous Given 01/25/23 2056)  ondansetron (ZOFRAN) injection 4 mg (4 mg Intravenous Given 01/25/23 2049)  diltiazem (CARDIZEM) tablet 30 mg (30 mg Oral Given 01/25/23 2149)    ED Course/ Medical Decision Making/ A&P Clinical Course as of 01/25/23 2232  Sun Jan 25, 2023  1807 EKG atrial fibrillation rate 28, inferolateral ST depression and T wave inversion.  No STEMI. [JD]  1808 Stable 77 YOF with hx of afib Here with palpitations and recurrence of afib.  [CC]  1857 Evaluated at bedside.  Agree with plan per conversation with resident.  Diltiazem bolus, diltiazem infusion, conversation with cardiology regarding anticoagulation and cardioversion plans. She is uncertain how long her symptoms have been going on but she does have a loop recorder.  May need interrogation. [CC]    Clinical Course User Index [CC] Glyn Ade, MD [JD] Fulton Reek, MD                             Medical Decision Making Amount and/or Complexity of Data Reviewed Labs: ordered. Radiology: ordered.  Risk Prescription drug management.   77 year old female with history of paroxysmal A-fib presenting for palpitations, chest pain since  6:30 PM yesterday.  Status post aspirin, no active chest pain.  Her EKG shows atrial fibrillation with inferolateral ST depression likely rate related.  I suspect her pain and presyncope earlier was related to recurrent A-fib.  She is not anticoagulated.  She does report some recent decreased p.o. intake, consider possible dehydration will trial fluids.  Lab work reviewed, shows mildly elevated troponin but no active chest pain, I think this is likely demand in setting of atrial fibrillation.  CMP notable for creatinine 1.4 from 1.2.  CBC with stable anemia.  TSH, magnesium are unremarkable.  Her chest x-ray was reviewed, no signs of acute abnormality.  We did consult cardiology who evaluated the patient.  They felt that since her symptoms were onset around 6 PM yesterday within 48 hours, that she was a candidate for cardioversion.  I discussed with the patient and obtain informed consent for cardioversion and procedural sedation.  Discussed risks including stroke, arrhythmia, recurrent atrial fibrillation.  Also discussed risk of sedation including low blood pressure, hypoxia, decreased respiratory rate.  Patient was amenable to procedural sedation and cardioversion.  She was sedated with etomidate and cardioverted as above.  Post cardioversion EKG shows normal sinus rhythm.  She was observed in the ED for over an hour after cardioversion.  She is awake and alert.  No signs of stroke.  Will start on Eliquis, urgent cardiology referral placed.  Discharged with strict return precautions and in stable condition.        Final Clinical Impression(s) / ED Diagnoses Final diagnoses:  Paroxysmal atrial fibrillation (HCC)  Atrial fibrillation with RVR (HCC)    Rx / DC Orders ED Discharge Orders          Ordered    apixaban (ELIQUIS) 5 MG TABS tablet  2 times daily,   Status:  Discontinued        01/25/23 2212    Ambulatory referral to Cardiology       Comments: If you have not heard from the  Cardiology office within  the next 72 hours please call [phone].   01/25/23 2213    apixaban (ELIQUIS) 5 MG TABS tablet  2 times daily        01/25/23 2215              Fulton Reek, MD 01/25/23 6578    Glyn Ade, MD 01/25/23 2242

## 2023-01-25 NOTE — ED Triage Notes (Signed)
Pt to ED via EMS from home. Pt c/o chest pain, and palpations since yesterday and dizziness that started today. Pt is in a fib, and has hx of same. Pt's HR 130-150 with EMS in a fib. Pt has hx of ablation and takes diltiazem at home. Pt states she has been having trouble staying hydrated at home since started mounjaro at home 2 weeks ago.   EMS Vitals: 134/70 130-150 HR 100% RA 192 CBG 20 RFA

## 2023-01-26 ENCOUNTER — Other Ambulatory Visit (HOSPITAL_BASED_OUTPATIENT_CLINIC_OR_DEPARTMENT_OTHER): Payer: Self-pay

## 2023-01-26 ENCOUNTER — Telehealth: Payer: Self-pay | Admitting: Cardiology

## 2023-01-26 NOTE — Telephone Encounter (Signed)
Patient called and wants to talk with Dr. Mayford Knife or nurse. Was not satisfied with appt that was given which was on 6/11. Said that she needed something sooner

## 2023-01-26 NOTE — Telephone Encounter (Signed)
Follow Up:     Patient is retuning  call from this morning.

## 2023-01-26 NOTE — Telephone Encounter (Signed)
Left message for patient to call back  

## 2023-01-27 ENCOUNTER — Ambulatory Visit (HOSPITAL_COMMUNITY)
Admission: RE | Admit: 2023-01-27 | Discharge: 2023-01-27 | Disposition: A | Discharge status: Home / Self Care | Payer: Medicare PPO | Source: Ambulatory Visit | Attending: Internal Medicine | Admitting: Internal Medicine

## 2023-01-27 VITALS — BP 158/74 | HR 73 | Ht 64.5 in | Wt 210.6 lb

## 2023-01-27 DIAGNOSIS — I480 Paroxysmal atrial fibrillation: Secondary | ICD-10-CM | POA: Diagnosis not present

## 2023-01-27 DIAGNOSIS — D6869 Other thrombophilia: Secondary | ICD-10-CM | POA: Diagnosis not present

## 2023-01-27 DIAGNOSIS — I10 Essential (primary) hypertension: Secondary | ICD-10-CM | POA: Insufficient documentation

## 2023-01-27 DIAGNOSIS — Z7901 Long term (current) use of anticoagulants: Secondary | ICD-10-CM | POA: Insufficient documentation

## 2023-01-27 DIAGNOSIS — I4819 Other persistent atrial fibrillation: Secondary | ICD-10-CM | POA: Insufficient documentation

## 2023-01-27 MED ORDER — APIXABAN 5 MG PO TABS: 5.0000 mg | ORAL_TABLET | Freq: Two times a day (BID) | ORAL | 3 refills | Status: DC

## 2023-01-27 NOTE — Progress Notes (Signed)
Primary Care Physician: Thana Ates, MD Referring Physician: [facility] f/u EP: Dr. Nelly Laurence Cardiologist: Dr.Turner   Jenna Mcdonald is a 77 y.o. female with a h/o persistent afib that was  hospitalized 07/20/18  for failure of flecainide to maintain  SR. She was started on amiodarone. Dr. Johney Frame consulted and thought pt may be an ablation candidate.  She is on warfarin.   I last saw pt  10/19/18. She was s/p ablation x one month. She reports that she had done exceptionally well. She had not noted any afib. She was having however, some GI issues that she thought was from amiodarone and stopped drug.   I am now seeing pt, 10/10/20, at the request of Dr. Mayford Knife. She was having an echo yesterday and the echo tech noted irregular heart beat and reported she was in afib, but I feel it  was the premature beats , not afib. Ekg  today shows SR with frequent PVC's. She recently had a stress test and echo for reporting to Dr. Mayford Knife that she is short of breath walking to her mailbox with pressure in her chest for the last last several weeks. PC's were noted at time of stress test as well.  She reports that she has gained around 20 lbs over the last year. Her BP is very elevated today. She checks very rarely;y at home. Her stress test did not show any ischemia but EF was reported moderately decreased. Dr. Mayford Knife thought it was 2/2 to the PC's and wanted to see EF from echo, results  are pending from yesterday. She has OSA and is using CPAP.   Follow up in the AF clinic 10/15/20. Patient presented to ED on 10/12/20 with chest heaviness and dyspnea on exertion. ACS was ruled out but she did have evidence of vascular congestion on CXR. She refused Lasix and was discharged home. ECG showed SR with bigeminal PVCs. She continues to have dyspnea with exertion and orthopnea.   Follow up in the AF clinic 07/14/22. Patient reports that she has had pain on the left side of her chest and up into her shoulder where her  ILR is located. She states it started when the device was implanted. The ILR shows 0% afib burden. She is off anticoagulation. She as lost ~22 lbs over the past year.   F/U in Afib clinic, 01/27/23. She is s/p ED visit on 6/2 for Afib with RVR. She underwent successful cardioversion in the ED and was placed on Eliquis 5 mg BID. Patient has ILR and since Jan 2024 noted she has 0.7% Afib burden. She questions how much Afib she has had all this time because she was not alerted over the weekend regarding ILR notification. She has been compliant with Eliquis 5 mg BID since ED visit.  Today, she denies symptoms of PND, dizziness, presyncope, syncope, or neurologic sequela. The patient is tolerating medications without difficulties and is otherwise without complaint today.   Past Medical History:  Diagnosis Date   Anemia    Aortic stenosis    mild by echo 09/2020 and 08/2022 with mean AVG   Breast CA (HCC)    left   Breast cancer (HCC)    Carotid stenosis    1-39% left   Degenerative disc disease, lumbar    of the knees   Diabetes mellitus    Dysrhythmia    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HCC)    Encounter for therapeutic drug monitoring 08/17/2018   Fatigue  Severe secondary to to gemfibrozil   Gastroesophageal reflux disease    denies   GI bleeding    a. 06/2018: EGD showed multiple nonbleeding duodenal ulcers. 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   Hammertoe    left second toe   Hypercholesteremia    Hypertension    Insomnia    Irritable bowel    Low back pain    Metabolic syndrome    Morbid obesity (HCC)    OSA on CPAP \   bipap   Osteoarthritis    Osteopenia    Persistent atrial fibrillation [facility])    s/p afib ablation - Dr. Johney Frame 08/2018   Personal history of radiation therapy    Pneumonia    PONV (postoperative nausea and vomiting)    Sigmoid diverticulosis    Thalassemia minor    Unstable angina (HCC)  10/23/2020   Vitamin D deficiency    Past Surgical History:  Procedure Laterality Date   ATRIAL FIBRILLATION ABLATION N/A 09/21/2018   Procedure: ATRIAL FIBRILLATION ABLATION;  Surgeon: Hillis Range, MD;  Location: [facility] INVASIVE CV LAB;  Service: Cardiovascular;  Laterality: N/A;   BIOPSY  07/06/2018   Procedure: BIOPSY;  Surgeon: Vida Rigger, MD;  Location: [facility] ENDOSCOPY;  Service: Endoscopy;;   BREAST LUMPECTOMY Left 05/28/2009   BREAST SURGERY Left 2010   lumpectomy   CARDIOVERSION N/A 07/07/2018   Procedure: CARDIOVERSION;  Surgeon: Chilton Si, MD;  Location: [facility] ENDOSCOPY;  Service: Cardiovascular;  Laterality: N/A;   CARDIOVERSION N/A 07/14/2018   Procedure: CARDIOVERSION;  Surgeon: Laurey Morale, MD;  Location: [facility] ENDOSCOPY;  Service: Cardiovascular;  Laterality: N/A;   CESAREAN SECTION     COLONOSCOPY WITH PROPOFOL N/A 07/06/2018   Procedure: COLONOSCOPY WITH PROPOFOL;  Surgeon: Vida Rigger, MD;  Location: [facility] ENDOSCOPY;  Service: Endoscopy;  Laterality: N/A;   CYSTECTOMY  1970   pilonidal    DILATION AND CURETTAGE OF UTERUS     ESOPHAGOGASTRODUODENOSCOPY (EGD) WITH PROPOFOL N/A 07/06/2018   Procedure: ESOPHAGOGASTRODUODENOSCOPY (EGD) WITH PROPOFOL;  Surgeon: Vida Rigger, MD;  Location: [facility] ENDOSCOPY;  Service: Endoscopy;  Laterality: N/A;   EYE SURGERY Bilateral 07/2013   cataracts   HOT HEMOSTASIS N/A 07/06/2018   Procedure: HOT HEMOSTASIS (ARGON PLASMA COAGULATION/BICAP);  Surgeon: Vida Rigger, MD;  Location: [facility] ENDOSCOPY;  Service: Endoscopy;  Laterality: N/A;   implantable loop recorder placement  01/15/2022   Medtronic Reveal Linq model [id number] implantable loop recorder (SN [id number]) implanted for AF management by Dr Johney Frame   MAXIMUM ACCESS (MAS)POSTERIOR LUMBAR INTERBODY FUSION (PLIF) 2 LEVEL N/A 09/22/2013   Procedure: FOR MAXIMUM ACCESS  POSTERIOR LUMBAR INTERBODY FUSION LUMBAR THREE-FOUR FOUR-FIVE;  Surgeon: Tia Alert, MD;  Location: [facility] NEURO ORS;  Service:  Neurosurgery;  Laterality: N/A;   OOPHORECTOMY     wedge section not rem   REPLACEMENT TOTAL KNEE Left 2011   REPLACEMENT TOTAL KNEE     RIGHT/LEFT HEART CATH AND CORONARY ANGIOGRAPHY N/A 10/26/2020   Procedure: RIGHT/LEFT HEART CATH AND CORONARY ANGIOGRAPHY;  Surgeon: Marykay Lex, MD;  Location: [facility] INVASIVE CV LAB;  Service: Cardiovascular;  Laterality: N/A;   TEE WITHOUT CARDIOVERSION N/A 07/07/2018   Procedure: TRANSESOPHAGEAL ECHOCARDIOGRAM (TEE);  Surgeon: Chilton Si, MD;  Location: [facility] ENDOSCOPY;  Service: Cardiovascular;  Laterality: N/A;   TEE WITHOUT CARDIOVERSION N/A 09/21/2018   Procedure: TRANSESOPHAGEAL ECHOCARDIOGRAM (TEE);  Surgeon: Elease Hashimoto Deloris Ping, MD;  Location: [facility] ENDOSCOPY;  Service: Cardiovascular;  Laterality: N/A;   TONSILLECTOMY  TUBAL LIGATION      Current Outpatient Medications  Medication Sig Dispense Refill   apixaban (ELIQUIS) 5 MG TABS tablet Take 1 tablet (5 mg total) by mouth 2 (two) times daily. 60 tablet 1   bismuth subsalicylate (PEPTO BISMOL) 262 MG chewable tablet Chew 262-524 mg by mouth 3 (three) times daily as needed for indigestion.     Calcium Carbonate-Vitamin D (CALCIUM 600 + D PO) Take 1 tablet by mouth daily.      Cholecalciferol (VITAMIN D3) 25 MCG (1000 UT) CAPS Take 1,000 Units by mouth every morning.     diltiazem (CARDIZEM CD) 120 MG 24 hr capsule TAKE 1 CAPSULE BY MOUTH EVERY DAY (Patient taking differently: Take 120 mg by mouth daily.) 90 capsule 2   diphenhydramine-acetaminophen (TYLENOL PM) 25-500 MG TABS tablet Take 2 tablets by mouth at bedtime as needed (sleep).     metFORMIN (GLUCOPHAGE-XR) 500 MG 24 hr tablet 1 tablet with evening meal Orally Once a day for 30 days     Multiple Vitamin (MULTIVITAMIN) tablet Take 1 tablet by mouth daily with breakfast.     ondansetron (ZOFRAN) 4 MG tablet Take 4 mg by mouth every 8 (eight) hours as needed for nausea or vomiting.     ONETOUCH ULTRA test strip 1 each by Other route  daily.     tirzepatide [facility]) 5 MG/0.5ML Pen Inject 5 mg into the skin once a week. (Patient taking differently: Inject 5 mg into the skin every Sunday.) 2 mL 3   zolpidem (AMBIEN) 5 MG tablet Take 5 mg by mouth at bedtime as needed for sleep.     No current facility-administered medications for this encounter.    Allergies  Allergen Reactions   Metoprolol Shortness Of Breath, Diarrhea and Other (See Comments)    Fatigue    Amiodarone Other (See Comments)    Reaction not recalled   Anastrozole Other (See Comments)    Fatigue   Calcitriol Other (See Comments)    Reaction not noted   Furosemide Other (See Comments)    Cramps   Hydrocodone-Acetaminophen Nausea And Vomiting and Other (See Comments)    Patient reports terrible nausea/GI upset; plain Tylenol is ok with patient   Jardiance [Empagliflozin] Other (See Comments)    Severe muscle and bone pain   Tamoxifen Other (See Comments)    Bone and body aches    Tetracyclines & Related Other (See Comments)    Vaginal infections     Toprol Xl [Metoprolol Tartrate] Other (See Comments)    Hair loss   Verapamil Other (See Comments)    Unknown   Vicodin [Hydrocodone-Acetaminophen] Nausea And Vomiting and Other (See Comments)    Patient reports terrible nausea; plain tylenol is ok with patient   Aromasin [Exemestane] Other (See Comments)    Hair loss    Gemfibrozil Other (See Comments)    Severe fatigue   Other Other (See Comments)    Numerous cancer drugs - hair loss and/or could not tolerate    Social History   Socioeconomic History   Marital status: Widowed    Spouse name: Not on file   Number of children: Not on file   Years of education: Not on file   Highest education level: Not on file  Occupational History   Not on file  Tobacco Use   Smoking status: Former    Packs/day: 0.50    Years: 10.00    Additional pack years: 0.00    Total pack years: 5.00  Types: Cigarettes    Quit date: 11/12/2003    Years  since quitting: 19.2   Smokeless tobacco: Never  Vaping Use   Vaping Use: Never used  Substance and Sexual Activity   Alcohol use: Yes    Comment: occasionally   Drug use: No   Sexual activity: Not Currently    Birth control/protection: Post-menopausal  Other Topics Concern   Not on file  Social History Narrative   Not on file   Social Determinants of Health   Financial Resource Strain: Not on file  Food Insecurity: Not on file  Transportation Needs: Not on file  Physical Activity: Not on file  Stress: Not on file  Social Connections: Not on file  Intimate Partner Violence: Not on file    Family History  Problem Relation Age of Onset   Anesthesia problems Mother    Diabetes Mother    Diabetes Father     ROS- All systems are reviewed and negative except as per the HPI above  Physical Exam: Vitals:   01/27/23 0823  Pulse: 73  Weight: 95.5 kg  Height: 5' 4.5" (1.638 m)    Wt Readings from Last 3 Encounters:  01/27/23 95.5 kg  09/22/22 99.2 kg  09/04/22 103.9 kg    Labs: Lab Results  Component Value Date   NA 142 01/25/2023   K 4.3 01/25/2023   CL 105 01/25/2023   CO2 23 01/25/2023   GLUCOSE 148 (H) 01/25/2023   BUN 27 (H) 01/25/2023   CREATININE 1.47 (H) 01/25/2023   CALCIUM 9.7 01/25/2023   MG 2.0 01/25/2023   Lab Results  Component Value Date   INR 3.1 (A) 12/26/2021   Lab Results  Component Value Date   CHOL 132 09/16/2021   HDL 46 09/16/2021   LDLCALC 52 09/16/2021   TRIG 210 (H) 09/16/2021    GEN- The patient is well appearing, alert and oriented x 3 today.   Head- normocephalic, atraumatic Eyes-  Sclera clear, conjunctiva pink Ears- hearing intact Lungs- Clear to ausculation bilaterally, normal work of breathing Heart- Regular rate and rhythm, no murmurs, rubs or gallops, PMI not laterally displaced Extremities- no clubbing, cyanosis, or edema MS- no significant deformity or atrophy Skin- no rash or lesion Psych- euthymic mood,  full affect Neuro- strength and sensation are intact  EKG-  Vent. rate 73 BPM PR interval 152 ms QRS duration 88 ms QT/QTcB 368/405 ms P-R-T axes 71 70 55 Normal sinus rhythm with sinus arrhythmia Nonspecific T wave abnormality Abnormal ECG When compared with ECG of 25-Jan-2023 20:53, PREVIOUS ECG IS PRESENT   Echo- 10/09/20  1. Left ventricular ejection fraction, by estimation, is 70 to 75%. The  left ventricle has hyperdynamic function. The left ventricle has no  regional wall motion abnormalities. There is mild left ventricular  hypertrophy. Left ventricular diastolic parameters are consistent with Grade II diastolic dysfunction (pseudonormalization).   2. Right ventricular systolic function is normal. The right ventricular  size is normal. There is normal pulmonary artery systolic pressure.   3. Left atrial size was moderately dilated.   4. The mitral valve is normal in structure. Trivial mitral valve  regurgitation. No evidence of mitral stenosis.   5. The aortic valve is grossly normal. Aortic valve regurgitation is not  visualized. Mild aortic valve stenosis.    CHA2DS2-VASc Score =   4 The patient's score is based upon:         ASSESSMENT AND PLAN: 1. Persistent Atrial Fibrillation (ICD10:  I48.19)  The patient's CHA2DS2-VASc score is 4 , indicating a >4 % annual risk of stroke.   S/p ablation 09/21/18 ILR shows 0.7% burden since January 2024. S/p DCCV on 01/25/23.  She is currently in NSR. Patient is very upset about not being notified over the weekend when she was in Afib from ILR. I advised patient I was unsure of her device parameters in terms of what triggers a response/phone call. I advised since she is a known diagnosis of Afib certain parameters regarding notification may be altered. She is very upset about this and questioning the plan put forth by Dr. Johney Frame to monitor her off anticoagulation is reasonable. We discussed conservative observation for now since  she overall has low Afib burden - she is in agreement. Should she have increased burden of Afib, then long term anticoagulation is recommended. She might consider Watchman procedure at that point.  We will plan for 3 month f/u to trend Afib burden out of precaution. Continue Eliquis 5 mg BID.   2. Secondary Hypercoagulable State (ICD10:  D68.69) The patient is at significant risk for stroke/thromboembolism based upon her CHA2DS2-VASc Score of 4.  Continue Eliquis 5 mg BID.   3. HTN Stable, no changes today.  Follow up 3 months.   Lake Bells, PA-C Afib Clinic [facility] [address] [place], [place] [address] [phone]

## 2023-01-27 NOTE — Telephone Encounter (Signed)
Left message for the patient to call the clinic.

## 2023-01-27 NOTE — Telephone Encounter (Signed)
Patient spoke to clinic supervisor, Dennis Bast, RN.

## 2023-01-27 NOTE — Addendum Note (Signed)
Encounter addended by: Shona Simpson, RN on: 01/27/2023 12:20 PM  Actions taken: Order list changed

## 2023-01-27 NOTE — Telephone Encounter (Signed)
Pt left a voicemail stating she had to go to the emergency room over the weekend. She wants to know why we did not call her over the weekend about having A-fib.

## 2023-01-30 ENCOUNTER — Ambulatory Visit: Payer: Medicare PPO | Attending: Family Medicine | Admitting: Physical Therapy

## 2023-01-30 DIAGNOSIS — R262 Difficulty in walking, not elsewhere classified: Secondary | ICD-10-CM | POA: Insufficient documentation

## 2023-01-30 DIAGNOSIS — M5459 Other low back pain: Secondary | ICD-10-CM | POA: Insufficient documentation

## 2023-01-30 DIAGNOSIS — M545 Low back pain, unspecified: Secondary | ICD-10-CM | POA: Diagnosis not present

## 2023-01-30 NOTE — Therapy (Signed)
OUTPATIENT PHYSICAL THERAPY THORACOLUMBAR TREATMENT    Patient Name: Jenna Mcdonald MRN: [id number] DOB:[birth year], 77 y.o., female Today's Date: 01/30/2023  END OF SESSION:  PT End of Session - 01/30/23 1017     Visit Number 13    Date for PT Re-Evaluation 03/13/23    Authorization Type [employer]    PT Start Time 1015    PT Stop Time 1100    PT Time Calculation (min) 45 min             Past Medical History:  Diagnosis Date   Anemia    Aortic stenosis    mild by echo 09/2020 and 08/2022 with mean AVG   Breast CA (HCC)    left   Breast cancer (HCC)    Carotid stenosis    1-39% left   Degenerative disc disease, lumbar    of the knees   Diabetes mellitus    Dysrhythmia    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HCC)    Encounter for therapeutic drug monitoring 08/17/2018   Fatigue    Severe secondary to to gemfibrozil   Gastroesophageal reflux disease    denies   GI bleeding    a. 06/2018: EGD showed multiple nonbleeding duodenal ulcers. 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   Hammertoe    left second toe   Hypercholesteremia    Hypertension    Insomnia    Irritable bowel    Low back pain    Metabolic syndrome    Morbid obesity (HCC)    OSA on CPAP \   bipap   Osteoarthritis    Osteopenia    Persistent atrial fibrillation [facility])    s/p afib ablation - Dr. Johney Frame 08/2018   Personal history of radiation therapy    Pneumonia    PONV (postoperative nausea and vomiting)    Sigmoid diverticulosis    Thalassemia minor    Unstable angina (HCC) 10/23/2020   Vitamin D deficiency    Past Surgical History:  Procedure Laterality Date   ATRIAL FIBRILLATION ABLATION N/A 09/21/2018   Procedure: ATRIAL FIBRILLATION ABLATION;  Surgeon: Hillis Range, MD;  Location: [facility] INVASIVE CV LAB;  Service: Cardiovascular;  Laterality: N/A;   BIOPSY  07/06/2018   Procedure: BIOPSY;  Surgeon: Vida Rigger, MD;  Location: [facility]  ENDOSCOPY;  Service: Endoscopy;;   BREAST LUMPECTOMY Left 05/28/2009   BREAST SURGERY Left 2010   lumpectomy   CARDIOVERSION N/A 07/07/2018   Procedure: CARDIOVERSION;  Surgeon: Chilton Si, MD;  Location: [facility] ENDOSCOPY;  Service: Cardiovascular;  Laterality: N/A;   CARDIOVERSION N/A 07/14/2018   Procedure: CARDIOVERSION;  Surgeon: Laurey Morale, MD;  Location: [facility] ENDOSCOPY;  Service: Cardiovascular;  Laterality: N/A;   CESAREAN SECTION     COLONOSCOPY WITH PROPOFOL N/A 07/06/2018   Procedure: COLONOSCOPY WITH PROPOFOL;  Surgeon: Vida Rigger, MD;  Location: [facility] ENDOSCOPY;  Service: Endoscopy;  Laterality: N/A;   CYSTECTOMY  1970   pilonidal    DILATION AND CURETTAGE OF UTERUS     ESOPHAGOGASTRODUODENOSCOPY (EGD) WITH PROPOFOL N/A 07/06/2018   Procedure: ESOPHAGOGASTRODUODENOSCOPY (EGD) WITH PROPOFOL;  Surgeon: Vida Rigger, MD;  Location: [facility] ENDOSCOPY;  Service: Endoscopy;  Laterality: N/A;   EYE SURGERY Bilateral 07/2013   cataracts   HOT HEMOSTASIS N/A 07/06/2018   Procedure: HOT HEMOSTASIS (ARGON PLASMA COAGULATION/BICAP);  Surgeon: Vida Rigger, MD;  Location: [facility] ENDOSCOPY;  Service: Endoscopy;  Laterality: N/A;   implantable loop recorder placement  01/15/2022   [employer]tronic Reveal Linq  model [id number] implantable loop recorder (SN [id number]) implanted for AF management by Dr Johney Frame   MAXIMUM ACCESS (MAS)POSTERIOR LUMBAR INTERBODY FUSION (PLIF) 2 LEVEL N/A 09/22/2013   Procedure: FOR MAXIMUM ACCESS  POSTERIOR LUMBAR INTERBODY FUSION LUMBAR THREE-FOUR FOUR-FIVE;  Surgeon: Tia Alert, MD;  Location: [facility] NEURO ORS;  Service: Neurosurgery;  Laterality: N/A;   OOPHORECTOMY     wedge section not rem   REPLACEMENT TOTAL KNEE Left 2011   REPLACEMENT TOTAL KNEE     RIGHT/LEFT HEART CATH AND CORONARY ANGIOGRAPHY N/A 10/26/2020   Procedure: RIGHT/LEFT HEART CATH AND CORONARY ANGIOGRAPHY;  Surgeon: Marykay Lex, MD;  Location: [facility] INVASIVE CV LAB;  Service: Cardiovascular;  Laterality: N/A;    TEE WITHOUT CARDIOVERSION N/A 07/07/2018   Procedure: TRANSESOPHAGEAL ECHOCARDIOGRAM (TEE);  Surgeon: Chilton Si, MD;  Location: [facility] ENDOSCOPY;  Service: Cardiovascular;  Laterality: N/A;   TEE WITHOUT CARDIOVERSION N/A 09/21/2018   Procedure: TRANSESOPHAGEAL ECHOCARDIOGRAM (TEE);  Surgeon: Elease Hashimoto Deloris Ping, MD;  Location: [facility] ENDOSCOPY;  Service: Cardiovascular;  Laterality: N/A;   TONSILLECTOMY     TUBAL LIGATION     Patient Active Problem List   Diagnosis Date Noted   Shortness of breath    Unstable angina (HCC) 10/23/2020   Hypercoagulable state due to paroxysmal atrial fibrillation (HCC) 10/15/2020   Encounter for therapeutic drug monitoring 08/17/2018   CKD (chronic kidney disease) stage 3, GFR 30-59 ml/min (HCC) 07/21/2018   Chronic anemia 07/21/2018   Anemia    Heme positive stool    Atrial fibrillation with RVR (HCC) 07/04/2018   Paroxysmal atrial fibrillation (HCC) 07/02/2018   Carotid stenosis    Bruit of left carotid artery 09/22/2017   Aortic stenosis    Heart murmur 09/09/2016   Morbid obesity (HCC) 02/05/2016   Dry eye 05/17/2015   Type 2 diabetes mellitus without complication (HCC) 08/10/2014   Breast cancer, left breast (HCC) 07/22/2014   Obesity, Class III, BMI 40-49.9 (morbid obesity) (HCC) 07/06/2014   Macular pseudohole 05/17/2014   History of surgical procedure 05/17/2014   OSA (obstructive sleep apnea) 11/17/2013   S/P lumbar spinal fusion 09/22/2013   Persistent atrial fibrillation (HCC) 09/11/2011    Class: Acute   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HCC) 09/11/2011    Class: Chronic   Essential hypertension 09/11/2011   Exogenous obesity 09/11/2011   Gastroesophageal reflux disease 09/11/2011   Thalassemia minor 09/11/2011    Class: Chronic   Breast cancer (HCC) 09/11/2011    Class: Chronic   Degenerative joint disease 09/11/2011    Class: Chronic   Irritable bowel disease 09/11/2011    Class: Chronic   Lumbar disc disease 09/11/2011    PCP: Margaretann Loveless,  MD  REFERRING PROVIDER: Baird Lyons, DO  REFERRING DIAG: chronic bag and knee pain  Rationale for Evaluation and Treatment: Rehabilitation  THERAPY DIAG:  Other low back pain  Acute bilateral low back pain without sciatica  Difficulty in walking, not elsewhere classified  ONSET DATE: 10/14/22  SUBJECTIVE:  SUBJECTIVE STATEMENT:  Atrial fib and ablation done. I am okay, may need to get a watchman inserted. Okay just tired PERTINENT HISTORY:  See above  PAIN:  Are you having pain? RT LB " I think I pulled muscle"  PRECAUTIONS: Other: lumbar fusion 2015  WEIGHT BEARING RESTRICTIONS: No  FALLS:  Has patient fallen in last 6 months? No  LIVING ENVIRONMENT: Lives with: lives with their family Lives in: House/apartment Stairs: Yes: Internal: 15 steps; can reach both Has following equipment at home: Single point cane and uses the cane for long distances grocery shopping  OCCUPATION: retired  PLOF: Independent and gardens, does Presenter, broadcasting, cooks and cleans  PATIENT GOALS: strengthen core and have less pain  NEXT MD VISIT: 11/18/22  OBJECTIVE:   DIAGNOSTIC FINDINGS:  No recent x-rays   PATIENT SURVEYS:  ODI = 70% disability  COGNITION: Overall cognitive status: Within functional limits for tasks assessed     SENSATION: WFL  MUSCLE LENGTH: Very tight HS, piriformis and quads  POSTURE: rounded shoulders and forward head  PALPATION: Tender and tight in the low back mms, tightness iin the rhomboids and the upper traps  LUMBAR ROM:   AROM eval 12/09/22  Flexion Decreased 50% 25%  Extension Decreased 100% Decreased 100%  Right lateral flexion Decreased 75% Decreased 75%  Left lateral flexion Decreased 75% Decreased 75%  Right rotation    Left rotation     (Blank rows = not  tested)  LOWER EXTREMITY ROM:     Active  Right eval Left eval  Hip flexion    Hip extension    Hip abduction    Hip adduction    Hip internal rotation    Hip external rotation    Knee flexion 10 5  Knee extension 95 100  Ankle dorsiflexion    Ankle plantarflexion    Ankle inversion    Ankle eversion     (Blank rows = not tested)  LOWER EXTREMITY MMT:    MMT Right eval Left eval  Hip flexion 4- 4-  Hip extension 4- 4-  Hip abduction 4- 4-  Hip adduction    Hip internal rotation    Hip external rotation    Knee flexion 3+ 4-  Knee extension 3+ 4-  Ankle dorsiflexion 4 4  Ankle plantarflexion    Ankle inversion    Ankle eversion     (Blank rows = not tested)  FUNCTIONAL TESTS:  3 minute walk test: 220 feet had to stop at 30 seconds 01/13/23 = 440 feet 6 minute walk test = 600 feet TUG = 16 seconds   GAIT: Distance walked: 220 feet Assistive device utilized: Single point cane Level of assistance: Complete Independence Comments: stopped due to back pain  TODAY'S TREATMENT:                                                                                                                              DATE:  01/30/23 Nustep L 5 10 min Black tband lumbar extension and flexion 2 sets 10 Seated row 25# and lat pull 20# 2 sets 10 Supine core stab Passive stretch LE's/STW RT LB     01/21/23 Nustep level 5 x 10 minutes Black tband lumbar extension Leg curls 20# 3x10 Green tband clamshells Supine green tband hip extension Bridges Passive stretch LE's STM to the right buttock and lumbar area  01/15/23 Nustep L 6 10 min PROM LE and trunk, STW to RT LB and buttuck with and without theragun- very tender Ppt, ppt with marching and clams- green tband Feet on ball small bridge,K2C, rotation, posterior isometrics, abdominal isometrics Rows 25# 3x10 Lats 20# 3x10 Black tband lumbar extension Black tband trunk flexion DN RT LB by Octavio Graves  PT  01/13/23 Gait one short lap around the back building one rest due to fatigue and some cramping in the thighs coming up the hill Nustep level 5 x 10 minutes Feet on ball K2C, rotation, posterior isometrics, abdominal isometrics Passive stretch LE's very tight and sore today  01/07/23 Bike Level 4 x 3 minutes had to stop due to knee pain Leg curls 25# 3x10 Rows 25# 3x10 Lats 20# 3x10 Black tband lumbar extension Black tband trunk flexion 3# standing march and hip abduction 3# LAQ Nustep level 5 x 8 mintues Feet on ball K2C, rotation, bridge and isometric abs  01/01/23 Nustep level 5 x 8 minutes 5# straight arm pulls on the airex cues for core and posture Leg press 20# 2x10 Leg curls 20# 2x10 Tried extension but this caused some knee pain 3# LAQ 3# hip extension 3# hip abduction Gait outside around the parking [place] some pain coming up slope 25# rows 25# lats Black tband lumbar extension Bike x 3 minutes difficulty with knee ROM  12/30/22 Nustep level 4 x 9 minutes 5# straight arm pulls with cues for core and posture 20# 2x10 triceps 5# biceps 2x10 Feet on ball K2C, rotation, bridge, iso abs Passive stretch of the HS and piriformis Pateint was frustrated about the lack of weight loss, we talked about her choices and the weight that she has lost is sustainable and what she can do to keep up the momentum that she has  12/23/22 Amb outside 6 min, slower than normal d/t pain Sitting on dynadisc pelvic ROM 15 x 4 way Red tband scap stab on dynadisc 4 ways 15 x each Feet on ball bridge, KTC, obl, iso abdominals PROM LE and trunk- gentle  12/16/22 Nustep L 5 UBE L4 6 min Black tband trunk extension and flexion 20 x Seated row 20# 2x15 Lats 20# 2x15 Farmers carry 7# 2 laps each hand STS with wt ball press 12 x Wt ball OH ext 10 x Wt ball trunk rotation 10 x each  PATIENT EDUCATION:  Education details: POC Person educated: Patient Education method:  Explanation Education comprehension: verbalized understanding  HOME EXERCISE PROGRAM: TBD  ASSESSMENT:  CLINICAL IMPRESSION: pt had to go to ER last weekend with Afib, ended up having ablation done and has had MD follow up with possible watchmen insertion upcoming. Pt states okay but fatigue.- slowly resumed ex and stretching. No changes with goals with slight set back but did fairly well today OBJECTIVE IMPAIRMENTS: Abnormal gait, cardiopulmonary status limiting activity, decreased activity tolerance, decreased balance, decreased coordination, decreased endurance, decreased mobility, difficulty walking, decreased ROM, decreased strength, increased fascial restrictions, impaired perceived functional ability, increased muscle spasms, impaired flexibility, improper body mechanics, and pain.  REHAB POTENTIAL: Good  CLINICAL DECISION MAKING: Stable/uncomplicated  EVALUATION COMPLEXITY: Low   GOALS: Goals reviewed with patient? Yes  SHORT TERM GOALS: Target date: 11/24/22  Independent with initial HEP Goal status: 11/27/22 MET  LONG TERM GOALS: Target date: 02/11/23  Independent iwht posture and body mechanics Goal status:met 01/01/23  2.  Walk 500 feet in a 3 minute walk test Goal status:ongoing 01/21/23  3.  Decrease pain 25% Goal status: progressing 01/21/23  4.  Be able to cook a meal without pain > 5/10 Goal status: progressing 01/21/23  5.  Independent with HEP or gym Goal status: progressing 01/07/23  PLAN:  PT FREQUENCY: 1-2x/week  PT DURATION: 12 weeks  PLANNED INTERVENTIONS: Therapeutic exercises, Therapeutic activity, Neuromuscular re-education, Balance training, Gait training, Patient/Family education, Self Care, Joint mobilization, Dry Needling, Electrical stimulation, Cryotherapy, Moist heat, and Manual therapy.  PLAN FOR NEXT SESSION: assess and progress Helana Macbride,ANGIE, PTA 01/30/2023, 10:18 AM [facility] [address] [place], [place], [address] Phone: [phone]   Fax:  [phone] [address] [place], [place], [address] Phone: [phone]   Fax:  [phone]  Patient Details  Name: Jenna Mcdonald MRN: [id number] Date of Birth: 1945-11-01 Referring Provider:  Helane Rima, DO  Encounter Date: 01/30/2023   Suanne Marker, PTA 01/30/2023, 10:18 AM  [facility] [address] [place], [place], [address] Phone: [phone]   Fax:  [phone]

## 2023-02-03 ENCOUNTER — Ambulatory Visit: Payer: Medicare PPO | Admitting: Physical Therapy

## 2023-02-03 DIAGNOSIS — J069 Acute upper respiratory infection, unspecified: Secondary | ICD-10-CM | POA: Diagnosis not present

## 2023-02-05 ENCOUNTER — Ambulatory Visit: Payer: Medicare PPO | Admitting: Physical Therapy

## 2023-02-05 DIAGNOSIS — M1711 Unilateral primary osteoarthritis, right knee: Secondary | ICD-10-CM | POA: Diagnosis not present

## 2023-02-09 ENCOUNTER — Encounter: Payer: Self-pay | Admitting: Physical Therapy

## 2023-02-09 ENCOUNTER — Ambulatory Visit: Payer: Medicare PPO | Admitting: Physical Therapy

## 2023-02-09 DIAGNOSIS — R262 Difficulty in walking, not elsewhere classified: Secondary | ICD-10-CM

## 2023-02-09 DIAGNOSIS — M545 Low back pain, unspecified: Secondary | ICD-10-CM

## 2023-02-09 DIAGNOSIS — M5459 Other low back pain: Secondary | ICD-10-CM

## 2023-02-09 NOTE — Therapy (Signed)
OUTPATIENT PHYSICAL THERAPY THORACOLUMBAR TREATMENT    Patient Name: Jenna Mcdonald MRN: [id number] DOB:1945-12-10, 77 y.o., female Today's Date: 02/09/2023  END OF SESSION:  PT End of Session - 02/09/23 1448     Visit Number 14    Number of Visits 24    Date for PT Re-Evaluation 04/14/23    Authorization Type [employer]    PT Start Time 1442    PT Stop Time 1528    PT Time Calculation (min) 46 min    Activity Tolerance Patient tolerated treatment well    Behavior During Therapy [facility] for tasks assessed/performed             Past Medical History:  Diagnosis Date   Anemia    Aortic stenosis    mild by echo 09/2020 and 08/2022 with mean AVG   Breast CA (HCC)    left   Breast cancer (HCC)    Carotid stenosis    1-39% left   Degenerative disc disease, lumbar    of the knees   Diabetes mellitus    Dysrhythmia    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HCC)    Encounter for therapeutic drug monitoring 08/17/2018   Fatigue    Severe secondary to to gemfibrozil   Gastroesophageal reflux disease    denies   GI bleeding    a. 06/2018: EGD showed multiple nonbleeding duodenal ulcers. 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   Hammertoe    left second toe   Hypercholesteremia    Hypertension    Insomnia    Irritable bowel    Low back pain    Metabolic syndrome    Morbid obesity (HCC)    OSA on CPAP \   bipap   Osteoarthritis    Osteopenia    Persistent atrial fibrillation [facility])    s/p afib ablation - Dr. Johney Frame 08/2018   Personal history of radiation therapy    Pneumonia    PONV (postoperative nausea and vomiting)    Sigmoid diverticulosis    Thalassemia minor    Unstable angina (HCC) 10/23/2020   Vitamin D deficiency    Past Surgical History:  Procedure Laterality Date   ATRIAL FIBRILLATION ABLATION N/A 09/21/2018   Procedure: ATRIAL FIBRILLATION ABLATION;  Surgeon: Hillis Range, MD;  Location: [facility]  INVASIVE CV LAB;  Service: Cardiovascular;  Laterality: N/A;   BIOPSY  07/06/2018   Procedure: BIOPSY;  Surgeon: Vida Rigger, MD;  Location: [facility] ENDOSCOPY;  Service: Endoscopy;;   BREAST LUMPECTOMY Left 05/28/2009   BREAST SURGERY Left 2010   lumpectomy   CARDIOVERSION N/A 07/07/2018   Procedure: CARDIOVERSION;  Surgeon: Chilton Si, MD;  Location: [facility] ENDOSCOPY;  Service: Cardiovascular;  Laterality: N/A;   CARDIOVERSION N/A 07/14/2018   Procedure: CARDIOVERSION;  Surgeon: Laurey Morale, MD;  Location: [facility] ENDOSCOPY;  Service: Cardiovascular;  Laterality: N/A;   CESAREAN SECTION     COLONOSCOPY WITH PROPOFOL N/A 07/06/2018   Procedure: COLONOSCOPY WITH PROPOFOL;  Surgeon: Vida Rigger, MD;  Location: [facility] ENDOSCOPY;  Service: Endoscopy;  Laterality: N/A;   CYSTECTOMY  1970   pilonidal    DILATION AND CURETTAGE OF UTERUS     ESOPHAGOGASTRODUODENOSCOPY (EGD) WITH PROPOFOL N/A 07/06/2018   Procedure: ESOPHAGOGASTRODUODENOSCOPY (EGD) WITH PROPOFOL;  Surgeon: Vida Rigger, MD;  Location: [facility] ENDOSCOPY;  Service: Endoscopy;  Laterality: N/A;   EYE SURGERY Bilateral 07/2013   cataracts   HOT HEMOSTASIS N/A 07/06/2018   Procedure: HOT HEMOSTASIS (ARGON PLASMA COAGULATION/BICAP);  Surgeon:  Vida Rigger, MD;  Location: [facility] ENDOSCOPY;  Service: Endoscopy;  Laterality: N/A;   implantable loop recorder placement  01/15/2022   Medtronic Reveal Linq model [id number] implantable loop recorder (SN [id number]) implanted for AF management by Dr Johney Frame   MAXIMUM ACCESS (MAS)POSTERIOR LUMBAR INTERBODY FUSION (PLIF) 2 LEVEL N/A 09/22/2013   Procedure: FOR MAXIMUM ACCESS  POSTERIOR LUMBAR INTERBODY FUSION LUMBAR THREE-FOUR FOUR-FIVE;  Surgeon: Tia Alert, MD;  Location: [facility] NEURO ORS;  Service: Neurosurgery;  Laterality: N/A;   OOPHORECTOMY     wedge section not rem   REPLACEMENT TOTAL KNEE Left 2011   REPLACEMENT TOTAL KNEE     RIGHT/LEFT HEART CATH AND CORONARY ANGIOGRAPHY N/A 10/26/2020   Procedure:  RIGHT/LEFT HEART CATH AND CORONARY ANGIOGRAPHY;  Surgeon: Marykay Lex, MD;  Location: [facility] INVASIVE CV LAB;  Service: Cardiovascular;  Laterality: N/A;   TEE WITHOUT CARDIOVERSION N/A 07/07/2018   Procedure: TRANSESOPHAGEAL ECHOCARDIOGRAM (TEE);  Surgeon: Chilton Si, MD;  Location: [facility] ENDOSCOPY;  Service: Cardiovascular;  Laterality: N/A;   TEE WITHOUT CARDIOVERSION N/A 09/21/2018   Procedure: TRANSESOPHAGEAL ECHOCARDIOGRAM (TEE);  Surgeon: Elease Hashimoto Deloris Ping, MD;  Location: [facility] ENDOSCOPY;  Service: Cardiovascular;  Laterality: N/A;   TONSILLECTOMY     TUBAL LIGATION     Patient Active Problem List   Diagnosis Date Noted   Shortness of breath    Unstable angina (HCC) 10/23/2020   Hypercoagulable state due to paroxysmal atrial fibrillation (HCC) 10/15/2020   Encounter for therapeutic drug monitoring 08/17/2018   CKD (chronic kidney disease) stage 3, GFR 30-59 ml/min (HCC) 07/21/2018   Chronic anemia 07/21/2018   Anemia    Heme positive stool    Atrial fibrillation with RVR (HCC) 07/04/2018   Paroxysmal atrial fibrillation (HCC) 07/02/2018   Carotid stenosis    Bruit of left carotid artery 09/22/2017   Aortic stenosis    Heart murmur 09/09/2016   Morbid obesity (HCC) 02/05/2016   Dry eye 05/17/2015   Type 2 diabetes mellitus without complication (HCC) 08/10/2014   Breast cancer, left breast (HCC) 07/22/2014   Obesity, Class III, BMI 40-49.9 (morbid obesity) (HCC) 07/06/2014   Macular pseudohole 05/17/2014   History of surgical procedure 05/17/2014   OSA (obstructive sleep apnea) 11/17/2013   S/P lumbar spinal fusion 09/22/2013   Persistent atrial fibrillation (HCC) 09/11/2011    Class: Acute   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HCC) 09/11/2011    Class: Chronic   Essential hypertension 09/11/2011   Exogenous obesity 09/11/2011   Gastroesophageal reflux disease 09/11/2011   Thalassemia minor 09/11/2011    Class: Chronic   Breast cancer (HCC) 09/11/2011    Class: Chronic    Degenerative joint disease 09/11/2011    Class: Chronic   Irritable bowel disease 09/11/2011    Class: Chronic   Lumbar disc disease 09/11/2011    PCP: Margaretann Loveless, MD  REFERRING PROVIDER: Baird Lyons, DO  REFERRING DIAG: chronic bag and knee pain  Rationale for Evaluation and Treatment: Rehabilitation  THERAPY DIAG:  Other low back pain  Acute bilateral low back pain without sciatica  Difficulty in walking, not elsewhere classified  ONSET DATE: 10/14/22  SUBJECTIVE:  SUBJECTIVE STATEMENT:  Patient has had to cancel due to having a cold, and not feeling well.  REports some increase of LBP  PERTINENT HISTORY:  See above  PAIN:  Are you having pain? RT LB " I think I pulled muscle"  PRECAUTIONS: Other: lumbar fusion 2015  WEIGHT BEARING RESTRICTIONS: No  FALLS:  Has patient fallen in last 6 months? No  LIVING ENVIRONMENT: Lives with: lives with their family Lives in: House/apartment Stairs: Yes: Internal: 15 steps; can reach both Has following equipment at home: Single point cane and uses the cane for long distances grocery shopping  OCCUPATION: retired  PLOF: Independent and gardens, does Presenter, broadcasting, cooks and cleans  PATIENT GOALS: strengthen core and have less pain  NEXT MD VISIT: 11/18/22  OBJECTIVE:   DIAGNOSTIC FINDINGS:  No recent x-rays   PATIENT SURVEYS:  ODI = 70% disability  COGNITION: Overall cognitive status: Within functional limits for tasks assessed     SENSATION: WFL  MUSCLE LENGTH: Very tight HS, piriformis and quads  POSTURE: rounded shoulders and forward head  PALPATION: Tender and tight in the low back mms, tightness iin the rhomboids and the upper traps  LUMBAR ROM:   AROM eval 12/09/22  Flexion Decreased 50% 25%  Extension Decreased 100%  Decreased 100%  Right lateral flexion Decreased 75% Decreased 75%  Left lateral flexion Decreased 75% Decreased 75%  Right rotation    Left rotation     (Blank rows = not tested)  LOWER EXTREMITY ROM:     Active  Right eval Left eval  Hip flexion    Hip extension    Hip abduction    Hip adduction    Hip internal rotation    Hip external rotation    Knee flexion 10 5  Knee extension 95 100  Ankle dorsiflexion    Ankle plantarflexion    Ankle inversion    Ankle eversion     (Blank rows = not tested)  LOWER EXTREMITY MMT:    MMT Right eval Left eval  Hip flexion 4- 4-  Hip extension 4- 4-  Hip abduction 4- 4-  Hip adduction    Hip internal rotation    Hip external rotation    Knee flexion 3+ 4-  Knee extension 3+ 4-  Ankle dorsiflexion 4 4  Ankle plantarflexion    Ankle inversion    Ankle eversion     (Blank rows = not tested)  FUNCTIONAL TESTS:  3 minute walk test: 220 feet had to stop at 30 seconds 01/13/23 = 440 feet 6 minute walk test = 600 feet TUG = 16 seconds   GAIT: Distance walked: 220 feet Assistive device utilized: Single point cane Level of assistance: Complete Independence Comments: stopped due to back pain  TODAY'S TREATMENT:  DATE:   02/09/23 Nustep level 5 x 10 minutes 20# leg cursl, 25# leg curls 10 each 20# Rows 20# lats Black tband lumbar extension Feet on ball K2C, rotation, small bridge, isometric abs Passive stretch HS and piriformis STM to the right buttock  01/30/23 Nustep L 5 10 min Black tband lumbar extension and flexion 2 sets 10 Seated row 25# and lat pull 20# 2 sets 10 Supine core stab Passive stretch LE's/STW RT LB  01/21/23 Nustep level 5 x 10 minutes Black tband lumbar extension Leg curls 20# 3x10 Green tband clamshells Supine green tband hip extension Bridges Passive stretch  LE's STM to the right buttock and lumbar area  01/15/23 Nustep L 6 10 min PROM LE and trunk, STW to RT LB and buttuck with and without theragun- very tender Ppt, ppt with marching and clams- green tband Feet on ball small bridge,K2C, rotation, posterior isometrics, abdominal isometrics Rows 25# 3x10 Lats 20# 3x10 Black tband lumbar extension Black tband trunk flexion DN RT LB by Octavio Graves PT  01/13/23 Gait one short lap around the back building one rest due to fatigue and some cramping in the thighs coming up the hill Nustep level 5 x 10 minutes Feet on ball K2C, rotation, posterior isometrics, abdominal isometrics Passive stretch LE's very tight and sore today  01/07/23 Bike Level 4 x 3 minutes had to stop due to knee pain Leg curls 25# 3x10 Rows 25# 3x10 Lats 20# 3x10 Black tband lumbar extension Black tband trunk flexion 3# standing march and hip abduction 3# LAQ Nustep level 5 x 8 mintues Feet on ball K2C, rotation, bridge and isometric abs  01/01/23 Nustep level 5 x 8 minutes 5# straight arm pulls on the airex cues for core and posture Leg press 20# 2x10 Leg curls 20# 2x10 Tried extension but this caused some knee pain 3# LAQ 3# hip extension 3# hip abduction Gait outside around the parking [place] some pain coming up slope 25# rows 25# lats Black tband lumbar extension Bike x 3 minutes difficulty with knee ROM  12/30/22 Nustep level 4 x 9 minutes 5# straight arm pulls with cues for core and posture 20# 2x10 triceps 5# biceps 2x10 Feet on ball K2C, rotation, bridge, iso abs Passive stretch of the HS and piriformis Pateint was frustrated about the lack of weight loss, we talked about her choices and the weight that she has lost is sustainable and what she can do to keep up the momentum that she has PATIENT EDUCATION:  Education details: POC Person educated: Patient Education method: Explanation Education comprehension: verbalized understanding  HOME EXERCISE  PROGRAM: TBD  ASSESSMENT:  CLINICAL IMPRESSION: Patient had to miss a few appointments due to illness, reports that she has a cold now.  She was able to tolerate the exercises but had to decrease in weight for some, she was very tight and very tender in the right buttock mms OBJECTIVE IMPAIRMENTS: Abnormal gait, cardiopulmonary status limiting activity, decreased activity tolerance, decreased balance, decreased coordination, decreased endurance, decreased mobility, difficulty walking, decreased ROM, decreased strength, increased fascial restrictions, impaired perceived functional ability, increased muscle spasms, impaired flexibility, improper body mechanics, and pain.   REHAB POTENTIAL: Good  CLINICAL DECISION MAKING: Stable/uncomplicated  EVALUATION COMPLEXITY: Low   GOALS: Goals reviewed with patient? Yes  SHORT TERM GOALS: Target date: 11/24/22  Independent with initial HEP Goal status: 11/27/22 MET  LONG TERM GOALS: Target date: 02/11/23  Independent iwht posture and body mechanics Goal status:met 01/01/23  2.  Walk 500 feet in a 3 minute walk test Goal status:ongoing 02/09/23  3.  Decrease pain 25% Goal status: progressing 02/09/23  4.  Be able to cook a meal without pain > 5/10 Goal status: progressing 02/09/23  5.  Independent with HEP or gym Goal status: progressing 01/07/23  PLAN:  PT FREQUENCY: 1-2x/week  PT DURATION: 12 weeks  PLANNED INTERVENTIONS: Therapeutic exercises, Therapeutic activity, Neuromuscular re-education, Balance training, Gait training, Patient/Family education, Self Care, Joint mobilization, Dry Needling, Electrical stimulation, Cryotherapy, Moist heat, and Manual therapy.  PLAN FOR NEXT SESSION: she will be out of town a week, resume when she returns Jearld Lesch, PT 02/09/2023, 2:48 PM [facility] [address] [facility] [place], [place], [address]

## 2023-02-12 ENCOUNTER — Ambulatory Visit: Payer: Medicare PPO | Admitting: Physical Therapy

## 2023-02-12 DIAGNOSIS — M1711 Unilateral primary osteoarthritis, right knee: Secondary | ICD-10-CM | POA: Diagnosis not present

## 2023-02-16 NOTE — Progress Notes (Signed)
[facility] Summary Report / Loop Recorder 

## 2023-02-17 LAB — AMB RESULTS CONSOLE CBG: Glucose: 181

## 2023-02-18 DIAGNOSIS — I480 Paroxysmal atrial fibrillation: Secondary | ICD-10-CM | POA: Diagnosis not present

## 2023-02-18 DIAGNOSIS — N1831 Chronic kidney disease, stage 3a: Secondary | ICD-10-CM | POA: Diagnosis not present

## 2023-02-18 DIAGNOSIS — M1711 Unilateral primary osteoarthritis, right knee: Secondary | ICD-10-CM | POA: Diagnosis not present

## 2023-02-18 DIAGNOSIS — E1169 Type 2 diabetes mellitus with other specified complication: Secondary | ICD-10-CM | POA: Diagnosis not present

## 2023-02-18 DIAGNOSIS — Z6835 Body mass index (BMI) 35.0-35.9, adult: Secondary | ICD-10-CM | POA: Diagnosis not present

## 2023-02-18 LAB — CUP PACEART REMOTE DEVICE CHECK
Date Time Interrogation Session: 20240625230657
Implantable Pulse Generator Implant Date: 20230524

## 2023-02-23 ENCOUNTER — Ambulatory Visit (INDEPENDENT_AMBULATORY_CARE_PROVIDER_SITE_OTHER): Payer: Medicare PPO

## 2023-02-23 DIAGNOSIS — I480 Paroxysmal atrial fibrillation: Secondary | ICD-10-CM | POA: Diagnosis not present

## 2023-02-24 ENCOUNTER — Ambulatory Visit: Payer: Medicare PPO | Attending: Family Medicine | Admitting: Physical Therapy

## 2023-02-24 DIAGNOSIS — M62830 Muscle spasm of back: Secondary | ICD-10-CM | POA: Insufficient documentation

## 2023-02-24 DIAGNOSIS — M5459 Other low back pain: Secondary | ICD-10-CM | POA: Diagnosis not present

## 2023-02-24 DIAGNOSIS — R262 Difficulty in walking, not elsewhere classified: Secondary | ICD-10-CM | POA: Insufficient documentation

## 2023-02-24 DIAGNOSIS — M25561 Pain in right knee: Secondary | ICD-10-CM | POA: Insufficient documentation

## 2023-02-24 DIAGNOSIS — M545 Low back pain, unspecified: Secondary | ICD-10-CM | POA: Diagnosis not present

## 2023-02-24 NOTE — Therapy (Signed)
OUTPATIENT PHYSICAL THERAPY THORACOLUMBAR TREATMENT    Patient Name: Jenna Mcdonald MRN: [id number] DOB:1945-12-13, 77 y.o., female Today's Date: 02/24/2023  END OF SESSION:  PT End of Session - 02/24/23 0856     Visit Number 15    Date for PT Re-Evaluation 04/14/23    Authorization Type [employer]    PT Start Time 0845    PT Stop Time 0930    PT Time Calculation (min) 45 min             Past Medical History:  Diagnosis Date   Anemia    Aortic stenosis    mild by echo 09/2020 and 08/2022 with mean AVG   Breast CA (HCC)    left   Breast cancer (HCC)    Carotid stenosis    1-39% left   Degenerative disc disease, lumbar    of the knees   Diabetes mellitus    Dysrhythmia    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HCC)    Encounter for therapeutic drug monitoring 08/17/2018   Fatigue    Severe secondary to to gemfibrozil   Gastroesophageal reflux disease    denies   GI bleeding    a. 06/2018: EGD showed multiple nonbleeding duodenal ulcers. 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   Hammertoe    left second toe   Hypercholesteremia    Hypertension    Insomnia    Irritable bowel    Low back pain    Metabolic syndrome    Morbid obesity (HCC)    OSA on CPAP \   bipap   Osteoarthritis    Osteopenia    Persistent atrial fibrillation [facility])    s/p afib ablation - Dr. Johney Frame 08/2018   Personal history of radiation therapy    Pneumonia    PONV (postoperative nausea and vomiting)    Sigmoid diverticulosis    Thalassemia minor    Unstable angina (HCC) 10/23/2020   Vitamin D deficiency    Past Surgical History:  Procedure Laterality Date   ATRIAL FIBRILLATION ABLATION N/A 09/21/2018   Procedure: ATRIAL FIBRILLATION ABLATION;  Surgeon: Hillis Range, MD;  Location: [facility] INVASIVE CV LAB;  Service: Cardiovascular;  Laterality: N/A;   BIOPSY  07/06/2018   Procedure: BIOPSY;  Surgeon: Vida Rigger, MD;  Location: [facility]  ENDOSCOPY;  Service: Endoscopy;;   BREAST LUMPECTOMY Left 05/28/2009   BREAST SURGERY Left 2010   lumpectomy   CARDIOVERSION N/A 07/07/2018   Procedure: CARDIOVERSION;  Surgeon: Chilton Si, MD;  Location: [facility] ENDOSCOPY;  Service: Cardiovascular;  Laterality: N/A;   CARDIOVERSION N/A 07/14/2018   Procedure: CARDIOVERSION;  Surgeon: Laurey Morale, MD;  Location: [facility] ENDOSCOPY;  Service: Cardiovascular;  Laterality: N/A;   CESAREAN SECTION     COLONOSCOPY WITH PROPOFOL N/A 07/06/2018   Procedure: COLONOSCOPY WITH PROPOFOL;  Surgeon: Vida Rigger, MD;  Location: [facility] ENDOSCOPY;  Service: Endoscopy;  Laterality: N/A;   CYSTECTOMY  1970   pilonidal    DILATION AND CURETTAGE OF UTERUS     ESOPHAGOGASTRODUODENOSCOPY (EGD) WITH PROPOFOL N/A 07/06/2018   Procedure: ESOPHAGOGASTRODUODENOSCOPY (EGD) WITH PROPOFOL;  Surgeon: Vida Rigger, MD;  Location: [facility] ENDOSCOPY;  Service: Endoscopy;  Laterality: N/A;   EYE SURGERY Bilateral 07/2013   cataracts   HOT HEMOSTASIS N/A 07/06/2018   Procedure: HOT HEMOSTASIS (ARGON PLASMA COAGULATION/BICAP);  Surgeon: Vida Rigger, MD;  Location: [facility] ENDOSCOPY;  Service: Endoscopy;  Laterality: N/A;   implantable loop recorder placement  01/15/2022   Medtronic Reveal Linq  model [id number] implantable loop recorder (SN [id number]) implanted for AF management by Dr Johney Frame   MAXIMUM ACCESS (MAS)POSTERIOR LUMBAR INTERBODY FUSION (PLIF) 2 LEVEL N/A 09/22/2013   Procedure: FOR MAXIMUM ACCESS  POSTERIOR LUMBAR INTERBODY FUSION LUMBAR THREE-FOUR FOUR-FIVE;  Surgeon: Tia Alert, MD;  Location: [facility] NEURO ORS;  Service: Neurosurgery;  Laterality: N/A;   OOPHORECTOMY     wedge section not rem   REPLACEMENT TOTAL KNEE Left 2011   REPLACEMENT TOTAL KNEE     RIGHT/LEFT HEART CATH AND CORONARY ANGIOGRAPHY N/A 10/26/2020   Procedure: RIGHT/LEFT HEART CATH AND CORONARY ANGIOGRAPHY;  Surgeon: Marykay Lex, MD;  Location: [facility] INVASIVE CV LAB;  Service: Cardiovascular;  Laterality: N/A;    TEE WITHOUT CARDIOVERSION N/A 07/07/2018   Procedure: TRANSESOPHAGEAL ECHOCARDIOGRAM (TEE);  Surgeon: Chilton Si, MD;  Location: [facility] ENDOSCOPY;  Service: Cardiovascular;  Laterality: N/A;   TEE WITHOUT CARDIOVERSION N/A 09/21/2018   Procedure: TRANSESOPHAGEAL ECHOCARDIOGRAM (TEE);  Surgeon: Elease Hashimoto Deloris Ping, MD;  Location: [facility] ENDOSCOPY;  Service: Cardiovascular;  Laterality: N/A;   TONSILLECTOMY     TUBAL LIGATION     Patient Active Problem List   Diagnosis Date Noted   Shortness of breath    Unstable angina (HCC) 10/23/2020   Hypercoagulable state due to paroxysmal atrial fibrillation (HCC) 10/15/2020   Encounter for therapeutic drug monitoring 08/17/2018   CKD (chronic kidney disease) stage 3, GFR 30-59 ml/min (HCC) 07/21/2018   Chronic anemia 07/21/2018   Anemia    Heme positive stool    Atrial fibrillation with RVR (HCC) 07/04/2018   Paroxysmal atrial fibrillation (HCC) 07/02/2018   Carotid stenosis    Bruit of left carotid artery 09/22/2017   Aortic stenosis    Heart murmur 09/09/2016   Morbid obesity (HCC) 02/05/2016   Dry eye 05/17/2015   Type 2 diabetes mellitus without complication (HCC) 08/10/2014   Breast cancer, left breast (HCC) 07/22/2014   Obesity, Class III, BMI 40-49.9 (morbid obesity) (HCC) 07/06/2014   Macular pseudohole 05/17/2014   History of surgical procedure 05/17/2014   OSA (obstructive sleep apnea) 11/17/2013   S/P lumbar spinal fusion 09/22/2013   Persistent atrial fibrillation (HCC) 09/11/2011    Class: Acute   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HCC) 09/11/2011    Class: Chronic   Essential hypertension 09/11/2011   Exogenous obesity 09/11/2011   Gastroesophageal reflux disease 09/11/2011   Thalassemia minor 09/11/2011    Class: Chronic   Breast cancer (HCC) 09/11/2011    Class: Chronic   Degenerative joint disease 09/11/2011    Class: Chronic   Irritable bowel disease 09/11/2011    Class: Chronic   Lumbar disc disease 09/11/2011    PCP: Margaretann Loveless,  MD  REFERRING PROVIDER: Baird Lyons, DO  REFERRING DIAG: chronic bag and knee pain  Rationale for Evaluation and Treatment: Rehabilitation  THERAPY DIAG:  Other low back pain  Acute bilateral low back pain without sciatica  Acute pain of right knee  ONSET DATE: 10/14/22  SUBJECTIVE:  SUBJECTIVE STATEMENT:  Feeling better. RT knee inj but not much relief PERTINENT HISTORY:  See above  PAIN:  Are you having pain? RT LB " I think I pulled muscle"  PRECAUTIONS: Other: lumbar fusion 2015  WEIGHT BEARING RESTRICTIONS: No  FALLS:  Has patient fallen in last 6 months? No  LIVING ENVIRONMENT: Lives with: lives with their family Lives in: House/apartment Stairs: Yes: Internal: 15 steps; can reach both Has following equipment at home: Single point cane and uses the cane for long distances grocery shopping  OCCUPATION: retired  PLOF: Independent and gardens, does Presenter, broadcasting, cooks and cleans  PATIENT GOALS: strengthen core and have less pain  NEXT MD VISIT: 11/18/22  OBJECTIVE:   DIAGNOSTIC FINDINGS:  No recent x-rays   PATIENT SURVEYS:  ODI = 70% disability  COGNITION: Overall cognitive status: Within functional limits for tasks assessed     SENSATION: WFL  MUSCLE LENGTH: Very tight HS, piriformis and quads  POSTURE: rounded shoulders and forward head  PALPATION: Tender and tight in the low back mms, tightness iin the rhomboids and the upper traps  LUMBAR ROM:   AROM eval 12/09/22  Flexion Decreased 50% 25%  Extension Decreased 100% Decreased 100%  Right lateral flexion Decreased 75% Decreased 75%  Left lateral flexion Decreased 75% Decreased 75%  Right rotation    Left rotation     (Blank rows = not tested)  LOWER EXTREMITY ROM:     Active  Right eval Left eval  Hip  flexion    Hip extension    Hip abduction    Hip adduction    Hip internal rotation    Hip external rotation    Knee flexion 10 5  Knee extension 95 100  Ankle dorsiflexion    Ankle plantarflexion    Ankle inversion    Ankle eversion     (Blank rows = not tested)  LOWER EXTREMITY MMT:    MMT Right eval Left eval  Hip flexion 4- 4-  Hip extension 4- 4-  Hip abduction 4- 4-  Hip adduction    Hip internal rotation    Hip external rotation    Knee flexion 3+ 4-  Knee extension 3+ 4-  Ankle dorsiflexion 4 4  Ankle plantarflexion    Ankle inversion    Ankle eversion     (Blank rows = not tested)  FUNCTIONAL TESTS:  3 minute walk test: 220 feet had to stop at 30 seconds 01/13/23 = 440 feet 6 minute walk test = 600 feet TUG = 16 seconds   GAIT: Distance walked: 220 feet Assistive device utilized: Single point cane Level of assistance: Complete Independence Comments: stopped due to back pain  TODAY'S TREATMENT:                                                                                                                              DATE:    02/24/23 Nustep L 5 10 min Black  tband trunk flex and ext 20x 20# Rows 2 sets 10 20# lats 2 sets 10 STS wt ball press 10 x LAQ 2 sets 10 HS curls 25# 2 sets 10 Standing bicep curl into ext 10x Standing upright row into ext 10x Dyna disc pelvic ROM      02/09/23 Nustep level 5 x 10 minutes 20# leg cursl, 25# leg curls 10 each 20# Rows 20# lats Black tband lumbar extension Feet on ball K2C, rotation, small bridge, isometric abs Passive stretch HS and piriformis STM to the right buttock  01/30/23 Nustep L 5 10 min Black tband lumbar extension and flexion 2 sets 10 Seated row 25# and lat pull 20# 2 sets 10 Supine core stab Passive stretch LE's/STW RT LB  01/21/23 Nustep level 5 x 10 minutes Black tband lumbar extension Leg curls 20# 3x10 Green tband clamshells Supine green tband hip  extension Bridges Passive stretch LE's STM to the right buttock and lumbar area  01/15/23 Nustep L 6 10 min PROM LE and trunk, STW to RT LB and buttuck with and without theragun- very tender Ppt, ppt with marching and clams- green tband Feet on ball small bridge,K2C, rotation, posterior isometrics, abdominal isometrics Rows 25# 3x10 Lats 20# 3x10 Black tband lumbar extension Black tband trunk flexion DN RT LB by Octavio Graves PT  01/13/23 Gait one short lap around the back building one rest due to fatigue and some cramping in the thighs coming up the hill Nustep level 5 x 10 minutes Feet on ball K2C, rotation, posterior isometrics, abdominal isometrics Passive stretch LE's very tight and sore today  01/07/23 Bike Level 4 x 3 minutes had to stop due to knee pain Leg curls 25# 3x10 Rows 25# 3x10 Lats 20# 3x10 Black tband lumbar extension Black tband trunk flexion 3# standing march and hip abduction 3# LAQ Nustep level 5 x 8 mintues Feet on ball K2C, rotation, bridge and isometric abs  01/01/23 Nustep level 5 x 8 minutes 5# straight arm pulls on the airex cues for core and posture Leg press 20# 2x10 Leg curls 20# 2x10 Tried extension but this caused some knee pain 3# LAQ 3# hip extension 3# hip abduction Gait outside around the parking [place] some pain coming up slope 25# rows 25# lats Black tband lumbar extension Bike x 3 minutes difficulty with knee ROM  12/30/22 Nustep level 4 x 9 minutes 5# straight arm pulls with cues for core and posture 20# 2x10 triceps 5# biceps 2x10 Feet on ball K2C, rotation, bridge, iso abs Passive stretch of the HS and piriformis Pateint was frustrated about the lack of weight loss, we talked about her choices and the weight that she has lost is sustainable and what she can do to keep up the momentum that she has PATIENT EDUCATION:  Education details: POC Person educated: Patient Education method: Explanation Education comprehension:  verbalized understanding  HOME EXERCISE PROGRAM: TBD  ASSESSMENT:  CLINICAL IMPRESSION: pt arrives after 2 weeks off feeling better, RT knee still an issue. Pt states struggling to walk in this heat.Continue to work on progression of strength and stamina. Goals assessed dn documented  OBJECTIVE IMPAIRMENTS: Abnormal gait, cardiopulmonary status limiting activity, decreased activity tolerance, decreased balance, decreased coordination, decreased endurance, decreased mobility, difficulty walking, decreased ROM, decreased strength, increased fascial restrictions, impaired perceived functional ability, increased muscle spasms, impaired flexibility, improper body mechanics, and pain.   REHAB POTENTIAL: Good  CLINICAL DECISION MAKING: Stable/uncomplicated  EVALUATION COMPLEXITY: Low   GOALS: Goals reviewed  with patient? Yes  SHORT TERM GOALS: Target date: 11/24/22  Independent with initial HEP Goal status: 11/27/22 MET  LONG TERM GOALS: Target date: 02/11/23  Independent iwht posture and body mechanics Goal status:met 01/01/23  2.  Walk 500 feet in a 3 minute walk test Goal status:ongoing 02/09/23   02/24/23 progressing  3.  Decrease pain 25% Goal status: progressing 02/09/23  02/24/23 MET 70% for back  4.  Be able to cook a meal without pain > 5/10 Goal status: progressing 02/09/23  5.  Independent with HEP or gym Goal status: progressing 01/07/23  progressing 02/24/23  PLAN:  PT FREQUENCY: 1-2x/week  PT DURATION: 12 weeks  PLANNED INTERVENTIONS: Therapeutic exercises, Therapeutic activity, Neuromuscular re-education, Balance training, Gait training, Patient/Family education, Self Care, Joint mobilization, Dry Needling, Electrical stimulation, Cryotherapy, Moist heat, and Manual therapy.  PLAN FOR NEXT SESSION: continue to progress    Teana Lindahl,ANGIE, PTA 02/24/2023, 8:57 AM [facility] at [facility] [address] [facility] [place],  [place], [id number] at [facility] [address] [facility]. [place], [place], [address] Phone: [phone]   Fax:  [phone]  Patient Details  Name: Jenna Mcdonald MRN: [id number] Date of Birth: 1945-11-07 Referring Provider:  Helane Rima, DO  Encounter Date: 02/24/2023   Suanne Marker, PTA 02/24/2023, 8:57 AM  [facility] [address] [place], [place], [address] Phone: [phone]   Fax:  [phone]

## 2023-02-28 ENCOUNTER — Other Ambulatory Visit (HOSPITAL_BASED_OUTPATIENT_CLINIC_OR_DEPARTMENT_OTHER): Payer: Self-pay

## 2023-03-05 ENCOUNTER — Ambulatory Visit: Payer: Medicare PPO | Admitting: Physical Therapy

## 2023-03-05 ENCOUNTER — Encounter: Payer: Self-pay | Admitting: Physical Therapy

## 2023-03-05 DIAGNOSIS — M25561 Pain in right knee: Secondary | ICD-10-CM | POA: Diagnosis not present

## 2023-03-05 DIAGNOSIS — M545 Low back pain, unspecified: Secondary | ICD-10-CM | POA: Diagnosis not present

## 2023-03-05 DIAGNOSIS — M62830 Muscle spasm of back: Secondary | ICD-10-CM | POA: Diagnosis not present

## 2023-03-05 DIAGNOSIS — M5459 Other low back pain: Secondary | ICD-10-CM

## 2023-03-05 DIAGNOSIS — R262 Difficulty in walking, not elsewhere classified: Secondary | ICD-10-CM

## 2023-03-05 NOTE — Therapy (Signed)
OUTPATIENT PHYSICAL THERAPY THORACOLUMBAR TREATMENT    Patient Name: Shantara Goosby MRN: [id number] DOB:05/30/1946, 77 y.o., female Today's Date: 03/05/2023  END OF SESSION:  PT End of Session - 03/05/23 0929     Visit Number 16    Number of Visits 24    Date for PT Re-Evaluation 04/14/23    Authorization Type [employer]    PT Start Time 0920    PT Stop Time 1010    PT Time Calculation (min) 50 min    Activity Tolerance Patient tolerated treatment well    Behavior During Therapy [facility] for tasks assessed/performed             Past Medical History:  Diagnosis Date   Anemia    Aortic stenosis    mild by echo 09/2020 and 08/2022 with mean AVG   Breast CA (HCC)    left   Breast cancer (HCC)    Carotid stenosis    1-39% left   Degenerative disc disease, lumbar    of the knees   Diabetes mellitus    Dysrhythmia    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HCC)    Encounter for therapeutic drug monitoring 08/17/2018   Fatigue    Severe secondary to to gemfibrozil   Gastroesophageal reflux disease    denies   GI bleeding    a. 06/2018: EGD showed multiple nonbleeding duodenal ulcers. 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   Hammertoe    left second toe   Hypercholesteremia    Hypertension    Insomnia    Irritable bowel    Low back pain    Metabolic syndrome    Morbid obesity (HCC)    OSA on CPAP \   bipap   Osteoarthritis    Osteopenia    Persistent atrial fibrillation [facility])    s/p afib ablation - Dr. Johney Frame 08/2018   Personal history of radiation therapy    Pneumonia    PONV (postoperative nausea and vomiting)    Sigmoid diverticulosis    Thalassemia minor    Unstable angina (HCC) 10/23/2020   Vitamin D deficiency    Past Surgical History:  Procedure Laterality Date   ATRIAL FIBRILLATION ABLATION N/A 09/21/2018   Procedure: ATRIAL FIBRILLATION ABLATION;  Surgeon: Hillis Range, MD;  Location: [facility]  INVASIVE CV LAB;  Service: Cardiovascular;  Laterality: N/A;   BIOPSY  07/06/2018   Procedure: BIOPSY;  Surgeon: Vida Rigger, MD;  Location: [facility] ENDOSCOPY;  Service: Endoscopy;;   BREAST LUMPECTOMY Left 05/28/2009   BREAST SURGERY Left 2010   lumpectomy   CARDIOVERSION N/A 07/07/2018   Procedure: CARDIOVERSION;  Surgeon: Chilton Si, MD;  Location: [facility] ENDOSCOPY;  Service: Cardiovascular;  Laterality: N/A;   CARDIOVERSION N/A 07/14/2018   Procedure: CARDIOVERSION;  Surgeon: Laurey Morale, MD;  Location: [facility] ENDOSCOPY;  Service: Cardiovascular;  Laterality: N/A;   CESAREAN SECTION     COLONOSCOPY WITH PROPOFOL N/A 07/06/2018   Procedure: COLONOSCOPY WITH PROPOFOL;  Surgeon: Vida Rigger, MD;  Location: [facility] ENDOSCOPY;  Service: Endoscopy;  Laterality: N/A;   CYSTECTOMY  1970   pilonidal    DILATION AND CURETTAGE OF UTERUS     ESOPHAGOGASTRODUODENOSCOPY (EGD) WITH PROPOFOL N/A 07/06/2018   Procedure: ESOPHAGOGASTRODUODENOSCOPY (EGD) WITH PROPOFOL;  Surgeon: Vida Rigger, MD;  Location: [facility] ENDOSCOPY;  Service: Endoscopy;  Laterality: N/A;   EYE SURGERY Bilateral 07/2013   cataracts   HOT HEMOSTASIS N/A 07/06/2018   Procedure: HOT HEMOSTASIS (ARGON PLASMA COAGULATION/BICAP);  Surgeon:  Vida Rigger, MD;  Location: [facility] ENDOSCOPY;  Service: Endoscopy;  Laterality: N/A;   implantable loop recorder placement  01/15/2022   [employer] Reveal Linq model [id number] implantable loop recorder (SN [id number]) implanted for AF management by Dr Johney Frame   MAXIMUM ACCESS (MAS)POSTERIOR LUMBAR INTERBODY FUSION (PLIF) 2 LEVEL N/A 09/22/2013   Procedure: FOR MAXIMUM ACCESS  POSTERIOR LUMBAR INTERBODY FUSION LUMBAR THREE-FOUR FOUR-FIVE;  Surgeon: Tia Alert, MD;  Location: [facility] NEURO ORS;  Service: Neurosurgery;  Laterality: N/A;   OOPHORECTOMY     wedge section not rem   REPLACEMENT TOTAL KNEE Left 2011   REPLACEMENT TOTAL KNEE     RIGHT/LEFT HEART CATH AND CORONARY ANGIOGRAPHY N/A 10/26/2020   Procedure:  RIGHT/LEFT HEART CATH AND CORONARY ANGIOGRAPHY;  Surgeon: Marykay Lex, MD;  Location: [facility] INVASIVE CV LAB;  Service: Cardiovascular;  Laterality: N/A;   TEE WITHOUT CARDIOVERSION N/A 07/07/2018   Procedure: TRANSESOPHAGEAL ECHOCARDIOGRAM (TEE);  Surgeon: Chilton Si, MD;  Location: [facility] ENDOSCOPY;  Service: Cardiovascular;  Laterality: N/A;   TEE WITHOUT CARDIOVERSION N/A 09/21/2018   Procedure: TRANSESOPHAGEAL ECHOCARDIOGRAM (TEE);  Surgeon: Elease Hashimoto Deloris Ping, MD;  Location: [facility] ENDOSCOPY;  Service: Cardiovascular;  Laterality: N/A;   TONSILLECTOMY     TUBAL LIGATION     Patient Active Problem List   Diagnosis Date Noted   Shortness of breath    Unstable angina (HCC) 10/23/2020   Hypercoagulable state due to paroxysmal atrial fibrillation (HCC) 10/15/2020   Encounter for therapeutic drug monitoring 08/17/2018   CKD (chronic kidney disease) stage 3, GFR 30-59 ml/min (HCC) 07/21/2018   Chronic anemia 07/21/2018   Anemia    Heme positive stool    Atrial fibrillation with RVR (HCC) 07/04/2018   Paroxysmal atrial fibrillation (HCC) 07/02/2018   Carotid stenosis    Bruit of left carotid artery 09/22/2017   Aortic stenosis    Heart murmur 09/09/2016   Morbid obesity (HCC) 02/05/2016   Dry eye 05/17/2015   Type 2 diabetes mellitus without complication (HCC) 08/10/2014   Breast cancer, left breast (HCC) 07/22/2014   Obesity, Class III, BMI 40-49.9 (morbid obesity) (HCC) 07/06/2014   Macular pseudohole 05/17/2014   History of surgical procedure 05/17/2014   OSA (obstructive sleep apnea) 11/17/2013   S/P lumbar spinal fusion 09/22/2013   Persistent atrial fibrillation (HCC) 09/11/2011    Class: Acute   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HCC) 09/11/2011    Class: Chronic   Essential hypertension 09/11/2011   Exogenous obesity 09/11/2011   Gastroesophageal reflux disease 09/11/2011   Thalassemia minor 09/11/2011    Class: Chronic   Breast cancer (HCC) 09/11/2011    Class: Chronic    Degenerative joint disease 09/11/2011    Class: Chronic   Irritable bowel disease 09/11/2011    Class: Chronic   Lumbar disc disease 09/11/2011    PCP: Margaretann Loveless, MD  REFERRING PROVIDER: Baird Lyons, DO  REFERRING DIAG: chronic bag and knee pain  Rationale for Evaluation and Treatment: Rehabilitation  THERAPY DIAG:  Other low back pain  Acute pain of right knee  Difficulty in walking, not elsewhere classified  Muscle spasm of back  ONSET DATE: 10/14/22  SUBJECTIVE:  SUBJECTIVE STATEMENT:  I am doing pretty good, less pain, reports that she has not been able to do much due to the heat PERTINENT HISTORY:  See above  PAIN:  Are you having pain? RT LB " I think I pulled muscle"  PRECAUTIONS: Other: lumbar fusion 2015  WEIGHT BEARING RESTRICTIONS: No  FALLS:  Has patient fallen in last 6 months? No  LIVING ENVIRONMENT: Lives with: lives with their family Lives in: House/apartment Stairs: Yes: Internal: 15 steps; can reach both Has following equipment at home: Single point cane and uses the cane for long distances grocery shopping  OCCUPATION: retired  PLOF: Independent and gardens, does Presenter, broadcasting, cooks and cleans  PATIENT GOALS: strengthen core and have less pain  NEXT MD VISIT: 11/18/22  OBJECTIVE:   DIAGNOSTIC FINDINGS:  No recent x-rays   PATIENT SURVEYS:  ODI = 70% disability  COGNITION: Overall cognitive status: Within functional limits for tasks assessed     SENSATION: WFL  MUSCLE LENGTH: Very tight HS, piriformis and quads  POSTURE: rounded shoulders and forward head  PALPATION: Tender and tight in the low back mms, tightness iin the rhomboids and the upper traps  LUMBAR ROM:   AROM eval 12/09/22  Flexion Decreased 50% 25%  Extension Decreased 100% Decreased  100%  Right lateral flexion Decreased 75% Decreased 75%  Left lateral flexion Decreased 75% Decreased 75%  Right rotation    Left rotation     (Blank rows = not tested)  LOWER EXTREMITY ROM:     Active  Right eval Left eval  Hip flexion    Hip extension    Hip abduction    Hip adduction    Hip internal rotation    Hip external rotation    Knee flexion 10 5  Knee extension 95 100  Ankle dorsiflexion    Ankle plantarflexion    Ankle inversion    Ankle eversion     (Blank rows = not tested)  LOWER EXTREMITY MMT:    MMT Right eval Left eval  Hip flexion 4- 4-  Hip extension 4- 4-  Hip abduction 4- 4-  Hip adduction    Hip internal rotation    Hip external rotation    Knee flexion 3+ 4-  Knee extension 3+ 4-  Ankle dorsiflexion 4 4  Ankle plantarflexion    Ankle inversion    Ankle eversion     (Blank rows = not tested)  FUNCTIONAL TESTS:  3 minute walk test: 220 feet had to stop at 30 seconds 01/13/23 = 440 feet 6 minute walk test = 600 feet TUG = 16 seconds   GAIT: Distance walked: 220 feet Assistive device utilized: Single point cane Level of assistance: Complete Independence Comments: stopped due to back pain  TODAY'S TREATMENT:  DATE:   03/05/23 Nustep level 5 x 10 minutes 10# straight arm pulls with cues for posture and core activation 25# leg curls 2x15 5# Leg extension 2x10 25# rows 20# lats Black tband triceps 5# biceps Worked with her and problem solved getting up and down from the floor, we practiced this and worked on a few things to hlep her as she is going to try to put a small pool on her deck and she will need to get up and down  02/24/23 Nustep L 5 10 min Black tband trunk flex and ext 20x 20# Rows 2 sets 10 20# lats 2 sets 10 STS wt ball press 10 x LAQ 2 sets 10 HS curls 25# 2 sets 10 Standing bicep  curl into ext 10x Standing upright row into ext 10x Dyna disc pelvic ROM  02/09/23 Nustep level 5 x 10 minutes 20# leg cursl, 25# leg curls 10 each 20# Rows 20# lats Black tband lumbar extension Feet on ball K2C, rotation, small bridge, isometric abs Passive stretch HS and piriformis STM to the right buttock  01/30/23 Nustep L 5 10 min Black tband lumbar extension and flexion 2 sets 10 Seated row 25# and lat pull 20# 2 sets 10 Supine core stab Passive stretch LE's/STW RT LB PATIENT EDUCATION:  Education details: POC Person educated: Patient Education method: Explanation Education comprehension: verbalized understanding  HOME EXERCISE PROGRAM: TBD  ASSESSMENT:  CLINICAL IMPRESSION: Patient had questions regarding getting up from floor so we worked on that, also worked on our continued overall strength and function.  She reports that she is feeling better but she has not been able to do much due to the heat, we discussed gym and possibly joining that or a pool to stay active  OBJECTIVE IMPAIRMENTS: Abnormal gait, cardiopulmonary status limiting activity, decreased activity tolerance, decreased balance, decreased coordination, decreased endurance, decreased mobility, difficulty walking, decreased ROM, decreased strength, increased fascial restrictions, impaired perceived functional ability, increased muscle spasms, impaired flexibility, improper body mechanics, and pain.   REHAB POTENTIAL: Good  CLINICAL DECISION MAKING: Stable/uncomplicated  EVALUATION COMPLEXITY: Low   GOALS: Goals reviewed with patient? Yes  SHORT TERM GOALS: Target date: 11/24/22  Independent with initial HEP Goal status: 11/27/22 MET  LONG TERM GOALS: Target date: 02/11/23  Independent iwht posture and body mechanics Goal status:met 01/01/23  2.  Walk 500 feet in a 3 minute walk test Goal status:ongoing 02/09/23   02/24/23 progressing  3.  Decrease pain 25% Goal status: met 03/05/23  4.  Be able to  cook a meal without pain > 5/10 Goal status: progressing 02/09/23  5.  Independent with HEP or gym Goal status: progressing 01/07/23  progressing 02/24/23  PLAN:  PT FREQUENCY: 1-2x/week  PT DURATION: 12 weeks  PLANNED INTERVENTIONS: Therapeutic exercises, Therapeutic activity, Neuromuscular re-education, Balance training, Gait training, Patient/Family education, Self Care, Joint mobilization, Dry Needling, Electrical stimulation, Cryotherapy, Moist heat, and Manual therapy.  PLAN FOR NEXT SESSION: continue to progress   Stacie Glaze, PT

## 2023-03-06 DIAGNOSIS — G4733 Obstructive sleep apnea (adult) (pediatric): Secondary | ICD-10-CM | POA: Diagnosis not present

## 2023-03-12 ENCOUNTER — Ambulatory Visit: Payer: Medicare PPO | Admitting: Physical Therapy

## 2023-03-12 DIAGNOSIS — M25561 Pain in right knee: Secondary | ICD-10-CM

## 2023-03-12 DIAGNOSIS — M545 Low back pain, unspecified: Secondary | ICD-10-CM | POA: Diagnosis not present

## 2023-03-12 DIAGNOSIS — M5459 Other low back pain: Secondary | ICD-10-CM

## 2023-03-12 DIAGNOSIS — M62830 Muscle spasm of back: Secondary | ICD-10-CM | POA: Diagnosis not present

## 2023-03-12 DIAGNOSIS — R262 Difficulty in walking, not elsewhere classified: Secondary | ICD-10-CM | POA: Diagnosis not present

## 2023-03-12 NOTE — Therapy (Signed)
OUTPATIENT PHYSICAL THERAPY THORACOLUMBAR TREATMENT    Patient Name: Jenna Mcdonald MRN: [id number] DOB:1946/05/12, 77 y.o., female Today's Date: 03/12/2023  END OF SESSION:  PT End of Session - 03/12/23 0927     Visit Number 17    Date for PT Re-Evaluation 04/14/23    PT Start Time 0925    PT Stop Time 1010    PT Time Calculation (min) 45 min             Past Medical History:  Diagnosis Date   Anemia    Aortic stenosis    mild by echo 09/2020 and 08/2022 with mean AVG   Breast CA (HCC)    left   Breast cancer (HCC)    Carotid stenosis    1-39% left   Degenerative disc disease, lumbar    of the knees   Diabetes mellitus    Dysrhythmia    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HCC)    Encounter for therapeutic drug monitoring 08/17/2018   Fatigue    Severe secondary to to gemfibrozil   Gastroesophageal reflux disease    denies   GI bleeding    a. 06/2018: EGD showed multiple nonbleeding duodenal ulcers. 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   Hammertoe    left second toe   Hypercholesteremia    Hypertension    Insomnia    Irritable bowel    Low back pain    Metabolic syndrome    Morbid obesity (HCC)    OSA on CPAP \   bipap   Osteoarthritis    Osteopenia    Persistent atrial fibrillation [facility])    s/p afib ablation - Dr. Johney Frame 08/2018   Personal history of radiation therapy    Pneumonia    PONV (postoperative nausea and vomiting)    Sigmoid diverticulosis    Thalassemia minor    Unstable angina (HCC) 10/23/2020   Vitamin D deficiency    Past Surgical History:  Procedure Laterality Date   ATRIAL FIBRILLATION ABLATION N/A 09/21/2018   Procedure: ATRIAL FIBRILLATION ABLATION;  Surgeon: Hillis Range, MD;  Location: [facility] INVASIVE CV LAB;  Service: Cardiovascular;  Laterality: N/A;   BIOPSY  07/06/2018   Procedure: BIOPSY;  Surgeon: Vida Rigger, MD;  Location: [facility] ENDOSCOPY;  Service:  Endoscopy;;   BREAST LUMPECTOMY Left 05/28/2009   BREAST SURGERY Left 2010   lumpectomy   CARDIOVERSION N/A 07/07/2018   Procedure: CARDIOVERSION;  Surgeon: Chilton Si, MD;  Location: [facility] ENDOSCOPY;  Service: Cardiovascular;  Laterality: N/A;   CARDIOVERSION N/A 07/14/2018   Procedure: CARDIOVERSION;  Surgeon: Laurey Morale, MD;  Location: [facility] ENDOSCOPY;  Service: Cardiovascular;  Laterality: N/A;   CESAREAN SECTION     COLONOSCOPY WITH PROPOFOL N/A 07/06/2018   Procedure: COLONOSCOPY WITH PROPOFOL;  Surgeon: Vida Rigger, MD;  Location: [facility] ENDOSCOPY;  Service: Endoscopy;  Laterality: N/A;   CYSTECTOMY  1970   pilonidal    DILATION AND CURETTAGE OF UTERUS     ESOPHAGOGASTRODUODENOSCOPY (EGD) WITH PROPOFOL N/A 07/06/2018   Procedure: ESOPHAGOGASTRODUODENOSCOPY (EGD) WITH PROPOFOL;  Surgeon: Vida Rigger, MD;  Location: [facility] ENDOSCOPY;  Service: Endoscopy;  Laterality: N/A;   EYE SURGERY Bilateral 07/2013   cataracts   HOT HEMOSTASIS N/A 07/06/2018   Procedure: HOT HEMOSTASIS (ARGON PLASMA COAGULATION/BICAP);  Surgeon: Vida Rigger, MD;  Location: [facility] ENDOSCOPY;  Service: Endoscopy;  Laterality: N/A;   implantable loop recorder placement  01/15/2022   [employer]tr[employer] Reveal Linq model [id number] implantable loop recorder (SN  [id number]) implanted for AF management by Dr Johney Frame   MAXIMUM ACCESS (MAS)POSTERIOR LUMBAR INTERBODY FUSION (PLIF) 2 LEVEL N/A 09/22/2013   Procedure: FOR MAXIMUM ACCESS  POSTERIOR LUMBAR INTERBODY FUSION LUMBAR THREE-FOUR FOUR-FIVE;  Surgeon: Tia Alert, MD;  Location: [facility] NEURO ORS;  Service: Neurosurgery;  Laterality: N/A;   OOPHORECTOMY     wedge section not rem   REPLACEMENT TOTAL KNEE Left 2011   REPLACEMENT TOTAL KNEE     RIGHT/LEFT HEART CATH AND CORONARY ANGIOGRAPHY N/A 10/26/2020   Procedure: RIGHT/LEFT HEART CATH AND CORONARY ANGIOGRAPHY;  Surgeon: Marykay Lex, MD;  Location: [facility] INVASIVE CV LAB;  Service: Cardiovascular;  Laterality: N/A;   TEE WITHOUT  CARDIOVERSION N/A 07/07/2018   Procedure: TRANSESOPHAGEAL ECHOCARDIOGRAM (TEE);  Surgeon: Chilton Si, MD;  Location: [facility] ENDOSCOPY;  Service: Cardiovascular;  Laterality: N/A;   TEE WITHOUT CARDIOVERSION N/A 09/21/2018   Procedure: TRANSESOPHAGEAL ECHOCARDIOGRAM (TEE);  Surgeon: Elease Hashimoto Deloris Ping, MD;  Location: [facility] ENDOSCOPY;  Service: Cardiovascular;  Laterality: N/A;   TONSILLECTOMY     TUBAL LIGATION     Patient Active Problem List   Diagnosis Date Noted   Shortness of breath    Unstable angina (HCC) 10/23/2020   Hypercoagulable state due to paroxysmal atrial fibrillation (HCC) 10/15/2020   Encounter for therapeutic drug monitoring 08/17/2018   CKD (chronic kidney disease) stage 3, GFR 30-59 ml/min (HCC) 07/21/2018   Chronic anemia 07/21/2018   Anemia    Heme positive stool    Atrial fibrillation with RVR (HCC) 07/04/2018   Paroxysmal atrial fibrillation (HCC) 07/02/2018   Carotid stenosis    Bruit of left carotid artery 09/22/2017   Aortic stenosis    Heart murmur 09/09/2016   Morbid obesity (HCC) 02/05/2016   Dry eye 05/17/2015   Type 2 diabetes mellitus without complication (HCC) 08/10/2014   Breast cancer, left breast (HCC) 07/22/2014   Obesity, Class III, BMI 40-49.9 (morbid obesity) (HCC) 07/06/2014   Macular pseudohole 05/17/2014   History of surgical procedure 05/17/2014   OSA (obstructive sleep apnea) 11/17/2013   S/P lumbar spinal fusion 09/22/2013   Persistent atrial fibrillation (HCC) 09/11/2011    Class: Acute   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HCC) 09/11/2011    Class: Chronic   Essential hypertension 09/11/2011   Exogenous obesity 09/11/2011   Gastroesophageal reflux disease 09/11/2011   Thalassemia minor 09/11/2011    Class: Chronic   Breast cancer (HCC) 09/11/2011    Class: Chronic   Degenerative joint disease 09/11/2011    Class: Chronic   Irritable bowel disease 09/11/2011    Class: Chronic   Lumbar disc disease 09/11/2011    PCP: Margaretann Loveless,  MD  REFERRING PROVIDER: Baird Lyons, DO  REFERRING DIAG: chronic bag and knee pain  Rationale for Evaluation and Treatment: Rehabilitation  THERAPY DIAG:  Other low back pain  Acute pain of right knee  ONSET DATE: 10/14/22  SUBJECTIVE:  SUBJECTIVE STATEMENT: doing really well. Bought a garden Agricultural engineer and that is helping PERTINENT HISTORY:  See above  PAIN:  Are you having pain? RT LB " I think I pulled muscle"  PRECAUTIONS: Other: lumbar fusion 2015  WEIGHT BEARING RESTRICTIONS: No  FALLS:  Has patient fallen in last 6 months? No  LIVING ENVIRONMENT: Lives with: lives with their family Lives in: House/apartment Stairs: Yes: Internal: 15 steps; can reach both Has following equipment at home: Single point cane and uses the cane for long distances grocery shopping  OCCUPATION: retired  PLOF: Independent and gardens, does Presenter, broadcasting, cooks and cleans  PATIENT GOALS: strengthen core and have less pain  NEXT MD VISIT: 11/18/22  OBJECTIVE:   DIAGNOSTIC FINDINGS:  No recent x-rays   PATIENT SURVEYS:  ODI = 70% disability  COGNITION: Overall cognitive status: Within functional limits for tasks assessed     SENSATION: WFL  MUSCLE LENGTH: Very tight HS, piriformis and quads  POSTURE: rounded shoulders and forward head  PALPATION: Tender and tight in the low back mms, tightness iin the rhomboids and the upper traps  LUMBAR ROM:   AROM eval 12/09/22  Flexion Decreased 50% 25%  Extension Decreased 100% Decreased 100%  Right lateral flexion Decreased 75% Decreased 75%  Left lateral flexion Decreased 75% Decreased 75%  Right rotation    Left rotation     (Blank rows = not tested)  LOWER EXTREMITY ROM:     Active  Right eval Left eval  Hip flexion    Hip extension    Hip  abduction    Hip adduction    Hip internal rotation    Hip external rotation    Knee flexion 10 5  Knee extension 95 100  Ankle dorsiflexion    Ankle plantarflexion    Ankle inversion    Ankle eversion     (Blank rows = not tested)  LOWER EXTREMITY MMT:    MMT Right eval Left eval  Hip flexion 4- 4-  Hip extension 4- 4-  Hip abduction 4- 4-  Hip adduction    Hip internal rotation    Hip external rotation    Knee flexion 3+ 4-  Knee extension 3+ 4-  Ankle dorsiflexion 4 4  Ankle plantarflexion    Ankle inversion    Ankle eversion     (Blank rows = not tested)  FUNCTIONAL TESTS:  3 minute walk test: 220 feet had to stop at 30 seconds 01/13/23 = 440 feet 6 minute walk test = 600 feet TUG = 16 seconds   GAIT: Distance walked: 220 feet Assistive device utilized: Single point cane Level of assistance: Complete Independence Comments: stopped due to back pain  TODAY'S TREATMENT:                                                                                                                              DATE:    03/12/23 Nustep L 6 10 min  25# leg curls 2x15 5# Leg extension 2x10 25# rows 2 x 12 20# lats  2 x 12 Black tband trunk flex and ext Black tband trunk rotation 15 x each Tricep, Bicep and upright row    03/05/23 Nustep level 5 x 10 minutes 10# straight arm pulls with cues for posture and core activation 25# leg curls 2x15 5# Leg extension 2x10 25# rows 20# lats Black tband triceps 5# biceps Worked with her and problem solved getting up and down from the floor, we practiced this and worked on a few things to hlep her as she is going to try to put a small pool on her deck and she will need to get up and down  02/24/23 Nustep L 5 10 min Black tband trunk flex and ext 20x 20# Rows 2 sets 10 20# lats 2 sets 10 STS wt ball press 10 x LAQ 2 sets 10 HS curls 25# 2 sets 10 Standing bicep curl into ext 10x Standing upright row into ext 10x Dyna  disc pelvic ROM  02/09/23 Nustep level 5 x 10 minutes 20# leg cursl, 25# leg curls 10 each 20# Rows 20# lats Black tband lumbar extension Feet on ball K2C, rotation, small bridge, isometric abs Passive stretch HS and piriformis STM to the right buttock  01/30/23 Nustep L 5 10 min Black tband lumbar extension and flexion 2 sets 10 Seated row 25# and lat pull 20# 2 sets 10 Supine core stab Passive stretch LE's/STW RT LB PATIENT EDUCATION:  Education details: POC Person educated: Patient Education method: Explanation Education comprehension: verbalized understanding  HOME EXERCISE PROGRAM: TBD  ASSESSMENT:  CLINICAL IMPRESSION: pt states overall she is better. Bought a garden Agricultural engineer and that helps but when knee hurts its just hard. Overall back is better. Doing more and more. Assessed LTGS and all met except final HEP  OBJECTIVE IMPAIRMENTS: Abnormal gait, cardiopulmonary status limiting activity, decreased activity tolerance, decreased balance, decreased coordination, decreased endurance, decreased mobility, difficulty walking, decreased ROM, decreased strength, increased fascial restrictions, impaired perceived functional ability, increased muscle spasms, impaired flexibility, improper body mechanics, and pain.   REHAB POTENTIAL: Good  CLINICAL DECISION MAKING: Stable/uncomplicated  EVALUATION COMPLEXITY: Low   GOALS: Goals reviewed with patient? Yes  SHORT TERM GOALS: Target date: 11/24/22  Independent with initial HEP Goal status: 11/27/22 MET  LONG TERM GOALS: Target date: 02/11/23  Independent iwht posture and body mechanics Goal status:met 01/01/23    2.  Walk 500 feet in a 3 minute walk test Goal status:ongoing 02/09/23   02/24/23 progressing  03/12/23 MET  3.  Decrease pain 25% Goal status: met 03/05/23  4.  Be able to cook a meal without pain > 5/10 Goal status: progressing 02/09/23  MET 03/12/23  5.  Independent with HEP or gym Goal status: progressing 01/07/23   progressing 02/24/23  Progressing 03/12/23  PLAN:  PT FREQUENCY: 1-2x/week  PT DURATION: 12 weeks  PLANNED INTERVENTIONS: Therapeutic exercises, Therapeutic activity, Neuromuscular re-education, Balance training, Gait training, Patient/Family education, Self Care, Joint mobilization, Dry Needling, Electrical stimulation, Cryotherapy, Moist heat, and Manual therapy.  PLAN FOR NEXT SESSION: continue to progress and work towards D/C. OOT next week in [place]     Florence-Graham, PTA 03/12/2023, 9:28 AM  [facility] at [facility] [address] [facility]. [place], [place], [address] Phone: [phone]   Fax:  [phone]

## 2023-03-16 ENCOUNTER — Other Ambulatory Visit: Payer: Self-pay | Admitting: Internal Medicine

## 2023-03-16 DIAGNOSIS — K862 Cyst of pancreas: Secondary | ICD-10-CM

## 2023-03-16 NOTE — Progress Notes (Signed)
[facility] Summary Report / Loop Recorder 

## 2023-03-24 ENCOUNTER — Ambulatory Visit: Payer: Medicare PPO | Admitting: Physical Therapy

## 2023-03-26 ENCOUNTER — Ambulatory Visit: Payer: Medicare PPO | Admitting: Physical Therapy

## 2023-03-27 ENCOUNTER — Other Ambulatory Visit (HOSPITAL_BASED_OUTPATIENT_CLINIC_OR_DEPARTMENT_OTHER): Payer: Self-pay

## 2023-03-30 ENCOUNTER — Ambulatory Visit: Payer: Medicare PPO

## 2023-03-30 DIAGNOSIS — I480 Paroxysmal atrial fibrillation: Secondary | ICD-10-CM | POA: Diagnosis not present

## 2023-04-03 ENCOUNTER — Ambulatory Visit: Payer: Medicare PPO | Attending: Family Medicine | Admitting: Physical Therapy

## 2023-04-03 DIAGNOSIS — M25561 Pain in right knee: Secondary | ICD-10-CM | POA: Diagnosis not present

## 2023-04-03 DIAGNOSIS — M62830 Muscle spasm of back: Secondary | ICD-10-CM | POA: Diagnosis not present

## 2023-04-03 DIAGNOSIS — R262 Difficulty in walking, not elsewhere classified: Secondary | ICD-10-CM | POA: Diagnosis not present

## 2023-04-03 DIAGNOSIS — M5459 Other low back pain: Secondary | ICD-10-CM | POA: Insufficient documentation

## 2023-04-03 NOTE — Therapy (Signed)
OUTPATIENT PHYSICAL THERAPY THORACOLUMBAR TREATMENT    Patient Name: Jenna Mcdonald MRN: [id number] DOB:Aug 12, 1946, 77 y.o., female Today's Date: 04/03/2023  END OF SESSION:  PT End of Session - 04/03/23 0932     Visit Number 18    Number of Visits 24    Date for PT Re-Evaluation 04/14/23    Authorization Type [employer]    PT Start Time 0930    PT Stop Time 1015    PT Time Calculation (min) 45 min             Past Medical History:  Diagnosis Date   Anemia    Aortic stenosis    mild by echo 09/2020 and 08/2022 with mean AVG   Breast CA (HCC)    left   Breast cancer (HCC)    Carotid stenosis    1-39% left   Degenerative disc disease, lumbar    of the knees   Diabetes mellitus    Dysrhythmia    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HCC)    Encounter for therapeutic drug monitoring 08/17/2018   Fatigue    Severe secondary to to gemfibrozil   Gastroesophageal reflux disease    denies   GI bleeding    a. 06/2018: EGD showed multiple nonbleeding duodenal ulcers. 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   Hammertoe    left second toe   Hypercholesteremia    Hypertension    Insomnia    Irritable bowel    Low back pain    Metabolic syndrome    Morbid obesity (HCC)    OSA on CPAP \   bipap   Osteoarthritis    Osteopenia    Persistent atrial fibrillation [facility])    s/p afib ablation - Dr. Johney Frame 08/2018   Personal history of radiation therapy    Pneumonia    PONV (postoperative nausea and vomiting)    Sigmoid diverticulosis    Thalassemia minor    Unstable angina (HCC) 10/23/2020   Vitamin D deficiency    Past Surgical History:  Procedure Laterality Date   ATRIAL FIBRILLATION ABLATION N/A 09/21/2018   Procedure: ATRIAL FIBRILLATION ABLATION;  Surgeon: Hillis Range, MD;  Location: [facility] INVASIVE CV LAB;  Service: Cardiovascular;  Laterality: N/A;   BIOPSY  07/06/2018   Procedure: BIOPSY;  Surgeon: Vida Rigger, MD;  Location: [facility] ENDOSCOPY;  Service: Endoscopy;;   BREAST LUMPECTOMY Left 05/28/2009   BREAST SURGERY Left 2010   lumpectomy   CARDIOVERSION N/A 07/07/2018   Procedure: CARDIOVERSION;  Surgeon: Chilton Si, MD;  Location: [facility] ENDOSCOPY;  Service: Cardiovascular;  Laterality: N/A;   CARDIOVERSION N/A 07/14/2018   Procedure: CARDIOVERSION;  Surgeon: Laurey Morale, MD;  Location: [facility] ENDOSCOPY;  Service: Cardiovascular;  Laterality: N/A;   CESAREAN SECTION     COLONOSCOPY WITH PROPOFOL N/A 07/06/2018   Procedure: COLONOSCOPY WITH PROPOFOL;  Surgeon: Vida Rigger, MD;  Location: [facility] ENDOSCOPY;  Service: Endoscopy;  Laterality: N/A;   CYSTECTOMY  1970   pilonidal    DILATION AND CURETTAGE OF UTERUS     ESOPHAGOGASTRODUODENOSCOPY (EGD) WITH PROPOFOL N/A 07/06/2018   Procedure: ESOPHAGOGASTRODUODENOSCOPY (EGD) WITH PROPOFOL;  Surgeon: Vida Rigger, MD;  Location: [facility] ENDOSCOPY;  Service: Endoscopy;  Laterality: N/A;   EYE SURGERY Bilateral 07/2013   cataracts   HOT HEMOSTASIS N/A 07/06/2018   Procedure: HOT HEMOSTASIS (ARGON PLASMA COAGULATION/BICAP);  Surgeon: Vida Rigger, MD;  Location: [facility] ENDOSCOPY;  Service: Endoscopy;  Laterality: N/A;   implantable loop recorder placement  01/15/2022   [employer] Reveal Linq model [id number] implantable loop recorder (SN [id number]) implanted for AF management by Dr Johney Frame   MAXIMUM ACCESS (MAS)POSTERIOR LUMBAR INTERBODY FUSION (PLIF) 2 LEVEL N/A 09/22/2013   Procedure: FOR MAXIMUM ACCESS  POSTERIOR LUMBAR INTERBODY FUSION LUMBAR THREE-FOUR FOUR-FIVE;  Surgeon: Tia Alert, MD;  Location: [facility] NEURO ORS;  Service: Neurosurgery;  Laterality: N/A;   OOPHORECTOMY     wedge section not rem   REPLACEMENT TOTAL KNEE Left 2011   REPLACEMENT TOTAL KNEE     RIGHT/LEFT HEART CATH AND CORONARY ANGIOGRAPHY N/A 10/26/2020   Procedure: RIGHT/LEFT HEART CATH AND CORONARY ANGIOGRAPHY;  Surgeon: Marykay Lex, MD;  Location: [facility] INVASIVE CV LAB;  Service:  Cardiovascular;  Laterality: N/A;   TEE WITHOUT CARDIOVERSION N/A 07/07/2018   Procedure: TRANSESOPHAGEAL ECHOCARDIOGRAM (TEE);  Surgeon: Chilton Si, MD;  Location: [facility] ENDOSCOPY;  Service: Cardiovascular;  Laterality: N/A;   TEE WITHOUT CARDIOVERSION N/A 09/21/2018   Procedure: TRANSESOPHAGEAL ECHOCARDIOGRAM (TEE);  Surgeon: Elease Hashimoto Deloris Ping, MD;  Location: [facility] ENDOSCOPY;  Service: Cardiovascular;  Laterality: N/A;   TONSILLECTOMY     TUBAL LIGATION     Patient Active Problem List   Diagnosis Date Noted   Shortness of breath    Unstable angina (HCC) 10/23/2020   Hypercoagulable state due to paroxysmal atrial fibrillation (HCC) 10/15/2020   Encounter for therapeutic drug monitoring 08/17/2018   CKD (chronic kidney disease) stage 3, GFR 30-59 ml/min (HCC) 07/21/2018   Chronic anemia 07/21/2018   Anemia    Heme positive stool    Atrial fibrillation with RVR (HCC) 07/04/2018   Paroxysmal atrial fibrillation (HCC) 07/02/2018   Carotid stenosis    Bruit of left carotid artery 09/22/2017   Aortic stenosis    Heart murmur 09/09/2016   Morbid obesity (HCC) 02/05/2016   Dry eye 05/17/2015   Type 2 diabetes mellitus without complication (HCC) 08/10/2014   Breast cancer, left breast (HCC) 07/22/2014   Obesity, Class III, BMI 40-49.9 (morbid obesity) (HCC) 07/06/2014   Macular pseudohole 05/17/2014   History of surgical procedure 05/17/2014   OSA (obstructive sleep apnea) 11/17/2013   S/P lumbar spinal fusion 09/22/2013   Persistent atrial fibrillation (HCC) 09/11/2011    Class: Acute   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HCC) 09/11/2011    Class: Chronic   Essential hypertension 09/11/2011   Exogenous obesity 09/11/2011   Gastroesophageal reflux disease 09/11/2011   Thalassemia minor 09/11/2011    Class: Chronic   Breast cancer (HCC) 09/11/2011    Class: Chronic   Degenerative joint disease 09/11/2011    Class: Chronic   Irritable bowel disease 09/11/2011    Class: Chronic   Lumbar disc  disease 09/11/2011    PCP: Margaretann Loveless, MD  REFERRING PROVIDER: Baird Lyons, DO  REFERRING DIAG: chronic bag and knee pain  Rationale for Evaluation and Treatment: Rehabilitation  THERAPY DIAG:  Other low back pain  Acute pain of right knee  Difficulty in walking, not elsewhere classified  ONSET DATE: 10/14/22  SUBJECTIVE:  SUBJECTIVE STATEMENT: OOT on vacation for week and then got Covid. Back is doing okay - but have not done much either PERTINENT HISTORY:  See above  PAIN:  Are you having pain? RT LB 2/10. Knee pain  PRECAUTIONS: Other: lumbar fusion 2015  WEIGHT BEARING RESTRICTIONS: No  FALLS:  Has patient fallen in last 6 months? No  LIVING ENVIRONMENT: Lives with: lives with their family Lives in: House/apartment Stairs: Yes: Internal: 15 steps; can reach both Has following equipment at home: Single point cane and uses the cane for long distances grocery shopping  OCCUPATION: retired  PLOF: Independent and gardens, does Presenter, broadcasting, cooks and cleans  PATIENT GOALS: strengthen core and have less pain  NEXT MD VISIT: 11/18/22  OBJECTIVE:   DIAGNOSTIC FINDINGS:  No recent x-rays   PATIENT SURVEYS:  ODI = 70% disability  COGNITION: Overall cognitive status: Within functional limits for tasks assessed     SENSATION: WFL  MUSCLE LENGTH: Very tight HS, piriformis and quads  POSTURE: rounded shoulders and forward head  PALPATION: Tender and tight in the low back mms, tightness iin the rhomboids and the upper traps  LUMBAR ROM:   AROM eval 12/09/22  Flexion Decreased 50% 25%  Extension Decreased 100% Decreased 100%  Right lateral flexion Decreased 75% Decreased 75%  Left lateral flexion Decreased 75% Decreased 75%  Right rotation    Left rotation     (Blank rows = not  tested)  LOWER EXTREMITY ROM:     Active  Right eval Left eval  Hip flexion    Hip extension    Hip abduction    Hip adduction    Hip internal rotation    Hip external rotation    Knee flexion 10 5  Knee extension 95 100  Ankle dorsiflexion    Ankle plantarflexion    Ankle inversion    Ankle eversion     (Blank rows = not tested)  LOWER EXTREMITY MMT:    MMT Right eval Left eval  Hip flexion 4- 4-  Hip extension 4- 4-  Hip abduction 4- 4-  Hip adduction    Hip internal rotation    Hip external rotation    Knee flexion 3+ 4-  Knee extension 3+ 4-  Ankle dorsiflexion 4 4  Ankle plantarflexion    Ankle inversion    Ankle eversion     (Blank rows = not tested)  FUNCTIONAL TESTS:  3 minute walk test: 220 feet had to stop at 30 seconds 01/13/23 = 440 feet 6 minute walk test = 600 feet TUG = 16 seconds   GAIT: Distance walked: 220 feet Assistive device utilized: Single point cane Level of assistance: Complete Independence Comments: stopped due to back pain  TODAY'S TREATMENT:                                                                                                                              DATE:  04/03/23 Nustep L 5 10 min 25# leg curls 2x15 5# Leg extension 2x10 25# rows 2 x 12 20# lats  2 x 12 Black tband trunk flex and ext Black tband trunk rotation 15 x each Tricep, Bicep and upright row Pt stated pain on left side when lying on RT side- theragun and stretching performed Discussed and educ on walking pad for home and using walker for stability   03/12/23 Nustep L 6 10 min 25# leg curls 2x15 5# Leg extension 2x10 25# rows 2 x 12 20# lats  2 x 12 Black tband trunk flex and ext Black tband trunk rotation 15 x each Tricep, Bicep and upright row    03/05/23 Nustep level 5 x 10 minutes 10# straight arm pulls with cues for posture and core activation 25# leg curls 2x15 5# Leg extension 2x10 25# rows 20# lats Black tband  triceps 5# biceps Worked with her and problem solved getting up and down from the floor, we practiced this and worked on a few things to hlep her as she is going to try to put a small pool on her deck and she will need to get up and down  02/24/23 Nustep L 5 10 min Black tband trunk flex and ext 20x 20# Rows 2 sets 10 20# lats 2 sets 10 STS wt ball press 10 x LAQ 2 sets 10 HS curls 25# 2 sets 10 Standing bicep curl into ext 10x Standing upright row into ext 10x Dyna disc pelvic ROM  02/09/23 Nustep level 5 x 10 minutes 20# leg cursl, 25# leg curls 10 each 20# Rows 20# lats Black tband lumbar extension Feet on ball K2C, rotation, small bridge, isometric abs Passive stretch HS and piriformis STM to the right buttock  01/30/23 Nustep L 5 10 min Black tband lumbar extension and flexion 2 sets 10 Seated row 25# and lat pull 20# 2 sets 10 Supine core stab Passive stretch LE's/STW RT LB PATIENT EDUCATION:  Education details: POC Person educated: Patient Education method: Explanation Education comprehension: verbalized understanding  HOME EXERCISE PROGRAM: TBD  ASSESSMENT:  CLINICAL IMPRESSION: pt has not been here in 3 weeks- OOT and then had Covid. Overall states back is good but has not done much either. Pt did well with strengthening ex but definitely stated she could feel it more after not being here for 3 weeks. Educ on walking pad for home use and use of walker to stab for balance.Goals assessed OBJECTIVE IMPAIRMENTS: Abnormal gait, cardiopulmonary status limiting activity, decreased activity tolerance, decreased balance, decreased coordination, decreased endurance, decreased mobility, difficulty walking, decreased ROM, decreased strength, increased fascial restrictions, impaired perceived functional ability, increased muscle spasms, impaired flexibility, improper body mechanics, and pain.   REHAB POTENTIAL: Good  CLINICAL DECISION MAKING: Stable/uncomplicated  EVALUATION  COMPLEXITY: Low   GOALS: Goals reviewed with patient? Yes  SHORT TERM GOALS: Target date: 11/24/22  Independent with initial HEP Goal status: 11/27/22 MET  LONG TERM GOALS: Target date: 02/11/23  Independent iwht posture and body mechanics Goal status:met 01/01/23    2.  Walk 500 feet in a 3 minute walk test Goal status:ongoing 02/09/23   02/24/23 progressing  03/12/23 MET  3.  Decrease pain 25% Goal status: met 03/05/23  4.  Be able to cook a meal without pain > 5/10 Goal status: progressing 02/09/23  MET 03/12/23  5.  Independent with HEP or gym Goal status: progressing 01/07/23  progressing 02/24/23  Progressing 03/12/23 04/03/23 progressing  PLAN:  PT FREQUENCY:  1-2x/week  PT DURATION: 12 weeks  PLANNED INTERVENTIONS: Therapeutic exercises, Therapeutic activity, Neuromuscular re-education, Balance training, Gait training, Patient/Family education, Self Care, Joint mobilization, Dry Needling, Electrical stimulation, Cryotherapy, Moist heat, and Manual therapy.  PLAN FOR NEXT SESSION: continue to progress and work towards D/C at end of cert date- will rec gym or our independent gym program     [place], PTA 04/03/2023, 9:32 AM  [facility] at [facility] [address] [place], [place], [address] Phone: [phone]   Fax:  [phone] at [facility] [address] [place], [place], [address] Phone: [phone]   Fax:  [phone]  Patient Details  Name: Angelin Heap MRN: [id number] Date of Birth: [birth year] Referring Provider:  Thana Ates, MD

## 2023-04-07 ENCOUNTER — Ambulatory Visit: Payer: Medicare PPO | Admitting: Physical Therapy

## 2023-04-07 DIAGNOSIS — M62830 Muscle spasm of back: Secondary | ICD-10-CM | POA: Diagnosis not present

## 2023-04-07 DIAGNOSIS — M5459 Other low back pain: Secondary | ICD-10-CM | POA: Diagnosis not present

## 2023-04-07 DIAGNOSIS — R262 Difficulty in walking, not elsewhere classified: Secondary | ICD-10-CM | POA: Diagnosis not present

## 2023-04-07 DIAGNOSIS — M25561 Pain in right knee: Secondary | ICD-10-CM

## 2023-04-07 NOTE — Therapy (Signed)
OUTPATIENT PHYSICAL THERAPY THORACOLUMBAR TREATMENT    Patient Name: Jenna Mcdonald MRN: [id number] DOB:[birth year], 77 y.o., female Today's Date: 04/07/2023  END OF SESSION:  PT End of Session - 04/07/23 0938     Visit Number 19    Number of Visits 24    Date for PT Re-Evaluation 04/14/23    Authorization Type [employer]    PT Start Time 0930    PT Stop Time 1015    PT Time Calculation (min) 45 min             Past Medical History:  Diagnosis Date   Anemia    Aortic stenosis    mild by echo 09/2020 and 08/2022 with mean AVG   Breast CA (HCC)    left   Breast cancer (HCC)    Carotid stenosis    1-39% left   Degenerative disc disease, lumbar    of the knees   Diabetes mellitus    Dysrhythmia    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HCC)    Encounter for therapeutic drug monitoring 08/17/2018   Fatigue    Severe secondary to to gemfibrozil   Gastroesophageal reflux disease    denies   GI bleeding    a. 06/2018: EGD showed multiple nonbleeding duodenal ulcers. 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   Hammertoe    left second toe   Hypercholesteremia    Hypertension    Insomnia    Irritable bowel    Low back pain    Metabolic syndrome    Morbid obesity (HCC)    OSA on CPAP \   bipap   Osteoarthritis    Osteopenia    Persistent atrial fibrillation [facility])    s/p afib ablation - Dr. Johney Frame 08/2018   Personal history of radiation therapy    Pneumonia    PONV (postoperative nausea and vomiting)    Sigmoid diverticulosis    Thalassemia minor    Unstable angina (HCC) 10/23/2020   Vitamin D deficiency    Past Surgical History:  Procedure Laterality Date   ATRIAL FIBRILLATION ABLATION N/A 09/21/2018   Procedure: ATRIAL FIBRILLATION ABLATION;  Surgeon: Hillis Range, MD;  Location: [facility] INVASIVE CV LAB;  Service: Cardiovascular;  Laterality: N/A;   BIOPSY  07/06/2018   Procedure: BIOPSY;  Surgeon:  Vida Rigger, MD;  Location: [facility] ENDOSCOPY;  Service: Endoscopy;;   BREAST LUMPECTOMY Left 05/28/2009   BREAST SURGERY Left 2010   lumpectomy   CARDIOVERSION N/A 07/07/2018   Procedure: CARDIOVERSION;  Surgeon: Chilton Si, MD;  Location: [facility] ENDOSCOPY;  Service: Cardiovascular;  Laterality: N/A;   CARDIOVERSION N/A 07/14/2018   Procedure: CARDIOVERSION;  Surgeon: Laurey Morale, MD;  Location: [facility] ENDOSCOPY;  Service: Cardiovascular;  Laterality: N/A;   CESAREAN SECTION     COLONOSCOPY WITH PROPOFOL N/A 07/06/2018   Procedure: COLONOSCOPY WITH PROPOFOL;  Surgeon: Vida Rigger, MD;  Location: [facility] ENDOSCOPY;  Service: Endoscopy;  Laterality: N/A;   CYSTECTOMY  1970   pilonidal    DILATION AND CURETTAGE OF UTERUS     ESOPHAGOGASTRODUODENOSCOPY (EGD) WITH PROPOFOL N/A 07/06/2018   Procedure: ESOPHAGOGASTRODUODENOSCOPY (EGD) WITH PROPOFOL;  Surgeon: Vida Rigger, MD;  Location: [facility] ENDOSCOPY;  Service: Endoscopy;  Laterality: N/A;   EYE SURGERY Bilateral 07/2013   cataracts   HOT HEMOSTASIS N/A 07/06/2018   Procedure: HOT HEMOSTASIS (ARGON PLASMA COAGULATION/BICAP);  Surgeon: Vida Rigger, MD;  Location: [facility] ENDOSCOPY;  Service: Endoscopy;  Laterality: N/A;   implantable loop recorder placement  01/15/2022   Medtronic Reveal Linq model [id number] implantable loop recorder (SN [id number]) implanted for AF management by Dr Johney Frame   MAXIMUM ACCESS (MAS)POSTERIOR LUMBAR INTERBODY FUSION (PLIF) 2 LEVEL N/A 09/22/2013   Procedure: FOR MAXIMUM ACCESS  POSTERIOR LUMBAR INTERBODY FUSION LUMBAR THREE-FOUR FOUR-FIVE;  Surgeon: Tia Alert, MD;  Location: [facility] NEURO ORS;  Service: Neurosurgery;  Laterality: N/A;   OOPHORECTOMY     wedge section not rem   REPLACEMENT TOTAL KNEE Left 2011   REPLACEMENT TOTAL KNEE     RIGHT/LEFT HEART CATH AND CORONARY ANGIOGRAPHY N/A 10/26/2020   Procedure: RIGHT/LEFT HEART CATH AND CORONARY ANGIOGRAPHY;  Surgeon: Marykay Lex, MD;  Location: [facility] INVASIVE CV LAB;  Service:  Cardiovascular;  Laterality: N/A;   TEE WITHOUT CARDIOVERSION N/A 07/07/2018   Procedure: TRANSESOPHAGEAL ECHOCARDIOGRAM (TEE);  Surgeon: Chilton Si, MD;  Location: [facility] ENDOSCOPY;  Service: Cardiovascular;  Laterality: N/A;   TEE WITHOUT CARDIOVERSION N/A 09/21/2018   Procedure: TRANSESOPHAGEAL ECHOCARDIOGRAM (TEE);  Surgeon: Elease Hashimoto Deloris Ping, MD;  Location: [facility] ENDOSCOPY;  Service: Cardiovascular;  Laterality: N/A;   TONSILLECTOMY     TUBAL LIGATION     Patient Active Problem List   Diagnosis Date Noted   Shortness of breath    Unstable angina (HCC) 10/23/2020   Hypercoagulable state due to paroxysmal atrial fibrillation (HCC) 10/15/2020   Encounter for therapeutic drug monitoring 08/17/2018   CKD (chronic kidney disease) stage 3, GFR 30-59 ml/min (HCC) 07/21/2018   Chronic anemia 07/21/2018   Anemia    Heme positive stool    Atrial fibrillation with RVR (HCC) 07/04/2018   Paroxysmal atrial fibrillation (HCC) 07/02/2018   Carotid stenosis    Bruit of left carotid artery 09/22/2017   Aortic stenosis    Heart murmur 09/09/2016   Morbid obesity (HCC) 02/05/2016   Dry eye 05/17/2015   Type 2 diabetes mellitus without complication (HCC) 08/10/2014   Breast cancer, left breast (HCC) 07/22/2014   Obesity, Class III, BMI 40-49.9 (morbid obesity) (HCC) 07/06/2014   Macular pseudohole 05/17/2014   History of surgical procedure 05/17/2014   OSA (obstructive sleep apnea) 11/17/2013   S/P lumbar spinal fusion 09/22/2013   Persistent atrial fibrillation (HCC) 09/11/2011    Class: Acute   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HCC) 09/11/2011    Class: Chronic   Essential hypertension 09/11/2011   Exogenous obesity 09/11/2011   Gastroesophageal reflux disease 09/11/2011   Thalassemia minor 09/11/2011    Class: Chronic   Breast cancer (HCC) 09/11/2011    Class: Chronic   Degenerative joint disease 09/11/2011    Class: Chronic   Irritable bowel disease 09/11/2011    Class: Chronic   Lumbar disc  disease 09/11/2011    PCP: Margaretann Loveless, MD  REFERRING PROVIDER: Baird Lyons, DO  REFERRING DIAG: chronic bag and knee pain  Rationale for Evaluation and Treatment: Rehabilitation  THERAPY DIAG:  Other low back pain  Acute pain of right knee  Difficulty in walking, not elsewhere classified  ONSET DATE: 10/14/22  SUBJECTIVE:  SUBJECTIVE STATEMENT: muscle sore and fatigued after last session- could tell I had not been here in a while. 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 RT LB 2/10. Knee pain  PRECAUTIONS: Other: lumbar fusion 2015  WEIGHT BEARING RESTRICTIONS: No  FALLS:  Has patient fallen in last 6 months? No  LIVING ENVIRONMENT: Lives with: lives with their family Lives in: House/apartment Stairs: Yes: Internal: 15 steps; can reach both Has following equipment at home: Single point cane and uses the cane for long distances grocery shopping  OCCUPATION: retired  PLOF: Independent and gardens, does Presenter, broadcasting, cooks and cleans  PATIENT GOALS: strengthen core and have less pain  NEXT MD VISIT: 11/18/22  OBJECTIVE:   DIAGNOSTIC FINDINGS:  No recent x-rays   PATIENT SURVEYS:  ODI = 70% disability  COGNITION: Overall cognitive status: Within functional limits for tasks assessed     SENSATION: WFL  MUSCLE LENGTH: Very tight HS, piriformis and quads  POSTURE: rounded shoulders and forward head  PALPATION: Tender and tight in the low back mms, tightness iin the rhomboids and the upper traps  LUMBAR ROM:   AROM eval 12/09/22  Flexion Decreased 50% 25%  Extension Decreased 100% Decreased 100%  Right lateral flexion Decreased 75% Decreased 75%  Left lateral flexion Decreased 75% Decreased 75%  Right rotation    Left rotation      (Blank rows = not tested)  LOWER EXTREMITY ROM:     Active  Right eval Left eval  Hip flexion    Hip extension    Hip abduction    Hip adduction    Hip internal rotation    Hip external rotation    Knee flexion 10 5  Knee extension 95 100  Ankle dorsiflexion    Ankle plantarflexion    Ankle inversion    Ankle eversion     (Blank rows = not tested)  LOWER EXTREMITY MMT:    MMT Right eval Left eval  Hip flexion 4- 4-  Hip extension 4- 4-  Hip abduction 4- 4-  Hip adduction    Hip internal rotation    Hip external rotation    Knee flexion 3+ 4-  Knee extension 3+ 4-  Ankle dorsiflexion 4 4  Ankle plantarflexion    Ankle inversion    Ankle eversion     (Blank rows = not tested)  FUNCTIONAL TESTS:  3 minute walk test: 220 feet had to stop at 30 seconds 01/13/23 = 440 feet 6 minute walk test = 600 feet TUG = 16 seconds   GAIT: Distance walked: 220 feet Assistive device utilized: Single point cane Level of assistance: Complete Independence Comments: stopped due to back pain  TODAY'S TREATMENT:                                                                                                                              DATE:  04/07/23 Walk outside 5 min 45 sec. At 3 min pace slowed, at 4 min she requested we turn around d/t fatigue-SOB noted Nustep L 5 25# rows 2 x 12 20# lats  2 x 12 Black tband trunk flex and ext Black tband trunk rotation 15 x each STS with wt ball chest press 10 x then OH press 10 x Red tband LAQ 2 sets 10 Green tband HS curl 2 sets 10       04/03/23 Nustep L 5 10 min 25# leg curls 2x15 5# Leg extension 2x10 25# rows 2 x 12 20# lats  2 x 12 Black tband trunk flex and ext Black tband trunk rotation 15 x each Tricep, Bicep and upright row Pt stated pain on left side when lying on RT side- theragun and stretching performed Discussed and educ on walking pad for home and using walker for  stability   03/12/23 Nustep L 6 10 min 25# leg curls 2x15 5# Leg extension 2x10 25# rows 2 x 12 20# lats  2 x 12 Black tband trunk flex and ext Black tband trunk rotation 15 x each Tricep, Bicep and upright row    03/05/23 Nustep level 5 x 10 minutes 10# straight arm pulls with cues for posture and core activation 25# leg curls 2x15 5# Leg extension 2x10 25# rows 20# lats Black tband triceps 5# biceps Worked with her and problem solved getting up and down from the floor, we practiced this and worked on a few things to hlep her as she is going to try to put a small pool on her deck and she will need to get up and down  02/24/23 Nustep L 5 10 min Black tband trunk flex and ext 20x 20# Rows 2 sets 10 20# lats 2 sets 10 STS wt ball press 10 x LAQ 2 sets 10 HS curls 25# 2 sets 10 Standing bicep curl into ext 10x Standing upright row into ext 10x Dyna disc pelvic ROM  02/09/23 Nustep level 5 x 10 minutes 20# leg cursl, 25# leg curls 10 each 20# Rows 20# lats Black tband lumbar extension Feet on ball K2C, rotation, small bridge, isometric abs Passive stretch HS and piriformis STM to the right buttock  01/30/23 Nustep L 5 10 min Black tband lumbar extension and flexion 2 sets 10 Seated row 25# and lat pull 20# 2 sets 10 Supine core stab Passive stretch LE's/STW RT LB PATIENT EDUCATION:  Education details: POC Person educated: Patient Education method: Explanation Education comprehension: verbalized understanding  HOME EXERCISE PROGRAM: TBD  ASSESSMENT:  CLINICAL IMPRESSION: pt stataes sore after last session but that is because she had not been in 3 weeks with vacation and covid. Pt states she signed up for [employer] yesterday. Progressed there ex with strength and endurance. Walk outside 5 min 45 sec. At 3 min pace slowed, at 4 min she requested we turn around d/t fatigue-SOB noted OBJECTIVE IMPAIRMENTS: Abnormal gait, cardiopulmonary status limiting  activity, decreased activity tolerance, decreased balance, decreased coordination, decreased endurance, decreased mobility, difficulty walking, decreased ROM, decreased strength, increased fascial restrictions, impaired perceived functional ability, increased muscle spasms, impaired flexibility, improper body mechanics, and pain.   REHAB POTENTIAL: Good  CLINICAL DECISION MAKING: Stable/uncomplicated  EVALUATION COMPLEXITY: Low   GOALS: Goals reviewed with patient? Yes  SHORT TERM GOALS: Target date: 11/24/22  Independent with initial HEP Goal status: 11/27/22 MET  LONG TERM GOALS: Target date: 02/11/23  Independent iwht posture and body mechanics Goal  status:met 01/01/23    2.  Walk 500 feet in a 3 minute walk test Goal status:ongoing 02/09/23   02/24/23 progressing  03/12/23 MET  3.  Decrease pain 25% Goal status: met 03/05/23  4.  Be able to cook a meal without pain > 5/10 Goal status: progressing 02/09/23  MET 03/12/23  5.  Independent with HEP or gym Goal status: progressing 01/07/23  progressing 02/24/23  Progressing 03/12/23 04/03/23 progressing  PLAN:  PT FREQUENCY: 1-2x/week  PT DURATION: 12 weeks  PLANNED INTERVENTIONS: Therapeutic exercises, Therapeutic activity, Neuromuscular re-education, Balance training, Gait training, Patient/Family education, Self Care, Joint mobilization, Dry Needling, Electrical stimulation, Cryotherapy, Moist heat, and Manual therapy.  PLAN FOR NEXT SESSION: continue to progress and work towards D/C at end of cert date- will rec gym or our independent gym program     [place], PTA 04/07/2023, 9:38 AM  [facility] Out[facility] at [facility] [address] [facility] [address] [place], [place], [address] Phone: [phone]   Fax:  [phone] at [facility] [address] [place], [place], [address] Phone: [phone]   Fax:  [phone]  Patient Details  Name: Jenna Mcdonald MRN: [id number] Date of Birth: November 21, 1945 Referring Provider:  Helane Rima, DO [facility] Outpatient Rehabilitation at [facility]. [place], [place], [address] Phone: [phone]   Fax:  [phone]  Patient Details  Name: Jenna Mcdonald MRN: [id number] Date of Birth: Feb 03, 1946 Referring Provider:  Helane Rima, DO  Encounter Date: 04/07/2023   Suanne Marker, PTA 04/07/2023, 9:38 AM  [facility] at [facility] [address] [facility] [place], [place], [address] Phone: [phone]   Fax:  [phone]

## 2023-04-09 ENCOUNTER — Telehealth (HOSPITAL_COMMUNITY): Payer: Self-pay | Admitting: Internal Medicine

## 2023-04-09 NOTE — Telephone Encounter (Signed)
Patient called and asked to see another provide, there was no reason given.

## 2023-04-10 ENCOUNTER — Ambulatory Visit: Payer: Medicare PPO | Admitting: Physical Therapy

## 2023-04-10 DIAGNOSIS — M25561 Pain in right knee: Secondary | ICD-10-CM | POA: Diagnosis not present

## 2023-04-10 DIAGNOSIS — R262 Difficulty in walking, not elsewhere classified: Secondary | ICD-10-CM | POA: Diagnosis not present

## 2023-04-10 DIAGNOSIS — M5459 Other low back pain: Secondary | ICD-10-CM

## 2023-04-10 DIAGNOSIS — M62830 Muscle spasm of back: Secondary | ICD-10-CM | POA: Diagnosis not present

## 2023-04-10 NOTE — Therapy (Signed)
OUTPATIENT PHYSICAL THERAPY THORACOLUMBAR TREATMENT    Patient Name: Jenna Mcdonald MRN: [id number] DOB:March 09, 1946, 77 y.o., female Today's Date: 04/10/2023  END OF SESSION:  PT End of Session - 04/10/23 0928     Visit Number 20    Number of Visits 24    Date for PT Re-Evaluation 04/14/23    Authorization Type [employer]    PT Start Time 0930    PT Stop Time 1015    PT Time Calculation (min) 45 min             Past Medical History:  Diagnosis Date   Anemia    Aortic stenosis    mild by echo 09/2020 and 08/2022 with mean AVG   Breast CA (HCC)    left   Breast cancer (HCC)    Carotid stenosis    1-39% left   Degenerative disc disease, lumbar    of the knees   Diabetes mellitus    Dysrhythmia    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HCC)    Encounter for therapeutic drug monitoring 08/17/2018   Fatigue    Severe secondary to to gemfibrozil   Gastroesophageal reflux disease    denies   GI bleeding    a. 06/2018: EGD showed multiple nonbleeding duodenal ulcers. 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   Hammertoe    left second toe   Hypercholesteremia    Hypertension    Insomnia    Irritable bowel    Low back pain    Metabolic syndrome    Morbid obesity (HCC)    OSA on CPAP \   bipap   Osteoarthritis    Osteopenia    Persistent atrial fibrillation [facility])    s/p afib ablation - Dr. Johney Frame 08/2018   Personal history of radiation therapy    Pneumonia    PONV (postoperative nausea and vomiting)    Sigmoid diverticulosis    Thalassemia minor    Unstable angina (HCC) 10/23/2020   Vitamin D deficiency    Past Surgical History:  Procedure Laterality Date   ATRIAL FIBRILLATION ABLATION N/A 09/21/2018   Procedure: ATRIAL FIBRILLATION ABLATION;  Surgeon: Hillis Range, MD;  Location: [facility] INVASIVE CV LAB;  Service: Cardiovascular;  Laterality: N/A;   BIOPSY  07/06/2018   Procedure: BIOPSY;  Surgeon:  Vida Rigger, MD;  Location: [facility] ENDOSCOPY;  Service: Endoscopy;;   BREAST LUMPECTOMY Left 05/28/2009   BREAST SURGERY Left 2010   lumpectomy   CARDIOVERSION N/A 07/07/2018   Procedure: CARDIOVERSION;  Surgeon: Chilton Si, MD;  Location: [facility] ENDOSCOPY;  Service: Cardiovascular;  Laterality: N/A;   CARDIOVERSION N/A 07/14/2018   Procedure: CARDIOVERSION;  Surgeon: Laurey Morale, MD;  Location: [facility] ENDOSCOPY;  Service: Cardiovascular;  Laterality: N/A;   CESAREAN SECTION     COLONOSCOPY WITH PROPOFOL N/A 07/06/2018   Procedure: COLONOSCOPY WITH PROPOFOL;  Surgeon: Vida Rigger, MD;  Location: [facility] ENDOSCOPY;  Service: Endoscopy;  Laterality: N/A;   CYSTECTOMY  1970   pilonidal    DILATION AND CURETTAGE OF UTERUS     ESOPHAGOGASTRODUODENOSCOPY (EGD) WITH PROPOFOL N/A 07/06/2018   Procedure: ESOPHAGOGASTRODUODENOSCOPY (EGD) WITH PROPOFOL;  Surgeon: Vida Rigger, MD;  Location: [facility] ENDOSCOPY;  Service: Endoscopy;  Laterality: N/A;   EYE SURGERY Bilateral 07/2013   cataracts   HOT HEMOSTASIS N/A 07/06/2018   Procedure: HOT HEMOSTASIS (ARGON PLASMA COAGULATION/BICAP);  Surgeon: Vida Rigger, MD;  Location: [facility] ENDOSCOPY;  Service: Endoscopy;  Laterality: N/A;   implantable loop recorder placement  01/15/2022   Medtronic Reveal Linq model [id number] implantable loop recorder (SN [id number]) implanted for AF management by Dr Johney Frame   MAXIMUM ACCESS (MAS)POSTERIOR LUMBAR INTERBODY FUSION (PLIF) 2 LEVEL N/A 09/22/2013   Procedure: FOR MAXIMUM ACCESS  POSTERIOR LUMBAR INTERBODY FUSION LUMBAR THREE-FOUR FOUR-FIVE;  Surgeon: Tia Alert, MD;  Location: [facility] NEURO ORS;  Service: Neurosurgery;  Laterality: N/A;   OOPHORECTOMY     wedge section not rem   REPLACEMENT TOTAL KNEE Left 2011   REPLACEMENT TOTAL KNEE     RIGHT/LEFT HEART CATH AND CORONARY ANGIOGRAPHY N/A 10/26/2020   Procedure: RIGHT/LEFT HEART CATH AND CORONARY ANGIOGRAPHY;  Surgeon: Marykay Lex, MD;  Location: [facility] INVASIVE CV LAB;  Service:  Cardiovascular;  Laterality: N/A;   TEE WITHOUT CARDIOVERSION N/A 07/07/2018   Procedure: TRANSESOPHAGEAL ECHOCARDIOGRAM (TEE);  Surgeon: Chilton Si, MD;  Location: [facility] ENDOSCOPY;  Service: Cardiovascular;  Laterality: N/A;   TEE WITHOUT CARDIOVERSION N/A 09/21/2018   Procedure: TRANSESOPHAGEAL ECHOCARDIOGRAM (TEE);  Surgeon: Elease Hashimoto Deloris Ping, MD;  Location: [facility] ENDOSCOPY;  Service: Cardiovascular;  Laterality: N/A;   TONSILLECTOMY     TUBAL LIGATION     Patient Active Problem List   Diagnosis Date Noted   Shortness of breath    Unstable angina (HCC) 10/23/2020   Hypercoagulable state due to paroxysmal atrial fibrillation (HCC) 10/15/2020   Encounter for therapeutic drug monitoring 08/17/2018   CKD (chronic kidney disease) stage 3, GFR 30-59 ml/min (HCC) 07/21/2018   Chronic anemia 07/21/2018   Anemia    Heme positive stool    Atrial fibrillation with RVR (HCC) 07/04/2018   Paroxysmal atrial fibrillation (HCC) 07/02/2018   Carotid stenosis    Bruit of left carotid artery 09/22/2017   Aortic stenosis    Heart murmur 09/09/2016   Morbid obesity (HCC) 02/05/2016   Dry eye 05/17/2015   Type 2 diabetes mellitus without complication (HCC) 08/10/2014   Breast cancer, left breast (HCC) 07/22/2014   Obesity, Class III, BMI 40-49.9 (morbid obesity) (HCC) 07/06/2014   Macular pseudohole 05/17/2014   History of surgical procedure 05/17/2014   OSA (obstructive sleep apnea) 11/17/2013   S/P lumbar spinal fusion 09/22/2013   Persistent atrial fibrillation (HCC) 09/11/2011    Class: Acute   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HCC) 09/11/2011    Class: Chronic   Essential hypertension 09/11/2011   Exogenous obesity 09/11/2011   Gastroesophageal reflux disease 09/11/2011   Thalassemia minor 09/11/2011    Class: Chronic   Breast cancer (HCC) 09/11/2011    Class: Chronic   Degenerative joint disease 09/11/2011    Class: Chronic   Irritable bowel disease 09/11/2011    Class: Chronic   Lumbar disc  disease 09/11/2011    PCP: Margaretann Loveless, MD  REFERRING PROVIDER: Baird Lyons, DO  REFERRING DIAG: chronic bag and knee pain  Rationale for Evaluation and Treatment: Rehabilitation  THERAPY DIAG:  Other low back pain  Acute pain of right knee  Difficulty in walking, not elsewhere classified  ONSET DATE: 10/14/22  SUBJECTIVE:  SUBJECTIVE STATEMENT: tired but good  PERTINENT HISTORY:  See above  PAIN:  Are you having pain? RT LB 2/10. Knee pain  PRECAUTIONS: Other: lumbar fusion 2015  WEIGHT BEARING RESTRICTIONS: No  FALLS:  Has patient fallen in last 6 months? No  LIVING ENVIRONMENT: Lives with: lives with their family Lives in: House/apartment Stairs: Yes: Internal: 15 steps; can reach both Has following equipment at home: Single point cane and uses the cane for long distances grocery shopping  OCCUPATION: retired  PLOF: Independent and gardens, does Presenter, broadcasting, cooks and cleans  PATIENT GOALS: strengthen core and have less pain  NEXT MD VISIT: 11/18/22  OBJECTIVE:   DIAGNOSTIC FINDINGS:  No recent x-rays   PATIENT SURVEYS:  ODI = 70% disability  COGNITION: Overall cognitive status: Within functional limits for tasks assessed     SENSATION: WFL  MUSCLE LENGTH: Very tight HS, piriformis and quads  POSTURE: rounded shoulders and forward head  PALPATION: Tender and tight in the low back mms, tightness iin the rhomboids and the upper traps  LUMBAR ROM:   AROM eval 12/09/22  Flexion Decreased 50% 25%  Extension Decreased 100% Decreased 100%  Right lateral flexion Decreased 75% Decreased 75%  Left lateral flexion Decreased 75% Decreased 75%  Right rotation    Left rotation     (Blank rows = not tested)  LOWER EXTREMITY ROM:     Active  Right eval Left eval  Hip  flexion    Hip extension    Hip abduction    Hip adduction    Hip internal rotation    Hip external rotation    Knee flexion 10 5  Knee extension 95 100  Ankle dorsiflexion    Ankle plantarflexion    Ankle inversion    Ankle eversion     (Blank rows = not tested)  LOWER EXTREMITY MMT:    MMT Right eval Left eval  Hip flexion 4- 4-  Hip extension 4- 4-  Hip abduction 4- 4-  Hip adduction    Hip internal rotation    Hip external rotation    Knee flexion 3+ 4-  Knee extension 3+ 4-  Ankle dorsiflexion 4 4  Ankle plantarflexion    Ankle inversion    Ankle eversion     (Blank rows = not tested)  FUNCTIONAL TESTS:  3 minute walk test: 220 feet had to stop at 30 seconds 01/13/23 = 440 feet 6 minute walk test = 600 feet TUG = 16 seconds   GAIT: Distance walked: 220 feet Assistive device utilized: Single point cane Level of assistance: Complete Independence Comments: stopped due to back pain  TODAY'S TREATMENT:                                                                                                                              DATE:    04/10/23 Nustep 10 min L 5 25# rows 2 x 12 20# lats  2 x  12 Black tband trunk flex and ext Black tband trunk rotation 15 x each HS curl 20# 2 sets 12 LAQ red tband 2 sets 10 STS with wt ball press 10 x Wt ball OH ext and rotation 8 # lateral flexion 10 x each side    04/07/23 Walk outside 5 min 45 sec. At 3 min pace slowed, at 4 min she requested we turn around d/t fatigue-SOB noted Nustep L 5 25# rows 2 x 12 20# lats  2 x 12 Black tband trunk flex and ext Black tband trunk rotation 15 x each STS with wt ball chest press 10 x then OH press 10 x Red tband LAQ 2 sets 10 Green tband HS curl 2 sets 10       04/03/23 Nustep L 5 10 min 25# leg curls 2x15 5# Leg extension 2x10 25# rows 2 x 12 20# lats  2 x 12 Black tband trunk flex and ext Black tband trunk rotation 15 x each Tricep, Bicep and  upright row Pt stated pain on left side when lying on RT side- theragun and stretching performed Discussed and educ on walking pad for home and using walker for stability   03/12/23 Nustep L 6 10 min 25# leg curls 2x15 5# Leg extension 2x10 25# rows 2 x 12 20# lats  2 x 12 Black tband trunk flex and ext Black tband trunk rotation 15 x each Tricep, Bicep and upright row    03/05/23 Nustep level 5 x 10 minutes 10# straight arm pulls with cues for posture and core activation 25# leg curls 2x15 5# Leg extension 2x10 25# rows 20# lats Black tband triceps 5# biceps Worked with her and problem solved getting up and down from the floor, we practiced this and worked on a few things to hlep her as she is going to try to put a small pool on her deck and she will need to get up and down  02/24/23 Nustep L 5 10 min Black tband trunk flex and ext 20x 20# Rows 2 sets 10 20# lats 2 sets 10 STS wt ball press 10 x LAQ 2 sets 10 HS curls 25# 2 sets 10 Standing bicep curl into ext 10x Standing upright row into ext 10x Dyna disc pelvic ROM  02/09/23 Nustep level 5 x 10 minutes 20# leg cursl, 25# leg curls 10 each 20# Rows 20# lats Black tband lumbar extension Feet on ball K2C, rotation, small bridge, isometric abs Passive stretch HS and piriformis STM to the right buttock  01/30/23 Nustep L 5 10 min Black tband lumbar extension and flexion 2 sets 10 Seated row 25# and lat pull 20# 2 sets 10 Supine core stab Passive stretch LE's/STW RT LB PATIENT EDUCATION:  Education details: POC Person educated: Patient Education method: Explanation Education comprehension: verbalized understanding  HOME EXERCISE PROGRAM: TBD  ASSESSMENT:  CLINICAL IMPRESSION: all goals met except final HEP- will work on this next week to have a good Mining engineer for D/C Scientific laboratory technician or our gym program. ) focus today on continuing to build strength and stamina   OBJECTIVE IMPAIRMENTS: Abnormal gait,  cardiopulmonary status limiting activity, decreased activity tolerance, decreased balance, decreased coordination, decreased endurance, decreased mobility, difficulty walking, decreased ROM, decreased strength, increased fascial restrictions, impaired perceived functional ability, increased muscle spasms, impaired flexibility, improper body mechanics, and pain.   REHAB POTENTIAL: Good  CLINICAL DECISION MAKING: Stable/uncomplicated  EVALUATION COMPLEXITY: Low   GOALS: Goals reviewed with patient? Yes  SHORT TERM GOALS: Target date: 11/24/22  Independent with initial HEP Goal status: 11/27/22 MET  LONG TERM GOALS: Target date: 02/11/23  Independent iwht posture and body mechanics Goal status:met 01/01/23    2.  Walk 500 feet in a 3 minute walk test Goal status:ongoing 02/09/23   02/24/23 progressing  03/12/23 MET  3.  Decrease pain 25% Goal status: met 03/05/23  4.  Be able to cook a meal without pain > 5/10 Goal status: progressing 02/09/23  MET 03/12/23  5.  Independent with HEP or gym Goal status: progressing 01/07/23  progressing 02/24/23  Progressing 03/12/23 04/03/23 progressing  PLAN:  PT FREQUENCY: 1-2x/week  PT DURATION: 12 weeks  PLANNED INTERVENTIONS: Therapeutic exercises, Therapeutic activity, Neuromuscular re-education, Balance training, Gait training, Patient/Family education, Self Care, Joint mobilization, Dry Needling, Electrical stimulation, Cryotherapy, Moist heat, and Manual therapy.  PLAN FOR NEXT SESSION: continue to progress and work towards D/C at end of cert date- will rec gym ( pt has [employer] education careers adviser our independent gym program     [place], PTA 04/10/2023, 9:30 AM  [facility] Outpatient Rehabilitation at [facility] [place], [place], [address] Phone: [phone]   Fax:  [phone] at [facility] [address] [place]. [place], [place], [address] Phone: [phone]    Fax:  [phone]  Patient Details  Name: Sarajo Dunkerson MRN: [id number] Date of Birth: Apr 29, 1946 Referring Provider:  Helane Rima, DO [place] [facility] Outpatient Rehabilitation at [facility] [place], [place], [address] Phone: [phone]   Fax:  [phone]   [facility] Outpatient Rehabilitation at [facility] [address] [place], [place], [address] Phone: [phone]   Fax:  [phone]

## 2023-04-13 ENCOUNTER — Ambulatory Visit: Payer: Medicare PPO | Admitting: Physical Therapy

## 2023-04-13 ENCOUNTER — Encounter: Payer: Self-pay | Admitting: Physical Therapy

## 2023-04-13 DIAGNOSIS — M5459 Other low back pain: Secondary | ICD-10-CM

## 2023-04-13 DIAGNOSIS — M25561 Pain in right knee: Secondary | ICD-10-CM

## 2023-04-13 DIAGNOSIS — M62830 Muscle spasm of back: Secondary | ICD-10-CM | POA: Diagnosis not present

## 2023-04-13 DIAGNOSIS — R262 Difficulty in walking, not elsewhere classified: Secondary | ICD-10-CM | POA: Diagnosis not present

## 2023-04-13 NOTE — Therapy (Signed)
OUTPATIENT PHYSICAL THERAPY THORACOLUMBAR TREATMENT    Patient Name: Denis Skalka MRN: [id number] DOB:[birth year], 77 y.o., female Today's Date: 04/13/2023  END OF SESSION:  PT End of Session - 04/13/23 1315     Visit Number 21    Number of Visits 24    Date for PT Re-Evaluation 04/14/23    Authorization Type [employer]    PT Start Time 1312    PT Stop Time 1358    PT Time Calculation (min) 46 min    Activity Tolerance Patient tolerated treatment well    Behavior During Therapy [facility] for tasks assessed/performed             Past Medical History:  Diagnosis Date   Anemia    Aortic stenosis    mild by echo 09/2020 and 08/2022 with mean AVG   Breast CA (HCC)    left   Breast cancer (HCC)    Carotid stenosis    1-39% left   Degenerative disc disease, lumbar    of the knees   Diabetes mellitus    Dysrhythmia    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HCC)    Encounter for therapeutic drug monitoring 08/17/2018   Fatigue    Severe secondary to to gemfibrozil   Gastroesophageal reflux disease    denies   GI bleeding    a. 06/2018: EGD showed multiple nonbleeding duodenal ulcers. 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   Hammertoe    left second toe   Hypercholesteremia    Hypertension    Insomnia    Irritable bowel    Low back pain    Metabolic syndrome    Morbid obesity (HCC)    OSA on CPAP \   bipap   Osteoarthritis    Osteopenia    Persistent atrial fibrillation [facility])    s/p afib ablation - Dr. Johney Frame 08/2018   Personal history of radiation therapy    Pneumonia    PONV (postoperative nausea and vomiting)    Sigmoid diverticulosis    Thalassemia minor    Unstable angina (HCC) 10/23/2020   Vitamin D deficiency    Past Surgical History:  Procedure Laterality Date   ATRIAL FIBRILLATION ABLATION N/A 09/21/2018   Procedure: ATRIAL FIBRILLATION ABLATION;  Surgeon: Hillis Range, MD;  Location: [facility]  INVASIVE CV LAB;  Service: Cardiovascular;  Laterality: N/A;   BIOPSY  07/06/2018   Procedure: BIOPSY;  Surgeon: Vida Rigger, MD;  Location: [facility] ENDOSCOPY;  Service: Endoscopy;;   BREAST LUMPECTOMY Left 05/28/2009   BREAST SURGERY Left 2010   lumpectomy   CARDIOVERSION N/A 07/07/2018   Procedure: CARDIOVERSION;  Surgeon: Chilton Si, MD;  Location: [facility] ENDOSCOPY;  Service: Cardiovascular;  Laterality: N/A;   CARDIOVERSION N/A 07/14/2018   Procedure: CARDIOVERSION;  Surgeon: Laurey Morale, MD;  Location: [facility] ENDOSCOPY;  Service: Cardiovascular;  Laterality: N/A;   CESAREAN SECTION     COLONOSCOPY WITH PROPOFOL N/A 07/06/2018   Procedure: COLONOSCOPY WITH PROPOFOL;  Surgeon: Vida Rigger, MD;  Location: [facility] ENDOSCOPY;  Service: Endoscopy;  Laterality: N/A;   CYSTECTOMY  1970   pilonidal    DILATION AND CURETTAGE OF UTERUS     ESOPHAGOGASTRODUODENOSCOPY (EGD) WITH PROPOFOL N/A 07/06/2018   Procedure: ESOPHAGOGASTRODUODENOSCOPY (EGD) WITH PROPOFOL;  Surgeon: Vida Rigger, MD;  Location: [facility] ENDOSCOPY;  Service: Endoscopy;  Laterality: N/A;   EYE SURGERY Bilateral 07/2013   cataracts   HOT HEMOSTASIS N/A 07/06/2018   Procedure: HOT HEMOSTASIS (ARGON PLASMA COAGULATION/BICAP);  Surgeon:  Vida Rigger, MD;  Location: [facility] ENDOSCOPY;  Service: Endoscopy;  Laterality: N/A;   implantable loop recorder placement  01/15/2022   Medtronic Reveal Linq model [id number] implantable loop recorder (SN [id number]) implanted for AF management by Dr Johney Frame   MAXIMUM ACCESS (MAS)POSTERIOR LUMBAR INTERBODY FUSION (PLIF) 2 LEVEL N/A 09/22/2013   Procedure: FOR MAXIMUM ACCESS  POSTERIOR LUMBAR INTERBODY FUSION LUMBAR THREE-FOUR FOUR-FIVE;  Surgeon: Tia Alert, MD;  Location: [facility] NEURO ORS;  Service: Neurosurgery;  Laterality: N/A;   OOPHORECTOMY     wedge section not rem   REPLACEMENT TOTAL KNEE Left 2011   REPLACEMENT TOTAL KNEE     RIGHT/LEFT HEART CATH AND CORONARY ANGIOGRAPHY N/A 10/26/2020   Procedure:  RIGHT/LEFT HEART CATH AND CORONARY ANGIOGRAPHY;  Surgeon: Marykay Lex, MD;  Location: [facility] INVASIVE CV LAB;  Service: Cardiovascular;  Laterality: N/A;   TEE WITHOUT CARDIOVERSION N/A 07/07/2018   Procedure: TRANSESOPHAGEAL ECHOCARDIOGRAM (TEE);  Surgeon: Chilton Si, MD;  Location: [facility] ENDOSCOPY;  Service: Cardiovascular;  Laterality: N/A;   TEE WITHOUT CARDIOVERSION N/A 09/21/2018   Procedure: TRANSESOPHAGEAL ECHOCARDIOGRAM (TEE);  Surgeon: Elease Hashimoto Deloris Ping, MD;  Location: [facility] ENDOSCOPY;  Service: Cardiovascular;  Laterality: N/A;   TONSILLECTOMY     TUBAL LIGATION     Patient Active Problem List   Diagnosis Date Noted   Shortness of breath    Unstable angina (HCC) 10/23/2020   Hypercoagulable state due to paroxysmal atrial fibrillation (HCC) 10/15/2020   Encounter for therapeutic drug monitoring 08/17/2018   CKD (chronic kidney disease) stage 3, GFR 30-59 ml/min (HCC) 07/21/2018   Chronic anemia 07/21/2018   Anemia    Heme positive stool    Atrial fibrillation with RVR (HCC) 07/04/2018   Paroxysmal atrial fibrillation (HCC) 07/02/2018   Carotid stenosis    Bruit of left carotid artery 09/22/2017   Aortic stenosis    Heart murmur 09/09/2016   Morbid obesity (HCC) 02/05/2016   Dry eye 05/17/2015   Type 2 diabetes mellitus without complication (HCC) 08/10/2014   Breast cancer, left breast (HCC) 07/22/2014   Obesity, Class III, BMI 40-49.9 (morbid obesity) (HCC) 07/06/2014   Macular pseudohole 05/17/2014   History of surgical procedure 05/17/2014   OSA (obstructive sleep apnea) 11/17/2013   S/P lumbar spinal fusion 09/22/2013   Persistent atrial fibrillation (HCC) 09/11/2011    Class: Acute   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HCC) 09/11/2011    Class: Chronic   Essential hypertension 09/11/2011   Exogenous obesity 09/11/2011   Gastroesophageal reflux disease 09/11/2011   Thalassemia minor 09/11/2011    Class: Chronic   Breast cancer (HCC) 09/11/2011    Class: Chronic    Degenerative joint disease 09/11/2011    Class: Chronic   Irritable bowel disease 09/11/2011    Class: Chronic   Lumbar disc disease 09/11/2011    PCP: Margaretann Loveless, MD  REFERRING PROVIDER: Baird Lyons, DO  REFERRING DIAG: chronic bag and knee pain  Rationale for Evaluation and Treatment: Rehabilitation  THERAPY DIAG:  Other low back pain  Acute pain of right knee  Difficulty in walking, not elsewhere classified  Muscle spasm of back  ONSET DATE: 10/14/22  SUBJECTIVE:  SUBJECTIVE STATEMENT: doing okay I am thinking of going back to the gym  PERTINENT HISTORY:  See above  PAIN:  Are you having pain? RT LB 2/10. Knee pain  PRECAUTIONS: Other: lumbar fusion 2015  WEIGHT BEARING RESTRICTIONS: No  FALLS:  Has patient fallen in last 6 months? No  LIVING ENVIRONMENT: Lives with: lives with their family Lives in: House/apartment Stairs: Yes: Internal: 15 steps; can reach both Has following equipment at home: Single point cane and uses the cane for long distances grocery shopping  OCCUPATION: retired  PLOF: Independent and gardens, does Presenter, broadcasting, cooks and cleans  PATIENT GOALS: strengthen core and have less pain  NEXT MD VISIT: 11/18/22  OBJECTIVE:   DIAGNOSTIC FINDINGS:  No recent x-rays   PATIENT SURVEYS:  ODI = 70% disability  COGNITION: Overall cognitive status: Within functional limits for tasks assessed     SENSATION: WFL  MUSCLE LENGTH: Very tight HS, piriformis and quads  POSTURE: rounded shoulders and forward head  PALPATION: Tender and tight in the low back mms, tightness iin the rhomboids and the upper traps  LUMBAR ROM:   AROM eval 12/09/22  Flexion Decreased 50% 25%  Extension Decreased 100% Decreased 100%  Right lateral flexion Decreased 75% Decreased 75%   Left lateral flexion Decreased 75% Decreased 75%  Right rotation    Left rotation     (Blank rows = not tested)  LOWER EXTREMITY ROM:     Active  Right eval Left eval  Hip flexion    Hip extension    Hip abduction    Hip adduction    Hip internal rotation    Hip external rotation    Knee flexion 10 5  Knee extension 95 100  Ankle dorsiflexion    Ankle plantarflexion    Ankle inversion    Ankle eversion     (Blank rows = not tested)  LOWER EXTREMITY MMT:    MMT Right eval Left eval  Hip flexion 4- 4-  Hip extension 4- 4-  Hip abduction 4- 4-  Hip adduction    Hip internal rotation    Hip external rotation    Knee flexion 3+ 4-  Knee extension 3+ 4-  Ankle dorsiflexion 4 4  Ankle plantarflexion    Ankle inversion    Ankle eversion     (Blank rows = not tested)  FUNCTIONAL TESTS:  3 minute walk test: 220 feet had to stop at 30 seconds 01/13/23 = 440 feet 6 minute walk test = 600 feet TUG = 16 seconds   GAIT: Distance walked: 220 feet Assistive device utilized: Single point cane Level of assistance: Complete Independence Comments: stopped due to back pain  TODAY'S TREATMENT:                                                                                                                              DATE:   04/13/23 Nustep level 5 x 10 minutes Rows 15#  Lats 15# Black tband lumbar extension and abs 20# triceps 5# biceps Education on all the above exercises , reps, weights and form  04/10/23 Nustep 10 min L 5 25# rows 2 x 12 20# lats  2 x 12 Black tband trunk flex and ext Black tband trunk rotation 15 x each HS curl 20# 2 sets 12 LAQ red tband 2 sets 10 STS with wt ball press 10 x Wt ball OH ext and rotation 8 # lateral flexion 10 x each side    04/07/23 Walk outside 5 min 45 sec. At 3 min pace slowed, at 4 min she requested we turn around d/t fatigue-SOB noted Nustep L 5 25# rows 2 x 12 20# lats  2 x 12 Black tband trunk flex  and ext Black tband trunk rotation 15 x each STS with wt ball chest press 10 x then OH press 10 x Red tband LAQ 2 sets 10 Green tband HS curl 2 sets 10  04/03/23 Nustep L 5 10 min 25# leg curls 2x15 5# Leg extension 2x10 25# rows 2 x 12 20# lats  2 x 12 Black tband trunk flex and ext Black tband trunk rotation 15 x each Tricep, Bicep and upright row Pt stated pain on left side when lying on RT side- theragun and stretching performed Discussed and educ on walking pad for home and using walker for stability  03/12/23 Nustep L 6 10 min 25# leg curls 2x15 5# Leg extension 2x10 25# rows 2 x 12 20# lats  2 x 12 Black tband trunk flex and ext Black tband trunk rotation 15 x each Tricep, Bicep and upright row  03/05/23 Nustep level 5 x 10 minutes 10# straight arm pulls with cues for posture and core activation 25# leg curls 2x15 5# Leg extension 2x10 25# rows 20# lats Black tband triceps 5# biceps Worked with her and problem solved getting up and down from the floor, we practiced this and worked on a few things to hlep her as she is going to try to put a small pool on her deck and she will need to get up and down  02/24/23 Nustep L 5 10 min Black tband trunk flex and ext 20x 20# Rows 2 sets 10 20# lats 2 sets 10 STS wt ball press 10 x LAQ 2 sets 10 HS curls 25# 2 sets 10 Standing bicep curl into ext 10x Standing upright row into ext 10x Dyna disc pelvic ROM  PATIENT EDUCATION:  Education details: POC Person educated: Patient Education method: Explanation Education comprehension: verbalized understanding  HOME EXERCISE PROGRAM: TBD  ASSESSMENT:  CLINICAL IMPRESSION: Started the education process regarding going to the gym and being safe, started education on form, reps and weights, will need more education on the set up of the machines had some difficulty with the Nustep, we did it 3 x and she did much better as she went   OBJECTIVE IMPAIRMENTS: Abnormal gait,  cardiopulmonary status limiting activity, decreased activity tolerance, decreased balance, decreased coordination, decreased endurance, decreased mobility, difficulty walking, decreased ROM, decreased strength, increased fascial restrictions, impaired perceived functional ability, increased muscle spasms, impaired flexibility, improper body mechanics, and pain.   REHAB POTENTIAL: Good  CLINICAL DECISION MAKING: Stable/uncomplicated  EVALUATION COMPLEXITY: Low   GOALS: Goals reviewed with patient? Yes  SHORT TERM GOALS: Target date: 11/24/22  Independent with initial HEP Goal status: 11/27/22 MET  LONG TERM GOALS: Target date: 02/11/23  Independent iwht posture and body mechanics Goal status:met 01/01/23  2.  Walk 500 feet in a 3 minute walk test Goal status:ongoing 02/09/23   02/24/23 progressing  03/12/23 MET  3.  Decrease pain 25% Goal status: met 03/05/23  4.  Be able to cook a meal without pain > 5/10 Goal status: progressing 02/09/23  MET 03/12/23  5.  Independent with HEP or gym Goal status: progressing 01/07/23  progressing 02/24/23  Progressing 03/12/23 04/03/23 progressing  PLAN:  PT FREQUENCY: 1-2x/week  PT DURATION: 12 weeks  PLANNED INTERVENTIONS: Therapeutic exercises, Therapeutic activity, Neuromuscular re-education, Balance training, Gait training, Patient/Family education, Self Care, Joint mobilization, Dry Needling, Electrical stimulation, Cryotherapy, Moist heat, and Manual therapy.  PLAN FOR NEXT SESSION: Go over the education regarding the machine set up, form etc... answer questions and d/c     Delene Morais W, PT 04/13/2023, 1:16 PM

## 2023-04-14 ENCOUNTER — Ambulatory Visit: Payer: Medicare PPO | Admitting: Physical Therapy

## 2023-04-14 ENCOUNTER — Encounter: Payer: Self-pay | Admitting: Physical Therapy

## 2023-04-14 DIAGNOSIS — Z6835 Body mass index (BMI) 35.0-35.9, adult: Secondary | ICD-10-CM | POA: Diagnosis not present

## 2023-04-14 DIAGNOSIS — E1169 Type 2 diabetes mellitus with other specified complication: Secondary | ICD-10-CM | POA: Diagnosis not present

## 2023-04-14 DIAGNOSIS — M5459 Other low back pain: Secondary | ICD-10-CM | POA: Diagnosis not present

## 2023-04-14 DIAGNOSIS — R0789 Other chest pain: Secondary | ICD-10-CM | POA: Diagnosis not present

## 2023-04-14 DIAGNOSIS — M25561 Pain in right knee: Secondary | ICD-10-CM | POA: Diagnosis not present

## 2023-04-14 DIAGNOSIS — M62830 Muscle spasm of back: Secondary | ICD-10-CM

## 2023-04-14 DIAGNOSIS — R262 Difficulty in walking, not elsewhere classified: Secondary | ICD-10-CM | POA: Diagnosis not present

## 2023-04-14 NOTE — Progress Notes (Signed)
[facility] Summary Report / Loop Recorder 

## 2023-04-14 NOTE — Therapy (Signed)
OUTPATIENT PHYSICAL THERAPY THORACOLUMBAR TREATMENT    Patient Name: Jenna Mcdonald MRN: [id number] DOB:1945-09-08, 77 y.o., female Today's Date: 04/14/2023  END OF SESSION:  PT End of Session - 04/14/23 0920     Visit Number 22    Date for PT Re-Evaluation 04/14/23    Authorization Type [employer]    PT Start Time 0919    PT Stop Time 1010    PT Time Calculation (min) 51 min    Activity Tolerance Patient tolerated treatment well    Behavior During Therapy [facility] for tasks assessed/performed             Past Medical History:  Diagnosis Date   Anemia    Aortic stenosis    mild by echo 09/2020 and 08/2022 with mean AVG   Breast CA (HCC)    left   Breast cancer (HCC)    Carotid stenosis    1-39% left   Degenerative disc disease, lumbar    of the knees   Diabetes mellitus    Dysrhythmia    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HCC)    Encounter for therapeutic drug monitoring 08/17/2018   Fatigue    Severe secondary to to gemfibrozil   Gastroesophageal reflux disease    denies   GI bleeding    a. 06/2018: EGD showed multiple nonbleeding duodenal ulcers. 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   Hammertoe    left second toe   Hypercholesteremia    Hypertension    Insomnia    Irritable bowel    Low back pain    Metabolic syndrome    Morbid obesity (HCC)    OSA on CPAP \   bipap   Osteoarthritis    Osteopenia    Persistent atrial fibrillation [facility])    s/p afib ablation - Dr. Johney Frame 08/2018   Personal history of radiation therapy    Pneumonia    PONV (postoperative nausea and vomiting)    Sigmoid diverticulosis    Thalassemia minor    Unstable angina (HCC) 10/23/2020   Vitamin D deficiency    Past Surgical History:  Procedure Laterality Date   ATRIAL FIBRILLATION ABLATION N/A 09/21/2018   Procedure: ATRIAL FIBRILLATION ABLATION;  Surgeon: Hillis Range, MD;  Location: [facility] INVASIVE CV LAB;  Service:  Cardiovascular;  Laterality: N/A;   BIOPSY  07/06/2018   Procedure: BIOPSY;  Surgeon: Vida Rigger, MD;  Location: [facility] ENDOSCOPY;  Service: Endoscopy;;   BREAST LUMPECTOMY Left 05/28/2009   BREAST SURGERY Left 2010   lumpectomy   CARDIOVERSION N/A 07/07/2018   Procedure: CARDIOVERSION;  Surgeon: Chilton Si, MD;  Location: [facility] ENDOSCOPY;  Service: Cardiovascular;  Laterality: N/A;   CARDIOVERSION N/A 07/14/2018   Procedure: CARDIOVERSION;  Surgeon: Laurey Morale, MD;  Location: [facility] ENDOSCOPY;  Service: Cardiovascular;  Laterality: N/A;   CESAREAN SECTION     COLONOSCOPY WITH PROPOFOL N/A 07/06/2018   Procedure: COLONOSCOPY WITH PROPOFOL;  Surgeon: Vida Rigger, MD;  Location: [facility] ENDOSCOPY;  Service: Endoscopy;  Laterality: N/A;   CYSTECTOMY  1970   pilonidal    DILATION AND CURETTAGE OF UTERUS     ESOPHAGOGASTRODUODENOSCOPY (EGD) WITH PROPOFOL N/A 07/06/2018   Procedure: ESOPHAGOGASTRODUODENOSCOPY (EGD) WITH PROPOFOL;  Surgeon: Vida Rigger, MD;  Location: [facility] ENDOSCOPY;  Service: Endoscopy;  Laterality: N/A;   EYE SURGERY Bilateral 07/2013   cataracts   HOT HEMOSTASIS N/A 07/06/2018   Procedure: HOT HEMOSTASIS (ARGON PLASMA COAGULATION/BICAP);  Surgeon: Vida Rigger, MD;  Location: [facility] ENDOSCOPY;  Service: Endoscopy;  Laterality: N/A;   implantable loop recorder placement  01/15/2022   Med[employer]onic Reveal Linq model [id number] implantable loop recorder (SN [id number]) implanted for AF management by Dr Johney Frame   MAXIMUM ACCESS (MAS)POSTERIOR LUMBAR INTERBODY FUSION (PLIF) 2 LEVEL N/A 09/22/2013   Procedure: FOR MAXIMUM ACCESS  POSTERIOR LUMBAR INTERBODY FUSION LUMBAR THREE-FOUR FOUR-FIVE;  Surgeon: Tia Alert, MD;  Location: [facility] NEURO ORS;  Service: Neurosurgery;  Laterality: N/A;   OOPHORECTOMY     wedge section not rem   REPLACEMENT TOTAL KNEE Left 2011   REPLACEMENT TOTAL KNEE     RIGHT/LEFT HEART CATH AND CORONARY ANGIOGRAPHY N/A 10/26/2020   Procedure: RIGHT/LEFT HEART CATH AND CORONARY  ANGIOGRAPHY;  Surgeon: Marykay Lex, MD;  Location: [facility] INVASIVE CV LAB;  Service: Cardiovascular;  Laterality: N/A;   TEE WITHOUT CARDIOVERSION N/A 07/07/2018   Procedure: TRANSESOPHAGEAL ECHOCARDIOGRAM (TEE);  Surgeon: Chilton Si, MD;  Location: [facility] ENDOSCOPY;  Service: Cardiovascular;  Laterality: N/A;   TEE WITHOUT CARDIOVERSION N/A 09/21/2018   Procedure: TRANSESOPHAGEAL ECHOCARDIOGRAM (TEE);  Surgeon: Elease Hashimoto Deloris Ping, MD;  Location: [facility] ENDOSCOPY;  Service: Cardiovascular;  Laterality: N/A;   TONSILLECTOMY     TUBAL LIGATION     Patient Active Problem List   Diagnosis Date Noted   Shortness of breath    Unstable angina (HCC) 10/23/2020   Hypercoagulable state due to paroxysmal atrial fibrillation (HCC) 10/15/2020   Encounter for therapeutic drug monitoring 08/17/2018   CKD (chronic kidney disease) stage 3, GFR 30-59 ml/min (HCC) 07/21/2018   Chronic anemia 07/21/2018   Anemia    Heme positive stool    Atrial fibrillation with RVR (HCC) 07/04/2018   Paroxysmal atrial fibrillation (HCC) 07/02/2018   Carotid stenosis    Bruit of left carotid artery 09/22/2017   Aortic stenosis    Heart murmur 09/09/2016   Morbid obesity (HCC) 02/05/2016   Dry eye 05/17/2015   Type 2 diabetes mellitus without complication (HCC) 08/10/2014   Breast cancer, left breast (HCC) 07/22/2014   Obesity, Class III, BMI 40-49.9 (morbid obesity) (HCC) 07/06/2014   Macular pseudohole 05/17/2014   History of surgical procedure 05/17/2014   OSA (obstructive sleep apnea) 11/17/2013   S/P lumbar spinal fusion 09/22/2013   Persistent atrial fibrillation (HCC) 09/11/2011    Class: Acute   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HCC) 09/11/2011    Class: Chronic   Essential hypertension 09/11/2011   Exogenous obesity 09/11/2011   Gastroesophageal reflux disease 09/11/2011   Thalassemia minor 09/11/2011    Class: Chronic   Breast cancer (HCC) 09/11/2011    Class: Chronic   Degenerative joint disease 09/11/2011     Class: Chronic   Irritable bowel disease 09/11/2011    Class: Chronic   Lumbar disc disease 09/11/2011    PCP: Margaretann Loveless, MD  REFERRING PROVIDER: Baird Lyons, DO  REFERRING DIAG: chronic bag and knee pain  Rationale for Evaluation and Treatment: Rehabilitation  THERAPY DIAG:  Other low back pain  Acute pain of right knee  Difficulty in walking, not elsewhere classified  Muscle spasm of back  ONSET DATE: 10/14/22  SUBJECTIVE:  SUBJECTIVE STATEMENT: reports that she had an EKG this morning, doing well  PERTINENT HISTORY:  See above  PAIN:  Are you having pain? RT LB 2/10. Knee pain  PRECAUTIONS: Other: lumbar fusion 2015  WEIGHT BEARING RESTRICTIONS: No  FALLS:  Has patient fallen in last 6 months? No  LIVING ENVIRONMENT: Lives with: lives with their family Lives in: House/apartment Stairs: Yes: Internal: 15 steps; can reach both Has following equipment at home: Single point cane and uses the cane for long distances grocery shopping  OCCUPATION: retired  PLOF: Independent and gardens, does Presenter, broadcasting, cooks and cleans  PATIENT GOALS: strengthen core and have less pain  NEXT MD VISIT: 11/18/22  OBJECTIVE:   DIAGNOSTIC FINDINGS:  No recent x-rays   PATIENT SURVEYS:  ODI = 70% disability  COGNITION: Overall cognitive status: Within functional limits for tasks assessed     SENSATION: WFL  MUSCLE LENGTH: Very tight HS, piriformis and quads  POSTURE: rounded shoulders and forward head  PALPATION: Tender and tight in the low back mms, tightness iin the rhomboids and the upper traps  LUMBAR ROM:   AROM eval 12/09/22  Flexion Decreased 50% 25%  Extension Decreased 100% Decreased 100%  Right lateral flexion Decreased 75% Decreased 75%  Left lateral flexion Decreased 75%  Decreased 75%  Right rotation    Left rotation     (Blank rows = not tested)  LOWER EXTREMITY ROM:     Active  Right eval Left eval  Hip flexion    Hip extension    Hip abduction    Hip adduction    Hip internal rotation    Hip external rotation    Knee flexion 10 5  Knee extension 95 100  Ankle dorsiflexion    Ankle plantarflexion    Ankle inversion    Ankle eversion     (Blank rows = not tested)  LOWER EXTREMITY MMT:    MMT Right eval Left eval  Hip flexion 4- 4-  Hip extension 4- 4-  Hip abduction 4- 4-  Hip adduction    Hip internal rotation    Hip external rotation    Knee flexion 3+ 4-  Knee extension 3+ 4-  Ankle dorsiflexion 4 4  Ankle plantarflexion    Ankle inversion    Ankle eversion     (Blank rows = not tested)  FUNCTIONAL TESTS:  3 minute walk test: 220 feet had to stop at 30 seconds 01/13/23 = 440 feet 6 minute walk test = 600 feet TUG = 16 seconds   GAIT: Distance walked: 220 feet Assistive device utilized: Single point cane Level of assistance: Complete Independence Comments: stopped due to back pain  TODAY'S TREATMENT:                                                                                                                              DATE:   04/14/23 Nustep level 5 x 10 minutes with her independently  setting up with minimal guidance Leg curls 20# 2x10 Biceps 5# Triceps 20# Black tband lumbar extension and abs Seated row 15# Lats 15# All exercises explained set up, use and form, weight and reps, tried to show her that many of the exercises have multiple machines and ways to perform  04/13/23 Nustep level 5 x 10 minutes Rows 15# Lats 15# Black tband lumbar extension and abs 20# triceps 5# biceps Education on all the above exercises , reps, weights and form  04/10/23 Nustep 10 min L 5 25# rows 2 x 12 20# lats  2 x 12 Black tband trunk flex and ext Black tband trunk rotation 15 x each HS curl 20# 2 sets  12 LAQ red tband 2 sets 10 STS with wt ball press 10 x Wt ball OH ext and rotation 8 # lateral flexion 10 x each side    04/07/23 Walk outside 5 min 45 sec. At 3 min pace slowed, at 4 min she requested we turn around d/t fatigue-SOB noted Nustep L 5 25# rows 2 x 12 20# lats  2 x 12 Black tband trunk flex and ext Black tband trunk rotation 15 x each STS with wt ball chest press 10 x then OH press 10 x Red tband LAQ 2 sets 10 Green tband HS curl 2 sets 10  04/03/23 Nustep L 5 10 min 25# leg curls 2x15 5# Leg extension 2x10 25# rows 2 x 12 20# lats  2 x 12 Black tband trunk flex and ext Black tband trunk rotation 15 x each Tricep, Bicep and upright row Pt stated pain on left side when lying on RT side- theragun and stretching performed Discussed and educ on walking pad for home and using walker for stability  03/12/23 Nustep L 6 10 min 25# leg curls 2x15 5# Leg extension 2x10 25# rows 2 x 12 20# lats  2 x 12 Black tband trunk flex and ext Black tband trunk rotation 15 x each Tricep, Bicep and upright row  03/05/23 Nustep level 5 x 10 minutes 10# straight arm pulls with cues for posture and core activation 25# leg curls 2x15 5# Leg extension 2x10 25# rows 20# lats Black tband triceps 5# biceps Worked with her and problem solved getting up and down from the floor, we practiced this and worked on a few things to hlep her as she is going to try to put a small pool on her deck and she will need to get up and down  02/24/23 Nustep L 5 10 min Black tband trunk flex and ext 20x 20# Rows 2 sets 10 20# lats 2 sets 10 STS wt ball press 10 x LAQ 2 sets 10 HS curls 25# 2 sets 10 Standing bicep curl into ext 10x Standing upright row into ext 10x Dyna disc pelvic ROM  PATIENT EDUCATION:  Education details: POC Person educated: Patient Education method: Explanation Education comprehension: verbalized understanding  HOME EXERCISE  PROGRAM: TBD  ASSESSMENT:  CLINICAL IMPRESSION: Continued and finished up the education on the gym set up, working on form and safety.  She needed just a few cues to perform, she does have good questions and we went over these.   OBJECTIVE IMPAIRMENTS: Abnormal gait, cardiopulmonary status limiting activity, decreased activity tolerance, decreased balance, decreased coordination, decreased endurance, decreased mobility, difficulty walking, decreased ROM, decreased strength, increased fascial restrictions, impaired perceived functional ability, increased muscle spasms, impaired flexibility, improper body mechanics, and pain.   REHAB POTENTIAL: Good  CLINICAL DECISION  MAKING: Stable/uncomplicated  EVALUATION COMPLEXITY: Low   GOALS: Goals reviewed with patient? Yes  SHORT TERM GOALS: Target date: 11/24/22  Independent with initial HEP Goal status: 11/27/22 MET  LONG TERM GOALS: Target date: 02/11/23  Independent iwht posture and body mechanics Goal status:met 01/01/23    2.  Walk 500 feet in a 3 minute walk test Goal status:ongoing 02/09/23   02/24/23 progressing  03/12/23 MET  3.  Decrease pain 25% Goal status: met 03/05/23  4.  Be able to cook a meal without pain > 5/10 Goal status: progressing 02/09/23  MET 03/12/23  5.  Independent with HEP or gym Goal status: met 04/14/23  PLAN:  PT FREQUENCY: 1-2x/week  PT DURATION: 12 weeks  PLANNED INTERVENTIONS: Therapeutic exercises, Therapeutic activity, Neuromuscular re-education, Balance training, Gait training, Patient/Family education, Self Care, Joint mobilization, Dry Needling, Electrical stimulation, Cryotherapy, Moist heat, and Manual therapy.  PLAN FOR NEXT SESSION: D/C goals met     Jearld Lesch, PT 04/14/2023, 9:21 AM

## 2023-04-24 ENCOUNTER — Other Ambulatory Visit (HOSPITAL_BASED_OUTPATIENT_CLINIC_OR_DEPARTMENT_OTHER): Payer: Self-pay

## 2023-04-25 ENCOUNTER — Ambulatory Visit
Admission: RE | Admit: 2023-04-25 | Discharge: 2023-04-25 | Disposition: A | Discharge status: Home / Self Care | Payer: Medicare PPO | Source: Ambulatory Visit | Attending: Internal Medicine | Admitting: Internal Medicine

## 2023-04-25 ENCOUNTER — Other Ambulatory Visit (HOSPITAL_COMMUNITY): Payer: Self-pay

## 2023-04-25 DIAGNOSIS — K760 Fatty (change of) liver, not elsewhere classified: Secondary | ICD-10-CM | POA: Diagnosis not present

## 2023-04-25 DIAGNOSIS — K828 Other specified diseases of gallbladder: Secondary | ICD-10-CM | POA: Diagnosis not present

## 2023-04-25 DIAGNOSIS — R161 Splenomegaly, not elsewhere classified: Secondary | ICD-10-CM | POA: Diagnosis not present

## 2023-04-25 DIAGNOSIS — K862 Cyst of pancreas: Secondary | ICD-10-CM | POA: Diagnosis not present

## 2023-04-25 MED ORDER — GADOPICLENOL 0.5 MMOL/ML IV SOLN
10.0000 mL | Freq: Once | INTRAVENOUS | Status: AC | PRN
  Administered 2023-04-25: 10 mL via INTRAVENOUS

## 2023-04-28 ENCOUNTER — Other Ambulatory Visit (HOSPITAL_BASED_OUTPATIENT_CLINIC_OR_DEPARTMENT_OTHER): Payer: Self-pay

## 2023-04-28 MED ORDER — MOUNJARO 5 MG/0.5ML ~~LOC~~ SOAJ
5.0000 mg | SUBCUTANEOUS | 3 refills | Status: DC
  Filled 2023-04-28: qty 2, 28d supply, fill #0
  Filled 2023-05-21: qty 2, 28d supply, fill #1
  Filled 2023-06-09: qty 2, 28d supply, fill #2
  Filled 2023-07-16 – 2023-08-12 (×7): qty 2, 28d supply, fill #3

## 2023-04-29 ENCOUNTER — Other Ambulatory Visit (HOSPITAL_BASED_OUTPATIENT_CLINIC_OR_DEPARTMENT_OTHER): Payer: Self-pay

## 2023-04-30 DIAGNOSIS — I480 Paroxysmal atrial fibrillation: Secondary | ICD-10-CM | POA: Diagnosis not present

## 2023-04-30 DIAGNOSIS — I5032 Chronic diastolic (congestive) heart failure: Secondary | ICD-10-CM | POA: Diagnosis not present

## 2023-04-30 DIAGNOSIS — Z23 Encounter for immunization: Secondary | ICD-10-CM | POA: Diagnosis not present

## 2023-04-30 DIAGNOSIS — N1831 Chronic kidney disease, stage 3a: Secondary | ICD-10-CM | POA: Diagnosis not present

## 2023-04-30 DIAGNOSIS — E1122 Type 2 diabetes mellitus with diabetic chronic kidney disease: Secondary | ICD-10-CM | POA: Diagnosis not present

## 2023-04-30 DIAGNOSIS — G4733 Obstructive sleep apnea (adult) (pediatric): Secondary | ICD-10-CM | POA: Diagnosis not present

## 2023-04-30 DIAGNOSIS — I700 Atherosclerosis of aorta: Secondary | ICD-10-CM | POA: Diagnosis not present

## 2023-04-30 DIAGNOSIS — E1169 Type 2 diabetes mellitus with other specified complication: Secondary | ICD-10-CM | POA: Diagnosis not present

## 2023-04-30 DIAGNOSIS — E559 Vitamin D deficiency, unspecified: Secondary | ICD-10-CM | POA: Diagnosis not present

## 2023-04-30 DIAGNOSIS — Z1331 Encounter for screening for depression: Secondary | ICD-10-CM | POA: Diagnosis not present

## 2023-04-30 DIAGNOSIS — Z0000 Encounter for general adult medical examination without abnormal findings: Secondary | ICD-10-CM | POA: Diagnosis not present

## 2023-04-30 DIAGNOSIS — I10 Essential (primary) hypertension: Secondary | ICD-10-CM | POA: Diagnosis not present

## 2023-05-04 ENCOUNTER — Ambulatory Visit: Payer: Medicare PPO

## 2023-05-04 ENCOUNTER — Ambulatory Visit (HOSPITAL_COMMUNITY): Payer: Medicare PPO | Admitting: Physician Assistant

## 2023-05-04 DIAGNOSIS — I480 Paroxysmal atrial fibrillation: Secondary | ICD-10-CM

## 2023-05-04 LAB — CUP PACEART REMOTE DEVICE CHECK
Date Time Interrogation Session: 20240906230550
Implantable Pulse Generator Implant Date: 20230524

## 2023-05-06 ENCOUNTER — Ambulatory Visit (HOSPITAL_COMMUNITY): Payer: Medicare PPO | Admitting: Physician Assistant

## 2023-05-13 ENCOUNTER — Ambulatory Visit (HOSPITAL_COMMUNITY)
Admission: RE | Admit: 2023-05-13 | Discharge: 2023-05-13 | Disposition: A | Discharge status: Home / Self Care | Payer: Medicare PPO | Source: Ambulatory Visit | Attending: Internal Medicine | Admitting: Internal Medicine

## 2023-05-13 ENCOUNTER — Encounter (HOSPITAL_COMMUNITY): Payer: Self-pay | Admitting: Physician Assistant

## 2023-05-13 VITALS — BP 116/60 | HR 75 | Ht 64.5 in | Wt 203.0 lb

## 2023-05-13 DIAGNOSIS — Z7901 Long term (current) use of anticoagulants: Secondary | ICD-10-CM | POA: Diagnosis not present

## 2023-05-13 DIAGNOSIS — D6869 Other thrombophilia: Secondary | ICD-10-CM | POA: Insufficient documentation

## 2023-05-13 DIAGNOSIS — I4819 Other persistent atrial fibrillation: Secondary | ICD-10-CM | POA: Diagnosis not present

## 2023-05-13 DIAGNOSIS — I10 Essential (primary) hypertension: Secondary | ICD-10-CM | POA: Diagnosis not present

## 2023-05-13 MED ORDER — RIVAROXABAN 15 MG PO TABS: 15.0000 mg | ORAL_TABLET | Freq: Every day | ORAL | 11 refills | Status: DC

## 2023-05-13 NOTE — Progress Notes (Signed)
Primary Care Physician: Thana Ates, MD Referring Physician: [facility] f/u EP: Dr. Nelly Laurence Cardiologist: Dr.Turner   Jenna Mcdonald is a 77 y.o. female with a h/o persistent afib that was  hospitalized 07/20/18  for failure of flecainide to maintain  SR. She was started on amiodarone. Dr. Johney Frame consulted and thought pt may be an ablation candidate.  She is on warfarin.   I last saw pt  10/19/18. She was s/p ablation x one month. She reports that she had done exceptionally well. She had not noted any afib. She was having however, some GI issues that she thought was from amiodarone and stopped drug.   I am now seeing pt, 10/10/20, at the request of Dr. Mayford Knife. She was having an echo yesterday and the echo tech noted irregular heart beat and reported she was in afib, but I feel it  was the premature beats , not afib. Ekg  today shows SR with frequent PVC's. She recently had a stress test and echo for reporting to Dr. Mayford Knife that she is short of breath walking to her mailbox with pressure in her chest for the last last several weeks. PC's were noted at time of stress test as well.  She reports that she has gained around 20 lbs over the last year. Her BP is very elevated today. She checks very rarely;y at home. Her stress test did not show any ischemia but EF was reported moderately decreased. Dr. Mayford Knife thought it was 2/2 to the PC's and wanted to see EF from echo, results  are pending from yesterday. She has OSA and is using CPAP.   Follow up in the AF clinic 10/15/20. Patient presented to ED on 10/12/20 with chest heaviness and dyspnea on exertion. ACS was ruled out but she did have evidence of vascular congestion on CXR. She refused Lasix and was discharged home. ECG showed SR with bigeminal PVCs. She continues to have dyspnea with exertion and orthopnea.   Follow up in the AF clinic 07/14/22. Patient reports that she has had pain on the left side of her chest and up into her shoulder where her  ILR is located. She states it started when the device was implanted. The ILR shows 0% afib burden. She is off anticoagulation. She as lost ~22 lbs over the past year.   F/U in Afib clinic, 01/27/23. She is s/p ED visit on 6/2 for Afib with RVR. She underwent successful cardioversion in the ED and was placed on Eliquis 5 mg BID. Patient has ILR and since Jan 2024 noted she has 0.7% Afib burden. She questions how much Afib she has had all this time because she was not alerted over the weekend regarding ILR notification. She has been compliant with Eliquis 5 mg BID since ED visit.  Follow up in the AF clinic 05/13/23. Patient reports that she has done well since her last visit. Her ILR has not shown any interim afib episodes. She does report that she frequently forgets to take her evening dose of Eliquis. No bleeding issues on anticoagulation.   Today, she denies symptoms of palpitations, PND, dizziness, presyncope, syncope, or neurologic sequela. The patient is tolerating medications without difficulties and is otherwise without complaint today.   Past Medical History:  Diagnosis Date   Anemia    Aortic stenosis    mild by echo 09/2020 and 08/2022 with mean AVG   Breast CA (HCC)    left   Breast cancer (HCC)  Carotid stenosis    1-39% left   Degenerative disc disease, lumbar    of the knees   Diabetes mellitus    Dysrhythmia    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HCC)    Encounter for therapeutic drug monitoring 08/17/2018   Fatigue    Severe secondary to to gemfibrozil   Gastroesophageal reflux disease    denies   GI bleeding    a. 06/2018: EGD showed multiple nonbleeding duodenal ulcers. 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   Hammertoe    left second toe   Hypercholesteremia    Hypertension    Insomnia    Irritable bowel    Low back pain    Metabolic syndrome    Morbid obesity (HCC)    OSA on CPAP \   bipap    Osteoarthritis    Osteopenia    Persistent atrial fibrillation (HCC)    s/p afib ablation - Dr. Johney Frame 08/2018   Personal history of radiation therapy    Pneumonia    PONV (postoperative nausea and vomiting)    Sigmoid diverticulosis    Thalassemia minor    Unstable angina (HCC) 10/23/2020   Vitamin D deficiency     Current Outpatient Medications  Medication Sig Dispense Refill   apixaban (ELIQUIS) 5 MG TABS tablet Take 1 tablet (5 mg total) by mouth 2 (two) times daily. 60 tablet 3   bismuth subsalicylate (PEPTO BISMOL) 262 MG chewable tablet Chew 262-524 mg by mouth 3 (three) times daily as needed for indigestion.     Calcium Carbonate-Vitamin D (CALCIUM 600 + D PO) Take 1 tablet by mouth daily.      Cholecalciferol (VITAMIN D3) 25 MCG (1000 UT) CAPS Take 1,000 Units by mouth every morning.     diltiazem (CARDIZEM CD) 120 MG 24 hr capsule TAKE 1 CAPSULE BY MOUTH EVERY DAY 90 capsule 2   diphenhydramine-acetaminophen (TYLENOL PM) 25-500 MG TABS tablet Take 2 tablets by mouth at bedtime as needed (sleep).     metFORMIN (GLUCOPHAGE-XR) 500 MG 24 hr tablet 1 tablet with evening meal Orally Once a day for 30 days     Multiple Vitamin (MULTIVITAMIN) tablet Take 1 tablet by mouth daily with breakfast.     ondansetron (ZOFRAN) 4 MG tablet Take 4 mg by mouth every 8 (eight) hours as needed for nausea or vomiting.     ONETOUCH ULTRA test strip 1 each by Other route daily.     tirzepatide [facility]) 5 MG/0.5ML Pen Inject 5 mg into the skin every 7 (seven) days. 2 mL 3   zolpidem (AMBIEN) 5 MG tablet Take 5 mg by mouth at bedtime as needed for sleep.     No current facility-administered medications for this encounter.    ROS- All systems are reviewed and negative except as per the HPI above  Physical Exam: Vitals:   05/13/23 1026  BP: 116/60  Pulse: 75  Weight: 92.1 kg  Height: 5' 4.5" (1.638 m)    Wt Readings from Last 3 Encounters:  05/13/23 92.1 kg  01/27/23 95.5 kg  09/22/22  99.2 kg    GEN: Well nourished, well developed in no acute distress NECK: No JVD; No carotid bruits CARDIAC: Regular rate and rhythm, no murmurs, rubs, gallops RESPIRATORY:  Clear to auscultation without rales, wheezing or rhonchi  ABDOMEN: Soft, non-tender, non-distended EXTREMITIES:  No edema; No deformity    EKG today demonstrates SR Vent. rate 75 BPM PR interval 156 ms QRS duration  74 ms QT/QTcB 330/368 ms   Echo- 10/09/20  1. Left ventricular ejection fraction, by estimation, is 70 to 75%. The  left ventricle has hyperdynamic function. The left ventricle has no  regional wall motion abnormalities. There is mild left ventricular  hypertrophy. Left ventricular diastolic parameters are consistent with Grade II diastolic dysfunction (pseudonormalization).   2. Right ventricular systolic function is normal. The right ventricular  size is normal. There is normal pulmonary artery systolic pressure.   3. Left atrial size was moderately dilated.   4. The mitral valve is normal in structure. Trivial mitral valve  regurgitation. No evidence of mitral stenosis.   5. The aortic valve is grossly normal. Aortic valve regurgitation is not  visualized. Mild aortic valve stenosis.    CHA2DS2-VASc Score = 5  The patient's score is based upon: CHF History: 0 HTN History: 1 Diabetes History: 1 Stroke History: 0 Vascular Disease History: 0 Age Score: 2 Gender Score: 1       ASSESSMENT AND PLAN: Persistent Atrial Fibrillation (ICD10:  I48.19) The patient's CHA2DS2-VASc score is 5, indicating a 7.2% annual risk of stroke.   S/p ablation 09/21/18 ILR shows 0.4% burden, no interim episodes We discussed changing her anticoagulation medication to once daily to help with missed doses. Will start Xarelto 15 mg daily (CrCl 47 mL/min)  Secondary Hypercoagulable State (ICD10:  D68.69) The patient is at significant risk for stroke/thromboembolism based upon her CHA2DS2-VASc Score of 5.   Continue Apixaban (Eliquis).   HTN Stable on current regimen   Follow up with Dr Mayford Knife as scheduled. Patient declined f/u in the AF clinic for now.    Jorja Loa PA-C Afib Clinic [facility] [address] [place], [place] [address] [phone]

## 2023-05-13 NOTE — Patient Instructions (Addendum)
Stop Eliquis   Start Xarelto 15mg  once a day with breakfast   Follow up with Dr Mayford Knife as scheduled.

## 2023-05-20 ENCOUNTER — Other Ambulatory Visit: Payer: Self-pay | Admitting: Internal Medicine

## 2023-05-20 DIAGNOSIS — Z1231 Encounter for screening mammogram for malignant neoplasm of breast: Secondary | ICD-10-CM

## 2023-05-21 ENCOUNTER — Other Ambulatory Visit (HOSPITAL_BASED_OUTPATIENT_CLINIC_OR_DEPARTMENT_OTHER): Payer: Self-pay

## 2023-05-21 ENCOUNTER — Telehealth (HOSPITAL_COMMUNITY): Payer: Self-pay | Admitting: *Deleted

## 2023-05-21 NOTE — Telephone Encounter (Signed)
Patient called in stating since starting xarelto she is having terrible leg cramps and some weakness in the legs as well as bruising.  Discussed with Jorja Loa PA will trial back on eliquis and see if symptoms subside if they then pt will go back to eliquis - if the symptoms do not subside off xarelto then pt will need to follow up with PCP. Pt will call with update.

## 2023-05-21 NOTE — Progress Notes (Signed)
[facility] Summary Report / Loop Recorder 

## 2023-05-26 DIAGNOSIS — E781 Pure hyperglyceridemia: Secondary | ICD-10-CM | POA: Diagnosis not present

## 2023-05-26 DIAGNOSIS — I700 Atherosclerosis of aorta: Secondary | ICD-10-CM | POA: Diagnosis not present

## 2023-05-26 DIAGNOSIS — Z6835 Body mass index (BMI) 35.0-35.9, adult: Secondary | ICD-10-CM | POA: Diagnosis not present

## 2023-05-26 DIAGNOSIS — E66812 Obesity, class 2: Secondary | ICD-10-CM | POA: Diagnosis not present

## 2023-05-26 DIAGNOSIS — E1169 Type 2 diabetes mellitus with other specified complication: Secondary | ICD-10-CM | POA: Diagnosis not present

## 2023-05-26 DIAGNOSIS — E65 Localized adiposity: Secondary | ICD-10-CM | POA: Diagnosis not present

## 2023-05-27 MED ORDER — APIXABAN 5 MG PO TABS: 5.0000 mg | ORAL_TABLET | Freq: Two times a day (BID) | ORAL | 3 refills | Status: DC

## 2023-05-27 NOTE — Telephone Encounter (Signed)
Pt symptoms improved once off xarelto. Pt will continue Eliquis BID. RX sent

## 2023-05-29 DIAGNOSIS — D225 Melanocytic nevi of trunk: Secondary | ICD-10-CM | POA: Diagnosis not present

## 2023-05-29 DIAGNOSIS — L918 Other hypertrophic disorders of the skin: Secondary | ICD-10-CM | POA: Diagnosis not present

## 2023-05-29 DIAGNOSIS — D485 Neoplasm of uncertain behavior of skin: Secondary | ICD-10-CM | POA: Diagnosis not present

## 2023-05-29 DIAGNOSIS — L821 Other seborrheic keratosis: Secondary | ICD-10-CM | POA: Diagnosis not present

## 2023-05-29 DIAGNOSIS — L820 Inflamed seborrheic keratosis: Secondary | ICD-10-CM | POA: Diagnosis not present

## 2023-06-04 DIAGNOSIS — G4733 Obstructive sleep apnea (adult) (pediatric): Secondary | ICD-10-CM | POA: Diagnosis not present

## 2023-06-08 ENCOUNTER — Ambulatory Visit (INDEPENDENT_AMBULATORY_CARE_PROVIDER_SITE_OTHER): Payer: Medicare PPO

## 2023-06-08 DIAGNOSIS — I4819 Other persistent atrial fibrillation: Secondary | ICD-10-CM | POA: Diagnosis not present

## 2023-06-08 LAB — CUP PACEART REMOTE DEVICE CHECK
Date Time Interrogation Session: 20241013231455
Implantable Pulse Generator Implant Date: 20230524

## 2023-06-09 ENCOUNTER — Other Ambulatory Visit (HOSPITAL_BASED_OUTPATIENT_CLINIC_OR_DEPARTMENT_OTHER): Payer: Self-pay

## 2023-06-24 NOTE — Progress Notes (Signed)
[facility] Summary Report / Loop Recorder 

## 2023-07-06 ENCOUNTER — Ambulatory Visit: Payer: Medicare PPO

## 2023-07-09 ENCOUNTER — Other Ambulatory Visit (HOSPITAL_BASED_OUTPATIENT_CLINIC_OR_DEPARTMENT_OTHER): Payer: Self-pay

## 2023-07-09 DIAGNOSIS — E1169 Type 2 diabetes mellitus with other specified complication: Secondary | ICD-10-CM | POA: Diagnosis not present

## 2023-07-09 DIAGNOSIS — Z6835 Body mass index (BMI) 35.0-35.9, adult: Secondary | ICD-10-CM | POA: Diagnosis not present

## 2023-07-09 DIAGNOSIS — E65 Localized adiposity: Secondary | ICD-10-CM | POA: Diagnosis not present

## 2023-07-09 DIAGNOSIS — E66812 Obesity, class 2: Secondary | ICD-10-CM | POA: Diagnosis not present

## 2023-07-09 DIAGNOSIS — Z1331 Encounter for screening for depression: Secondary | ICD-10-CM | POA: Diagnosis not present

## 2023-07-09 DIAGNOSIS — F411 Generalized anxiety disorder: Secondary | ICD-10-CM | POA: Diagnosis not present

## 2023-07-09 MED ORDER — MOUNJARO 7.5 MG/0.5ML ~~LOC~~ SOAJ
7.5000 mg | SUBCUTANEOUS | 1 refills | Status: DC
  Filled 2023-07-09: qty 6, 84d supply, fill #0

## 2023-07-13 ENCOUNTER — Ambulatory Visit (INDEPENDENT_AMBULATORY_CARE_PROVIDER_SITE_OTHER): Payer: Medicare PPO

## 2023-07-13 DIAGNOSIS — I4819 Other persistent atrial fibrillation: Secondary | ICD-10-CM

## 2023-07-13 LAB — CUP PACEART REMOTE DEVICE CHECK
Date Time Interrogation Session: 20241117232502
Implantable Pulse Generator Implant Date: 20230524

## 2023-07-16 ENCOUNTER — Other Ambulatory Visit (HOSPITAL_BASED_OUTPATIENT_CLINIC_OR_DEPARTMENT_OTHER): Payer: Self-pay

## 2023-07-17 ENCOUNTER — Other Ambulatory Visit (HOSPITAL_BASED_OUTPATIENT_CLINIC_OR_DEPARTMENT_OTHER): Payer: Self-pay

## 2023-07-18 ENCOUNTER — Other Ambulatory Visit (HOSPITAL_BASED_OUTPATIENT_CLINIC_OR_DEPARTMENT_OTHER): Payer: Self-pay

## 2023-07-20 ENCOUNTER — Other Ambulatory Visit (HOSPITAL_BASED_OUTPATIENT_CLINIC_OR_DEPARTMENT_OTHER): Payer: Self-pay

## 2023-07-20 ENCOUNTER — Ambulatory Visit
Admission: RE | Admit: 2023-07-20 | Discharge: 2023-07-20 | Disposition: A | Discharge status: Home / Self Care | Payer: Medicare PPO | Source: Ambulatory Visit | Attending: Internal Medicine | Admitting: Internal Medicine

## 2023-07-20 DIAGNOSIS — Z1231 Encounter for screening mammogram for malignant neoplasm of breast: Secondary | ICD-10-CM | POA: Diagnosis not present

## 2023-07-29 ENCOUNTER — Encounter (HOSPITAL_COMMUNITY): Payer: Self-pay

## 2023-07-30 ENCOUNTER — Other Ambulatory Visit (HOSPITAL_BASED_OUTPATIENT_CLINIC_OR_DEPARTMENT_OTHER): Payer: Self-pay

## 2023-07-31 ENCOUNTER — Other Ambulatory Visit (HOSPITAL_BASED_OUTPATIENT_CLINIC_OR_DEPARTMENT_OTHER): Payer: Self-pay

## 2023-08-03 ENCOUNTER — Other Ambulatory Visit (HOSPITAL_BASED_OUTPATIENT_CLINIC_OR_DEPARTMENT_OTHER): Payer: Self-pay

## 2023-08-07 NOTE — Progress Notes (Signed)
[facility] Summary Report / Loop Recorder 

## 2023-08-11 ENCOUNTER — Other Ambulatory Visit (HOSPITAL_BASED_OUTPATIENT_CLINIC_OR_DEPARTMENT_OTHER): Payer: Self-pay

## 2023-08-12 ENCOUNTER — Other Ambulatory Visit (HOSPITAL_BASED_OUTPATIENT_CLINIC_OR_DEPARTMENT_OTHER): Payer: Self-pay

## 2023-08-17 ENCOUNTER — Ambulatory Visit (INDEPENDENT_AMBULATORY_CARE_PROVIDER_SITE_OTHER): Payer: Medicare PPO

## 2023-08-17 DIAGNOSIS — I4819 Other persistent atrial fibrillation: Secondary | ICD-10-CM | POA: Diagnosis not present

## 2023-08-18 LAB — CUP PACEART REMOTE DEVICE CHECK
Date Time Interrogation Session: 20241222231258
Implantable Pulse Generator Implant Date: 20230524

## 2023-08-24 DIAGNOSIS — H35373 Puckering of macula, bilateral: Secondary | ICD-10-CM | POA: Diagnosis not present

## 2023-08-24 DIAGNOSIS — E119 Type 2 diabetes mellitus without complications: Secondary | ICD-10-CM | POA: Diagnosis not present

## 2023-08-24 DIAGNOSIS — H04123 Dry eye syndrome of bilateral lacrimal glands: Secondary | ICD-10-CM | POA: Diagnosis not present

## 2023-08-24 DIAGNOSIS — H35342 Macular cyst, hole, or pseudohole, left eye: Secondary | ICD-10-CM | POA: Diagnosis not present

## 2023-08-24 DIAGNOSIS — H43813 Vitreous degeneration, bilateral: Secondary | ICD-10-CM | POA: Diagnosis not present

## 2023-08-27 DIAGNOSIS — M1711 Unilateral primary osteoarthritis, right knee: Secondary | ICD-10-CM | POA: Diagnosis not present

## 2023-09-02 DIAGNOSIS — G4733 Obstructive sleep apnea (adult) (pediatric): Secondary | ICD-10-CM | POA: Diagnosis not present

## 2023-09-03 DIAGNOSIS — M1711 Unilateral primary osteoarthritis, right knee: Secondary | ICD-10-CM | POA: Diagnosis not present

## 2023-09-09 DIAGNOSIS — F411 Generalized anxiety disorder: Secondary | ICD-10-CM | POA: Diagnosis not present

## 2023-09-09 DIAGNOSIS — E781 Pure hyperglyceridemia: Secondary | ICD-10-CM | POA: Diagnosis not present

## 2023-09-09 DIAGNOSIS — Z6835 Body mass index (BMI) 35.0-35.9, adult: Secondary | ICD-10-CM | POA: Diagnosis not present

## 2023-09-09 DIAGNOSIS — E66812 Obesity, class 2: Secondary | ICD-10-CM | POA: Diagnosis not present

## 2023-09-09 DIAGNOSIS — E1169 Type 2 diabetes mellitus with other specified complication: Secondary | ICD-10-CM | POA: Diagnosis not present

## 2023-09-10 DIAGNOSIS — M1711 Unilateral primary osteoarthritis, right knee: Secondary | ICD-10-CM | POA: Diagnosis not present

## 2023-09-11 DIAGNOSIS — N1831 Chronic kidney disease, stage 3a: Secondary | ICD-10-CM | POA: Diagnosis not present

## 2023-09-11 DIAGNOSIS — G8929 Other chronic pain: Secondary | ICD-10-CM | POA: Diagnosis not present

## 2023-09-11 DIAGNOSIS — I480 Paroxysmal atrial fibrillation: Secondary | ICD-10-CM | POA: Diagnosis not present

## 2023-09-11 DIAGNOSIS — I5032 Chronic diastolic (congestive) heart failure: Secondary | ICD-10-CM | POA: Diagnosis not present

## 2023-09-11 DIAGNOSIS — R2689 Other abnormalities of gait and mobility: Secondary | ICD-10-CM | POA: Diagnosis not present

## 2023-09-11 DIAGNOSIS — M545 Low back pain, unspecified: Secondary | ICD-10-CM | POA: Diagnosis not present

## 2023-09-11 DIAGNOSIS — E1122 Type 2 diabetes mellitus with diabetic chronic kidney disease: Secondary | ICD-10-CM | POA: Diagnosis not present

## 2023-09-11 DIAGNOSIS — E1169 Type 2 diabetes mellitus with other specified complication: Secondary | ICD-10-CM | POA: Diagnosis not present

## 2023-09-14 ENCOUNTER — Ambulatory Visit (HOSPITAL_COMMUNITY): Payer: Medicare PPO | Attending: Cardiology

## 2023-09-14 DIAGNOSIS — I350 Nonrheumatic aortic (valve) stenosis: Secondary | ICD-10-CM | POA: Diagnosis not present

## 2023-09-14 LAB — ECHOCARDIOGRAM COMPLETE
AR max vel: 1.16 cm2
AV Area VTI: 1.17 cm2
AV Area mean vel: 1.21 cm2
AV Mean grad: 19.3 mm[Hg]
AV Peak grad: 37 mm[Hg]
Ao pk vel: 3.04 m/s
Area-P 1/2: 3.72 cm2
S' Lateral: 2.5 cm

## 2023-09-15 ENCOUNTER — Telehealth: Payer: Self-pay

## 2023-09-15 DIAGNOSIS — I350 Nonrheumatic aortic (valve) stenosis: Secondary | ICD-10-CM

## 2023-09-15 NOTE — Telephone Encounter (Signed)
-----   Message from Armanda Magic sent at 09/14/2023  2:02 PM EST ----- Repeat echo in 1 year for aortic stenosis

## 2023-09-15 NOTE — Telephone Encounter (Signed)
Call to patient to explain that echo showed normal heart function EF 55 to 60% with moderately thickened heart muscle and increased stiffness of the heart muscle normal for her age, moderate enlargement of the left atrium, with trivial leakage of the mitral valve and mild calcification of the aortic valve with moderate aortic valve stenosis mean aortic valve gradient 19.3 mmHg compared to study a year ago aortic stenosis has progressed from mild to moderate. Patient agrees to repeat echo in 1 year.

## 2023-09-16 ENCOUNTER — Other Ambulatory Visit (HOSPITAL_COMMUNITY): Payer: Medicare PPO

## 2023-09-21 ENCOUNTER — Ambulatory Visit: Payer: Medicare PPO

## 2023-09-21 DIAGNOSIS — I4819 Other persistent atrial fibrillation: Secondary | ICD-10-CM

## 2023-09-21 LAB — CUP PACEART REMOTE DEVICE CHECK
Date Time Interrogation Session: 20250126231610
Implantable Pulse Generator Implant Date: 20230524

## 2023-09-22 ENCOUNTER — Telehealth: Payer: Self-pay | Admitting: Cardiology

## 2023-09-22 ENCOUNTER — Ambulatory Visit: Payer: Medicare PPO | Attending: Cardiology | Admitting: Cardiology

## 2023-09-22 ENCOUNTER — Encounter: Payer: Self-pay | Admitting: Cardiology

## 2023-09-22 VITALS — BP 140/64 | HR 80 | Ht 64.0 in | Wt 205.8 lb

## 2023-09-22 DIAGNOSIS — G4733 Obstructive sleep apnea (adult) (pediatric): Secondary | ICD-10-CM

## 2023-09-22 DIAGNOSIS — I480 Paroxysmal atrial fibrillation: Secondary | ICD-10-CM | POA: Diagnosis not present

## 2023-09-22 DIAGNOSIS — I350 Nonrheumatic aortic (valve) stenosis: Secondary | ICD-10-CM | POA: Diagnosis not present

## 2023-09-22 DIAGNOSIS — I493 Ventricular premature depolarization: Secondary | ICD-10-CM | POA: Diagnosis not present

## 2023-09-22 DIAGNOSIS — E7800 Pure hypercholesterolemia, unspecified: Secondary | ICD-10-CM | POA: Diagnosis not present

## 2023-09-22 DIAGNOSIS — I2583 Coronary atherosclerosis due to lipid rich plaque: Secondary | ICD-10-CM

## 2023-09-22 DIAGNOSIS — I5032 Chronic diastolic (congestive) heart failure: Secondary | ICD-10-CM

## 2023-09-22 DIAGNOSIS — I2510 Atherosclerotic heart disease of native coronary artery without angina pectoris: Secondary | ICD-10-CM | POA: Diagnosis not present

## 2023-09-22 DIAGNOSIS — R0609 Other forms of dyspnea: Secondary | ICD-10-CM | POA: Diagnosis not present

## 2023-09-22 DIAGNOSIS — I10 Essential (primary) hypertension: Secondary | ICD-10-CM | POA: Diagnosis not present

## 2023-09-22 NOTE — Telephone Encounter (Signed)
Call to patient to clarify what medication she is referring to. She states she discussed an over the counter magnesium supplement with Dr. Mayford Knife for leg cramps and she can't remember which one Dr. Mayford Knife said was ok for her to take. Forwarded to Dr. Mayford Knife.

## 2023-09-22 NOTE — Progress Notes (Signed)
Cardiology Office Note:    Date:  09/22/2023   ID:  Jenna, Mcdonald Jan 22, 1946, MRN [id number]  PCP:  Thana Ates, MD  Cardiologist:  Armanda Magic, MD    Referring MD: Thana Ates, MD   Chief Complaint  Patient presents with   Coronary Artery Disease   Hypertension   Hyperlipidemia   Atrial Fibrillation   Sleep Apnea   Congestive Heart Failure   Aortic Stenosis    History of Present Illness:    Jenna Mcdonald is a 78 y.o. female with a hx of OSA on BiPAP, HTN, PAF s/p afib ablation 2020, mild AS by echo 08/2016 and morbid obesity.  2D echo 08/2018 showed normal LVF with mild AS and mild MR.   Jenna Mcdonald 10/04/2020 for DOE showed no ischemia and 2D echo showed normal heart function with diastolic dysfunction and moderate LAE with mild AS and stable from prior echo.   She was seen in afib clinic 10/10/2020 and was noted to have frequent PVCs which were noted at time of stress test and her 2D echo.  She also reported that she had gained over 20lbs in the past year.  She then presented to the ER 10/12/20 with complaints of chest pain and DOE and ruled out with normal hsTrop but did have vascular congestion on CXR but refused Lasix and was discharged home.  She was having bigeminal PVCs at that time. A Zio Patch was ordered to assess PVC load.  She was seen back in afib clinic on 10/15/20 and was complaining of abdominal bloating and was started on Lasix 40mg  daily for 3 days and then taper down to 20mg  daily.   She underwent right and left heart cath ffor CP and SOB on 10/26/20 showing normal coronary arteries with elevated right heart pressures and PHTN which was felt to be due to CHF, OSA and obesity.  She was having frequent PVCs which were felt to be contributing to her volume overload and was started on Lasix 40mg  daily.    Due to a PVC load of 22% on heart monitor, she was seen by Dr. 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 She had not tolerated BB or Verapamil in the past and Dr. Johney Frame said there was nothing else to offer. She is now followed by Dr. Nelly Laurence and has an ILR in place.  She is here today for followup and is doing well.  She denies any chest pain or pressure, SOB, DOE, PND, orthopnea, LE edema, dizziness, palpitations or syncope. She is compliant with her meds and is tolerating meds with no SE.    She is doing well with her PAP device and thinks that she has gotten used to it.  She tolerates the mask and feels the pressure is adequate.  Since going on PAP she feels rested in the am and has no significant daytime sleepiness.  She denies any significant mouth or nasal dryness or nasal congestion.  She does not think that he snores.    Past Medical History:  Diagnosis Date   Anemia    Aortic stenosis    mild by echo 09/2020 and 08/2022 with mean AVG   Breast CA (HCC)    left   Breast cancer (HCC)    Carotid stenosis    1-39% left   Degenerative disc disease, lumbar    of the knees   Diabetes mellitus    Dysrhythmia  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HCC)    Encounter for therapeutic drug monitoring 08/17/2018   Fatigue    Severe secondary to to gemfibrozil   Gastroesophageal reflux disease    denies   GI bleeding    a. 06/2018: EGD showed multiple nonbleeding duodenal ulcers. 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   Hammertoe    left second toe   Hypercholesteremia    Hypertension    Insomnia    Irritable bowel    Low back pain    Metabolic syndrome    Morbid obesity (HCC)    OSA on CPAP \   bipap   Osteoarthritis    Osteopenia    Persistent atrial fibrillation [facility])    s/p afib ablation - Dr. Johney Frame 08/2018   Personal history of radiation therapy    Pneumonia    PONV (postoperative nausea and vomiting)    Sigmoid diverticulosis    Thalassemia minor    Unstable angina (HCC) 10/23/2020   Vitamin D deficiency      Past Surgical History:  Procedure Laterality Date   ATRIAL FIBRILLATION ABLATION N/A 09/21/2018   Procedure: ATRIAL FIBRILLATION ABLATION;  Surgeon: Hillis Range, MD;  Location: [facility] INVASIVE CV LAB;  Service: Cardiovascular;  Laterality: N/A;   BIOPSY  07/06/2018   Procedure: BIOPSY;  Surgeon: Vida Rigger, MD;  Location: [facility] ENDOSCOPY;  Service: Endoscopy;;   BREAST LUMPECTOMY Left 05/28/2009   BREAST SURGERY Left 2010   lumpectomy   CARDIOVERSION N/A 07/07/2018   Procedure: CARDIOVERSION;  Surgeon: Chilton Si, MD;  Location: [facility] ENDOSCOPY;  Service: Cardiovascular;  Laterality: N/A;   CARDIOVERSION N/A 07/14/2018   Procedure: CARDIOVERSION;  Surgeon: Laurey Morale, MD;  Location: [facility] ENDOSCOPY;  Service: Cardiovascular;  Laterality: N/A;   CESAREAN SECTION     COLONOSCOPY WITH PROPOFOL N/A 07/06/2018   Procedure: COLONOSCOPY WITH PROPOFOL;  Surgeon: Vida Rigger, MD;  Location: [facility] ENDOSCOPY;  Service: Endoscopy;  Laterality: N/A;   CYSTECTOMY  1970   pilonidal    DILATION AND CURETTAGE OF UTERUS     ESOPHAGOGASTRODUODENOSCOPY (EGD) WITH PROPOFOL N/A 07/06/2018   Procedure: ESOPHAGOGASTRODUODENOSCOPY (EGD) WITH PROPOFOL;  Surgeon: Vida Rigger, MD;  Location: [facility] ENDOSCOPY;  Service: Endoscopy;  Laterality: N/A;   EYE SURGERY Bilateral 07/2013   cataracts   HOT HEMOSTASIS N/A 07/06/2018   Procedure: HOT HEMOSTASIS (ARGON PLASMA COAGULATION/BICAP);  Surgeon: Vida Rigger, MD;  Location: [facility] ENDOSCOPY;  Service: Endoscopy;  Laterality: N/A;   implantable loop recorder placement  01/15/2022   Medtronic Reveal Linq model [id number] implantable loop recorder (SN [id number]) implanted for AF management by Dr Johney Frame   MAXIMUM ACCESS (MAS)POSTERIOR LUMBAR INTERBODY FUSION (PLIF) 2 LEVEL N/A 09/22/2013   Procedure: FOR MAXIMUM ACCESS  POSTERIOR LUMBAR INTERBODY FUSION LUMBAR THREE-FOUR FOUR-FIVE;  Surgeon: Tia Alert, MD;  Location: [facility] NEURO ORS;  Service: Neurosurgery;  Laterality: N/A;    OOPHORECTOMY     wedge section not rem   REPLACEMENT TOTAL KNEE Left 2011   REPLACEMENT TOTAL KNEE     RIGHT/LEFT HEART CATH AND CORONARY ANGIOGRAPHY N/A 10/26/2020   Procedure: RIGHT/LEFT HEART CATH AND CORONARY ANGIOGRAPHY;  Surgeon: Marykay Lex, MD;  Location: [facility] INVASIVE CV LAB;  Service: Cardiovascular;  Laterality: N/A;   TEE WITHOUT CARDIOVERSION N/A 07/07/2018   Procedure: TRANSESOPHAGEAL ECHOCARDIOGRAM (TEE);  Surgeon: Chilton Si, MD;  Location: [facility] ENDOSCOPY;  Service: Cardiovascular;  Laterality: N/A;   TEE WITHOUT CARDIOVERSION N/A 09/21/2018   Procedure: TRANSESOPHAGEAL ECHOCARDIOGRAM (TEE);  Surgeon: Vesta Mixer, MD;  Location: [facility] ENDOSCOPY;  Service: Cardiovascular;  Laterality: N/A;   TONSILLECTOMY     TUBAL LIGATION      Current Medications: Current Meds  Medication Sig   apixaban (ELIQUIS) 5 MG TABS tablet Take 1 tablet (5 mg total) by mouth 2 (two) times daily.   Calcium Carbonate-Vitamin D (CALCIUM 600 + D PO) Take 1 tablet by mouth daily.    Cholecalciferol (VITAMIN D3) 25 MCG (1000 UT) CAPS Take 1,000 Units by mouth every morning.   diltiazem (CARDIZEM CD) 120 MG 24 hr capsule TAKE 1 CAPSULE BY MOUTH EVERY DAY   metFORMIN (GLUCOPHAGE-XR) 500 MG 24 hr tablet 1 tablet with evening meal Orally Once a day for 30 days   Multiple Vitamin (MULTIVITAMIN) tablet Take 1 tablet by mouth daily with breakfast.   ONETOUCH ULTRA test strip 1 each by Other route daily.   tirzepatide (MOUNJARO) 7.5 MG/0.5ML Pen Inject 7.5 mg into the skin every 7 (seven) days.     Allergies:   Metoprolol, Amiodarone, Anastrozole, Calcitriol, Furosemide, Hydrocodone-acetaminophen, Jardiance [empagliflozin], Tamoxifen, Tetracyclines & related, Toprol xl [metoprolol tartrate], Verapamil, Vicodin [hydrocodone-acetaminophen], Aromasin [exemestane], Gemfibrozil, and Other   Social History   Socioeconomic History   Marital status: Widowed    Spouse name: Not on file   Number of  children: Not on file   Years of education: Not on file   Highest education level: Not on file  Occupational History   Not on file  Tobacco Use   Smoking status: Former    Current packs/day: 0.00    Average packs/day: 0.5 packs/day for 10.0 years (5.0 ttl pk-yrs)    Types: Cigarettes    Start date: 11/11/1993    Quit date: 11/12/2003    Years since quitting: 19.8   Smokeless tobacco: Never   Tobacco comments:    Former smoker 05/13/23  Vaping Use   Vaping status: Never Used  Substance and Sexual Activity   Alcohol use: Yes    Comment: occasionally   Drug use: No   Sexual activity: Not Currently    Birth control/protection: Post-menopausal  Other Topics Concern   Not on file  Social History Narrative   Not on file   Social Drivers of Health   Financial Resource Strain: Not on file  Food Insecurity: No Food Insecurity (02/17/2023)   Hunger Vital Sign    Worried About Running Out of Food in the Last Year: Never true    Ran Out of Food in the Last Year: Never true  Transportation Needs: No Transportation Needs (02/17/2023)   PRAPARE - Administrator, Civil Service (Medical): No    Lack of Transportation (Non-Medical): No  Physical Activity: Not on file  Stress: Not on file  Social Connections: Not on file     Family History: The patient's family history includes Anesthesia problems in her mother; Diabetes in her father and mother.  ROS:   Please see the history of present illness.    ROS  All other systems reviewed and negative.   EKGs/Labs/Other Studies Reviewed:    The following studies were reviewed today: PAP compliance download  Freddie Breech 10/04/2020 Study Highlights    The left ventricular ejection fraction is moderately decreased (30-44%). Nuclear stress EF: 38%. There was no ST segment deviation noted during stress. The study is normal. This is a low risk study.   Normal pharmacologic nuclear stress test with no evidence for prior  infarct or ischemia. Very frequent  PACs and short runs of atrial tachycardia. LVEF appears underestimated, correlation with an echocardiogram is recommended.  2D echo 10/09/2020 IMPRESSIONS   1. Left ventricular ejection fraction, by estimation, is 70 to 75%. The  left ventricle has hyperdynamic function. The left ventricle has no  regional wall motion abnormalities. There is mild left ventricular  hypertrophy. Left ventricular diastolic  parameters are consistent with Grade II diastolic dysfunction  (pseudonormalization).   2. Right ventricular systolic function is normal. The right ventricular  size is normal. There is normal pulmonary artery systolic pressure.   3. Left atrial size was moderately dilated.   4. The mitral valve is normal in structure. Trivial mitral valve  regurgitation. No evidence of mitral stenosis.   5. The aortic valve is grossly normal. Aortic valve regurgitation is not  visualized. Mild aortic valve stenosis.     Recent Labs: 01/25/2023: ALT 18; BUN 27; Creatinine, Ser 1.47; Hemoglobin 11.2; Magnesium 2.0; Platelets 180; Potassium 4.3; Sodium 142; TSH 1.706   Recent Lipid Panel    Component Value Date/Time   CHOL 132 09/16/2021 0904   TRIG 210 (H) 09/16/2021 0904   HDL 46 09/16/2021 0904   CHOLHDL 2.9 09/16/2021 0904   CHOLHDL 4.2 10/24/2020 0344   VLDL 46 (H) 10/24/2020 0344   LDLCALC 52 09/16/2021 0904    Physical Exam:    VS:  BP (!) 140/64 (BP Location: Right Arm, Patient Position: Sitting, Cuff Size: Large)   Pulse 80   Ht 5\' 4"  (1.626 m)   Wt 205 lb 12.8 oz (93.4 kg)   SpO2 98%   BMI 35.33 kg/m     Wt Readings from Last 3 Encounters:  09/22/23 205 lb 12.8 oz (93.4 kg)  05/13/23 203 lb (92.1 kg)  01/27/23 210 lb 9.6 oz (95.5 kg)    GEN: Well nourished, well developed in no acute distress HEENT: Normal NECK: No JVD; bilateral bruits that radiate from the AV LYMPHATICS: No lymphadenopathy CARDIAC:RRR, no rubs, gallops 2/6 mid peaking SM  at RUSB to LUSB and radiates into the cartodis RESPIRATORY:  Clear to auscultation without rales, wheezing or rhonchi  ABDOMEN: Soft, non-tender, non-distended MUSCULOSKELETAL:  No edema; No deformity  SKIN: Warm and dry NEUROLOGIC:  Alert and oriented x 3 PSYCHIATRIC:  Normal affect  ASSESSMENT:    1. Nonrheumatic aortic valve stenosis   2. Essential hypertension   3. Paroxysmal atrial fibrillation (HCC)   4. Coronary artery disease due to lipid rich plaque   5. PVC (premature ventricular contraction)   6. DOE (dyspnea on exertion)   7. Chronic diastolic CHF (congestive heart failure) (HCC)   8. Pure hypercholesterolemia   9. OSA (obstructive sleep apnea)     PLAN:    In order of problems listed above:  Aortic stenosis -2D echo 08/2018 showed very mild AS with mean AVG . -2D echo 09/14/2023 showed EF 55 to 60% with moderate aortic valve stenosis V-max 3.04 m/s, mean aortic valve gradient 19.3 and DVI 0.37 -Repeat 2D echo in 1 year  HTN -BP controlled on exam today -Continue prescription drug management with Cardizem CD 120 mg daily with as needed refills   PAF -s/p afib ablation 08/2018 -She is maintaining normal sinus rhythm on exam and denies any palpitations -Denies any bleeding problems on DOAC -Continue prescription drug management with apixaban 5 mg twice daily, Cardizem CD 120 mg daily with PRN Refills -I have personally reviewed and interpreted outside labs performed by patient's PCP which showed serum creatinine 1.47 on  01/25/2023, hemoglobin 10.2 on 04/14/2023 and potassium 4.5 on 09/11/2023  ASCAD -coronary CTA 08/2018 showing 25-49% LAD stenosis with cor ca score 15 but no CAD on cardiac cath 3/22 Jenna Mcdonald 09/2020 showed no ischemia -Denies any anginal symptoms  PVCs -noted on stress test, echo and EKG -ziopatch showed 22% PVC load -seen by Ep and felt to be of outflow tract origin and EP felt she was not really symptomatic -she has not tolerated BB  or Verapamil in the past and was kept on Cardizem -Continue prescription drug managed with Cardizem CD 120 mg daily  DOE -suspect this is multifactorial from obesity, sedentary state, PVCs, chronic diastolic CHF -she had elevated filling pressures on cath and restarted on lasix -PHTN noted and multifactorial from OSA, obesity, CHF -She appears euvolemic on exam today -Continue Lasix as needed  Chronic diastolic CHF -she appears euvolemic on exam today -She is only taking Lasix on a as needed basis -she did not tolerate Jardiance  HLD -LDL goal < 70 -I have personally reviewed and interpreted outside PCP performed by patient's PCP which showed LDL 87 and HDL 42 on 04/30/2023 and ALT 12 on 04/14/2023 -She is statin intolerant -refer to lipid clinic  OSA - The patient is tolerating PAP therapy well without any problems. The PAP download performed by his DME was personally reviewed and interpreted by me today and showed an AHI of 2.5 /hr on 12/8 cm H2O with 80 % compliance in using more than 4 hours nightly.  The patient has been using and benefiting from PAP use and will continue to benefit from therapy.     Followup with me in 1 year  Medication Adjustments/Labs and Tests Ordered: Current medicines are reviewed at length with the patient today.  Concerns regarding medicines are outlined above.  No orders of the defined types were placed in this encounter.  No orders of the defined types were placed in this encounter.   Signed, Armanda Magic, MD  09/22/2023 10:01 AM     [facility]

## 2023-09-22 NOTE — Addendum Note (Signed)
Addended by: Luellen Pucker on: 09/22/2023 10:18 AM   Modules accepted: Orders

## 2023-09-22 NOTE — Telephone Encounter (Signed)
New Message:       Patient  said she was here earlier today.. She said Dr Mayford Knife was prescribing a new med for her, but forgot to give it to her .

## 2023-09-22 NOTE — Patient Instructions (Signed)
Medication Instructions:  Your physician recommends that you continue on your current medications as directed. Please refer to the Current Medication list given to you today.  *If you need a refill on your cardiac medications before your next appointment, please call your pharmacy*   Lab Work: None.  If you have labs (blood work) drawn today and your tests are completely normal, you will receive your results only by: MyChart Message (if you have MyChart) OR A paper copy in the mail If you have any lab test that is abnormal or we need to change your treatment, we will call you to review the results.   Testing/Procedures: Your physician has requested that you have an echocardiogram in January 2026. Echocardiography is a painless test that uses sound waves to create images of your heart. It provides your doctor with information about the size and shape of your heart and how well your heart's chambers and valves are working. This procedure takes approximately one hour. There are no restrictions for this procedure. Please do NOT wear cologne, perfume, aftershave, or lotions (deodorant is allowed). Please arrive 15 minutes prior to your appointment time.  Please note: We ask at that you not bring children with you during ultrasound (echo/ vascular) testing. Due to room size and safety concerns, children are not allowed in the ultrasound rooms during exams. Our front office staff cannot provide observation of children in our lobby area while testing is being conducted. An adult accompanying a patient to their appointment will only be allowed in the ultrasound room at the discretion of the ultrasound technician under special circumstances. We apologize for any inconvenience.    Follow-Up:  Your next appointment:   1 year(s)  Provider:   Armanda Magic, MD     Other Instructions Dr. Mayford Knife has referred you to our lipid clinic. This is run by our pharmacists who have extensive training in  cholesterol medications. They will try to find you a cost-efficient regimen that successfully reduces your cholesterol with minimal side effects. Someone will call you to schedule.

## 2023-09-23 ENCOUNTER — Telehealth: Payer: Self-pay | Admitting: Cardiology

## 2023-09-23 NOTE — Telephone Encounter (Signed)
Patient is calling to follow up on which Magnesium supplement she should take. Please advise.

## 2023-09-23 NOTE — Telephone Encounter (Signed)
Pt aware Dr. Mayford Knife recommends starting Magnesium Glycinate. Pt would like to know how much to take. Aware forwarding to provider for advisement and will let her know. Pt agreeable to plan.

## 2023-09-24 MED ORDER — MAGNESIUM GLYCINATE 100 MG PO CAPS: 200.0000 mg | ORAL_CAPSULE | Freq: Every evening | ORAL | Status: AC | PRN

## 2023-09-24 NOTE — Telephone Encounter (Signed)
Called patient back with Dr. Norris Cross recommendations.  Quintella Reichert, MD    09/24/23 10:54 AM Can start with 200 mg nightly as needed and can increase to 400 mg if needed.

## 2023-09-24 NOTE — Telephone Encounter (Signed)
Called patient with Dr. Norris Cross message. Patient would like the actual milligrams. Looked up different brands, and found there are 100 mg, 200 mg, and 400 mg tablets. Will forward to Dr. Mayford Knife for dosage.

## 2023-09-25 ENCOUNTER — Encounter: Payer: Self-pay | Admitting: Cardiovascular Disease

## 2023-09-25 ENCOUNTER — Ambulatory Visit: Payer: Medicare PPO | Attending: Cardiology | Admitting: Pharmacist

## 2023-09-25 DIAGNOSIS — E7800 Pure hypercholesterolemia, unspecified: Secondary | ICD-10-CM | POA: Diagnosis not present

## 2023-09-25 DIAGNOSIS — E785 Hyperlipidemia, unspecified: Secondary | ICD-10-CM | POA: Insufficient documentation

## 2023-09-25 MED ORDER — PRAVASTATIN SODIUM 20 MG PO TABS: 20.0000 mg | ORAL_TABLET | Freq: Every evening | ORAL | 3 refills | Status: DC

## 2023-09-25 MED ORDER — PRAVASTATIN SODIUM 20 MG PO TABS: 20.0000 mg | ORAL_TABLET | Freq: Every evening | ORAL | 11 refills | Status: DC

## 2023-09-25 NOTE — Progress Notes (Unsigned)
Patient ID: Xoe Hoe                 DOB: Jun 23, 1946                    MRN: [id number]      HPI: Jenna Mcdonald is a 78 y.o. female patient referred to lipid clinic by Dr. Mayford Knife. PMH is significant for OSA, CHF, CAD (CTA 08/2018 showing 25-49% LAD stenosis with cor ca score 15 but no CAD on cardiac cath 3/22), A-fib, aortic stenosis, DM (A1C 09/11/23 6.8)  Patient presents today to lipid clinic.  Currently not on any cholesterol medication.  She states that she cannot tolerate statins but does not really recall what statin she is taking.  Per the chart it looks like she was on rosuvastatin very low-dose the patient does not really recall this.  Her triglycerides were elevated on last labs however these labs were not fasting per patient report.  We did discuss her ASCVD risk given her history of diabetes although well-controlled.  Cath in 2022 showed no significant coronary artery disease.  She does have a10 year risk of ASCVD 12% per the prevent risk calculator.   Patient concerned about her aortic valve.  Father had aortic valve replacement in his 60s and lived until 44.  We did talk about how statins can help delay the progression of aortic stenosis.  Current Medications: None Intolerances: Gemfibrozil (fatigue), rosuvastatin 5 mg weekly, rosuvastatin 5 mg 3 times a week Risk Factors: Diabetes LDL-C goal: <70 ApoB goal:   Diet: Per patient she eats a very healthy diet low in fat.  This was not discussed  Exercise: Not discussed in detail  Family History:  Family History  Problem Relation Age of Onset   Anesthesia problems Mother    Diabetes Mother    Diabetes Father      Social History:  Social History   Socioeconomic History   Marital status: Widowed    Spouse name: Not on file   Number of children: Not on file   Years of education: Not on file   Highest education level: Not on file  Occupational History   Not on file  Tobacco Use   Smoking  status: Former    Current packs/day: 0.00    Average packs/day: 0.5 packs/day for 10.0 years (5.0 ttl pk-yrs)    Types: Cigarettes    Start date: 11/11/1993    Quit date: 11/12/2003    Years since quitting: 19.8   Smokeless tobacco: Never   Tobacco comments:    Former smoker 05/13/23  Vaping Use   Vaping status: Never Used  Substance and Sexual Activity   Alcohol use: Yes    Comment: occasionally   Drug use: No   Sexual activity: Not Currently    Birth control/protection: Post-menopausal  Other Topics Concern   Not on file  Social History Narrative   Not on file   Social Drivers of Health   Financial Resource Strain: Not on file  Food Insecurity: No Food Insecurity (02/17/2023)   Hunger Vital Sign    Worried About Running Out of Food in the Last Year: Never true    Ran Out of Food in the Last Year: Never true  Transportation Needs: No Transportation Needs (02/17/2023)   PRAPARE - Administrator, Civil Service (Medical): No    Lack of Transportation (Non-Medical): No  Physical Activity: Not on file  Stress: Not on file  Social  Connections: Not on file  Intimate Partner Violence: Not At Risk (02/17/2023)   Humiliation, Afraid, Rape, and Kick questionnaire    Fear of Current or Ex-Partner: No    Emotionally Abused: No    Physically Abused: No    Sexually Abused: No     Labs: Lipid Panel  04/30/23: Total cholesterol 165, HDL 42, LDL-C 87, triglycerides 216     Component Value Date/Time   CHOL 132 09/16/2021 0904   TRIG 210 (H) 09/16/2021 0904   HDL 46 09/16/2021 0904   CHOLHDL 2.9 09/16/2021 0904   CHOLHDL 4.2 10/24/2020 0344   VLDL 46 (H) 10/24/2020 0344   LDLCALC 52 09/16/2021 0904   LABVLDL 34 09/16/2021 0904    Past Medical History:  Diagnosis Date   Anemia    Aortic stenosis    mild by echo 09/2020 and 08/2022 with mean AVG   Breast CA (HCC)    left   Breast cancer (HCC)    Carotid stenosis    1-39% left   Degenerative disc disease,  lumbar    of the knees   Diabetes mellitus    Dysrhythmia    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HCC)    Encounter for therapeutic drug monitoring 08/17/2018   Fatigue    Severe secondary to to gemfibrozil   Gastroesophageal reflux disease    denies   GI bleeding    a. 06/2018: EGD showed multiple nonbleeding duodenal ulcers. 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   Hammertoe    left second toe   Hypercholesteremia    Hypertension    Insomnia    Irritable bowel    Low back pain    Metabolic syndrome    Morbid obesity (HCC)    OSA on CPAP \   bipap   Osteoarthritis    Osteopenia    Persistent atrial fibrillation (HCC)    s/p afib ablation - Dr. Johney Frame 08/2018   Personal history of radiation therapy    Pneumonia    PONV (postoperative nausea and vomiting)    Sigmoid diverticulosis    Thalassemia minor    Unstable angina (HCC) 10/23/2020   Vitamin D deficiency     Current Outpatient Medications on File Prior to Visit  Medication Sig Dispense Refill   apixaban (ELIQUIS) 5 MG TABS tablet Take 1 tablet (5 mg total) by mouth 2 (two) times daily. 180 tablet 3   Calcium Carbonate-Vitamin D (CALCIUM 600 + D PO) Take 1 tablet by mouth daily.      Cholecalciferol (VITAMIN D3) 25 MCG (1000 UT) CAPS Take 1,000 Units by mouth every morning.     diltiazem (CARDIZEM CD) 120 MG 24 hr capsule TAKE 1 CAPSULE BY MOUTH EVERY DAY 90 capsule 2   Magnesium Glycinate 100 MG CAPS Take 200 mg by mouth at bedtime as needed (for muscle cramps). Can increase to 400 mg if needed.     metFORMIN (GLUCOPHAGE-XR) 500 MG 24 hr tablet 1 tablet with evening meal Orally Once a day for 30 days     Multiple Vitamin (MULTIVITAMIN) tablet Take 1 tablet by mouth daily with breakfast.     tirzepatide (MOUNJARO) 7.5 MG/0.5ML Pen Inject 7.5 mg into the skin every 7 (seven) days. 6 mL 1   bismuth subsalicylate (PEPTO BISMOL) 262 MG chewable tablet Chew 262-524 mg by  mouth 3 (three) times daily as needed for indigestion. (Patient not taking: Reported on 09/22/2023)     diphenhydramine-acetaminophen (TYLENOL PM) 25-500  MG TABS tablet Take 2 tablets by mouth at bedtime as needed (sleep). (Patient not taking: Reported on 09/22/2023)     ondansetron (ZOFRAN) 4 MG tablet Take 4 mg by mouth every 8 (eight) hours as needed for nausea or vomiting. (Patient not taking: Reported on 09/22/2023)     ONETOUCH ULTRA test strip 1 each by Other route daily.     zolpidem (AMBIEN) 5 MG tablet Take 5 mg by mouth at bedtime as needed for sleep. (Patient not taking: Reported on 09/22/2023)     No current facility-administered medications on file prior to visit.    Allergies  Allergen Reactions   Metoprolol Shortness Of Breath, Diarrhea and Other (See Comments)    Fatigue    Amiodarone Other (See Comments)    Reaction not recalled   Anastrozole Other (See Comments)    Fatigue   Calcitriol Other (See Comments)    Reaction not noted   Furosemide Other (See Comments)    Cramps   Hydrocodone-Acetaminophen Nausea And Vomiting and Other (See Comments)    Patient reports terrible nausea/GI upset; plain Tylenol is ok with patient   Jardiance [Empagliflozin] Other (See Comments)    Severe muscle and bone pain   Tamoxifen Other (See Comments)    Bone and body aches    Tetracyclines & Related Other (See Comments)    Vaginal infections     Toprol Xl [Metoprolol Tartrate] Other (See Comments)    Hair loss   Verapamil Other (See Comments)    Unknown   Vicodin [Hydrocodone-Acetaminophen] Nausea And Vomiting and Other (See Comments)    Patient reports terrible nausea; plain tylenol is ok with patient   Aromasin [Exemestane] Other (See Comments)    Hair loss    Gemfibrozil Other (See Comments)    Severe fatigue   Other Other (See Comments)    Numerous cancer drugs - hair loss and/or could not tolerate    Assessment/Plan:  1. Hyperlipidemia -   Hyperlipidemia Assessment: LDL-C is above goal less than 70 Discussed ASCVD risk score of 12% (intermediate risk) Patient concerned about aortic stenosis, we discussed statins role in helping reduce this or slow the progression Discussed potential side effects of statin medications versus benefit Patient with leg cramps even not on statin medications.  She does take 2 tablespoons of mustard which helps significantly  Plan: Patient agreeable to trying pravastatin 20 mg daily Labs in 2 to 3 months (reminded her to fast for these labs) Patient to call with any tolerability issues.    Thank you,  Olene Floss, Pharm.D, BCACP, CPP [facility] A Division of [facility] [address] [place], [place] [address]  Phone: [phone]; Fax: [phone]

## 2023-09-25 NOTE — Patient Instructions (Signed)
Please start taking pravastatin 20mg  daily Please go for FASTING lipid panel at [facility] in the beginning of April  Please call me at [phone] with any issues

## 2023-09-25 NOTE — Assessment & Plan Note (Signed)
Assessment: LDL-C is above goal less than 70 Discussed ASCVD risk score of 12% (intermediate risk) Patient concerned about aortic stenosis, we discussed statins role in helping reduce this or slow the progression Discussed potential side effects of statin medications versus benefit Patient with leg cramps even not on statin medications.  She does take 2 tablespoons of mustard which helps significantly  Plan: Patient agreeable to trying pravastatin 20 mg daily Labs in 2 to 3 months (reminded her to fast for these labs) Patient to call with any tolerability issues.

## 2023-09-28 NOTE — Progress Notes (Signed)
 [facility] Summary Report / Loop Recorder

## 2023-09-28 NOTE — Addendum Note (Signed)
Addended by: Geralyn Flash D on: 09/28/2023 09:21 AM   Modules accepted: Orders

## 2023-09-30 ENCOUNTER — Ambulatory Visit: Payer: Medicare PPO | Attending: Internal Medicine | Admitting: Physical Therapy

## 2023-09-30 ENCOUNTER — Encounter: Payer: Self-pay | Admitting: Physical Therapy

## 2023-09-30 DIAGNOSIS — M62830 Muscle spasm of back: Secondary | ICD-10-CM | POA: Insufficient documentation

## 2023-09-30 DIAGNOSIS — R262 Difficulty in walking, not elsewhere classified: Secondary | ICD-10-CM | POA: Diagnosis not present

## 2023-09-30 DIAGNOSIS — M25561 Pain in right knee: Secondary | ICD-10-CM | POA: Insufficient documentation

## 2023-09-30 DIAGNOSIS — G8929 Other chronic pain: Secondary | ICD-10-CM | POA: Insufficient documentation

## 2023-09-30 DIAGNOSIS — R2681 Unsteadiness on feet: Secondary | ICD-10-CM | POA: Insufficient documentation

## 2023-09-30 DIAGNOSIS — M25512 Pain in left shoulder: Secondary | ICD-10-CM | POA: Diagnosis not present

## 2023-09-30 DIAGNOSIS — M5459 Other low back pain: Secondary | ICD-10-CM | POA: Insufficient documentation

## 2023-09-30 NOTE — Therapy (Addendum)
 OUTPATIENT PHYSICAL THERAPY THORACOLUMBAR EVALUATION   Patient Name: Jenna Mcdonald MRN: [id number] DOB:[birth year], 78 y.o., female Today's Date: 09/30/2023  END OF SESSION:  PT End of Session - 09/30/23 0848     Visit Number 1    Number of Visits 13    Date for PT Re-Evaluation 01/13/24    Authorization Type [employer]    PT Start Time 0845    PT Stop Time 0930    PT Time Calculation (min) 45 min    Activity Tolerance Patient tolerated treatment well    Behavior During Therapy [facility] for tasks assessed/performed              Past Medical History:  Diagnosis Date   Anemia    Aortic stenosis    mild by echo 09/2020 and 08/2022 with mean AVG   Breast CA (HCC)    left   Breast cancer (HCC)    Carotid stenosis    1-39% left   Degenerative disc disease, lumbar    of the knees   Diabetes mellitus    Dysrhythmia    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HCC)    Encounter for therapeutic drug monitoring 08/17/2018   Fatigue    Severe secondary to to gemfibrozil   Gastroesophageal reflux disease    denies   GI bleeding    a. 06/2018: EGD showed multiple nonbleeding duodenal ulcers. 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   Hammertoe    left second toe   Hypercholesteremia    Hypertension    Insomnia    Irritable bowel    Low back pain    Metabolic syndrome    Morbid obesity (HCC)    OSA on CPAP \   bipap   Osteoarthritis    Osteopenia    Persistent atrial fibrillation [facility]    s/p afib ablation - Dr. Kelsie 08/2018   Personal history of radiation therapy    Pneumonia    PONV (postoperative nausea and vomiting)    Sigmoid diverticulosis    Thalassemia minor    Unstable angina (HCC) 10/23/2020   Vitamin D  deficiency    Past Surgical History:  Procedure Laterality Date   ATRIAL FIBRILLATION ABLATION N/A 09/21/2018   Procedure: ATRIAL FIBRILLATION ABLATION;  Surgeon: Kelsie Agent, MD;  Location: [facility]  INVASIVE CV LAB;  Service: Cardiovascular;  Laterality: N/A;   BIOPSY  07/06/2018   Procedure: BIOPSY;  Surgeon: Rosalie Kitchens, MD;  Location: [facility] ENDOSCOPY;  Service: Endoscopy;;   BREAST LUMPECTOMY Left 05/28/2009   BREAST SURGERY Left 2010   lumpectomy   CARDIOVERSION N/A 07/07/2018   Procedure: CARDIOVERSION;  Surgeon: Raford Riggs, MD;  Location: [facility] ENDOSCOPY;  Service: Cardiovascular;  Laterality: N/A;   CARDIOVERSION N/A 07/14/2018   Procedure: CARDIOVERSION;  Surgeon: Rolan Ezra RAMAN, MD;  Location: [facility] ENDOSCOPY;  Service: Cardiovascular;  Laterality: N/A;   CESAREAN SECTION     COLONOSCOPY WITH PROPOFOL  N/A 07/06/2018   Procedure: COLONOSCOPY WITH PROPOFOL ;  Surgeon: Rosalie Kitchens, MD;  Location: [facility] ENDOSCOPY;  Service: Endoscopy;  Laterality: N/A;   CYSTECTOMY  1970   pilonidal    DILATION AND CURETTAGE OF UTERUS     ESOPHAGOGASTRODUODENOSCOPY (EGD) WITH PROPOFOL  N/A 07/06/2018   Procedure: ESOPHAGOGASTRODUODENOSCOPY (EGD) WITH PROPOFOL ;  Surgeon: Rosalie Kitchens, MD;  Location: [facility] ENDOSCOPY;  Service: Endoscopy;  Laterality: N/A;   EYE SURGERY Bilateral 07/2013   cataracts   HOT HEMOSTASIS N/A 07/06/2018   Procedure: HOT HEMOSTASIS (ARGON PLASMA COAGULATION/BICAP);  Surgeon:  Rosalie Kitchens, MD;  Location: [facility] ENDOSCOPY;  Service: Endoscopy;  Laterality: N/A;   implantable loop recorder placement  01/15/2022   Medtronic Reveal Linq model [id number] implantable loop recorder (SN [id number]) implanted for AF management by Dr Kelsie   MAXIMUM ACCESS (MAS)POSTERIOR LUMBAR INTERBODY FUSION (PLIF) 2 LEVEL N/A 09/22/2013   Procedure: FOR MAXIMUM ACCESS  POSTERIOR LUMBAR INTERBODY FUSION LUMBAR THREE-FOUR FOUR-FIVE;  Surgeon: Alm GORMAN Molt, MD;  Location: [facility] NEURO ORS;  Service: Neurosurgery;  Laterality: N/A;   OOPHORECTOMY     wedge section not rem   REPLACEMENT TOTAL KNEE Left 2011   REPLACEMENT TOTAL KNEE     RIGHT/LEFT HEART CATH AND CORONARY ANGIOGRAPHY N/A 10/26/2020   Procedure:  RIGHT/LEFT HEART CATH AND CORONARY ANGIOGRAPHY;  Surgeon: Anner Alm ORN, MD;  Location: [facility] INVASIVE CV LAB;  Service: Cardiovascular;  Laterality: N/A;   TEE WITHOUT CARDIOVERSION N/A 07/07/2018   Procedure: TRANSESOPHAGEAL ECHOCARDIOGRAM (TEE);  Surgeon: Raford Riggs, MD;  Location: [facility] ENDOSCOPY;  Service: Cardiovascular;  Laterality: N/A;   TEE WITHOUT CARDIOVERSION N/A 09/21/2018   Procedure: TRANSESOPHAGEAL ECHOCARDIOGRAM (TEE);  Surgeon: Alveta Aleene PARAS, MD;  Location: [facility] ENDOSCOPY;  Service: Cardiovascular;  Laterality: N/A;   TONSILLECTOMY     TUBAL LIGATION     Patient Active Problem List   Diagnosis Date Noted   Hyperlipidemia 09/25/2023   Shortness of breath    Unstable angina (HCC) 10/23/2020   Hypercoagulable state due to paroxysmal atrial fibrillation (HCC) 10/15/2020   Encounter for therapeutic drug monitoring 08/17/2018   CKD (chronic kidney disease) stage 3, GFR 30-59 ml/min (HCC) 07/21/2018   Chronic anemia 07/21/2018   Anemia    Heme positive stool    Atrial fibrillation with RVR (HCC) 07/04/2018   Paroxysmal atrial fibrillation (HCC) 07/02/2018   Carotid stenosis    Bruit of left carotid artery 09/22/2017   Aortic stenosis    Heart murmur 09/09/2016   Morbid obesity (HCC) 02/05/2016   Dry eye 05/17/2015   Type 2 diabetes mellitus without complication (HCC) 08/10/2014   Breast cancer, left breast (HCC) 07/22/2014   Obesity, Class III, BMI 40-49.9 (morbid obesity) (HCC) 07/06/2014   Macular pseudohole 05/17/2014   History of surgical procedure 05/17/2014   OSA (obstructive sleep apnea) 11/17/2013   S/P lumbar spinal fusion 09/22/2013   Persistent atrial fibrillation (HCC) 09/11/2011    Class: Acute   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HCC) 09/11/2011    Class: Chronic   Essential hypertension 09/11/2011   Exogenous obesity 09/11/2011   Gastroesophageal reflux disease 09/11/2011   Thalassemia minor 09/11/2011    Class: Chronic   Breast cancer (HCC) 09/11/2011     Class: Chronic   Degenerative joint disease 09/11/2011    Class: Chronic   Irritable bowel disease 09/11/2011    Class: Chronic   Lumbar disc disease 09/11/2011    PCP: Dwight, MD  REFERRING PROVIDER: CHARLENA Shutter, DO  REFERRING DIAG: chronic bag and knee pain  Rationale for Evaluation and Treatment: Rehabilitation  THERAPY DIAG:  Other low back pain  Difficulty in walking, not elsewhere classified  Muscle spasm of back  Unsteadiness on feet  Acute pain of right knee  Chronic left shoulder pain  ONSET DATE: 10/14/22  SUBJECTIVE:  SUBJECTIVE STATEMENT: Patient reports that she feels that she is unsteady on her feet and walking.  She did have a fall this AM.  She reports that her feet got caught in the bed spread and she fell, she reports that had pain in the knees and the back before but now having an increase of LBP.  She has a history of low back surgery fusion L3-5  PERTINENT HISTORY:  See above  PAIN:  Are you having pain? 7-8/10 in the low back, worse with standing , walking and activity, sit and rest pain can be a 5/10 but it is always there, ache, feels weak int eh core, reports knees are sore today after she had a fall  PRECAUTIONS: Other: lumbar fusion 2015  WEIGHT BEARING RESTRICTIONS: No  FALLS:  Has patient fallen in last 6 months? Yes, one fall this AM  LIVING ENVIRONMENT: Lives with: lives with their family Lives in: House/apartment Stairs: Yes: Internal: 15 steps; can reach both Has following equipment at home: has a cane and uses at times  OCCUPATION: retired  PLOF: Independent and gardens, does presenter, broadcasting, cooks and lexicographer, shops  PATIENT GOALS: strengthen core and have less pain  NEXT MD VISIT:   OBJECTIVE:   DIAGNOSTIC FINDINGS:  No recent x-rays   PATIENT  SURVEYS:  ODI = 54% disability  COGNITION: Overall cognitive status: Within functional limits for tasks assessed     SENSATION: WFL  MUSCLE LENGTH: Very tight HS, piriformis and quads  POSTURE: rounded shoulders and forward head  PALPATION: Tender and tight in the low back mms, tightness iin the rhomboids and the upper traps, very sore in the right low back and into the buttock   LUMBAR ROM:  all motions increased right low back pain  AROM eval  Flexion Decreased 50%  Extension Decreased 100%  Right lateral flexion Decreased 75%  Left lateral flexion Decreased 75%  Right rotation   Left rotation    (Blank rows = not tested)   Shoulder ROM left : AROM   Flexion: 100, abduction 95, ER 30, IR 40 all increase pain and she is very painful with ER and abduction LOWER EXTREMITY ROM:     Active  Right eval Left eval  Hip flexion    Hip extension    Hip abduction    Hip adduction    Hip internal rotation    Hip external rotation    Knee flexion 10 5  Knee extension 95 100  Ankle dorsiflexion    Ankle plantarflexion    Ankle inversion    Ankle eversion     (Blank rows = not tested)  LOWER EXTREMITY MMT:    MMT Right eval Left eval  Hip flexion 4- 4-  Hip extension 4- 4-  Hip abduction 4- 4-  Hip adduction    Hip internal rotation    Hip external rotation    Knee flexion 3+ 4-  Knee extension 3+ 4-  Ankle dorsiflexion 4 4  Ankle plantarflexion    Ankle inversion    Ankle eversion     (Blank rows = not tested)  FUNCTIONAL TESTS:  ODI= 27/50 54% TUG:  33 seconds  GAIT: Distance walked: 80 feet Assistive device utilized: no device Level of assistance: Complete Independence Comments: slow, right foot turned out, limp on the right  TODAY'S TREATMENT:  DATE:   09/30/23 Evaluation   PATIENT EDUCATION:  Education details:  POC Person educated: Patient Education method: Explanation Education comprehension: verbalized understanding  HOME EXERCISE PROGRAM: TBD  ASSESSMENT:  CLINICAL IMPRESSION: Patient is a 78 y.o. female who was seen today for physical therapy evaluation and treatment for low back pain, unsteady gait, knee pain and left shoulder pain.  She has a history of a 3 level fusion.  We saw her about a year ago and she did very well.  She reports that she has been more unsteady gait, denies falls until this morning, this may be why the pain levels are increased some today.  She also is having left shoulder pain, denies injury in the fall today, but reports that she has had decreased ROM, increase of pain.  OBJECTIVE IMPAIRMENTS: Abnormal gait, cardiopulmonary status limiting activity, decreased activity tolerance, decreased balance, decreased coordination, decreased endurance, decreased mobility, difficulty walking, decreased ROM, decreased strength, increased fascial restrictions, impaired perceived functional ability, increased muscle spasms, impaired flexibility, improper body mechanics, and pain.   REHAB POTENTIAL: Good  CLINICAL DECISION MAKING: Stable/uncomplicated  EVALUATION COMPLEXITY: Low   GOALS: Goals reviewed with patient? Yes  SHORT TERM GOALS: Target date: 10/24/23  Independent with initial HEP Goal status: INITIAL  LONG TERM GOALS: Target date: 12/28/23  Independent wiht posture and body mechanics Goal status: INITIAL  2.  Decrease TUG time to 15 seconds Goal status: INITIAL  3.  Decrease pain 25% Goal status: INITIAL  4.  Be able to cook a meal without pain > 5/10 Goal status: INITIAL  5.  Independent with HEP or gym Goal status: INITIAL  PLAN:  PT FREQUENCY: 1-2x/week  PT DURATION: 12 weeks  PLANNED INTERVENTIONS: Therapeutic exercises, Therapeutic activity, Neuromuscular re-education, Balance training, Gait training, Patient/Family education, Self Care, Joint  mobilization, Dry Needling, Electrical stimulation, Cryotherapy, Moist heat, and Manual therapy.  PLAN FOR NEXT SESSION: slowly start gym activities and flexibility   OBADIAH OZELL ORN, PT 09/30/2023, 9:34 AM

## 2023-10-05 ENCOUNTER — Other Ambulatory Visit (HOSPITAL_BASED_OUTPATIENT_CLINIC_OR_DEPARTMENT_OTHER): Payer: Self-pay

## 2023-10-05 MED ORDER — MOUNJARO 7.5 MG/0.5ML ~~LOC~~ SOAJ
7.5000 mg | SUBCUTANEOUS | 1 refills | Status: DC
Start: 2023-10-05 — End: 2024-02-24
  Filled 2023-10-05: qty 6, 84d supply, fill #0
  Filled 2023-12-23: qty 6, 84d supply, fill #1

## 2023-10-06 ENCOUNTER — Ambulatory Visit: Payer: Medicare PPO | Admitting: Physical Therapy

## 2023-10-13 ENCOUNTER — Ambulatory Visit: Payer: Medicare PPO | Admitting: Physical Therapy

## 2023-10-13 DIAGNOSIS — R262 Difficulty in walking, not elsewhere classified: Secondary | ICD-10-CM | POA: Diagnosis not present

## 2023-10-13 DIAGNOSIS — R2681 Unsteadiness on feet: Secondary | ICD-10-CM | POA: Diagnosis not present

## 2023-10-13 DIAGNOSIS — M62830 Muscle spasm of back: Secondary | ICD-10-CM | POA: Diagnosis not present

## 2023-10-13 DIAGNOSIS — M25561 Pain in right knee: Secondary | ICD-10-CM | POA: Diagnosis not present

## 2023-10-13 DIAGNOSIS — M5459 Other low back pain: Secondary | ICD-10-CM | POA: Diagnosis not present

## 2023-10-13 DIAGNOSIS — M25512 Pain in left shoulder: Secondary | ICD-10-CM | POA: Diagnosis not present

## 2023-10-13 DIAGNOSIS — G8929 Other chronic pain: Secondary | ICD-10-CM | POA: Diagnosis not present

## 2023-10-13 NOTE — Therapy (Signed)
OUTPATIENT PHYSICAL THERAPY THORACOLUMBAR    Patient Name: Jenna Mcdonald MRN: [id number] DOB:03-18-1946, 78 y.o., female Today's Date: 10/13/2023  END OF SESSION:  PT End of Session - 10/13/23 0755     Visit Number 2    Number of Visits 13    Date for PT Re-Evaluation 01/13/24    Authorization Type [employer]    PT Start Time 0755    PT Stop Time 0842    PT Time Calculation (min) 47 min              Past Medical History:  Diagnosis Date   Anemia    Aortic stenosis    mild by echo 09/2020 and 08/2022 with mean AVG   Breast CA (HCC)    left   Breast cancer (HCC)    Carotid stenosis    1-39% left   Degenerative disc disease, lumbar    of the knees   Diabetes mellitus    Dysrhythmia    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HCC)    Encounter for therapeutic drug monitoring 08/17/2018   Fatigue    Severe secondary to to gemfibrozil   Gastroesophageal reflux disease    denies   GI bleeding    a. 06/2018: EGD showed multiple nonbleeding duodenal ulcers. 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   Hammertoe    left second toe   Hypercholesteremia    Hypertension    Insomnia    Irritable bowel    Low back pain    Metabolic syndrome    Morbid obesity (HCC)    OSA on CPAP \   bipap   Osteoarthritis    Osteopenia    Persistent atrial fibrillation [facility])    s/p afib ablation - Dr. Johney Frame 08/2018   Personal history of radiation therapy    Pneumonia    PONV (postoperative nausea and vomiting)    Sigmoid diverticulosis    Thalassemia minor    Unstable angina (HCC) 10/23/2020   Vitamin D deficiency    Past Surgical History:  Procedure Laterality Date   ATRIAL FIBRILLATION ABLATION N/A 09/21/2018   Procedure: ATRIAL FIBRILLATION ABLATION;  Surgeon: Hillis Range, MD;  Location: [facility] INVASIVE CV LAB;  Service: Cardiovascular;  Laterality: N/A;   BIOPSY  07/06/2018   Procedure: BIOPSY;  Surgeon: Vida Rigger,  MD;  Location: [facility] ENDOSCOPY;  Service: Endoscopy;;   BREAST LUMPECTOMY Left 05/28/2009   BREAST SURGERY Left 2010   lumpectomy   CARDIOVERSION N/A 07/07/2018   Procedure: CARDIOVERSION;  Surgeon: Chilton Si, MD;  Location: [facility] ENDOSCOPY;  Service: Cardiovascular;  Laterality: N/A;   CARDIOVERSION N/A 07/14/2018   Procedure: CARDIOVERSION;  Surgeon: Laurey Morale, MD;  Location: [facility] ENDOSCOPY;  Service: Cardiovascular;  Laterality: N/A;   CESAREAN SECTION     COLONOSCOPY WITH PROPOFOL N/A 07/06/2018   Procedure: COLONOSCOPY WITH PROPOFOL;  Surgeon: Vida Rigger, MD;  Location: [facility] ENDOSCOPY;  Service: Endoscopy;  Laterality: N/A;   CYSTECTOMY  1970   pilonidal    DILATION AND CURETTAGE OF UTERUS     ESOPHAGOGASTRODUODENOSCOPY (EGD) WITH PROPOFOL N/A 07/06/2018   Procedure: ESOPHAGOGASTRODUODENOSCOPY (EGD) WITH PROPOFOL;  Surgeon: Vida Rigger, MD;  Location: [facility] ENDOSCOPY;  Service: Endoscopy;  Laterality: N/A;   EYE SURGERY Bilateral 07/2013   cataracts   HOT HEMOSTASIS N/A 07/06/2018   Procedure: HOT HEMOSTASIS (ARGON PLASMA COAGULATION/BICAP);  Surgeon: Vida Rigger, MD;  Location: [facility] ENDOSCOPY;  Service: Endoscopy;  Laterality: N/A;   implantable loop recorder placement  01/15/2022   [employer] Reveal Linq model [id number] implantable loop recorder (SN [id number]) implanted for AF management by Dr Johney Frame   MAXIMUM ACCESS (MAS)POSTERIOR LUMBAR INTERBODY FUSION (PLIF) 2 LEVEL N/A 09/22/2013   Procedure: FOR MAXIMUM ACCESS  POSTERIOR LUMBAR INTERBODY FUSION LUMBAR THREE-FOUR FOUR-FIVE;  Surgeon: Tia Alert, MD;  Location: [facility] NEURO ORS;  Service: Neurosurgery;  Laterality: N/A;   OOPHORECTOMY     wedge section not rem   REPLACEMENT TOTAL KNEE Left 2011   REPLACEMENT TOTAL KNEE     RIGHT/LEFT HEART CATH AND CORONARY ANGIOGRAPHY N/A 10/26/2020   Procedure: RIGHT/LEFT HEART CATH AND CORONARY ANGIOGRAPHY;  Surgeon: Marykay Lex, MD;  Location: [facility] INVASIVE CV LAB;  Service: Cardiovascular;   Laterality: N/A;   TEE WITHOUT CARDIOVERSION N/A 07/07/2018   Procedure: TRANSESOPHAGEAL ECHOCARDIOGRAM (TEE);  Surgeon: Chilton Si, MD;  Location: [facility] ENDOSCOPY;  Service: Cardiovascular;  Laterality: N/A;   TEE WITHOUT CARDIOVERSION N/A 09/21/2018   Procedure: TRANSESOPHAGEAL ECHOCARDIOGRAM (TEE);  Surgeon: Elease Hashimoto Deloris Ping, MD;  Location: [facility] ENDOSCOPY;  Service: Cardiovascular;  Laterality: N/A;   TONSILLECTOMY     TUBAL LIGATION     Patient Active Problem List   Diagnosis Date Noted   Hyperlipidemia 09/25/2023   Shortness of breath    Unstable angina (HCC) 10/23/2020   Hypercoagulable state due to paroxysmal atrial fibrillation (HCC) 10/15/2020   Encounter for therapeutic drug monitoring 08/17/2018   CKD (chronic kidney disease) stage 3, GFR 30-59 ml/min (HCC) 07/21/2018   Chronic anemia 07/21/2018   Anemia    Heme positive stool    Atrial fibrillation with RVR (HCC) 07/04/2018   Paroxysmal atrial fibrillation (HCC) 07/02/2018   Carotid stenosis    Bruit of left carotid artery 09/22/2017   Aortic stenosis    Heart murmur 09/09/2016   Morbid obesity (HCC) 02/05/2016   Dry eye 05/17/2015   Type 2 diabetes mellitus without complication (HCC) 08/10/2014   Breast cancer, left breast (HCC) 07/22/2014   Obesity, Class III, BMI 40-49.9 (morbid obesity) (HCC) 07/06/2014   Macular pseudohole 05/17/2014   History of surgical procedure 05/17/2014   OSA (obstructive sleep apnea) 11/17/2013   S/P lumbar spinal fusion 09/22/2013   Persistent atrial fibrillation (HCC) 09/11/2011    Class: Acute   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HCC) 09/11/2011    Class: Chronic   Essential hypertension 09/11/2011   Exogenous obesity 09/11/2011   Gastroesophageal reflux disease 09/11/2011   Thalassemia minor 09/11/2011    Class: Chronic   Breast cancer (HCC) 09/11/2011    Class: Chronic   Degenerative joint disease 09/11/2011    Class: Chronic   Irritable bowel disease 09/11/2011    Class: Chronic    Lumbar disc disease 09/11/2011    PCP: Margaretann Loveless, MD  REFERRING PROVIDER: Baird Lyons, DO  REFERRING DIAG: chronic bag and knee pain  Rationale for Evaluation and Treatment: Rehabilitation  THERAPY DIAG:  Other low back pain  Difficulty in walking, not elsewhere classified  Muscle spasm of back  Unsteadiness on feet  ONSET DATE: 10/14/22  SUBJECTIVE:  SUBJECTIVE STATEMENT:  Sorry I had to cancel last week I was sick, still have a cold. Doing okay ,same as eval.   Patient reports that she feels that she is unsteady on her feet and walking.  She did have a fall this AM.  She reports that her feet got caught in the bed spread and she fell, she reports that had pain in the knees and the back before but now having an increase of LBP.  She has a history of low back surgery fusion L3-5  PERTINENT HISTORY:  See above  PAIN:  Are you having pain? 7-8/10 in the low back, worse with standing , walking and activity, sit and rest pain can be a 5/10 but it is always there, ache, feels weak int eh core, reports knees are sore today after she had a fall  PRECAUTIONS: Other: lumbar fusion 2015  WEIGHT BEARING RESTRICTIONS: No  FALLS:  Has patient fallen in last 6 months? Yes, one fall this AM  LIVING ENVIRONMENT: Lives with: lives with their family Lives in: House/apartment Stairs: Yes: Internal: 15 steps; can reach both Has following equipment at home: has a cane and uses at times  OCCUPATION: retired  PLOF: Independent and gardens, does Presenter, broadcasting, cooks and Lexicographer, shops  PATIENT GOALS: strengthen core and have less pain  NEXT MD VISIT:   OBJECTIVE:   DIAGNOSTIC FINDINGS:  No recent x-rays   PATIENT SURVEYS:  ODI = 54% disability  COGNITION: Overall cognitive status: Within functional limits  for tasks assessed     SENSATION: WFL  MUSCLE LENGTH: Very tight HS, piriformis and quads  POSTURE: rounded shoulders and forward head  PALPATION: Tender and tight in the low back mms, tightness iin the rhomboids and the upper traps, very sore in the right low back and into the buttock   LUMBAR ROM:  all motions increased right low back pain  AROM eval  Flexion Decreased 50%  Extension Decreased 100%  Right lateral flexion Decreased 75%  Left lateral flexion Decreased 75%  Right rotation   Left rotation    (Blank rows = not tested)   Shoulder ROM left : AROM   Flexion: 100, abduction 95, ER 30, IR 40 all increase pain and she is very painful with ER and abduction LOWER EXTREMITY ROM:     Active  Right eval Left eval  Hip flexion    Hip extension    Hip abduction    Hip adduction    Hip internal rotation    Hip external rotation    Knee flexion 10 5  Knee extension 95 100  Ankle dorsiflexion    Ankle plantarflexion    Ankle inversion    Ankle eversion     (Blank rows = not tested)  LOWER EXTREMITY MMT:    MMT Right eval Left eval  Hip flexion 4- 4-  Hip extension 4- 4-  Hip abduction 4- 4-  Hip adduction    Hip internal rotation    Hip external rotation    Knee flexion 3+ 4-  Knee extension 3+ 4-  Ankle dorsiflexion 4 4  Ankle plantarflexion    Ankle inversion    Ankle eversion     (Blank rows = not tested)  FUNCTIONAL TESTS:  ODI= 27/50 54% TUG:  33 seconds  GAIT: Distance walked: 80 feet Assistive device utilized: no device Level of assistance: Complete Independence Comments: slow, right foot turned out, limp on the right  TODAY'S TREATMENT:  DATE:    10/13/23 Nustep L 5 10 min- 2 rest needed for SOB/fatigue Black tband trunk flex and ext 2 sets 10 Seated row 20# 2 sets 10 LAQ , hip flex and hip abd # 2 sets  10 ADD ball squeeze 2 sets 10 Red tband HS curl 2 sets 10 Gentle stretching of LEs and trunk   09/30/23 Evaluation   PATIENT EDUCATION:  Education details: POC Person educated: Patient Education method: Explanation Education comprehension: verbalized understanding  HOME EXERCISE PROGRAM: TBD  ASSESSMENT:  CLINICAL IMPRESSION:  Pt returns to PT after evaluation. States she is getting over being sick. Initiated ex in clinic for ROM,strength and stamina with cuing for correct muscle activation. PROM and stretching LE and trunk.  Tolerated well some SOB d/t cold. Pt is hopeful this will help as she has had good relief in the past with therapy.    Patient is a 78 y.o. female who was seen today for physical therapy evaluation and treatment for low back pain, unsteady gait, knee pain and left shoulder pain.  She has a history of a 3 level fusion.  We saw her about a year ago and she did very well.  She reports that she has been more unsteady gait, denies falls until this morning, this may be why the pain levels are increased some today.  She also is having left shoulder pain, denies injury in the fall today, but reports that she has had decreased ROM, increase of pain.  OBJECTIVE IMPAIRMENTS: Abnormal gait, cardiopulmonary status limiting activity, decreased activity tolerance, decreased balance, decreased coordination, decreased endurance, decreased mobility, difficulty walking, decreased ROM, decreased strength, increased fascial restrictions, impaired perceived functional ability, increased muscle spasms, impaired flexibility, improper body mechanics, and pain.   REHAB POTENTIAL: Good  CLINICAL DECISION MAKING: Stable/uncomplicated  EVALUATION COMPLEXITY: Low   GOALS: Goals reviewed with patient? Yes  SHORT TERM GOALS: Target date: 10/24/23  Independent with initial HEP Goal status: INITIAL  LONG TERM GOALS: Target date: 12/28/23  Independent wiht posture and body  mechanics Goal status: INITIAL  2.  Decrease TUG time to 15 seconds Goal status: INITIAL  3.  Decrease pain 25% Goal status: INITIAL  4.  Be able to cook a meal without pain > 5/10 Goal status: INITIAL  5.  Independent with HEP or gym Goal status: INITIAL  PLAN:  PT FREQUENCY: 1-2x/week  PT DURATION: 12 weeks  PLANNED INTERVENTIONS: Therapeutic exercises, Therapeutic activity, Neuromuscular re-education, Balance training, Gait training, Patient/Family education, Self Care, Joint mobilization, Dry Needling, Electrical stimulation, Cryotherapy, Moist heat, and Manual therapy.  PLAN FOR NEXT SESSION: slowly start gym activities and flexibility   Airen Dales,ANGIE, PTA 10/13/2023, 7:56 AM [facility] [place], [place], [address] Phone: [phone]   Fax:  [phone]  Patient Details  Name: Jazzie Trampe MRN: [id number] Date of Birth: [birth year] Referring Provider:  Thana Ates, MD  Encounter Date: 10/13/2023   Suanne Marker, PTA 10/13/2023, 7:56 AM  [facility] [address] [place], [place], [address] Phone: [phone]   Fax:  [phone]

## 2023-10-17 ENCOUNTER — Encounter: Payer: Self-pay | Admitting: Cardiology

## 2023-10-19 ENCOUNTER — Other Ambulatory Visit: Payer: Self-pay

## 2023-10-19 MED ORDER — PRAVASTATIN SODIUM 20 MG PO TABS: 20.0000 mg | ORAL_TABLET | Freq: Every evening | ORAL | 3 refills | Status: DC

## 2023-10-20 ENCOUNTER — Ambulatory Visit: Payer: Medicare PPO | Admitting: Physical Therapy

## 2023-10-22 ENCOUNTER — Telehealth: Payer: Self-pay | Admitting: Pharmacist

## 2023-10-22 NOTE — Telephone Encounter (Signed)
 Pt c/o medication issue:  1. Name of Medication: pravastatin (PRAVACHOL) 20 MG tablet   2. How are you currently taking this medication (dosage and times per day)? As written   3. Are you having a reaction (difficulty breathing--STAT)? No   4. What is your medication issue? Pt called in stating the Pharmacist prescribed her this medication and she has been cramping in her legs and arms significantly, please advise.

## 2023-10-23 NOTE — Telephone Encounter (Signed)
 Patient is call back for update. Please advise

## 2023-10-23 NOTE — Telephone Encounter (Signed)
 Patient reporting pain in her legs that just started. Body aches-arms and legs mainly. No fever, no congestion. No increase in exercise that could have caused the soreness. She did not take pravastatin last night. Advised she continue to hold. If she does not feel better in a week or develops any other symptoms call her GP. Call us in 2 weeks to let us know if she feels better off the pravastatin.

## 2023-10-26 ENCOUNTER — Ambulatory Visit: Payer: Medicare PPO

## 2023-10-26 DIAGNOSIS — I480 Paroxysmal atrial fibrillation: Secondary | ICD-10-CM

## 2023-10-27 ENCOUNTER — Ambulatory Visit: Payer: Medicare PPO | Admitting: Physical Therapy

## 2023-10-28 ENCOUNTER — Encounter: Payer: Self-pay | Admitting: Cardiovascular Disease

## 2023-10-28 ENCOUNTER — Ambulatory Visit: Payer: Medicare PPO | Attending: Internal Medicine | Admitting: Physical Therapy

## 2023-10-28 ENCOUNTER — Encounter: Payer: Self-pay | Admitting: Physical Therapy

## 2023-10-28 DIAGNOSIS — M62830 Muscle spasm of back: Secondary | ICD-10-CM | POA: Insufficient documentation

## 2023-10-28 DIAGNOSIS — R2681 Unsteadiness on feet: Secondary | ICD-10-CM | POA: Insufficient documentation

## 2023-10-28 DIAGNOSIS — M5459 Other low back pain: Secondary | ICD-10-CM | POA: Diagnosis not present

## 2023-10-28 DIAGNOSIS — G8929 Other chronic pain: Secondary | ICD-10-CM | POA: Diagnosis not present

## 2023-10-28 DIAGNOSIS — M25561 Pain in right knee: Secondary | ICD-10-CM | POA: Diagnosis not present

## 2023-10-28 DIAGNOSIS — M25512 Pain in left shoulder: Secondary | ICD-10-CM | POA: Insufficient documentation

## 2023-10-28 DIAGNOSIS — R262 Difficulty in walking, not elsewhere classified: Secondary | ICD-10-CM | POA: Diagnosis not present

## 2023-10-28 LAB — CUP PACEART REMOTE DEVICE CHECK
Date Time Interrogation Session: 20250302230921
Implantable Pulse Generator Implant Date: 20230524

## 2023-10-28 NOTE — Therapy (Signed)
 OUTPATIENT PHYSICAL THERAPY THORACOLUMBAR    Patient Name: Jenna Mcdonald MRN: [id number] DOB:1945/12/13, 78 y.o., female Today's Date: 10/28/2023  END OF SESSION:  PT End of Session - 10/28/23 0932     Visit Number 3    Number of Visits 13    Date for PT Re-Evaluation 01/13/24    Authorization Type [employer] 3/13    PT Start Time 0928    PT Stop Time 1013    PT Time Calculation (min) 45 min    Activity Tolerance Patient tolerated treatment well    Behavior During Therapy [facility] for tasks assessed/performed              Past Medical History:  Diagnosis Date   Anemia    Aortic stenosis    mild by echo 09/2020 and 08/2022 with mean AVG   Breast CA (HCC)    left   Breast cancer (HCC)    Carotid stenosis    1-39% left   Degenerative disc disease, lumbar    of the knees   Diabetes mellitus    Dysrhythmia    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HCC)    Encounter for therapeutic drug monitoring 08/17/2018   Fatigue    Severe secondary to to gemfibrozil   Gastroesophageal reflux disease    denies   GI bleeding    a. 06/2018: EGD showed multiple nonbleeding duodenal ulcers. 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   Hammertoe    left second toe   Hypercholesteremia    Hypertension    Insomnia    Irritable bowel    Low back pain    Metabolic syndrome    Morbid obesity (HCC)    OSA on CPAP \   bipap   Osteoarthritis    Osteopenia    Persistent atrial fibrillation [facility])    s/p afib ablation - Dr. Johney Frame 08/2018   Personal history of radiation therapy    Pneumonia    PONV (postoperative nausea and vomiting)    Sigmoid diverticulosis    Thalassemia minor    Unstable angina (HCC) 10/23/2020   Vitamin D deficiency    Past Surgical History:  Procedure Laterality Date   ATRIAL FIBRILLATION ABLATION N/A 09/21/2018   Procedure: ATRIAL FIBRILLATION ABLATION;  Surgeon: Hillis Range, MD;  Location: [facility] INVASIVE  CV LAB;  Service: Cardiovascular;  Laterality: N/A;   BIOPSY  07/06/2018   Procedure: BIOPSY;  Surgeon: Vida Rigger, MD;  Location: [facility] ENDOSCOPY;  Service: Endoscopy;;   BREAST LUMPECTOMY Left 05/28/2009   BREAST SURGERY Left 2010   lumpectomy   CARDIOVERSION N/A 07/07/2018   Procedure: CARDIOVERSION;  Surgeon: Chilton Si, MD;  Location: [facility] ENDOSCOPY;  Service: Cardiovascular;  Laterality: N/A;   CARDIOVERSION N/A 07/14/2018   Procedure: CARDIOVERSION;  Surgeon: Laurey Morale, MD;  Location: [facility] ENDOSCOPY;  Service: Cardiovascular;  Laterality: N/A;   CESAREAN SECTION     COLONOSCOPY WITH PROPOFOL N/A 07/06/2018   Procedure: COLONOSCOPY WITH PROPOFOL;  Surgeon: Vida Rigger, MD;  Location: [facility] ENDOSCOPY;  Service: Endoscopy;  Laterality: N/A;   CYSTECTOMY  1970   pilonidal    DILATION AND CURETTAGE OF UTERUS     ESOPHAGOGASTRODUODENOSCOPY (EGD) WITH PROPOFOL N/A 07/06/2018   Procedure: ESOPHAGOGASTRODUODENOSCOPY (EGD) WITH PROPOFOL;  Surgeon: Vida Rigger, MD;  Location: [facility] ENDOSCOPY;  Service: Endoscopy;  Laterality: N/A;   EYE SURGERY Bilateral 07/2013   cataracts   HOT HEMOSTASIS N/A 07/06/2018   Procedure: HOT HEMOSTASIS (ARGON PLASMA COAGULATION/BICAP);  Surgeon: Vida Rigger, MD;  Location: [facility] ENDOSCOPY;  Service: Endoscopy;  Laterality: N/A;   implantable loop recorder placement  01/15/2022   [employer] Reveal Linq model [id number] implantable loop recorder (SN [id number]) implanted for AF management by Dr Johney Frame   MAXIMUM ACCESS (MAS)POSTERIOR LUMBAR INTERBODY FUSION (PLIF) 2 LEVEL N/A 09/22/2013   Procedure: FOR MAXIMUM ACCESS  POSTERIOR LUMBAR INTERBODY FUSION LUMBAR THREE-FOUR FOUR-FIVE;  Surgeon: Tia Alert, MD;  Location: [facility] NEURO ORS;  Service: Neurosurgery;  Laterality: N/A;   OOPHORECTOMY     wedge section not rem   REPLACEMENT TOTAL KNEE Left 2011   REPLACEMENT TOTAL KNEE     RIGHT/LEFT HEART CATH AND CORONARY ANGIOGRAPHY N/A 10/26/2020   Procedure: RIGHT/LEFT HEART  CATH AND CORONARY ANGIOGRAPHY;  Surgeon: Marykay Lex, MD;  Location: [facility] INVASIVE CV LAB;  Service: Cardiovascular;  Laterality: N/A;   TEE WITHOUT CARDIOVERSION N/A 07/07/2018   Procedure: TRANSESOPHAGEAL ECHOCARDIOGRAM (TEE);  Surgeon: Chilton Si, MD;  Location: [facility] ENDOSCOPY;  Service: Cardiovascular;  Laterality: N/A;   TEE WITHOUT CARDIOVERSION N/A 09/21/2018   Procedure: TRANSESOPHAGEAL ECHOCARDIOGRAM (TEE);  Surgeon: Elease Hashimoto Deloris Ping, MD;  Location: [facility] ENDOSCOPY;  Service: Cardiovascular;  Laterality: N/A;   TONSILLECTOMY     TUBAL LIGATION     Patient Active Problem List   Diagnosis Date Noted   Hyperlipidemia 09/25/2023   Shortness of breath    Unstable angina (HCC) 10/23/2020   Hypercoagulable state due to paroxysmal atrial fibrillation (HCC) 10/15/2020   Encounter for therapeutic drug monitoring 08/17/2018   CKD (chronic kidney disease) stage 3, GFR 30-59 ml/min (HCC) 07/21/2018   Chronic anemia 07/21/2018   Anemia    Heme positive stool    Atrial fibrillation with RVR (HCC) 07/04/2018   Paroxysmal atrial fibrillation (HCC) 07/02/2018   Carotid stenosis    Bruit of left carotid artery 09/22/2017   Aortic stenosis    Heart murmur 09/09/2016   Morbid obesity (HCC) 02/05/2016   Dry eye 05/17/2015   Type 2 diabetes mellitus without complication (HCC) 08/10/2014   Breast cancer, left breast (HCC) 07/22/2014   Obesity, Class III, BMI 40-49.9 (morbid obesity) (HCC) 07/06/2014   Macular pseudohole 05/17/2014   History of surgical procedure 05/17/2014   OSA (obstructive sleep apnea) 11/17/2013   S/P lumbar spinal fusion 09/22/2013   Persistent atrial fibrillation (HCC) 09/11/2011    Class: Acute   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HCC) 09/11/2011    Class: Chronic   Essential hypertension 09/11/2011   Exogenous obesity 09/11/2011   Gastroesophageal reflux disease 09/11/2011   Thalassemia minor 09/11/2011    Class: Chronic   Breast cancer (HCC) 09/11/2011    Class: Chronic    Degenerative joint disease 09/11/2011    Class: Chronic   Irritable bowel disease 09/11/2011    Class: Chronic   Lumbar disc disease 09/11/2011    PCP: Margaretann Loveless, MD  REFERRING PROVIDER: Baird Lyons, DO  REFERRING DIAG: chronic bag and knee pain  Rationale for Evaluation and Treatment: Rehabilitation  THERAPY DIAG:  Other low back pain  Difficulty in walking, not elsewhere classified  Muscle spasm of back  Unsteadiness on feet  Acute pain of right knee  Chronic left shoulder pain  ONSET DATE: 10/14/22  SUBJECTIVE:  SUBJECTIVE STATEMENT:  Patient is reporting much worse LE pain, reports that she thinks it is a statin that she started recently, she says she stopped taking it Friday and is feeling a little better but still hurting   Patient reports that she feels that she is unsteady on her feet and walking.  She did have a fall this AM.  She reports that her feet got caught in the bed spread and she fell, she reports that had pain in the knees and the back before but now having an increase of LBP.  She has a history of low back surgery fusion L3-5  PERTINENT HISTORY:  See above  PAIN:  Are you having pain? 7-8/10 in the low back, worse with standing , walking and activity, sit and rest pain can be a 5/10 but it is always there, ache, feels weak int eh core, reports knees are sore today after she had a fall  PRECAUTIONS: Other: lumbar fusion 2015  WEIGHT BEARING RESTRICTIONS: No  FALLS:  Has patient fallen in last 6 months? Yes, one fall this AM  LIVING ENVIRONMENT: Lives with: lives with their family Lives in: House/apartment Stairs: Yes: Internal: 15 steps; can reach both Has following equipment at home: has a cane and uses at times  OCCUPATION: retired  PLOF: Independent and  gardens, does Presenter, broadcasting, cooks and Lexicographer, shops  PATIENT GOALS: strengthen core and have less pain  NEXT MD VISIT:   OBJECTIVE:   DIAGNOSTIC FINDINGS:  No recent x-rays   PATIENT SURVEYS:  ODI = 54% disability  COGNITION: Overall cognitive status: Within functional limits for tasks assessed     SENSATION: WFL  MUSCLE LENGTH: Very tight HS, piriformis and quads  POSTURE: rounded shoulders and forward head  PALPATION: Tender and tight in the low back mms, tightness iin the rhomboids and the upper traps, very sore in the right low back and into the buttock   LUMBAR ROM:  all motions increased right low back pain  AROM eval  Flexion Decreased 50%  Extension Decreased 100%  Right lateral flexion Decreased 75%  Left lateral flexion Decreased 75%  Right rotation   Left rotation    (Blank rows = not tested)   Shoulder ROM left : AROM   Flexion: 100, abduction 95, ER 30, IR 40 all increase pain and she is very painful with ER and abduction LOWER EXTREMITY ROM:     Active  Right eval Left eval  Hip flexion    Hip extension    Hip abduction    Hip adduction    Hip internal rotation    Hip external rotation    Knee flexion 10 5  Knee extension 95 100  Ankle dorsiflexion    Ankle plantarflexion    Ankle inversion    Ankle eversion     (Blank rows = not tested)  LOWER EXTREMITY MMT:    MMT Right eval Left eval  Hip flexion 4- 4-  Hip extension 4- 4-  Hip abduction 4- 4-  Hip adduction    Hip internal rotation    Hip external rotation    Knee flexion 3+ 4-  Knee extension 3+ 4-  Ankle dorsiflexion 4 4  Ankle plantarflexion    Ankle inversion    Ankle eversion     (Blank rows = not tested)  FUNCTIONAL TESTS:  ODI= 27/50 54% TUG:  33 seconds  GAIT: Distance walked: 80 feet Assistive device utilized: no device Level of assistance: Complete Independence Comments: slow,  right foot turned out, limp on the right  TODAY'S TREATMENT:                                                                                                                               DATE:   10/28/23 Nustep level 5 x 7 minutes Feet on ball K2C, trunk rotation, small bridge, isometric abs 20# leg curls Ball b/n knees squeeze Green tband clamshells Passive stretch of the LE's Seated red tband row and extension STM to the buttock and back  10/13/23 Nustep L 5 10 min- 2 rest needed for SOB/fatigue Black tband trunk flex and ext 2 sets 10 Seated row 20# 2 sets 10 LAQ , hip flex and hip abd # 2 sets 10 ADD ball squeeze 2 sets 10 Red tband HS curl 2 sets 10 Gentle stretching of LEs and trunk   09/30/23 Evaluation   PATIENT EDUCATION:  Education details: POC Person educated: Patient Education method: Explanation Education comprehension: verbalized understanding  HOME EXERCISE PROGRAM: TBD  ASSESSMENT:  CLINICAL IMPRESSION:  Pt reports that she has had a lot of leg pain for the past week, reports that she really thinks it is the statin that she started 3 weeks ago, she stopped it Friday is feeling better but still really hurting.  We tried some stretches and exercises as well as some STM, this seemed to help some  Patient is a 78 y.o. female who was seen today for physical therapy evaluation and treatment for low back pain, unsteady gait, knee pain and left shoulder pain.  She has a history of a 3 level fusion.  We saw her about a year ago and she did very well.  She reports that she has been more unsteady gait, denies falls until this morning, this may be why the pain levels are increased some today.  She also is having left shoulder pain, denies injury in the fall today, but reports that she has had decreased ROM, increase of pain.  OBJECTIVE IMPAIRMENTS: Abnormal gait, cardiopulmonary status limiting activity, decreased activity tolerance, decreased balance, decreased coordination, decreased endurance, decreased mobility, difficulty walking, decreased ROM,  decreased strength, increased fascial restrictions, impaired perceived functional ability, increased muscle spasms, impaired flexibility, improper body mechanics, and pain.   REHAB POTENTIAL: Good  CLINICAL DECISION MAKING: Stable/uncomplicated  EVALUATION COMPLEXITY: Low   GOALS: Goals reviewed with patient? Yes  SHORT TERM GOALS: Target date: 10/24/23  Independent with initial HEP Goal status: progressing 10/28/23  LONG TERM GOALS: Target date: 12/28/23  Independent wiht posture and body mechanics Goal status: INITIAL  2.  Decrease TUG time to 15 seconds Goal status: INITIAL  3.  Decrease pain 25% Goal status: INITIAL  4.  Be able to cook a meal without pain > 5/10 Goal status: INITIAL  5.  Independent with HEP or gym Goal status: INITIAL  PLAN:  PT FREQUENCY: 1-2x/week  PT DURATION: 12 weeks  PLANNED INTERVENTIONS: Therapeutic exercises, Therapeutic activity,  Neuromuscular re-education, Balance training, Gait training, Patient/Family education, Self Care, Joint mobilization, Dry Needling, Electrical stimulation, Cryotherapy, Moist heat, and Manual therapy.  PLAN FOR NEXT SESSION: slowly start gym activities and flexibility   Jearld Lesch, PT 10/28/2023, 9:34 AM [facility]. [place], [place], [address] Phone: [phone]   Fax:  [phone]

## 2023-10-29 ENCOUNTER — Ambulatory Visit: Payer: Medicare PPO | Admitting: Physical Therapy

## 2023-10-30 NOTE — Progress Notes (Signed)
 [facility] Summary Report / Loop Recorder

## 2023-11-09 ENCOUNTER — Ambulatory Visit: Admitting: Physical Therapy

## 2023-11-09 DIAGNOSIS — M62830 Muscle spasm of back: Secondary | ICD-10-CM

## 2023-11-09 DIAGNOSIS — R262 Difficulty in walking, not elsewhere classified: Secondary | ICD-10-CM

## 2023-11-09 DIAGNOSIS — G8929 Other chronic pain: Secondary | ICD-10-CM | POA: Diagnosis not present

## 2023-11-09 DIAGNOSIS — M5459 Other low back pain: Secondary | ICD-10-CM

## 2023-11-09 DIAGNOSIS — M25512 Pain in left shoulder: Secondary | ICD-10-CM | POA: Diagnosis not present

## 2023-11-09 DIAGNOSIS — M25561 Pain in right knee: Secondary | ICD-10-CM | POA: Diagnosis not present

## 2023-11-09 DIAGNOSIS — R2681 Unsteadiness on feet: Secondary | ICD-10-CM | POA: Diagnosis not present

## 2023-11-09 NOTE — Therapy (Signed)
 OUTPATIENT PHYSICAL THERAPY THORACOLUMBAR    Patient Name: Jenna Mcdonald MRN: [id number] DOB:October 21, 1945, 78 y.o., female Today's Date: 11/09/2023  END OF SESSION:  PT End of Session - 11/09/23 0757     Visit Number 4    Number of Visits 13    Date for PT Re-Evaluation 01/13/24    Authorization Type [employer] 3/13    PT Start Time 0755    PT Stop Time 0835    PT Time Calculation (min) 40 min              Past Medical History:  Diagnosis Date   Anemia    Aortic stenosis    mild by echo 09/2020 and 08/2022 with mean AVG   Breast CA (HCC)    left   Breast cancer (HCC)    Carotid stenosis    1-39% left   Degenerative disc disease, lumbar    of the knees   Diabetes mellitus    Dysrhythmia    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HCC)    Encounter for therapeutic drug monitoring 08/17/2018   Fatigue    Severe secondary to to gemfibrozil   Gastroesophageal reflux disease    denies   GI bleeding    a. 06/2018: EGD showed multiple nonbleeding duodenal ulcers. 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   Hammertoe    left second toe   Hypercholesteremia    Hypertension    Insomnia    Irritable bowel    Low back pain    Metabolic syndrome    Morbid obesity (HCC)    OSA on CPAP \   bipap   Osteoarthritis    Osteopenia    Persistent atrial fibrillation [facility])    s/p afib ablation - Dr. Johney Frame 08/2018   Personal history of radiation therapy    Pneumonia    PONV (postoperative nausea and vomiting)    Sigmoid diverticulosis    Thalassemia minor    Unstable angina (HCC) 10/23/2020   Vitamin D deficiency    Past Surgical History:  Procedure Laterality Date   ATRIAL FIBRILLATION ABLATION N/A 09/21/2018   Procedure: ATRIAL FIBRILLATION ABLATION;  Surgeon: Hillis Range, MD;  Location: [facility] INVASIVE CV LAB;  Service: Cardiovascular;  Laterality: N/A;   BIOPSY  07/06/2018   Procedure: BIOPSY;  Surgeon: Vida Rigger, MD;  Location: [facility] ENDOSCOPY;  Service: Endoscopy;;   BREAST LUMPECTOMY Left 05/28/2009   BREAST SURGERY Left 2010   lumpectomy   CARDIOVERSION N/A 07/07/2018   Procedure: CARDIOVERSION;  Surgeon: Chilton Si, MD;  Location: [facility] ENDOSCOPY;  Service: Cardiovascular;  Laterality: N/A;   CARDIOVERSION N/A 07/14/2018   Procedure: CARDIOVERSION;  Surgeon: Laurey Morale, MD;  Location: [facility] ENDOSCOPY;  Service: Cardiovascular;  Laterality: N/A;   CESAREAN SECTION     COLONOSCOPY WITH PROPOFOL N/A 07/06/2018   Procedure: COLONOSCOPY WITH PROPOFOL;  Surgeon: Vida Rigger, MD;  Location: [facility] ENDOSCOPY;  Service: Endoscopy;  Laterality: N/A;   CYSTECTOMY  1970   pilonidal    DILATION AND CURETTAGE OF UTERUS     ESOPHAGOGASTRODUODENOSCOPY (EGD) WITH PROPOFOL N/A 07/06/2018   Procedure: ESOPHAGOGASTRODUODENOSCOPY (EGD) WITH PROPOFOL;  Surgeon: Vida Rigger, MD;  Location: [facility] ENDOSCOPY;  Service: Endoscopy;  Laterality: N/A;   EYE SURGERY Bilateral 07/2013   cataracts   HOT HEMOSTASIS N/A 07/06/2018   Procedure: HOT HEMOSTASIS (ARGON PLASMA COAGULATION/BICAP);  Surgeon: Vida Rigger, MD;  Location: [facility] ENDOSCOPY;  Service: Endoscopy;  Laterality: N/A;   implantable loop recorder  placement  01/15/2022   [employer] Reveal Linq model [id number] implantable loop recorder (SN [id number]) implanted for AF management by Dr Johney Frame   MAXIMUM ACCESS (MAS)POSTERIOR LUMBAR INTERBODY FUSION (PLIF) 2 LEVEL N/A 09/22/2013   Procedure: FOR MAXIMUM ACCESS  POSTERIOR LUMBAR INTERBODY FUSION LUMBAR THREE-FOUR FOUR-FIVE;  Surgeon: Tia Alert, MD;  Location: [facility] NEURO ORS;  Service: Neurosurgery;  Laterality: N/A;   OOPHORECTOMY     wedge section not rem   REPLACEMENT TOTAL KNEE Left 2011   REPLACEMENT TOTAL KNEE     RIGHT/LEFT HEART CATH AND CORONARY ANGIOGRAPHY N/A 10/26/2020   Procedure: RIGHT/LEFT HEART CATH AND CORONARY ANGIOGRAPHY;  Surgeon: Marykay Lex, MD;  Location: [facility] INVASIVE CV LAB;  Service:  Cardiovascular;  Laterality: N/A;   TEE WITHOUT CARDIOVERSION N/A 07/07/2018   Procedure: TRANSESOPHAGEAL ECHOCARDIOGRAM (TEE);  Surgeon: Chilton Si, MD;  Location: [facility] ENDOSCOPY;  Service: Cardiovascular;  Laterality: N/A;   TEE WITHOUT CARDIOVERSION N/A 09/21/2018   Procedure: TRANSESOPHAGEAL ECHOCARDIOGRAM (TEE);  Surgeon: Elease Hashimoto Deloris Ping, MD;  Location: [facility] ENDOSCOPY;  Service: Cardiovascular;  Laterality: N/A;   TONSILLECTOMY     TUBAL LIGATION     Patient Active Problem List   Diagnosis Date Noted   Hyperlipidemia 09/25/2023   Shortness of breath    Unstable angina (HCC) 10/23/2020   Hypercoagulable state due to paroxysmal atrial fibrillation (HCC) 10/15/2020   Encounter for therapeutic drug monitoring 08/17/2018   CKD (chronic kidney disease) stage 3, GFR 30-59 ml/min (HCC) 07/21/2018   Chronic anemia 07/21/2018   Anemia    Heme positive stool    Atrial fibrillation with RVR (HCC) 07/04/2018   Paroxysmal atrial fibrillation (HCC) 07/02/2018   Carotid stenosis    Bruit of left carotid artery 09/22/2017   Aortic stenosis    Heart murmur 09/09/2016   Morbid obesity (HCC) 02/05/2016   Dry eye 05/17/2015   Type 2 diabetes mellitus without complication (HCC) 08/10/2014   Breast cancer, left breast (HCC) 07/22/2014   Obesity, Class III, BMI 40-49.9 (morbid obesity) (HCC) 07/06/2014   Macular pseudohole 05/17/2014   History of surgical procedure 05/17/2014   OSA (obstructive sleep apnea) 11/17/2013   S/P lumbar spinal fusion 09/22/2013   Persistent atrial fibrillation (HCC) 09/11/2011    Class: Acute   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HCC) 09/11/2011    Class: Chronic   Essential hypertension 09/11/2011   Exogenous obesity 09/11/2011   Gastroesophageal reflux disease 09/11/2011   Thalassemia minor 09/11/2011    Class: Chronic   Breast cancer (HCC) 09/11/2011    Class: Chronic   Degenerative joint disease 09/11/2011    Class: Chronic   Irritable bowel disease 09/11/2011     Class: Chronic   Lumbar disc disease 09/11/2011    PCP: Margaretann Loveless, MD  REFERRING PROVIDER: Baird Lyons, DO  REFERRING DIAG: chronic bag and knee pain  Rationale for Evaluation and Treatment: Rehabilitation  THERAPY DIAG:  Other low back pain  Difficulty in walking, not elsewhere classified  Muscle spasm of back  ONSET DATE: 10/14/22  SUBJECTIVE:  SUBJECTIVE STATEMENT:  Pt arrives with c/o leg pain which is some better than last week and she does believe is related to statins. No current c/o LBP.LE spasms very bad    Patient reports that she feels that she is unsteady on her feet and walking.  She did have a fall this AM.  She reports that her feet got caught in the bed spread and she fell, she reports that had pain in the knees and the back before but now having an increase of LBP.  She has a history of low back surgery fusion L3-5  PERTINENT HISTORY:  See above  PAIN:  Are you having pain? 7-8/10 in the low back, worse with standing , walking and activity, sit and rest pain can be a 5/10 but it is always there, ache, feels weak int eh core, reports knees are sore today after she had a fall  PRECAUTIONS: Other: lumbar fusion 2015  WEIGHT BEARING RESTRICTIONS: No  FALLS:  Has patient fallen in last 6 months? Yes, one fall this AM  LIVING ENVIRONMENT: Lives with: lives with their family Lives in: House/apartment Stairs: Yes: Internal: 15 steps; can reach both Has following equipment at home: has a cane and uses at times  OCCUPATION: retired  PLOF: Independent and gardens, does Presenter, broadcasting, cooks and Lexicographer, shops  PATIENT GOALS: strengthen core and have less pain  NEXT MD VISIT:   OBJECTIVE:   DIAGNOSTIC FINDINGS:  No recent x-rays   PATIENT SURVEYS:  ODI = 54%  disability  COGNITION: Overall cognitive status: Within functional limits for tasks assessed     SENSATION: WFL  MUSCLE LENGTH: Very tight HS, piriformis and quads  POSTURE: rounded shoulders and forward head  PALPATION: Tender and tight in the low back mms, tightness iin the rhomboids and the upper traps, very sore in the right low back and into the buttock   LUMBAR ROM:  all motions increased right low back pain  AROM eval  Flexion Decreased 50%  Extension Decreased 100%  Right lateral flexion Decreased 75%  Left lateral flexion Decreased 75%  Right rotation   Left rotation    (Blank rows = not tested)   Shoulder ROM left : AROM   Flexion: 100, abduction 95, ER 30, IR 40 all increase pain and she is very painful with ER and abduction LOWER EXTREMITY ROM:     Active  Right eval Left eval  Hip flexion    Hip extension    Hip abduction    Hip adduction    Hip internal rotation    Hip external rotation    Knee flexion 10 5  Knee extension 95 100  Ankle dorsiflexion    Ankle plantarflexion    Ankle inversion    Ankle eversion     (Blank rows = not tested)  LOWER EXTREMITY MMT:    MMT Right eval Left eval  Hip flexion 4- 4-  Hip extension 4- 4-  Hip abduction 4- 4-  Hip adduction    Hip internal rotation    Hip external rotation    Knee flexion 3+ 4-  Knee extension 3+ 4-  Ankle dorsiflexion 4 4  Ankle plantarflexion    Ankle inversion    Ankle eversion     (Blank rows = not tested)  FUNCTIONAL TESTS:  ODI= 27/50 54% TUG:  33 seconds  GAIT: Distance walked: 80 feet Assistive device utilized: no device Level of assistance: Complete Independence Comments: slow, right foot turned out, limp on  the right  TODAY'S TREATMENT:                                                                                                                              DATE:    11/09/23 Nustep L 5 8 min STS from mat with wt ball press 10 x Ball b/n knees squeeze  20x Green tband clamshells 2 sets 10 Green tband hip flex  2 sets 10 Seated red tband row and extension Black tband trunk flex and ext 2 sets 10 LAQ 2 sets 10 Green tband HS curl 2 sets 10        10/28/23 Nustep level 5 x 7 minutes Feet on ball K2C, trunk rotation, small bridge, isometric abs 20# leg curls Ball b/n knees squeeze Green tband clamshells Passive stretch of the LE's Seated red tband row and extension STM to the buttock and back  10/13/23 Nustep L 5 10 min- 2 rest needed for SOB/fatigue Black tband trunk flex and ext 2 sets 10 Seated row 20# 2 sets 10 LAQ , hip flex and hip abd # 2 sets 10 ADD ball squeeze 2 sets 10 Red tband HS curl 2 sets 10 Gentle stretching of LEs and trunk   09/30/23 Evaluation   PATIENT EDUCATION:  Education details: POC Person educated: Patient Education method: Explanation Education comprehension: verbalized understanding  HOME EXERCISE PROGRAM: TBD  ASSESSMENT:  CLINICAL IMPRESSION:  Pt arrives with c/o leg pain which is some better than last week and she does believe is related to statins. No current c/o LBP.LE spasms very bad . STG partially met. LTG 1 and 2 met.   OBJECTIVE IMPAIRMENTS: Abnormal gait, cardiopulmonary status limiting activity, decreased activity tolerance, decreased balance, decreased coordination, decreased endurance, decreased mobility, difficulty walking, decreased ROM, decreased strength, increased fascial restrictions, impaired perceived functional ability, increased muscle spasms, impaired flexibility, improper body mechanics, and pain.   REHAB POTENTIAL: Good  CLINICAL DECISION MAKING: Stable/uncomplicated  EVALUATION COMPLEXITY: Low   GOALS: Goals reviewed with patient? Yes  SHORT TERM GOALS: Target date: 10/24/23  Independent with initial HEP Goal status: progressing 10/28/23  and 11/09/23  LONG TERM GOALS: Target date: 12/28/23  Independent wiht posture and body mechanics Goal status: MET  11/09/23  2.  Decrease TUG time to 15 seconds Goal status: MET 11/09/23  3.  Decrease pain 25% Goal status: INITIAL  4.  Be able to cook a meal without pain > 5/10 Goal status: INITIAL  5.  Independent with HEP or gym Goal status: INITIAL  PLAN:  PT FREQUENCY: 1-2x/week  PT DURATION: 12 weeks  PLANNED INTERVENTIONS: Therapeutic exercises, Therapeutic activity, Neuromuscular re-education, Balance training, Gait training, Patient/Family education, Self Care, Joint mobilization, Dry Needling, Electrical stimulation, Cryotherapy, Moist heat, and Manual therapy.  PLAN FOR NEXT SESSION: progress HEP   Kawanna Christley,ANGIE, PTA 11/09/2023, 8:32 AM [facility] [address] [facility] [place], [place], [address] Phone: [phone]  Fax:  [phone] [facility] at [facility] [address] [place], [place], [address] Phone: [phone]   Fax:  [phone]  Patient Details  Name: Jenna Mcdonald MRN: [id number] Date of Birth: May 28, 1946 Referring Provider:  Thana Ates, MD  Encounter Date: 11/09/2023

## 2023-11-19 ENCOUNTER — Encounter: Payer: Self-pay | Admitting: Physical Therapy

## 2023-11-19 ENCOUNTER — Ambulatory Visit: Admitting: Physical Therapy

## 2023-11-19 DIAGNOSIS — R262 Difficulty in walking, not elsewhere classified: Secondary | ICD-10-CM | POA: Diagnosis not present

## 2023-11-19 DIAGNOSIS — M62830 Muscle spasm of back: Secondary | ICD-10-CM | POA: Diagnosis not present

## 2023-11-19 DIAGNOSIS — M5459 Other low back pain: Secondary | ICD-10-CM

## 2023-11-19 DIAGNOSIS — M25561 Pain in right knee: Secondary | ICD-10-CM | POA: Diagnosis not present

## 2023-11-19 DIAGNOSIS — R2681 Unsteadiness on feet: Secondary | ICD-10-CM | POA: Diagnosis not present

## 2023-11-19 DIAGNOSIS — G8929 Other chronic pain: Secondary | ICD-10-CM

## 2023-11-19 DIAGNOSIS — M25512 Pain in left shoulder: Secondary | ICD-10-CM | POA: Diagnosis not present

## 2023-11-19 NOTE — Therapy (Signed)
 OUTPATIENT PHYSICAL THERAPY THORACOLUMBAR    Patient Name: Jenna Mcdonald MRN: [id number] DOB:Oct 06, 1945, 78 y.o., female Today's Date: 11/19/2023  END OF SESSION:  PT End of Session - 11/19/23 1530     Visit Number 5    Number of Visits 13    Date for PT Re-Evaluation 01/13/24    Authorization Type [employer] 4/13    PT Start Time 1530    PT Stop Time 1615    PT Time Calculation (min) 45 min    Activity Tolerance Patient tolerated treatment well    Behavior During Therapy [facility] for tasks assessed/performed              Past Medical History:  Diagnosis Date   Anemia    Aortic stenosis    mild by echo 09/2020 and 08/2022 with mean AVG   Breast CA (HCC)    left   Breast cancer (HCC)    Carotid stenosis    1-39% left   Degenerative disc disease, lumbar    of the knees   Diabetes mellitus    Dysrhythmia    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HCC)    Encounter for therapeutic drug monitoring 08/17/2018   Fatigue    Severe secondary to to gemfibrozil   Gastroesophageal reflux disease    denies   GI bleeding    a. 06/2018: EGD showed multiple nonbleeding duodenal ulcers. 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   Hammertoe    left second toe   Hypercholesteremia    Hypertension    Insomnia    Irritable bowel    Low back pain    Metabolic syndrome    Morbid obesity (HCC)    OSA on CPAP \   bipap   Osteoarthritis    Osteopenia    Persistent atrial fibrillation [facility])    s/p afib ablation - Dr. Johney Frame 08/2018   Personal history of radiation therapy    Pneumonia    PONV (postoperative nausea and vomiting)    Sigmoid diverticulosis    Thalassemia minor    Unstable angina (HCC) 10/23/2020   Vitamin D deficiency    Past Surgical History:  Procedure Laterality Date   ATRIAL FIBRILLATION ABLATION N/A 09/21/2018   Procedure: ATRIAL FIBRILLATION ABLATION;  Surgeon: Hillis Range, MD;  Location: [facility]  INVASIVE CV LAB;  Service: Cardiovascular;  Laterality: N/A;   BIOPSY  07/06/2018   Procedure: BIOPSY;  Surgeon: Vida Rigger, MD;  Location: [facility] ENDOSCOPY;  Service: Endoscopy;;   BREAST LUMPECTOMY Left 05/28/2009   BREAST SURGERY Left 2010   lumpectomy   CARDIOVERSION N/A 07/07/2018   Procedure: CARDIOVERSION;  Surgeon: Chilton Si, MD;  Location: [facility] ENDOSCOPY;  Service: Cardiovascular;  Laterality: N/A;   CARDIOVERSION N/A 07/14/2018   Procedure: CARDIOVERSION;  Surgeon: Laurey Morale, MD;  Location: [facility] ENDOSCOPY;  Service: Cardiovascular;  Laterality: N/A;   CESAREAN SECTION     COLONOSCOPY WITH PROPOFOL N/A 07/06/2018   Procedure: COLONOSCOPY WITH PROPOFOL;  Surgeon: Vida Rigger, MD;  Location: [facility] ENDOSCOPY;  Service: Endoscopy;  Laterality: N/A;   CYSTECTOMY  1970   pilonidal    DILATION AND CURETTAGE OF UTERUS     ESOPHAGOGASTRODUODENOSCOPY (EGD) WITH PROPOFOL N/A 07/06/2018   Procedure: ESOPHAGOGASTRODUODENOSCOPY (EGD) WITH PROPOFOL;  Surgeon: Vida Rigger, MD;  Location: [facility] ENDOSCOPY;  Service: Endoscopy;  Laterality: N/A;   EYE SURGERY Bilateral 07/2013   cataracts   HOT HEMOSTASIS N/A 07/06/2018   Procedure: HOT HEMOSTASIS (ARGON PLASMA COAGULATION/BICAP);  Surgeon: Vida Rigger, MD;  Location: [facility] ENDOSCOPY;  Service: Endoscopy;  Laterality: N/A;   implantable loop recorder placement  01/15/2022   [employer] Reveal Linq model [id number] implantable loop recorder (SN [id number]) implanted for AF management by Dr Johney Frame   MAXIMUM ACCESS (MAS)POSTERIOR LUMBAR INTERBODY FUSION (PLIF) 2 LEVEL N/A 09/22/2013   Procedure: FOR MAXIMUM ACCESS  POSTERIOR LUMBAR INTERBODY FUSION LUMBAR THREE-FOUR FOUR-FIVE;  Surgeon: Tia Alert, MD;  Location: [facility] NEURO ORS;  Service: Neurosurgery;  Laterality: N/A;   OOPHORECTOMY     wedge section not rem   REPLACEMENT TOTAL KNEE Left 2011   REPLACEMENT TOTAL KNEE     RIGHT/LEFT HEART CATH AND CORONARY ANGIOGRAPHY N/A 10/26/2020   Procedure:  RIGHT/LEFT HEART CATH AND CORONARY ANGIOGRAPHY;  Surgeon: Marykay Lex, MD;  Location: [facility] INVASIVE CV LAB;  Service: Cardiovascular;  Laterality: N/A;   TEE WITHOUT CARDIOVERSION N/A 07/07/2018   Procedure: TRANSESOPHAGEAL ECHOCARDIOGRAM (TEE);  Surgeon: Chilton Si, MD;  Location: [facility] ENDOSCOPY;  Service: Cardiovascular;  Laterality: N/A;   TEE WITHOUT CARDIOVERSION N/A 09/21/2018   Procedure: TRANSESOPHAGEAL ECHOCARDIOGRAM (TEE);  Surgeon: Elease Hashimoto Deloris Ping, MD;  Location: [facility] ENDOSCOPY;  Service: Cardiovascular;  Laterality: N/A;   TONSILLECTOMY     TUBAL LIGATION     Patient Active Problem List   Diagnosis Date Noted   Hyperlipidemia 09/25/2023   Shortness of breath    Unstable angina (HCC) 10/23/2020   Hypercoagulable state due to paroxysmal atrial fibrillation (HCC) 10/15/2020   Encounter for therapeutic drug monitoring 08/17/2018   CKD (chronic kidney disease) stage 3, GFR 30-59 ml/min (HCC) 07/21/2018   Chronic anemia 07/21/2018   Anemia    Heme positive stool    Atrial fibrillation with RVR (HCC) 07/04/2018   Paroxysmal atrial fibrillation (HCC) 07/02/2018   Carotid stenosis    Bruit of left carotid artery 09/22/2017   Aortic stenosis    Heart murmur 09/09/2016   Morbid obesity (HCC) 02/05/2016   Dry eye 05/17/2015   Type 2 diabetes mellitus without complication (HCC) 08/10/2014   Breast cancer, left breast (HCC) 07/22/2014   Obesity, Class III, BMI 40-49.9 (morbid obesity) (HCC) 07/06/2014   Macular pseudohole 05/17/2014   History of surgical procedure 05/17/2014   OSA (obstructive sleep apnea) 11/17/2013   S/P lumbar spinal fusion 09/22/2013   Persistent atrial fibrillation (HCC) 09/11/2011    Class: Acute   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HCC) 09/11/2011    Class: Chronic   Essential hypertension 09/11/2011   Exogenous obesity 09/11/2011   Gastroesophageal reflux disease 09/11/2011   Thalassemia minor 09/11/2011    Class: Chronic   Breast cancer (HCC) 09/11/2011     Class: Chronic   Degenerative joint disease 09/11/2011    Class: Chronic   Irritable bowel disease 09/11/2011    Class: Chronic   Lumbar disc disease 09/11/2011    PCP: Margaretann Loveless, MD  REFERRING PROVIDER: Baird Lyons, DO  REFERRING DIAG: chronic bag and knee pain  Rationale for Evaluation and Treatment: Rehabilitation  THERAPY DIAG:  Other low back pain  Difficulty in walking, not elsewhere classified  Muscle spasm of back  Unsteadiness on feet  Acute pain of right knee  Chronic left shoulder pain  ONSET DATE: 10/14/22  SUBJECTIVE:  SUBJECTIVE STATEMENT:  Reports that she is still really hurting from the back down, rated a 7/10   Patient reports that she feels that she is unsteady on her feet and walking.  She did have a fall this AM.  She reports that her feet got caught in the bed spread and she fell, she reports that had pain in the knees and the back before but now having an increase of LBP.  She has a history of low back surgery fusion L3-5  PERTINENT HISTORY:  See above  PAIN:  Are you having pain? 7-8/10 in the low back, worse with standing , walking and activity, sit and rest pain can be a 5/10 but it is always there, ache, feels weak int eh core, reports knees are sore today after she had a fall  PRECAUTIONS: Other: lumbar fusion 2015  WEIGHT BEARING RESTRICTIONS: No  FALLS:  Has patient fallen in last 6 months? Yes, one fall this AM  LIVING ENVIRONMENT: Lives with: lives with their family Lives in: House/apartment Stairs: Yes: Internal: 15 steps; can reach both Has following equipment at home: has a cane and uses at times  OCCUPATION: retired  PLOF: Independent and gardens, does Presenter, broadcasting, cooks and Lexicographer, shops  PATIENT GOALS: strengthen core and have less pain  NEXT  MD VISIT:   OBJECTIVE:   DIAGNOSTIC FINDINGS:  No recent x-rays   PATIENT SURVEYS:  ODI = 54% disability  COGNITION: Overall cognitive status: Within functional limits for tasks assessed     SENSATION: WFL  MUSCLE LENGTH: Very tight HS, piriformis and quads  POSTURE: rounded shoulders and forward head  PALPATION: Tender and tight in the low back mms, tightness iin the rhomboids and the upper traps, very sore in the right low back and into the buttock   LUMBAR ROM:  all motions increased right low back pain  AROM eval  Flexion Decreased 50%  Extension Decreased 100%  Right lateral flexion Decreased 75%  Left lateral flexion Decreased 75%  Right rotation   Left rotation    (Blank rows = not tested)   Shoulder ROM left : AROM   Flexion: 100, abduction 95, ER 30, IR 40 all increase pain and she is very painful with ER and abduction LOWER EXTREMITY ROM:     Active  Right eval Left eval  Hip flexion    Hip extension    Hip abduction    Hip adduction    Hip internal rotation    Hip external rotation    Knee flexion 10 5  Knee extension 95 100  Ankle dorsiflexion    Ankle plantarflexion    Ankle inversion    Ankle eversion     (Blank rows = not tested)  LOWER EXTREMITY MMT:    MMT Right eval Left eval  Hip flexion 4- 4-  Hip extension 4- 4-  Hip abduction 4- 4-  Hip adduction    Hip internal rotation    Hip external rotation    Knee flexion 3+ 4-  Knee extension 3+ 4-  Ankle dorsiflexion 4 4  Ankle plantarflexion    Ankle inversion    Ankle eversion     (Blank rows = not tested)  FUNCTIONAL TESTS:  ODI= 27/50 54% TUG:  33 seconds  GAIT: Distance walked: 80 feet Assistive device utilized: no device Level of assistance: Complete Independence Comments: slow, right foot turned out, limp on the right  TODAY'S TREATMENT:  DATE:   11/19/23 Nustep level 5 x 7 minutes Standing red tband row and extension Feet on ball K2C, small rotation, small bridge and isometric abs Red tband clamshells Ball b/n knees squeeze LE stretches STM to the buttocks and the low back  11/09/23 Nustep L 5 8 min STS from mat with wt ball press 10 x Ball b/n knees squeeze 20x Green tband clamshells 2 sets 10 Green tband hip flex  2 sets 10 Seated red tband row and extension Black tband trunk flex and ext 2 sets 10 LAQ 2 sets 10 Green tband HS curl 2 sets 10  10/28/23 Nustep level 5 x 7 minutes Feet on ball K2C, trunk rotation, small bridge, isometric abs 20# leg curls Ball b/n knees squeeze Green tband clamshells Passive stretch of the LE's Seated red tband row and extension STM to the buttock and back  10/13/23 Nustep L 5 10 min- 2 rest needed for SOB/fatigue Black tband trunk flex and ext 2 sets 10 Seated row 20# 2 sets 10 LAQ , hip flex and hip abd # 2 sets 10 ADD ball squeeze 2 sets 10 Red tband HS curl 2 sets 10 Gentle stretching of LEs and trunk   09/30/23 Evaluation   PATIENT EDUCATION:  Education details: POC Person educated: Patient Education method: Explanation Education comprehension: verbalized understanding  HOME EXERCISE PROGRAM: TBD  ASSESSMENT:  CLINICAL IMPRESSION:  Pt arrives with c/o leg and back pain.  REports that she is unsure of why she is having the pain, reports that she is tight in the legs and has not done walking and has not done much stretching due to the pain   OBJECTIVE IMPAIRMENTS: Abnormal gait, cardiopulmonary status limiting activity, decreased activity tolerance, decreased balance, decreased coordination, decreased endurance, decreased mobility, difficulty walking, decreased ROM, decreased strength, increased fascial restrictions, impaired perceived functional ability, increased muscle spasms, impaired flexibility, improper body mechanics, and pain.   REHAB POTENTIAL:  Good  CLINICAL DECISION MAKING: Stable/uncomplicated  EVALUATION COMPLEXITY: Low   GOALS: Goals reviewed with patient? Yes  SHORT TERM GOALS: Target date: 10/24/23  Independent with initial HEP Goal status: progressing 10/28/23  and 11/09/23  LONG TERM GOALS: Target date: 12/28/23  Independent wiht posture and body mechanics Goal status: MET 11/09/23  2.  Decrease TUG time to 15 seconds Goal status: MET 11/09/23  3.  Decrease pain 25% Goal status: ongoing 11/19/23  4.  Be able to cook a meal without pain > 5/10 Goal status: ongoing 11/19/23  5.  Independent with HEP or gym Goal status: INITIAL  PLAN:  PT FREQUENCY: 1-2x/week  PT DURATION: 12 weeks  PLANNED INTERVENTIONS: Therapeutic exercises, Therapeutic activity, Neuromuscular re-education, Balance training, Gait training, Patient/Family education, Self Care, Joint mobilization, Dry Needling, Electrical stimulation, Cryotherapy, Moist heat, and Manual therapy.  PLAN FOR NEXT SESSION: progress HEP and activity   Jearld Lesch, PT 11/19/2023, 3:33 PM [place] [facility] [address] [place], [place], [address] Phone: [phone]   Fax:  [phone]   Encounter Date: 11/19/2023

## 2023-11-24 ENCOUNTER — Encounter: Payer: Self-pay | Admitting: Physical Therapy

## 2023-11-24 ENCOUNTER — Ambulatory Visit: Attending: Internal Medicine | Admitting: Physical Therapy

## 2023-11-24 DIAGNOSIS — R2681 Unsteadiness on feet: Secondary | ICD-10-CM | POA: Diagnosis not present

## 2023-11-24 DIAGNOSIS — M5459 Other low back pain: Secondary | ICD-10-CM

## 2023-11-24 DIAGNOSIS — M62830 Muscle spasm of back: Secondary | ICD-10-CM | POA: Diagnosis not present

## 2023-11-24 DIAGNOSIS — R262 Difficulty in walking, not elsewhere classified: Secondary | ICD-10-CM | POA: Diagnosis not present

## 2023-11-24 DIAGNOSIS — M25561 Pain in right knee: Secondary | ICD-10-CM

## 2023-11-24 NOTE — Therapy (Signed)
 OUTPATIENT PHYSICAL THERAPY THORACOLUMBAR    Patient Name: Jenna Mcdonald MRN: [id number] DOB:09-Feb-1946, 78 y.o., female Today's Date: 11/24/2023  END OF SESSION:  PT End of Session - 11/24/23 1109     Visit Number 6    Number of Visits 13    Date for PT Re-Evaluation 01/13/24    Authorization Type [employer] 5/13    PT Start Time 1055    PT Stop Time 1143    PT Time Calculation (min) 48 min    Activity Tolerance Patient tolerated treatment well    Behavior During Therapy [facility] for tasks assessed/performed              Past Medical History:  Diagnosis Date   Anemia    Aortic stenosis    mild by echo 09/2020 and 08/2022 with mean AVG   Breast CA (HCC)    left   Breast cancer (HCC)    Carotid stenosis    1-39% left   Degenerative disc disease, lumbar    of the knees   Diabetes mellitus    Dysrhythmia    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HCC)    Encounter for therapeutic drug monitoring 08/17/2018   Fatigue    Severe secondary to to gemfibrozil   Gastroesophageal reflux disease    denies   GI bleeding    a. 06/2018: EGD showed multiple nonbleeding duodenal ulcers. 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   Hammertoe    left second toe   Hypercholesteremia    Hypertension    Insomnia    Irritable bowel    Low back pain    Metabolic syndrome    Morbid obesity (HCC)    OSA on CPAP \   bipap   Osteoarthritis    Osteopenia    Persistent atrial fibrillation [facility])    s/p afib ablation - Dr. Johney Frame 08/2018   Personal history of radiation therapy    Pneumonia    PONV (postoperative nausea and vomiting)    Sigmoid diverticulosis    Thalassemia minor    Unstable angina (HCC) 10/23/2020   Vitamin D deficiency    Past Surgical History:  Procedure Laterality Date   ATRIAL FIBRILLATION ABLATION N/A 09/21/2018   Procedure: ATRIAL FIBRILLATION ABLATION;  Surgeon: Hillis Range, MD;  Location: [facility] INVASIVE  CV LAB;  Service: Cardiovascular;  Laterality: N/A;   BIOPSY  07/06/2018   Procedure: BIOPSY;  Surgeon: Vida Rigger, MD;  Location: [facility] ENDOSCOPY;  Service: Endoscopy;;   BREAST LUMPECTOMY Left 05/28/2009   BREAST SURGERY Left 2010   lumpectomy   CARDIOVERSION N/A 07/07/2018   Procedure: CARDIOVERSION;  Surgeon: Chilton Si, MD;  Location: [facility] ENDOSCOPY;  Service: Cardiovascular;  Laterality: N/A;   CARDIOVERSION N/A 07/14/2018   Procedure: CARDIOVERSION;  Surgeon: Laurey Morale, MD;  Location: [facility] ENDOSCOPY;  Service: Cardiovascular;  Laterality: N/A;   CESAREAN SECTION     COLONOSCOPY WITH PROPOFOL N/A 07/06/2018   Procedure: COLONOSCOPY WITH PROPOFOL;  Surgeon: Vida Rigger, MD;  Location: [facility] ENDOSCOPY;  Service: Endoscopy;  Laterality: N/A;   CYSTECTOMY  1970   pilonidal    DILATION AND CURETTAGE OF UTERUS     ESOPHAGOGASTRODUODENOSCOPY (EGD) WITH PROPOFOL N/A 07/06/2018   Procedure: ESOPHAGOGASTRODUODENOSCOPY (EGD) WITH PROPOFOL;  Surgeon: Vida Rigger, MD;  Location: [facility] ENDOSCOPY;  Service: Endoscopy;  Laterality: N/A;   EYE SURGERY Bilateral 07/2013   cataracts   HOT HEMOSTASIS N/A 07/06/2018   Procedure: HOT HEMOSTASIS (ARGON PLASMA COAGULATION/BICAP);  Surgeon: Vida Rigger, MD;  Location: [facility] ENDOSCOPY;  Service: Endoscopy;  Laterality: N/A;   implantable loop recorder placement  01/15/2022   [employer] Reveal Linq model [id number] implantable loop recorder (SN [id number]) implanted for AF management by Dr Johney Frame   MAXIMUM ACCESS (MAS)POSTERIOR LUMBAR INTERBODY FUSION (PLIF) 2 LEVEL N/A 09/22/2013   Procedure: FOR MAXIMUM ACCESS  POSTERIOR LUMBAR INTERBODY FUSION LUMBAR THREE-FOUR FOUR-FIVE;  Surgeon: Tia Alert, MD;  Location: [facility] NEURO ORS;  Service: Neurosurgery;  Laterality: N/A;   OOPHORECTOMY     wedge section not rem   REPLACEMENT TOTAL KNEE Left 2011   REPLACEMENT TOTAL KNEE     RIGHT/LEFT HEART CATH AND CORONARY ANGIOGRAPHY N/A 10/26/2020   Procedure: RIGHT/LEFT HEART  CATH AND CORONARY ANGIOGRAPHY;  Surgeon: Marykay Lex, MD;  Location: [facility] INVASIVE CV LAB;  Service: Cardiovascular;  Laterality: N/A;   TEE WITHOUT CARDIOVERSION N/A 07/07/2018   Procedure: TRANSESOPHAGEAL ECHOCARDIOGRAM (TEE);  Surgeon: Chilton Si, MD;  Location: [facility] ENDOSCOPY;  Service: Cardiovascular;  Laterality: N/A;   TEE WITHOUT CARDIOVERSION N/A 09/21/2018   Procedure: TRANSESOPHAGEAL ECHOCARDIOGRAM (TEE);  Surgeon: Elease Hashimoto Deloris Ping, MD;  Location: [facility] ENDOSCOPY;  Service: Cardiovascular;  Laterality: N/A;   TONSILLECTOMY     TUBAL LIGATION     Patient Active Problem List   Diagnosis Date Noted   Hyperlipidemia 09/25/2023   Shortness of breath    Unstable angina (HCC) 10/23/2020   Hypercoagulable state due to paroxysmal atrial fibrillation (HCC) 10/15/2020   Encounter for therapeutic drug monitoring 08/17/2018   CKD (chronic kidney disease) stage 3, GFR 30-59 ml/min (HCC) 07/21/2018   Chronic anemia 07/21/2018   Anemia    Heme positive stool    Atrial fibrillation with RVR (HCC) 07/04/2018   Paroxysmal atrial fibrillation (HCC) 07/02/2018   Carotid stenosis    Bruit of left carotid artery 09/22/2017   Aortic stenosis    Heart murmur 09/09/2016   Morbid obesity (HCC) 02/05/2016   Dry eye 05/17/2015   Type 2 diabetes mellitus without complication (HCC) 08/10/2014   Breast cancer, left breast (HCC) 07/22/2014   Obesity, Class III, BMI 40-49.9 (morbid obesity) (HCC) 07/06/2014   Macular pseudohole 05/17/2014   History of surgical procedure 05/17/2014   OSA (obstructive sleep apnea) 11/17/2013   S/P lumbar spinal fusion 09/22/2013   Persistent atrial fibrillation (HCC) 09/11/2011    Class: Acute   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HCC) 09/11/2011    Class: Chronic   Essential hypertension 09/11/2011   Exogenous obesity 09/11/2011   Gastroesophageal reflux disease 09/11/2011   Thalassemia minor 09/11/2011    Class: Chronic   Breast cancer (HCC) 09/11/2011    Class: Chronic    Degenerative joint disease 09/11/2011    Class: Chronic   Irritable bowel disease 09/11/2011    Class: Chronic   Lumbar disc disease 09/11/2011    PCP: Margaretann Loveless, MD  REFERRING PROVIDER: Baird Lyons, DO  REFERRING DIAG: chronic bag and knee pain  Rationale for Evaluation and Treatment: Rehabilitation  THERAPY DIAG:  Other low back pain  Difficulty in walking, not elsewhere classified  Muscle spasm of back  Unsteadiness on feet  Acute pain of right knee  ONSET DATE: 10/14/22  SUBJECTIVE:  SUBJECTIVE STATEMENT:  Reports that she is still stiff and sore with all walking and ADL's  Patient reports that she feels that she is unsteady on her feet and walking.  She did have a fall this AM.  She reports that her feet got caught in the bed spread and she fell, she reports that had pain in the knees and the back before but now having an increase of LBP.  She has a history of low back surgery fusion L3-5  PERTINENT HISTORY:  See above  PAIN:  Are you having pain? 7-8/10 in the low back, worse with standing , walking and activity, sit and rest pain can be a 5/10 but it is always there, ache, feels weak int eh core, reports knees are sore today after she had a fall  PRECAUTIONS: Other: lumbar fusion 2015  WEIGHT BEARING RESTRICTIONS: No  FALLS:  Has patient fallen in last 6 months? Yes, one fall this AM  LIVING ENVIRONMENT: Lives with: lives with their family Lives in: House/apartment Stairs: Yes: Internal: 15 steps; can reach both Has following equipment at home: has a cane and uses at times  OCCUPATION: retired  PLOF: Independent and gardens, does Presenter, broadcasting, cooks and Lexicographer, shops  PATIENT GOALS: strengthen core and have less pain  NEXT MD VISIT:   OBJECTIVE:   DIAGNOSTIC FINDINGS:   No recent x-rays   PATIENT SURVEYS:  ODI = 54% disability  COGNITION: Overall cognitive status: Within functional limits for tasks assessed     SENSATION: WFL  MUSCLE LENGTH: Very tight HS, piriformis and quads  POSTURE: rounded shoulders and forward head  PALPATION: Tender and tight in the low back mms, tightness iin the rhomboids and the upper traps, very sore in the right low back and into the buttock   LUMBAR ROM:  all motions increased right low back pain  AROM eval  Flexion Decreased 50%  Extension Decreased 100%  Right lateral flexion Decreased 75%  Left lateral flexion Decreased 75%  Right rotation   Left rotation    (Blank rows = not tested)   Shoulder ROM left : AROM   Flexion: 100, abduction 95, ER 30, IR 40 all increase pain and she is very painful with ER and abduction LOWER EXTREMITY ROM:     Active  Right eval Left eval  Hip flexion    Hip extension    Hip abduction    Hip adduction    Hip internal rotation    Hip external rotation    Knee flexion 10 5  Knee extension 95 100  Ankle dorsiflexion    Ankle plantarflexion    Ankle inversion    Ankle eversion     (Blank rows = not tested)  LOWER EXTREMITY MMT:    MMT Right eval Left eval  Hip flexion 4- 4-  Hip extension 4- 4-  Hip abduction 4- 4-  Hip adduction    Hip internal rotation    Hip external rotation    Knee flexion 3+ 4-  Knee extension 3+ 4-  Ankle dorsiflexion 4 4  Ankle plantarflexion    Ankle inversion    Ankle eversion     (Blank rows = not tested)  FUNCTIONAL TESTS:  ODI= 27/50 54% TUG:  33 seconds  GAIT: Distance walked: 80 feet Assistive device utilized: no device Level of assistance: Complete Independence Comments: slow, right foot turned out, limp on the right  TODAY'S TREATMENT:  DATE:   11/24/23 Gait around the parking [place] 1  rest break SBA for curbs Nustep level 5 x 6 minutes Feet on ball K2C, rotation, small bridge, isometric abs Ball b/n knees squeeze Green tband clamshells in hooklying Passive stretch STM with the Tgun to the ITB and the HS  11/19/23 Nustep level 5 x 7 minutes Standing red tband row and extension Feet on ball K2C, small rotation, small bridge and isometric abs Red tband clamshells Ball b/n knees squeeze LE stretches STM to the buttocks and the low back  11/09/23 Nustep L 5 8 min STS from mat with wt ball press 10 x Ball b/n knees squeeze 20x Green tband clamshells 2 sets 10 Green tband hip flex  2 sets 10 Seated red tband row and extension Black tband trunk flex and ext 2 sets 10 LAQ 2 sets 10 Green tband HS curl 2 sets 10  10/28/23 Nustep level 5 x 7 minutes Feet on ball K2C, trunk rotation, small bridge, isometric abs 20# leg curls Ball b/n knees squeeze Green tband clamshells Passive stretch of the LE's Seated red tband row and extension STM to the buttock and back  10/13/23 Nustep L 5 10 min- 2 rest needed for SOB/fatigue Black tband trunk flex and ext 2 sets 10 Seated row 20# 2 sets 10 LAQ , hip flex and hip abd # 2 sets 10 ADD ball squeeze 2 sets 10 Red tband HS curl 2 sets 10 Gentle stretching of LEs and trunk   09/30/23 Evaluation   PATIENT EDUCATION:  Education details: POC Person educated: Patient Education method: Explanation Education comprehension: verbalized understanding  HOME EXERCISE PROGRAM: TBD  ASSESSMENT:  CLINICAL IMPRESSION:  Pt continues to c/o leg and back pain.  Reports that her legs are very stiff.  She does report not taking the statin any longer, I did suggest that she contact her MD and double check about medication and the dosage.  She was able to walk around the parking [place] but did have one rest break.  She is unsure of what helps and what makes it worse except she says she is stiff and sore after sitting  OBJECTIVE  IMPAIRMENTS: Abnormal gait, cardiopulmonary status limiting activity, decreased activity tolerance, decreased balance, decreased coordination, decreased endurance, decreased mobility, difficulty walking, decreased ROM, decreased strength, increased fascial restrictions, impaired perceived functional ability, increased muscle spasms, impaired flexibility, improper body mechanics, and pain.   REHAB POTENTIAL: Good  CLINICAL DECISION MAKING: Stable/uncomplicated  EVALUATION COMPLEXITY: Low   GOALS: Goals reviewed with patient? Yes  SHORT TERM GOALS: Target date: 10/24/23  Independent with initial HEP Goal status: progressing 10/28/23  and 11/09/23  LONG TERM GOALS: Target date: 12/28/23  Independent wiht posture and body mechanics Goal status: MET 11/09/23  2.  Decrease TUG time to 15 seconds Goal status: MET 11/09/23  3.  Decrease pain 25% Goal status: ongoing 11/19/23  4.  Be able to cook a meal without pain > 5/10 Goal status: ongoing 11/19/23  5.  Independent with HEP or gym Goal status: INITIAL  PLAN:  PT FREQUENCY: 1-2x/week  PT DURATION: 12 weeks  PLANNED INTERVENTIONS: Therapeutic exercises, Therapeutic activity, Neuromuscular re-education, Balance training, Gait training, Patient/Family education, Self Care, Joint mobilization, Dry Needling, Electrical stimulation, Cryotherapy, Moist heat, and Manual therapy.  PLAN FOR NEXT SESSION: progress HEP and activity, see if we can help her pain   Jearld Lesch, PT 11/24/2023, 11:09 AM [facility] [address] [place], [place], [address] Phone: [phone]   Fax:  [phone]   Encounter Date: 11/24/2023

## 2023-11-27 NOTE — Addendum Note (Signed)
 Addended by: Geralyn Flash D on: 11/27/2023 01:46 PM   Modules accepted: Orders

## 2023-11-27 NOTE — Progress Notes (Signed)
 [facility] Summary Report / Loop Recorder

## 2023-11-30 ENCOUNTER — Ambulatory Visit (INDEPENDENT_AMBULATORY_CARE_PROVIDER_SITE_OTHER): Payer: Medicare PPO

## 2023-11-30 DIAGNOSIS — I480 Paroxysmal atrial fibrillation: Secondary | ICD-10-CM

## 2023-12-01 ENCOUNTER — Encounter: Payer: Self-pay | Admitting: Physical Therapy

## 2023-12-01 ENCOUNTER — Ambulatory Visit: Admitting: Physical Therapy

## 2023-12-01 DIAGNOSIS — R2681 Unsteadiness on feet: Secondary | ICD-10-CM | POA: Diagnosis not present

## 2023-12-01 DIAGNOSIS — M25561 Pain in right knee: Secondary | ICD-10-CM

## 2023-12-01 DIAGNOSIS — M62830 Muscle spasm of back: Secondary | ICD-10-CM | POA: Diagnosis not present

## 2023-12-01 DIAGNOSIS — M5459 Other low back pain: Secondary | ICD-10-CM | POA: Diagnosis not present

## 2023-12-01 DIAGNOSIS — R262 Difficulty in walking, not elsewhere classified: Secondary | ICD-10-CM | POA: Diagnosis not present

## 2023-12-01 DIAGNOSIS — G4733 Obstructive sleep apnea (adult) (pediatric): Secondary | ICD-10-CM | POA: Diagnosis not present

## 2023-12-01 LAB — CUP PACEART REMOTE DEVICE CHECK
Date Time Interrogation Session: 20250406231232
Implantable Pulse Generator Implant Date: 20230524

## 2023-12-01 NOTE — Therapy (Signed)
 OUTPATIENT PHYSICAL THERAPY THORACOLUMBAR    Patient Name: Jenna Mcdonald MRN: [id number] DOB:28-Apr-1946, 78 y.o., female Today's Date: 12/01/2023  END OF SESSION:  PT End of Session - 12/01/23 1103     Visit Number 7    Number of Visits 13    Date for PT Re-Evaluation 01/13/24    Authorization Type [employer] 6/13    PT Start Time 1057    PT Stop Time 1144    PT Time Calculation (min) 47 min    Activity Tolerance Patient tolerated treatment well    Behavior During Therapy [facility] for tasks assessed/performed              Past Medical History:  Diagnosis Date   Anemia    Aortic stenosis    mild by echo 09/2020 and 08/2022 with mean AVG   Breast CA (HCC)    left   Breast cancer (HCC)    Carotid stenosis    1-39% left   Degenerative disc disease, lumbar    of the knees   Diabetes mellitus    Dysrhythmia    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HCC)    Encounter for therapeutic drug monitoring 08/17/2018   Fatigue    Severe secondary to to gemfibrozil   Gastroesophageal reflux disease    denies   GI bleeding    a. 06/2018: EGD showed multiple nonbleeding duodenal ulcers. 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   Hammertoe    left second toe   Hypercholesteremia    Hypertension    Insomnia    Irritable bowel    Low back pain    Metabolic syndrome    Morbid obesity (HCC)    OSA on CPAP \   bipap   Osteoarthritis    Osteopenia    Persistent atrial fibrillation [facility])    s/p afib ablation - Dr. Johney Frame 08/2018   Personal history of radiation therapy    Pneumonia    PONV (postoperative nausea and vomiting)    Sigmoid diverticulosis    Thalassemia minor    Unstable angina (HCC) 10/23/2020   Vitamin D deficiency    Past Surgical History:  Procedure Laterality Date   ATRIAL FIBRILLATION ABLATION N/A 09/21/2018   Procedure: ATRIAL FIBRILLATION ABLATION;  Surgeon: Hillis Range, MD;  Location: [facility] INVASIVE  CV LAB;  Service: Cardiovascular;  Laterality: N/A;   BIOPSY  07/06/2018   Procedure: BIOPSY;  Surgeon: Vida Rigger, MD;  Location: [facility] ENDOSCOPY;  Service: Endoscopy;;   BREAST LUMPECTOMY Left 05/28/2009   BREAST SURGERY Left 2010   lumpectomy   CARDIOVERSION N/A 07/07/2018   Procedure: CARDIOVERSION;  Surgeon: Chilton Si, MD;  Location: [facility] ENDOSCOPY;  Service: Cardiovascular;  Laterality: N/A;   CARDIOVERSION N/A 07/14/2018   Procedure: CARDIOVERSION;  Surgeon: Laurey Morale, MD;  Location: [facility] ENDOSCOPY;  Service: Cardiovascular;  Laterality: N/A;   CESAREAN SECTION     COLONOSCOPY WITH PROPOFOL N/A 07/06/2018   Procedure: COLONOSCOPY WITH PROPOFOL;  Surgeon: Vida Rigger, MD;  Location: [facility] ENDOSCOPY;  Service: Endoscopy;  Laterality: N/A;   CYSTECTOMY  1970   pilonidal    DILATION AND CURETTAGE OF UTERUS     ESOPHAGOGASTRODUODENOSCOPY (EGD) WITH PROPOFOL N/A 07/06/2018   Procedure: ESOPHAGOGASTRODUODENOSCOPY (EGD) WITH PROPOFOL;  Surgeon: Vida Rigger, MD;  Location: [facility] ENDOSCOPY;  Service: Endoscopy;  Laterality: N/A;   EYE SURGERY Bilateral 07/2013   cataracts   HOT HEMOSTASIS N/A 07/06/2018   Procedure: HOT HEMOSTASIS (ARGON PLASMA COAGULATION/BICAP);  Surgeon: Vida Rigger, MD;  Location: [facility] ENDOSCOPY;  Service: Endoscopy;  Laterality: N/A;   implantable loop recorder placement  01/15/2022   [employer] Reveal Linq model [id number] implantable loop recorder (SN [id number]) implanted for AF management by Dr Johney Frame   MAXIMUM ACCESS (MAS)POSTERIOR LUMBAR INTERBODY FUSION (PLIF) 2 LEVEL N/A 09/22/2013   Procedure: FOR MAXIMUM ACCESS  POSTERIOR LUMBAR INTERBODY FUSION LUMBAR THREE-FOUR FOUR-FIVE;  Surgeon: Tia Alert, MD;  Location: [facility] NEURO ORS;  Service: Neurosurgery;  Laterality: N/A;   OOPHORECTOMY     wedge section not rem   REPLACEMENT TOTAL KNEE Left 2011   REPLACEMENT TOTAL KNEE     RIGHT/LEFT HEART CATH AND CORONARY ANGIOGRAPHY N/A 10/26/2020   Procedure: RIGHT/LEFT HEART  CATH AND CORONARY ANGIOGRAPHY;  Surgeon: Marykay Lex, MD;  Location: [facility] INVASIVE CV LAB;  Service: Cardiovascular;  Laterality: N/A;   TEE WITHOUT CARDIOVERSION N/A 07/07/2018   Procedure: TRANSESOPHAGEAL ECHOCARDIOGRAM (TEE);  Surgeon: Chilton Si, MD;  Location: [facility] ENDOSCOPY;  Service: Cardiovascular;  Laterality: N/A;   TEE WITHOUT CARDIOVERSION N/A 09/21/2018   Procedure: TRANSESOPHAGEAL ECHOCARDIOGRAM (TEE);  Surgeon: Elease Hashimoto Deloris Ping, MD;  Location: [facility] ENDOSCOPY;  Service: Cardiovascular;  Laterality: N/A;   TONSILLECTOMY     TUBAL LIGATION     Patient Active Problem List   Diagnosis Date Noted   Hyperlipidemia 09/25/2023   Shortness of breath    Unstable angina (HCC) 10/23/2020   Hypercoagulable state due to paroxysmal atrial fibrillation (HCC) 10/15/2020   Encounter for therapeutic drug monitoring 08/17/2018   CKD (chronic kidney disease) stage 3, GFR 30-59 ml/min (HCC) 07/21/2018   Chronic anemia 07/21/2018   Anemia    Heme positive stool    Atrial fibrillation with RVR (HCC) 07/04/2018   Paroxysmal atrial fibrillation (HCC) 07/02/2018   Carotid stenosis    Bruit of left carotid artery 09/22/2017   Aortic stenosis    Heart murmur 09/09/2016   Morbid obesity (HCC) 02/05/2016   Dry eye 05/17/2015   Type 2 diabetes mellitus without complication (HCC) 08/10/2014   Breast cancer, left breast (HCC) 07/22/2014   Obesity, Class III, BMI 40-49.9 (morbid obesity) (HCC) 07/06/2014   Macular pseudohole 05/17/2014   History of surgical procedure 05/17/2014   OSA (obstructive sleep apnea) 11/17/2013   S/P lumbar spinal fusion 09/22/2013   Persistent atrial fibrillation (HCC) 09/11/2011    Class: Acute   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HCC) 09/11/2011    Class: Chronic   Essential hypertension 09/11/2011   Exogenous obesity 09/11/2011   Gastroesophageal reflux disease 09/11/2011   Thalassemia minor 09/11/2011    Class: Chronic   Breast cancer (HCC) 09/11/2011    Class: Chronic    Degenerative joint disease 09/11/2011    Class: Chronic   Irritable bowel disease 09/11/2011    Class: Chronic   Lumbar disc disease 09/11/2011    PCP: Margaretann Loveless, MD  REFERRING PROVIDER: Baird Lyons, DO  REFERRING DIAG: chronic bag and knee pain  Rationale for Evaluation and Treatment: Rehabilitation  THERAPY DIAG:  Other low back pain  Muscle spasm of back  Unsteadiness on feet  Difficulty in walking, not elsewhere classified  Acute pain of right knee  ONSET DATE: 10/14/22  SUBJECTIVE:  SUBJECTIVE STATEMENT:  Reports that she is feeling a little better, less stiff and sore, pain a 6/10  Patient reports that she feels that she is unsteady on her feet and walking.  She did have a fall this AM.  She reports that her feet got caught in the bed spread and she fell, she reports that had pain in the knees and the back before but now having an increase of LBP.  She has a history of low back surgery fusion L3-5  PERTINENT HISTORY:  See above  PAIN:  Are you having pain? 6/10 in the low back, worse with standing , walking and activity, sit and rest pain can be a 5/10 but it is always there, ache, feels weak int eh core, reports knees are sore today after she had a fall  PRECAUTIONS: Other: lumbar fusion 2015  WEIGHT BEARING RESTRICTIONS: No  FALLS:  Has patient fallen in last 6 months? Yes, one fall this AM  LIVING ENVIRONMENT: Lives with: lives with their family Lives in: House/apartment Stairs: Yes: Internal: 15 steps; can reach both Has following equipment at home: has a cane and uses at times  OCCUPATION: retired  PLOF: Independent and gardens, does Presenter, broadcasting, cooks and Lexicographer, shops  PATIENT GOALS: strengthen core and have less pain  NEXT MD VISIT:   OBJECTIVE:   DIAGNOSTIC  FINDINGS:  No recent x-rays   PATIENT SURVEYS:  ODI = 54% disability  COGNITION: Overall cognitive status: Within functional limits for tasks assessed     SENSATION: WFL  MUSCLE LENGTH: Very tight HS, piriformis and quads  POSTURE: rounded shoulders and forward head  PALPATION: Tender and tight in the low back mms, tightness iin the rhomboids and the upper traps, very sore in the right low back and into the buttock   LUMBAR ROM:  all motions increased right low back pain  AROM eval  Flexion Decreased 50%  Extension Decreased 100%  Right lateral flexion Decreased 75%  Left lateral flexion Decreased 75%  Right rotation   Left rotation    (Blank rows = not tested)   Shoulder ROM left : AROM   Flexion: 100, abduction 95, ER 30, IR 40 all increase pain and she is very painful with ER and abduction LOWER EXTREMITY ROM:     Active  Right eval Left eval  Hip flexion    Hip extension    Hip abduction    Hip adduction    Hip internal rotation    Hip external rotation    Knee flexion 10 5  Knee extension 95 100  Ankle dorsiflexion    Ankle plantarflexion    Ankle inversion    Ankle eversion     (Blank rows = not tested)  LOWER EXTREMITY MMT:    MMT Right eval Left eval  Hip flexion 4- 4-  Hip extension 4- 4-  Hip abduction 4- 4-  Hip adduction    Hip internal rotation    Hip external rotation    Knee flexion 3+ 4-  Knee extension 3+ 4-  Ankle dorsiflexion 4 4  Ankle plantarflexion    Ankle inversion    Ankle eversion     (Blank rows = not tested)  FUNCTIONAL TESTS:  ODI= 27/50 54% TUG:  33 seconds  GAIT: Distance walked: 80 feet Assistive device utilized: no device Level of assistance: Complete Independence Comments: slow, right foot turned out, limp on the right  TODAY'S TREATMENT:  DATE:   12/01/23 Gait around the back  parking [place], no rest, good pace Rows 15# 2x10 Lats 15# 2x10 Straight arm pulls 5# cues for core and posture 15# HS curls Nustep level 5 x 9 minutes PAssive LE stretches Feet on ball K2C, rotation, small bridge, isometric abs STM with T-gun to HS and ITB  11/24/23 Gait around the parking [place] 1 rest break SBA for curbs Nustep level 5 x 6 minutes Feet on ball K2C, rotation, small bridge, isometric abs Ball b/n knees squeeze Green tband clamshells in hooklying Passive stretch STM with the Tgun to the ITB and the HS  11/19/23 Nustep level 5 x 7 minutes Standing red tband row and extension Feet on ball K2C, small rotation, small bridge and isometric abs Red tband clamshells Ball b/n knees squeeze LE stretches STM to the buttocks and the low back  11/09/23 Nustep L 5 8 min STS from mat with wt ball press 10 x Ball b/n knees squeeze 20x Green tband clamshells 2 sets 10 Green tband hip flex  2 sets 10 Seated red tband row and extension Black tband trunk flex and ext 2 sets 10 LAQ 2 sets 10 Green tband HS curl 2 sets 10  10/28/23 Nustep level 5 x 7 minutes Feet on ball K2C, trunk rotation, small bridge, isometric abs 20# leg curls Ball b/n knees squeeze Green tband clamshells Passive stretch of the LE's Seated red tband row and extension STM to the buttock and back  10/13/23 Nustep L 5 10 min- 2 rest needed for SOB/fatigue Black tband trunk flex and ext 2 sets 10 Seated row 20# 2 sets 10 LAQ , hip flex and hip abd # 2 sets 10 ADD ball squeeze 2 sets 10 Red tband HS curl 2 sets 10 Gentle stretching of LEs and trunk   09/30/23 Evaluation   PATIENT EDUCATION:  Education details: POC Person educated: Patient Education method: Explanation Education comprehension: verbalized understanding  HOME EXERCISE PROGRAM: 12/01/23 K2C, trunk rotation, HS and piriformis stretches  ASSESSMENT:  CLINICAL IMPRESSION:  Pt continues to c/o leg and back pain. She is moving much  better and walking around the parking [place] in the back she did not need any rest and moved much faster and easier.  HS and ITB are very tender.  Gave her some HEP and reviewed.  OBJECTIVE IMPAIRMENTS: Abnormal gait, cardiopulmonary status limiting activity, decreased activity tolerance, decreased balance, decreased coordination, decreased endurance, decreased mobility, difficulty walking, decreased ROM, decreased strength, increased fascial restrictions, impaired perceived functional ability, increased muscle spasms, impaired flexibility, improper body mechanics, and pain.   REHAB POTENTIAL: Good  CLINICAL DECISION MAKING: Stable/uncomplicated  EVALUATION COMPLEXITY: Low   GOALS: Goals reviewed with patient? Yes  SHORT TERM GOALS: Target date: 10/24/23  Independent with initial HEP Goal status: met 12/01/23  LONG TERM GOALS: Target date: 12/28/23  Independent wiht posture and body mechanics Goal status: MET 11/09/23  2.  Decrease TUG time to 15 seconds Goal status: MET 11/09/23  3.  Decrease pain 25% Goal status: ongoing 12/01/23  4.  Be able to cook a meal without pain > 5/10 Goal status: progressing 12/01/23  5.  Independent with HEP or gym Goal status: ongoing 12/01/23  PLAN:  PT FREQUENCY: 1-2x/week  PT DURATION: 12 weeks  PLANNED INTERVENTIONS: Therapeutic exercises, Therapeutic activity, Neuromuscular re-education, Balance training, Gait training, Patient/Family education, Self Care, Joint mobilization, Dry Needling, Electrical stimulation, Cryotherapy, Moist heat, and Manual therapy.  PLAN FOR NEXT SESSION: continue to progress  as tolerated   Jearld Lesch, PT 12/01/2023, 11:07 AM [facility]. [place], [place], [address] Phone: [phone]   Fax:  [phone]   Encounter Date: 12/01/2023

## 2023-12-06 ENCOUNTER — Encounter: Payer: Self-pay | Admitting: Cardiovascular Disease

## 2023-12-08 ENCOUNTER — Ambulatory Visit: Admitting: Physical Therapy

## 2023-12-08 DIAGNOSIS — M5459 Other low back pain: Secondary | ICD-10-CM

## 2023-12-08 DIAGNOSIS — M25561 Pain in right knee: Secondary | ICD-10-CM | POA: Diagnosis not present

## 2023-12-08 DIAGNOSIS — M62830 Muscle spasm of back: Secondary | ICD-10-CM

## 2023-12-08 DIAGNOSIS — R2681 Unsteadiness on feet: Secondary | ICD-10-CM | POA: Diagnosis not present

## 2023-12-08 DIAGNOSIS — R262 Difficulty in walking, not elsewhere classified: Secondary | ICD-10-CM | POA: Diagnosis not present

## 2023-12-08 NOTE — Therapy (Signed)
 OUTPATIENT PHYSICAL THERAPY THORACOLUMBAR    Patient Name: Jenna Mcdonald MRN: [id number] DOB:1946/07/24, 78 y.o., female Today's Date: 12/08/2023  END OF SESSION:  PT End of Session - 12/08/23 1054     Visit Number 8    Number of Visits 13    Date for PT Re-Evaluation 01/13/24    Authorization Type [employer] 6/13    PT Start Time 1054    PT Stop Time 1140    PT Time Calculation (min) 46 min              Past Medical History:  Diagnosis Date   Anemia    Aortic stenosis    mild by echo 09/2020 and 08/2022 with mean AVG   Breast CA (HCC)    left   Breast cancer (HCC)    Carotid stenosis    1-39% left   Degenerative disc disease, lumbar    of the knees   Diabetes mellitus    Dysrhythmia    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HCC)    Encounter for therapeutic drug monitoring 08/17/2018   Fatigue    Severe secondary to to gemfibrozil   Gastroesophageal reflux disease    denies   GI bleeding    a. 06/2018: EGD showed multiple nonbleeding duodenal ulcers. 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   Hammertoe    left second toe   Hypercholesteremia    Hypertension    Insomnia    Irritable bowel    Low back pain    Metabolic syndrome    Morbid obesity (HCC)    OSA on CPAP \   bipap   Osteoarthritis    Osteopenia    Persistent atrial fibrillation [facility])    s/p afib ablation - Dr. Johney Frame 08/2018   Personal history of radiation therapy    Pneumonia    PONV (postoperative nausea and vomiting)    Sigmoid diverticulosis    Thalassemia minor    Unstable angina (HCC) 10/23/2020   Vitamin D deficiency    Past Surgical History:  Procedure Laterality Date   ATRIAL FIBRILLATION ABLATION N/A 09/21/2018   Procedure: ATRIAL FIBRILLATION ABLATION;  Surgeon: Hillis Range, MD;  Location: [facility] INVASIVE CV LAB;  Service: Cardiovascular;  Laterality: N/A;   BIOPSY  07/06/2018   Procedure: BIOPSY;  Surgeon: Vida Rigger, MD;  Location: [facility] ENDOSCOPY;  Service: Endoscopy;;   BREAST LUMPECTOMY Left 05/28/2009   BREAST SURGERY Left 2010   lumpectomy   CARDIOVERSION N/A 07/07/2018   Procedure: CARDIOVERSION;  Surgeon: Chilton Si, MD;  Location: [facility] ENDOSCOPY;  Service: Cardiovascular;  Laterality: N/A;   CARDIOVERSION N/A 07/14/2018   Procedure: CARDIOVERSION;  Surgeon: Laurey Morale, MD;  Location: [facility] ENDOSCOPY;  Service: Cardiovascular;  Laterality: N/A;   CESAREAN SECTION     COLONOSCOPY WITH PROPOFOL N/A 07/06/2018   Procedure: COLONOSCOPY WITH PROPOFOL;  Surgeon: Vida Rigger, MD;  Location: [facility] ENDOSCOPY;  Service: Endoscopy;  Laterality: N/A;   CYSTECTOMY  1970   pilonidal    DILATION AND CURETTAGE OF UTERUS     ESOPHAGOGASTRODUODENOSCOPY (EGD) WITH PROPOFOL N/A 07/06/2018   Procedure: ESOPHAGOGASTRODUODENOSCOPY (EGD) WITH PROPOFOL;  Surgeon: Vida Rigger, MD;  Location: [facility] ENDOSCOPY;  Service: Endoscopy;  Laterality: N/A;   EYE SURGERY Bilateral 07/2013   cataracts   HOT HEMOSTASIS N/A 07/06/2018   Procedure: HOT HEMOSTASIS (ARGON PLASMA COAGULATION/BICAP);  Surgeon: Vida Rigger, MD;  Location: [facility] ENDOSCOPY;  Service: Endoscopy;  Laterality: N/A;   implantable loop recorder  placement  01/15/2022   Medtronic Reveal Linq model [id number] implantable loop recorder (SN [id number]) implanted for AF management by Dr Nunzio Belch   MAXIMUM ACCESS (MAS)POSTERIOR LUMBAR INTERBODY FUSION (PLIF) 2 LEVEL N/A 09/22/2013   Procedure: FOR MAXIMUM ACCESS  POSTERIOR LUMBAR INTERBODY FUSION LUMBAR THREE-FOUR FOUR-FIVE;  Surgeon: Isadora Mar, MD;  Location: [facility] NEURO ORS;  Service: Neurosurgery;  Laterality: N/A;   OOPHORECTOMY     wedge section not rem   REPLACEMENT TOTAL KNEE Left 2011   REPLACEMENT TOTAL KNEE     RIGHT/LEFT HEART CATH AND CORONARY ANGIOGRAPHY N/A 10/26/2020   Procedure: RIGHT/LEFT HEART CATH AND CORONARY ANGIOGRAPHY;  Surgeon: Arleen Lacer, MD;  Location: [facility] INVASIVE CV LAB;  Service:  Cardiovascular;  Laterality: N/A;   TEE WITHOUT CARDIOVERSION N/A 07/07/2018   Procedure: TRANSESOPHAGEAL ECHOCARDIOGRAM (TEE);  Surgeon: Maudine Sos, MD;  Location: [facility] ENDOSCOPY;  Service: Cardiovascular;  Laterality: N/A;   TEE WITHOUT CARDIOVERSION N/A 09/21/2018   Procedure: TRANSESOPHAGEAL ECHOCARDIOGRAM (TEE);  Surgeon: Alroy Aspen Lela Purple, MD;  Location: [facility] ENDOSCOPY;  Service: Cardiovascular;  Laterality: N/A;   TONSILLECTOMY     TUBAL LIGATION     Patient Active Problem List   Diagnosis Date Noted   Hyperlipidemia 09/25/2023   Shortness of breath    Unstable angina (HCC) 10/23/2020   Hypercoagulable state due to paroxysmal atrial fibrillation (HCC) 10/15/2020   Encounter for therapeutic drug monitoring 08/17/2018   CKD (chronic kidney disease) stage 3, GFR 30-59 ml/min (HCC) 07/21/2018   Chronic anemia 07/21/2018   Anemia    Heme positive stool    Atrial fibrillation with RVR (HCC) 07/04/2018   Paroxysmal atrial fibrillation (HCC) 07/02/2018   Carotid stenosis    Bruit of left carotid artery 09/22/2017   Aortic stenosis    Heart murmur 09/09/2016   Morbid obesity (HCC) 02/05/2016   Dry eye 05/17/2015   Type 2 diabetes mellitus without complication (HCC) 08/10/2014   Breast cancer, left breast (HCC) 07/22/2014   Obesity, Class III, BMI 40-49.9 (morbid obesity) (HCC) 07/06/2014   Macular pseudohole 05/17/2014   History of surgical procedure 05/17/2014   OSA (obstructive sleep apnea) 11/17/2013   S/P lumbar spinal fusion 09/22/2013   Persistent atrial fibrillation (HCC) 09/11/2011    Class: Acute   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HCC) 09/11/2011    Class: Chronic   Essential hypertension 09/11/2011   Exogenous obesity 09/11/2011   Gastroesophageal reflux disease 09/11/2011   Thalassemia minor 09/11/2011    Class: Chronic   Breast cancer (HCC) 09/11/2011    Class: Chronic   Degenerative joint disease 09/11/2011    Class: Chronic   Irritable bowel disease 09/11/2011     Class: Chronic   Lumbar disc disease 09/11/2011    PCP: Candi Chafe, MD  REFERRING PROVIDER: Jann Melody, DO  REFERRING DIAG: chronic bag and knee pain  Rationale for Evaluation and Treatment: Rehabilitation  THERAPY DIAG:  Other low back pain  Muscle spasm of back  Unsteadiness on feet  ONSET DATE: 10/14/22  SUBJECTIVE:  SUBJECTIVE STATEMENT:  Achey ,taking tylenol 2 x a day and that helps  Patient reports that she feels that she is unsteady on her feet and walking.  She did have a fall this AM.  She reports that her feet got caught in the bed spread and she fell, she reports that had pain in the knees and the back before but now having an increase of LBP.  She has a history of low back surgery fusion L3-5  PERTINENT HISTORY:  See above  PAIN:  Are you having pain? 6/10 in the low back, worse with standing , walking and activity, sit and rest pain can be a 5/10 but it is always there, ache, feels weak int eh core, reports knees are sore today after she had a fall  PRECAUTIONS: Other: lumbar fusion 2015  WEIGHT BEARING RESTRICTIONS: No  FALLS:  Has patient fallen in last 6 months? Yes, one fall this AM  LIVING ENVIRONMENT: Lives with: lives with their family Lives in: House/apartment Stairs: Yes: Internal: 15 steps; can reach both Has following equipment at home: has a cane and uses at times  OCCUPATION: retired  PLOF: Independent and gardens, does Presenter, broadcasting, cooks and Lexicographer, shops  PATIENT GOALS: strengthen core and have less pain  NEXT MD VISIT:   OBJECTIVE:   DIAGNOSTIC FINDINGS:  No recent x-rays   PATIENT SURVEYS:  ODI = 54% disability  COGNITION: Overall cognitive status: Within functional limits for tasks assessed     SENSATION: WFL  MUSCLE LENGTH: Very tight HS,  piriformis and quads  POSTURE: rounded shoulders and forward head  PALPATION: Tender and tight in the low back mms, tightness iin the rhomboids and the upper traps, very sore in the right low back and into the buttock   LUMBAR ROM:  all motions increased right low back pain  AROM eval  Flexion Decreased 50%  Extension Decreased 100%  Right lateral flexion Decreased 75%  Left lateral flexion Decreased 75%  Right rotation   Left rotation    (Blank rows = not tested)   Shoulder ROM left : AROM   Flexion: 100, abduction 95, ER 30, IR 40 all increase pain and she is very painful with ER and abduction LOWER EXTREMITY ROM:     Active  Right eval Left eval  Hip flexion    Hip extension    Hip abduction    Hip adduction    Hip internal rotation    Hip external rotation    Knee flexion 10 5  Knee extension 95 100  Ankle dorsiflexion    Ankle plantarflexion    Ankle inversion    Ankle eversion     (Blank rows = not tested)  LOWER EXTREMITY MMT:    MMT Right eval Left eval  Hip flexion 4- 4-  Hip extension 4- 4-  Hip abduction 4- 4-  Hip adduction    Hip internal rotation    Hip external rotation    Knee flexion 3+ 4-  Knee extension 3+ 4-  Ankle dorsiflexion 4 4  Ankle plantarflexion    Ankle inversion    Ankle eversion     (Blank rows = not tested)  FUNCTIONAL TESTS:  ODI= 27/50 54% TUG:  33 seconds  GAIT: Distance walked: 80 feet Assistive device utilized: no device Level of assistance: Complete Independence Comments: slow, right foot turned out, limp on the right  TODAY'S TREATMENT:  DATE:    12/08/23 Nustep L 5 10 min Gait around the back parking [place], no rest, good pace 5 min but needed 2 standing rest breaks d/t pan in gluts/HS Feet on ball K2C, rotation, small bridge, isometric abs PROM BIL LE and trunk Black tband 20 x  trunk flex and ext Rows 15# 2x10 Lats 15# 2x10 Black bar heel raise and toe raises 20x STS with wt ball press 10 x       12/01/23 Gait around the back parking [place], no rest, good pace Rows 15# 2x10 Lats 15# 2x10 Straight arm pulls 5# cues for core and posture 15# HS curls Nustep level 5 x 9 minutes PAssive LE stretches Feet on ball K2C, rotation, small bridge, isometric abs STM with T-gun to HS and ITB  11/24/23 Gait around the parking [place] 1 rest break SBA for curbs Nustep level 5 x 6 minutes Feet on ball K2C, rotation, small bridge, isometric abs Ball b/n knees squeeze Green tband clamshells in hooklying Passive stretch STM with the Tgun to the ITB and the HS  11/19/23 Nustep level 5 x 7 minutes Standing red tband row and extension Feet on ball K2C, small rotation, small bridge and isometric abs Red tband clamshells Ball b/n knees squeeze LE stretches STM to the buttocks and the low back  11/09/23 Nustep L 5 8 min STS from mat with wt ball press 10 x Ball b/n knees squeeze 20x Green tband clamshells 2 sets 10 Green tband hip flex  2 sets 10 Seated red tband row and extension Black tband trunk flex and ext 2 sets 10 LAQ 2 sets 10 Green tband HS curl 2 sets 10  10/28/23 Nustep level 5 x 7 minutes Feet on ball K2C, trunk rotation, small bridge, isometric abs 20# leg curls Ball b/n knees squeeze Green tband clamshells Passive stretch of the LE's Seated red tband row and extension STM to the buttock and back  10/13/23 Nustep L 5 10 min- 2 rest needed for SOB/fatigue Black tband trunk flex and ext 2 sets 10 Seated row 20# 2 sets 10 LAQ , hip flex and hip abd # 2 sets 10 ADD ball squeeze 2 sets 10 Red tband HS curl 2 sets 10 Gentle stretching of LEs and trunk   09/30/23 Evaluation   PATIENT EDUCATION:  Education details: POC Person educated: Patient Education method: Explanation Education comprehension: verbalized understanding  HOME EXERCISE  PROGRAM: 12/01/23 K2C, trunk rotation, HS and piriformis stretches  ASSESSMENT:  CLINICAL IMPRESSION: Goals assessed and documented. Walking after doing Nustep she needed 2 standing rest breaks and had increased glut and HS pain. Progressed core strengthening with cuing  OBJECTIVE IMPAIRMENTS: Abnormal gait, cardiopulmonary status limiting activity, decreased activity tolerance, decreased balance, decreased coordination, decreased endurance, decreased mobility, difficulty walking, decreased ROM, decreased strength, increased fascial restrictions, impaired perceived functional ability, increased muscle spasms, impaired flexibility, improper body mechanics, and pain.   REHAB POTENTIAL: Good  CLINICAL DECISION MAKING: Stable/uncomplicated  EVALUATION COMPLEXITY: Low   GOALS: Goals reviewed with patient? Yes  SHORT TERM GOALS: Target date: 10/24/23  Independent with initial HEP Goal status: met 12/01/23  LONG TERM GOALS: Target date: 12/28/23  Independent wiht posture and body mechanics Goal status: MET 11/09/23  2.  Decrease TUG time to 15 seconds Goal status: MET 11/09/23  3.  Decrease pain 25% Goal status: ongoing 12/01/23  progressing 12/08/23  4.  Be able to cook a meal without pain > 5/10 Goal status: progressing 12/01/23 and 12/08/23  5.  Independent with HEP or gym Goal status: ongoing 12/01/23  PLAN:  PT FREQUENCY: 1-2x/week  PT DURATION: 12 weeks  PLANNED INTERVENTIONS: Therapeutic exercises, Therapeutic activity, Neuromuscular re-education, Balance training, Gait training, Patient/Family education, Self Care, Joint mobilization, Dry Needling, Electrical stimulation, Cryotherapy, Moist heat, and Manual therapy.  PLAN FOR NEXT SESSION: continue to progress as tolerated   Aydden Cumpian,ANGIE, PTA 12/08/2023, 10:56 AM [facility] at [facility] [place], [place], [address] Phone: [phone]   Fax:  [phone]   Encounter  Date: 12/08/2023  [facility] at [facility] [address] [place], [place], [address] Phone: [phone]   Fax:  [phone]  Patient Details  Name: Jenna Mcdonald MRN: [id number] Date of Birth: Sep 04, 1945 Referring Provider:  Tena Feeling, MD  Encounter Date: 12/08/2023   Aquilla Bayley, PTA 12/08/2023, 10:56 AM  [place] [facility] at [facility] [address] [facility] [place], [place], [address] Phone: [phone]   Fax:  [phone]

## 2023-12-15 ENCOUNTER — Ambulatory Visit: Admitting: Physical Therapy

## 2023-12-15 DIAGNOSIS — R5383 Other fatigue: Secondary | ICD-10-CM | POA: Diagnosis not present

## 2023-12-15 DIAGNOSIS — J40 Bronchitis, not specified as acute or chronic: Secondary | ICD-10-CM | POA: Diagnosis not present

## 2023-12-15 DIAGNOSIS — R0789 Other chest pain: Secondary | ICD-10-CM | POA: Diagnosis not present

## 2023-12-15 DIAGNOSIS — J329 Chronic sinusitis, unspecified: Secondary | ICD-10-CM | POA: Diagnosis not present

## 2023-12-18 ENCOUNTER — Ambulatory Visit: Admitting: Physical Therapy

## 2023-12-22 ENCOUNTER — Encounter: Payer: Self-pay | Admitting: Physical Therapy

## 2023-12-22 ENCOUNTER — Ambulatory Visit: Admitting: Physical Therapy

## 2023-12-22 DIAGNOSIS — M62830 Muscle spasm of back: Secondary | ICD-10-CM | POA: Diagnosis not present

## 2023-12-22 DIAGNOSIS — M5459 Other low back pain: Secondary | ICD-10-CM

## 2023-12-22 DIAGNOSIS — R262 Difficulty in walking, not elsewhere classified: Secondary | ICD-10-CM

## 2023-12-22 DIAGNOSIS — M25561 Pain in right knee: Secondary | ICD-10-CM | POA: Diagnosis not present

## 2023-12-22 DIAGNOSIS — R2681 Unsteadiness on feet: Secondary | ICD-10-CM | POA: Diagnosis not present

## 2023-12-22 NOTE — Therapy (Signed)
 OUTPATIENT PHYSICAL THERAPY THORACOLUMBAR    Patient Name: Jenna Mcdonald MRN: [id number] DOB:12-26-1945, 78 y.o., female Today's Date: 12/22/2023  END OF SESSION:  PT End of Session - 12/22/23 1054     Visit Number 9    Number of Visits 13    Date for PT Re-Evaluation 01/13/24    Authorization Type [employer] 8/12    PT Start Time 1052    PT Stop Time 1140    PT Time Calculation (min) 48 min    Activity Tolerance Patient tolerated treatment well    Behavior During Therapy [facility] for tasks assessed/performed              Past Medical History:  Diagnosis Date   Anemia    Aortic stenosis    mild by echo 09/2020 and 08/2022 with mean AVG   Breast CA (HCC)    left   Breast cancer (HCC)    Carotid stenosis    1-39% left   Degenerative disc disease, lumbar    of the knees   Diabetes mellitus    Dysrhythmia    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HCC)    Encounter for therapeutic drug monitoring 08/17/2018   Fatigue    Severe secondary to to gemfibrozil   Gastroesophageal reflux disease    denies   GI bleeding    a. 06/2018: EGD showed multiple nonbleeding duodenal ulcers. 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   Hammertoe    left second toe   Hypercholesteremia    Hypertension    Insomnia    Irritable bowel    Low back pain    Metabolic syndrome    Morbid obesity (HCC)    OSA on CPAP \   bipap   Osteoarthritis    Osteopenia    Persistent atrial fibrillation [facility])    s/p afib ablation - Dr. Nunzio Belch 08/2018   Personal history of radiation therapy    Pneumonia    PONV (postoperative nausea and vomiting)    Sigmoid diverticulosis    Thalassemia minor    Unstable angina (HCC) 10/23/2020   Vitamin D  deficiency    Past Surgical History:  Procedure Laterality Date   ATRIAL FIBRILLATION ABLATION N/A 09/21/2018   Procedure: ATRIAL FIBRILLATION ABLATION;  Surgeon: Jolly Needle, MD;  Location: [facility]  INVASIVE CV LAB;  Service: Cardiovascular;  Laterality: N/A;   BIOPSY  07/06/2018   Procedure: BIOPSY;  Surgeon: Ozell Blunt, MD;  Location: [facility] ENDOSCOPY;  Service: Endoscopy;;   BREAST LUMPECTOMY Left 05/28/2009   BREAST SURGERY Left 2010   lumpectomy   CARDIOVERSION N/A 07/07/2018   Procedure: CARDIOVERSION;  Surgeon: Maudine Sos, MD;  Location: [facility] ENDOSCOPY;  Service: Cardiovascular;  Laterality: N/A;   CARDIOVERSION N/A 07/14/2018   Procedure: CARDIOVERSION;  Surgeon: Darlis Eisenmenger, MD;  Location: [facility] ENDOSCOPY;  Service: Cardiovascular;  Laterality: N/A;   CESAREAN SECTION     COLONOSCOPY WITH PROPOFOL  N/A 07/06/2018   Procedure: COLONOSCOPY WITH PROPOFOL ;  Surgeon: Ozell Blunt, MD;  Location: [facility] ENDOSCOPY;  Service: Endoscopy;  Laterality: N/A;   CYSTECTOMY  1970   pilonidal    DILATION AND CURETTAGE OF UTERUS     ESOPHAGOGASTRODUODENOSCOPY (EGD) WITH PROPOFOL  N/A 07/06/2018   Procedure: ESOPHAGOGASTRODUODENOSCOPY (EGD) WITH PROPOFOL ;  Surgeon: Ozell Blunt, MD;  Location: [facility] ENDOSCOPY;  Service: Endoscopy;  Laterality: N/A;   EYE SURGERY Bilateral 07/2013   cataracts   HOT HEMOSTASIS N/A 07/06/2018   Procedure: HOT HEMOSTASIS (ARGON PLASMA COAGULATION/BICAP);  Surgeon: Ozell Blunt, MD;  Location: [facility] ENDOSCOPY;  Service: Endoscopy;  Laterality: N/A;   implantable loop recorder placement  01/15/2022   Medtronic Reveal Linq model [id number] implantable loop recorder (SN [id number]) implanted for AF management by Dr Nunzio Belch   MAXIMUM ACCESS (MAS)POSTERIOR LUMBAR INTERBODY FUSION (PLIF) 2 LEVEL N/A 09/22/2013   Procedure: FOR MAXIMUM ACCESS  POSTERIOR LUMBAR INTERBODY FUSION LUMBAR THREE-FOUR FOUR-FIVE;  Surgeon: Isadora Mar, MD;  Location: [facility] NEURO ORS;  Service: Neurosurgery;  Laterality: N/A;   OOPHORECTOMY     wedge section not rem   REPLACEMENT TOTAL KNEE Left 2011   REPLACEMENT TOTAL KNEE     RIGHT/LEFT HEART CATH AND CORONARY ANGIOGRAPHY N/A 10/26/2020   Procedure:  RIGHT/LEFT HEART CATH AND CORONARY ANGIOGRAPHY;  Surgeon: Arleen Lacer, MD;  Location: [facility] INVASIVE CV LAB;  Service: Cardiovascular;  Laterality: N/A;   TEE WITHOUT CARDIOVERSION N/A 07/07/2018   Procedure: TRANSESOPHAGEAL ECHOCARDIOGRAM (TEE);  Surgeon: Maudine Sos, MD;  Location: [facility] ENDOSCOPY;  Service: Cardiovascular;  Laterality: N/A;   TEE WITHOUT CARDIOVERSION N/A 09/21/2018   Procedure: TRANSESOPHAGEAL ECHOCARDIOGRAM (TEE);  Surgeon: Alroy Aspen Lela Purple, MD;  Location: [facility] ENDOSCOPY;  Service: Cardiovascular;  Laterality: N/A;   TONSILLECTOMY     TUBAL LIGATION     Patient Active Problem List   Diagnosis Date Noted   Hyperlipidemia 09/25/2023   Shortness of breath    Unstable angina (HCC) 10/23/2020   Hypercoagulable state due to paroxysmal atrial fibrillation (HCC) 10/15/2020   Encounter for therapeutic drug monitoring 08/17/2018   CKD (chronic kidney disease) stage 3, GFR 30-59 ml/min (HCC) 07/21/2018   Chronic anemia 07/21/2018   Anemia    Heme positive stool    Atrial fibrillation with RVR (HCC) 07/04/2018   Paroxysmal atrial fibrillation (HCC) 07/02/2018   Carotid stenosis    Bruit of left carotid artery 09/22/2017   Aortic stenosis    Heart murmur 09/09/2016   Morbid obesity (HCC) 02/05/2016   Dry eye 05/17/2015   Type 2 diabetes mellitus without complication (HCC) 08/10/2014   Breast cancer, left breast (HCC) 07/22/2014   Obesity, Class III, BMI 40-49.9 (morbid obesity) (HCC) 07/06/2014   Macular pseudohole 05/17/2014   History of surgical procedure 05/17/2014   OSA (obstructive sleep apnea) 11/17/2013   S/P lumbar spinal fusion 09/22/2013   Persistent atrial fibrillation (HCC) 09/11/2011    Class: Acute   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HCC) 09/11/2011    Class: Chronic   Essential hypertension 09/11/2011   Exogenous obesity 09/11/2011   Gastroesophageal reflux disease 09/11/2011   Thalassemia minor 09/11/2011    Class: Chronic   Breast cancer (HCC) 09/11/2011     Class: Chronic   Degenerative joint disease 09/11/2011    Class: Chronic   Irritable bowel disease 09/11/2011    Class: Chronic   Lumbar disc disease 09/11/2011    PCP: Candi Chafe, MD  REFERRING PROVIDER: Jann Melody, DO  REFERRING DIAG: chronic bag and knee pain  Rationale for Evaluation and Treatment: Rehabilitation  THERAPY DIAG:  Other low back pain  Muscle spasm of back  Unsteadiness on feet  Difficulty in walking, not elsewhere classified  ONSET DATE: 10/14/22  SUBJECTIVE:  SUBJECTIVE STATEMENT:  A little stiff and sore, have some sinus issues. Still hurting  Patient reports that she feels that she is unsteady on her feet and walking.  She did have a fall this AM.  She reports that her feet got caught in the bed spread and she fell, she reports that had pain in the knees and the back before but now having an increase of LBP.  She has a history of low back surgery fusion L3-5  PERTINENT HISTORY:  See above  PAIN:  Are you having pain? 6/10 in the low back, worse with standing , walking and activity, sit and rest pain can be a 5/10 but it is always there, ache, feels weak int eh core, reports knees are sore today after she had a fall  PRECAUTIONS: Other: lumbar fusion 2015  WEIGHT BEARING RESTRICTIONS: No  FALLS:  Has patient fallen in last 6 months? Yes, one fall this AM  LIVING ENVIRONMENT: Lives with: lives with their family Lives in: House/apartment Stairs: Yes: Internal: 15 steps; can reach both Has following equipment at home: has a cane and uses at times  OCCUPATION: retired  PLOF: Independent and gardens, does Presenter, broadcasting, cooks and Lexicographer, shops  PATIENT GOALS: strengthen core and have less pain  NEXT MD VISIT:   OBJECTIVE:   DIAGNOSTIC FINDINGS:  No recent x-rays    PATIENT SURVEYS:  ODI = 54% disability  COGNITION: Overall cognitive status: Within functional limits for tasks assessed     SENSATION: WFL  MUSCLE LENGTH: Very tight HS, piriformis and quads  POSTURE: rounded shoulders and forward head  PALPATION: Tender and tight in the low back mms, tightness iin the rhomboids and the upper traps, very sore in the right low back and into the buttock   LUMBAR ROM:  all motions increased right low back pain  AROM eval  Flexion Decreased 50%  Extension Decreased 100%  Right lateral flexion Decreased 75%  Left lateral flexion Decreased 75%  Right rotation   Left rotation    (Blank rows = not tested)   Shoulder ROM left : AROM   Flexion: 100, abduction 95, ER 30, IR 40 all increase pain and she is very painful with ER and abduction LOWER EXTREMITY ROM:     Active  Right eval Left eval  Hip flexion    Hip extension    Hip abduction    Hip adduction    Hip internal rotation    Hip external rotation    Knee flexion 10 5  Knee extension 95 100  Ankle dorsiflexion    Ankle plantarflexion    Ankle inversion    Ankle eversion     (Blank rows = not tested)  LOWER EXTREMITY MMT:    MMT Right eval Left eval  Hip flexion 4- 4-  Hip extension 4- 4-  Hip abduction 4- 4-  Hip adduction    Hip internal rotation    Hip external rotation    Knee flexion 3+ 4-  Knee extension 3+ 4-  Ankle dorsiflexion 4 4  Ankle plantarflexion    Ankle inversion    Ankle eversion     (Blank rows = not tested)  FUNCTIONAL TESTS:  ODI= 27/50 54% TUG:  33 seconds  GAIT: Distance walked: 80 feet Assistive device utilized: no device Level of assistance: Complete Independence Comments: slow, right foot turned out, limp on the right  TODAY'S TREATMENT:  DATE:   12/22/23 Nustep level 5 x 10 minutes 20# rows 20# lats 20#  HS curls Feet on ball K2C, rotation, small bridges, isometric abs Green tband clamshells Ball b/n knees squeeze Passive stretch LE's  12/08/23 Nustep L 5 10 min Gait around the back parking [place], no rest, good pace 5 min but needed 2 standing rest breaks d/t pan in gluts/HS Feet on ball K2C, rotation, small bridge, isometric abs PROM BIL LE and trunk Black tband 20 x trunk flex and ext Rows 15# 2x10 Lats 15# 2x10 Black bar heel raise and toe raises 20x STS with wt ball press 10 x   12/01/23 Gait around the back parking [place], no rest, good pace Rows 15# 2x10 Lats 15# 2x10 Straight arm pulls 5# cues for core and posture 15# HS curls Nustep level 5 x 9 minutes PAssive LE stretches Feet on ball K2C, rotation, small bridge, isometric abs STM with T-gun to HS and ITB  11/24/23 Gait around the parking [place] 1 rest break SBA for curbs Nustep level 5 x 6 minutes Feet on ball K2C, rotation, small bridge, isometric abs Ball b/n knees squeeze Green tband clamshells in hooklying Passive stretch STM with the Tgun to the ITB and the HS  11/19/23 Nustep level 5 x 7 minutes Standing red tband row and extension Feet on ball K2C, small rotation, small bridge and isometric abs Red tband clamshells Ball b/n knees squeeze LE stretches STM to the buttocks and the low back  11/09/23 Nustep L 5 8 min STS from mat with wt ball press 10 x Ball b/n knees squeeze 20x Green tband clamshells 2 sets 10 Green tband hip flex  2 sets 10 Seated red tband row and extension Black tband trunk flex and ext 2 sets 10 LAQ 2 sets 10 Green tband HS curl 2 sets 10  10/28/23 Nustep level 5 x 7 minutes Feet on ball K2C, trunk rotation, small bridge, isometric abs 20# leg curls Ball b/n knees squeeze Green tband clamshells Passive stretch of the LE's Seated red tband row and extension STM to the buttock and back  10/13/23 Nustep L 5 10 min- 2 rest needed for SOB/fatigue Black tband trunk flex and  ext 2 sets 10 Seated row 20# 2 sets 10 LAQ , hip flex and hip abd # 2 sets 10 ADD ball squeeze 2 sets 10 Red tband HS curl 2 sets 10 Gentle stretching of LEs and trunk   09/30/23 Evaluation   PATIENT EDUCATION:  Education details: POC Person educated: Patient Education method: Explanation Education comprehension: verbalized understanding  HOME EXERCISE PROGRAM: 12/01/23 K2C, trunk rotation, HS and piriformis stretches  ASSESSMENT:  CLINICAL IMPRESSION: Patient frustrated that she is still having pain, and reports walking really causes back tightness and soreness.  She does report that she continues to be able to work in her yard and garden.  Exercises she requires cues for posture and form and reminders to engage the core  OBJECTIVE IMPAIRMENTS: Abnormal gait, cardiopulmonary status limiting activity, decreased activity tolerance, decreased balance, decreased coordination, decreased endurance, decreased mobility, difficulty walking, decreased ROM, decreased strength, increased fascial restrictions, impaired perceived functional ability, increased muscle spasms, impaired flexibility, improper body mechanics, and pain.   REHAB POTENTIAL: Good  CLINICAL DECISION MAKING: Stable/uncomplicated  EVALUATION COMPLEXITY: Low   GOALS: Goals reviewed with patient? Yes  SHORT TERM GOALS: Target date: 10/24/23  Independent with initial HEP Goal status: met 12/01/23  LONG TERM GOALS: Target date: 12/28/23  Independent wiht posture  and body mechanics Goal status: MET 11/09/23  2.  Decrease TUG time to 15 seconds Goal status: MET 11/09/23  3.  Decrease pain 25% Goal status: ongoing 12/01/23  progressing 12/08/23  4.  Be able to cook a meal without pain > 5/10 Goal status: progressing 12/01/23 and 12/08/23  5.  Independent with HEP or gym Goal status: progressing 12/22/23  PLAN:  PT FREQUENCY: 1-2x/week  PT DURATION: 12 weeks  PLANNED INTERVENTIONS: Therapeutic exercises, Therapeutic  activity, Neuromuscular re-education, Balance training, Gait training, Patient/Family education, Self Care, Joint mobilization, Dry Needling, Electrical stimulation, Cryotherapy, Moist heat, and Manual therapy.  PLAN FOR NEXT SESSION: continue to progress as tolerated   Hollis Lurie, PT 12/22/2023, 10:55 AM [facility] at [facility] [place], [place], [address] Phone: [phone]   Fax:  [phone]

## 2023-12-23 ENCOUNTER — Other Ambulatory Visit (HOSPITAL_BASED_OUTPATIENT_CLINIC_OR_DEPARTMENT_OTHER): Payer: Self-pay

## 2023-12-31 ENCOUNTER — Ambulatory Visit: Attending: Internal Medicine | Admitting: Physical Therapy

## 2023-12-31 DIAGNOSIS — M5459 Other low back pain: Secondary | ICD-10-CM

## 2023-12-31 DIAGNOSIS — M62830 Muscle spasm of back: Secondary | ICD-10-CM | POA: Diagnosis not present

## 2023-12-31 DIAGNOSIS — R262 Difficulty in walking, not elsewhere classified: Secondary | ICD-10-CM

## 2023-12-31 DIAGNOSIS — M25561 Pain in right knee: Secondary | ICD-10-CM | POA: Insufficient documentation

## 2023-12-31 DIAGNOSIS — R2681 Unsteadiness on feet: Secondary | ICD-10-CM | POA: Diagnosis not present

## 2023-12-31 NOTE — Therapy (Signed)
 OUTPATIENT PHYSICAL THERAPY THORACOLUMBAR  Progress Note Reporting Period 09/30/23 to 12/31/23  See note below for Objective Data and Assessment of Progress/Goals.      Patient Name: Jenna Mcdonald MRN: [id number] DOB:Nov 26, 1945, 78 y.o., female Today's Date: 12/31/2023  END OF SESSION:  PT End of Session - 12/31/23 0930     Visit Number 10    Number of Visits 13    Date for PT Re-Evaluation 01/13/24    Authorization Type [employer] 8/12    PT Start Time 0930    PT Stop Time 1015    PT Time Calculation (min) 45 min              Past Medical History:  Diagnosis Date   Anemia    Aortic stenosis    mild by echo 09/2020 and 08/2022 with mean AVG   Breast CA (HCC)    left   Breast cancer (HCC)    Carotid stenosis    1-39% left   Degenerative disc disease, lumbar    of the knees   Diabetes mellitus    Dysrhythmia    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HCC)    Encounter for therapeutic drug monitoring 08/17/2018   Fatigue    Severe secondary to to gemfibrozil   Gastroesophageal reflux disease    denies   GI bleeding    a. 06/2018: EGD showed multiple nonbleeding duodenal ulcers. 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   Hammertoe    left second toe   Hypercholesteremia    Hypertension    Insomnia    Irritable bowel    Low back pain    Metabolic syndrome    Morbid obesity (HCC)    OSA on CPAP \   bipap   Osteoarthritis    Osteopenia    Persistent atrial fibrillation [facility])    s/p afib ablation - Dr. Nunzio Belch 08/2018   Personal history of radiation therapy    Pneumonia    PONV (postoperative nausea and vomiting)    Sigmoid diverticulosis    Thalassemia minor    Unstable angina (HCC) 10/23/2020   Vitamin D  deficiency    Past Surgical History:  Procedure Laterality Date   ATRIAL FIBRILLATION ABLATION N/A 09/21/2018   Procedure: ATRIAL FIBRILLATION ABLATION;  Surgeon: Jolly Needle, MD;  Location: [facility]  INVASIVE CV LAB;  Service: Cardiovascular;  Laterality: N/A;   BIOPSY  07/06/2018   Procedure: BIOPSY;  Surgeon: Ozell Blunt, MD;  Location: [facility] ENDOSCOPY;  Service: Endoscopy;;   BREAST LUMPECTOMY Left 05/28/2009   BREAST SURGERY Left 2010   lumpectomy   CARDIOVERSION N/A 07/07/2018   Procedure: CARDIOVERSION;  Surgeon: Maudine Sos, MD;  Location: [facility] ENDOSCOPY;  Service: Cardiovascular;  Laterality: N/A;   CARDIOVERSION N/A 07/14/2018   Procedure: CARDIOVERSION;  Surgeon: Darlis Eisenmenger, MD;  Location: [facility] ENDOSCOPY;  Service: Cardiovascular;  Laterality: N/A;   CESAREAN SECTION     COLONOSCOPY WITH PROPOFOL  N/A 07/06/2018   Procedure: COLONOSCOPY WITH PROPOFOL ;  Surgeon: Ozell Blunt, MD;  Location: [facility] ENDOSCOPY;  Service: Endoscopy;  Laterality: N/A;   CYSTECTOMY  1970   pilonidal    DILATION AND CURETTAGE OF UTERUS     ESOPHAGOGASTRODUODENOSCOPY (EGD) WITH PROPOFOL  N/A 07/06/2018   Procedure: ESOPHAGOGASTRODUODENOSCOPY (EGD) WITH PROPOFOL ;  Surgeon: Ozell Blunt, MD;  Location: [facility] ENDOSCOPY;  Service: Endoscopy;  Laterality: N/A;   EYE SURGERY Bilateral 07/2013   cataracts   HOT HEMOSTASIS N/A 07/06/2018   Procedure: HOT HEMOSTASIS (ARGON PLASMA  COAGULATION/BICAP);  Surgeon: Ozell Blunt, MD;  Location: [facility] ENDOSCOPY;  Service: Endoscopy;  Laterality: N/A;   implantable loop recorder placement  01/15/2022   Medtronic Reveal Linq model [id number] implantable loop recorder (SN [id number]) implanted for AF management by Dr Nunzio Belch   MAXIMUM ACCESS (MAS)POSTERIOR LUMBAR INTERBODY FUSION (PLIF) 2 LEVEL N/A 09/22/2013   Procedure: FOR MAXIMUM ACCESS  POSTERIOR LUMBAR INTERBODY FUSION LUMBAR THREE-FOUR FOUR-FIVE;  Surgeon: Isadora Mar, MD;  Location: [facility] NEURO ORS;  Service: Neurosurgery;  Laterality: N/A;   OOPHORECTOMY     wedge section not rem   REPLACEMENT TOTAL KNEE Left 2011   REPLACEMENT TOTAL KNEE     RIGHT/LEFT HEART CATH AND CORONARY ANGIOGRAPHY N/A 10/26/2020   Procedure:  RIGHT/LEFT HEART CATH AND CORONARY ANGIOGRAPHY;  Surgeon: Arleen Lacer, MD;  Location: [facility] INVASIVE CV LAB;  Service: Cardiovascular;  Laterality: N/A;   TEE WITHOUT CARDIOVERSION N/A 07/07/2018   Procedure: TRANSESOPHAGEAL ECHOCARDIOGRAM (TEE);  Surgeon: Maudine Sos, MD;  Location: [facility] ENDOSCOPY;  Service: Cardiovascular;  Laterality: N/A;   TEE WITHOUT CARDIOVERSION N/A 09/21/2018   Procedure: TRANSESOPHAGEAL ECHOCARDIOGRAM (TEE);  Surgeon: Alroy Aspen Lela Purple, MD;  Location: [facility] ENDOSCOPY;  Service: Cardiovascular;  Laterality: N/A;   TONSILLECTOMY     TUBAL LIGATION     Patient Active Problem List   Diagnosis Date Noted   Hyperlipidemia 09/25/2023   Shortness of breath    Unstable angina (HCC) 10/23/2020   Hypercoagulable state due to paroxysmal atrial fibrillation (HCC) 10/15/2020   Encounter for therapeutic drug monitoring 08/17/2018   CKD (chronic kidney disease) stage 3, GFR 30-59 ml/min (HCC) 07/21/2018   Chronic anemia 07/21/2018   Anemia    Heme positive stool    Atrial fibrillation with RVR (HCC) 07/04/2018   Paroxysmal atrial fibrillation (HCC) 07/02/2018   Carotid stenosis    Bruit of left carotid artery 09/22/2017   Aortic stenosis    Heart murmur 09/09/2016   Morbid obesity (HCC) 02/05/2016   Dry eye 05/17/2015   Type 2 diabetes mellitus without complication (HCC) 08/10/2014   Breast cancer, left breast (HCC) 07/22/2014   Obesity, Class III, BMI 40-49.9 (morbid obesity) (HCC) 07/06/2014   Macular pseudohole 05/17/2014   History of surgical procedure 05/17/2014   OSA (obstructive sleep apnea) 11/17/2013   S/P lumbar spinal fusion 09/22/2013   Persistent atrial fibrillation (HCC) 09/11/2011    Class: Acute   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HCC) 09/11/2011    Class: Chronic   Essential hypertension 09/11/2011   Exogenous obesity 09/11/2011   Gastroesophageal reflux disease 09/11/2011   Thalassemia minor 09/11/2011    Class: Chronic   Breast cancer (HCC) 09/11/2011     Class: Chronic   Degenerative joint disease 09/11/2011    Class: Chronic   Irritable bowel disease 09/11/2011    Class: Chronic   Lumbar disc disease 09/11/2011    PCP: Candi Chafe, MD  REFERRING PROVIDER: Jann Melody, DO  REFERRING DIAG: chronic bag and knee pain  Rationale for Evaluation and Treatment: Rehabilitation  THERAPY DIAG:  Other low back pain  Muscle spasm of back  Unsteadiness on feet  Difficulty in walking, not elsewhere classified  ONSET DATE: 10/14/22  SUBJECTIVE:  SUBJECTIVE STATEMENT:  A little stiff and sore, have some sinus issues- seeing MD next week. Still hurting  Patient reports that she feels that she is unsteady on her feet and walking.  She did have a fall this AM.  She reports that her feet got caught in the bed spread and she fell, she reports that had pain in the knees and the back before but now having an increase of LBP.  She has a history of low back surgery fusion L3-5  PERTINENT HISTORY:  See above  PAIN:  Are you having pain? 6/10 in the low back, worse with standing , walking and activity, sit and rest pain can be a 5/10 but it is always there, ache, feels weak int eh core, reports knees are sore today after she had a fall  PRECAUTIONS: Other: lumbar fusion 2015  WEIGHT BEARING RESTRICTIONS: No  FALLS:  Has patient fallen in last 6 months? Yes, one fall this AM  LIVING ENVIRONMENT: Lives with: lives with their family Lives in: House/apartment Stairs: Yes: Internal: 15 steps; can reach both Has following equipment at home: has a cane and uses at times  OCCUPATION: retired  PLOF: Independent and gardens, does Presenter, broadcasting, cooks and Lexicographer, shops  PATIENT GOALS: strengthen core and have less pain  NEXT MD VISIT:   OBJECTIVE:   DIAGNOSTIC FINDINGS:   No recent x-rays   PATIENT SURVEYS:  ODI = 54% disability  COGNITION: Overall cognitive status: Within functional limits for tasks assessed     SENSATION: WFL  MUSCLE LENGTH: Very tight HS, piriformis and quads  POSTURE: rounded shoulders and forward head  PALPATION: Tender and tight in the low back mms, tightness iin the rhomboids and the upper traps, very sore in the right low back and into the buttock   LUMBAR ROM:  all motions increased right low back pain  AROM eval  Flexion Decreased 50%  Extension Decreased 100%  Right lateral flexion Decreased 75%  Left lateral flexion Decreased 75%  Right rotation   Left rotation    (Blank rows = not tested)   Shoulder ROM left : AROM   Flexion: 100, abduction 95, ER 30, IR 40 all increase pain and she is very painful with ER and abduction LOWER EXTREMITY ROM:     Active  Right eval Left eval  Hip flexion    Hip extension    Hip abduction    Hip adduction    Hip internal rotation    Hip external rotation    Knee flexion 10 5  Knee extension 95 100  Ankle dorsiflexion    Ankle plantarflexion    Ankle inversion    Ankle eversion     (Blank rows = not tested)  LOWER EXTREMITY MMT:    MMT Right eval Left eval  Hip flexion 4- 4-  Hip extension 4- 4-  Hip abduction 4- 4-  Hip adduction    Hip internal rotation    Hip external rotation    Knee flexion 3+ 4-  Knee extension 3+ 4-  Ankle dorsiflexion 4 4  Ankle plantarflexion    Ankle inversion    Ankle eversion     (Blank rows = not tested)  FUNCTIONAL TESTS:  ODI= 27/50 54% TUG:  33 seconds  GAIT: Distance walked: 80 feet Assistive device utilized: no device Level of assistance: Complete Independence Comments: slow, right foot turned out, limp on the right  TODAY'S TREATMENT:  DATE:    12/31/23 Nustep L 6 10 min Gait around  the back parking [place], no rest, good pace 3 min with 1  standing rest breaks d/t pain in gluts and general leg pain Modified Dead lifts 5# 8x then 8x without wt 20# rows 2 sets 10 20# lats 2 sets 10 Black tband trunk flex and ext 20x Feet on ball K2C, rotation, small bridges, isometric abs Passive LE stretching    12/22/23 Nustep level 5 x 10 minutes 20# rows 20# lats 20# HS curls Feet on ball K2C, rotation, small bridges, isometric abs Green tband clamshells Ball b/n knees squeeze Passive stretch LE's  12/08/23 Nustep L 5 10 min Gait around the back parking [place], no rest, good pace 5 min but needed 2 standing rest breaks d/t pan in gluts/HS Feet on ball K2C, rotation, small bridge, isometric abs PROM BIL LE and trunk Black tband 20 x trunk flex and ext Rows 15# 2x10 Lats 15# 2x10 Black bar heel raise and toe raises 20x STS with wt ball press 10 x   12/01/23 Gait around the back parking [place], no rest, good pace Rows 15# 2x10 Lats 15# 2x10 Straight arm pulls 5# cues for core and posture 15# HS curls Nustep level 5 x 9 minutes PAssive LE stretches Feet on ball K2C, rotation, small bridge, isometric abs STM with T-gun to HS and ITB  11/24/23 Gait around the parking [place] 1 rest break SBA for curbs Nustep level 5 x 6 minutes Feet on ball K2C, rotation, small bridge, isometric abs Ball b/n knees squeeze Green tband clamshells in hooklying Passive stretch STM with the Tgun to the ITB and the HS  11/19/23 Nustep level 5 x 7 minutes Standing red tband row and extension Feet on ball K2C, small rotation, small bridge and isometric abs Red tband clamshells Ball b/n knees squeeze LE stretches STM to the buttocks and the low back  11/09/23 Nustep L 5 8 min STS from mat with wt ball press 10 x Ball b/n knees squeeze 20x Green tband clamshells 2 sets 10 Green tband hip flex  2 sets 10 Seated red tband row and extension Black tband trunk flex and ext 2 sets 10 LAQ  2 sets 10 Green tband HS curl 2 sets 10  10/28/23 Nustep level 5 x 7 minutes Feet on ball K2C, trunk rotation, small bridge, isometric abs 20# leg curls Ball b/n knees squeeze Green tband clamshells Passive stretch of the LE's Seated red tband row and extension STM to the buttock and back  10/13/23 Nustep L 5 10 min- 2 rest needed for SOB/fatigue Black tband trunk flex and ext 2 sets 10 Seated row 20# 2 sets 10 LAQ , hip flex and hip abd # 2 sets 10 ADD ball squeeze 2 sets 10 Red tband HS curl 2 sets 10 Gentle stretching of LEs and trunk   09/30/23 Evaluation   PATIENT EDUCATION:  Education details: POC Person educated: Patient Education method: Explanation Education comprehension: verbalized understanding  HOME EXERCISE PROGRAM: 12/01/23 K2C, trunk rotation, HS and piriformis stretches  ASSESSMENT:  CLINICAL IMPRESSION: Patient frustrated that she is still having pain, and reports walking really causes back tightness and soreness.  She does report that she continues to be able to work in her yard and garden.  Exercises she requires cues for posture and form and reminders to engage the core. Assessed and documented goals.Final HEP is evolving as we progress towards D/C. Overall pt states pain is 50%  better on most days but comes and goes. Pt states is unsure if knees and the way she walks causes back pain or if back pain is radiating to legs- frustrating.  OBJECTIVE IMPAIRMENTS: Abnormal gait, cardiopulmonary status limiting activity, decreased activity tolerance, decreased balance, decreased coordination, decreased endurance, decreased mobility, difficulty walking, decreased ROM, decreased strength, increased fascial restrictions, impaired perceived functional ability, increased muscle spasms, impaired flexibility, improper body mechanics, and pain.   REHAB POTENTIAL: Good  CLINICAL DECISION MAKING: Stable/uncomplicated  EVALUATION COMPLEXITY: Low   GOALS: Goals reviewed  with patient? Yes  SHORT TERM GOALS: Target date: 10/24/23  Independent with initial HEP Goal status: met 12/01/23  LONG TERM GOALS: Target date: 12/28/23  Independent wiht posture and body mechanics Goal status: MET 11/09/23  2.  Decrease TUG time to 15 seconds Goal status: MET 11/09/23  3.  Decrease pain 25% Goal status: ongoing 12/01/23  progressing 12/08/23  . 12/31/23 good and bad days overall 50% better ( back and knees pain - not sure what causes pain)  4.  Be able to cook a meal without pain > 5/10 Goal status: progressing 12/01/23 and 12/08/23. 12/31/23 MET  5.  Independent with HEP or gym Goal status: progressing 12/22/23  12/31/23 evolving  PLAN:  PT FREQUENCY: 1-2x/week  PT DURATION: 12 weeks  PLANNED INTERVENTIONS: Therapeutic exercises, Therapeutic activity, Neuromuscular re-education, Balance training, Gait training, Patient/Family education, Self Care, Joint mobilization, Dry Needling, Electrical stimulation, Cryotherapy, Moist heat, and Manual therapy.  PLAN FOR NEXT SESSION: continue to progress as tolerated   Abel Hageman,ANGIE, PTA 12/31/2023, 9:31 AM [facility] [address] [facility]. [place], [place], [address] Phone: [phone]   Fax:  [phone]

## 2024-01-04 ENCOUNTER — Ambulatory Visit (INDEPENDENT_AMBULATORY_CARE_PROVIDER_SITE_OTHER): Payer: Medicare PPO

## 2024-01-04 DIAGNOSIS — I480 Paroxysmal atrial fibrillation: Secondary | ICD-10-CM

## 2024-01-05 LAB — CUP PACEART REMOTE DEVICE CHECK
Date Time Interrogation Session: 20250511233955
Implantable Pulse Generator Implant Date: 20230524

## 2024-01-06 ENCOUNTER — Encounter: Payer: Self-pay | Admitting: Physical Therapy

## 2024-01-06 ENCOUNTER — Ambulatory Visit: Admitting: Physical Therapy

## 2024-01-06 DIAGNOSIS — R2681 Unsteadiness on feet: Secondary | ICD-10-CM

## 2024-01-06 DIAGNOSIS — M5459 Other low back pain: Secondary | ICD-10-CM

## 2024-01-06 DIAGNOSIS — M25561 Pain in right knee: Secondary | ICD-10-CM

## 2024-01-06 DIAGNOSIS — R262 Difficulty in walking, not elsewhere classified: Secondary | ICD-10-CM

## 2024-01-06 DIAGNOSIS — M62830 Muscle spasm of back: Secondary | ICD-10-CM | POA: Diagnosis not present

## 2024-01-06 NOTE — Therapy (Signed)
 OUTPATIENT PHYSICAL THERAPY THORACOLUMBAR     Patient Name: Jenna Mcdonald MRN: [id number] DOB:Jul 17, 1946, 78 y.o., female Today's Date: 01/06/2024  END OF SESSION:  PT End of Session - 01/06/24 1311     Visit Number 11    Number of Visits 20    Date for PT Re-Evaluation 03/07/24    Authorization Type [employer] 1/10    PT Start Time 1311    PT Stop Time 1358    PT Time Calculation (min) 47 min    Activity Tolerance Patient tolerated treatment well    Behavior During Therapy [facility] for tasks assessed/performed              Past Medical History:  Diagnosis Date   Anemia    Aortic stenosis    mild by echo 09/2020 and 08/2022 with mean AVG   Breast CA (HCC)    left   Breast cancer (HCC)    Carotid stenosis    1-39% left   Degenerative disc disease, lumbar    of the knees   Diabetes mellitus    Dysrhythmia    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HCC)    Encounter for therapeutic drug monitoring 08/17/2018   Fatigue    Severe secondary to to gemfibrozil   Gastroesophageal reflux disease    denies   GI bleeding    a. 06/2018: EGD showed multiple nonbleeding duodenal ulcers. 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   Hammertoe    left second toe   Hypercholesteremia    Hypertension    Insomnia    Irritable bowel    Low back pain    Metabolic syndrome    Morbid obesity (HCC)    OSA on CPAP \   bipap   Osteoarthritis    Osteopenia    Persistent atrial fibrillation [facility])    s/p afib ablation - Dr. Nunzio Belch 08/2018   Personal history of radiation therapy    Pneumonia    PONV (postoperative nausea and vomiting)    Sigmoid diverticulosis    Thalassemia minor    Unstable angina (HCC) 10/23/2020   Vitamin D  deficiency    Past Surgical History:  Procedure Laterality Date   ATRIAL FIBRILLATION ABLATION N/A 09/21/2018   Procedure: ATRIAL FIBRILLATION ABLATION;  Surgeon: Jolly Needle, MD;  Location: [facility]  INVASIVE CV LAB;  Service: Cardiovascular;  Laterality: N/A;   BIOPSY  07/06/2018   Procedure: BIOPSY;  Surgeon: Ozell Blunt, MD;  Location: [facility] ENDOSCOPY;  Service: Endoscopy;;   BREAST LUMPECTOMY Left 05/28/2009   BREAST SURGERY Left 2010   lumpectomy   CARDIOVERSION N/A 07/07/2018   Procedure: CARDIOVERSION;  Surgeon: Maudine Sos, MD;  Location: [facility] ENDOSCOPY;  Service: Cardiovascular;  Laterality: N/A;   CARDIOVERSION N/A 07/14/2018   Procedure: CARDIOVERSION;  Surgeon: Darlis Eisenmenger, MD;  Location: [facility] ENDOSCOPY;  Service: Cardiovascular;  Laterality: N/A;   CESAREAN SECTION     COLONOSCOPY WITH PROPOFOL  N/A 07/06/2018   Procedure: COLONOSCOPY WITH PROPOFOL ;  Surgeon: Ozell Blunt, MD;  Location: [facility] ENDOSCOPY;  Service: Endoscopy;  Laterality: N/A;   CYSTECTOMY  1970   pilonidal    DILATION AND CURETTAGE OF UTERUS     ESOPHAGOGASTRODUODENOSCOPY (EGD) WITH PROPOFOL  N/A 07/06/2018   Procedure: ESOPHAGOGASTRODUODENOSCOPY (EGD) WITH PROPOFOL ;  Surgeon: Ozell Blunt, MD;  Location: [facility] ENDOSCOPY;  Service: Endoscopy;  Laterality: N/A;   EYE SURGERY Bilateral 07/2013   cataracts   HOT HEMOSTASIS N/A 07/06/2018   Procedure: HOT HEMOSTASIS (ARGON PLASMA COAGULATION/BICAP);  Surgeon: Ozell Blunt, MD;  Location: [facility] ENDOSCOPY;  Service: Endoscopy;  Laterality: N/A;   implantable loop recorder placement  01/15/2022   [employer] Reveal Linq model [id number] implantable loop recorder (SN [id number]) implanted for AF management by Dr Nunzio Belch   MAXIMUM ACCESS (MAS)POSTERIOR LUMBAR INTERBODY FUSION (PLIF) 2 LEVEL N/A 09/22/2013   Procedure: FOR MAXIMUM ACCESS  POSTERIOR LUMBAR INTERBODY FUSION LUMBAR THREE-FOUR FOUR-FIVE;  Surgeon: Isadora Mar, MD;  Location: [facility] NEURO ORS;  Service: Neurosurgery;  Laterality: N/A;   OOPHORECTOMY     wedge section not rem   REPLACEMENT TOTAL KNEE Left 2011   REPLACEMENT TOTAL KNEE     RIGHT/LEFT HEART CATH AND CORONARY ANGIOGRAPHY N/A 10/26/2020   Procedure:  RIGHT/LEFT HEART CATH AND CORONARY ANGIOGRAPHY;  Surgeon: Arleen Lacer, MD;  Location: [facility] INVASIVE CV LAB;  Service: Cardiovascular;  Laterality: N/A;   TEE WITHOUT CARDIOVERSION N/A 07/07/2018   Procedure: TRANSESOPHAGEAL ECHOCARDIOGRAM (TEE);  Surgeon: Maudine Sos, MD;  Location: [facility] ENDOSCOPY;  Service: Cardiovascular;  Laterality: N/A;   TEE WITHOUT CARDIOVERSION N/A 09/21/2018   Procedure: TRANSESOPHAGEAL ECHOCARDIOGRAM (TEE);  Surgeon: Alroy Aspen Lela Purple, MD;  Location: [facility] ENDOSCOPY;  Service: Cardiovascular;  Laterality: N/A;   TONSILLECTOMY     TUBAL LIGATION     Patient Active Problem List   Diagnosis Date Noted   Hyperlipidemia 09/25/2023   Shortness of breath    Unstable angina (HCC) 10/23/2020   Hypercoagulable state due to paroxysmal atrial fibrillation (HCC) 10/15/2020   Encounter for therapeutic drug monitoring 08/17/2018   CKD (chronic kidney disease) stage 3, GFR 30-59 ml/min (HCC) 07/21/2018   Chronic anemia 07/21/2018   Anemia    Heme positive stool    Atrial fibrillation with RVR (HCC) 07/04/2018   Paroxysmal atrial fibrillation (HCC) 07/02/2018   Carotid stenosis    Bruit of left carotid artery 09/22/2017   Aortic stenosis    Heart murmur 09/09/2016   Morbid obesity (HCC) 02/05/2016   Dry eye 05/17/2015   Type 2 diabetes mellitus without complication (HCC) 08/10/2014   Breast cancer, left breast (HCC) 07/22/2014   Obesity, Class III, BMI 40-49.9 (morbid obesity) (HCC) 07/06/2014   Macular pseudohole 05/17/2014   History of surgical procedure 05/17/2014   OSA (obstructive sleep apnea) 11/17/2013   S/P lumbar spinal fusion 09/22/2013   Persistent atrial fibrillation (HCC) 09/11/2011    Class: Acute   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HCC) 09/11/2011    Class: Chronic   Essential hypertension 09/11/2011   Exogenous obesity 09/11/2011   Gastroesophageal reflux disease 09/11/2011   Thalassemia minor 09/11/2011    Class: Chronic   Breast cancer (HCC) 09/11/2011     Class: Chronic   Degenerative joint disease 09/11/2011    Class: Chronic   Irritable bowel disease 09/11/2011    Class: Chronic   Lumbar disc disease 09/11/2011    PCP: Candi Chafe, MD  REFERRING PROVIDER: Jann Melody, DO  REFERRING DIAG: chronic bag and knee pain  Rationale for Evaluation and Treatment: Rehabilitation  THERAPY DIAG:  Other low back pain  Muscle spasm of back  Unsteadiness on feet  Difficulty in walking, not elsewhere classified  Acute pain of right knee  ONSET DATE: 10/14/22  SUBJECTIVE:  SUBJECTIVE STATEMENT:  I think the last treatment helped, would like to try that again, just sore and stiff in the low back Patient reports that she feels that she is unsteady on her feet and walking.  She did have a fall this AM.  She reports that her feet got caught in the bed spread and she fell, she reports that had pain in the knees and the back before but now having an increase of LBP.  She has a history of low back surgery fusion L3-5  PERTINENT HISTORY:  See above  PAIN:  Are you having pain? 6/10 in the low back, worse with standing , walking and activity, sit and rest pain can be a 5/10 but it is always there, ache, feels weak int eh core, reports knees are sore today after she had a fall  PRECAUTIONS: Other: lumbar fusion 2015  WEIGHT BEARING RESTRICTIONS: No  FALLS:  Has patient fallen in last 6 months? Yes, one fall this AM  LIVING ENVIRONMENT: Lives with: lives with their family Lives in: House/apartment Stairs: Yes: Internal: 15 steps; can reach both Has following equipment at home: has a cane and uses at times  OCCUPATION: retired  PLOF: Independent and gardens, does Presenter, broadcasting, cooks and Lexicographer, shops  PATIENT GOALS: strengthen core and have less pain  NEXT MD  VISIT:   OBJECTIVE:   DIAGNOSTIC FINDINGS:  No recent x-rays   PATIENT SURVEYS:  ODI = 54% disability  COGNITION: Overall cognitive status: Within functional limits for tasks assessed     SENSATION: WFL  MUSCLE LENGTH: Very tight HS, piriformis and quads  POSTURE: rounded shoulders and forward head  PALPATION: Tender and tight in the low back mms, tightness iin the rhomboids and the upper traps, very sore in the right low back and into the buttock   LUMBAR ROM:  all motions increased right low back pain  AROM eval  Flexion Decreased 50%  Extension Decreased 100%  Right lateral flexion Decreased 75%  Left lateral flexion Decreased 75%  Right rotation   Left rotation    (Blank rows = not tested)   Shoulder ROM left : AROM   Flexion: 100, abduction 95, ER 30, IR 40 all increase pain and she is very painful with ER and abduction LOWER EXTREMITY ROM:     Active  Right eval Left eval  Hip flexion    Hip extension    Hip abduction    Hip adduction    Hip internal rotation    Hip external rotation    Knee flexion 10 5  Knee extension 95 100  Ankle dorsiflexion    Ankle plantarflexion    Ankle inversion    Ankle eversion     (Blank rows = not tested)  LOWER EXTREMITY MMT:    MMT Right eval Left eval  Hip flexion 4- 4-  Hip extension 4- 4-  Hip abduction 4- 4-  Hip adduction    Hip internal rotation    Hip external rotation    Knee flexion 3+ 4-  Knee extension 3+ 4-  Ankle dorsiflexion 4 4  Ankle plantarflexion    Ankle inversion    Ankle eversion     (Blank rows = not tested)  FUNCTIONAL TESTS:  ODI= 27/50 54% TUG:  33 seconds  GAIT: Distance walked: 80 feet Assistive device utilized: no device Level of assistance: Complete [place] Comments: slow, right foot turned out, limp on the right  TODAY'S TREATMENT:  DATE:   01/06/24 Nustep level 6 x 9 minutes 20# rows 20# lats Black tband trunk flexion and extension Feet on ball K2C, rotation, bridge and isometric abs Biceps, triceps and OH press 3# Passive LE stretches Green tband clamshells Gentle sheet traction   12/31/23 Nustep L 6 10 min Gait around the back parking [place], no rest, good pace 3 min with 1  standing rest breaks d/t pain in gluts and general leg pain Modified Dead lifts 5# 8x then 8x without wt 20# rows 2 sets 10 20# lats 2 sets 10 Black tband trunk flex and ext 20x Feet on ball K2C, rotation, small bridges, isometric abs Passive LE stretching    12/22/23 Nustep level 5 x 10 minutes 20# rows 20# lats 20# HS curls Feet on ball K2C, rotation, small bridges, isometric abs Green tband clamshells Ball b/n knees squeeze Passive stretch LE's  12/08/23 Nustep L 5 10 min Gait around the back parking [place], no rest, good pace 5 min but needed 2 standing rest breaks d/t pan in gluts/HS Feet on ball K2C, rotation, small bridge, isometric abs PROM BIL LE and trunk Black tband 20 x trunk flex and ext Rows 15# 2x10 Lats 15# 2x10 Black bar heel raise and toe raises 20x STS with wt ball press 10 x   12/01/23 Gait around the back parking [place], no rest, good pace Rows 15# 2x10 Lats 15# 2x10 Straight arm pulls 5# cues for core and posture 15# HS curls Nustep level 5 x 9 minutes PAssive LE stretches Feet on ball K2C, rotation, small bridge, isometric abs STM with T-gun to HS and ITB  11/24/23 Gait around the parking [place] 1 rest break SBA for curbs Nustep level 5 x 6 minutes Feet on ball K2C, rotation, small bridge, isometric abs Ball b/n knees squeeze Green tband clamshells in hooklying Passive stretch STM with the Tgun to the ITB and the HS  11/19/23 Nustep level 5 x 7 minutes Standing red tband row and extension Feet on ball K2C, small rotation, small bridge and isometric abs Red tband clamshells Ball b/n  knees squeeze LE stretches STM to the buttocks and the low back  11/09/23 Nustep L 5 8 min STS from mat with wt ball press 10 x Ball b/n knees squeeze 20x Green tband clamshells 2 sets 10 Green tband hip flex  2 sets 10 Seated red tband row and extension Black tband trunk flex and ext 2 sets 10 LAQ 2 sets 10 Green tband HS curl 2 sets 10  10/28/23 Nustep level 5 x 7 minutes Feet on ball K2C, trunk rotation, small bridge, isometric abs 20# leg curls Ball b/n knees squeeze Green tband clamshells Passive stretch of the LE's Seated red tband row and extension STM to the buttock and back  10/13/23 Nustep L 5 10 min- 2 rest needed for SOB/fatigue Black tband trunk flex and ext 2 sets 10 Seated row 20# 2 sets 10 LAQ , hip flex and hip abd # 2 sets 10 ADD ball squeeze 2 sets 10 Red tband HS curl 2 sets 10 Gentle stretching of LEs and trunk   09/30/23 Evaluation   PATIENT EDUCATION:  Education details: POC Person educated: Patient Education method: Explanation Education comprehension: verbalized understanding  HOME EXERCISE PROGRAM: 12/01/23 K2C, trunk rotation, HS and piriformis stretches  ASSESSMENT:  CLINICAL IMPRESSION: Doing better, moving better, still frustrated with her pain and stiffness, the stretches help and I tried some gentle sheet traction. Final HEP is evolving as  we progress towards D/C. Overall pt states pain is 50% better on most days but comes and goes. Pt states is unsure if knees and the way she walks causes back pain or if back pain is radiating to legs- frustrating.  OBJECTIVE IMPAIRMENTS: Abnormal gait, cardiopulmonary status limiting activity, decreased activity tolerance, decreased balance, decreased coordination, decreased endurance, decreased mobility, difficulty walking, decreased ROM, decreased strength, increased fascial restrictions, impaired perceived functional ability, increased muscle spasms, impaired flexibility, improper body mechanics, and  pain.   REHAB POTENTIAL: Good  CLINICAL DECISION MAKING: Stable/uncomplicated  EVALUATION COMPLEXITY: Low   GOALS: Goals reviewed with patient? Yes  SHORT TERM GOALS: Target date: 10/24/23  Independent with initial HEP Goal status: met 12/01/23  LONG TERM GOALS: Target date: 12/28/23  Independent wiht posture and body mechanics Goal status: MET 11/09/23  2.  Decrease TUG time to 15 seconds Goal status: MET 11/09/23  3.  Decrease pain 25% Goal status: ongoing 12/01/23  progressing 12/08/23  . 12/31/23 good and bad days overall 50% better ( back and knees pain - not sure what causes pain)  4.  Be able to cook a meal without pain > 5/10 Goal status: progressing 12/01/23 and 12/08/23. 12/31/23 MET  5.  Independent with HEP or gym Goal status: progressing 12/22/23  01/07/24 evolving  PLAN:  PT FREQUENCY: 1-2x/week  PT DURATION: 12 weeks  PLANNED INTERVENTIONS: Therapeutic exercises, Therapeutic activity, Neuromuscular re-education, Balance training, Gait training, Patient/Family education, Self Care, Joint mobilization, Dry Needling, Electrical stimulation, Cryotherapy, Moist heat, and Manual therapy.  PLAN FOR NEXT SESSION: continue to progress as tolerated today is visit 1 of 10 newly authorized   Hollis Lurie, PT 01/06/2024, 1:14 PM [facility] [address] [facility]. [place], [place], [address] Phone: [phone]   Fax:  [phone]

## 2024-01-07 ENCOUNTER — Ambulatory Visit: Admitting: Physical Therapy

## 2024-01-07 DIAGNOSIS — M545 Low back pain, unspecified: Secondary | ICD-10-CM | POA: Diagnosis not present

## 2024-01-07 DIAGNOSIS — I5032 Chronic diastolic (congestive) heart failure: Secondary | ICD-10-CM | POA: Diagnosis not present

## 2024-01-07 DIAGNOSIS — R2689 Other abnormalities of gait and mobility: Secondary | ICD-10-CM | POA: Diagnosis not present

## 2024-01-07 DIAGNOSIS — N1831 Chronic kidney disease, stage 3a: Secondary | ICD-10-CM | POA: Diagnosis not present

## 2024-01-07 DIAGNOSIS — E1169 Type 2 diabetes mellitus with other specified complication: Secondary | ICD-10-CM | POA: Diagnosis not present

## 2024-01-07 DIAGNOSIS — E1122 Type 2 diabetes mellitus with diabetic chronic kidney disease: Secondary | ICD-10-CM | POA: Diagnosis not present

## 2024-01-07 DIAGNOSIS — J310 Chronic rhinitis: Secondary | ICD-10-CM | POA: Diagnosis not present

## 2024-01-07 DIAGNOSIS — I480 Paroxysmal atrial fibrillation: Secondary | ICD-10-CM | POA: Diagnosis not present

## 2024-01-08 ENCOUNTER — Ambulatory Visit: Payer: Self-pay | Admitting: Cardiovascular Disease

## 2024-01-13 ENCOUNTER — Ambulatory Visit: Admitting: Physical Therapy

## 2024-01-13 ENCOUNTER — Encounter: Payer: Self-pay | Admitting: Physical Therapy

## 2024-01-13 DIAGNOSIS — M5459 Other low back pain: Secondary | ICD-10-CM

## 2024-01-13 DIAGNOSIS — M62830 Muscle spasm of back: Secondary | ICD-10-CM

## 2024-01-13 DIAGNOSIS — R2681 Unsteadiness on feet: Secondary | ICD-10-CM | POA: Diagnosis not present

## 2024-01-13 DIAGNOSIS — R262 Difficulty in walking, not elsewhere classified: Secondary | ICD-10-CM | POA: Diagnosis not present

## 2024-01-13 DIAGNOSIS — M25561 Pain in right knee: Secondary | ICD-10-CM

## 2024-01-13 NOTE — Therapy (Signed)
 OUTPATIENT PHYSICAL THERAPY THORACOLUMBAR     Patient Name: Jenna Mcdonald MRN: [id number] DOB:06/11/1946, 78 y.o., female Today's Date: 01/13/2024  END OF SESSION:  PT End of Session - 01/13/24 1010     Visit Number 12    Number of Visits 20    Date for PT Re-Evaluation 03/07/24    Authorization Type [employer] 2/10    PT Start Time 1011    PT Stop Time 1055    PT Time Calculation (min) 44 min    Activity Tolerance Patient tolerated treatment well    Behavior During Therapy [facility] for tasks assessed/performed              Past Medical History:  Diagnosis Date   Anemia    Aortic stenosis    mild by echo 09/2020 and 08/2022 with mean AVG   Breast CA (HCC)    left   Breast cancer (HCC)    Carotid stenosis    1-39% left   Degenerative disc disease, lumbar    of the knees   Diabetes mellitus    Dysrhythmia    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HCC)    Encounter for therapeutic drug monitoring 08/17/2018   Fatigue    Severe secondary to to gemfibrozil   Gastroesophageal reflux disease    denies   GI bleeding    a. 06/2018: EGD showed multiple nonbleeding duodenal ulcers. 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   Hammertoe    left second toe   Hypercholesteremia    Hypertension    Insomnia    Irritable bowel    Low back pain    Metabolic syndrome    Morbid obesity (HCC)    OSA on CPAP \   bipap   Osteoarthritis    Osteopenia    Persistent atrial fibrillation [facility])    s/p afib ablation - Dr. Nunzio Belch 08/2018   Personal history of radiation therapy    Pneumonia    PONV (postoperative nausea and vomiting)    Sigmoid diverticulosis    Thalassemia minor    Unstable angina (HCC) 10/23/2020   Vitamin D  deficiency    Past Surgical History:  Procedure Laterality Date   ATRIAL FIBRILLATION ABLATION N/A 09/21/2018   Procedure: ATRIAL FIBRILLATION ABLATION;  Surgeon: Jolly Needle, MD;  Location: [facility]  INVASIVE CV LAB;  Service: Cardiovascular;  Laterality: N/A;   BIOPSY  07/06/2018   Procedure: BIOPSY;  Surgeon: Ozell Blunt, MD;  Location: [facility] ENDOSCOPY;  Service: Endoscopy;;   BREAST LUMPECTOMY Left 05/28/2009   BREAST SURGERY Left 2010   lumpectomy   CARDIOVERSION N/A 07/07/2018   Procedure: CARDIOVERSION;  Surgeon: Maudine Sos, MD;  Location: [facility] ENDOSCOPY;  Service: Cardiovascular;  Laterality: N/A;   CARDIOVERSION N/A 07/14/2018   Procedure: CARDIOVERSION;  Surgeon: Darlis Eisenmenger, MD;  Location: [facility] ENDOSCOPY;  Service: Cardiovascular;  Laterality: N/A;   CESAREAN SECTION     COLONOSCOPY WITH PROPOFOL  N/A 07/06/2018   Procedure: COLONOSCOPY WITH PROPOFOL ;  Surgeon: Ozell Blunt, MD;  Location: [facility] ENDOSCOPY;  Service: Endoscopy;  Laterality: N/A;   CYSTECTOMY  1970   pilonidal    DILATION AND CURETTAGE OF UTERUS     ESOPHAGOGASTRODUODENOSCOPY (EGD) WITH PROPOFOL  N/A 07/06/2018   Procedure: ESOPHAGOGASTRODUODENOSCOPY (EGD) WITH PROPOFOL ;  Surgeon: Ozell Blunt, MD;  Location: [facility] ENDOSCOPY;  Service: Endoscopy;  Laterality: N/A;   EYE SURGERY Bilateral 07/2013   cataracts   HOT HEMOSTASIS N/A 07/06/2018   Procedure: HOT HEMOSTASIS (ARGON PLASMA COAGULATION/BICAP);  Surgeon: Ozell Blunt, MD;  Location: [facility] ENDOSCOPY;  Service: Endoscopy;  Laterality: N/A;   implantable loop recorder placement  01/15/2022   [employer] Reveal Linq model [id number] implantable loop recorder (SN [id number]) implanted for AF management by Dr Nunzio Belch   MAXIMUM ACCESS (MAS)POSTERIOR LUMBAR INTERBODY FUSION (PLIF) 2 LEVEL N/A 09/22/2013   Procedure: FOR MAXIMUM ACCESS  POSTERIOR LUMBAR INTERBODY FUSION LUMBAR THREE-FOUR FOUR-FIVE;  Surgeon: Isadora Mar, MD;  Location: [facility] NEURO ORS;  Service: Neurosurgery;  Laterality: N/A;   OOPHORECTOMY     wedge section not rem   REPLACEMENT TOTAL KNEE Left 2011   REPLACEMENT TOTAL KNEE     RIGHT/LEFT HEART CATH AND CORONARY ANGIOGRAPHY N/A 10/26/2020   Procedure:  RIGHT/LEFT HEART CATH AND CORONARY ANGIOGRAPHY;  Surgeon: Arleen Lacer, MD;  Location: [facility] INVASIVE CV LAB;  Service: Cardiovascular;  Laterality: N/A;   TEE WITHOUT CARDIOVERSION N/A 07/07/2018   Procedure: TRANSESOPHAGEAL ECHOCARDIOGRAM (TEE);  Surgeon: Maudine Sos, MD;  Location: [facility] ENDOSCOPY;  Service: Cardiovascular;  Laterality: N/A;   TEE WITHOUT CARDIOVERSION N/A 09/21/2018   Procedure: TRANSESOPHAGEAL ECHOCARDIOGRAM (TEE);  Surgeon: Alroy Aspen Lela Purple, MD;  Location: [facility] ENDOSCOPY;  Service: Cardiovascular;  Laterality: N/A;   TONSILLECTOMY     TUBAL LIGATION     Patient Active Problem List   Diagnosis Date Noted   Hyperlipidemia 09/25/2023   Shortness of breath    Unstable angina (HCC) 10/23/2020   Hypercoagulable state due to paroxysmal atrial fibrillation (HCC) 10/15/2020   Encounter for therapeutic drug monitoring 08/17/2018   CKD (chronic kidney disease) stage 3, GFR 30-59 ml/min (HCC) 07/21/2018   Chronic anemia 07/21/2018   Anemia    Heme positive stool    Atrial fibrillation with RVR (HCC) 07/04/2018   Paroxysmal atrial fibrillation (HCC) 07/02/2018   Carotid stenosis    Bruit of left carotid artery 09/22/2017   Aortic stenosis    Heart murmur 09/09/2016   Morbid obesity (HCC) 02/05/2016   Dry eye 05/17/2015   Type 2 diabetes mellitus without complication (HCC) 08/10/2014   Breast cancer, left breast (HCC) 07/22/2014   Obesity, Class III, BMI 40-49.9 (morbid obesity) (HCC) 07/06/2014   Macular pseudohole 05/17/2014   History of surgical procedure 05/17/2014   OSA (obstructive sleep apnea) 11/17/2013   S/P lumbar spinal fusion 09/22/2013   Persistent atrial fibrillation (HCC) 09/11/2011    Class: Acute   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HCC) 09/11/2011    Class: Chronic   Essential hypertension 09/11/2011   Exogenous obesity 09/11/2011   Gastroesophageal reflux disease 09/11/2011   Thalassemia minor 09/11/2011    Class: Chronic   Breast cancer (HCC) 09/11/2011     Class: Chronic   Degenerative joint disease 09/11/2011    Class: Chronic   Irritable bowel disease 09/11/2011    Class: Chronic   Lumbar disc disease 09/11/2011    PCP: Candi Chafe, MD  REFERRING PROVIDER: Jann Melody, DO  REFERRING DIAG: chronic bag and knee pain  Rationale for Evaluation and Treatment: Rehabilitation  THERAPY DIAG:  Other low back pain  Muscle spasm of back  Unsteadiness on feet  Difficulty in walking, not elsewhere classified  Acute pain of right knee  ONSET DATE: 10/14/22  SUBJECTIVE:  SUBJECTIVE STATEMENT:  I am really hurting today, don't know why, my back is sore and tender, everything hurts.  Pain is 6/10 Patient reports that she feels that she is unsteady on her feet and walking.  She did have a fall this AM.  She reports that her feet got caught in the bed spread and she fell, she reports that had pain in the knees and the back before but now having an increase of LBP.  She has a history of low back surgery fusion L3-5  PERTINENT HISTORY:  See above  PAIN:  Are you having pain? 6/10 in the low back, worse with standing , walking and activity, sit and rest pain can be a 5/10 but it is always there, ache, feels weak int eh core, reports knees are sore today after she had a fall  PRECAUTIONS: Other: lumbar fusion 2015  WEIGHT BEARING RESTRICTIONS: No  FALLS:  Has patient fallen in last 6 months? Yes, one fall this AM  LIVING ENVIRONMENT: Lives with: lives with their family Lives in: House/apartment Stairs: Yes: Internal: 15 steps; can reach both Has following equipment at home: has a cane and uses at times  OCCUPATION: retired  PLOF: Independent and gardens, does Presenter, broadcasting, cooks and Lexicographer, shops  PATIENT GOALS: strengthen core and have less pain  NEXT MD  VISIT:   OBJECTIVE:   DIAGNOSTIC FINDINGS:  No recent x-rays   PATIENT SURVEYS:  ODI = 54% disability  COGNITION: Overall cognitive status: Within functional limits for tasks assessed     SENSATION: WFL  MUSCLE LENGTH: Very tight HS, piriformis and quads  POSTURE: rounded shoulders and forward head  PALPATION: Tender and tight in the low back mms, tightness iin the rhomboids and the upper traps, very sore in the right low back and into the buttock   LUMBAR ROM:  all motions increased right low back pain  AROM eval  Flexion Decreased 50%  Extension Decreased 100%  Right lateral flexion Decreased 75%  Left lateral flexion Decreased 75%  Right rotation   Left rotation    (Blank rows = not tested)   Shoulder ROM left : AROM   Flexion: 100, abduction 95, ER 30, IR 40 all increase pain and she is very painful with ER and abduction LOWER EXTREMITY ROM:     Active  Right eval Left eval  Hip flexion    Hip extension    Hip abduction    Hip adduction    Hip internal rotation    Hip external rotation    Knee flexion 10 5  Knee extension 95 100  Ankle dorsiflexion    Ankle plantarflexion    Ankle inversion    Ankle eversion     (Blank rows = not tested)  LOWER EXTREMITY MMT:    MMT Right eval Left eval  Hip flexion 4- 4-  Hip extension 4- 4-  Hip abduction 4- 4-  Hip adduction    Hip internal rotation    Hip external rotation    Knee flexion 3+ 4-  Knee extension 3+ 4-  Ankle dorsiflexion 4 4  Ankle plantarflexion    Ankle inversion    Ankle eversion     (Blank rows = not tested)  FUNCTIONAL TESTS:  ODI= 27/50 54% TUG:  33 seconds  GAIT: Distance walked: 80 feet Assistive device utilized: no device Level of assistance: Complete Independence Comments: slow, right foot turned out, limp on the right  TODAY'S TREATMENT:  DATE:   01/13/24 Nustep level 4 x 5 minutes pain Feet on ball K2C, rotation, small bridge, isometric abs Passive LE stretches STM with the Tgun to the buttocks and the lumbar area  01/06/24 Nustep level 6 x 9 minutes 20# rows 20# lats Black tband trunk flexion and extension Feet on ball K2C, rotation, bridge and isometric abs Biceps, triceps and OH press 3# Passive LE stretches Green tband clamshells Gentle sheet traction   12/31/23 Nustep L 6 10 min Gait around the back parking [place], no rest, good pace 3 min with 1  standing rest breaks d/t pain in gluts and general leg pain Modified Dead lifts 5# 8x then 8x without wt 20# rows 2 sets 10 20# lats 2 sets 10 Black tband trunk flex and ext 20x Feet on ball K2C, rotation, small bridges, isometric abs Passive LE stretching    12/22/23 Nustep level 5 x 10 minutes 20# rows 20# lats 20# HS curls Feet on ball K2C, rotation, small bridges, isometric abs Green tband clamshells Ball b/n knees squeeze Passive stretch LE's  12/08/23 Nustep L 5 10 min Gait around the back parking [place], no rest, good pace 5 min but needed 2 standing rest breaks d/t pan in gluts/HS Feet on ball K2C, rotation, small bridge, isometric abs PROM BIL LE and trunk Black tband 20 x trunk flex and ext Rows 15# 2x10 Lats 15# 2x10 Black bar heel raise and toe raises 20x STS with wt ball press 10 x   12/01/23 Gait around the back parking [place], no rest, good pace Rows 15# 2x10 Lats 15# 2x10 Straight arm pulls 5# cues for core and posture 15# HS curls Nustep level 5 x 9 minutes PAssive LE stretches Feet on ball K2C, rotation, small bridge, isometric abs STM with T-gun to HS and ITB  11/24/23 Gait around the parking [place] 1 rest break SBA for curbs Nustep level 5 x 6 minutes Feet on ball K2C, rotation, small bridge, isometric abs Ball b/n knees squeeze Green tband clamshells in hooklying Passive stretch STM with the Tgun to the ITB and the  HS  11/19/23 Nustep level 5 x 7 minutes Standing red tband row and extension Feet on ball K2C, small rotation, small bridge and isometric abs Red tband clamshells Ball b/n knees squeeze LE stretches STM to the buttocks and the low back  11/09/23 Nustep L 5 8 min STS from mat with wt ball press 10 x Ball b/n knees squeeze 20x Green tband clamshells 2 sets 10 Green tband hip flex  2 sets 10 Seated red tband row and extension Black tband trunk flex and ext 2 sets 10 LAQ 2 sets 10 Green tband HS curl 2 sets 10  10/28/23 Nustep level 5 x 7 minutes Feet on ball K2C, trunk rotation, small bridge, isometric abs 20# leg curls Ball b/n knees squeeze Green tband clamshells Passive stretch of the LE's Seated red tband row and extension STM to the buttock and back  10/13/23 Nustep L 5 10 min- 2 rest needed for SOB/fatigue Black tband trunk flex and ext 2 sets 10 Seated row 20# 2 sets 10 LAQ , hip flex and hip abd # 2 sets 10 ADD ball squeeze 2 sets 10 Red tband HS curl 2 sets 10 Gentle stretching of LEs and trunk   09/30/23 Evaluation   PATIENT EDUCATION:  Education details: POC Person educated: Patient Education method: Explanation Education comprehension: verbalized understanding  HOME EXERCISE PROGRAM: 12/01/23 K2C, trunk rotation, HS and piriformis  stretches  ASSESSMENT:  CLINICAL IMPRESSION: Patient comes in and was in much more discomfort and did not want to walk, she had pain on the nustep and all other exercises seemed to also cause some pain.  She was very tender in the right buttock and hip area, we discussed her seeing her back MD since she is hurting more  OBJECTIVE IMPAIRMENTS: Abnormal gait, cardiopulmonary status limiting activity, decreased activity tolerance, decreased balance, decreased coordination, decreased endurance, decreased mobility, difficulty walking, decreased ROM, decreased strength, increased fascial restrictions, impaired perceived functional  ability, increased muscle spasms, impaired flexibility, improper body mechanics, and pain.   REHAB POTENTIAL: Good  CLINICAL DECISION MAKING: Stable/uncomplicated  EVALUATION COMPLEXITY: Low   GOALS: Goals reviewed with patient? Yes  SHORT TERM GOALS: Target date: 10/24/23  Independent with initial HEP Goal status: met 12/01/23  LONG TERM GOALS: Target date: 12/28/23  Independent wiht posture and body mechanics Goal status: MET 11/09/23  2.  Decrease TUG time to 15 seconds Goal status: MET 11/09/23  3.  Decrease pain 25% Goal status: ongoing 12/01/23  progressing 12/08/23  . 12/31/23 good and bad days overall 50% better ( back and knees pain - not sure what causes pain)  4.  Be able to cook a meal without pain > 5/10 Goal status: progressing 12/01/23 and 12/08/23. 12/31/23 MET  5.  Independent with HEP or gym Goal status: progressing 12/22/23  01/13/24 evolving  PLAN:  PT FREQUENCY: 1-2x/week  PT DURATION: 12 weeks  PLANNED INTERVENTIONS: Therapeutic exercises, Therapeutic activity, Neuromuscular re-education, Balance training, Gait training, Patient/Family education, Self Care, Joint mobilization, Dry Needling, Electrical stimulation, Cryotherapy, Moist heat, and Manual therapy.  PLAN FOR NEXT SESSION: continue to progress as tolerated today is visit 2 of 10 newly authorized, hurting more today, see if she is feeling better, does she need to see back MD   Hollis Lurie, PT 01/13/2024, 10:11 AM [facility]. [place], [place], [address] Phone: [phone]   Fax:  [phone]

## 2024-01-14 ENCOUNTER — Telehealth: Payer: Self-pay | Admitting: Cardiology

## 2024-01-14 DIAGNOSIS — S30860A Insect bite (nonvenomous) of lower back and pelvis, initial encounter: Secondary | ICD-10-CM | POA: Diagnosis not present

## 2024-01-14 MED ORDER — DILTIAZEM HCL ER COATED BEADS 120 MG PO CP24: 120.0000 mg | ORAL_CAPSULE | Freq: Every day | ORAL | 2 refills | Status: AC

## 2024-01-14 NOTE — Telephone Encounter (Signed)
 RX sent to requested Pharmacy

## 2024-01-14 NOTE — Telephone Encounter (Signed)
*  STAT* If patient is at the pharmacy, call can be transferred to refill team.   1. Which medications need to be refilled? (please list name of each medication and dose if known)   diltiazem  (CARDIZEM  CD) 120 MG 24 hr capsule   2. Would you like to learn more about the convenience, safety, & potential cost savings by using the [facility]?   3. Are you open to using the [facility] (Type [facility]. ).  4. Which pharmacy/location (including street and city if local pharmacy) is medication to be sent to?  [id number] - [place], [place] - [address]   5. Do they need a 30 day or 90 day supply?   90 days  Patient stated she is completely out of this medication.

## 2024-01-19 NOTE — Addendum Note (Signed)
 Addended by: Edra Govern D on: 01/19/2024 02:31 PM   Modules accepted: Orders

## 2024-01-19 NOTE — Progress Notes (Signed)
 [facility] Summary Report / Loop Recorder

## 2024-01-20 ENCOUNTER — Encounter: Payer: Self-pay | Admitting: Physical Therapy

## 2024-01-20 ENCOUNTER — Ambulatory Visit: Admitting: Physical Therapy

## 2024-01-20 DIAGNOSIS — M25561 Pain in right knee: Secondary | ICD-10-CM

## 2024-01-20 DIAGNOSIS — M62830 Muscle spasm of back: Secondary | ICD-10-CM | POA: Diagnosis not present

## 2024-01-20 DIAGNOSIS — R2681 Unsteadiness on feet: Secondary | ICD-10-CM | POA: Diagnosis not present

## 2024-01-20 DIAGNOSIS — R262 Difficulty in walking, not elsewhere classified: Secondary | ICD-10-CM | POA: Diagnosis not present

## 2024-01-20 DIAGNOSIS — M5459 Other low back pain: Secondary | ICD-10-CM | POA: Diagnosis not present

## 2024-01-20 NOTE — Therapy (Signed)
 OUTPATIENT PHYSICAL THERAPY THORACOLUMBAR     Patient Name: Jenna Mcdonald MRN: [id number] DOB:30-Dec-1945, 78 y.o., female Today's Date: 01/20/2024  END OF SESSION:  PT End of Session - 01/20/24 1011     Visit Number 13    Number of Visits 20    Date for PT Re-Evaluation 03/07/24    Authorization Type [employer] 3/10    PT Start Time 1010    PT Stop Time 1100    PT Time Calculation (min) 50 min    Activity Tolerance Patient tolerated treatment well    Behavior During Therapy [facility] for tasks assessed/performed              Past Medical History:  Diagnosis Date   Anemia    Aortic stenosis    mild by echo 09/2020 and 08/2022 with mean AVG   Breast CA (HCC)    left   Breast cancer (HCC)    Carotid stenosis    1-39% left   Degenerative disc disease, lumbar    of the knees   Diabetes mellitus    Dysrhythmia    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HCC)    Encounter for therapeutic drug monitoring 08/17/2018   Fatigue    Severe secondary to to gemfibrozil   Gastroesophageal reflux disease    denies   GI bleeding    a. 06/2018: EGD showed multiple nonbleeding duodenal ulcers. 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   Hammertoe    left second toe   Hypercholesteremia    Hypertension    Insomnia    Irritable bowel    Low back pain    Metabolic syndrome    Morbid obesity (HCC)    OSA on CPAP \   bipap   Osteoarthritis    Osteopenia    Persistent atrial fibrillation [facility])    s/p afib ablation - Dr. Nunzio Belch 08/2018   Personal history of radiation therapy    Pneumonia    PONV (postoperative nausea and vomiting)    Sigmoid diverticulosis    Thalassemia minor    Unstable angina (HCC) 10/23/2020   Vitamin D  deficiency    Past Surgical History:  Procedure Laterality Date   ATRIAL FIBRILLATION ABLATION N/A 09/21/2018   Procedure: ATRIAL FIBRILLATION ABLATION;  Surgeon: Jolly Needle, MD;  Location: [facility]  INVASIVE CV LAB;  Service: Cardiovascular;  Laterality: N/A;   BIOPSY  07/06/2018   Procedure: BIOPSY;  Surgeon: Ozell Blunt, MD;  Location: [facility] ENDOSCOPY;  Service: Endoscopy;;   BREAST LUMPECTOMY Left 05/28/2009   BREAST SURGERY Left 2010   lumpectomy   CARDIOVERSION N/A 07/07/2018   Procedure: CARDIOVERSION;  Surgeon: Maudine Sos, MD;  Location: [facility] ENDOSCOPY;  Service: Cardiovascular;  Laterality: N/A;   CARDIOVERSION N/A 07/14/2018   Procedure: CARDIOVERSION;  Surgeon: Darlis Eisenmenger, MD;  Location: [facility] ENDOSCOPY;  Service: Cardiovascular;  Laterality: N/A;   CESAREAN SECTION     COLONOSCOPY WITH PROPOFOL  N/A 07/06/2018   Procedure: COLONOSCOPY WITH PROPOFOL ;  Surgeon: Ozell Blunt, MD;  Location: [facility] ENDOSCOPY;  Service: Endoscopy;  Laterality: N/A;   CYSTECTOMY  1970   pilonidal    DILATION AND CURETTAGE OF UTERUS     ESOPHAGOGASTRODUODENOSCOPY (EGD) WITH PROPOFOL  N/A 07/06/2018   Procedure: ESOPHAGOGASTRODUODENOSCOPY (EGD) WITH PROPOFOL ;  Surgeon: Ozell Blunt, MD;  Location: [facility] ENDOSCOPY;  Service: Endoscopy;  Laterality: N/A;   EYE SURGERY Bilateral 07/2013   cataracts   HOT HEMOSTASIS N/A 07/06/2018   Procedure: HOT HEMOSTASIS (ARGON PLASMA COAGULATION/BICAP);  Surgeon: Ozell Blunt, MD;  Location: [facility] ENDOSCOPY;  Service: Endoscopy;  Laterality: N/A;   implantable loop recorder placement  01/15/2022   Medtronic Reveal Linq model [id number] implantable loop recorder (SN [id number]) implanted for AF management by Dr Nunzio Belch   MAXIMUM ACCESS (MAS)POSTERIOR LUMBAR INTERBODY FUSION (PLIF) 2 LEVEL N/A 09/22/2013   Procedure: FOR MAXIMUM ACCESS  POSTERIOR LUMBAR INTERBODY FUSION LUMBAR THREE-FOUR FOUR-FIVE;  Surgeon: Isadora Mar, MD;  Location: [facility] NEURO ORS;  Service: Neurosurgery;  Laterality: N/A;   OOPHORECTOMY     wedge section not rem   REPLACEMENT TOTAL KNEE Left 2011   REPLACEMENT TOTAL KNEE     RIGHT/LEFT HEART CATH AND CORONARY ANGIOGRAPHY N/A 10/26/2020   Procedure:  RIGHT/LEFT HEART CATH AND CORONARY ANGIOGRAPHY;  Surgeon: Arleen Lacer, MD;  Location: [facility] INVASIVE CV LAB;  Service: Cardiovascular;  Laterality: N/A;   TEE WITHOUT CARDIOVERSION N/A 07/07/2018   Procedure: TRANSESOPHAGEAL ECHOCARDIOGRAM (TEE);  Surgeon: Maudine Sos, MD;  Location: [facility] ENDOSCOPY;  Service: Cardiovascular;  Laterality: N/A;   TEE WITHOUT CARDIOVERSION N/A 09/21/2018   Procedure: TRANSESOPHAGEAL ECHOCARDIOGRAM (TEE);  Surgeon: Alroy Aspen Lela Purple, MD;  Location: [facility] ENDOSCOPY;  Service: Cardiovascular;  Laterality: N/A;   TONSILLECTOMY     TUBAL LIGATION     Patient Active Problem List   Diagnosis Date Noted   Hyperlipidemia 09/25/2023   Shortness of breath    Unstable angina (HCC) 10/23/2020   Hypercoagulable state due to paroxysmal atrial fibrillation (HCC) 10/15/2020   Encounter for therapeutic drug monitoring 08/17/2018   CKD (chronic kidney disease) stage 3, GFR 30-59 ml/min (HCC) 07/21/2018   Chronic anemia 07/21/2018   Anemia    Heme positive stool    Atrial fibrillation with RVR (HCC) 07/04/2018   Paroxysmal atrial fibrillation (HCC) 07/02/2018   Carotid stenosis    Bruit of left carotid artery 09/22/2017   Aortic stenosis    Heart murmur 09/09/2016   Morbid obesity (HCC) 02/05/2016   Dry eye 05/17/2015   Type 2 diabetes mellitus without complication (HCC) 08/10/2014   Breast cancer, left breast (HCC) 07/22/2014   Obesity, Class III, BMI 40-49.9 (morbid obesity) (HCC) 07/06/2014   Macular pseudohole 05/17/2014   History of surgical procedure 05/17/2014   OSA (obstructive sleep apnea) 11/17/2013   S/P lumbar spinal fusion 09/22/2013   Persistent atrial fibrillation (HCC) 09/11/2011    Class: Acute   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HCC) 09/11/2011    Class: Chronic   Essential hypertension 09/11/2011   Exogenous obesity 09/11/2011   Gastroesophageal reflux disease 09/11/2011   Thalassemia minor 09/11/2011    Class: Chronic   Breast cancer (HCC) 09/11/2011     Class: Chronic   Degenerative joint disease 09/11/2011    Class: Chronic   Irritable bowel disease 09/11/2011    Class: Chronic   Lumbar disc disease 09/11/2011    PCP: Candi Chafe, MD  REFERRING PROVIDER: Jann Melody, DO  REFERRING DIAG: chronic bag and knee pain  Rationale for Evaluation and Treatment: Rehabilitation  THERAPY DIAG:  Other low back pain  Muscle spasm of back  Unsteadiness on feet  Acute pain of right knee  Difficulty in walking, not elsewhere classified  ONSET DATE: 10/14/22  SUBJECTIVE:  SUBJECTIVE STATEMENT:  Patient reports that she is moving better and feeling a little better, still pain is about a 6/10. Patient reports that she feels that she is unsteady on her feet and walking.  She did have a fall this AM.  She reports that her feet got caught in the bed spread and she fell, she reports that had pain in the knees and the back before but now having an increase of LBP.  She has a history of low back surgery fusion L3-5  PERTINENT HISTORY:  See above  PAIN:  Are you having pain? 6/10 in the low back, worse with standing , walking and activity, sit and rest pain can be a 5/10 but it is always there, ache, feels weak int eh core, reports knees are sore today after she had a fall  PRECAUTIONS: Other: lumbar fusion 2015  WEIGHT BEARING RESTRICTIONS: No  FALLS:  Has patient fallen in last 6 months? Yes, one fall this AM  LIVING ENVIRONMENT: Lives with: lives with their family Lives in: House/apartment Stairs: Yes: Internal: 15 steps; can reach both Has following equipment at home: has a cane and uses at times  OCCUPATION: retired  PLOF: Independent and gardens, does Presenter, broadcasting, cooks and Lexicographer, shops  PATIENT GOALS: strengthen core and have less pain  NEXT MD VISIT:    OBJECTIVE:   DIAGNOSTIC FINDINGS:  No recent x-rays   PATIENT SURVEYS:  ODI = 54% disability  COGNITION: Overall cognitive status: Within functional limits for tasks assessed     SENSATION: WFL  MUSCLE LENGTH: Very tight HS, piriformis and quads  POSTURE: rounded shoulders and forward head  PALPATION: Tender and tight in the low back mms, tightness iin the rhomboids and the upper traps, very sore in the right low back and into the buttock   LUMBAR ROM:  all motions increased right low back pain  AROM eval  Flexion Decreased 50%  Extension Decreased 100%  Right lateral flexion Decreased 75%  Left lateral flexion Decreased 75%  Right rotation   Left rotation    (Blank rows = not tested)   Shoulder ROM left : AROM   Flexion: 100, abduction 95, ER 30, IR 40 all increase pain and she is very painful with ER and abduction LOWER EXTREMITY ROM:     Active  Right eval Left eval  Hip flexion    Hip extension    Hip abduction    Hip adduction    Hip internal rotation    Hip external rotation    Knee flexion 10 5  Knee extension 95 100  Ankle dorsiflexion    Ankle plantarflexion    Ankle inversion    Ankle eversion     (Blank rows = not tested)  LOWER EXTREMITY MMT:    MMT Right eval Left eval  Hip flexion 4- 4-  Hip extension 4- 4-  Hip abduction 4- 4-  Hip adduction    Hip internal rotation    Hip external rotation    Knee flexion 3+ 4-  Knee extension 3+ 4-  Ankle dorsiflexion 4 4  Ankle plantarflexion    Ankle inversion    Ankle eversion     (Blank rows = not tested)  FUNCTIONAL TESTS:  ODI= 27/50 54% TUG:  33 seconds  GAIT: Distance walked: 80 feet Assistive device utilized: no device Level of assistance: Complete Independence Comments: slow, right foot turned out, limp on the right  TODAY'S TREATMENT:  DATE:    01/20/24 Nustep level 5 x 6 minutes Leg curls 15# 2x10 Seated rows 20# Lats 20# Black tband extension and flexion for core Standing 2.5# hip abduction, marching Feet on ball K2C, rotation, small bridge, isometric abs Passive stretch HS and piriformis Ball b/n knees squeeze Blue tband clamshells  01/13/24 Nustep level 4 x 5 minutes pain Feet on ball K2C, rotation, small bridge, isometric abs Passive LE stretches STM with the Tgun to the buttocks and the lumbar area  01/06/24 Nustep level 6 x 9 minutes 20# rows 20# lats Black tband trunk flexion and extension Feet on ball K2C, rotation, bridge and isometric abs Biceps, triceps and OH press 3# Passive LE stretches Green tband clamshells Gentle sheet traction   12/31/23 Nustep L 6 10 min Gait around the back parking [place], no rest, good pace 3 min with 1  standing rest breaks d/t pain in gluts and general leg pain Modified Dead lifts 5# 8x then 8x without wt 20# rows 2 sets 10 20# lats 2 sets 10 Black tband trunk flex and ext 20x Feet on ball K2C, rotation, small bridges, isometric abs Passive LE stretching    12/22/23 Nustep level 5 x 10 minutes 20# rows 20# lats 20# HS curls Feet on ball K2C, rotation, small bridges, isometric abs Green tband clamshells Ball b/n knees squeeze Passive stretch LE's  12/08/23 Nustep L 5 10 min Gait around the back parking [place], no rest, good pace 5 min but needed 2 standing rest breaks d/t pan in gluts/HS Feet on ball K2C, rotation, small bridge, isometric abs PROM BIL LE and trunk Black tband 20 x trunk flex and ext Rows 15# 2x10 Lats 15# 2x10 Black bar heel raise and toe raises 20x STS with wt ball press 10 x   12/01/23 Gait around the back parking [place], no rest, good pace Rows 15# 2x10 Lats 15# 2x10 Straight arm pulls 5# cues for core and posture 15# HS curls Nustep level 5 x 9 minutes PAssive LE stretches Feet on ball K2C, rotation, small bridge, isometric abs STM  with T-gun to HS and ITB  11/24/23 Gait around the parking [place] 1 rest break SBA for curbs Nustep level 5 x 6 minutes Feet on ball K2C, rotation, small bridge, isometric abs Ball b/n knees squeeze Green tband clamshells in hooklying Passive stretch STM with the Tgun to the ITB and the HS  11/19/23 Nustep level 5 x 7 minutes Standing red tband row and extension Feet on ball K2C, small rotation, small bridge and isometric abs Red tband clamshells Ball b/n knees squeeze LE stretches STM to the buttocks and the low back  11/09/23 Nustep L 5 8 min STS from mat with wt ball press 10 x Ball b/n knees squeeze 20x Green tband clamshells 2 sets 10 Green tband hip flex  2 sets 10 Seated red tband row and extension Black tband trunk flex and ext 2 sets 10 LAQ 2 sets 10 Green tband HS curl 2 sets 10  PATIENT EDUCATION:  Education details: POC Person educated: Patient Education method: Explanation Education comprehension: verbalized understanding  HOME EXERCISE PROGRAM: 12/01/23 K2C, trunk rotation, HS and piriformis stretches  ASSESSMENT:  CLINICAL IMPRESSION: Patient reports that she is feeling better than last week, her pain level is still 6/10, she is moving better, She still is reporting an increase of pain when starting the exercises, she then is able to do and has less pain once she gets moving, she does report not  being able to do much over the weekend due to the rainy weather, reports stiff and sore all over.  She wants to continue PT to see if she can get back to walking for exercise  OBJECTIVE IMPAIRMENTS: Abnormal gait, cardiopulmonary status limiting activity, decreased activity tolerance, decreased balance, decreased coordination, decreased endurance, decreased mobility, difficulty walking, decreased ROM, decreased strength, increased fascial restrictions, impaired perceived functional ability, increased muscle spasms, impaired flexibility, improper body mechanics, and pain.    REHAB POTENTIAL: Good  CLINICAL DECISION MAKING: Stable/uncomplicated  EVALUATION COMPLEXITY: Low   GOALS: Goals reviewed with patient? Yes  SHORT TERM GOALS: Target date: 10/24/23  Independent with initial HEP Goal status: met 12/01/23  LONG TERM GOALS: Target date: 12/28/23  Independent wiht posture and body mechanics Goal status: MET 11/09/23  2.  Decrease TUG time to 15 seconds Goal status: MET 11/09/23  3.  Decrease pain 25% Goal status: ongoing 12/01/23  progressing 12/08/23  . 12/31/23 good and bad days overall 50% better ( back and knees pain - not sure what causes pain)  4.  Be able to cook a meal without pain > 5/10 Goal status: progressing 12/01/23 and 12/08/23. 12/31/23 MET  5.  Independent with HEP or gym Goal status: progressing 12/22/23  01/13/24 evolving  PLAN:  PT FREQUENCY: 1-2x/week  PT DURATION: 12 weeks  PLANNED INTERVENTIONS: Therapeutic exercises, Therapeutic activity, Neuromuscular re-education, Balance training, Gait training, Patient/Family education, Self Care, Joint mobilization, Dry Needling, Electrical stimulation, Cryotherapy, Moist heat, and Manual therapy.  PLAN FOR NEXT SESSION: continue to progress as tolerated today is visit 3 of 10 newly authorized, hurting more today, see if she is feeling better, does she need to see back MD   Hollis Lurie, PT 01/20/2024, 10:14 AM [facility] [place], [place], [address] Phone: [phone]   Fax:  [phone]

## 2024-01-27 ENCOUNTER — Ambulatory Visit: Attending: Internal Medicine | Admitting: Physical Therapy

## 2024-01-27 ENCOUNTER — Encounter: Payer: Self-pay | Admitting: Physical Therapy

## 2024-01-27 DIAGNOSIS — M62830 Muscle spasm of back: Secondary | ICD-10-CM

## 2024-01-27 DIAGNOSIS — R2681 Unsteadiness on feet: Secondary | ICD-10-CM

## 2024-01-27 DIAGNOSIS — M5459 Other low back pain: Secondary | ICD-10-CM

## 2024-01-27 DIAGNOSIS — M25561 Pain in right knee: Secondary | ICD-10-CM

## 2024-01-27 DIAGNOSIS — R262 Difficulty in walking, not elsewhere classified: Secondary | ICD-10-CM

## 2024-01-27 NOTE — Therapy (Signed)
 OUTPATIENT PHYSICAL THERAPY THORACOLUMBAR     Patient Name: Jenna Mcdonald MRN: [id number] DOB:06-Jun-1946, 78 y.o., female Today's Date: 01/27/2024  END OF SESSION:  PT End of Session - 01/27/24 0933     Visit Number 14    Number of Visits 20    Date for PT Re-Evaluation 03/07/24    Authorization Type [employer] 4/10    PT Start Time 0927    PT Stop Time 1013    PT Time Calculation (min) 46 min    Activity Tolerance Patient tolerated treatment well    Behavior During Therapy [facility] for tasks assessed/performed              Past Medical History:  Diagnosis Date   Anemia    Aortic stenosis    mild by echo 09/2020 and 08/2022 with mean AVG   Breast CA (HCC)    left   Breast cancer (HCC)    Carotid stenosis    1-39% left   Degenerative disc disease, lumbar    of the knees   Diabetes mellitus    Dysrhythmia    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HCC)    Encounter for therapeutic drug monitoring 08/17/2018   Fatigue    Severe secondary to to gemfibrozil   Gastroesophageal reflux disease    denies   GI bleeding    a. 06/2018: EGD showed multiple nonbleeding duodenal ulcers. 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   Hammertoe    left second toe   Hypercholesteremia    Hypertension    Insomnia    Irritable bowel    Low back pain    Metabolic syndrome    Morbid obesity (HCC)    OSA on CPAP \   bipap   Osteoarthritis    Osteopenia    Persistent atrial fibrillation [facility])    s/p afib ablation - Dr. Nunzio Belch 08/2018   Personal history of radiation therapy    Pneumonia    PONV (postoperative nausea and vomiting)    Sigmoid diverticulosis    Thalassemia minor    Unstable angina (HCC) 10/23/2020   Vitamin D  deficiency    Past Surgical History:  Procedure Laterality Date   ATRIAL FIBRILLATION ABLATION N/A 09/21/2018   Procedure: ATRIAL FIBRILLATION ABLATION;  Surgeon: Jolly Needle, MD;  Location: [facility]  INVASIVE CV LAB;  Service: Cardiovascular;  Laterality: N/A;   BIOPSY  07/06/2018   Procedure: BIOPSY;  Surgeon: Ozell Blunt, MD;  Location: [facility] ENDOSCOPY;  Service: Endoscopy;;   BREAST LUMPECTOMY Left 05/28/2009   BREAST SURGERY Left 2010   lumpectomy   CARDIOVERSION N/A 07/07/2018   Procedure: CARDIOVERSION;  Surgeon: Maudine Sos, MD;  Location: [facility] ENDOSCOPY;  Service: Cardiovascular;  Laterality: N/A;   CARDIOVERSION N/A 07/14/2018   Procedure: CARDIOVERSION;  Surgeon: Darlis Eisenmenger, MD;  Location: [facility] ENDOSCOPY;  Service: Cardiovascular;  Laterality: N/A;   CESAREAN SECTION     COLONOSCOPY WITH PROPOFOL  N/A 07/06/2018   Procedure: COLONOSCOPY WITH PROPOFOL ;  Surgeon: Ozell Blunt, MD;  Location: [facility] ENDOSCOPY;  Service: Endoscopy;  Laterality: N/A;   CYSTECTOMY  1970   pilonidal    DILATION AND CURETTAGE OF UTERUS     ESOPHAGOGASTRODUODENOSCOPY (EGD) WITH PROPOFOL  N/A 07/06/2018   Procedure: ESOPHAGOGASTRODUODENOSCOPY (EGD) WITH PROPOFOL ;  Surgeon: Ozell Blunt, MD;  Location: [facility] ENDOSCOPY;  Service: Endoscopy;  Laterality: N/A;   EYE SURGERY Bilateral 07/2013   cataracts   HOT HEMOSTASIS N/A 07/06/2018   Procedure: HOT HEMOSTASIS (ARGON PLASMA COAGULATION/BICAP);  Surgeon: Ozell Blunt, MD;  Location: [facility] ENDOSCOPY;  Service: Endoscopy;  Laterality: N/A;   implantable loop recorder placement  01/15/2022   [employer] Reveal Linq model [id number] implantable loop recorder (SN [id number]) implanted for AF management by Dr Nunzio Belch   MAXIMUM ACCESS (MAS)POSTERIOR LUMBAR INTERBODY FUSION (PLIF) 2 LEVEL N/A 09/22/2013   Procedure: FOR MAXIMUM ACCESS  POSTERIOR LUMBAR INTERBODY FUSION LUMBAR THREE-FOUR FOUR-FIVE;  Surgeon: Isadora Mar, MD;  Location: [facility] NEURO ORS;  Service: Neurosurgery;  Laterality: N/A;   OOPHORECTOMY     wedge section not rem   REPLACEMENT TOTAL KNEE Left 2011   REPLACEMENT TOTAL KNEE     RIGHT/LEFT HEART CATH AND CORONARY ANGIOGRAPHY N/A 10/26/2020   Procedure:  RIGHT/LEFT HEART CATH AND CORONARY ANGIOGRAPHY;  Surgeon: Arleen Lacer, MD;  Location: [facility] INVASIVE CV LAB;  Service: Cardiovascular;  Laterality: N/A;   TEE WITHOUT CARDIOVERSION N/A 07/07/2018   Procedure: TRANSESOPHAGEAL ECHOCARDIOGRAM (TEE);  Surgeon: Maudine Sos, MD;  Location: [facility] ENDOSCOPY;  Service: Cardiovascular;  Laterality: N/A;   TEE WITHOUT CARDIOVERSION N/A 09/21/2018   Procedure: TRANSESOPHAGEAL ECHOCARDIOGRAM (TEE);  Surgeon: Alroy Aspen Lela Purple, MD;  Location: [facility] ENDOSCOPY;  Service: Cardiovascular;  Laterality: N/A;   TONSILLECTOMY     TUBAL LIGATION     Patient Active Problem List   Diagnosis Date Noted   Hyperlipidemia 09/25/2023   Shortness of breath    Unstable angina (HCC) 10/23/2020   Hypercoagulable state due to paroxysmal atrial fibrillation (HCC) 10/15/2020   Encounter for therapeutic drug monitoring 08/17/2018   CKD (chronic kidney disease) stage 3, GFR 30-59 ml/min (HCC) 07/21/2018   Chronic anemia 07/21/2018   Anemia    Heme positive stool    Atrial fibrillation with RVR (HCC) 07/04/2018   Paroxysmal atrial fibrillation (HCC) 07/02/2018   Carotid stenosis    Bruit of left carotid artery 09/22/2017   Aortic stenosis    Heart murmur 09/09/2016   Morbid obesity (HCC) 02/05/2016   Dry eye 05/17/2015   Type 2 diabetes mellitus without complication (HCC) 08/10/2014   Breast cancer, left breast (HCC) 07/22/2014   Obesity, Class III, BMI 40-49.9 (morbid obesity) (HCC) 07/06/2014   Macular pseudohole 05/17/2014   History of surgical procedure 05/17/2014   OSA (obstructive sleep apnea) 11/17/2013   S/P lumbar spinal fusion 09/22/2013   Persistent atrial fibrillation (HCC) 09/11/2011    Class: Acute   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HCC) 09/11/2011    Class: Chronic   Essential hypertension 09/11/2011   Exogenous obesity 09/11/2011   Gastroesophageal reflux disease 09/11/2011   Thalassemia minor 09/11/2011    Class: Chronic   Breast cancer (HCC) 09/11/2011     Class: Chronic   Degenerative joint disease 09/11/2011    Class: Chronic   Irritable bowel disease 09/11/2011    Class: Chronic   Lumbar disc disease 09/11/2011    PCP: Candi Chafe, MD  REFERRING PROVIDER: Jann Melody, DO  REFERRING DIAG: chronic bag and knee pain  Rationale for Evaluation and Treatment: Rehabilitation  THERAPY DIAG:  Other low back pain  Muscle spasm of back  Unsteadiness on feet  Acute pain of right knee  Difficulty in walking, not elsewhere classified  ONSET DATE: 10/14/22  SUBJECTIVE:  SUBJECTIVE STATEMENT:  Patient reports that she has been feeling a little better, less pain overall still pain across the low back Patient reports that she feels that she is unsteady on her feet and walking.  She did have a fall this AM.  She reports that her feet got caught in the bed spread and she fell, she reports that had pain in the knees and the back before but now having an increase of LBP.  She has a history of low back surgery fusion L3-5  PERTINENT HISTORY:  See above  PAIN:  Are you having pain? 5/10 in the low back, worse with standing , walking and activity, sit and rest pain can be a 5/10 but it is always there, ache, feels weak int eh core, reports knees are sore today after she had a fall  PRECAUTIONS: Other: lumbar fusion 2015  WEIGHT BEARING RESTRICTIONS: No  FALLS:  Has patient fallen in last 6 months? Yes, one fall this AM  LIVING ENVIRONMENT: Lives with: lives with their family Lives in: House/apartment Stairs: Yes: Internal: 15 steps; can reach both Has following equipment at home: has a cane and uses at times  OCCUPATION: retired  PLOF: Independent and gardens, does Presenter, broadcasting, cooks and Lexicographer, shops  PATIENT GOALS: strengthen core and have less pain  NEXT  MD VISIT:   OBJECTIVE:   DIAGNOSTIC FINDINGS:  No recent x-rays   PATIENT SURVEYS:  ODI = 54% disability  COGNITION: Overall cognitive status: Within functional limits for tasks assessed     SENSATION: WFL  MUSCLE LENGTH: Very tight HS, piriformis and quads  POSTURE: rounded shoulders and forward head  PALPATION: Tender and tight in the low back mms, tightness iin the rhomboids and the upper traps, very sore in the right low back and into the buttock   LUMBAR ROM:  all motions increased right low back pain  AROM eval  Flexion Decreased 50%  Extension Decreased 100%  Right lateral flexion Decreased 75%  Left lateral flexion Decreased 75%  Right rotation   Left rotation    (Blank rows = not tested)   Shoulder ROM left : AROM   Flexion: 100, abduction 95, ER 30, IR 40 all increase pain and she is very painful with ER and abduction LOWER EXTREMITY ROM:     Active  Right eval Left eval  Hip flexion    Hip extension    Hip abduction    Hip adduction    Hip internal rotation    Hip external rotation    Knee flexion 10 5  Knee extension 95 100  Ankle dorsiflexion    Ankle plantarflexion    Ankle inversion    Ankle eversion     (Blank rows = not tested)  LOWER EXTREMITY MMT:    MMT Right eval Left eval  Hip flexion 4- 4-  Hip extension 4- 4-  Hip abduction 4- 4-  Hip adduction    Hip internal rotation    Hip external rotation    Knee flexion 3+ 4-  Knee extension 3+ 4-  Ankle dorsiflexion 4 4  Ankle plantarflexion    Ankle inversion    Ankle eversion     (Blank rows = not tested)  FUNCTIONAL TESTS:  ODI= 27/50 54% TUG:  33 seconds  GAIT: Distance walked: 80 feet Assistive device utilized: no device Level of assistance: Complete Independence Comments: slow, right foot turned out, limp on the right  TODAY'S TREATMENT:  DATE:   01/27/24 Nustep level 5 x 10 minutes 20# seated row 20# lats Black tband abs and extension 15# leg curls Feet on ball K2C, rotation, small bridge, isometric abs Passive LE stretches  01/20/24 Nustep level 5 x 6 minutes Leg curls 15# 2x10 Seated rows 20# Lats 20# Black tband extension and flexion for core Standing 2.5# hip abduction, marching Feet on ball K2C, rotation, small bridge, isometric abs Passive stretch HS and piriformis Ball b/n knees squeeze Blue tband clamshells  01/13/24 Nustep level 4 x 5 minutes pain Feet on ball K2C, rotation, small bridge, isometric abs Passive LE stretches STM with the Tgun to the buttocks and the lumbar area  01/06/24 Nustep level 6 x 9 minutes 20# rows 20# lats Black tband trunk flexion and extension Feet on ball K2C, rotation, bridge and isometric abs Biceps, triceps and OH press 3# Passive LE stretches Green tband clamshells Gentle sheet traction   12/31/23 Nustep L 6 10 min Gait around the back parking [place], no rest, good pace 3 min with 1  standing rest breaks d/t pain in gluts and general leg pain Modified Dead lifts 5# 8x then 8x without wt 20# rows 2 sets 10 20# lats 2 sets 10 Black tband trunk flex and ext 20x Feet on ball K2C, rotation, small bridges, isometric abs Passive LE stretching    12/22/23 Nustep level 5 x 10 minutes 20# rows 20# lats 20# HS curls Feet on ball K2C, rotation, small bridges, isometric abs Green tband clamshells Ball b/n knees squeeze Passive stretch LE's  12/08/23 Nustep L 5 10 min Gait around the back parking [place], no rest, good pace 5 min but needed 2 standing rest breaks d/t pan in gluts/HS Feet on ball K2C, rotation, small bridge, isometric abs PROM BIL LE and trunk Black tband 20 x trunk flex and ext Rows 15# 2x10 Lats 15# 2x10 Black bar heel raise and toe raises 20x STS with wt ball press 10 x   12/01/23 Gait around the back parking [place], no rest, good pace Rows  15# 2x10 Lats 15# 2x10 Straight arm pulls 5# cues for core and posture 15# HS curls Nustep level 5 x 9 minutes PAssive LE stretches Feet on ball K2C, rotation, small bridge, isometric abs STM with T-gun to HS and ITB  11/24/23 Gait around the parking [place] 1 rest break SBA for curbs Nustep level 5 x 6 minutes Feet on ball K2C, rotation, small bridge, isometric abs Ball b/n knees squeeze Green tband clamshells in hooklying Passive stretch STM with the Tgun to the ITB and the HS  11/19/23 Nustep level 5 x 7 minutes Standing red tband row and extension Feet on ball K2C, small rotation, small bridge and isometric abs Red tband clamshells Ball b/n knees squeeze LE stretches STM to the buttocks and the low back  11/09/23 Nustep L 5 8 min STS from mat with wt ball press 10 x Ball b/n knees squeeze 20x Green tband clamshells 2 sets 10 Green tband hip flex  2 sets 10 Seated red tband row and extension Black tband trunk flex and ext 2 sets 10 LAQ 2 sets 10 Green tband HS curl 2 sets 10  PATIENT EDUCATION:  Education details: POC Person educated: Patient Education method: Explanation Education comprehension: verbalized understanding  HOME EXERCISE PROGRAM: 12/01/23 K2C, trunk rotation, HS and piriformis stretches  ASSESSMENT:  CLINICAL IMPRESSION: Patient reports that she is feeling better than last week, her pain level this week is 5/10, she  is moving better, I am trying to return her to the previous exercises and she is tolerating okay, still some right knee pain so trying to avoid some of the LE exercises  She wants to continue PT to see if she can get back to walking for exercise  OBJECTIVE IMPAIRMENTS: Abnormal gait, cardiopulmonary status limiting activity, decreased activity tolerance, decreased balance, decreased coordination, decreased endurance, decreased mobility, difficulty walking, decreased ROM, decreased strength, increased fascial restrictions, impaired perceived  functional ability, increased muscle spasms, impaired flexibility, improper body mechanics, and pain.   REHAB POTENTIAL: Good  CLINICAL DECISION MAKING: Stable/uncomplicated  EVALUATION COMPLEXITY: Low   GOALS: Goals reviewed with patient? Yes  SHORT TERM GOALS: Target date: 10/24/23  Independent with initial HEP Goal status: met 12/01/23  LONG TERM GOALS: Target date: 12/28/23  Independent wiht posture and body mechanics Goal status: MET 11/09/23  2.  Decrease TUG time to 15 seconds Goal status: MET 11/09/23  3.  Decrease pain 25% Goal status: ongoing 12/01/23  progressing 12/08/23  . 01/27/24 good and bad days overall 50% better ( back and knees pain - not sure what causes pain)  4.  Be able to cook a meal without pain > 5/10 Goal status: progressing 12/01/23 and 12/08/23. 12/31/23 MET  5.  Independent with HEP or gym Goal status: progressing 12/22/23  01/13/24 evolving progressing 01/27/24  PLAN:  PT FREQUENCY: 1-2x/week  PT DURATION: 12 weeks  PLANNED INTERVENTIONS: Therapeutic exercises, Therapeutic activity, Neuromuscular re-education, Balance training, Gait training, Patient/Family education, Self Care, Joint mobilization, Dry Needling, Electrical stimulation, Cryotherapy, Moist heat, and Manual therapy.  PLAN FOR NEXT SESSION: continue to progress as tolerated today is visit 4 of 10 newly authorized, hurting more today, see if she is feeling better, does she need to see back MD   Hollis Lurie, PT 01/27/2024, 9:34 AM [facility] [address] [facility]. [place], [place], [address] Phone: [phone]   Fax:  [phone]

## 2024-01-28 DIAGNOSIS — R3915 Urgency of urination: Secondary | ICD-10-CM | POA: Diagnosis not present

## 2024-01-28 DIAGNOSIS — N39498 Other specified urinary incontinence: Secondary | ICD-10-CM | POA: Diagnosis not present

## 2024-02-03 ENCOUNTER — Encounter: Payer: Self-pay | Admitting: Physical Therapy

## 2024-02-03 ENCOUNTER — Ambulatory Visit: Admitting: Physical Therapy

## 2024-02-03 DIAGNOSIS — R2681 Unsteadiness on feet: Secondary | ICD-10-CM

## 2024-02-03 DIAGNOSIS — R262 Difficulty in walking, not elsewhere classified: Secondary | ICD-10-CM | POA: Diagnosis not present

## 2024-02-03 DIAGNOSIS — M25561 Pain in right knee: Secondary | ICD-10-CM

## 2024-02-03 DIAGNOSIS — M5459 Other low back pain: Secondary | ICD-10-CM

## 2024-02-03 DIAGNOSIS — M62830 Muscle spasm of back: Secondary | ICD-10-CM

## 2024-02-03 NOTE — Therapy (Signed)
 OUTPATIENT PHYSICAL THERAPY THORACOLUMBAR     Patient Name: Jenna Mcdonald MRN: [id number] DOB:[birth year], 78 y.o., female Today's Date: 02/03/2024  END OF SESSION:  PT End of Session - 02/03/24 0932     Visit Number 15    Number of Visits 20    Date for PT Re-Evaluation 03/07/24    Authorization Type [employer] 5/10    PT Start Time 0928    PT Stop Time 1015    PT Time Calculation (min) 47 min    Activity Tolerance Patient tolerated treatment well    Behavior During Therapy [facility] for tasks assessed/performed              Past Medical History:  Diagnosis Date   Anemia    Aortic stenosis    mild by echo 09/2020 and 08/2022 with mean AVG   Breast CA (HCC)    left   Breast cancer (HCC)    Carotid stenosis    1-39% left   Degenerative disc disease, lumbar    of the knees   Diabetes mellitus    Dysrhythmia    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HCC)    Encounter for therapeutic drug monitoring 08/17/2018   Fatigue    Severe secondary to to gemfibrozil   Gastroesophageal reflux disease    denies   GI bleeding    a. 06/2018: EGD showed multiple nonbleeding duodenal ulcers. 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   Hammertoe    left second toe   Hypercholesteremia    Hypertension    Insomnia    Irritable bowel    Low back pain    Metabolic syndrome    Morbid obesity (HCC)    OSA on CPAP \   bipap   Osteoarthritis    Osteopenia    Persistent atrial fibrillation [facility]    s/p afib ablation - Dr. Nunzio Belch 08/2018   Personal history of radiation therapy    Pneumonia    PONV (postoperative nausea and vomiting)    Sigmoid diverticulosis    Thalassemia minor    Unstable angina (HCC) 10/23/2020   Vitamin D  deficiency    Past Surgical History:  Procedure Laterality Date   ATRIAL FIBRILLATION ABLATION N/A 09/21/2018   Procedure: ATRIAL FIBRILLATION ABLATION;  Surgeon: Jolly Needle, MD;  Location: [facility]  INVASIVE CV LAB;  Service: Cardiovascular;  Laterality: N/A;   BIOPSY  07/06/2018   Procedure: BIOPSY;  Surgeon: Ozell Blunt, MD;  Location: [facility] ENDOSCOPY;  Service: Endoscopy;;   BREAST LUMPECTOMY Left 05/28/2009   BREAST SURGERY Left 2010   lumpectomy   CARDIOVERSION N/A 07/07/2018   Procedure: CARDIOVERSION;  Surgeon: Maudine Sos, MD;  Location: [facility] ENDOSCOPY;  Service: Cardiovascular;  Laterality: N/A;   CARDIOVERSION N/A 07/14/2018   Procedure: CARDIOVERSION;  Surgeon: Darlis Eisenmenger, MD;  Location: [facility] ENDOSCOPY;  Service: Cardiovascular;  Laterality: N/A;   CESAREAN SECTION     COLONOSCOPY WITH PROPOFOL  N/A 07/06/2018   Procedure: COLONOSCOPY WITH PROPOFOL ;  Surgeon: Ozell Blunt, MD;  Location: [facility] ENDOSCOPY;  Service: Endoscopy;  Laterality: N/A;   CYSTECTOMY  1970   pilonidal    DILATION AND CURETTAGE OF UTERUS     ESOPHAGOGASTRODUODENOSCOPY (EGD) WITH PROPOFOL  N/A 07/06/2018   Procedure: ESOPHAGOGASTRODUODENOSCOPY (EGD) WITH PROPOFOL ;  Surgeon: Ozell Blunt, MD;  Location: [facility] ENDOSCOPY;  Service: Endoscopy;  Laterality: N/A;   EYE SURGERY Bilateral 07/2013   cataracts   HOT HEMOSTASIS N/A 07/06/2018   Procedure: HOT HEMOSTASIS (ARGON PLASMA COAGULATION/BICAP);  Surgeon: Ozell Blunt, MD;  Location: [facility] ENDOSCOPY;  Service: Endoscopy;  Laterality: N/A;   implantable loop recorder placement  01/15/2022   Medtronic Reveal Linq model [id number] implantable loop recorder (SN [id number]) implanted for AF management by Dr Nunzio Belch   MAXIMUM ACCESS (MAS)POSTERIOR LUMBAR INTERBODY FUSION (PLIF) 2 LEVEL N/A 09/22/2013   Procedure: FOR MAXIMUM ACCESS  POSTERIOR LUMBAR INTERBODY FUSION LUMBAR THREE-FOUR FOUR-FIVE;  Surgeon: Isadora Mar, MD;  Location: [facility] NEURO ORS;  Service: Neurosurgery;  Laterality: N/A;   OOPHORECTOMY     wedge section not rem   REPLACEMENT TOTAL KNEE Left 2011   REPLACEMENT TOTAL KNEE     RIGHT/LEFT HEART CATH AND CORONARY ANGIOGRAPHY N/A 10/26/2020   Procedure:  RIGHT/LEFT HEART CATH AND CORONARY ANGIOGRAPHY;  Surgeon: Arleen Lacer, MD;  Location: [facility] INVASIVE CV LAB;  Service: Cardiovascular;  Laterality: N/A;   TEE WITHOUT CARDIOVERSION N/A 07/07/2018   Procedure: TRANSESOPHAGEAL ECHOCARDIOGRAM (TEE);  Surgeon: Maudine Sos, MD;  Location: [facility] ENDOSCOPY;  Service: Cardiovascular;  Laterality: N/A;   TEE WITHOUT CARDIOVERSION N/A 09/21/2018   Procedure: TRANSESOPHAGEAL ECHOCARDIOGRAM (TEE);  Surgeon: Alroy Aspen Lela Purple, MD;  Location: [facility] ENDOSCOPY;  Service: Cardiovascular;  Laterality: N/A;   TONSILLECTOMY     TUBAL LIGATION     Patient Active Problem List   Diagnosis Date Noted   Hyperlipidemia 09/25/2023   Shortness of breath    Unstable angina (HCC) 10/23/2020   Hypercoagulable state due to paroxysmal atrial fibrillation (HCC) 10/15/2020   Encounter for therapeutic drug monitoring 08/17/2018   CKD (chronic kidney disease) stage 3, GFR 30-59 ml/min (HCC) 07/21/2018   Chronic anemia 07/21/2018   Anemia    Heme positive stool    Atrial fibrillation with RVR (HCC) 07/04/2018   Paroxysmal atrial fibrillation (HCC) 07/02/2018   Carotid stenosis    Bruit of left carotid artery 09/22/2017   Aortic stenosis    Heart murmur 09/09/2016   Morbid obesity (HCC) 02/05/2016   Dry eye 05/17/2015   Type 2 diabetes mellitus without complication (HCC) 08/10/2014   Breast cancer, left breast (HCC) 07/22/2014   Obesity, Class III, BMI 40-49.9 (morbid obesity) (HCC) 07/06/2014   Macular pseudohole 05/17/2014   History of surgical procedure 05/17/2014   OSA (obstructive sleep apnea) 11/17/2013   S/P lumbar spinal fusion 09/22/2013   Persistent atrial fibrillation (HCC) 09/11/2011    Class: Acute   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HCC) 09/11/2011    Class: Chronic   Essential hypertension 09/11/2011   Exogenous obesity 09/11/2011   Gastroesophageal reflux disease 09/11/2011   Thalassemia minor 09/11/2011    Class: Chronic   Breast cancer (HCC) 09/11/2011     Class: Chronic   Degenerative joint disease 09/11/2011    Class: Chronic   Irritable bowel disease 09/11/2011    Class: Chronic   Lumbar disc disease 09/11/2011    PCP: Candi Chafe, MD  REFERRING PROVIDER: Jann Melody, DO  REFERRING DIAG: chronic bag and knee pain  Rationale for Evaluation and Treatment: Rehabilitation  THERAPY DIAG:  Other low back pain  Muscle spasm of back  Acute pain of right knee  Unsteadiness on feet  Difficulty in walking, not elsewhere classified  ONSET DATE: 10/14/22  SUBJECTIVE:  SUBJECTIVE STATEMENT:  My knees are hurting today, sore, my back is very tender Patient reports that she feels that she is unsteady on her feet and walking.  She did have a fall this AM.  She reports that her feet got caught in the bed spread and she fell, she reports that had pain in the knees and the back before but now having an increase of LBP.  She has a history of low back surgery fusion L3-5  PERTINENT HISTORY:  See above  PAIN:  Are you having pain? 5/10 in the low back, worse with standing , walking and activity, sit and rest pain can be a 5/10 but it is always there, ache, feels weak int eh core, reports knees are sore today after she had a fall  PRECAUTIONS: Other: lumbar fusion 2015  WEIGHT BEARING RESTRICTIONS: No  FALLS:  Has patient fallen in last 6 months? Yes, one fall this AM  LIVING ENVIRONMENT: Lives with: lives with their family Lives in: House/apartment Stairs: Yes: Internal: 15 steps; can reach both Has following equipment at home: has a cane and uses at times  OCCUPATION: retired  PLOF: Independent and gardens, does Presenter, broadcasting, cooks and Lexicographer, shops  PATIENT GOALS: strengthen core and have less pain  NEXT MD VISIT:   OBJECTIVE:   DIAGNOSTIC FINDINGS:   No recent x-rays   PATIENT SURVEYS:  ODI = 54% disability  COGNITION: Overall cognitive status: Within functional limits for tasks assessed     SENSATION: WFL  MUSCLE LENGTH: Very tight HS, piriformis and quads  POSTURE: rounded shoulders and forward head  PALPATION: Tender and tight in the low back mms, tightness iin the rhomboids and the upper traps, very sore in the right low back and into the buttock   LUMBAR ROM:  all motions increased right low back pain  AROM eval  Flexion Decreased 50%  Extension Decreased 100%  Right lateral flexion Decreased 75%  Left lateral flexion Decreased 75%  Right rotation   Left rotation    (Blank rows = not tested)   Shoulder ROM left : AROM   Flexion: 100, abduction 95, ER 30, IR 40 all increase pain and she is very painful with ER and abduction LOWER EXTREMITY ROM:     Active  Right eval Left eval  Hip flexion    Hip extension    Hip abduction    Hip adduction    Hip internal rotation    Hip external rotation    Knee flexion 10 5  Knee extension 95 100  Ankle dorsiflexion    Ankle plantarflexion    Ankle inversion    Ankle eversion     (Blank rows = not tested)  LOWER EXTREMITY MMT:    MMT Right eval Left eval  Hip flexion 4- 4-  Hip extension 4- 4-  Hip abduction 4- 4-  Hip adduction    Hip internal rotation    Hip external rotation    Knee flexion 3+ 4-  Knee extension 3+ 4-  Ankle dorsiflexion 4 4  Ankle plantarflexion    Ankle inversion    Ankle eversion     (Blank rows = not tested)  FUNCTIONAL TESTS:  ODI= 27/50 54% TUG:  33 seconds  GAIT: Distance walked: 80 feet Assistive device utilized: no device Level of assistance: Complete Independence Comments: slow, right foot turned out, limp on the right  TODAY'S TREATMENT:  DATE:   02/03/24 Nustep level 5 x 10 minutes TUG  15 seconds Seated row 20# Lats 20# 15# leg curls 5# straight arm pulls Black tband lumbar extension while seated Feet on ball K2C, rotation, small bridge, isometric abs Passive stretch LE's STM to the lumbar area with patient sitting  01/27/24 Nustep level 5 x 10 minutes 20# seated row 20# lats Black tband abs and extension 15# leg curls Feet on ball K2C, rotation, small bridge, isometric abs Passive LE stretches  01/20/24 Nustep level 5 x 6 minutes Leg curls 15# 2x10 Seated rows 20# Lats 20# Black tband extension and flexion for core Standing 2.5# hip abduction, marching Feet on ball K2C, rotation, small bridge, isometric abs Passive stretch HS and piriformis Ball b/n knees squeeze Blue tband clamshells  01/13/24 Nustep level 4 x 5 minutes pain Feet on ball K2C, rotation, small bridge, isometric abs Passive LE stretches STM with the Tgun to the buttocks and the lumbar area  01/06/24 Nustep level 6 x 9 minutes 20# rows 20# lats Black tband trunk flexion and extension Feet on ball K2C, rotation, bridge and isometric abs Biceps, triceps and OH press 3# Passive LE stretches Green tband clamshells Gentle sheet traction   12/31/23 Nustep L 6 10 min Gait around the back parking [place], no rest, good pace 3 min with 1  standing rest breaks d/t pain in gluts and general leg pain Modified Dead lifts 5# 8x then 8x without wt 20# rows 2 sets 10 20# lats 2 sets 10 Black tband trunk flex and ext 20x Feet on ball K2C, rotation, small bridges, isometric abs Passive LE stretching    12/22/23 Nustep level 5 x 10 minutes 20# rows 20# lats 20# HS curls Feet on ball K2C, rotation, small bridges, isometric abs Green tband clamshells Ball b/n knees squeeze Passive stretch LE's  12/08/23 Nustep L 5 10 min Gait around the back parking [place], no rest, good pace 5 min but needed 2 standing rest breaks d/t pan in gluts/HS Feet on ball K2C, rotation, small bridge, isometric  abs PROM BIL LE and trunk Black tband 20 x trunk flex and ext Rows 15# 2x10 Lats 15# 2x10 Black bar heel raise and toe raises 20x STS with wt ball press 10 x   12/01/23 Gait around the back parking [place], no rest, good pace Rows 15# 2x10 Lats 15# 2x10 Straight arm pulls 5# cues for core and posture 15# HS curls Nustep level 5 x 9 minutes PAssive LE stretches Feet on ball K2C, rotation, small bridge, isometric abs STM with T-gun to HS and ITB  11/24/23 Gait around the parking [place] 1 rest break SBA for curbs Nustep level 5 x 6 minutes Feet on ball K2C, rotation, small bridge, isometric abs Ball b/n knees squeeze Green tband clamshells in hooklying Passive stretch STM with the Tgun to the ITB and the HS  11/19/23 Nustep level 5 x 7 minutes Standing red tband row and extension Feet on ball K2C, small rotation, small bridge and isometric abs Red tband clamshells Ball b/n knees squeeze LE stretches STM to the buttocks and the low back  11/09/23 Nustep L 5 8 min STS from mat with wt ball press 10 x Ball b/n knees squeeze 20x Green tband clamshells 2 sets 10 Green tband hip flex  2 sets 10 Seated red tband row and extension Black tband trunk flex and ext 2 sets 10 LAQ 2 sets 10 Green tband HS curl 2 sets 10  PATIENT  EDUCATION:  Education details: POC Person educated: Patient Education method: Explanation Education comprehension: verbalized understanding  HOME EXERCISE PROGRAM: 12/01/23 K2C, trunk rotation, HS and piriformis stretches  ASSESSMENT:  CLINICAL IMPRESSION: Patient reports that she is having knee pain and stiffness and reports that her back is very tender.  She tolerates eth exercises well without a lot of pain but does have increased pain in the knees and back  I did add some STM to the low back and she has some very tender knots in the bilateral SI and lumbar areas.  She did report that she was feeling better after the STM today. She wants to continue PT  to see if she can get back to walking for exercise  OBJECTIVE IMPAIRMENTS: Abnormal gait, cardiopulmonary status limiting activity, decreased activity tolerance, decreased balance, decreased coordination, decreased endurance, decreased mobility, difficulty walking, decreased ROM, decreased strength, increased fascial restrictions, impaired perceived functional ability, increased muscle spasms, impaired flexibility, improper body mechanics, and pain.   REHAB POTENTIAL: Good  CLINICAL DECISION MAKING: Stable/uncomplicated  EVALUATION COMPLEXITY: Low   GOALS: Goals reviewed with patient? Yes  SHORT TERM GOALS: Target date: 10/24/23  Independent with initial HEP Goal status: met 12/01/23  LONG TERM GOALS: Target date: 12/28/23  Independent wiht posture and body mechanics Goal status: MET 11/09/23  2.  Decrease TUG time to 15 seconds Goal status: MET 11/09/23  3.  Decrease pain 25% Goal status: ongoing 12/01/23  progressing 12/08/23  . 01/27/24 good and bad days overall 50% better ( back and knees pain - not sure what causes pain)  4.  Be able to cook a meal without pain > 5/10 Goal status: progressing 12/01/23 and 12/08/23. 12/31/23 MET  5.  Independent with HEP or gym Goal status: progressing 12/22/23  01/13/24 evolving progressing 02/03/24  PLAN:  PT FREQUENCY: 1-2x/week  PT DURATION: 12 weeks  PLANNED INTERVENTIONS: Therapeutic exercises, Therapeutic activity, Neuromuscular re-education, Balance training, Gait training, Patient/Family education, Self Care, Joint mobilization, Dry Needling, Electrical stimulation, Cryotherapy, Moist heat, and Manual therapy.  PLAN FOR NEXT SESSION: continue to progress as tolerated today is visit 5 of 10 newly authorized, hurting more today, see if she is feeling better, does she need to see back MD, possible more STM to the low back area   [place], PT 02/03/2024, 9:32 AM [facility]  [facility]. [place], [place], [address] Phone: [phone]   Fax:  [phone]

## 2024-02-04 ENCOUNTER — Ambulatory Visit

## 2024-02-04 DIAGNOSIS — I5032 Chronic diastolic (congestive) heart failure: Secondary | ICD-10-CM | POA: Diagnosis not present

## 2024-02-04 LAB — CUP PACEART REMOTE DEVICE CHECK
Date Time Interrogation Session: 20250611232113
Implantable Pulse Generator Implant Date: 20230524

## 2024-02-06 ENCOUNTER — Ambulatory Visit: Payer: Self-pay | Admitting: Cardiovascular Disease

## 2024-02-10 ENCOUNTER — Encounter: Payer: Self-pay | Admitting: Physical Therapy

## 2024-02-10 ENCOUNTER — Ambulatory Visit: Admitting: Physical Therapy

## 2024-02-10 DIAGNOSIS — M62830 Muscle spasm of back: Secondary | ICD-10-CM | POA: Diagnosis not present

## 2024-02-10 DIAGNOSIS — M25561 Pain in right knee: Secondary | ICD-10-CM | POA: Diagnosis not present

## 2024-02-10 DIAGNOSIS — R2681 Unsteadiness on feet: Secondary | ICD-10-CM

## 2024-02-10 DIAGNOSIS — R262 Difficulty in walking, not elsewhere classified: Secondary | ICD-10-CM

## 2024-02-10 DIAGNOSIS — M5459 Other low back pain: Secondary | ICD-10-CM

## 2024-02-10 NOTE — Therapy (Signed)
 OUTPATIENT PHYSICAL THERAPY THORACOLUMBAR     Patient Name: Jenna Mcdonald MRN: [id number] DOB:06-11-1946, 78 y.o., female Today's Date: 02/10/2024  END OF SESSION:  PT End of Session - 02/10/24 1057     Visit Number 16    Number of Visits 20    Date for PT Re-Evaluation 03/07/24    Authorization Type [employer] 6/10    PT Start Time 1053    PT Stop Time 1140    PT Time Calculation (min) 47 min    Activity Tolerance Patient tolerated treatment well    Behavior During Therapy [facility] for tasks assessed/performed           Past Medical History:  Diagnosis Date   Anemia    Aortic stenosis    mild by echo 09/2020 and 08/2022 with mean AVG   Breast CA (HCC)    left   Breast cancer (HCC)    Carotid stenosis    1-39% left   Degenerative disc disease, lumbar    of the knees   Diabetes mellitus    Dysrhythmia    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HCC)    Encounter for therapeutic drug monitoring 08/17/2018   Fatigue    Severe secondary to to gemfibrozil   Gastroesophageal reflux disease    denies   GI bleeding    a. 06/2018: EGD showed multiple nonbleeding duodenal ulcers. 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   Hammertoe    left second toe   Hypercholesteremia    Hypertension    Insomnia    Irritable bowel    Low back pain    Metabolic syndrome    Morbid obesity (HCC)    OSA on CPAP \   bipap   Osteoarthritis    Osteopenia    Persistent atrial fibrillation [facility])    s/p afib ablation - Dr. Nunzio Belch 08/2018   Personal history of radiation therapy    Pneumonia    PONV (postoperative nausea and vomiting)    Sigmoid diverticulosis    Thalassemia minor    Unstable angina (HCC) 10/23/2020   Vitamin D  deficiency    Past Surgical History:  Procedure Laterality Date   ATRIAL FIBRILLATION ABLATION N/A 09/21/2018   Procedure: ATRIAL FIBRILLATION ABLATION;  Surgeon: Jolly Needle, MD;  Location: [facility] INVASIVE  CV LAB;  Service: Cardiovascular;  Laterality: N/A;   BIOPSY  07/06/2018   Procedure: BIOPSY;  Surgeon: Ozell Blunt, MD;  Location[facility] ENDOSCOPY;  Service: Endoscopy;;   BREAST LUMPECTOMY Left 05/28/2009   BREAST SURGERY Left 2010   lumpectomy   CARDIOVERSION N/A 07/07/2018   Procedure: CARDIOVERSION;  Surgeon: Maudine Sos, MD;  Location: [facility] ENDOSCOPY;  Service: Cardiovascular;  Laterality: N/A;   CARDIOVERSION N/A 07/14/2018   Procedure: CARDIOVERSION;  Surgeon: Darlis Eisenmenger, MD;  Location: [facility] ENDOSCOPY;  Service: Cardiovascular;  Laterality: N/A;   CESAREAN SECTION     COLONOSCOPY WITH PROPOFOL  N/A 07/06/2018   Procedure: COLONOSCOPY WITH PROPOFOL ;  Surgeon: Ozell Blunt, MD;  Location: [facility] ENDOSCOPY;  Service: Endoscopy;  Laterality: N/A;   CYSTECTOMY  1970   pilonidal    DILATION AND CURETTAGE OF UTERUS     ESOPHAGOGASTRODUODENOSCOPY (EGD) WITH PROPOFOL  N/A 07/06/2018   Procedure: ESOPHAGOGASTRODUODENOSCOPY (EGD) WITH PROPOFOL ;  Surgeon: Ozell Blunt, MD;  Location: [facility] ENDOSCOPY;  Service: Endoscopy;  Laterality: N/A;   EYE SURGERY Bilateral 07/2013   cataracts   HOT HEMOSTASIS N/A 07/06/2018   Procedure: HOT HEMOSTASIS (ARGON PLASMA COAGULATION/BICAP);  Surgeon: Lavaughn Portland,  Sims Duck, MD;  Location: [facility] ENDOSCOPY;  Service: Endoscopy;  Laterality: N/A;   implantable loop recorder placement  01/15/2022   [employer] Reveal Linq model [id number] implantable loop recorder (SN [id number]) implanted for AF management by Dr Nunzio Belch   MAXIMUM ACCESS (MAS)POSTERIOR LUMBAR INTERBODY FUSION (PLIF) 2 LEVEL N/A 09/22/2013   Procedure: FOR MAXIMUM ACCESS  POSTERIOR LUMBAR INTERBODY FUSION LUMBAR THREE-FOUR FOUR-FIVE;  Surgeon: Isadora Mar, MD;  Location: [facility] NEURO ORS;  Service: Neurosurgery;  Laterality: N/A;   OOPHORECTOMY     wedge section not rem   REPLACEMENT TOTAL KNEE Left 2011   REPLACEMENT TOTAL KNEE     RIGHT/LEFT HEART CATH AND CORONARY ANGIOGRAPHY N/A 10/26/2020   Procedure: RIGHT/LEFT HEART  CATH AND CORONARY ANGIOGRAPHY;  Surgeon: Arleen Lacer, MD;  Location: [facility] INVASIVE CV LAB;  Service: Cardiovascular;  Laterality: N/A;   TEE WITHOUT CARDIOVERSION N/A 07/07/2018   Procedure: TRANSESOPHAGEAL ECHOCARDIOGRAM (TEE);  Surgeon: Maudine Sos, MD;  Location: [facility] ENDOSCOPY;  Service: Cardiovascular;  Laterality: N/A;   TEE WITHOUT CARDIOVERSION N/A 09/21/2018   Procedure: TRANSESOPHAGEAL ECHOCARDIOGRAM (TEE);  Surgeon: Alroy Aspen Lela Purple, MD;  Location: [facility] ENDOSCOPY;  Service: Cardiovascular;  Laterality: N/A;   TONSILLECTOMY     TUBAL LIGATION     Patient Active Problem List   Diagnosis Date Noted   Hyperlipidemia 09/25/2023   Shortness of breath    Unstable angina (HCC) 10/23/2020   Hypercoagulable state due to paroxysmal atrial fibrillation (HCC) 10/15/2020   Encounter for therapeutic drug monitoring 08/17/2018   CKD (chronic kidney disease) stage 3, GFR 30-59 ml/min (HCC) 07/21/2018   Chronic anemia 07/21/2018   Anemia    Heme positive stool    Atrial fibrillation with RVR (HCC) 07/04/2018   Paroxysmal atrial fibrillation (HCC) 07/02/2018   Carotid stenosis    Bruit of left carotid artery 09/22/2017   Aortic stenosis    Heart murmur 09/09/2016   Morbid obesity (HCC) 02/05/2016   Dry eye 05/17/2015   Type 2 diabetes mellitus without complication (HCC) 08/10/2014   Breast cancer, left breast (HCC) 07/22/2014   Obesity, Class III, BMI 40-49.9 (morbid obesity) (HCC) 07/06/2014   Macular pseudohole 05/17/2014   History of surgical procedure 05/17/2014   OSA (obstructive sleep apnea) 11/17/2013   S/P lumbar spinal fusion 09/22/2013   Persistent atrial fibrillation (HCC) 09/11/2011    Class: Acute   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HCC) 09/11/2011    Class: Chronic   Essential hypertension 09/11/2011   Exogenous obesity 09/11/2011   Gastroesophageal reflux disease 09/11/2011   Thalassemia minor 09/11/2011    Class: Chronic   Breast cancer (HCC) 09/11/2011    Class: Chronic    Degenerative joint disease 09/11/2011    Class: Chronic   Irritable bowel disease 09/11/2011    Class: Chronic   Lumbar disc disease 09/11/2011    PCP: Candi Chafe, MD  REFERRING PROVIDER: Jann Melody, DO  REFERRING DIAG: chronic bag and knee pain  Rationale for Evaluation and Treatment: Rehabilitation  THERAPY DIAG:  Other low back pain  Muscle spasm of back  Acute pain of right knee  Unsteadiness on feet  Difficulty in walking, not elsewhere classified  ONSET DATE: 10/14/22  SUBJECTIVE:  SUBJECTIVE STATEMENT: I am feeling better today. Less pain but still stiff and tight in the low back, thought the STM helped Patient reports that she feels that she is unsteady on her feet and walking.  She did have a fall this AM.  She reports that her feet got caught in the bed spread and she fell, she reports that had pain in the knees and the back before but now having an increase of LBP.  She has a history of low back surgery fusion L3-5  PERTINENT HISTORY:  See above  PAIN:  Are you having pain? 5/10 in the low back, worse with standing , walking and activity, sit and rest pain can be a 5/10 but it is always there, ache, feels weak int eh core, reports knees are sore today after she had a fall  PRECAUTIONS: Other: lumbar fusion 2015  WEIGHT BEARING RESTRICTIONS: No  FALLS:  Has patient fallen in last 6 months? Yes, one fall this AM  LIVING ENVIRONMENT: Lives with: lives with their family Lives in: House/apartment Stairs: Yes: Internal: 15 steps; can reach both Has following equipment at home: has a cane and uses at times  OCCUPATION: retired  PLOF: Independent and gardens, does Presenter, broadcasting, cooks and Lexicographer, shops  PATIENT GOALS: strengthen core and have less pain  NEXT MD VISIT:   OBJECTIVE:    DIAGNOSTIC FINDINGS:  No recent x-rays   PATIENT SURVEYS:  ODI = 54% disability  COGNITION: Overall cognitive status: Within functional limits for tasks assessed     SENSATION: WFL  MUSCLE LENGTH: Very tight HS, piriformis and quads  POSTURE: rounded shoulders and forward head  PALPATION: Tender and tight in the low back mms, tightness iin the rhomboids and the upper traps, very sore in the right low back and into the buttock   LUMBAR ROM:  all motions increased right low back pain  AROM eval  Flexion Decreased 50%  Extension Decreased 100%  Right lateral flexion Decreased 75%  Left lateral flexion Decreased 75%  Right rotation   Left rotation    (Blank rows = not tested)   Shoulder ROM left : AROM   Flexion: 100, abduction 95, ER 30, IR 40 all increase pain and she is very painful with ER and abduction LOWER EXTREMITY ROM:     Active  Right eval Left eval  Hip flexion    Hip extension    Hip abduction    Hip adduction    Hip internal rotation    Hip external rotation    Knee flexion 10 5  Knee extension 95 100  Ankle dorsiflexion    Ankle plantarflexion    Ankle inversion    Ankle eversion     (Blank rows = not tested)  LOWER EXTREMITY MMT:    MMT Right eval Left eval  Hip flexion 4- 4-  Hip extension 4- 4-  Hip abduction 4- 4-  Hip adduction    Hip internal rotation    Hip external rotation    Knee flexion 3+ 4-  Knee extension 3+ 4-  Ankle dorsiflexion 4 4  Ankle plantarflexion    Ankle inversion    Ankle eversion     (Blank rows = not tested)  FUNCTIONAL TESTS:  ODI= 27/50 54% TUG:  33 seconds  GAIT: Distance walked: 80 feet Assistive device utilized: no device Level of assistance: Complete Independence Comments: slow, right foot turned out, limp on the right  TODAY'S TREATMENT:  DATE:   02/10/24 Nustep  level 5 x 10 minutes Leg curls 15# 5# straight arm pulls 20# lats 20# rows Black tband trunk flexion and extension Looked at her arm for her as she is having pain in the left arm/chest and feels like it is due to the loop recorder that was placed last year, she is tender in the pectoral area, asked her to call the  Feet on ball K2C, rotation, bridge, isometric abs Passive stretch STM to the upper traps and the lumbar area  02/03/24 Nustep level 5 x 10 minutes TUG 15 seconds Seated row 20# Lats 20# 15# leg curls 5# straight arm pulls Black tband lumbar extension while seated Feet on ball K2C, rotation, small bridge, isometric abs Passive stretch LE's STM to the lumbar area with patient sitting  01/27/24 Nustep level 5 x 10 minutes 20# seated row 20# lats Black tband abs and extension 15# leg curls Feet on ball K2C, rotation, small bridge, isometric abs Passive LE stretches  01/20/24 Nustep level 5 x 6 minutes Leg curls 15# 2x10 Seated rows 20# Lats 20# Black tband extension and flexion for core Standing 2.5# hip abduction, marching Feet on ball K2C, rotation, small bridge, isometric abs Passive stretch HS and piriformis Ball b/n knees squeeze Blue tband clamshells  01/13/24 Nustep level 4 x 5 minutes pain Feet on ball K2C, rotation, small bridge, isometric abs Passive LE stretches STM with the Tgun to the buttocks and the lumbar area  01/06/24 Nustep level 6 x 9 minutes 20# rows 20# lats Black tband trunk flexion and extension Feet on ball K2C, rotation, bridge and isometric abs Biceps, triceps and OH press 3# Passive LE stretches Green tband clamshells Gentle sheet traction   12/31/23 Nustep L 6 10 min Gait around the back parking [place], no rest, good pace 3 min with 1  standing rest breaks d/t pain in gluts and general leg pain Modified Dead lifts 5# 8x then 8x without wt 20# rows 2 sets 10 20# lats 2 sets 10 Black tband trunk flex and ext 20x Feet on  ball K2C, rotation, small bridges, isometric abs Passive LE stretching    12/22/23 Nustep level 5 x 10 minutes 20# rows 20# lats 20# HS curls Feet on ball K2C, rotation, small bridges, isometric abs Green tband clamshells Ball b/n knees squeeze Passive stretch LE's  12/08/23 Nustep L 5 10 min Gait around the back parking [place], no rest, good pace 5 min but needed 2 standing rest breaks d/t pan in gluts/HS Feet on ball K2C, rotation, small bridge, isometric abs PROM BIL LE and trunk Black tband 20 x trunk flex and ext Rows 15# 2x10 Lats 15# 2x10 Black bar heel raise and toe raises 20x STS with wt ball press 10 x   12/01/23 Gait around the back parking [place], no rest, good pace Rows 15# 2x10 Lats 15# 2x10 Straight arm pulls 5# cues for core and posture 15# HS curls Nustep level 5 x 9 minutes PAssive LE stretches Feet on ball K2C, rotation, small bridge, isometric abs STM with T-gun to HS and ITB  11/24/23 Gait around the parking [place] 1 rest break SBA for curbs Nustep level 5 x 6 minutes Feet on ball K2C, rotation, small bridge, isometric abs Ball b/n knees squeeze Green tband clamshells in hooklying Passive stretch STM with the Tgun to the ITB and the HS  PATIENT EDUCATION:  Education details: POC Person educated: Patient Education method: Explanation Education comprehension: verbalized understanding  HOME EXERCISE PROGRAM: 12/01/23 K2C, trunk rotation, HS and piriformis stretches  ASSESSMENT:  CLINICAL IMPRESSION: Patient felt like the STM really helped her low back, she dies c/o some pain in  the left anterior shoulder chest and feel that it is the loop recorder that was placed last year, she is tender here and has some tightness,  continued with STM to the low back and she has some very tender knots in the bilateral SI and lumbar areas.  . She wants to continue PT to see if she can get back to walking for exercise  OBJECTIVE IMPAIRMENTS: Abnormal gait,  cardiopulmonary status limiting activity, decreased activity tolerance, decreased balance, decreased coordination, decreased endurance, decreased mobility, difficulty walking, decreased ROM, decreased strength, increased fascial restrictions, impaired perceived functional ability, increased muscle spasms, impaired flexibility, improper body mechanics, and pain.   REHAB POTENTIAL: Good  CLINICAL DECISION MAKING: Stable/uncomplicated  EVALUATION COMPLEXITY: Low   GOALS: Goals reviewed with patient? Yes  SHORT TERM GOALS: Target date: 10/24/23  Independent with initial HEP Goal status: met 12/01/23  LONG TERM GOALS: Target date: 12/28/23  Independent wiht posture and body mechanics Goal status: MET 11/09/23  2.  Decrease TUG time to 15 seconds Goal status: MET 11/09/23  3.  Decrease pain 25% Goal status: ongoing 12/01/23  progressing 12/08/23  . 01/27/24 good and bad days overall 50% better ( back and knees pain - not sure what causes pain)progressing 02/10/24  4.  Be able to cook a meal without pain > 5/10 Goal status: progressing 12/01/23 and 12/08/23. 12/31/23 MET  5.  Independent with HEP or gym Goal status: progressing 12/22/23  01/13/24 evolving progressing 02/10/24  PLAN:  PT FREQUENCY: 1-2x/week  PT DURATION: 12 weeks  PLANNED INTERVENTIONS: Therapeutic exercises, Therapeutic activity, Neuromuscular re-education, Balance training, Gait training, Patient/Family education, Self Care, Joint mobilization, Dry Needling, Electrical stimulation, Cryotherapy, Moist heat, and Manual therapy.  PLAN FOR NEXT SESSION: continue to progress as tolerated today is visit 6 of 10 newly authorized,progress as tolerated   Hollis Lurie, PT 02/10/2024, 10:58 AM [place] [facility] [address] [facility] [place], [place], [address] Phone: [phone]   Fax:  [phone]

## 2024-02-17 ENCOUNTER — Ambulatory Visit: Admitting: Physical Therapy

## 2024-02-17 ENCOUNTER — Encounter: Payer: Self-pay | Admitting: Physical Therapy

## 2024-02-17 DIAGNOSIS — R262 Difficulty in walking, not elsewhere classified: Secondary | ICD-10-CM

## 2024-02-17 DIAGNOSIS — M5459 Other low back pain: Secondary | ICD-10-CM

## 2024-02-17 DIAGNOSIS — R2681 Unsteadiness on feet: Secondary | ICD-10-CM | POA: Diagnosis not present

## 2024-02-17 DIAGNOSIS — M62830 Muscle spasm of back: Secondary | ICD-10-CM

## 2024-02-17 DIAGNOSIS — M25561 Pain in right knee: Secondary | ICD-10-CM

## 2024-02-17 NOTE — Therapy (Signed)
 OUTPATIENT PHYSICAL THERAPY THORACOLUMBAR     Patient Name: Jenna Mcdonald MRN: [id number] DOB:[birth year], 78 y.o., female Today's Date: 02/17/2024  END OF SESSION:  PT End of Session - 02/17/24 0926     Visit Number 17    Number of Visits 20    Date for PT Re-Evaluation 03/07/24    Authorization Type [employer] 7/10    PT Start Time 0925    PT Stop Time 1012    PT Time Calculation (min) 47 min    Activity Tolerance Patient tolerated treatment well    Behavior During Therapy [facility] for tasks assessed/performed           Past Medical History:  Diagnosis Date   Anemia    Aortic stenosis    mild by echo 09/2020 and 08/2022 with mean AVG   Breast CA (HCC)    left   Breast cancer (HCC)    Carotid stenosis    1-39% left   Degenerative disc disease, lumbar    of the knees   Diabetes mellitus    Dysrhythmia    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HCC)    Encounter for therapeutic drug monitoring 08/17/2018   Fatigue    Severe secondary to to gemfibrozil   Gastroesophageal reflux disease    denies   GI bleeding    a. 06/2018: EGD showed multiple nonbleeding duodenal ulcers. 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   Hammertoe    left second toe   Hypercholesteremia    Hypertension    Insomnia    Irritable bowel    Low back pain    Metabolic syndrome    Morbid obesity (HCC)    OSA on CPAP \   bipap   Osteoarthritis    Osteopenia    Persistent atrial fibrillation [facility])    s/p afib ablation - Dr. Kelsie 08/2018   Personal history of radiation therapy    Pneumonia    PONV (postoperative nausea and vomiting)    Sigmoid diverticulosis    Thalassemia minor    Unstable angina (HCC) 10/23/2020   Vitamin D  deficiency    Past Surgical History:  Procedure Laterality Date   ATRIAL FIBRILLATION ABLATION N/A 09/21/2018   Procedure: ATRIAL FIBRILLATION ABLATION;  Surgeon: Kelsie Agent, MD;  Location: [facility] INVASIVE  CV LAB;  Service: Cardiovascular;  Laterality: N/A;   BIOPSY  07/06/2018   Procedure: BIOPSY;  Surgeon: Rosalie Kitchens, MD;  Location: [facility] ENDOSCOPY;  Service: Endoscopy;;   BREAST LUMPECTOMY Left 05/28/2009   BREAST SURGERY Left 2010   lumpectomy   CARDIOVERSION N/A 07/07/2018   Procedure: CARDIOVERSION;  Surgeon: Raford Riggs, MD;  Location: [facility] ENDOSCOPY;  Service: Cardiovascular;  Laterality: N/A;   CARDIOVERSION N/A 07/14/2018   Procedure: CARDIOVERSION;  Surgeon: Rolan Ezra RAMAN, MD;  Location: [facility] ENDOSCOPY;  Service: Cardiovascular;  Laterality: N/A;   CESAREAN SECTION     COLONOSCOPY WITH PROPOFOL  N/A 07/06/2018   Procedure: COLONOSCOPY WITH PROPOFOL ;  Surgeon: Rosalie Kitchens, MD;  Location: [facility] ENDOSCOPY;  Service: Endoscopy;  Laterality: N/A;   CYSTECTOMY  1970   pilonidal    DILATION AND CURETTAGE OF UTERUS     ESOPHAGOGASTRODUODENOSCOPY (EGD) WITH PROPOFOL  N/A 07/06/2018   Procedure: ESOPHAGOGASTRODUODENOSCOPY (EGD) WITH PROPOFOL ;  Surgeon: Rosalie Kitchens, MD;  Location: [facility] ENDOSCOPY;  Service: Endoscopy;  Laterality: N/A;   EYE SURGERY Bilateral 07/2013   cataracts   HOT HEMOSTASIS N/A 07/06/2018   Procedure: HOT HEMOSTASIS (ARGON PLASMA COAGULATION/BICAP);  Surgeon: Magod,  Oliva, MD;  Location: [facility] ENDOSCOPY;  Service: Endoscopy;  Laterality: N/A;   implantable loop recorder placement  01/15/2022   [employer] Reveal Linq model [id number] implantable loop recorder (SN [id number]) implanted for AF management by Dr Kelsie   MAXIMUM ACCESS (MAS)POSTERIOR LUMBAR INTERBODY FUSION (PLIF) 2 LEVEL N/A 09/22/2013   Procedure: FOR MAXIMUM ACCESS  POSTERIOR LUMBAR INTERBODY FUSION LUMBAR THREE-FOUR FOUR-FIVE;  Surgeon: Alm GORMAN Molt, MD;  Location: [facility] NEURO ORS;  Service: Neurosurgery;  Laterality: N/A;   OOPHORECTOMY     wedge section not rem   REPLACEMENT TOTAL KNEE Left 2011   REPLACEMENT TOTAL KNEE     RIGHT/LEFT HEART CATH AND CORONARY ANGIOGRAPHY N/A 10/26/2020   Procedure: RIGHT/LEFT HEART  CATH AND CORONARY ANGIOGRAPHY;  Surgeon: Anner Alm ORN, MD;  Location: [facility] INVASIVE CV LAB;  Service: Cardiovascular;  Laterality: N/A;   TEE WITHOUT CARDIOVERSION N/A 07/07/2018   Procedure: TRANSESOPHAGEAL ECHOCARDIOGRAM (TEE);  Surgeon: Raford Riggs, MD;  Location: [facility] ENDOSCOPY;  Service: Cardiovascular;  Laterality: N/A;   TEE WITHOUT CARDIOVERSION N/A 09/21/2018   Procedure: TRANSESOPHAGEAL ECHOCARDIOGRAM (TEE);  Surgeon: Alveta Aleene PARAS, MD;  Location: [facility] ENDOSCOPY;  Service: Cardiovascular;  Laterality: N/A;   TONSILLECTOMY     TUBAL LIGATION     Patient Active Problem List   Diagnosis Date Noted   Hyperlipidemia 09/25/2023   Shortness of breath    Unstable angina (HCC) 10/23/2020   Hypercoagulable state due to paroxysmal atrial fibrillation (HCC) 10/15/2020   Encounter for therapeutic drug monitoring 08/17/2018   CKD (chronic kidney disease) stage 3, GFR 30-59 ml/min (HCC) 07/21/2018   Chronic anemia 07/21/2018   Anemia    Heme positive stool    Atrial fibrillation with RVR (HCC) 07/04/2018   Paroxysmal atrial fibrillation (HCC) 07/02/2018   Carotid stenosis    Bruit of left carotid artery 09/22/2017   Aortic stenosis    Heart murmur 09/09/2016   Morbid obesity (HCC) 02/05/2016   Dry eye 05/17/2015   Type 2 diabetes mellitus without complication (HCC) 08/10/2014   Breast cancer, left breast (HCC) 07/22/2014   Obesity, Class III, BMI 40-49.9 (morbid obesity) (HCC) 07/06/2014   Macular pseudohole 05/17/2014   History of surgical procedure 05/17/2014   OSA (obstructive sleep apnea) 11/17/2013   S/P lumbar spinal fusion 09/22/2013   Persistent atrial fibrillation (HCC) 09/11/2011    Class: Acute   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HCC) 09/11/2011    Class: Chronic   Essential hypertension 09/11/2011   Exogenous obesity 09/11/2011   Gastroesophageal reflux disease 09/11/2011   Thalassemia minor 09/11/2011    Class: Chronic   Breast cancer (HCC) 09/11/2011    Class: Chronic    Degenerative joint disease 09/11/2011    Class: Chronic   Irritable bowel disease 09/11/2011    Class: Chronic   Lumbar disc disease 09/11/2011    PCP: Dwight, MD  REFERRING PROVIDER: CHARLENA Shutter, DO  REFERRING DIAG: chronic bag and knee pain  Rationale for Evaluation and Treatment: Rehabilitation  THERAPY DIAG:  Other low back pain  Muscle spasm of back  Acute pain of right knee  Unsteadiness on feet  Difficulty in walking, not elsewhere classified  ONSET DATE: 10/14/22  SUBJECTIVE:  SUBJECTIVE STATEMENT: Doing okay, a little tired, I think I am feeling better. Patient reports that she feels that she is unsteady on her feet and walking.  She did have a fall this AM.  She reports that her feet got caught in the bed spread and she fell, she reports that had pain in the knees and the back before but now having an increase of LBP.  She has a history of low back surgery fusion L3-5  PERTINENT HISTORY:  See above  PAIN:  Are you having pain? 5/10 in the low back, worse with standing , walking and activity, sit and rest pain can be a 5/10 but it is always there, ache, feels weak int eh core, reports knees are sore today after she had a fall  PRECAUTIONS: Other: lumbar fusion 2015  WEIGHT BEARING RESTRICTIONS: No  FALLS:  Has patient fallen in last 6 months? Yes, one fall this AM  LIVING ENVIRONMENT: Lives with: lives with their family Lives in: House/apartment Stairs: Yes: Internal: 15 steps; can reach both Has following equipment at home: has a cane and uses at times  OCCUPATION: retired  PLOF: Independent and gardens, does Presenter, broadcasting, cooks and Lexicographer, shops  PATIENT GOALS: strengthen core and have less pain  NEXT MD VISIT:   OBJECTIVE:   DIAGNOSTIC FINDINGS:  No recent x-rays    PATIENT SURVEYS:  ODI = 54% disability  COGNITION: Overall cognitive status: Within functional limits for tasks assessed     SENSATION: WFL  MUSCLE LENGTH: Very tight HS, piriformis and quads  POSTURE: rounded shoulders and forward head  PALPATION: Tender and tight in the low back mms, tightness iin the rhomboids and the upper traps, very sore in the right low back and into the buttock   LUMBAR ROM:  all motions increased right low back pain  AROM eval  Flexion Decreased 50%  Extension Decreased 100%  Right lateral flexion Decreased 75%  Left lateral flexion Decreased 75%  Right rotation   Left rotation    (Blank rows = not tested)   Shoulder ROM left : AROM   Flexion: 100, abduction 95, ER 30, IR 40 all increase pain and she is very painful with ER and abduction LOWER EXTREMITY ROM:     Active  Right eval Left eval  Hip flexion    Hip extension    Hip abduction    Hip adduction    Hip internal rotation    Hip external rotation    Knee flexion 10 5  Knee extension 95 100  Ankle dorsiflexion    Ankle plantarflexion    Ankle inversion    Ankle eversion     (Blank rows = not tested)  LOWER EXTREMITY MMT:    MMT Right eval Left eval  Hip flexion 4- 4-  Hip extension 4- 4-  Hip abduction 4- 4-  Hip adduction    Hip internal rotation    Hip external rotation    Knee flexion 3+ 4-  Knee extension 3+ 4-  Ankle dorsiflexion 4 4  Ankle plantarflexion    Ankle inversion    Ankle eversion     (Blank rows = not tested)  FUNCTIONAL TESTS:  ODI= 27/50 54% TUG:  33 seconds  GAIT: Distance walked: 80 feet Assistive device utilized: no device Level of assistance: Complete Independence Comments: slow, right foot turned out, limp on the right  TODAY'S TREATMENT:  DATE:   02/17/24 Nustep level 5 x 10 minutes LEg curls 20# 2x12 Leg  extension 5# 2x12 Standing on airex 5# straight arm pulls cues for posture and core 15# rows 15# lats Black tband trunk extension and flexion 10# AR press STM to the low back  02/10/24 Nustep level 5 x 10 minutes Leg curls 15# 5# straight arm pulls 20# lats 20# rows Black tband trunk flexion and extension Looked at her arm for her as she is having pain in the left arm/chest and feels like it is due to the loop recorder that was placed last year, she is tender in the pectoral area, asked her to call the  Feet on ball K2C, rotation, bridge, isometric abs Passive stretch STM to the upper traps and the lumbar area  02/03/24 Nustep level 5 x 10 minutes TUG 15 seconds Seated row 20# Lats 20# 15# leg curls 5# straight arm pulls Black tband lumbar extension while seated Feet on ball K2C, rotation, small bridge, isometric abs Passive stretch LE's STM to the lumbar area with patient sitting  01/27/24 Nustep level 5 x 10 minutes 20# seated row 20# lats Black tband abs and extension 15# leg curls Feet on ball K2C, rotation, small bridge, isometric abs Passive LE stretches  01/20/24 Nustep level 5 x 6 minutes Leg curls 15# 2x10 Seated rows 20# Lats 20# Black tband extension and flexion for core Standing 2.5# hip abduction, marching Feet on ball K2C, rotation, small bridge, isometric abs Passive stretch HS and piriformis Ball b/n knees squeeze Blue tband clamshells  01/13/24 Nustep level 4 x 5 minutes pain Feet on ball K2C, rotation, small bridge, isometric abs Passive LE stretches STM with the Tgun to the buttocks and the lumbar area  01/06/24 Nustep level 6 x 9 minutes 20# rows 20# lats Black tband trunk flexion and extension Feet on ball K2C, rotation, bridge and isometric abs Biceps, triceps and OH press 3# Passive LE stretches Green tband clamshells Gentle sheet traction   12/31/23 Nustep L 6 10 min Gait around the back parking [place], no rest, good pace 3 min  with 1  standing rest breaks d/t pain in gluts and general leg pain Modified Dead lifts 5# 8x then 8x without wt 20# rows 2 sets 10 20# lats 2 sets 10 Black tband trunk flex and ext 20x Feet on ball K2C, rotation, small bridges, isometric abs Passive LE stretching    12/22/23 Nustep level 5 x 10 minutes 20# rows 20# lats 20# HS curls Feet on ball K2C, rotation, small bridges, isometric abs Green tband clamshells Ball b/n knees squeeze Passive stretch LE's  12/08/23 Nustep L 5 10 min Gait around the back parking [place], no rest, good pace 5 min but needed 2 standing rest breaks d/t pan in gluts/HS Feet on ball K2C, rotation, small bridge, isometric abs PROM BIL LE and trunk Black tband 20 x trunk flex and ext Rows 15# 2x10 Lats 15# 2x10 Black bar heel raise and toe raises 20x STS with wt ball press 10 x   12/01/23 Gait around the back parking [place], no rest, good pace Rows 15# 2x10 Lats 15# 2x10 Straight arm pulls 5# cues for core and posture 15# HS curls Nustep level 5 x 9 minutes PAssive LE stretches Feet on ball K2C, rotation, small bridge, isometric abs STM with T-gun to HS and ITB  11/24/23 Gait around the parking [place] 1 rest break SBA for curbs Nustep level 5 x 6 minutes Feet on  ball K2C, rotation, small bridge, isometric abs Ball b/n knees squeeze Green tband clamshells in hooklying Passive stretch STM with the Tgun to the ITB and the HS  PATIENT EDUCATION:  Education details: POC Person educated: Patient Education method: Explanation Education comprehension: verbalized understanding  HOME EXERCISE PROGRAM: 12/01/23 K2C, trunk rotation, HS and piriformis stretches  ASSESSMENT:  CLINICAL IMPRESSION: Patient feels like she is doing a little better with the STM, thinks it helps a lot.  She is still very stiff and c/o left shoulder pain and I have asked her to see MD regarding this. She wants to continue PT to see if she can get back to walking for  exercise  I did add AR press and she thought this was good but it caused a little pain  OBJECTIVE IMPAIRMENTS: Abnormal gait, cardiopulmonary status limiting activity, decreased activity tolerance, decreased balance, decreased coordination, decreased endurance, decreased mobility, difficulty walking, decreased ROM, decreased strength, increased fascial restrictions, impaired perceived functional ability, increased muscle spasms, impaired flexibility, improper body mechanics, and pain.   REHAB POTENTIAL: Good  CLINICAL DECISION MAKING: Stable/uncomplicated  EVALUATION COMPLEXITY: Low   GOALS: Goals reviewed with patient? Yes  SHORT TERM GOALS: Target date: 10/24/23  Independent with initial HEP Goal status: met 12/01/23  LONG TERM GOALS: Target date: 12/28/23  Independent wiht posture and body mechanics Goal status: MET 11/09/23  2.  Decrease TUG time to 15 seconds Goal status: MET 11/09/23  3.  Decrease pain 25% Goal status: ongoing 12/01/23  progressing 12/08/23  . 01/27/24 good and bad days overall 50% better ( back and knees pain - not sure what causes pain)progressing 02/17/24  4.  Be able to cook a meal without pain > 5/10 Goal status: progressing 12/01/23 and 12/08/23. 12/31/23 MET  5.  Independent with HEP or gym Goal status: progressing 12/22/23  01/13/24 evolving progressing 02/17/24  PLAN:  PT FREQUENCY: 1-2x/week  PT DURATION: 12 weeks  PLANNED INTERVENTIONS: Therapeutic exercises, Therapeutic activity, Neuromuscular re-education, Balance training, Gait training, Patient/Family education, Self Care, Joint mobilization, Dry Needling, Electrical stimulation, Cryotherapy, Moist heat, and Manual therapy.  PLAN FOR NEXT SESSION: Has a few more visits, feel like we will look to give HEP and have her try on her own, she is responding well to [facility]  OBADIAH OZELL ORN, PT 02/17/2024, 9:26 AM [facility] [place], [place], [address] Phone: [phone]   Fax:  [phone]

## 2024-02-18 DIAGNOSIS — Z79899 Other long term (current) drug therapy: Secondary | ICD-10-CM | POA: Diagnosis not present

## 2024-02-23 NOTE — Addendum Note (Signed)
 Addended by: TAWNI DRILLING D on: 02/23/2024 09:37 AM   Modules accepted: Orders

## 2024-02-23 NOTE — Progress Notes (Signed)
 [facility] Summary Report / Loop Recorder

## 2024-02-24 ENCOUNTER — Other Ambulatory Visit (HOSPITAL_BASED_OUTPATIENT_CLINIC_OR_DEPARTMENT_OTHER): Payer: Self-pay

## 2024-02-24 MED ORDER — MOUNJARO 7.5 MG/0.5ML ~~LOC~~ SOAJ
7.5000 mg | SUBCUTANEOUS | 1 refills | Status: DC
  Filled 2024-02-24 – 2024-03-16 (×2): qty 6, 84d supply, fill #0
  Filled 2024-06-08: qty 6, 84d supply, fill #1

## 2024-02-25 ENCOUNTER — Ambulatory Visit: Attending: Internal Medicine | Admitting: Physical Therapy

## 2024-02-25 DIAGNOSIS — M5459 Other low back pain: Secondary | ICD-10-CM | POA: Insufficient documentation

## 2024-02-25 DIAGNOSIS — M25561 Pain in right knee: Secondary | ICD-10-CM | POA: Diagnosis not present

## 2024-02-25 DIAGNOSIS — R2681 Unsteadiness on feet: Secondary | ICD-10-CM | POA: Insufficient documentation

## 2024-02-25 DIAGNOSIS — M62830 Muscle spasm of back: Secondary | ICD-10-CM | POA: Diagnosis not present

## 2024-02-25 DIAGNOSIS — R262 Difficulty in walking, not elsewhere classified: Secondary | ICD-10-CM | POA: Diagnosis not present

## 2024-02-25 NOTE — Therapy (Signed)
 OUTPATIENT PHYSICAL THERAPY THORACOLUMBAR     Patient Name: Jenna Mcdonald MRN: [id number] DOB:Apr 15, 1946, 78 y.o., female Today's Date: 02/25/2024  END OF SESSION:  PT End of Session - 02/25/24 0837     Visit Number 18    Number of Visits 20    Date for PT Re-Evaluation 03/07/24    PT Start Time 0840    PT Stop Time 0925    PT Time Calculation (min) 45 min           Past Medical History:  Diagnosis Date   Anemia    Aortic stenosis    mild by echo 09/2020 and 08/2022 with mean AVG   Breast CA (HCC)    left   Breast cancer (HCC)    Carotid stenosis    1-39% left   Degenerative disc disease, lumbar    of the knees   Diabetes mellitus    Dysrhythmia    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HCC)    Encounter for therapeutic drug monitoring 08/17/2018   Fatigue    Severe secondary to to gemfibrozil   Gastroesophageal reflux disease    denies   GI bleeding    a. 06/2018: EGD showed multiple nonbleeding duodenal ulcers. 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   Hammertoe    left second toe   Hypercholesteremia    Hypertension    Insomnia    Irritable bowel    Low back pain    Metabolic syndrome    Morbid obesity (HCC)    OSA on CPAP \   bipap   Osteoarthritis    Osteopenia    Persistent atrial fibrillation [facility])    s/p afib ablation - Dr. Kelsie 08/2018   Personal history of radiation therapy    Pneumonia    PONV (postoperative nausea and vomiting)    Sigmoid diverticulosis    Thalassemia minor    Unstable angina (HCC) 10/23/2020   Vitamin D  deficiency    Past Surgical History:  Procedure Laterality Date   ATRIAL FIBRILLATION ABLATION N/A 09/21/2018   Procedure: ATRIAL FIBRILLATION ABLATION;  Surgeon: Kelsie Agent, MD;  Location: [facility] INVASIVE CV LAB;  Service: Cardiovascular;  Laterality: N/A;   BIOPSY  07/06/2018   Procedure: BIOPSY;  Surgeon: Rosalie Kitchens, MD;  Location: [facility] ENDOSCOPY;   Service: Endoscopy;;   BREAST LUMPECTOMY Left 05/28/2009   BREAST SURGERY Left 2010   lumpectomy   CARDIOVERSION N/A 07/07/2018   Procedure: CARDIOVERSION;  Surgeon: Raford Riggs, MD;  Location[facility] ENDOSCOPY;  Service: Cardiovascular;  Laterality: N/A;   CARDIOVERSION N/A 07/14/2018   Procedure: CARDIOVERSION;  Surgeon: Rolan Ezra RAMAN, MD;  Location: [facility] ENDOSCOPY;  Service: Cardiovascular;  Laterality: N/A;   CESAREAN SECTION     COLONOSCOPY WITH PROPOFOL  N/A 07/06/2018   Procedure: COLONOSCOPY WITH PROPOFOL ;  Surgeon: Rosalie Kitchens, MD;  Location: [facility] ENDOSCOPY;  Service: Endoscopy;  Laterality: N/A;   CYSTECTOMY  1970   pilonidal    DILATION AND CURETTAGE OF UTERUS     ESOPHAGOGASTRODUODENOSCOPY (EGD) WITH PROPOFOL  N/A 07/06/2018   Procedure: ESOPHAGOGASTRODUODENOSCOPY (EGD) WITH PROPOFOL ;  Surgeon: Rosalie Kitchens, MD;  Location: [facility] ENDOSCOPY;  Service: Endoscopy;  Laterality: N/A;   EYE SURGERY Bilateral 07/2013   cataracts   HOT HEMOSTASIS N/A 07/06/2018   Procedure: HOT HEMOSTASIS (ARGON PLASMA COAGULATION/BICAP);  Surgeon: Rosalie Kitchens, MD;  Location: [facility] ENDOSCOPY;  Service: Endoscopy;  Laterality: N/A;   implantable loop recorder placement  01/15/2022   [employer] Reveal Linq model  [id number] implantable loop recorder (SN [id number]) implanted for AF management by Dr Kelsie   MAXIMUM ACCESS (MAS)POSTERIOR LUMBAR INTERBODY FUSION (PLIF) 2 LEVEL N/A 09/22/2013   Procedure: FOR MAXIMUM ACCESS  POSTERIOR LUMBAR INTERBODY FUSION LUMBAR THREE-FOUR FOUR-FIVE;  Surgeon: Alm GORMAN Molt, MD;  Location: [facility] NEURO ORS;  Service: Neurosurgery;  Laterality: N/A;   OOPHORECTOMY     wedge section not rem   REPLACEMENT TOTAL KNEE Left 2011   REPLACEMENT TOTAL KNEE     RIGHT/LEFT HEART CATH AND CORONARY ANGIOGRAPHY N/A 10/26/2020   Procedure: RIGHT/LEFT HEART CATH AND CORONARY ANGIOGRAPHY;  Surgeon: Anner Alm ORN, MD;  Location: [facility] INVASIVE CV LAB;  Service: Cardiovascular;  Laterality: N/A;   TEE  WITHOUT CARDIOVERSION N/A 07/07/2018   Procedure: TRANSESOPHAGEAL ECHOCARDIOGRAM (TEE);  Surgeon: Raford Riggs, MD;  Location: [facility] ENDOSCOPY;  Service: Cardiovascular;  Laterality: N/A;   TEE WITHOUT CARDIOVERSION N/A 09/21/2018   Procedure: TRANSESOPHAGEAL ECHOCARDIOGRAM (TEE);  Surgeon: Alveta Aleene PARAS, MD;  Location: [facility] ENDOSCOPY;  Service: Cardiovascular;  Laterality: N/A;   TONSILLECTOMY     TUBAL LIGATION     Patient Active Problem List   Diagnosis Date Noted   Hyperlipidemia 09/25/2023   Shortness of breath    Unstable angina (HCC) 10/23/2020   Hypercoagulable state due to paroxysmal atrial fibrillation (HCC) 10/15/2020   Encounter for therapeutic drug monitoring 08/17/2018   CKD (chronic kidney disease) stage 3, GFR 30-59 ml/min (HCC) 07/21/2018   Chronic anemia 07/21/2018   Anemia    Heme positive stool    Atrial fibrillation with RVR (HCC) 07/04/2018   Paroxysmal atrial fibrillation (HCC) 07/02/2018   Carotid stenosis    Bruit of left carotid artery 09/22/2017   Aortic stenosis    Heart murmur 09/09/2016   Morbid obesity (HCC) 02/05/2016   Dry eye 05/17/2015   Type 2 diabetes mellitus without complication (HCC) 08/10/2014   Breast cancer, left breast (HCC) 07/22/2014   Obesity, Class III, BMI 40-49.9 (morbid obesity) (HCC) 07/06/2014   Macular pseudohole 05/17/2014   History of surgical procedure 05/17/2014   OSA (obstructive sleep apnea) 11/17/2013   S/P lumbar spinal fusion 09/22/2013   Persistent atrial fibrillation (HCC) 09/11/2011    Class: Acute   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HCC) 09/11/2011    Class: Chronic   Essential hypertension 09/11/2011   Exogenous obesity 09/11/2011   Gastroesophageal reflux disease 09/11/2011   Thalassemia minor 09/11/2011    Class: Chronic   Breast cancer (HCC) 09/11/2011    Class: Chronic   Degenerative joint disease 09/11/2011    Class: Chronic   Irritable bowel disease 09/11/2011    Class: Chronic   Lumbar disc disease  09/11/2011    PCP: Dwight, MD  REFERRING PROVIDER: CHARLENA Shutter, DO  REFERRING DIAG: chronic bag and knee pain  Rationale for Evaluation and Treatment: Rehabilitation  THERAPY DIAG:  Other low back pain  Muscle spasm of back  Acute pain of right knee  Unsteadiness on feet  Difficulty in walking, not elsewhere classified  ONSET DATE: 10/14/22  SUBJECTIVE:  SUBJECTIVE STATEMENT: Okay, pain better, but no energy PERTINENT HISTORY:  See above  PAIN:  Are you having pain? 5/10 in the low back, worse with standing , walking and activity, sit and rest pain can be a 5/10 but it is always there, ache, feels weak int eh core, reports knees are sore today after she had a fall  PRECAUTIONS: Other: lumbar fusion 2015  WEIGHT BEARING RESTRICTIONS: No  FALLS:  Has patient fallen in last 6 months? Yes, one fall this AM  LIVING ENVIRONMENT: Lives with: lives with their family Lives in: House/apartment Stairs: Yes: Internal: 15 steps; can reach both Has following equipment at home: has a cane and uses at times  OCCUPATION: retired  PLOF: Independent and gardens, does Presenter, broadcasting, cooks and Lexicographer, shops  PATIENT GOALS: strengthen core and have less pain  NEXT MD VISIT:   OBJECTIVE:   DIAGNOSTIC FINDINGS:  No recent x-rays   PATIENT SURVEYS:  ODI = 54% disability  COGNITION: Overall cognitive status: Within functional limits for tasks assessed     SENSATION: WFL  MUSCLE LENGTH: Very tight HS, piriformis and quads  POSTURE: rounded shoulders and forward head  PALPATION: Tender and tight in the low back mms, tightness iin the rhomboids and the upper traps, very sore in the right low back and into the buttock   LUMBAR ROM:  all motions increased right low back pain  AROM eval  Flexion  Decreased 50%  Extension Decreased 100%  Right lateral flexion Decreased 75%  Left lateral flexion Decreased 75%  Right rotation   Left rotation    (Blank rows = not tested)   Shoulder ROM left : AROM   Flexion: 100, abduction 95, ER 30, IR 40 all increase pain and she is very painful with ER and abduction LOWER EXTREMITY ROM:     Active  Right eval Left eval  Hip flexion    Hip extension    Hip abduction    Hip adduction    Hip internal rotation    Hip external rotation    Knee flexion 10 5  Knee extension 95 100  Ankle dorsiflexion    Ankle plantarflexion    Ankle inversion    Ankle eversion     (Blank rows = not tested)  LOWER EXTREMITY MMT:    MMT Right eval Left eval  Hip flexion 4- 4-  Hip extension 4- 4-  Hip abduction 4- 4-  Hip adduction    Hip internal rotation    Hip external rotation    Knee flexion 3+ 4-  Knee extension 3+ 4-  Ankle dorsiflexion 4 4  Ankle plantarflexion    Ankle inversion    Ankle eversion     (Blank rows = not tested)  FUNCTIONAL TESTS:  ODI= 27/50 54% TUG:  33 seconds  GAIT: Distance walked: 80 feet Assistive device utilized: no device Level of assistance: Complete Independence Comments: slow, right foot turned out, limp on the right  TODAY'S TREATMENT:  DATE:    02/25/24 Nustep level 5 x 10 minutes Amb outside- limited to 3 min with 1 standing rest break- d/t fatigue and pain in back 5# farmer carry 1 lap each hand Black tband trunk flex and ext 2 sets 10 15# rows 2 sets 10 15# lats 2 sets 10 Leg curls 20# 2x12 Leg extension 5# 2x12 Standing on airex 5# straight arm pulls cues for posture and core 10# AR press STS with 3# press 10 x STM to LB  02/17/24 Nustep level 5 x 10 minutes LEg curls 20# 2x12 Leg extension 5# 2x12 Standing on airex 5# straight arm pulls cues for posture and  core 15# rows 15# lats Black tband trunk extension and flexion 10# AR press STM to the low back  02/10/24 Nustep level 5 x 10 minutes Leg curls 15# 5# straight arm pulls 20# lats 20# rows Black tband trunk flexion and extension Looked at her arm for her as she is having pain in the left arm/chest and feels like it is due to the loop recorder that was placed last year, she is tender in the pectoral area, asked her to call the  Feet on ball K2C, rotation, bridge, isometric abs Passive stretch STM to the upper traps and the lumbar area  02/03/24 Nustep level 5 x 10 minutes TUG 15 seconds Seated row 20# Lats 20# 15# leg curls 5# straight arm pulls Black tband lumbar extension while seated Feet on ball K2C, rotation, small bridge, isometric abs Passive stretch LE's STM to the lumbar area with patient sitting  01/27/24 Nustep level 5 x 10 minutes 20# seated row 20# lats Black tband abs and extension 15# leg curls Feet on ball K2C, rotation, small bridge, isometric abs Passive LE stretches  01/20/24 Nustep level 5 x 6 minutes Leg curls 15# 2x10 Seated rows 20# Lats 20# Black tband extension and flexion for core Standing 2.5# hip abduction, marching Feet on ball K2C, rotation, small bridge, isometric abs Passive stretch HS and piriformis Ball b/n knees squeeze Blue tband clamshells  01/13/24 Nustep level 4 x 5 minutes pain Feet on ball K2C, rotation, small bridge, isometric abs Passive LE stretches STM with the Tgun to the buttocks and the lumbar area  01/06/24 Nustep level 6 x 9 minutes 20# rows 20# lats Black tband trunk flexion and extension Feet on ball K2C, rotation, bridge and isometric abs Biceps, triceps and OH press 3# Passive LE stretches Green tband clamshells Gentle sheet traction   12/31/23 Nustep L 6 10 min Gait around the back parking [place], no rest, good pace 3 min with 1  standing rest breaks d/t pain in gluts and general leg pain Modified Dead  lifts 5# 8x then 8x without wt 20# rows 2 sets 10 20# lats 2 sets 10 Black tband trunk flex and ext 20x Feet on ball K2C, rotation, small bridges, isometric abs Passive LE stretching    12/22/23 Nustep level 5 x 10 minutes 20# rows 20# lats 20# HS curls Feet on ball K2C, rotation, small bridges, isometric abs Green tband clamshells Ball b/n knees squeeze Passive stretch LE's  12/08/23 Nustep L 5 10 min Gait around the back parking [place], no rest, good pace 5 min but needed 2 standing rest breaks d/t pan in gluts/HS Feet on ball K2C, rotation, small bridge, isometric abs PROM BIL LE and trunk Black tband 20 x trunk flex and ext Rows 15# 2x10 Lats 15# 2x10 Black bar heel raise and toe raises 20x  STS with wt ball press 10 x   12/01/23 Gait around the back parking [place], no rest, good pace Rows 15# 2x10 Lats 15# 2x10 Straight arm pulls 5# cues for core and posture 15# HS curls Nustep level 5 x 9 minutes PAssive LE stretches Feet on ball K2C, rotation, small bridge, isometric abs STM with T-gun to HS and ITB  11/24/23 Gait around the parking [place] 1 rest break SBA for curbs Nustep level 5 x 6 minutes Feet on ball K2C, rotation, small bridge, isometric abs Ball b/n knees squeeze Green tband clamshells in hooklying Passive stretch STM with the Tgun to the ITB and the HS  PATIENT EDUCATION:  Education details: POC Person educated: Patient Education method: Explanation Education comprehension: verbalized understanding  HOME EXERCISE PROGRAM: 12/01/23 K2C, trunk rotation, HS and piriformis stretches  ASSESSMENT:  CLINICAL IMPRESSION: Pt arrives with less pain but still c/o low energy. Focus some increase ex on cardio and stamina actvities.She tolerates fair, limited by pain and fatigue . Tolerated inside walking better than outside so maybe heat and/or inclines/declines.Goals checked and documented, progressing. OBJECTIVE IMPAIRMENTS: Abnormal gait, cardiopulmonary  status limiting activity, decreased activity tolerance, decreased balance, decreased coordination, decreased endurance, decreased mobility, difficulty walking, decreased ROM, decreased strength, increased fascial restrictions, impaired perceived functional ability, increased muscle spasms, impaired flexibility, improper body mechanics, and pain.   REHAB POTENTIAL: Good  CLINICAL DECISION MAKING: Stable/uncomplicated  EVALUATION COMPLEXITY: Low   GOALS: Goals reviewed with patient? Yes  SHORT TERM GOALS: Target date: 10/24/23  Independent with initial HEP Goal status: met 12/01/23  LONG TERM GOALS: Target date: 12/28/23  Independent wiht posture and body mechanics Goal status: MET 11/09/23  2.  Decrease TUG time to 15 seconds Goal status: MET 11/09/23  3.  Decrease pain 25% Goal status: ongoing 12/01/23  progressing 12/08/23  . 01/27/24 good and bad days overall 50% better ( back and knees pain - not sure what causes pain)progressing 02/17/24 02/25/24 progressing very variable  4.  Be able to cook a meal without pain > 5/10 Goal status: progressing 12/01/23 and 12/08/23. 12/31/23 MET  5.  Independent with HEP or gym Goal status: progressing 12/22/23  01/13/24 evolving progressing 02/17/24  02/25/24 progressing  PLAN:  PT FREQUENCY: 1-2x/week  PT DURATION: 12 weeks  PLANNED INTERVENTIONS: Therapeutic exercises, Therapeutic activity, Neuromuscular re-education, Balance training, Gait training, Patient/Family education, Self Care, Joint mobilization, Dry Needling, Electrical stimulation, Cryotherapy, Moist heat, and Manual therapy.  PLAN FOR NEXT SESSION: pt has 2 more visits, feel like we will look to give HEP and have her try on her own  MARSH SNIFF, PTA 02/25/2024, 8:37 AM [facility] [address] [place], [place], [address] Phone: [phone]   Fax:  [phone]

## 2024-03-01 ENCOUNTER — Ambulatory Visit: Admitting: Physical Therapy

## 2024-03-01 DIAGNOSIS — R2681 Unsteadiness on feet: Secondary | ICD-10-CM

## 2024-03-01 DIAGNOSIS — M5459 Other low back pain: Secondary | ICD-10-CM | POA: Diagnosis not present

## 2024-03-01 DIAGNOSIS — R262 Difficulty in walking, not elsewhere classified: Secondary | ICD-10-CM

## 2024-03-01 DIAGNOSIS — M25561 Pain in right knee: Secondary | ICD-10-CM | POA: Diagnosis not present

## 2024-03-01 DIAGNOSIS — G4733 Obstructive sleep apnea (adult) (pediatric): Secondary | ICD-10-CM | POA: Diagnosis not present

## 2024-03-01 DIAGNOSIS — M62830 Muscle spasm of back: Secondary | ICD-10-CM

## 2024-03-01 NOTE — Therapy (Signed)
 OUTPATIENT PHYSICAL THERAPY THORACOLUMBAR     Patient Name: Jenna Mcdonald MRN: [id number] DOB:12/02/1945, 78 y.o., female Today's Date: 03/01/2024  END OF SESSION:  PT End of Session - 03/01/24 0928     Visit Number 19    Number of Visits 20    Date for PT Re-Evaluation 03/07/24    Authorization Type [employer] 7/10    PT Start Time 0930    PT Stop Time 1015    PT Time Calculation (min) 45 min           Past Medical History:  Diagnosis Date   Anemia    Aortic stenosis    mild by echo 09/2020 and 08/2022 with mean AVG   Breast CA (HCC)    left   Breast cancer (HCC)    Carotid stenosis    1-39% left   Degenerative disc disease, lumbar    of the knees   Diabetes mellitus    Dysrhythmia    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HCC)    Encounter for therapeutic drug monitoring 08/17/2018   Fatigue    Severe secondary to to gemfibrozil   Gastroesophageal reflux disease    denies   GI bleeding    a. 06/2018: EGD showed multiple nonbleeding duodenal ulcers. 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   Hammertoe    left second toe   Hypercholesteremia    Hypertension    Insomnia    Irritable bowel    Low back pain    Metabolic syndrome    Morbid obesity (HCC)    OSA on CPAP \   bipap   Osteoarthritis    Osteopenia    Persistent atrial fibrillation [facility])    s/p afib ablation - Dr. Kelsie 08/2018   Personal history of radiation therapy    Pneumonia    PONV (postoperative nausea and vomiting)    Sigmoid diverticulosis    Thalassemia minor    Unstable angina (HCC) 10/23/2020   Vitamin D  deficiency    Past Surgical History:  Procedure Laterality Date   ATRIAL FIBRILLATION ABLATION N/A 09/21/2018   Procedure: ATRIAL FIBRILLATION ABLATION;  Surgeon: Kelsie Agent, MD;  Location: [facility] INVASIVE CV LAB;  Service: Cardiovascular;  Laterality: N/A;   BIOPSY  07/06/2018   Procedure: BIOPSY;  Surgeon: Rosalie Kitchens,  MD;  Location: [facility] ENDOSCOPY;  Service: Endoscopy;;   BREAST LUMPECTOMY Left 05/28/2009   BREAST SURGERY Left 2010   lumpectomy   CARDIOVERSION N/A 07/07/2018   Procedure: CARDIOVERSION;  Surgeon: Raford Riggs, MD;  Location: [facility] ENDOSCOPY;  Service: Cardiovascular;  Laterality: N/A;   CARDIOVERSION N/A 07/14/2018   Procedure: CARDIOVERSION;  Surgeon: Rolan Ezra RAMAN, MD;  Location: [facility] ENDOSCOPY;  Service: Cardiovascular;  Laterality: N/A;   CESAREAN SECTION     COLONOSCOPY WITH PROPOFOL  N/A 07/06/2018   Procedure: COLONOSCOPY WITH PROPOFOL ;  Surgeon: Rosalie Kitchens, MD;  Location: [facility] ENDOSCOPY;  Service: Endoscopy;  Laterality: N/A;   CYSTECTOMY  1970   pilonidal    DILATION AND CURETTAGE OF UTERUS     ESOPHAGOGASTRODUODENOSCOPY (EGD) WITH PROPOFOL  N/A 07/06/2018   Procedure: ESOPHAGOGASTRODUODENOSCOPY (EGD) WITH PROPOFOL ;  Surgeon: Rosalie Kitchens, MD;  Location: [facility] ENDOSCOPY;  Service: Endoscopy;  Laterality: N/A;   EYE SURGERY Bilateral 07/2013   cataracts   HOT HEMOSTASIS N/A 07/06/2018   Procedure: HOT HEMOSTASIS (ARGON PLASMA COAGULATION/BICAP);  Surgeon: Rosalie Kitchens, MD;  Location: [facility] ENDOSCOPY;  Service: Endoscopy;  Laterality: N/A;   implantable loop recorder placement  01/15/2022   Medtronic Reveal Linq model [id number] implantable loop recorder (SN [id number]) implanted for AF management by Dr Kelsie   MAXIMUM ACCESS (MAS)POSTERIOR LUMBAR INTERBODY FUSION (PLIF) 2 LEVEL N/A 09/22/2013   Procedure: FOR MAXIMUM ACCESS  POSTERIOR LUMBAR INTERBODY FUSION LUMBAR THREE-FOUR FOUR-FIVE;  Surgeon: Alm GORMAN Molt, MD;  Location: [facility] NEURO ORS;  Service: Neurosurgery;  Laterality: N/A;   OOPHORECTOMY     wedge section not rem   REPLACEMENT TOTAL KNEE Left 2011   REPLACEMENT TOTAL KNEE     RIGHT/LEFT HEART CATH AND CORONARY ANGIOGRAPHY N/A 10/26/2020   Procedure: RIGHT/LEFT HEART CATH AND CORONARY ANGIOGRAPHY;  Surgeon: Anner Alm ORN, MD;  Location: [facility] INVASIVE CV LAB;  Service: Cardiovascular;   Laterality: N/A;   TEE WITHOUT CARDIOVERSION N/A 07/07/2018   Procedure: TRANSESOPHAGEAL ECHOCARDIOGRAM (TEE);  Surgeon: Raford Riggs, MD;  Location: [facility] ENDOSCOPY;  Service: Cardiovascular;  Laterality: N/A;   TEE WITHOUT CARDIOVERSION N/A 09/21/2018   Procedure: TRANSESOPHAGEAL ECHOCARDIOGRAM (TEE);  Surgeon: Alveta Aleene PARAS, MD;  Location: [facility] ENDOSCOPY;  Service: Cardiovascular;  Laterality: N/A;   TONSILLECTOMY     TUBAL LIGATION     Patient Active Problem List   Diagnosis Date Noted   Hyperlipidemia 09/25/2023   Shortness of breath    Unstable angina (HCC) 10/23/2020   Hypercoagulable state due to paroxysmal atrial fibrillation (HCC) 10/15/2020   Encounter for therapeutic drug monitoring 08/17/2018   CKD (chronic kidney disease) stage 3, GFR 30-59 ml/min (HCC) 07/21/2018   Chronic anemia 07/21/2018   Anemia    Heme positive stool    Atrial fibrillation with RVR (HCC) 07/04/2018   Paroxysmal atrial fibrillation (HCC) 07/02/2018   Carotid stenosis    Bruit of left carotid artery 09/22/2017   Aortic stenosis    Heart murmur 09/09/2016   Morbid obesity (HCC) 02/05/2016   Dry eye 05/17/2015   Type 2 diabetes mellitus without complication (HCC) 08/10/2014   Breast cancer, left breast (HCC) 07/22/2014   Obesity, Class III, BMI 40-49.9 (morbid obesity) (HCC) 07/06/2014   Macular pseudohole 05/17/2014   History of surgical procedure 05/17/2014   OSA (obstructive sleep apnea) 11/17/2013   S/P lumbar spinal fusion 09/22/2013   Persistent atrial fibrillation (HCC) 09/11/2011    Class: Acute   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HCC) 09/11/2011    Class: Chronic   Essential hypertension 09/11/2011   Exogenous obesity 09/11/2011   Gastroesophageal reflux disease 09/11/2011   Thalassemia minor 09/11/2011    Class: Chronic   Breast cancer (HCC) 09/11/2011    Class: Chronic   Degenerative joint disease 09/11/2011    Class: Chronic   Irritable bowel disease 09/11/2011    Class: Chronic    Lumbar disc disease 09/11/2011    PCP: Dwight, MD  REFERRING PROVIDER: CHARLENA Shutter, DO  REFERRING DIAG: chronic bag and knee pain  Rationale for Evaluation and Treatment: Rehabilitation  THERAPY DIAG:  Other low back pain  Muscle spasm of back  Acute pain of right knee  Unsteadiness on feet  Difficulty in walking, not elsewhere classified  ONSET DATE: 10/14/22  SUBJECTIVE:  SUBJECTIVE STATEMENT: Okay after last session. Just not sleeping well so tired PERTINENT HISTORY:  See above  PAIN:  Are you having pain? 5/10 in the low back, worse with standing , walking and activity, sit and rest pain can be a 5/10 but it is always there, ache, feels weak int eh core, reports knees are sore today after she had a fall  PRECAUTIONS: Other: lumbar fusion 2015  WEIGHT BEARING RESTRICTIONS: No  FALLS:  Has patient fallen in last 6 months? Yes, one fall this AM  LIVING ENVIRONMENT: Lives with: lives with their family Lives in: House/apartment Stairs: Yes: Internal: 15 steps; can reach both Has following equipment at home: has a cane and uses at times  OCCUPATION: retired  PLOF: Independent and gardens, does Presenter, broadcasting, cooks and Lexicographer, shops  PATIENT GOALS: strengthen core and have less pain  NEXT MD VISIT:   OBJECTIVE:   DIAGNOSTIC FINDINGS:  No recent x-rays   PATIENT SURVEYS:  ODI = 54% disability  COGNITION: Overall cognitive status: Within functional limits for tasks assessed     SENSATION: WFL  MUSCLE LENGTH: Very tight HS, piriformis and quads  POSTURE: rounded shoulders and forward head  PALPATION: Tender and tight in the low back mms, tightness iin the rhomboids and the upper traps, very sore in the right low back and into the buttock   LUMBAR ROM:  all motions increased  right low back pain  AROM eval  Flexion Decreased 50%  Extension Decreased 100%  Right lateral flexion Decreased 75%  Left lateral flexion Decreased 75%  Right rotation   Left rotation    (Blank rows = not tested)   Shoulder ROM left : AROM   Flexion: 100, abduction 95, ER 30, IR 40 all increase pain and she is very painful with ER and abduction LOWER EXTREMITY ROM:     Active  Right eval Left eval  Hip flexion    Hip extension    Hip abduction    Hip adduction    Hip internal rotation    Hip external rotation    Knee flexion 10 5  Knee extension 95 100  Ankle dorsiflexion    Ankle plantarflexion    Ankle inversion    Ankle eversion     (Blank rows = not tested)  LOWER EXTREMITY MMT:    MMT Right eval Left eval  Hip flexion 4- 4-  Hip extension 4- 4-  Hip abduction 4- 4-  Hip adduction    Hip internal rotation    Hip external rotation    Knee flexion 3+ 4-  Knee extension 3+ 4-  Ankle dorsiflexion 4 4  Ankle plantarflexion    Ankle inversion    Ankle eversion     (Blank rows = not tested)  FUNCTIONAL TESTS:  ODI= 27/50 54% TUG:  33 seconds  GAIT: Distance walked: 80 feet Assistive device utilized: no device Level of assistance: Complete Independence Comments: slow, right foot turned out, limp on the right  TODAY'S TREATMENT:  DATE:    03/01/24 Nustep level 5 x 10 minutes Resisted gait 3 x 4 way 20 # 5# farmer carry 1 lap each hand Black tband trunk flex and ext 2 sets 10 15# rows 2 sets 10 15# lats 2 sets 10 Leg curls 20# 2x12 Leg extension 5# 2x12 Standing on airex 5# straight arm pulls cues for posture and core 10# AR press STS with 4# press 10 x STM to LB  02/25/24 Nustep level 5 x 10 minutes Amb outside- limited to 3 min with 1 standing rest break- d/t fatigue and pain in back 5# farmer carry 1 lap each hand Black  tband trunk flex and ext 2 sets 10 15# rows 2 sets 10 15# lats 2 sets 10 Leg curls 20# 2x12 Leg extension 5# 2x12 Standing on airex 5# straight arm pulls cues for posture and core 10# AR press STS with 3# press 10 x STM to LB  02/17/24 Nustep level 5 x 10 minutes LEg curls 20# 2x12 Leg extension 5# 2x12 Standing on airex 5# straight arm pulls cues for posture and core 15# rows 15# lats Black tband trunk extension and flexion 10# AR press STM to the low back  02/10/24 Nustep level 5 x 10 minutes Leg curls 15# 5# straight arm pulls 20# lats 20# rows Black tband trunk flexion and extension Looked at her arm for her as she is having pain in the left arm/chest and feels like it is due to the loop recorder that was placed last year, she is tender in the pectoral area, asked her to call the  Feet on ball K2C, rotation, bridge, isometric abs Passive stretch STM to the upper traps and the lumbar area  02/03/24 Nustep level 5 x 10 minutes TUG 15 seconds Seated row 20# Lats 20# 15# leg curls 5# straight arm pulls Black tband lumbar extension while seated Feet on ball K2C, rotation, small bridge, isometric abs Passive stretch LE's STM to the lumbar area with patient sitting  01/27/24 Nustep level 5 x 10 minutes 20# seated row 20# lats Black tband abs and extension 15# leg curls Feet on ball K2C, rotation, small bridge, isometric abs Passive LE stretches  01/20/24 Nustep level 5 x 6 minutes Leg curls 15# 2x10 Seated rows 20# Lats 20# Black tband extension and flexion for core Standing 2.5# hip abduction, marching Feet on ball K2C, rotation, small bridge, isometric abs Passive stretch HS and piriformis Ball b/n knees squeeze Blue tband clamshells  01/13/24 Nustep level 4 x 5 minutes pain Feet on ball K2C, rotation, small bridge, isometric abs Passive LE stretches STM with the Tgun to the buttocks and the lumbar area  01/06/24 Nustep level 6 x 9 minutes 20#  rows 20# lats Black tband trunk flexion and extension Feet on ball K2C, rotation, bridge and isometric abs Biceps, triceps and OH press 3# Passive LE stretches Green tband clamshells Gentle sheet traction   12/31/23 Nustep L 6 10 min Gait around the back parking [place], no rest, good pace 3 min with 1  standing rest breaks d/t pain in gluts and general leg pain Modified Dead lifts 5# 8x then 8x without wt 20# rows 2 sets 10 20# lats 2 sets 10 Black tband trunk flex and ext 20x Feet on ball K2C, rotation, small bridges, isometric abs Passive LE stretching    12/22/23 Nustep level 5 x 10 minutes 20# rows 20# lats 20# HS curls Feet on ball K2C, rotation, small bridges, isometric abs Green  tband clamshells Ball b/n knees squeeze Passive stretch LE's  12/08/23 Nustep L 5 10 min Gait around the back parking [place], no rest, good pace 5 min but needed 2 standing rest breaks d/t pan in gluts/HS Feet on ball K2C, rotation, small bridge, isometric abs PROM BIL LE and trunk Black tband 20 x trunk flex and ext Rows 15# 2x10 Lats 15# 2x10 Black bar heel raise and toe raises 20x STS with wt ball press 10 x   12/01/23 Gait around the back parking [place], no rest, good pace Rows 15# 2x10 Lats 15# 2x10 Straight arm pulls 5# cues for core and posture 15# HS curls Nustep level 5 x 9 minutes PAssive LE stretches Feet on ball K2C, rotation, small bridge, isometric abs STM with T-gun to HS and ITB  11/24/23 Gait around the parking [place] 1 rest break SBA for curbs Nustep level 5 x 6 minutes Feet on ball K2C, rotation, small bridge, isometric abs Ball b/n knees squeeze Green tband clamshells in hooklying Passive stretch STM with the Tgun to the ITB and the HS  PATIENT EDUCATION:  Education details: POC Person educated: Patient Education method: Explanation Education comprehension: verbalized understanding  HOME EXERCISE PROGRAM: 12/01/23 K2C, trunk rotation, HS and piriformis  stretches  ASSESSMENT:  CLINICAL IMPRESSION: Pt arrives doing okay, but not sleeping well so still c/o low energy. Focus some increase ex on cardio and stamina actvities.She tolerates fair, limited by pain and fatigue .Goals checked and documented, progressing. OBJECTIVE IMPAIRMENTS: Abnormal gait, cardiopulmonary status limiting activity, decreased activity tolerance, decreased balance, decreased coordination, decreased endurance, decreased mobility, difficulty walking, decreased ROM, decreased strength, increased fascial restrictions, impaired perceived functional ability, increased muscle spasms, impaired flexibility, improper body mechanics, and pain.   REHAB POTENTIAL: Good  CLINICAL DECISION MAKING: Stable/uncomplicated  EVALUATION COMPLEXITY: Low   GOALS: Goals reviewed with patient? Yes  SHORT TERM GOALS: Target date: 10/24/23  Independent with initial HEP Goal status: met 12/01/23  LONG TERM GOALS: Target date: 12/28/23  Independent wiht posture and body mechanics Goal status: MET 11/09/23  2.  Decrease TUG time to 15 seconds Goal status: MET 11/09/23  3.  Decrease pain 25% Goal status: ongoing 12/01/23  progressing 12/08/23  . 01/27/24 good and bad days overall 50% better ( back and knees pain - not sure what causes pain)progressing 02/17/24 02/25/24 progressing very variable  and 03/01/24  4.  Be able to cook a meal without pain > 5/10 Goal status: progressing 12/01/23 and 12/08/23. 12/31/23 MET  5.  Independent with HEP or gym Goal status: progressing 12/22/23  01/13/24 evolving progressing 02/17/24  02/25/24 progressing.progressing 03/01/24  PLAN:  PT FREQUENCY: 1-2x/week  PT DURATION: 12 weeks  PLANNED INTERVENTIONS: Therapeutic exercises, Therapeutic activity, Neuromuscular re-education, Balance training, Gait training, Patient/Family education, Self Care, Joint mobilization, Dry Needling, Electrical stimulation, Cryotherapy, Moist heat, and Manual therapy.  PLAN FOR NEXT SESSION:  pt has 1 more visit, next session giveHEP and have her try on her own  MARSH SNIFF, PTA 03/01/2024, 9:29 AM [facility] at [facility] [address] [place], [place], [address] Phone: [phone]   Fax:  [phone]

## 2024-03-03 ENCOUNTER — Emergency Department (HOSPITAL_COMMUNITY)
Admission: EM | Admit: 2024-03-03 | Discharge: 2024-03-03 | Disposition: A | Discharge status: Home / Self Care | Attending: Student in an Organized Health Care Education/Training Program | Admitting: Student in an Organized Health Care Education/Training Program

## 2024-03-03 ENCOUNTER — Encounter (HOSPITAL_COMMUNITY): Payer: Self-pay

## 2024-03-03 ENCOUNTER — Emergency Department (HOSPITAL_COMMUNITY)

## 2024-03-03 ENCOUNTER — Other Ambulatory Visit: Payer: Self-pay

## 2024-03-03 DIAGNOSIS — R519 Headache, unspecified: Secondary | ICD-10-CM | POA: Insufficient documentation

## 2024-03-03 DIAGNOSIS — I4891 Unspecified atrial fibrillation: Secondary | ICD-10-CM | POA: Insufficient documentation

## 2024-03-03 DIAGNOSIS — Z8679 Personal history of other diseases of the circulatory system: Secondary | ICD-10-CM | POA: Diagnosis not present

## 2024-03-03 DIAGNOSIS — Z7901 Long term (current) use of anticoagulants: Secondary | ICD-10-CM | POA: Diagnosis not present

## 2024-03-03 DIAGNOSIS — R2681 Unsteadiness on feet: Secondary | ICD-10-CM | POA: Insufficient documentation

## 2024-03-03 DIAGNOSIS — G319 Degenerative disease of nervous system, unspecified: Secondary | ICD-10-CM | POA: Insufficient documentation

## 2024-03-03 DIAGNOSIS — Z79899 Other long term (current) drug therapy: Secondary | ICD-10-CM | POA: Diagnosis not present

## 2024-03-03 DIAGNOSIS — I10 Essential (primary) hypertension: Secondary | ICD-10-CM | POA: Insufficient documentation

## 2024-03-03 DIAGNOSIS — R42 Dizziness and giddiness: Secondary | ICD-10-CM | POA: Diagnosis not present

## 2024-03-03 DIAGNOSIS — H539 Unspecified visual disturbance: Secondary | ICD-10-CM | POA: Diagnosis not present

## 2024-03-03 DIAGNOSIS — D649 Anemia, unspecified: Secondary | ICD-10-CM | POA: Diagnosis not present

## 2024-03-03 DIAGNOSIS — I480 Paroxysmal atrial fibrillation: Secondary | ICD-10-CM | POA: Diagnosis not present

## 2024-03-03 DIAGNOSIS — I6782 Cerebral ischemia: Secondary | ICD-10-CM | POA: Diagnosis not present

## 2024-03-03 DIAGNOSIS — R011 Cardiac murmur, unspecified: Secondary | ICD-10-CM | POA: Diagnosis not present

## 2024-03-03 DIAGNOSIS — I517 Cardiomegaly: Secondary | ICD-10-CM | POA: Diagnosis not present

## 2024-03-03 DIAGNOSIS — G932 Benign intracranial hypertension: Secondary | ICD-10-CM | POA: Diagnosis not present

## 2024-03-03 DIAGNOSIS — I119 Hypertensive heart disease without heart failure: Secondary | ICD-10-CM | POA: Insufficient documentation

## 2024-03-03 LAB — COMPREHENSIVE METABOLIC PANEL WITH GFR
ALT: 14 U/L (ref 0–44)
AST: 19 U/L (ref 15–41)
Albumin: 3.9 g/dL (ref 3.5–5.0)
Alkaline Phosphatase: 74 U/L (ref 38–126)
Anion gap: 9 (ref 5–15)
BUN: 28 mg/dL — ABNORMAL HIGH (ref 8–23)
CO2: 23 mmol/L (ref 22–32)
Calcium: 9.3 mg/dL (ref 8.9–10.3)
Chloride: 106 mmol/L (ref 98–111)
Creatinine, Ser: 1.27 mg/dL — ABNORMAL HIGH (ref 0.44–1.00)
GFR, Estimated: 43 mL/min — ABNORMAL LOW (ref >60)
Glucose, Bld: 180 mg/dL — ABNORMAL HIGH (ref 70–99)
Potassium: 4.1 mmol/L (ref 3.5–5.1)
Sodium: 138 mmol/L (ref 135–145)
Total Bilirubin: 0.7 mg/dL (ref 0.0–1.2)
Total Protein: 6.9 g/dL (ref 6.5–8.1)

## 2024-03-03 LAB — CBC WITH DIFFERENTIAL/PLATELET
Abs Immature Granulocytes: 0.02 K/uL (ref 0.00–0.07)
Basophils Absolute: 0.1 K/uL (ref 0.0–0.1)
Basophils Relative: 1 %
Eosinophils Absolute: 0.1 K/uL (ref 0.0–0.5)
Eosinophils Relative: 2 %
HCT: 30.3 % — ABNORMAL LOW (ref 36.0–46.0)
Hemoglobin: 8.7 g/dL — ABNORMAL LOW (ref 12.0–15.0)
Immature Granulocytes: 0 %
Lymphocytes Relative: 20 %
Lymphs Abs: 1.3 K/uL (ref 0.7–4.0)
MCH: 17.3 pg — ABNORMAL LOW (ref 26.0–34.0)
MCHC: 28.7 g/dL — ABNORMAL LOW (ref 30.0–36.0)
MCV: 60.4 fL — ABNORMAL LOW (ref 80.0–100.0)
Monocytes Absolute: 0.4 K/uL (ref 0.1–1.0)
Monocytes Relative: 6 %
Neutro Abs: 4.9 K/uL (ref 1.7–7.7)
Neutrophils Relative %: 71 %
Platelets: 182 K/uL (ref 150–400)
RBC: 5.02 MIL/uL (ref 3.87–5.11)
RDW: 17.4 % — ABNORMAL HIGH (ref 11.5–15.5)
WBC: 6.8 K/uL (ref 4.0–10.5)
nRBC: 0 % (ref 0.0–0.2)

## 2024-03-03 LAB — RETICULOCYTES
Immature Retic Fract: 18.7 % — ABNORMAL HIGH (ref 2.3–15.9)
RBC.: 5.01 MIL/uL (ref 3.87–5.11)
Retic Count, Absolute: 61.1 K/uL (ref 19.0–186.0)
Retic Ct Pct: 1.2 % (ref 0.4–3.1)

## 2024-03-03 LAB — VITAMIN B12: Vitamin B-12: 469 pg/mL (ref 180–914)

## 2024-03-03 LAB — D-DIMER, QUANTITATIVE: D-Dimer, Quant: 0.44 ug{FEU}/mL (ref 0.00–0.50)

## 2024-03-03 LAB — FOLATE: Folate: 21.8 ng/mL (ref >5.9)

## 2024-03-03 LAB — IRON AND TIBC
Iron: 20 ug/dL — ABNORMAL LOW (ref 28–170)
Saturation Ratios: 5 % — ABNORMAL LOW (ref 10.4–31.8)
TIBC: 434 ug/dL (ref 250–450)
UIBC: 414 ug/dL

## 2024-03-03 LAB — FERRITIN: Ferritin: 6 ng/mL — ABNORMAL LOW (ref 11–307)

## 2024-03-03 MED ORDER — MECLIZINE HCL 25 MG PO TABS
25.0000 mg | ORAL_TABLET | Freq: Once | ORAL | Status: AC
  Administered 2024-03-03: 25 mg via ORAL
  Filled 2024-03-03: qty 1

## 2024-03-03 MED ORDER — LORAZEPAM 2 MG/ML IJ SOLN
1.0000 mg | Freq: Once | INTRAMUSCULAR | Status: AC
  Administered 2024-03-03: 1 mg via INTRAVENOUS
  Filled 2024-03-03: qty 1

## 2024-03-03 MED ORDER — LACTATED RINGERS IV BOLUS
1000.0000 mL | Freq: Once | INTRAVENOUS | Status: AC
  Administered 2024-03-03: 1000 mL via INTRAVENOUS

## 2024-03-03 MED ORDER — GADOBUTROL 1 MMOL/ML IV SOLN
9.0000 mL | Freq: Once | INTRAVENOUS | Status: AC | PRN
  Administered 2024-03-03: 9 mL via INTRAVENOUS

## 2024-03-03 MED ORDER — MECLIZINE HCL 25 MG PO TABS: 25.0000 mg | ORAL_TABLET | Freq: Three times a day (TID) | ORAL | 0 refills | Status: DC | PRN

## 2024-03-03 MED ORDER — ONDANSETRON 4 MG PO TBDP: 4.0000 mg | ORAL_TABLET | Freq: Three times a day (TID) | ORAL | 0 refills | Status: DC | PRN

## 2024-03-03 MED ORDER — ACETAMINOPHEN 325 MG PO TABS
650.0000 mg | ORAL_TABLET | Freq: Once | ORAL | Status: AC
  Administered 2024-03-03: 650 mg via ORAL
  Filled 2024-03-03: qty 2

## 2024-03-03 NOTE — ED Notes (Signed)
 Patient ambulated down the hall to restroom with cane.

## 2024-03-03 NOTE — ED Provider Notes (Signed)
 [place] EMERGENCY DEPARTMENT AT [facility] Provider Note   CSN: [id number] Arrival date & time: 03/03/24  1156     Patient presents with: Headache   Jenna Mcdonald is a 78 y.o. female.   78 year old female presenting to the emergency department for evaluation of her unsteadiness and headache.  She reports that her symptoms have been intermittent over the last few weeks.  She notices her symptoms are worse in the morning and tend to get better throughout the day.  She denies dizziness and states her symptoms are more consistent with an unsteadiness when she is trying to ambulate.  She reports that her symptoms were the worst they have been this morning and she feels pressure in her head. Patient has a past medical history of anemia, aortic stenosis, A-fib, and hypertension.  She denies any weakness, confusion, recent fevers, blood in her stool, or vomiting.   Headache      Prior to Admission medications   Medication Sig Start Date End Date Taking? Authorizing Provider  ezetimibe (ZETIA) 10 MG tablet Take 10 mg by mouth daily.   Yes [provider]  apixaban  (ELIQUIS ) 5 MG TABS tablet Take 1 tablet (5 mg total) by mouth 2 (two) times daily. 05/27/23   Fenton, Clint R, PA  bismuth subsalicylate (PEPTO BISMOL) 262 MG chewable tablet Chew 262-524 mg by mouth 3 (three) times daily as needed for indigestion. Patient not taking: Reported on 09/22/2023    [provider]  Calcium  Carbonate-Vitamin D  (CALCIUM  600 + D PO) Take 1 tablet by mouth daily.     [provider]  Cholecalciferol (VITAMIN D3) 25 MCG (1000 UT) CAPS Take 1,000 Units by mouth every morning.    [provider]  diltiazem  (CARDIZEM  CD) 120 MG 24 hr capsule Take 1 capsule (120 mg total) by mouth daily. 01/14/24   Shlomo Wilbert SAUNDERS, MD  diphenhydramine -acetaminophen  (TYLENOL  PM) 25-500 MG TABS tablet Take 2 tablets by mouth at bedtime as needed (sleep). Patient not taking:  Reported on 09/22/2023    [provider]  Magnesium  Glycinate 100 MG CAPS Take 200 mg by mouth at bedtime as needed (for muscle cramps). Can increase to 400 mg if needed. 09/24/23   Shlomo Wilbert SAUNDERS, MD  metFORMIN  (GLUCOPHAGE -XR) 500 MG 24 hr tablet 1 tablet with evening meal Orally Once a day for 30 days 11/26/22   [provider]  Multiple Vitamin (MULTIVITAMIN) tablet Take 1 tablet by mouth daily with breakfast.    [provider]  ondansetron  (ZOFRAN ) 4 MG tablet Take 4 mg by mouth every 8 (eight) hours as needed for nausea or vomiting. Patient not taking: Reported on 09/22/2023    [provider]  [facility] ULTRA test strip 1 each by Other route daily. 04/22/19   [provider]  tirzepatide  (MOUNJARO ) 7.5 MG/0.5ML Pen Inject 7.5 mg into the skin every 7 (seven) days. 07/09/23     tirzepatide  (MOUNJARO ) 7.5 MG/0.5ML Pen Inject 7.5 mg into the skin once a week. 02/24/24     zolpidem  (AMBIEN ) 5 MG tablet Take 5 mg by mouth at bedtime as needed for sleep. Patient not taking: Reported on 09/22/2023    [provider]    Allergies: Metoprolol , Amiodarone , Anastrozole, Calcitriol, Furosemide , Hydrocodone -acetaminophen , Jardiance  [empagliflozin ], Tamoxifen, Tetracyclines & related, Toprol  xl [metoprolol  tartrate], Verapamil , Vicodin [hydrocodone -acetaminophen ], Aromasin [exemestane], Gemfibrozil, and Other    Review of Systems  Neurological:  Positive for headaches.  All other systems reviewed and are negative.  Updated Vital Signs BP (!) 156/60 (BP Location: Right Arm)   Pulse 77   Temp 97.8 F (36.6 C)   Resp 18   Wt 92.1 kg   SpO2 100%   BMI 34.84 kg/m   Physical Exam Vitals and nursing note reviewed.  Constitutional:      General: She is not in acute distress. HENT:     Head: Normocephalic.  Cardiovascular:     Rate and Rhythm: Normal rate and regular rhythm.     Heart sounds: Murmur heard.  Pulmonary:     Effort: Pulmonary  effort is normal.     Breath sounds: Normal breath sounds.  Abdominal:     Palpations: Abdomen is soft.  Musculoskeletal:        General: Normal range of motion.     Cervical back: Normal range of motion.  Skin:    General: Skin is warm.  Neurological:     Mental Status: She is alert and oriented to person, place, and time.     GCS: GCS eye subscore is 4. GCS verbal subscore is 5. GCS motor subscore is 6.     Cranial Nerves: No cranial nerve deficit, dysarthria or facial asymmetry.     Sensory: No sensory deficit.     Motor: No weakness.     Coordination: Coordination normal.     Gait: Gait abnormal.     (all labs ordered are listed, but only abnormal results are displayed) Labs Reviewed  CBC WITH DIFFERENTIAL/PLATELET - Abnormal; Notable for the following components:      Result Value   Hemoglobin 8.7 (*)    HCT 30.3 (*)    MCV 60.4 (*)    MCH 17.3 (*)    MCHC 28.7 (*)    RDW 17.4 (*)    All other components within normal limits  COMPREHENSIVE METABOLIC PANEL WITH GFR - Abnormal; Notable for the following components:   Glucose, Bld 180 (*)    BUN 28 (*)    Creatinine, Ser 1.27 (*)    GFR, Estimated 43 (*)    All other components within normal limits  RETICULOCYTES - Abnormal; Notable for the following components:   Immature Retic Fract 18.7 (*)    All other components within normal limits  D-DIMER, QUANTITATIVE  VITAMIN B12  FOLATE  IRON AND TIBC  FERRITIN    EKG: EKG Interpretation Date/Time:  Thursday March 03 2024 13:50:59 EDT Ventricular Rate:  76 PR Interval:  158 QRS Duration:  91 QT Interval:  410 QTC Calculation: 461 R Axis:   76  Text Interpretation: Sinus rhythm Borderline T abnormalities, lateral leads Confirmed by Corinthia No ([id number]) on 03/03/2024 2:40:49 PM  Radiology: [facility] Chest Portable 1 View Result Date: 03/03/2024 CLINICAL DATA:  Dizziness. EXAM: PORTABLE CHEST 1 VIEW COMPARISON:  January 25, 2023. FINDINGS: Stable borderline cardiomegaly. Both  lungs are clear. The visualized skeletal structures are unremarkable. IMPRESSION: No active disease. Electronically Signed   By: Lynwood Landy Raddle M.D.   On: 03/03/2024 13:29     Procedures   Medications Ordered in the ED  LORazepam  (ATIVAN ) injection 1 mg (has no administration in time range)  lactated ringers  bolus 1,000 mL (1,000 mLs Intravenous New Bag/Given 03/03/24 1335)                                    Medical Decision Making 78 year old female with a past medical history of  paroxysmal A-fib status post ablation, AS, hypertension, and Mediterranean anemia presents emergency department with unsteadiness. She denies any dizziness when she moves her head or dizziness when laying flat.  She reports a feeling of increased pressure in her head and feels as though she is unsteady on her feet.  Her neurological exam was unremarkable other than an unsteady gait.  EKG showed normal sinus rhythm without any evidence of ischemia.  Neurology Dr. Jerrie was consulted regarding the concerning symptoms including new onset headache and unsteadiness on her feet for several weeks.  She is at high risk for intracranial abnormalities with these new symptoms developing at her age.  We have decided to proceed with an MRI brain with and without to further evaluate for any intracranial abnormalities. MRA brain also ordered for further evaluation.   She does have anemia noted on her lab work at 8.7.  She reports a history of Mediterranean anemia and denies any symptoms from this.  She is denying any abdominal pain or bloody stools.  The anemia could cause dizziness but would be less likely to cause an unsteadiness on your feet when standing and persistent headache.  Her vitals are stable and she is not tachycardic.  Blood pressure mildly elevated at 156/60.  MRI is ordered for further evaluation.  Amount and/or Complexity of Data Reviewed Labs: ordered. Radiology: ordered.  Risk Prescription drug  management.     Final diagnoses:  None    ED Discharge Orders     None          Dioselina Brumbaugh, DO 03/03/24 1443

## 2024-03-03 NOTE — ED Notes (Signed)
 Went to MRI

## 2024-03-03 NOTE — Plan of Care (Signed)
 Discussed briefly with Dr. Corinthia as a curbside  New onset fairly severe headache as well as nonspecific dizziness that is worst in the morning  Patient is on Eliquis   Neurological exam per Dr. Corinthia is normal although she notes she did not check ambulation which is one of the patient's primary complaints  I do think MRI brain with and without contrast would be reasonable for headache workup given her age and remote history of breast cancer.  Would also obtain MRA head without to rule out aneurysm  Dr. Corinthia feels there are other medical reasons for which patient may need admission.  Please do reach out to neurology for formal consultation if there are abnormalities on imaging, or if other acute neurological concerns arise   These are curbside recommendations based upon the information readily available in the chart on brief review as well as history and examination information provided to me by requesting provider and do not replace a full detailed consult  Lola Jernigan MD-PhD [facility] [phone] Available 7 AM to 7 PM, outside these hours please contact Neurologist on call listed on AMION   No charge note

## 2024-03-03 NOTE — Discharge Instructions (Addendum)
 Follow-up with your primary care doctor, ear nose and throat doctor and ophthalmologist as we discussed.  Take meclizine  and Zofran  as needed for dizziness and nausea respectively.  Return if symptoms worsen.

## 2024-03-03 NOTE — ED Triage Notes (Signed)
 POV/ brought by friend/ sent by PCP/ pt c/o head fullness, dizziness, blurred vision, balance issues/ x2 weeks/ worse today/ pt talking in full sentences/ placed in WC/ A&OX4

## 2024-03-03 NOTE — ED Provider Notes (Signed)
 Patient signed out to me awaiting MRI of her brain.  She has been dealing with some unsteadiness headache.  Workup thus far is unremarkable.  Hemoglobin is 8.7 little bit below her baseline but she denies any black or bloody stools.  Neurology has been consulted they recommend MRI of the brain with and without contrast MRI.  Seems like her symptoms have been for the last 2 weeks slightly worse today.  Sounds like she has a history of breast cancer as well.  Her neurological exam with me seems to be okay but she has not really felt good enough to ambulate.  She has been using a cane but does not typically use assistive devices at home.  Overall MRI imaging is unremarkable.  There is no acute findings in the MRI of the head MRA of the head and neck.  No stroke no brain mass.  Patient feeling much better.  Able to ambulate much better with her cane.  She is feeling better after getting some meclizine  as well.  Will have her follow-up with her eye doctor and her ENT doctor with whom she already is established with.  Will prescribe meclizine  and Zofran .  Suspect some sort of peripheral vertigo may be causing the symptoms.  Discharged in good condition.  Understands return precautions.  This chart was dictated using voice recognition software.  Despite best efforts to proofread,  errors can occur which can change the documentation meaning.    Ruthe Cornet, DO 03/03/24 2023

## 2024-03-07 ENCOUNTER — Encounter: Payer: Self-pay | Admitting: Physical Therapy

## 2024-03-07 ENCOUNTER — Ambulatory Visit: Admitting: Physical Therapy

## 2024-03-07 ENCOUNTER — Ambulatory Visit (INDEPENDENT_AMBULATORY_CARE_PROVIDER_SITE_OTHER)

## 2024-03-07 DIAGNOSIS — M5459 Other low back pain: Secondary | ICD-10-CM

## 2024-03-07 DIAGNOSIS — R2681 Unsteadiness on feet: Secondary | ICD-10-CM | POA: Diagnosis not present

## 2024-03-07 DIAGNOSIS — M62830 Muscle spasm of back: Secondary | ICD-10-CM | POA: Diagnosis not present

## 2024-03-07 DIAGNOSIS — M25561 Pain in right knee: Secondary | ICD-10-CM

## 2024-03-07 DIAGNOSIS — I5032 Chronic diastolic (congestive) heart failure: Secondary | ICD-10-CM | POA: Diagnosis not present

## 2024-03-07 DIAGNOSIS — R262 Difficulty in walking, not elsewhere classified: Secondary | ICD-10-CM

## 2024-03-07 NOTE — Therapy (Signed)
 OUTPATIENT PHYSICAL THERAPY THORACOLUMBAR     Patient Name: Jenna Mcdonald MRN: [id number] DOB:04/15/1946, 78 y.o., female Today's Date: 03/07/2024  END OF SESSION:  PT End of Session - 03/07/24 0930     Visit Number 20    Date for PT Re-Evaluation 03/07/24    PT Start Time 0927    PT Stop Time 1015    PT Time Calculation (min) 48 min    Activity Tolerance Patient tolerated treatment well    Behavior During Therapy [facility] for tasks assessed/performed           Past Medical History:  Diagnosis Date   Anemia    Aortic stenosis    mild by echo 09/2020 and 08/2022 with mean AVG   Breast CA (HCC)    left   Breast cancer (HCC)    Carotid stenosis    1-39% left   Degenerative disc disease, lumbar    of the knees   Diabetes mellitus    Dysrhythmia    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HCC)    Encounter for therapeutic drug monitoring 08/17/2018   Fatigue    Severe secondary to to gemfibrozil   Gastroesophageal reflux disease    denies   GI bleeding    a. 06/2018: EGD showed multiple nonbleeding duodenal ulcers. 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   Hammertoe    left second toe   Hypercholesteremia    Hypertension    Insomnia    Irritable bowel    Low back pain    Metabolic syndrome    Morbid obesity (HCC)    OSA on CPAP \   bipap   Osteoarthritis    Osteopenia    Persistent atrial fibrillation [facility])    s/p afib ablation - Dr. Kelsie 08/2018   Personal history of radiation therapy    Pneumonia    PONV (postoperative nausea and vomiting)    Sigmoid diverticulosis    Thalassemia minor    Unstable angina (HCC) 10/23/2020   Vitamin D  deficiency    Past Surgical History:  Procedure Laterality Date   ATRIAL FIBRILLATION ABLATION N/A 09/21/2018   Procedure: ATRIAL FIBRILLATION ABLATION;  Surgeon: Kelsie Agent, MD;  Location: [facility] INVASIVE CV LAB;  Service: Cardiovascular;  Laterality: N/A;    BIOPSY  07/06/2018   Procedure: BIOPSY;  Surgeon: Rosalie Kitchens, MD;  Location: [facility] ENDOSCOPY;  Service: Endoscopy;;   BREAST LUMPECTOMY Left 05/28/2009   BREAST SURGERY Left 2010   lumpectomy   CARDIOVERSION N/A 07/07/2018   Procedure: CARDIOVERSION;  Surgeon: Raford Riggs, MD;  Location: [facility] ENDOSCOPY;  Service: Cardiovascular;  Laterality: N/A;   CARDIOVERSION N/A 07/14/2018   Procedure: CARDIOVERSION;  Surgeon: Rolan Ezra RAMAN, MD;  Location: [facility] ENDOSCOPY;  Service: Cardiovascular;  Laterality: N/A;   CESAREAN SECTION     COLONOSCOPY WITH PROPOFOL  N/A 07/06/2018   Procedure: COLONOSCOPY WITH PROPOFOL ;  Surgeon: Rosalie Kitchens, MD;  Location: [facility] ENDOSCOPY;  Service: Endoscopy;  Laterality: N/A;   CYSTECTOMY  1970   pilonidal    DILATION AND CURETTAGE OF UTERUS     ESOPHAGOGASTRODUODENOSCOPY (EGD) WITH PROPOFOL  N/A 07/06/2018   Procedure: ESOPHAGOGASTRODUODENOSCOPY (EGD) WITH PROPOFOL ;  Surgeon: Rosalie Kitchens, MD;  Location: [facility] ENDOSCOPY;  Service: Endoscopy;  Laterality: N/A;   EYE SURGERY Bilateral 07/2013   cataracts   HOT HEMOSTASIS N/A 07/06/2018   Procedure: HOT HEMOSTASIS (ARGON PLASMA COAGULATION/BICAP);  Surgeon: Rosalie Kitchens, MD;  Location: [facility] ENDOSCOPY;  Service: Endoscopy;  Laterality: N/A;  implantable loop recorder placement  01/15/2022   [employer] Reveal Linq model [id number] implantable loop recorder (SN [id number]) implanted for AF management by Dr Kelsie   MAXIMUM ACCESS (MAS)POSTERIOR LUMBAR INTERBODY FUSION (PLIF) 2 LEVEL N/A 09/22/2013   Procedure: FOR MAXIMUM ACCESS  POSTERIOR LUMBAR INTERBODY FUSION LUMBAR THREE-FOUR FOUR-FIVE;  Surgeon: Alm GORMAN Molt, MD;  Location: [facility] NEURO ORS;  Service: Neurosurgery;  Laterality: N/A;   OOPHORECTOMY     wedge section not rem   REPLACEMENT TOTAL KNEE Left 2011   REPLACEMENT TOTAL KNEE     RIGHT/LEFT HEART CATH AND CORONARY ANGIOGRAPHY N/A 10/26/2020   Procedure: RIGHT/LEFT HEART CATH AND CORONARY ANGIOGRAPHY;  Surgeon: Anner Alm ORN, MD;  Location: [facility] INVASIVE CV LAB;  Service: Cardiovascular;  Laterality: N/A;   TEE WITHOUT CARDIOVERSION N/A 07/07/2018   Procedure: TRANSESOPHAGEAL ECHOCARDIOGRAM (TEE);  Surgeon: Raford Riggs, MD;  Location: [facility] ENDOSCOPY;  Service: Cardiovascular;  Laterality: N/A;   TEE WITHOUT CARDIOVERSION N/A 09/21/2018   Procedure: TRANSESOPHAGEAL ECHOCARDIOGRAM (TEE);  Surgeon: Alveta Aleene PARAS, MD;  Location: [facility] ENDOSCOPY;  Service: Cardiovascular;  Laterality: N/A;   TONSILLECTOMY     TUBAL LIGATION     Patient Active Problem List   Diagnosis Date Noted   Hyperlipidemia 09/25/2023   Shortness of breath    Unstable angina (HCC) 10/23/2020   Hypercoagulable state due to paroxysmal atrial fibrillation (HCC) 10/15/2020   Encounter for therapeutic drug monitoring 08/17/2018   CKD (chronic kidney disease) stage 3, GFR 30-59 ml/min (HCC) 07/21/2018   Chronic anemia 07/21/2018   Anemia    Heme positive stool    Atrial fibrillation with RVR (HCC) 07/04/2018   Paroxysmal atrial fibrillation (HCC) 07/02/2018   Carotid stenosis    Bruit of left carotid artery 09/22/2017   Aortic stenosis    Heart murmur 09/09/2016   Morbid obesity (HCC) 02/05/2016   Dry eye 05/17/2015   Type 2 diabetes mellitus without complication (HCC) 08/10/2014   Breast cancer, left breast (HCC) 07/22/2014   Obesity, Class III, BMI 40-49.9 (morbid obesity) (HCC) 07/06/2014   Macular pseudohole 05/17/2014   History of surgical procedure 05/17/2014   OSA (obstructive sleep apnea) 11/17/2013   S/P lumbar spinal fusion 09/22/2013   Persistent atrial fibrillation (HCC) 09/11/2011    Class: Acute   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HCC) 09/11/2011    Class: Chronic   Essential hypertension 09/11/2011   Exogenous obesity 09/11/2011   Gastroesophageal reflux disease 09/11/2011   Thalassemia minor 09/11/2011    Class: Chronic   Breast cancer (HCC) 09/11/2011    Class: Chronic   Degenerative joint disease 09/11/2011    Class:  Chronic   Irritable bowel disease 09/11/2011    Class: Chronic   Lumbar disc disease 09/11/2011    PCP: Dwight, MD  REFERRING PROVIDER: CHARLENA Shutter, DO  REFERRING DIAG: chronic bag and knee pain  Rationale for Evaluation and Treatment: Rehabilitation  THERAPY DIAG:  Other low back pain  Muscle spasm of back  Acute pain of right knee  Unsteadiness on feet  Difficulty in walking, not elsewhere classified  ONSET DATE: 10/14/22  SUBJECTIVE:  SUBJECTIVE STATEMENT: Patient reports that she went to the ED last week due to dizziness, she had an MRI and everything was negative.  She is going to see MD this week regarding the chest.  Reports that pain in the buttock today is a 7/10  PERTINENT HISTORY:    See above  PAIN:  Are you having pain? 5/10 in the low back, worse with standing , walking and activity, sit and rest pain can be a 5/10 but it is always there, ache, feels weak int eh core, reports knees are sore today after she had a fall  PRECAUTIONS: Other: lumbar fusion 2015  WEIGHT BEARING RESTRICTIONS: No  FALLS:  Has patient fallen in last 6 months? Yes, one fall this AM  LIVING ENVIRONMENT: Lives with: lives with their family Lives in: House/apartment Stairs: Yes: Internal: 15 steps; can reach both Has following equipment at home: has a cane and uses at times  OCCUPATION: retired  PLOF: Independent and gardens, does Presenter, broadcasting, cooks and Lexicographer, shops  PATIENT GOALS: strengthen core and have less pain  NEXT MD VISIT:   OBJECTIVE:   DIAGNOSTIC FINDINGS:  No recent x-rays   PATIENT SURVEYS:  ODI = 54% disability  COGNITION: Overall cognitive status: Within functional limits for tasks assessed     SENSATION: WFL  MUSCLE LENGTH: Very tight HS, piriformis and  quads  POSTURE: rounded shoulders and forward head  PALPATION: Tender and tight in the low back mms, tightness iin the rhomboids and the upper traps, very sore in the right low back and into the buttock   LUMBAR ROM:  all motions increased right low back pain  AROM eval  Flexion Decreased 50%  Extension Decreased 100%  Right lateral flexion Decreased 75%  Left lateral flexion Decreased 75%  Right rotation   Left rotation    (Blank rows = not tested)   Shoulder ROM left : AROM   Flexion: 100, abduction 95, ER 30, IR 40 all increase pain and she is very painful with ER and abduction LOWER EXTREMITY ROM:     Active  Right eval Left eval  Hip flexion    Hip extension    Hip abduction    Hip adduction    Hip internal rotation    Hip external rotation    Knee flexion 10 5  Knee extension 95 100  Ankle dorsiflexion    Ankle plantarflexion    Ankle inversion    Ankle eversion     (Blank rows = not tested)  LOWER EXTREMITY MMT:    MMT Right eval Left eval  Hip flexion 4- 4-  Hip extension 4- 4-  Hip abduction 4- 4-  Hip adduction    Hip internal rotation    Hip external rotation    Knee flexion 3+ 4-  Knee extension 3+ 4-  Ankle dorsiflexion 4 4  Ankle plantarflexion    Ankle inversion    Ankle eversion     (Blank rows = not tested)  FUNCTIONAL TESTS:  ODI= 27/50 54% TUG:  33 seconds  GAIT: Distance walked: 80 feet Assistive device utilized: no device Level of assistance: Complete Independence Comments: slow, right foot turned out, limp on the right  TODAY'S TREATMENT:  DATE:   03/07/24 Nustep level 5 x 9 minutes 5# straight arm pulls cues for posture and core activation 20# rows 20# lats Black tband lumbar extension Feet on ball K2C, rotation, small bridge, isometric abs Discussion with patient regarding HEP, posture and body  mechanics  03/01/24 Nustep level 5 x 10 minutes Resisted gait 3 x 4 way 20 # 5# farmer carry 1 lap each hand Black tband trunk flex and ext 2 sets 10 15# rows 2 sets 10 15# lats 2 sets 10 Leg curls 20# 2x12 Leg extension 5# 2x12 Standing on airex 5# straight arm pulls cues for posture and core 10# AR press STS with 4# press 10 x STM to LB  02/25/24 Nustep level 5 x 10 minutes Amb outside- limited to 3 min with 1 standing rest break- d/t fatigue and pain in back 5# farmer carry 1 lap each hand Black tband trunk flex and ext 2 sets 10 15# rows 2 sets 10 15# lats 2 sets 10 Leg curls 20# 2x12 Leg extension 5# 2x12 Standing on airex 5# straight arm pulls cues for posture and core 10# AR press STS with 3# press 10 x STM to LB  02/17/24 Nustep level 5 x 10 minutes LEg curls 20# 2x12 Leg extension 5# 2x12 Standing on airex 5# straight arm pulls cues for posture and core 15# rows 15# lats Black tband trunk extension and flexion 10# AR press STM to the low back  02/10/24 Nustep level 5 x 10 minutes Leg curls 15# 5# straight arm pulls 20# lats 20# rows Black tband trunk flexion and extension Looked at her arm for her as she is having pain in the left arm/chest and feels like it is due to the loop recorder that was placed last year, she is tender in the pectoral area, asked her to call the  Feet on ball K2C, rotation, bridge, isometric abs Passive stretch STM to the upper traps and the lumbar area  02/03/24 Nustep level 5 x 10 minutes TUG 15 seconds Seated row 20# Lats 20# 15# leg curls 5# straight arm pulls Black tband lumbar extension while seated Feet on ball K2C, rotation, small bridge, isometric abs Passive stretch LE's STM to the lumbar area with patient sitting  01/27/24 Nustep level 5 x 10 minutes 20# seated row 20# lats Black tband abs and extension 15# leg curls Feet on ball K2C, rotation, small bridge, isometric abs Passive LE stretches  01/20/24 Nustep  level 5 x 6 minutes Leg curls 15# 2x10 Seated rows 20# Lats 20# Black tband extension and flexion for core Standing 2.5# hip abduction, marching Feet on ball K2C, rotation, small bridge, isometric abs Passive stretch HS and piriformis Ball b/n knees squeeze Blue tband clamshells  01/13/24 Nustep level 4 x 5 minutes pain Feet on ball K2C, rotation, small bridge, isometric abs Passive LE stretches STM with the Tgun to the buttocks and the lumbar area  01/06/24 Nustep level 6 x 9 minutes 20# rows 20# lats Black tband trunk flexion and extension Feet on ball K2C, rotation, bridge and isometric abs Biceps, triceps and OH press 3# Passive LE stretches Green tband clamshells Gentle sheet traction   12/31/23 Nustep L 6 10 min Gait around the back parking [place], no rest, good pace 3 min with 1  standing rest breaks d/t pain in gluts and general leg pain Modified Dead lifts 5# 8x then 8x without wt 20# rows 2 sets 10 20# lats 2 sets 10 Black tband trunk  flex and ext 20x Feet on ball K2C, rotation, small bridges, isometric abs Passive LE stretching    12/22/23 Nustep level 5 x 10 minutes 20# rows 20# lats 20# HS curls Feet on ball K2C, rotation, small bridges, isometric abs Green tband clamshells Ball b/n knees squeeze Passive stretch LE's  12/08/23 Nustep L 5 10 min Gait around the back parking [place], no rest, good pace 5 min but needed 2 standing rest breaks d/t pan in gluts/HS Feet on ball K2C, rotation, small bridge, isometric abs PROM BIL LE and trunk Black tband 20 x trunk flex and ext Rows 15# 2x10 Lats 15# 2x10 Black bar heel raise and toe raises 20x STS with wt ball press 10 x   12/01/23 Gait around the back parking [place], no rest, good pace Rows 15# 2x10 Lats 15# 2x10 Straight arm pulls 5# cues for core and posture 15# HS curls Nustep level 5 x 9 minutes PAssive LE stretches Feet on ball K2C, rotation, small bridge, isometric abs STM with T-gun to HS  and ITB  11/24/23 Gait around the parking [place] 1 rest break SBA for curbs Nustep level 5 x 6 minutes Feet on ball K2C, rotation, small bridge, isometric abs Ball b/n knees squeeze Green tband clamshells in hooklying Passive stretch STM with the Tgun to the ITB and the HS  PATIENT EDUCATION:  Education details: POC Person educated: Patient Education method: Explanation Education comprehension: verbalized understanding  HOME EXERCISE PROGRAM: 12/01/23 K2C, trunk rotation, HS and piriformis stretches  ASSESSMENT:  CLINICAL IMPRESSION: Patient has been to the ED last week due to dizziness, she had an MRI, it was negative.  She is having a little more buttock pain today, reports that she has not done much due to the dizziness but reports that the dizziness is better in the past day or two.  We discussed today being our last day and that she really needs to take time for her self to do the HEP and [facility]  We discussed posture and body mechanics. OBJECTIVE IMPAIRMENTS: Abnormal gait, cardiopulmonary status limiting activity, decreased activity tolerance, decreased balance, decreased coordination, decreased endurance, decreased mobility, difficulty walking, decreased ROM, decreased strength, increased fascial restrictions, impaired perceived functional ability, increased muscle spasms, impaired flexibility, improper body mechanics, and pain.   REHAB POTENTIAL: Good  CLINICAL DECISION MAKING: Stable/uncomplicated  EVALUATION COMPLEXITY: Low   GOALS: Goals reviewed with patient? Yes  SHORT TERM GOALS: Target date: 10/24/23  Independent with initial HEP Goal status: met 12/01/23  LONG TERM GOALS: Target date: 12/28/23  Independent wiht posture and body mechanics Goal status: MET 11/09/23  2.  Decrease TUG time to 15 seconds Goal status: MET 11/09/23  3.  Decrease pain 25% Goal status: ongoing 12/01/23  progressing 12/08/23  . 01/27/24 good and bad days overall 50% better (  back and knees pain - not sure what causes pain)progressing 02/17/24 02/25/24 progressing very variable  and 03/01/24  4.  Be able to cook a meal without pain > 5/10 Goal status: progressing 12/01/23 and 12/08/23. 12/31/23 MET  5.  Independent with HEP or gym Goal status: progressing 12/22/23  01/13/24 evolving progressing 02/17/24  02/25/24 progressing.progressing 03/01/24, discussed the gym program and encouraged this [facility] 03/07/24  PLAN:  PT FREQUENCY: 1-2x/week  PT DURATION: 12 weeks  PLANNED INTERVENTIONS: Therapeutic exercises, Therapeutic activity, Neuromuscular re-education, Balance training, Gait training, Patient/Family education, Self Care, Joint mobilization, Dry Needling, Electrical stimulation, Cryotherapy, Moist  heat, and Manual therapy.  PLAN FOR NEXT SESSION: D/C goals met except pain is elevated today  OBADIAH OZELL ORN, PT 03/07/2024, 9:32 AM [facility] [address] [facility] [place], [place], [address] Phone: [phone]   Fax:  [phone]

## 2024-03-08 DIAGNOSIS — H9202 Otalgia, left ear: Secondary | ICD-10-CM | POA: Diagnosis not present

## 2024-03-08 DIAGNOSIS — R2689 Other abnormalities of gait and mobility: Secondary | ICD-10-CM | POA: Diagnosis not present

## 2024-03-08 DIAGNOSIS — H538 Other visual disturbances: Secondary | ICD-10-CM | POA: Diagnosis not present

## 2024-03-08 DIAGNOSIS — H9042 Sensorineural hearing loss, unilateral, left ear, with unrestricted hearing on the contralateral side: Secondary | ICD-10-CM | POA: Diagnosis not present

## 2024-03-08 LAB — CUP PACEART REMOTE DEVICE CHECK
Date Time Interrogation Session: 20250713234042
Implantable Pulse Generator Implant Date: 20230524

## 2024-03-11 DIAGNOSIS — H903 Sensorineural hearing loss, bilateral: Secondary | ICD-10-CM | POA: Diagnosis not present

## 2024-03-14 ENCOUNTER — Ambulatory Visit (INDEPENDENT_AMBULATORY_CARE_PROVIDER_SITE_OTHER): Admitting: Diagnostic Neuroimaging

## 2024-03-14 ENCOUNTER — Encounter: Payer: Self-pay | Admitting: Diagnostic Neuroimaging

## 2024-03-14 VITALS — Ht 64.0 in | Wt 207.0 lb

## 2024-03-14 DIAGNOSIS — D509 Iron deficiency anemia, unspecified: Secondary | ICD-10-CM

## 2024-03-14 DIAGNOSIS — R42 Dizziness and giddiness: Secondary | ICD-10-CM

## 2024-03-14 DIAGNOSIS — R2689 Other abnormalities of gait and mobility: Secondary | ICD-10-CM | POA: Diagnosis not present

## 2024-03-14 NOTE — Progress Notes (Signed)
 [facility]  PATIENT: Jenna Mcdonald DOB: 1946-07-24  REFERRING CLINICIAN: Carlie Clark, MD HISTORY FROM: patient REASON FOR VISIT: new consult   HISTORICAL  CHIEF COMPLAINT:  Chief Complaint  Patient presents with   New Patient (Initial Visit)    Pt alone, rm 7. States that she had such bad dizziness and balance concerns and she went to ER 7/10. She was complaining of headache. They did MRI which was negative. Was advised to see ENT and neuro. States that ENT doc didn't indicate anything of concern. Mentioned 2-3 weeks she would have episodes that lead up to the 7/10. She does admit that she may not stay hydrated.    HISTORY OF PRESENT ILLNESS:   78 year old female here for evaluation of imbalance and lightheadedness.  Symptoms started 3 weeks ago.  Patient notes some symptoms when she stands up and feels like she could lose her balance or pass out.  Symptoms last for few minutes up to an hour at a time.  She has had about 10 episodes since that time.  Also some chronic bilateral knee pain, low back pain issues.  Had a severe episode on 03/03/2024 and went to the emergency room.  MRI of the brain, MRA of head and neck were unremarkable.  Also history of some hearing loss issues.  Had a spinal fluid leak repair surgery in the past.  Has been to ENT recently for evaluation.   REVIEW OF SYSTEMS: Full 14 system review of systems performed and negative with exception of: As per HPI.  ALLERGIES: Allergies  Allergen Reactions   Metoprolol  Shortness Of Breath, Diarrhea and Other (See Comments)    Fatigue    Amiodarone  Other (See Comments)    Reaction not recalled   Anastrozole Other (See Comments)    Fatigue   Calcitriol Other (See Comments)    Reaction not noted   Furosemide  Other (See Comments)    Cramps   Hydrocodone -Acetaminophen  Nausea And Vomiting and Other (See Comments)    Patient reports terrible nausea/GI upset; plain Tylenol  is ok with  patient   Jardiance  Casimir.Casey ] Other (See Comments)    Severe muscle and bone pain   Tamoxifen Other (See Comments)    Bone and body aches    Tetracyclines & Related Other (See Comments)    Vaginal infections     Toprol  Xl [Metoprolol  Tartrate] Other (See Comments)    Hair loss   Verapamil  Other (See Comments)    Unknown   Vicodin [Hydrocodone -Acetaminophen ] Nausea And Vomiting and Other (See Comments)    Patient reports terrible nausea; plain tylenol  is ok with patient   Aromasin [Exemestane] Other (See Comments)    Hair loss    Gemfibrozil Other (See Comments)    Severe fatigue   Other Other (See Comments)    Numerous cancer drugs - hair loss and/or could not tolerate    HOME MEDICATIONS: Outpatient Medications Prior to Visit  Medication Sig Dispense Refill   apixaban  (ELIQUIS ) 5 MG TABS tablet Take 1 tablet (5 mg total) by mouth 2 (two) times daily. 180 tablet 3   bismuth subsalicylate (PEPTO BISMOL) 262 MG chewable tablet Chew 262-524 mg by mouth 3 (three) times daily as needed for indigestion.     Calcium  Carbonate-Vitamin D  (CALCIUM  600 + D PO) Take 1 tablet by mouth daily.      Cholecalciferol (VITAMIN D3) 25 MCG (1000 UT) CAPS Take 1,000 Units by mouth every morning.     diltiazem  (CARDIZEM  CD) 120 MG  24 hr capsule Take 1 capsule (120 mg total) by mouth daily. 90 capsule 2   diphenhydramine -acetaminophen  (TYLENOL  PM) 25-500 MG TABS tablet Take 2 tablets by mouth at bedtime as needed (sleep).     Magnesium  Glycinate 100 MG CAPS Take 200 mg by mouth at bedtime as needed (for muscle cramps). Can increase to 400 mg if needed.     zolpidem  (AMBIEN ) 5 MG tablet Take 5 mg by mouth at bedtime as needed for sleep.     ondansetron  (ZOFRAN -ODT) 4 MG disintegrating tablet Take 1 tablet (4 mg total) by mouth every 8 (eight) hours as needed for nausea or vomiting. 20 tablet 0   ezetimibe (ZETIA) 10 MG tablet Take 10 mg by mouth daily.     ONETOUCH ULTRA test strip 1 each by Other  route daily.     tirzepatide  (MOUNJARO ) 7.5 MG/0.5ML Pen Inject 7.5 mg into the skin every 7 (seven) days. 6 mL 1   tirzepatide  (MOUNJARO ) 7.5 MG/0.5ML Pen Inject 7.5 mg into the skin once a week. 6 mL 1   meclizine  (ANTIVERT ) 25 MG tablet Take 1 tablet (25 mg total) by mouth 3 (three) times daily as needed for dizziness. 30 tablet 0   metFORMIN  (GLUCOPHAGE -XR) 500 MG 24 hr tablet 1 tablet with evening meal Orally Once a day for 30 days     Multiple Vitamin (MULTIVITAMIN) tablet Take 1 tablet by mouth daily with breakfast.     ondansetron  (ZOFRAN ) 4 MG tablet Take 4 mg by mouth every 8 (eight) hours as needed for nausea or vomiting. (Patient not taking: Reported on 09/22/2023)     No facility-administered medications prior to visit.    PAST MEDICAL HISTORY: Past Medical History:  Diagnosis Date   Anemia    Aortic stenosis    mild by echo 09/2020 and 08/2022 with mean AVG   Breast CA (HCC)    left   Breast cancer (HCC)    Carotid stenosis    1-39% left   Degenerative disc disease, lumbar    of the knees   Diabetes mellitus    Dysrhythmia    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HCC)    Encounter for therapeutic drug monitoring 08/17/2018   Fatigue    Severe secondary to to gemfibrozil   Gastroesophageal reflux disease    denies   GI bleeding    a. 06/2018: EGD showed multiple nonbleeding duodenal ulcers. 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   Hammertoe    left second toe   Hypercholesteremia    Hypertension    Insomnia    Irritable bowel    Low back pain    Metabolic syndrome    Morbid obesity (HCC)    OSA on CPAP \   bipap   Osteoarthritis    Osteopenia    Persistent atrial fibrillation [facility])    s/p afib ablation - Dr. Kelsie 08/2018   Personal history of radiation therapy    Pneumonia    PONV (postoperative nausea and vomiting)    Sigmoid diverticulosis    Thalassemia minor    Unstable angina (HCC)  10/23/2020   Vitamin D  deficiency     PAST SURGICAL HISTORY: Past Surgical History:  Procedure Laterality Date   ATRIAL FIBRILLATION ABLATION N/A 09/21/2018   Procedure: ATRIAL FIBRILLATION ABLATION;  Surgeon: Kelsie Agent, MD;  Location: [facility] INVASIVE CV LAB;  Service: Cardiovascular;  Laterality: N/A;   BIOPSY  07/06/2018   Procedure: BIOPSY;  Surgeon: Rosalie Kitchens,  MD;  Location: [facility] ENDOSCOPY;  Service: Endoscopy;;   BREAST LUMPECTOMY Left 05/28/2009   BREAST SURGERY Left 2010   lumpectomy   CARDIOVERSION N/A 07/07/2018   Procedure: CARDIOVERSION;  Surgeon: Raford Riggs, MD;  Location: [facility] ENDOSCOPY;  Service: Cardiovascular;  Laterality: N/A;   CARDIOVERSION N/A 07/14/2018   Procedure: CARDIOVERSION;  Surgeon: Rolan Ezra RAMAN, MD;  Location: [facility] ENDOSCOPY;  Service: Cardiovascular;  Laterality: N/A;   CESAREAN SECTION     COLONOSCOPY WITH PROPOFOL  N/A 07/06/2018   Procedure: COLONOSCOPY WITH PROPOFOL ;  Surgeon: Rosalie Kitchens, MD;  Location: [facility] ENDOSCOPY;  Service: Endoscopy;  Laterality: N/A;   CYSTECTOMY  1970   pilonidal    DILATION AND CURETTAGE OF UTERUS     ESOPHAGOGASTRODUODENOSCOPY (EGD) WITH PROPOFOL  N/A 07/06/2018   Procedure: ESOPHAGOGASTRODUODENOSCOPY (EGD) WITH PROPOFOL ;  Surgeon: Rosalie Kitchens, MD;  Location: [facility] ENDOSCOPY;  Service: Endoscopy;  Laterality: N/A;   EYE SURGERY Bilateral 07/2013   cataracts   HOT HEMOSTASIS N/A 07/06/2018   Procedure: HOT HEMOSTASIS (ARGON PLASMA COAGULATION/BICAP);  Surgeon: Rosalie Kitchens, MD;  Location: [facility] ENDOSCOPY;  Service: Endoscopy;  Laterality: N/A;   implantable loop recorder placement  01/15/2022   Medtronic Reveal Linq model [id number] implantable loop recorder (SN [id number]) implanted for AF management by Dr Kelsie   MAXIMUM ACCESS (MAS)POSTERIOR LUMBAR INTERBODY FUSION (PLIF) 2 LEVEL N/A 09/22/2013   Procedure: FOR MAXIMUM ACCESS  POSTERIOR LUMBAR INTERBODY FUSION LUMBAR THREE-FOUR FOUR-FIVE;  Surgeon: Alm RAMAN Molt, MD;  Location:  [facility] NEURO ORS;  Service: Neurosurgery;  Laterality: N/A;   OOPHORECTOMY     wedge section not rem   REPLACEMENT TOTAL KNEE Left 2011   REPLACEMENT TOTAL KNEE     RIGHT/LEFT HEART CATH AND CORONARY ANGIOGRAPHY N/A 10/26/2020   Procedure: RIGHT/LEFT HEART CATH AND CORONARY ANGIOGRAPHY;  Surgeon: Anner Alm ORN, MD;  Location: [facility] INVASIVE CV LAB;  Service: Cardiovascular;  Laterality: N/A;   TEE WITHOUT CARDIOVERSION N/A 07/07/2018   Procedure: TRANSESOPHAGEAL ECHOCARDIOGRAM (TEE);  Surgeon: Raford Riggs, MD;  Location: [facility] ENDOSCOPY;  Service: Cardiovascular;  Laterality: N/A;   TEE WITHOUT CARDIOVERSION N/A 09/21/2018   Procedure: TRANSESOPHAGEAL ECHOCARDIOGRAM (TEE);  Surgeon: Alveta Aleene PARAS, MD;  Location: [facility] ENDOSCOPY;  Service: Cardiovascular;  Laterality: N/A;   TONSILLECTOMY     TUBAL LIGATION      FAMILY HISTORY: Family History  Problem Relation Age of Onset   Anesthesia problems Mother    Diabetes Mother    Diabetes Father     SOCIAL HISTORY: Social History   Socioeconomic History   Marital status: Widowed    Spouse name: Not on file   Number of children: Not on file   Years of education: Not on file   Highest education level: Not on file  Occupational History   Not on file  Tobacco Use   Smoking status: Former    Current packs/day: 0.00    Average packs/day: 0.5 packs/day for 10.0 years (5.0 ttl pk-yrs)    Types: Cigarettes    Start date: 11/11/1993    Quit date: 11/12/2003    Years since quitting: 20.3   Smokeless tobacco: Never   Tobacco comments:    Former smoker 05/13/23  Vaping Use   Vaping status: Never Used  Substance and Sexual Activity   Alcohol use: Yes    Comment: occasionally   Drug use: No   Sexual activity: Not Currently    Birth control/protection: Post-menopausal  Other Topics Concern   Not on file  Social History Narrative  Not on file   Social Drivers of Health   Financial Resource Strain: Not on file  Food Insecurity: No  Food Insecurity (02/17/2023)   Hunger Vital Sign    Worried About Running Out of Food in the Last Year: Never true    Ran Out of Food in the Last Year: Never true  Transportation Needs: No Transportation Needs (02/17/2023)   PRAPARE - Administrator, Civil Service (Medical): No    Lack of Transportation (Non-Medical): No  Physical Activity: Not on file  Stress: Not on file  Social Connections: Not on file  Intimate Partner Violence: Not At Risk (02/17/2023)   Humiliation, Afraid, Rape, and Kick questionnaire    Fear of Current or Ex-Partner: No    Emotionally Abused: No    Physically Abused: No    Sexually Abused: No     PHYSICAL EXAM  GENERAL EXAM/CONSTITUTIONAL: Vitals:  Vitals:   03/14/24 1236  Weight: 207 lb (93.9 kg)  Height: 5' 4 (1.626 m)  Orthostatic VS for the past 24 hrs (Last 3 readings):  BP- Lying Pulse- Lying BP- Standing at 3 minutes Pulse- Standing at 3 minutes  03/14/24 1316 141/77 73 143/66 78    Body mass index is 35.53 kg/m. Wt Readings from Last 3 Encounters:  03/14/24 207 lb (93.9 kg)  03/03/24 203 lb (92.1 kg)  09/22/23 205 lb 12.8 oz (93.4 kg)   Patient is in no distress; well developed, nourished and groomed; neck is supple  CARDIOVASCULAR: Examination of carotid arteries is normal; no carotid bruits Regular rate and rhythm, no murmurs Examination of peripheral vascular system by observation and palpation is normal  EYES: Ophthalmoscopic exam of optic discs and posterior segments is normal; no papilledema or hemorrhages No results found.  MUSCULOSKELETAL: Gait, strength, tone, movements noted in Neurologic exam below  NEUROLOGIC: MENTAL STATUS:      No data to display         awake, alert, oriented to person, place and time recent and remote memory intact normal attention and concentration language fluent, comprehension intact, naming intact fund of knowledge appropriate  CRANIAL NERVE:  2nd - no papilledema on  fundoscopic exam 2nd, 3rd, 4th, 6th - pupils equal and reactive to light, visual fields full to confrontation, extraocular muscles intact, no nystagmus 5th - facial sensation symmetric 7th - facial strength symmetric 8th - hearing intact 9th - palate elevates symmetrically, uvula midline 11th - shoulder shrug symmetric 12th - tongue protrusion midline  MOTOR:  normal bulk and tone, full strength in the BUE, BLE  SENSORY:  normal and symmetric to light touch, temperature, vibration  COORDINATION:  finger-nose-finger, fine finger movements normal  REFLEXES:  deep tendon reflexes present and symmetric  GAIT/STATION:  narrow based gait     DIAGNOSTIC DATA (LABS, IMAGING, TESTING) - I reviewed patient records, labs, notes, testing and imaging myself where available.  Lab Results  Component Value Date   WBC 6.8 03/03/2024   HGB 8.7 (L) 03/03/2024   HCT 30.3 (L) 03/03/2024   MCV 60.4 (L) 03/03/2024   PLT 182 03/03/2024      Component Value Date/Time   NA 138 03/03/2024 1208   NA 139 07/29/2021 0000   NA 143 07/18/2014 1446   K 4.1 03/03/2024 1208   K 4.3 07/18/2014 1446   CL 106 03/03/2024 1208   CL 106 05/11/2012 1632   CO2 23 03/03/2024 1208   CO2 27 07/18/2014 1446   GLUCOSE 180 (H) 03/03/2024 1208  GLUCOSE 183 (H) 07/18/2014 1446   GLUCOSE 218 (H) 05/11/2012 1632   BUN 28 (H) 03/03/2024 1208   BUN 23 07/29/2021 0000   BUN 15.9 07/18/2014 1446   CREATININE 1.27 (H) 03/03/2024 1208   CREATININE 0.96 09/20/2015 0922   CREATININE 1.0 07/18/2014 1446   CALCIUM  9.3 03/03/2024 1208   CALCIUM  10.3 07/18/2014 1446   PROT 6.9 03/03/2024 1208   PROT 7.0 11/16/2020 0831   PROT 7.4 07/18/2014 1446   ALBUMIN  3.9 03/03/2024 1208   ALBUMIN  4.4 11/16/2020 0831   ALBUMIN  4.1 07/18/2014 1446   AST 19 03/03/2024 1208   AST 22 07/18/2014 1446   ALT 14 03/03/2024 1208   ALT 38 07/18/2014 1446   ALKPHOS 74 03/03/2024 1208   ALKPHOS 105 07/18/2014 1446   BILITOT 0.7  03/03/2024 1208   BILITOT 0.6 11/16/2020 0831   BILITOT 0.52 07/18/2014 1446   GFRNONAA 43 (L) 03/03/2024 1208   GFRAA 59 (L) 09/21/2018 0746   Lab Results  Component Value Date   CHOL 132 09/16/2021   HDL 46 09/16/2021   LDLCALC 52 09/16/2021   TRIG 210 (H) 09/16/2021   CHOLHDL 2.9 09/16/2021   Lab Results  Component Value Date   HGBA1C 7.6 (H) 10/23/2020   Lab Results  Component Value Date   VITAMINB12 469 03/03/2024   Lab Results  Component Value Date   TSH 1.706 01/25/2023   Component Ref Range & Units (hover) 11 d ago 5 yr ago 14 yr ago  Ferritin 6 Low  14 CM 185 R        Component Ref Range & Units (hover) 11 d ago 5 yr ago 14 yr ago  Iron 20 Low  16 Low  87 R     03/03/24  MRI HEAD: 1. No acute intracranial abnormality. 2. Mild age-related cerebral atrophy with chronic small vessel ischemic disease.   MRA HEAD:  Normal intracranial MRA. No large vessel occlusion, hemodynamically significant stenosis, or other acute vascular abnormality. No aneurysm.   MRA NECK: Normal MRA of the neck. No hemodynamically significant stenosis or other acute vascular abnormality.    ASSESSMENT AND PLAN  78 y.o. year old female here with:  Dx:  1. Orthostatic lightheadedness   2. Imbalance   3. Iron deficiency anemia, unspecified iron deficiency anemia type     PLAN:  ORTHOSTATIC LIGHTHEADEDNESS / IMBALANCE  - check orthostatic vitals --> not orthostatic today - stay hydrated; monitor vitals at home - follow up iron deficiency anemia with PCP - follow up with cardiology (moderate aortic stenosis) - also with chronic low back pain, knee pain (continue PT exercises; fall precautions reviewed)  Return for return to PCP.    EDUARD FABIENE HANLON, MD 03/14/2024, 5:19 PM Certified in Neurology, Neurophysiology and Neuroimaging  [facility] [address] [place], [place] [address] [phone]

## 2024-03-14 NOTE — Patient Instructions (Signed)
 ORTHOSTATIC LIGHTHEADEDNESS / IMBALANCE  - check orthostatic vitals - stay hydrated; monitor vitals at home - follow up iron deficiency anemia with PCP - follow up with cardiology (moderate aortic stenosis) - also with chronic low back pain, knee pain (continue PT exercises; fall precautions reviewed)

## 2024-03-16 ENCOUNTER — Ambulatory Visit: Payer: Self-pay | Admitting: Cardiovascular Disease

## 2024-03-16 ENCOUNTER — Other Ambulatory Visit (HOSPITAL_BASED_OUTPATIENT_CLINIC_OR_DEPARTMENT_OTHER): Payer: Self-pay

## 2024-03-16 NOTE — Progress Notes (Signed)
 Ok

## 2024-03-19 ENCOUNTER — Other Ambulatory Visit (HOSPITAL_BASED_OUTPATIENT_CLINIC_OR_DEPARTMENT_OTHER): Payer: Self-pay

## 2024-03-25 NOTE — Progress Notes (Signed)
 [facility] Summary Report / Loop Recorder

## 2024-03-31 DIAGNOSIS — D508 Other iron deficiency anemias: Secondary | ICD-10-CM | POA: Diagnosis not present

## 2024-04-01 DIAGNOSIS — D508 Other iron deficiency anemias: Secondary | ICD-10-CM | POA: Diagnosis not present

## 2024-04-07 ENCOUNTER — Ambulatory Visit (INDEPENDENT_AMBULATORY_CARE_PROVIDER_SITE_OTHER)

## 2024-04-07 DIAGNOSIS — I5032 Chronic diastolic (congestive) heart failure: Secondary | ICD-10-CM | POA: Diagnosis not present

## 2024-04-07 LAB — CUP PACEART REMOTE DEVICE CHECK
Date Time Interrogation Session: 20250813232415
Implantable Pulse Generator Implant Date: 20230524

## 2024-04-09 ENCOUNTER — Other Ambulatory Visit (HOSPITAL_COMMUNITY): Payer: Self-pay | Admitting: Physician Assistant

## 2024-04-12 NOTE — Progress Notes (Unsigned)
  Electrophysiology Office Note:    Date:  04/13/2024   ID:  Jenna Mcdonald, Jenna Mcdonald [birth year], MRN [id number]  PCP:  Dwight Trula SQUIBB, MD   [facility] Providers Cardiologist:  Wilbert Bihari, MD Electrophysiologist:  Eulas FORBES Furbish, MD     Referring MD: Dwight Trula SQUIBB, MD   History of Present Illness:    Jenna Mcdonald is a 78 y.o. female with a hx listed below, significant for persistent atrial fibrillation, referred for arrhythmia management.  She has a long history of AF, maintained with flecainide  in the past.  She was hospitalized with persistent atrial fibrillation in 2019 after failure of flecainide .  She started on amiodarone .  She underwent ablation in 2020.  Amiodarone  was stopped due to GI distress.  In 2022 she was noted to have an irregular heart rhythm, but this appeared to be due to PVCs.  Later in 2022, she had admission for chest heaviness and was noted to have signs of heart failure with ventricular bigeminy on telemetry.    Medtronic Linq 2 loop recorder was placed in May 2023.  It did not shown any atrial fibrillation, so anticoagulation was discontinued.  A-fib was eventually detected in June 2024 and her anticoagulation resumed her PVC burden has been low.  She has complained of some left arm soreness since the device was implanted.       EKGs/Labs/Other Studies Reviewed Today:     Recent Labs: 03/03/2024: ALT 14; BUN 28; Creatinine, Ser 1.27; Hemoglobin 8.7; Platelets 182; Potassium 4.1; Sodium 138     Physical Exam:    VS:  BP (!) 120/58 (BP Location: Right Arm, Patient Position: Sitting, Cuff Size: Large)   Pulse 83   Ht 5' 4 (1.626 m)   Wt 207 lb (93.9 kg)   SpO2 98%   BMI 35.53 kg/m     Wt Readings from Last 3 Encounters:  04/13/24 207 lb (93.9 kg)  03/14/24 207 lb (93.9 kg)  03/03/24 203 lb (92.1 kg)     GEN: Well nourished, well developed in no acute distress CARDIAC: RRR, no murmurs, rubs, gallops The device  site is normal -- mild tenderness, edema, drainage, redness, threatened erosion. RESPIRATORY:  Normal work of breathing MUSCULOSKELETAL:  edema    ASSESSMENT & PLAN:    History of atrial fibrillation:  s/p ablation.  1 episode of atrial fibrillation in June 2024 with RVR --she was symptomatic and presented to the ER Anticoagulation was resumed She is concerned about left pectoral pain, but this is located well above and away from her loop recorder, and I think the 2 are not related   PVCs: burden monitored with loop recorder  Loop recorder in place: Medtronic Linq II functioning normally        Medication Adjustments/Labs and Tests Ordered: Current medicines are reviewed at length with the patient today.  Concerns regarding medicines are outlined above.  Orders Placed This Encounter  Procedures   EKG 12-Lead   No orders of the defined types were placed in this encounter.    Signed, Eulas FORBES Furbish, MD  04/13/2024 9:44 AM    [facility]

## 2024-04-13 ENCOUNTER — Encounter: Payer: Self-pay | Admitting: Cardiovascular Disease

## 2024-04-13 ENCOUNTER — Other Ambulatory Visit: Payer: Self-pay | Admitting: Cardiology

## 2024-04-13 ENCOUNTER — Ambulatory Visit: Attending: Cardiovascular Disease | Admitting: Cardiovascular Disease

## 2024-04-13 VITALS — BP 120/58 | HR 83 | Ht 64.0 in | Wt 207.0 lb

## 2024-04-13 DIAGNOSIS — I493 Ventricular premature depolarization: Secondary | ICD-10-CM | POA: Diagnosis not present

## 2024-04-13 DIAGNOSIS — I4819 Other persistent atrial fibrillation: Secondary | ICD-10-CM | POA: Diagnosis not present

## 2024-04-13 MED ORDER — APIXABAN 5 MG PO TABS: 5.0000 mg | ORAL_TABLET | Freq: Two times a day (BID) | ORAL | 1 refills | Status: DC

## 2024-04-13 NOTE — Telephone Encounter (Signed)
 Prescription refill request for Eliquis  received. Indication: AF Last office visit: 04/13/24  A Mealor MD Scr: 1.27 on 03/03/24  Epic Age: 78 Weight: 93.9kg  Based on above findings Eliquis  5mg  twice daily is the appropriate dose.  Refill approved.

## 2024-04-13 NOTE — Patient Instructions (Signed)
 Medication Instructions:  Your physician recommends that you continue on your current medications as directed. Please refer to the Current Medication list given to you today.  *If you need a refill on your cardiac medications before your next appointment, please call your pharmacy*  Lab Work: None ordered.  If you have labs (blood work) drawn today and your tests are completely normal, you will receive your results only by: MyChart Message (if you have MyChart) OR A paper copy in the mail If you have any lab test that is abnormal or we need to change your treatment, we will call you to review the results.  Testing/Procedures: None ordered.   Follow-Up: At [facility], you and your health needs are our priority.  As part of our continuing mission to provide you with exceptional heart care, our providers are all part of one team.  This team includes your primary Cardiologist (physician) and Advanced Practice Providers or APPs (Physician Assistants and Nurse Practitioners) who all work together to provide you with the care you need, when you need it.  Your next appointment:   Months with Dr Mealor

## 2024-04-21 DIAGNOSIS — M1711 Unilateral primary osteoarthritis, right knee: Secondary | ICD-10-CM | POA: Diagnosis not present

## 2024-04-22 ENCOUNTER — Encounter: Payer: Self-pay | Admitting: Gastroenterology

## 2024-04-28 DIAGNOSIS — M1711 Unilateral primary osteoarthritis, right knee: Secondary | ICD-10-CM | POA: Diagnosis not present

## 2024-05-03 ENCOUNTER — Ambulatory Visit: Admitting: Gastroenterology

## 2024-05-03 ENCOUNTER — Telehealth: Payer: Self-pay

## 2024-05-03 ENCOUNTER — Other Ambulatory Visit (INDEPENDENT_AMBULATORY_CARE_PROVIDER_SITE_OTHER)

## 2024-05-03 ENCOUNTER — Encounter: Payer: Self-pay | Admitting: Gastroenterology

## 2024-05-03 VITALS — BP 130/62 | HR 72 | Ht 64.0 in | Wt 208.0 lb

## 2024-05-03 DIAGNOSIS — I4891 Unspecified atrial fibrillation: Secondary | ICD-10-CM | POA: Diagnosis not present

## 2024-05-03 DIAGNOSIS — K5520 Angiodysplasia of colon without hemorrhage: Secondary | ICD-10-CM

## 2024-05-03 DIAGNOSIS — D563 Thalassemia minor: Secondary | ICD-10-CM | POA: Diagnosis not present

## 2024-05-03 DIAGNOSIS — D509 Iron deficiency anemia, unspecified: Secondary | ICD-10-CM | POA: Diagnosis not present

## 2024-05-03 DIAGNOSIS — Z7901 Long term (current) use of anticoagulants: Secondary | ICD-10-CM

## 2024-05-03 LAB — CBC WITH DIFFERENTIAL/PLATELET
Basophils Absolute: 0.1 K/uL (ref 0.0–0.1)
Basophils Relative: 1 % (ref 0.0–3.0)
Eosinophils Absolute: 0.1 K/uL (ref 0.0–0.7)
Eosinophils Relative: 1.7 % (ref 0.0–5.0)
HCT: 29 % — ABNORMAL LOW (ref 36.0–46.0)
Hemoglobin: 8.8 g/dL — ABNORMAL LOW (ref 12.0–15.0)
Lymphocytes Relative: 25.9 % (ref 12.0–46.0)
Lymphs Abs: 1.6 K/uL (ref 0.7–4.0)
MCHC: 30.4 g/dL (ref 30.0–36.0)
MCV: 56.6 fl — ABNORMAL LOW (ref 78.0–100.0)
Monocytes Absolute: 0.4 K/uL (ref 0.1–1.0)
Monocytes Relative: 6.8 % (ref 3.0–12.0)
Neutro Abs: 4 K/uL (ref 1.4–7.7)
Neutrophils Relative %: 64.6 % (ref 43.0–77.0)
Platelets: 184 K/uL (ref 150.0–400.0)
RBC: 5.12 Mil/uL — ABNORMAL HIGH (ref 3.87–5.11)
RDW: 21.1 % — ABNORMAL HIGH (ref 11.5–15.5)
WBC: 6.1 K/uL (ref 4.0–10.5)

## 2024-05-03 MED ORDER — NA SULFATE-K SULFATE-MG SULF 17.5-3.13-1.6 GM/177ML PO SOLN: 1.0000 | Freq: Once | ORAL | 0 refills | Status: AC

## 2024-05-03 NOTE — Patient Instructions (Signed)
 Your provider has requested that you go to the basement level for lab work before leaving today. Press B on the elevator. The lab is located at the first door on the left as you exit the elevator.  You have been scheduled for an endoscopy and colonoscopy. Please follow the written instructions given to you at your visit today.  If you use inhalers (even only as needed), please bring them with you on the day of your procedure.  DO NOT TAKE 7 DAYS PRIOR TO TEST- Trulicity (dulaglutide) Ozempic, Wegovy (semaglutide) Mounjaro  (tirzepatide ) Bydureon Bcise (exanatide extended release)  DO NOT TAKE 1 DAY PRIOR TO YOUR TEST Rybelsus (semaglutide) Adlyxin (lixisenatide) Victoza (liraglutide) Byetta (exanatide) ___________________________________________________________________________ _______________________________________________________  If your blood pressure at your visit was 140/90 or greater, please contact your primary care physician to follow up on this.  _______________________________________________________  If you are age 24 or older, your body mass index should be between 23-30. Your Body mass index is 35.7 kg/m. If this is out of the aforementioned range listed, please consider follow up with your Primary Care Provider.  If you are age 18 or younger, your body mass index should be between 19-25. Your Body mass index is 35.7 kg/m. If this is out of the aformentioned range listed, please consider follow up with your Primary Care Provider.   ________________________________________________________  The [facility] providers would like to encourage you to use MYCHART to communicate with providers for non-urgent requests or questions.  Due to long hold times on the telephone, sending your provider a message by [facility] may be a faster and more efficient way to get a response.  Please allow 48 business hours for a response.  Please remember that this is for non-urgent requests.   _______________________________________________________  [facility] is using a team-based approach to care.  Your team is made up of your doctor and two to three APPS. Our APPS (Nurse Practitioners and Physician Assistants) work with your physician to ensure care continuity for you. They are fully qualified to address your health concerns and develop a treatment plan. They communicate directly with your gastroenterologist to care for you. Seeing the Advanced Practice Practitioners on your physician's team can help you by facilitating care more promptly, often allowing for earlier appointments, access to diagnostic testing, procedures, and other specialty referrals.

## 2024-05-03 NOTE — Telephone Encounter (Signed)
 [facility] Pre-operative Risk Assessment     Request for surgical clearance:     Endoscopy Procedure  What type of surgery is being performed?     Egd/colon  When is this surgery scheduled?     06/14/2024  What type of clearance is required ?   Pharmacy  Are there any medications that need to be held prior to surgery and how long? Eliquis  - 2 days  Practice name and name of physician performing surgery?      [facility]  What is your office phone and fax number?      Phone- [phone]  Fax- [phone]  Anesthesia type (None, local, MAC, general) ?       MAC   Please route your response to [facility]

## 2024-05-03 NOTE — Progress Notes (Unsigned)
 HPI :        Past Medical History:  Diagnosis Date   Anemia    Aortic stenosis    mild by echo 09/2020 and 08/2022 with mean AVG   Breast CA (HCC)    left   Breast cancer (HCC)    Carotid stenosis    1-39% left   Degenerative disc disease, lumbar    of the knees   Diabetes mellitus    Dysrhythmia    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HCC)    Encounter for therapeutic drug monitoring 08/17/2018   Fatigue    Severe secondary to to gemfibrozil   Gastroesophageal reflux disease    denies   GI bleeding    a. 06/2018: EGD showed multiple nonbleeding duodenal ulcers. 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   Hammertoe    left second toe   Hypercholesteremia    Hypertension    Insomnia    Irritable bowel    Low back pain    Metabolic syndrome    Morbid obesity (HCC)    OSA on CPAP \   bipap   Osteoarthritis    Osteopenia    Persistent atrial fibrillation [facility])    s/p afib ablation - Dr. Kelsie 08/2018   Personal history of radiation therapy    Pneumonia    PONV (postoperative nausea and vomiting)    Sigmoid diverticulosis    Thalassemia minor    Unstable angina (HCC) 10/23/2020   Vitamin D  deficiency      Past Surgical History:  Procedure Laterality Date   ATRIAL FIBRILLATION ABLATION N/A 09/21/2018   Procedure: ATRIAL FIBRILLATION ABLATION;  Surgeon: Kelsie Agent, MD;  Location: [facility] INVASIVE CV LAB;  Service: Cardiovascular;  Laterality: N/A;   BIOPSY  07/06/2018   Procedure: BIOPSY;  Surgeon: Rosalie Kitchens, MD;  Location: [facility] ENDOSCOPY;  Service: Endoscopy;;   BREAST LUMPECTOMY Left 05/28/2009   BREAST SURGERY Left 2010   lumpectomy   CARDIOVERSION N/A 07/07/2018   Procedure: CARDIOVERSION;  Surgeon: Raford Riggs, MD;  Location: [facility] ENDOSCOPY;  Service: Cardiovascular;  Laterality: N/A;   CARDIOVERSION N/A 07/14/2018   Procedure: CARDIOVERSION;  Surgeon: Rolan Ezra RAMAN, MD;  Location: [facility]  ENDOSCOPY;  Service: Cardiovascular;  Laterality: N/A;   CESAREAN SECTION     COLONOSCOPY WITH PROPOFOL  N/A 07/06/2018   Procedure: COLONOSCOPY WITH PROPOFOL ;  Surgeon: Rosalie Kitchens, MD;  Location: [facility] ENDOSCOPY;  Service: Endoscopy;  Laterality: N/A;   CYSTECTOMY  1970   pilonidal    DILATION AND CURETTAGE OF UTERUS     ESOPHAGOGASTRODUODENOSCOPY (EGD) WITH PROPOFOL  N/A 07/06/2018   Procedure: ESOPHAGOGASTRODUODENOSCOPY (EGD) WITH PROPOFOL ;  Surgeon: Rosalie Kitchens, MD;  Location: [facility] ENDOSCOPY;  Service: Endoscopy;  Laterality: N/A;   EYE SURGERY Bilateral 07/2013   cataracts   HOT HEMOSTASIS N/A 07/06/2018   Procedure: HOT HEMOSTASIS (ARGON PLASMA COAGULATION/BICAP);  Surgeon: Rosalie Kitchens, MD;  Location: [facility] ENDOSCOPY;  Service: Endoscopy;  Laterality: N/A;   implantable loop recorder placement  01/15/2022   Medtronic Reveal Linq model [id number] implantable loop recorder (SN [id number]) implanted for AF management by Dr Kelsie   MAXIMUM ACCESS (MAS)POSTERIOR LUMBAR INTERBODY FUSION (PLIF) 2 LEVEL N/A 09/22/2013   Procedure: FOR MAXIMUM ACCESS  POSTERIOR LUMBAR INTERBODY FUSION LUMBAR THREE-FOUR FOUR-FIVE;  Surgeon: Alm RAMAN Molt, MD;  Location: [facility] NEURO ORS;  Service: Neurosurgery;  Laterality: N/A;   OOPHORECTOMY     wedge section not rem   REPLACEMENT TOTAL KNEE Left 2011  REPLACEMENT TOTAL KNEE     RIGHT/LEFT HEART CATH AND CORONARY ANGIOGRAPHY N/A 10/26/2020   Procedure: RIGHT/LEFT HEART CATH AND CORONARY ANGIOGRAPHY;  Surgeon: Anner Alm ORN, MD;  Location: [facility] INVASIVE CV LAB;  Service: Cardiovascular;  Laterality: N/A;   TEE WITHOUT CARDIOVERSION N/A 07/07/2018   Procedure: TRANSESOPHAGEAL ECHOCARDIOGRAM (TEE);  Surgeon: Raford Riggs, MD;  Location: [facility] ENDOSCOPY;  Service: Cardiovascular;  Laterality: N/A;   TEE WITHOUT CARDIOVERSION N/A 09/21/2018   Procedure: TRANSESOPHAGEAL ECHOCARDIOGRAM (TEE);  Surgeon: Alveta Aleene PARAS, MD;  Location: [facility] ENDOSCOPY;  Service: Cardiovascular;   Laterality: N/A;   TONSILLECTOMY     TUBAL LIGATION     Family History  Problem Relation Age of Onset   Anesthesia problems Mother    Diabetes Mother    Diabetes Father    Colon cancer Neg Hx    Esophageal cancer Neg Hx    Stomach cancer Neg Hx    Social History   Tobacco Use   Smoking status: Former    Current packs/day: 0.00    Average packs/day: 0.5 packs/day for 10.0 years (5.0 ttl pk-yrs)    Types: Cigarettes    Start date: 11/11/1993    Quit date: 11/12/2003    Years since quitting: 20.4   Smokeless tobacco: Never   Tobacco comments:    Former smoker 05/13/23  Vaping Use   Vaping status: Never Used  Substance Use Topics   Alcohol use: Yes    Comment: occasionally   Drug use: No   Current Outpatient Medications  Medication Sig Dispense Refill   apixaban  (ELIQUIS ) 5 MG TABS tablet Take 1 tablet (5 mg total) by mouth 2 (two) times daily. 180 tablet 1   bismuth subsalicylate (PEPTO BISMOL) 262 MG chewable tablet Chew 262-524 mg by mouth 3 (three) times daily as needed for indigestion.     Calcium  Carbonate-Vitamin D  (CALCIUM  600 + D PO) Take 1 tablet by mouth daily.      Cholecalciferol (VITAMIN D3) 25 MCG (1000 UT) CAPS Take 1,000 Units by mouth every morning.     diltiazem  (CARDIZEM  CD) 120 MG 24 hr capsule Take 1 capsule (120 mg total) by mouth daily. 90 capsule 2   diphenhydramine -acetaminophen  (TYLENOL  PM) 25-500 MG TABS tablet Take 2 tablets by mouth at bedtime as needed (sleep).     ferrous sulfate 325 (65 FE) MG EC tablet Take 325 mg by mouth 3 (three) times daily with meals. (Patient taking differently: Take 325 mg by mouth 3 (three) times daily with meals. Every other day)     Magnesium  Glycinate 100 MG CAPS Take 200 mg by mouth at bedtime as needed (for muscle cramps). Can increase to 400 mg if needed. (Patient taking differently: Take 200 mg by mouth at bedtime as needed (for muscle cramps). Can increase to 400 mg if needed. Taking twice a day)     ONETOUCH  ULTRA test strip 1 each by Other route daily.     tirzepatide  (MOUNJARO ) 7.5 MG/0.5ML Pen Inject 7.5 mg into the skin once a week. 6 mL 1   zolpidem  (AMBIEN ) 5 MG tablet Take 5 mg by mouth at bedtime as needed for sleep.     No current facility-administered medications for this visit.   Allergies  Allergen Reactions   Metoprolol  Shortness Of Breath, Diarrhea and Other (See Comments)    Fatigue    Amiodarone  Other (See Comments)    Reaction not recalled   Anastrozole Other (See Comments)    Fatigue   Calcitriol  Other (See Comments)    Reaction not noted   Furosemide  Other (See Comments)    Cramps   Hydrocodone -Acetaminophen  Nausea And Vomiting and Other (See Comments)    Patient reports terrible nausea/GI upset; plain Tylenol  is ok with patient   Jardiance  [Empagliflozin ] Other (See Comments)    Severe muscle and bone pain   Pravastatin  Other (See Comments)   Tamoxifen Other (See Comments)    Bone and body aches    Tetracyclines & Related Other (See Comments)    Vaginal infections     Toprol  Xl [Metoprolol  Tartrate] Other (See Comments)    Hair loss   Verapamil  Other (See Comments)    Unknown   Vicodin [Hydrocodone -Acetaminophen ] Nausea And Vomiting and Other (See Comments)    Patient reports terrible nausea; plain tylenol  is ok with patient   Aromasin [Exemestane] Other (See Comments)    Hair loss    Gemfibrozil Other (See Comments)    Severe fatigue   Other Other (See Comments)    Numerous cancer drugs - hair loss and/or could not tolerate     Review of Systems: All systems reviewed and negative except where noted in HPI.    CUP PACEART REMOTE DEVICE CHECK Result Date: 04/07/2024 ILR summary report received. Battery status OK. Normal device function. No new symptom, tachy, brady, or pause episodes. No new AF episodes. PAC ectopy noted on presenting EGM. Monthly summary reports and ROV/PRN. [facility], [facility]   Physical Exam: BP 130/62   Pulse 72   Ht 5' 4 (1.626 m)    Wt 208 lb (94.3 kg)   SpO2 97%   BMI 35.70 kg/m  Constitutional: Pleasant,well-developed, ***female in no acute distress. HEENT: Normocephalic and atraumatic. Conjunctivae are normal. No scleral icterus. Neck supple.  Cardiovascular: Normal rate, regular rhythm.  Pulmonary/chest: Effort normal and breath sounds normal. No wheezing, rales or rhonchi. Abdominal: Soft, nondistended, nontender. Bowel sounds active throughout. There are no masses palpable. No hepatomegaly. Extremities: no edema Lymphadenopathy: No cervical adenopathy noted. Neurological: Alert and oriented to person place and time. Skin: Skin is warm and dry. No rashes noted. Psychiatric: Normal mood and affect. Behavior is normal.  CBC    Component Value Date/Time   WBC 6.8 03/03/2024 1208   RBC 5.01 03/03/2024 1335   RBC 5.02 03/03/2024 1208   HGB 8.7 (L) 03/03/2024 1208   HGB 10.8 (L) 09/16/2021 0904   HGB 11.5 (L) 07/18/2014 1445   HCT 30.3 (L) 03/03/2024 1208   HCT 36.5 09/16/2021 0904   HCT 38.3 07/18/2014 1445   PLT 182 03/03/2024 1208   PLT 223 09/16/2021 0904   MCV 60.4 (L) 03/03/2024 1208   MCV 62 (L) 09/16/2021 0904   MCV 61.9 (L) 07/18/2014 1445   MCH 17.3 (L) 03/03/2024 1208   MCHC 28.7 (L) 03/03/2024 1208   RDW 17.4 (H) 03/03/2024 1208   RDW 18.9 (H) 09/16/2021 0904   RDW 15.7 (H) 07/18/2014 1445   LYMPHSABS 1.3 03/03/2024 1208   LYMPHSABS 2.1 07/18/2014 1445   MONOABS 0.4 03/03/2024 1208   MONOABS 0.5 07/18/2014 1445   EOSABS 0.1 03/03/2024 1208   EOSABS 0.1 07/18/2014 1445   BASOSABS 0.1 03/03/2024 1208   BASOSABS 0.1 07/18/2014 1445    CMP     Component Value Date/Time   NA 138 03/03/2024 1208   NA 139 07/29/2021 0000   NA 143 07/18/2014 1446   K 4.1 03/03/2024 1208   K 4.3 07/18/2014 1446   CL 106 03/03/2024 1208  CL 106 05/11/2012 1632   CO2 23 03/03/2024 1208   CO2 27 07/18/2014 1446   GLUCOSE 180 (H) 03/03/2024 1208   GLUCOSE 183 (H) 07/18/2014 1446   GLUCOSE 218 (H)  05/11/2012 1632   BUN 28 (H) 03/03/2024 1208   BUN 23 07/29/2021 0000   BUN 15.9 07/18/2014 1446   CREATININE 1.27 (H) 03/03/2024 1208   CREATININE 0.96 09/20/2015 0922   CREATININE 1.0 07/18/2014 1446   CALCIUM  9.3 03/03/2024 1208   CALCIUM  10.3 07/18/2014 1446   PROT 6.9 03/03/2024 1208   PROT 7.0 11/16/2020 0831   PROT 7.4 07/18/2014 1446   ALBUMIN  3.9 03/03/2024 1208   ALBUMIN  4.4 11/16/2020 0831   ALBUMIN  4.1 07/18/2014 1446   AST 19 03/03/2024 1208   AST 22 07/18/2014 1446   ALT 14 03/03/2024 1208   ALT 38 07/18/2014 1446   ALKPHOS 74 03/03/2024 1208   ALKPHOS 105 07/18/2014 1446   BILITOT 0.7 03/03/2024 1208   BILITOT 0.6 11/16/2020 0831   BILITOT 0.52 07/18/2014 1446   GFRNONAA 43 (L) 03/03/2024 1208   GFRAA 59 (L) 09/21/2018 0746       Latest Ref Rng & Units 03/03/2024    1:35 PM 03/03/2024   12:08 PM 01/25/2023    6:07 PM  CBC EXTENDED  WBC 4.0 - 10.5 K/uL  6.8  7.1   RBC 3.87 - 5.11 MIL/uL 5.01  5.02  6.00   Hemoglobin 12.0 - 15.0 g/dL  8.7  88.7   HCT 63.9 - 46.0 %  30.3  38.0   Platelets 150 - 400 K/uL  182  180   NEUT# 1.7 - 7.7 K/uL  4.9  5.3   Lymph# 0.7 - 4.0 K/uL  1.3  1.2       ASSESSMENT AND PLAN:  Dwight Trula SQUIBB, MD

## 2024-05-03 NOTE — Telephone Encounter (Signed)
 Patient with diagnosis of atrial fibrillation on Eliquis  for anticoagulation.    What type of surgery is being performed?     Egd/colon  When is this surgery scheduled?     06/14/2024    CHA2DS2-VASc Score = 5   This indicates a 7.2% annual risk of stroke. The patient's score is based upon: CHF History: 0 HTN History: 1 Diabetes History: 1 Stroke History: 0 Vascular Disease History: 0 Age Score: 2 Gender Score: 1    CrCl 54  Platelet count 182  Patient has not had an Afib/aflutter ablation or Watchman within the last 3 months or DCCV within the last 30 days   Per office protocol, patient can hold Eliquis  for 2 days prior to procedure.   Patient will not need bridging with Lovenox (enoxaparin) around procedure.  **This guidance is not considered finalized until pre-operative APP has relayed final recommendations.**

## 2024-05-04 ENCOUNTER — Telehealth: Payer: Self-pay | Admitting: Gastroenterology

## 2024-05-04 DIAGNOSIS — E781 Pure hyperglyceridemia: Secondary | ICD-10-CM | POA: Diagnosis not present

## 2024-05-04 DIAGNOSIS — E1122 Type 2 diabetes mellitus with diabetic chronic kidney disease: Secondary | ICD-10-CM | POA: Diagnosis not present

## 2024-05-04 DIAGNOSIS — Z1331 Encounter for screening for depression: Secondary | ICD-10-CM | POA: Diagnosis not present

## 2024-05-04 DIAGNOSIS — Z23 Encounter for immunization: Secondary | ICD-10-CM | POA: Diagnosis not present

## 2024-05-04 DIAGNOSIS — I5032 Chronic diastolic (congestive) heart failure: Secondary | ICD-10-CM | POA: Diagnosis not present

## 2024-05-04 DIAGNOSIS — D508 Other iron deficiency anemias: Secondary | ICD-10-CM | POA: Diagnosis not present

## 2024-05-04 DIAGNOSIS — Z0000 Encounter for general adult medical examination without abnormal findings: Secondary | ICD-10-CM | POA: Diagnosis not present

## 2024-05-04 DIAGNOSIS — I480 Paroxysmal atrial fibrillation: Secondary | ICD-10-CM | POA: Diagnosis not present

## 2024-05-04 DIAGNOSIS — E1169 Type 2 diabetes mellitus with other specified complication: Secondary | ICD-10-CM | POA: Diagnosis not present

## 2024-05-04 DIAGNOSIS — N1831 Chronic kidney disease, stage 3a: Secondary | ICD-10-CM | POA: Diagnosis not present

## 2024-05-04 NOTE — Telephone Encounter (Signed)
 Patient called requesting a call from Sonny to discuss if there is a cancellation list she can be added to for 10/21 colonoscopy. Please advise, thank you.

## 2024-05-04 NOTE — Telephone Encounter (Signed)
Notes faxed to GI office

## 2024-05-04 NOTE — Telephone Encounter (Signed)
 I have already  heard back from [facility] and they said she can hold Eliquis  for 2 days.  I am going to wait to contact her until you have gotten her labs back and possibly contacted them yourself.  Just let me know what the plan is.  Thanks!

## 2024-05-04 NOTE — Telephone Encounter (Signed)
   Patient Name: Jenna Mcdonald  DOB: March 08, 1946 MRN: [id number]  Primary Cardiologist: Wilbert Bihari, MD  Clinical pharmacists have reviewed the patient's past medical history, labs, and current medications as part of preoperative protocol coverage. The following recommendations have been made:  What type of surgery is being performed?     Egd/colon  When is this surgery scheduled?     06/14/2024      CHA2DS2-VASc Score = 5   This indicates a 7.2% annual risk of stroke. The patient's score is based upon: CHF History: 0 HTN History: 1 Diabetes History: 1 Stroke History: 0 Vascular Disease History: 0 Age Score: 2 Gender Score: 1     CrCl 54             Platelet count 182   Patient has not had an Afib/aflutter ablation or Watchman within the last 3 months or DCCV within the last 30 days    Per office protocol, patient can hold Eliquis  for 2 days prior to procedure.   Patient will not need bridging with Lovenox (enoxaparin) around procedure.   I will route this recommendation to the requesting party via Epic fax function and remove from pre-op pool.  Please call with questions.  Lamarr Satterfield, NP 05/04/2024, 7:47 AM

## 2024-05-05 ENCOUNTER — Telehealth: Payer: Self-pay | Admitting: Cardiology

## 2024-05-05 ENCOUNTER — Other Ambulatory Visit: Payer: Self-pay | Admitting: Internal Medicine

## 2024-05-05 ENCOUNTER — Other Ambulatory Visit: Payer: Self-pay

## 2024-05-05 DIAGNOSIS — Z0000 Encounter for general adult medical examination without abnormal findings: Secondary | ICD-10-CM

## 2024-05-05 DIAGNOSIS — M1711 Unilateral primary osteoarthritis, right knee: Secondary | ICD-10-CM | POA: Diagnosis not present

## 2024-05-05 MED ORDER — APIXABAN 5 MG PO TABS: 5.0000 mg | ORAL_TABLET | Freq: Two times a day (BID) | ORAL | 1 refills | Status: DC

## 2024-05-05 MED ORDER — APIXABAN 5 MG PO TABS: 5.0000 mg | ORAL_TABLET | Freq: Two times a day (BID) | ORAL | 1 refills | Status: AC

## 2024-05-05 NOTE — Telephone Encounter (Signed)
 Prescription refill request for Eliquis  received. Indication:afib Last office visit:8/25 Scr:1.27  7/25 Age: 78 Weight:94.3  kg  Prescription refilled

## 2024-05-05 NOTE — Telephone Encounter (Signed)
*  STAT* If patient is at the pharmacy, call can be transferred to refill team.   1. Which medications need to be refilled? (please list name of each medication and dose if known)   apixaban  (ELIQUIS ) 5 MG TABS tablet   2. Would you like to learn more about the convenience, safety, & potential cost savings by using the [facility]?   3. Are you open to using the [facility] (Type [facility]. ).  4. Which pharmacy/location (including street and city if local pharmacy) is medication to be sent to?  [id number] - [place], [place] - [address]   5. Do they need a 30 day or 90 day supply?   90 day  Patient stated she still has medication.

## 2024-05-05 NOTE — Telephone Encounter (Signed)
 Spoke to patient and told her that I would keep an eye out on the September hospital scheduled to watch for cancellations.  I also told her that Per Dr. Stacia, her hgb was stable and she could stay on her Eliquis  and hold it for 2 days prior to her procedure.  Patient agreed.    Dr. Stacia - if for some reason you want me to find her a spot with someone else that is sooner let me know, but she did seem to feel more positive after we spoke.

## 2024-05-05 NOTE — Telephone Encounter (Signed)
 Prescription refill request for Eliquis  received. Indication: afib  Last office visit: Mealor 04/13/2024 Scr: 1.27, 03/03/2024 Age: 78 yo  Weight: 94.3 kg   Refill sent.

## 2024-05-05 NOTE — Telephone Encounter (Signed)
 Spoke with patient and told her that per cardiology she could hold her Eliquis  for 2 days prior to her procedure.  Patient agreed.

## 2024-05-05 NOTE — Telephone Encounter (Signed)
 PT wants to be on a cancellation list with hospital. She stated she is bleeding and needs this done earlier. Please advise.

## 2024-05-07 ENCOUNTER — Ambulatory Visit: Payer: Self-pay | Admitting: Gastroenterology

## 2024-05-07 NOTE — Progress Notes (Signed)
 Ms. Behl,  Your hemoglobin was stable compared to July, indicating no significant blood loss.  Please continue your Eliquis  and plan for EGD and colonoscopy in the hospital in October as planned (remember to hold your Eliquis  for 2 days for your procedures).

## 2024-05-09 ENCOUNTER — Ambulatory Visit (INDEPENDENT_AMBULATORY_CARE_PROVIDER_SITE_OTHER)

## 2024-05-09 DIAGNOSIS — I5032 Chronic diastolic (congestive) heart failure: Secondary | ICD-10-CM

## 2024-05-09 NOTE — Telephone Encounter (Signed)
Will continue to watch for cancellations.

## 2024-05-10 LAB — CUP PACEART REMOTE DEVICE CHECK
Date Time Interrogation Session: 20250913231003
Implantable Pulse Generator Implant Date: 20230524

## 2024-05-14 ENCOUNTER — Ambulatory Visit: Payer: Self-pay | Admitting: Cardiovascular Disease

## 2024-05-16 NOTE — Progress Notes (Signed)
 Remote Loop Recorder Transmission

## 2024-05-18 DIAGNOSIS — H35373 Puckering of macula, bilateral: Secondary | ICD-10-CM | POA: Diagnosis not present

## 2024-05-18 NOTE — Progress Notes (Signed)
 Remote Loop Recorder Transmission

## 2024-05-24 DIAGNOSIS — H538 Other visual disturbances: Secondary | ICD-10-CM | POA: Diagnosis not present

## 2024-05-24 DIAGNOSIS — Z961 Presence of intraocular lens: Secondary | ICD-10-CM | POA: Diagnosis not present

## 2024-05-24 DIAGNOSIS — H04123 Dry eye syndrome of bilateral lacrimal glands: Secondary | ICD-10-CM | POA: Diagnosis not present

## 2024-05-30 DIAGNOSIS — G4733 Obstructive sleep apnea (adult) (pediatric): Secondary | ICD-10-CM | POA: Diagnosis not present

## 2024-06-02 NOTE — Progress Notes (Signed)
 Remote Loop Recorder Transmission

## 2024-06-06 ENCOUNTER — Telehealth: Payer: Self-pay | Admitting: Gastroenterology

## 2024-06-06 NOTE — Telephone Encounter (Signed)
 Returned patient call. Discussed diet prior to colonoscopy and strategies to help keep blood sugar from dropping too low, ncluding skipping sugar-free liquids. Patient also confirmed she knows to hold Mounjaro  one week prior.

## 2024-06-06 NOTE — Telephone Encounter (Signed)
 Inbound call from patient stating she would like to speak to nurse in regards to upcoming procedure at  [facility] on 06/14/24. Patient stating she's a diabetic and will be without eating for a day and a half, patients afraid she'll pass out due to sugar dropping  Requesting a call back  Please advise  Thank you

## 2024-06-08 ENCOUNTER — Other Ambulatory Visit (HOSPITAL_BASED_OUTPATIENT_CLINIC_OR_DEPARTMENT_OTHER): Payer: Self-pay

## 2024-06-08 ENCOUNTER — Encounter (HOSPITAL_COMMUNITY): Payer: Self-pay | Admitting: Gastroenterology

## 2024-06-09 ENCOUNTER — Telehealth: Payer: Self-pay

## 2024-06-09 ENCOUNTER — Encounter

## 2024-06-09 DIAGNOSIS — D508 Other iron deficiency anemias: Secondary | ICD-10-CM | POA: Diagnosis not present

## 2024-06-09 NOTE — Telephone Encounter (Signed)
 Procedure:COLON Procedure date: 06/14/24 Procedure location: [facility] Arrival Time: 10:59 Spoke with the patient Y/N: N Any prep concerns? N  Has the patient obtained the prep from the pharmacy ? N Do you have a care partner and transportation: N Any additional concerns? N   I called patient on 3 different occasions. I left a detailed message  and the office number just in case patient had questions are concerns.

## 2024-06-10 ENCOUNTER — Ambulatory Visit

## 2024-06-10 DIAGNOSIS — I5032 Chronic diastolic (congestive) heart failure: Secondary | ICD-10-CM | POA: Diagnosis not present

## 2024-06-10 NOTE — Telephone Encounter (Signed)
 Damien Music, RN successful in contacting the patient. Questions regarding prep answered. Procedure date confirmed. See telephone encounter 06/06/24.

## 2024-06-12 LAB — CUP PACEART REMOTE DEVICE CHECK
Date Time Interrogation Session: 20251016232050
Implantable Pulse Generator Implant Date: 20230524

## 2024-06-13 ENCOUNTER — Telehealth: Payer: Self-pay | Admitting: Gastroenterology

## 2024-06-13 NOTE — Telephone Encounter (Signed)
 Attempted to reach patient on home number. No answer, no voicemail.  Attempted to reach patient on mobile number. No answer. Left a voicemail to call back.

## 2024-06-13 NOTE — Telephone Encounter (Signed)
 Inbound call from patient stating she would like to speak to nurse in regards to prep instructions for tomorrows procedure at [facility].   Requesting a call back  Please advise  Thank you

## 2024-06-14 ENCOUNTER — Encounter (HOSPITAL_COMMUNITY)
Admission: RE | Disposition: A | Discharge status: Home / Self Care | Payer: Self-pay | Source: Home / Self Care | Attending: Gastroenterology

## 2024-06-14 ENCOUNTER — Ambulatory Visit (HOSPITAL_COMMUNITY): Admitting: Certified Registered Nurse Anesthetist

## 2024-06-14 ENCOUNTER — Ambulatory Visit (HOSPITAL_COMMUNITY)
Admission: RE | Admit: 2024-06-14 | Discharge: 2024-06-14 | Disposition: A | Discharge status: Home / Self Care | Attending: Gastroenterology | Admitting: Gastroenterology

## 2024-06-14 ENCOUNTER — Other Ambulatory Visit: Payer: Self-pay

## 2024-06-14 DIAGNOSIS — Z87891 Personal history of nicotine dependence: Secondary | ICD-10-CM

## 2024-06-14 DIAGNOSIS — D125 Benign neoplasm of sigmoid colon: Secondary | ICD-10-CM | POA: Diagnosis not present

## 2024-06-14 DIAGNOSIS — Q439 Congenital malformation of intestine, unspecified: Secondary | ICD-10-CM | POA: Diagnosis not present

## 2024-06-14 DIAGNOSIS — I10 Essential (primary) hypertension: Secondary | ICD-10-CM | POA: Diagnosis not present

## 2024-06-14 DIAGNOSIS — K219 Gastro-esophageal reflux disease without esophagitis: Secondary | ICD-10-CM | POA: Diagnosis not present

## 2024-06-14 DIAGNOSIS — I6529 Occlusion and stenosis of unspecified carotid artery: Secondary | ICD-10-CM | POA: Insufficient documentation

## 2024-06-14 DIAGNOSIS — K3189 Other diseases of stomach and duodenum: Secondary | ICD-10-CM | POA: Insufficient documentation

## 2024-06-14 DIAGNOSIS — M1990 Unspecified osteoarthritis, unspecified site: Secondary | ICD-10-CM | POA: Diagnosis not present

## 2024-06-14 DIAGNOSIS — D509 Iron deficiency anemia, unspecified: Secondary | ICD-10-CM

## 2024-06-14 DIAGNOSIS — Z6835 Body mass index (BMI) 35.0-35.9, adult: Secondary | ICD-10-CM | POA: Diagnosis not present

## 2024-06-14 DIAGNOSIS — K5730 Diverticulosis of large intestine without perforation or abscess without bleeding: Secondary | ICD-10-CM

## 2024-06-14 DIAGNOSIS — K2990 Gastroduodenitis, unspecified, without bleeding: Secondary | ICD-10-CM | POA: Diagnosis not present

## 2024-06-14 DIAGNOSIS — I4891 Unspecified atrial fibrillation: Secondary | ICD-10-CM | POA: Insufficient documentation

## 2024-06-14 DIAGNOSIS — K269 Duodenal ulcer, unspecified as acute or chronic, without hemorrhage or perforation: Secondary | ICD-10-CM

## 2024-06-14 DIAGNOSIS — D500 Iron deficiency anemia secondary to blood loss (chronic): Secondary | ICD-10-CM | POA: Insufficient documentation

## 2024-06-14 DIAGNOSIS — K5520 Angiodysplasia of colon without hemorrhage: Secondary | ICD-10-CM | POA: Diagnosis not present

## 2024-06-14 DIAGNOSIS — G4733 Obstructive sleep apnea (adult) (pediatric): Secondary | ICD-10-CM | POA: Insufficient documentation

## 2024-06-14 DIAGNOSIS — E119 Type 2 diabetes mellitus without complications: Secondary | ICD-10-CM | POA: Insufficient documentation

## 2024-06-14 DIAGNOSIS — K2980 Duodenitis without bleeding: Secondary | ICD-10-CM | POA: Diagnosis not present

## 2024-06-14 DIAGNOSIS — K2950 Unspecified chronic gastritis without bleeding: Secondary | ICD-10-CM | POA: Diagnosis not present

## 2024-06-14 HISTORY — PX: COLONOSCOPY: SHX5424

## 2024-06-14 HISTORY — PX: ESOPHAGOGASTRODUODENOSCOPY: SHX5428

## 2024-06-14 LAB — GLUCOSE, CAPILLARY: Glucose-Capillary: 102 mg/dL — ABNORMAL HIGH (ref 70–99)

## 2024-06-14 SURGERY — COLONOSCOPY
Anesthesia: Monitor Anesthesia Care

## 2024-06-14 MED ORDER — SODIUM CHLORIDE 0.9 % IV SOLN
INTRAVENOUS | Status: AC | PRN
Start: 2024-06-14 — End: 2024-06-14
  Administered 2024-06-14: 500 mL via INTRAMUSCULAR

## 2024-06-14 MED ORDER — PROPOFOL 10 MG/ML IV BOLUS
INTRAVENOUS | Status: DC | PRN
  Administered 2024-06-14 (×2): 20 mg via INTRAVENOUS

## 2024-06-14 MED ORDER — PHENYLEPHRINE 80 MCG/ML (10ML) SYRINGE FOR IV PUSH (FOR BLOOD PRESSURE SUPPORT)
PREFILLED_SYRINGE | INTRAVENOUS | Status: DC | PRN
  Administered 2024-06-14: 80 ug via INTRAVENOUS

## 2024-06-14 MED ORDER — PROPOFOL 500 MG/50ML IV EMUL
INTRAVENOUS | Status: DC | PRN
Start: 2024-06-14 — End: 2024-06-14
  Administered 2024-06-14: 180 ug/kg/min via INTRAVENOUS

## 2024-06-14 MED ORDER — LIDOCAINE 2% (20 MG/ML) 5 ML SYRINGE
INTRAMUSCULAR | Status: DC | PRN
  Administered 2024-06-14: 40 mg via INTRAVENOUS

## 2024-06-14 MED ORDER — SODIUM CHLORIDE 0.9 % IV SOLN: INTRAVENOUS | Status: DC

## 2024-06-14 NOTE — Anesthesia Postprocedure Evaluation (Signed)
 Anesthesia Post Note  Patient: Meri Pelot  Procedure(s) Performed: COLONOSCOPY EGD (ESOPHAGOGASTRODUODENOSCOPY)     Patient location during evaluation: PACU Anesthesia Type: MAC Level of consciousness: awake and alert Pain management: pain level controlled Vital Signs Assessment: post-procedure vital signs reviewed and stable Respiratory status: spontaneous breathing, nonlabored ventilation, respiratory function stable and patient connected to nasal cannula oxygen  Cardiovascular status: stable and blood pressure returned to baseline Postop Assessment: no apparent nausea or vomiting Anesthetic complications: no   No notable events documented.  Last Vitals:  Vitals:   06/14/24 1340 06/14/24 1350  BP: 134/62 (!) 149/61  Pulse: 71 67  Resp: 12 13  Temp:    SpO2: 100% 100%    Last Pain:  Vitals:   06/14/24 1350  TempSrc:   PainSc: 0-No pain                 Thom JONELLE Peoples

## 2024-06-14 NOTE — Op Note (Signed)
 [facility] Patient Name: Jenna Mcdonald Procedure Date: 06/14/2024 MRN: [id number] Attending MD: Glendia BRAVO. Stacia , MD, [id number] Date of Birth: 1945/12/09 CSN: [id number] Age: 78 Admit Type: Outpatient Procedure:                Upper GI endoscopy Indications:              Iron deficiency anemia secondary to chronic blood                            loss Providers:                Glendia E. Stacia, MD, Randall Lines, RN, [facility]                            Petiford, Technician, Leighton Agent, CRNA Referring MD:             Glendia BRAVO. Stacia, MD Medicines:                Monitored Anesthesia Care Complications:            No immediate complications. Estimated Blood Loss:     Estimated blood loss was minimal. Procedure:                Pre-Anesthesia Assessment:                           - Prior to the procedure, a History and Physical                            was performed, and patient medications and                            allergies were reviewed. The patient's tolerance of                            previous anesthesia was also reviewed. The risks                            and benefits of the procedure and the sedation                            options and risks were discussed with the patient.                            All questions were answered, and informed consent                            was obtained. Prior Anticoagulants: The patient has                            taken Eliquis  (apixaban ), last dose was 3 days                            prior to procedure. ASA Grade Assessment: III - A  patient with severe systemic disease. After                            reviewing the risks and benefits, the patient was                            deemed in satisfactory condition to undergo the                            procedure.                           After obtaining informed consent, the endoscope was                             passed under direct vision. Throughout the                            procedure, the patient's blood pressure, pulse, and                            oxygen  saturations were monitored continuously. The                            [id number]-[id number] ([id number]) Olympus endoscope was introduced                            through the mouth, and advanced to the second part                            of duodenum. The upper GI endoscopy was                            accomplished without difficulty. The patient                            tolerated the procedure well. Scope In: Scope Out: Findings:      The examined esophagus was normal.      A few dispersed diminutive erosions with no bleeding and no stigmata of       recent bleeding were found in the gastric body. Biopsies were taken with       a cold forceps for Helicobacter pylori testing.      The exam of the stomach was otherwise normal.      Multiple diffuse erosions without bleeding were found in the duodenal       bulb and in the second portion of the duodenum. Biopsies were taken with       a cold forceps for histology. Estimated blood loss was minimal.      The exam of the duodenum was otherwise normal.      The examined portions of the nasopharynx, oropharynx and larynx were       normal. Impression:               - Normal esophagus.                           -  Erosive gastropathy with no bleeding and no                            stigmata of recent bleeding. Biopsied.                           - Duodenal erosions without bleeding. Biopsied.                           - The examined portions of the nasopharynx,                            oropharynx and larynx were normal.                           - Unclear if these findings would explain patient's                            iron deficiency anemia. Moderate Sedation:      Not Applicable - Patient had care per Anesthesia. Recommendation:           - Patient has a contact number available for                             emergencies. The signs and symptoms of potential                            delayed complications were discussed with the                            patient. Return to normal activities tomorrow.                            Written discharge instructions were provided to the                            patient.                           - Resume previous diet.                           - Resume Eliquis  (apixaban ) at prior dose in 3 days.                           - Await pathology results.                           - Proceed with colonoscopy.                           - Avoid NSAIDs. Procedure Code(s):        --- Professional ---                           [phone], Esophagogastroduodenoscopy, flexible,  transoral; with biopsy, single or multiple Diagnosis Code(s):        --- Professional ---                           K31.89, Other diseases of stomach and duodenum                           K26.9, Duodenal ulcer, unspecified as acute or                            chronic, without hemorrhage or perforation                           D50.0, Iron deficiency anemia secondary to blood                            loss (chronic) CPT copyright 2022 American [employer]. All rights reserved. The codes documented in this report are preliminary and upon coder review may  be revised to meet current compliance requirements. Andreas Sobolewski E. Stacia, MD 06/14/2024 1:34:02 PM This report has been signed electronically. Number of Addenda: 0

## 2024-06-14 NOTE — Op Note (Signed)
 [facility] Patient Name: Jenna Mcdonald Procedure Date: 06/14/2024 MRN: [id number] Attending MD: Glendia BRAVO. Stacia , MD, [id number] Date of Birth: 10/29/1945 CSN: [id number] Age: 78 Admit Type: Outpatient Procedure:                Colonoscopy Indications:              Iron deficiency anemia secondary to chronic blood                            loss Providers:                Glendia E. Stacia, MD, Randall Lines, RN, [facility]                            Petiford, Technician, Leighton Agent, CRNA Referring MD:             Glendia BRAVO. Stacia, MD Medicines:                Monitored Anesthesia Care Complications:            No immediate complications. Estimated Blood Loss:     Estimated blood loss was minimal. Procedure:                Pre-Anesthesia Assessment:                           - Prior to the procedure, a History and Physical                            was performed, and patient medications and                            allergies were reviewed. The patient's tolerance of                            previous anesthesia was also reviewed. The risks                            and benefits of the procedure and the sedation                            options and risks were discussed with the patient.                            All questions were answered, and informed consent                            was obtained. Prior Anticoagulants: The patient has                            taken Eliquis  (apixaban ), last dose was 3 days                            prior to procedure. ASA Grade Assessment: III - A  patient with severe systemic disease. After                            reviewing the risks and benefits, the patient was                            deemed in satisfactory condition to undergo the                            procedure.                           - Prior to the procedure, a History and Physical                            was  performed, and patient medications and                            allergies were reviewed. The patient's tolerance of                            previous anesthesia was also reviewed. The risks                            and benefits of the procedure and the sedation                            options and risks were discussed with the patient.                            All questions were answered, and informed consent                            was obtained. Prior Anticoagulants: The patient has                            taken Eliquis  (apixaban ), last dose was 3 days                            prior to procedure. ASA Grade Assessment: III - A                            patient with severe systemic disease. After                            reviewing the risks and benefits, the patient was                            deemed in satisfactory condition to undergo the                            procedure.  After obtaining informed consent, the colonoscope                            was passed under direct vision. Throughout the                            procedure, the patient's blood pressure, pulse, and                            oxygen  saturations were monitored continuously. The                            [id number] ([id number] Olympus colonoscope was                            introduced through the anus and advanced to the the                            terminal ileum, with identification of the                            appendiceal orifice and IC valve. The colonoscopy                            was somewhat difficult due to multiple diverticula                            in the colon, restricted mobility of the colon and                            a tortuous colon. The patient tolerated the                            procedure well. The quality of the bowel                            preparation was good. The terminal ileum, ileocecal                             valve, appendiceal orifice, and rectum were                            photographed. The bowel preparation used was SUPREP                            via split dose instruction. Scope In: 12:54:38 PM Scope Out: 1:24:06 PM Scope Withdrawal Time: 0 hours 22 minutes 42 seconds  Total Procedure Duration: 0 hours 29 minutes 28 seconds  Findings:      The perianal and digital rectal examinations were normal. Pertinent       negatives include normal sphincter tone and no palpable rectal lesions.      Multiple (15-20) small scattered angioectasias without bleeding were       found in the cecum. Coagulation for tissue destruction using  argon       plasma was successful. Estimated blood loss was minimal.      A 3 mm polyp was found in the sigmoid colon. The polyp was sessile. The       polyp was removed with a cold snare. Resection was complete, but the       polyp tissue was not retrieved (suspect polyp may have fallen into       diverticulum). Estimated blood loss was minimal.      Many large-mouthed and small-mouthed diverticula were found in the       sigmoid colon, descending colon and transverse colon. There was       narrowing of the colon in association with the diverticular opening. The       sigmoid colon was fixed and angulated and difficult to navigate.      The exam was otherwise normal throughout the examined colon.      The terminal ileum appeared normal.      The retroflexed view of the distal rectum and anal verge was normal and       showed no anal or rectal abnormalities. Impression:               - Multiple non-bleeding colonic angioectasias.                            Treated with argon plasma coagulation (APC). THis                            is the most likely source of the patient's iron                            deficiency anemia.                           - One 3 mm polyp in the sigmoid colon, removed with                            a cold snare. Complete resection.  Polyp tissue not                            retrieved.                           - Severe diverticulosis in the sigmoid colon, in                            the descending colon and in the transverse colon.                            There was narrowing of the colon in association                            with the diverticular opening.                           - The examined portion of the ileum was normal.                           -  The distal rectum and anal verge are normal on                            retroflexion view. Moderate Sedation:      Not Applicable - Patient had care per Anesthesia. Recommendation:           - Patient has a contact number available for                            emergencies. The signs and symptoms of potential                            delayed complications were discussed with the                            patient. Return to normal activities tomorrow.                            Written discharge instructions were provided to the                            patient.                           - Resume previous diet.                           - Resume Eliquis  (apixaban ) at prior dose in 3 days                            (Friday, Oct 24).                           - Repeat CBC and iron panel in one month.                           - No repeat colonoscopy due to current age and lack                            of high risk polyps.                           - Recommend daily fiber supplement to reduce risk                            of diverticular complications. Procedure Code(s):        --- Professional ---                           [phone], Colonoscopy, flexible; with ablation of                            tumor(s), polyp(s), or other lesion(s) (includes  pre- and post-dilation and guide wire passage, when                            performed)                           [id number], 59, Colonoscopy, flexible; with removal of                             tumor(s), polyp(s), or other lesion(s) by snare                            technique Diagnosis Code(s):        --- Professional ---                           K55.20, Angiodysplasia of colon without hemorrhage                           D12.5, Benign neoplasm of sigmoid colon                           D50.0, Iron deficiency anemia secondary to blood                            loss (chronic)                           K57.30, Diverticulosis of large intestine without                            perforation or abscess without bleeding CPT copyright 2022 American Medical Association. All rights reserved. The codes documented in this report are preliminary and upon coder review may  be revised to meet current compliance requirements. Kendrea Cerritos E. Stacia, MD 06/14/2024 1:44:37 PM This report has been signed electronically. Number of Addenda: 0

## 2024-06-14 NOTE — Telephone Encounter (Signed)
 Spoke to patient. She no longer has questions. She has completed prep and is leaving shortly to be at the hospital for her procedure.

## 2024-06-14 NOTE — Discharge Instructions (Signed)

## 2024-06-14 NOTE — Anesthesia Preprocedure Evaluation (Addendum)
 Anesthesia Evaluation  Patient identified by MRN, date of birth, ID band Patient awake    Reviewed: Allergy & Precautions, H&P , NPO status , Patient's Chart, lab work & pertinent test results  History of Anesthesia Complications (+) PONV and history of anesthetic complications  Airway Mallampati: II  TM Distance: >3 FB Neck ROM: Full    Dental no notable dental hx.    Pulmonary neg shortness of breath, sleep apnea and Continuous Positive Airway Pressure Ventilation , former smoker   Pulmonary exam normal breath sounds clear to auscultation       Cardiovascular hypertension, (-) angina (-) Past MI Normal cardiovascular exam+ dysrhythmias Atrial Fibrillation  Rhythm:Regular Rate:Normal  Carotid stenosis   Neuro/Psych negative neurological ROS  negative psych ROS   GI/Hepatic Neg liver ROS,GERD  ,,iron deficiency anemia   Endo/Other  diabetes, Type 2    Renal/GU negative Renal ROS  negative genitourinary   Musculoskeletal  (+) Arthritis ,    Abdominal   Peds negative pediatric ROS (+)  Hematology  (+) Blood dyscrasia, anemia   Anesthesia Other Findings   Reproductive/Obstetrics negative OB ROS                              Anesthesia Physical Anesthesia Plan  ASA: 3  Anesthesia Plan: MAC   Post-op Pain Management:    Induction: Intravenous  PONV Risk Score and Plan: 3 and Propofol  infusion and Treatment may vary due to age or medical condition  Airway Management Planned: Natural Airway  Additional Equipment:   Intra-op Plan:   Post-operative Plan:   Informed Consent: I have reviewed the patients History and Physical, chart, labs and discussed the procedure including the risks, benefits and alternatives for the proposed anesthesia with the patient or authorized representative who has indicated his/her understanding and acceptance.     Dental advisory given  Plan  Discussed with: CRNA  Anesthesia Plan Comments:          Anesthesia Quick Evaluation

## 2024-06-14 NOTE — H&P (Signed)
 [facility] History and Physical   Primary Care Physician:  Dwight Trula SQUIBB, MD   Reason for Procedure:   Iron deficiency anemia  Plan:    EGD, colonoscopy     HPI: Jenna Mcdonald is a 78 y.o. female undergoing EGD and colonoscopy to evaluate iron deficiency anemia.  She has a history of colonic AVMs s/p ablation and duodenal ulcers.  She denies any overt GI bleeding or other GI symptoms.  She takes Eliquis  for A-fib, last dose 3 days ago.    Past Medical History:  Diagnosis Date   Anemia    Aortic stenosis    mild by echo 09/2020 and 08/2022 with mean AVG   Breast CA (HCC)    left   Breast cancer (HCC)    Carotid stenosis    1-39% left   Degenerative disc disease, lumbar    of the knees   Diabetes mellitus    Dysrhythmia    Empty sella syndrome    Encounter for therapeutic drug monitoring 08/17/2018   Fatigue    Severe secondary to to gemfibrozil   Gastroesophageal reflux disease    denies   GI bleeding    a. 06/2018: EGD showed multiple nonbleeding duodenal ulcers. 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   Hammertoe    left second toe   Hypercholesteremia    Hypertension    Insomnia    Irritable bowel    Low back pain    Metabolic syndrome    Morbid obesity (HCC)    OSA on CPAP \   bipap   Osteoarthritis    Osteopenia    Persistent atrial fibrillation [facility])    s/p afib ablation - Dr. Kelsie 08/2018   Personal history of radiation therapy    Pneumonia    PONV (postoperative nausea and vomiting)    Sigmoid diverticulosis    Thalassemia minor    Unstable angina (HCC) 10/23/2020   Vitamin D  deficiency     Past Surgical History:  Procedure Laterality Date   ATRIAL FIBRILLATION ABLATION N/A 09/21/2018   Procedure: ATRIAL FIBRILLATION ABLATION;  Surgeon: Kelsie Agent, MD;  Location: [facility] INVASIVE CV LAB;  Service: Cardiovascular;  Laterality: N/A;   BIOPSY  07/06/2018    Procedure: BIOPSY;  Surgeon: Rosalie Kitchens, MD;  Location: [facility] ENDOSCOPY;  Service: Endoscopy;;   BREAST LUMPECTOMY Left 05/28/2009   BREAST SURGERY Left 2010   lumpectomy   CARDIOVERSION N/A 07/07/2018   Procedure: CARDIOVERSION;  Surgeon: Raford Riggs, MD;  Location: [facility] ENDOSCOPY;  Service: Cardiovascular;  Laterality: N/A;   CARDIOVERSION N/A 07/14/2018   Procedure: CARDIOVERSION;  Surgeon: Rolan Ezra RAMAN, MD;  Location: [facility] ENDOSCOPY;  Service: Cardiovascular;  Laterality: N/A;   CESAREAN SECTION     COLONOSCOPY WITH PROPOFOL  N/A 07/06/2018   Procedure: COLONOSCOPY WITH PROPOFOL ;  Surgeon: Rosalie Kitchens, MD;  Location: [facility] ENDOSCOPY;  Service: Endoscopy;  Laterality: N/A;   CYSTECTOMY  1970   pilonidal    DILATION AND CURETTAGE OF UTERUS     ESOPHAGOGASTRODUODENOSCOPY (EGD) WITH PROPOFOL  N/A 07/06/2018   Procedure: ESOPHAGOGASTRODUODENOSCOPY (EGD) WITH PROPOFOL ;  Surgeon: Rosalie Kitchens, MD;  Location: [facility] ENDOSCOPY;  Service: Endoscopy;  Laterality: N/A;   EYE SURGERY Bilateral 07/2013   cataracts   HOT HEMOSTASIS N/A 07/06/2018   Procedure: HOT HEMOSTASIS (ARGON PLASMA COAGULATION/BICAP);  Surgeon: Rosalie Kitchens, MD;  Location: [facility] ENDOSCOPY;  Service: Endoscopy;  Laterality: N/A;   implantable loop recorder placement  01/15/2022   [employer]  Reveal Linq model [id number] implantable loop recorder (SN [id number]) implanted for AF management by Dr Kelsie   MAXIMUM ACCESS (MAS)POSTERIOR LUMBAR INTERBODY FUSION (PLIF) 2 LEVEL N/A 09/22/2013   Procedure: FOR MAXIMUM ACCESS  POSTERIOR LUMBAR INTERBODY FUSION LUMBAR THREE-FOUR FOUR-FIVE;  Surgeon: Alm GORMAN Molt, MD;  Location: [facility] NEURO ORS;  Service: Neurosurgery;  Laterality: N/A;   OOPHORECTOMY     wedge section not rem   REPLACEMENT TOTAL KNEE Left 2011   REPLACEMENT TOTAL KNEE     RIGHT/LEFT HEART CATH AND CORONARY ANGIOGRAPHY N/A 10/26/2020   Procedure: RIGHT/LEFT HEART CATH AND CORONARY ANGIOGRAPHY;  Surgeon: Anner Alm ORN, MD;  Location:  [facility] INVASIVE CV LAB;  Service: Cardiovascular;  Laterality: N/A;   TEE WITHOUT CARDIOVERSION N/A 07/07/2018   Procedure: TRANSESOPHAGEAL ECHOCARDIOGRAM (TEE);  Surgeon: Raford Riggs, MD;  Location: [facility] ENDOSCOPY;  Service: Cardiovascular;  Laterality: N/A;   TEE WITHOUT CARDIOVERSION N/A 09/21/2018   Procedure: TRANSESOPHAGEAL ECHOCARDIOGRAM (TEE);  Surgeon: Alveta Aleene PARAS, MD;  Location: [facility] ENDOSCOPY;  Service: Cardiovascular;  Laterality: N/A;   TONSILLECTOMY     TUBAL LIGATION      Prior to Admission medications   Medication Sig Start Date End Date Taking? Authorizing Provider  bismuth subsalicylate (PEPTO BISMOL) 262 MG chewable tablet Chew 262-524 mg by mouth 3 (three) times daily as needed for indigestion.   Yes [provider]  Calcium  Carbonate-Vitamin D  (CALCIUM  600 + D PO) Take 1 tablet by mouth daily.    Yes [provider]  Cholecalciferol (VITAMIN D3) 25 MCG (1000 UT) CAPS Take 1,000 Units by mouth every morning.   Yes [provider]  diltiazem  (CARDIZEM  CD) 120 MG 24 hr capsule Take 1 capsule (120 mg total) by mouth daily. 01/14/24  Yes Turner, Wilbert SAUNDERS, MD  diphenhydramine -acetaminophen  (TYLENOL  PM) 25-500 MG TABS tablet Take 2 tablets by mouth at bedtime as needed (sleep).   Yes [provider]  ferrous sulfate 325 (65 FE) MG EC tablet Take 325 mg by mouth 3 (three) times daily with meals. Patient taking differently: Take 325 mg by mouth 3 (three) times daily with meals. Every other day   Yes [provider]  Magnesium  Glycinate 100 MG CAPS Take 200 mg by mouth at bedtime as needed (for muscle cramps). Can increase to 400 mg if needed. Patient taking differently: Take 200 mg by mouth at bedtime as needed (for muscle cramps). Can increase to 400 mg if needed. Taking twice a day 09/24/23  Yes Turner, Wilbert SAUNDERS, MD  zolpidem  (AMBIEN ) 5 MG tablet Take 5 mg by mouth at bedtime as needed for sleep.   Yes [provider]  apixaban   (ELIQUIS ) 5 MG TABS tablet Take 1 tablet (5 mg total) by mouth 2 (two) times daily. 05/05/24   Mealor, Eulas BRAVO, MD  [facility] ULTRA test strip 1 each by Other route daily. 04/22/19   [provider]  tirzepatide  (MOUNJARO ) 7.5 MG/0.5ML Pen Inject 7.5 mg into the skin once a week. 02/24/24       Current Facility-Administered Medications  Medication Dose Route Frequency Provider Last Rate Last Admin   0.9 %  sodium chloride  infusion   Intravenous Continuous Stacia Glendia BRAVO, MD       0.9 %  sodium chloride  infusion    Continuous PRN Stacia Glendia BRAVO, MD 10 mL/hr at 06/14/24 1209 Continued from Pre-op at 06/14/24 1209    Allergies as of 05/03/2024 - Review Complete 05/03/2024  Allergen Reaction Noted   Metoprolol  Shortness  Of Breath, Diarrhea, and Other (See Comments) 09/11/2011   Amiodarone  Other (See Comments) 07/14/2022   Anastrozole Other (See Comments) 10/31/2021   Calcitriol Other (See Comments) 10/31/2021   Furosemide  Other (See Comments) 10/31/2021   Hydrocodone -acetaminophen  Nausea And Vomiting and Other (See Comments) 09/11/2011   Jardiance  [empagliflozin ] Other (See Comments) 09/12/2021   Pravastatin  Other (See Comments) 04/13/2024   Tamoxifen Other (See Comments) 09/11/2011   Tetracyclines & related Other (See Comments) 09/11/2011   Toprol  xl [metoprolol  tartrate] Other (See Comments) 11/17/2013   Verapamil  Other (See Comments) 05/17/2014   Vicodin [hydrocodone -acetaminophen ] Nausea And Vomiting and Other (See Comments) 09/11/2011   Aromasin [exemestane] Other (See Comments) 11/17/2013   Gemfibrozil Other (See Comments) 11/17/2013   Other Other (See Comments) 05/17/2014    Family History  Problem Relation Age of Onset   Anesthesia problems Mother    Diabetes Mother    Diabetes Father    Colon cancer Neg Hx    Esophageal cancer Neg Hx    Stomach cancer Neg Hx     Social History   Socioeconomic History   Marital status: Widowed    Spouse name: Not on  file   Number of children: Not on file   Years of education: Not on file   Highest education level: Not on file  Occupational History   Not on file  Tobacco Use   Smoking status: Former    Current packs/day: 0.00    Average packs/day: 0.5 packs/day for 10.0 years (5.0 ttl pk-yrs)    Types: Cigarettes    Start date: 11/11/1993    Quit date: 11/12/2003    Years since quitting: 20.6   Smokeless tobacco: Never   Tobacco comments:    Former smoker 05/13/23  Vaping Use   Vaping status: Never Used  Substance and Sexual Activity   Alcohol use: Yes    Comment: occasionally   Drug use: No   Sexual activity: Not Currently    Birth control/protection: Post-menopausal  Other Topics Concern   Not on file  Social History Narrative   Not on file   Social Drivers of Health   Financial Resource Strain: Not on file  Food Insecurity: No Food Insecurity (02/17/2023)   Hunger Vital Sign    Worried About Running Out of Food in the Last Year: Never true    Ran Out of Food in the Last Year: Never true  Transportation Needs: No Transportation Needs (02/17/2023)   PRAPARE - Administrator, Civil Service (Medical): No    Lack of Transportation (Non-Medical): No  Physical Activity: Not on file  Stress: Not on file  Social Connections: Not on file  Intimate Partner Violence: Not At Risk (02/17/2023)   Humiliation, Afraid, Rape, and Kick questionnaire    Fear of Current or Ex-Partner: No    Emotionally Abused: No    Physically Abused: No    Sexually Abused: No    Review of Systems:  All other review of systems negative except as mentioned in the HPI.  Physical Exam: Vital signs BP (!) 158/96   Pulse 76   Temp (!) 97.3 F (36.3 C) (Temporal)   Resp 18   Ht 5' 4 (1.626 m)   Wt 94.3 kg   SpO2 98%   BMI 35.70 kg/m   General:   Alert,  Well-developed, well-nourished, pleasant and cooperative in NAD Airway:  Mallampati 3 Lungs:  Clear throughout to auscultation.   Heart:   Regular rate and rhythm; Systolic murmur 3/6  Abdomen:  Soft, nontender and nondistended. Normal bowel sounds.   Neuro/Psych:  Normal mood and affect. A and O x 3   Averie Meiner E. Stacia, MD [facility]

## 2024-06-14 NOTE — Transfer of Care (Signed)
 Immediate Anesthesia Transfer of Care Note  Patient: Jenna Mcdonald  Procedure(s) Performed: COLONOSCOPY EGD (ESOPHAGOGASTRODUODENOSCOPY)  Patient Location: Endoscopy Unit  Anesthesia Type:MAC  Level of Consciousness: sedated  Airway & Oxygen  Therapy: Patient Spontanous Breathing and Patient connected to face mask oxygen   Post-op Assessment: Report given to RN and Post -op Vital signs reviewed and stable  Post vital signs: Reviewed and stable  Last Vitals:  Vitals Value Taken Time  BP    Temp    Pulse    Resp    SpO2      Last Pain:  Vitals:   06/14/24 1112  TempSrc: Temporal  PainSc: 0-No pain         Complications: No notable events documented.

## 2024-06-15 ENCOUNTER — Encounter (HOSPITAL_COMMUNITY): Payer: Self-pay | Admitting: Gastroenterology

## 2024-06-15 LAB — SURGICAL PATHOLOGY

## 2024-06-16 ENCOUNTER — Ambulatory Visit: Payer: Self-pay | Admitting: Gastroenterology

## 2024-06-16 DIAGNOSIS — K5520 Angiodysplasia of colon without hemorrhage: Secondary | ICD-10-CM

## 2024-06-16 DIAGNOSIS — D509 Iron deficiency anemia, unspecified: Secondary | ICD-10-CM

## 2024-06-16 NOTE — Progress Notes (Signed)
 Remote Loop Recorder Transmission

## 2024-06-16 NOTE — Progress Notes (Signed)
 Jenna Mcdonald,  The biopsies taken from your duodenum showed benign inflammatory changes, but no evidence of celiac disease.  This can often be related to medications such as NSAIDs. The biopsies taken from your stomach were notable for mild chronic gastritis (inflammation) which is a common finding, but there was no evidence of Helicobacter pylori infection. This common finding is not likely to explain abdominal pain and there is no specific treatment or further evaluation recommended.  Let's plan to repeat a blood count and iron panel in month as discussed.   Team,  Please place order for CBC and iron panel in 1 month.

## 2024-06-20 ENCOUNTER — Ambulatory Visit: Payer: Self-pay | Admitting: Cardiovascular Disease

## 2024-06-21 ENCOUNTER — Ambulatory Visit: Admitting: Neurology

## 2024-06-22 ENCOUNTER — Telehealth: Payer: Self-pay | Admitting: Gastroenterology

## 2024-06-22 NOTE — Telephone Encounter (Signed)
 Inbound call from patient requesting to speak to the nurse in regards to her making a future appointment. Please advise.

## 2024-06-22 NOTE — Telephone Encounter (Signed)
 Patient calls with questions about the next step in her care for IDA. She is advised that we would like her to remain on iron supplementation OTC and come back in 1 month for updated CBC, IBC studies to assess if they are improving or if we need to complete additional testing. Patient states that she is very frustrated because she has had this issue for 6 months and is so tired with no help. Patient denies any visible blood per rectum but states her stools are always very dark since taking iron supplementation. Denies any abdominal pain.

## 2024-06-22 NOTE — Telephone Encounter (Signed)
 Attempted to reach patient. Phone rings continuously and no voicemail is available.

## 2024-06-22 NOTE — Telephone Encounter (Signed)
 PT returning call. Stated that she will be leaving home at 1pm to run errands.

## 2024-06-28 NOTE — Telephone Encounter (Signed)
 Spoke with patient to offer IV iron which may make her feel better more quickly following her recent complaints of fatigue. Patient says she only finds it logical to retest iron studies before deciding to move forward with IV iron because she says maybe my iron tests have improved following my procedures and I don't even need iron anymore.  Note the original plan was for CBC, IBC recheck around 07/17/24 (1 month after endoscopic procedures).  Patient also says she wants to know when Dr Stacia wants to see her back in the office following her procedures. If he doesn't think I need to be seen, then that is fine. I will just follow with someone else about the problem.

## 2024-06-29 NOTE — Telephone Encounter (Signed)
 Spoke with patient to discuss Dr London response/recommendation and his willingness to recheck IBC/ferritin studies prior to moving forward with any IV iron if she would like.   Patient states that she is going to have to think about this. I may call my GP.  Advised patient to reach back out to us  should she decide to move forward with repeat testing/IV iron/office visit for additional discussion of her IDA.

## 2024-07-06 DIAGNOSIS — D508 Other iron deficiency anemias: Secondary | ICD-10-CM | POA: Diagnosis not present

## 2024-07-06 DIAGNOSIS — N1831 Chronic kidney disease, stage 3a: Secondary | ICD-10-CM | POA: Diagnosis not present

## 2024-07-06 DIAGNOSIS — E1169 Type 2 diabetes mellitus with other specified complication: Secondary | ICD-10-CM | POA: Diagnosis not present

## 2024-07-06 DIAGNOSIS — E1122 Type 2 diabetes mellitus with diabetic chronic kidney disease: Secondary | ICD-10-CM | POA: Diagnosis not present

## 2024-07-11 ENCOUNTER — Ambulatory Visit (INDEPENDENT_AMBULATORY_CARE_PROVIDER_SITE_OTHER)

## 2024-07-11 DIAGNOSIS — I493 Ventricular premature depolarization: Secondary | ICD-10-CM | POA: Diagnosis not present

## 2024-07-11 LAB — CUP PACEART REMOTE DEVICE CHECK
Date Time Interrogation Session: 20251116232426
Implantable Pulse Generator Implant Date: 20230524

## 2024-07-13 NOTE — Progress Notes (Signed)
 Remote Loop Recorder Transmission

## 2024-07-15 ENCOUNTER — Encounter: Payer: Self-pay | Admitting: *Deleted

## 2024-07-25 ENCOUNTER — Ambulatory Visit: Payer: Self-pay | Admitting: Cardiovascular Disease

## 2024-07-26 ENCOUNTER — Ambulatory Visit
Admission: RE | Admit: 2024-07-26 | Discharge: 2024-07-26 | Disposition: A | Source: Ambulatory Visit | Attending: Internal Medicine | Admitting: Internal Medicine

## 2024-07-26 DIAGNOSIS — Z1231 Encounter for screening mammogram for malignant neoplasm of breast: Secondary | ICD-10-CM | POA: Diagnosis not present

## 2024-07-26 DIAGNOSIS — Z0000 Encounter for general adult medical examination without abnormal findings: Secondary | ICD-10-CM

## 2024-08-05 ENCOUNTER — Encounter: Payer: Self-pay | Admitting: Cardiology

## 2024-08-09 LAB — LAB REPORT - SCANNED
A1c: 6.9
EGFR: 48

## 2024-08-11 ENCOUNTER — Encounter

## 2024-08-11 DIAGNOSIS — I493 Ventricular premature depolarization: Secondary | ICD-10-CM

## 2024-08-11 LAB — CUP PACEART REMOTE DEVICE CHECK
Date Time Interrogation Session: 20251217232441
Implantable Pulse Generator Implant Date: 20230524

## 2024-08-15 NOTE — Progress Notes (Signed)
 Remote Loop Recorder Transmission

## 2024-08-22 ENCOUNTER — Other Ambulatory Visit (HOSPITAL_BASED_OUTPATIENT_CLINIC_OR_DEPARTMENT_OTHER): Payer: Self-pay

## 2024-08-22 MED ORDER — MOUNJARO 7.5 MG/0.5ML ~~LOC~~ SOAJ
7.5000 mg | SUBCUTANEOUS | 1 refills | Status: AC
  Filled 2024-08-22: qty 6, 84d supply, fill #0

## 2024-08-23 ENCOUNTER — Other Ambulatory Visit (HOSPITAL_BASED_OUTPATIENT_CLINIC_OR_DEPARTMENT_OTHER): Payer: Self-pay

## 2024-08-24 ENCOUNTER — Ambulatory Visit: Payer: Self-pay | Admitting: Cardiovascular Disease

## 2024-09-11 ENCOUNTER — Ambulatory Visit

## 2024-09-11 DIAGNOSIS — I493 Ventricular premature depolarization: Secondary | ICD-10-CM

## 2024-09-12 LAB — CUP PACEART REMOTE DEVICE CHECK
Date Time Interrogation Session: 20260117232921
Implantable Pulse Generator Implant Date: 20230524

## 2024-09-15 ENCOUNTER — Telehealth: Payer: Self-pay | Admitting: Cardiology

## 2024-09-15 NOTE — Telephone Encounter (Signed)
 Pt needs to reschedule Echo on Monday. Order expires 09/21/24. Could we get this extended so we can reschedule the pt?    Please advise.

## 2024-09-15 NOTE — Telephone Encounter (Signed)
 Echo order expiration has been extended for 6 months.  Will forward to scheduler to contact patient to schedule appt for echo.

## 2024-09-16 NOTE — Progress Notes (Signed)
 Remote Loop Recorder Transmission

## 2024-09-19 ENCOUNTER — Ambulatory Visit (HOSPITAL_COMMUNITY): Payer: Medicare PPO

## 2024-09-20 ENCOUNTER — Ambulatory Visit: Payer: Self-pay | Admitting: Cardiovascular Disease

## 2024-09-21 ENCOUNTER — Other Ambulatory Visit: Payer: Self-pay

## 2024-09-21 DIAGNOSIS — I350 Nonrheumatic aortic (valve) stenosis: Secondary | ICD-10-CM

## 2024-10-12 ENCOUNTER — Encounter

## 2024-10-18 ENCOUNTER — Ambulatory Visit (HOSPITAL_COMMUNITY)

## 2024-11-12 ENCOUNTER — Ambulatory Visit

## 2024-12-13 ENCOUNTER — Ambulatory Visit

## 2024-12-14 ENCOUNTER — Ambulatory Visit: Admitting: Cardiology

## 2025-01-13 ENCOUNTER — Ambulatory Visit

## 2025-02-13 ENCOUNTER — Ambulatory Visit

## 2025-03-16 ENCOUNTER — Ambulatory Visit

## 2025-04-16 ENCOUNTER — Ambulatory Visit

## 2025-05-17 ENCOUNTER — Ambulatory Visit

## 2025-06-17 ENCOUNTER — Ambulatory Visit

## 2025-07-18 ENCOUNTER — Ambulatory Visit

## 2025-08-18 ENCOUNTER — Ambulatory Visit

## 2025-09-18 ENCOUNTER — Ambulatory Visit
